# Patient Record
Sex: Female | Born: 1990 | Race: Black or African American | Hispanic: No | Marital: Single | Smoking: Never smoker
Health system: Southern US, Community
[De-identification: ages and names within clinical notes are randomized; demographics above are authoritative.]

## PROBLEM LIST (undated history)

## (undated) ENCOUNTER — Inpatient Hospital Stay (HOSPITAL_COMMUNITY): Payer: Self-pay

## (undated) ENCOUNTER — Inpatient Hospital Stay (HOSPITAL_COMMUNITY): Payer: Medicaid Other

## (undated) DIAGNOSIS — Z9889 Other specified postprocedural states: Secondary | ICD-10-CM

## (undated) DIAGNOSIS — E119 Type 2 diabetes mellitus without complications: Secondary | ICD-10-CM

## (undated) DIAGNOSIS — Z202 Contact with and (suspected) exposure to infections with a predominantly sexual mode of transmission: Secondary | ICD-10-CM

## (undated) DIAGNOSIS — O24419 Gestational diabetes mellitus in pregnancy, unspecified control: Secondary | ICD-10-CM

## (undated) DIAGNOSIS — R519 Headache, unspecified: Secondary | ICD-10-CM

## (undated) DIAGNOSIS — A749 Chlamydial infection, unspecified: Secondary | ICD-10-CM

## (undated) DIAGNOSIS — R51 Headache: Secondary | ICD-10-CM

## (undated) DIAGNOSIS — K5909 Other constipation: Secondary | ICD-10-CM

## (undated) DIAGNOSIS — D649 Anemia, unspecified: Secondary | ICD-10-CM

## (undated) DIAGNOSIS — F458 Other somatoform disorders: Secondary | ICD-10-CM

## (undated) DIAGNOSIS — F32A Depression, unspecified: Secondary | ICD-10-CM

## (undated) DIAGNOSIS — O139 Gestational [pregnancy-induced] hypertension without significant proteinuria, unspecified trimester: Secondary | ICD-10-CM

## (undated) DIAGNOSIS — I10 Essential (primary) hypertension: Secondary | ICD-10-CM

## (undated) DIAGNOSIS — K219 Gastro-esophageal reflux disease without esophagitis: Secondary | ICD-10-CM

## (undated) DIAGNOSIS — R569 Unspecified convulsions: Secondary | ICD-10-CM

## (undated) DIAGNOSIS — F419 Anxiety disorder, unspecified: Secondary | ICD-10-CM

## (undated) DIAGNOSIS — I499 Cardiac arrhythmia, unspecified: Secondary | ICD-10-CM

## (undated) DIAGNOSIS — F329 Major depressive disorder, single episode, unspecified: Secondary | ICD-10-CM

## (undated) DIAGNOSIS — R112 Nausea with vomiting, unspecified: Secondary | ICD-10-CM

## (undated) DIAGNOSIS — T8859XA Other complications of anesthesia, initial encounter: Secondary | ICD-10-CM

## (undated) DIAGNOSIS — F319 Bipolar disorder, unspecified: Secondary | ICD-10-CM

## (undated) DIAGNOSIS — D573 Sickle-cell trait: Secondary | ICD-10-CM

## (undated) DIAGNOSIS — B999 Unspecified infectious disease: Secondary | ICD-10-CM

## (undated) DIAGNOSIS — D219 Benign neoplasm of connective and other soft tissue, unspecified: Secondary | ICD-10-CM

## (undated) DIAGNOSIS — F909 Attention-deficit hyperactivity disorder, unspecified type: Secondary | ICD-10-CM

## (undated) HISTORY — DX: Type 2 diabetes mellitus without complications: E11.9

## (undated) HISTORY — PX: NASAL SEPTUM SURGERY: SHX37

## (undated) HISTORY — DX: Other constipation: K59.09

## (undated) HISTORY — PX: DILATION AND CURETTAGE OF UTERUS: SHX78

## (undated) HISTORY — PX: WISDOM TOOTH EXTRACTION: SHX21

---
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
## 2012-02-01 ENCOUNTER — Encounter (HOSPITAL_COMMUNITY): Payer: Self-pay | Admitting: *Deleted

## 2012-02-01 ENCOUNTER — Inpatient Hospital Stay (HOSPITAL_COMMUNITY)
Admission: AD | Admit: 2012-02-01 | Discharge: 2012-02-01 | Disposition: A | Discharge status: Hospice | Payer: Medicaid Other | Source: Ambulatory Visit | Attending: Obstetrics & Gynecology | Admitting: Obstetrics & Gynecology

## 2012-02-01 DIAGNOSIS — N949 Unspecified condition associated with female genital organs and menstrual cycle: Secondary | ICD-10-CM | POA: Insufficient documentation

## 2012-02-01 DIAGNOSIS — A5400 Gonococcal infection of lower genitourinary tract, unspecified: Secondary | ICD-10-CM

## 2012-02-01 DIAGNOSIS — R109 Unspecified abdominal pain: Secondary | ICD-10-CM | POA: Insufficient documentation

## 2012-02-01 DIAGNOSIS — A749 Chlamydial infection, unspecified: Secondary | ICD-10-CM

## 2012-02-01 DIAGNOSIS — N938 Other specified abnormal uterine and vaginal bleeding: Secondary | ICD-10-CM | POA: Insufficient documentation

## 2012-02-01 DIAGNOSIS — Z202 Contact with and (suspected) exposure to infections with a predominantly sexual mode of transmission: Secondary | ICD-10-CM | POA: Insufficient documentation

## 2012-02-01 HISTORY — DX: Contact with and (suspected) exposure to infections with a predominantly sexual mode of transmission: Z20.2

## 2012-02-01 LAB — POCT PREGNANCY, URINE: Preg Test, Ur: NEGATIVE

## 2012-02-01 LAB — CBC
HCT: 31.1 % — ABNORMAL LOW (ref 36.0–46.0)
Hemoglobin: 10.1 g/dL — ABNORMAL LOW (ref 12.0–15.0)
RBC: 3.91 MIL/uL (ref 3.87–5.11)
WBC: 7 10*3/uL (ref 4.0–10.5)

## 2012-02-01 LAB — URINALYSIS, ROUTINE W REFLEX MICROSCOPIC
Glucose, UA: 250 mg/dL — AB
Ketones, ur: 40 mg/dL — AB
Nitrite: POSITIVE — AB
Specific Gravity, Urine: 1.02 (ref 1.005–1.030)
pH: 5 (ref 5.0–8.0)

## 2012-02-01 LAB — URINE MICROSCOPIC-ADD ON

## 2012-02-01 MED ORDER — AZITHROMYCIN 250 MG PO TABS
1000.0000 mg | ORAL_TABLET | Freq: Once | ORAL | Status: AC
  Administered 2012-02-01: 1000 mg via ORAL
  Filled 2012-02-01: qty 4

## 2012-02-01 MED ORDER — CEFTRIAXONE SODIUM 250 MG IJ SOLR
250.0000 mg | Freq: Once | INTRAMUSCULAR | Status: AC
  Administered 2012-02-01: 250 mg via INTRAMUSCULAR
  Filled 2012-02-01: qty 250

## 2012-02-01 NOTE — MAU Provider Note (Signed)
History     CSN: [id number]  Arrival date and time: 02/01/12 1200   First Provider Initiated Contact with Patient 02/01/12 1320      Chief Complaint  Patient presents with  . Vaginal Bleeding  . Abdominal Pain   Vaginal Bleeding Associated symptoms include abdominal pain, nausea (decreased appetite) and vomiting (>1x daily, difficulty keeping food down). Pertinent negatives include no chills, constipation, diarrhea, dysuria, fever, frequency or headaches.  Abdominal Pain Associated symptoms include nausea (decreased appetite) and vomiting (>1x daily, difficulty keeping food down). Pertinent negatives include no constipation, diarrhea, dysuria, fever, frequency or headaches. Weight loss: +weight gain.    Stephanie Mack 21 yo F G1P0010 w/ significant psychiatric hx (followed by [facility]), currently sexually active and not on contraception, who presents with vaginal bleeding and passing clots.  1. Vaginal Bleeding - LMP about 1 mo ago. Started bleeding on 01/27/12 similar to regular period for about 3 days, followed passing heavy bleeding (soaked 5 pads per day) with large dark clots about the size of her fist. Bleeding is fairly constant, worsened by coughing, standing-up, and after sex. Menstrual history of periods w/ regular bleeding w/o clots x7 days.  2. Hx recent GC/Chlamydia - [facility]. Pt reports having sex since being treated.  3. OB Hx - Currently no contraception. Was on Depo > 20yr ago. 1 pregnancy resulted in miscarriage at 6 weeks w/ empty gestational sac, passed clots/products of conception.  ROS: Admits to +weakness, +dizziness, +lightheadedness, +nausea/vomiting (>1x daily) and unable to keep food down, +decreased appetite, +lower abd pain.  Denies any fever, chills, CP, SoB, dysuria, urinary frequency, constipation/diarrhea.    Past Medical History  Diagnosis Date  . Depression     depression  .  Bipolar disorder   . ADHD (attention deficit hyperactivity disorder)   . Anxiety   . Anemia   . ADHD (attention deficit hyperactivity disorder)   . Bipolar 1 disorder   . Chlamydia 05-31-10  . Pseudocyesis 2013    Seen in MAU for percieved FM, abd distension. Normal exam.   . Gonorrhea contact, treated     Past Surgical History  Procedure Date  . No past surgeries   . Nasal septum surgery     Family History  Problem Relation Age of Onset  . Kidney disease Mother   . Hypertension Father   . Cancer Sister   . Asthma Brother     History  Substance Use Topics  . Smoking status: Never Smoker   . Smokeless tobacco: Never Used  . Alcohol Use: No    Allergies: No Known Allergies  Prescriptions prior to admission  Medication Sig Dispense Refill  . acetaminophen (TYLENOL) 325 MG tablet Take 325 mg by mouth every 6 (six) hours as needed. For pain      . ARIPiprazole (ABILIFY) 2 MG tablet Take 2 mg by mouth 2 (two) times daily.       . mirtazapine (REMERON) 30 MG tablet Take 60 mg by mouth at bedtime.         Review of Systems  Constitutional: Positive for malaise/fatigue. Negative for fever and chills. Weight loss: +weight gain.  Eyes: Negative for blurred vision and double vision.  Respiratory: Negative for cough and shortness of breath.   Cardiovascular: Negative for chest pain.  Gastrointestinal: Positive for nausea (decreased appetite), vomiting (>1x daily, difficulty keeping food down) and abdominal pain. Negative for diarrhea and constipation.  Genitourinary: Positive for  vaginal bleeding. Negative for dysuria and frequency.  Neurological: Positive for dizziness (+lightheadedness) and weakness. Negative for headaches.   Physical Exam   Blood pressure 121/84, pulse 119, temperature 98.1 F (36.7 C), temperature source Oral, resp. rate 16, height 5\' 1"  (1.549 m), weight 47.809 kg (105 lb 6.4 oz), last menstrual period 01/02/2012, SpO2 99.00%.  Physical Exam  General  appearance: alert, cooperative and no distress Lungs: clear to auscultation bilaterally Heart: regular rate and rhythm, S1, S2 normal, no murmur, click, rub or gallop Abdomen: firm, mildly distended, tender to deep palpation RLQ, no rebound, Rovsing negative, Psoas negative Pelvic: cervix normal in appearance, external genitalia normal, no adnexal masses or tenderness, no cervical motion tenderness, rectovaginal septum normal, uterus normal size, shape, and consistency and vagina normal without discharge. No active bleeding seen. Small amount of blood pooled in vaginal vault, but no signs of bleeding from cervical os. No signs of vaginal trauma or lacerations.  Extremities: extremities normal, atraumatic, no cyanosis or edema Pulses: 2+ and symmetric Skin: Skin color, texture, turgor normal. No rashes or lesions  MAU Course  Procedures  MDM 21 yo F G1P0010, sexually active w/o contraception. Reports of bleeding/large clots. Urine pregnancy test negative. Pelvic exam showed no active bleeding. LMP about 1 month ago. Bleeding seems consistent with heavy menstrual period, does not appear to be excessive or abnormal.  Results for orders placed during the hospital encounter of 02/01/12 (from the past 24 hour(s))  URINALYSIS, ROUTINE W REFLEX MICROSCOPIC     Status: Abnormal   Collection Time   02/01/12 12:30 PM      Component Value Range   Color, Urine RED (*) YELLOW   APPearance CLOUDY (*) CLEAR   Specific Gravity, Urine 1.020  1.005 - 1.030   pH 5.0  5.0 - 8.0   Glucose, UA 250 (*) NEGATIVE mg/dL   Hgb urine dipstick LARGE (*) NEGATIVE   Bilirubin Urine NEGATIVE  NEGATIVE   Ketones, ur 40 (*) NEGATIVE mg/dL   Protein, ur >960 (*) NEGATIVE mg/dL   Urobilinogen, UA 4.0 (*) 0.0 - 1.0 mg/dL   Nitrite POSITIVE (*) NEGATIVE   Leukocytes, UA MODERATE (*) NEGATIVE  URINE MICROSCOPIC-ADD ON     Status: Normal   Collection Time   02/01/12 12:30 PM      Component Value Range   WBC, UA 0-2  <3  WBC/hpf   RBC / HPF TOO NUMEROUS TO COUNT  <3 RBC/hpf   Urine-Other MICROSCOPIC EXAM PERFORMED ON UNCONCENTRATED URINE    CBC     Status: Abnormal   Collection Time   02/01/12  2:05 PM      Component Value Range   WBC 7.0  4.0 - 10.5 K/uL   RBC 3.91  3.87 - 5.11 MIL/uL   Hemoglobin 10.1 (*) 12.0 - 15.0 g/dL   HCT 45.4 (*) 09.8 - 11.9 %   MCV 79.5  78.0 - 100.0 fL   MCH 25.8 (*) 26.0 - 34.0 pg   MCHC 32.5  30.0 - 36.0 g/dL   RDW 14.7  82.9 - 56.2 %   Platelets 286  150 - 400 K/uL  POCT PREGNANCY, URINE     Status: Normal   Collection Time   02/01/12  2:27 PM      Component Value Range   Preg Test, Ur NEGATIVE  NEGATIVE    Assessment and Plan   Stephanie Mack 62 yo F G1P0010 currently sexually active w/o contraception, who presents with vaginal bleeding and passing  clots.  1. Vaginal Bleeding - diff dx: likely regular menstrual cycle vs. Possible miscarriage vs. Possible re-infection GC/Chlamydia - Urine pregnancy negative. LMP about 1 month ago. - pelvic exam: showed no active bleeding. Some pooled blood, but no significant bleeding. Consistent with regular period  2. Abdominal Pain - diff dx: likely menstrual cramping vs. Ruled out ectopic pregnancy - urine pregnancy negative. See above - ibuprofen for pain  3. Hx GC/Chlamydia - treated w/ IM and pill for +Gonorrhea and +Chlyamdia 10/24 at [facility] - pt admits to sexual intercourse yesterday (6 days after completing treatment), and states that partner was not treated. Discussed importance of waiting at least 10 days after both her and partner have been treated - Rocephin IM - Azithromycin PO  4. Contraception: - currently no contraception. Hx of Depo IM >56yr ago - recommended f/u apt with [facility] to discuss birth control options  5.  Urine culture pending.  No complaint of dysuria.  Will treat if culture indicates.  Evaluation and management procedures were performed by the under my supervision  and collaboration. I have seen the client, reviewed the note and chart, and I agree with the management and plan.  Nolene Bernheim, RN, FNP   Harriet Masson 02/01/2012, 3:01 PM

## 2012-02-01 NOTE — MAU Provider Note (Signed)
Attestation of Attending Supervision of Advanced Practitioner (CNM/NP): Evaluation and management procedures were performed by the Advanced Practitioner under my supervision and collaboration.  I have reviewed the Advanced Practitioner's note and chart, and I agree with the management and plan.  Minervia Osso, MD, FACOG Attending Obstetrician & Gynecologist Faculty Practice, [facility]  

## 2012-02-01 NOTE — MAU Note (Signed)
Says she stopped depo one year ago and has not taken any birth control since then. Says she started bleeding with clots two days ago and last had intercourse yesterday.  She says she was in pain & bleeding during sex, and asked the boy to stop and he refused.

## 2012-02-01 NOTE — MAU Note (Signed)
Patient states she has not taken Depo for about one year. States she has only had one period since then. States she started bleeding on 10-28 and is bleeding heavy and passing large clots. Having lower abdominal pain. States she was treated for GC/CT on 10-24 at [facility].

## 2012-02-01 NOTE — MAU Note (Signed)
Pt attempted to obtain a urine sample, but said she was unable to get it into the cup

## 2012-04-23 ENCOUNTER — Inpatient Hospital Stay (HOSPITAL_COMMUNITY)
Admission: AD | Admit: 2012-04-23 | Discharge: 2012-04-23 | Disposition: A | Discharge status: Hospice | Payer: Medicaid Other | Source: Ambulatory Visit | Attending: Obstetrics & Gynecology | Admitting: Obstetrics & Gynecology

## 2012-04-23 ENCOUNTER — Encounter (HOSPITAL_COMMUNITY): Payer: Self-pay | Admitting: *Deleted

## 2012-04-23 DIAGNOSIS — R059 Cough, unspecified: Secondary | ICD-10-CM | POA: Insufficient documentation

## 2012-04-23 DIAGNOSIS — O99891 Other specified diseases and conditions complicating pregnancy: Secondary | ICD-10-CM | POA: Insufficient documentation

## 2012-04-23 DIAGNOSIS — J069 Acute upper respiratory infection, unspecified: Secondary | ICD-10-CM | POA: Insufficient documentation

## 2012-04-23 DIAGNOSIS — R05 Cough: Secondary | ICD-10-CM

## 2012-04-23 LAB — URINALYSIS, ROUTINE W REFLEX MICROSCOPIC
Glucose, UA: NEGATIVE mg/dL
Ketones, ur: 15 mg/dL — AB
Leukocytes, UA: NEGATIVE
Nitrite: NEGATIVE
Protein, ur: NEGATIVE mg/dL
Urobilinogen, UA: 0.2 mg/dL (ref 0.0–1.0)

## 2012-04-23 MED ORDER — SALINE NASAL SPRAY 0.65 % NA SOLN: 1.0000 | NASAL | Status: DC | PRN

## 2012-04-23 MED ORDER — GUAIFENESIN-CODEINE 100-10 MG/5ML PO SYRP: 5.0000 mL | ORAL_SOLUTION | Freq: Every evening | ORAL | Status: DC | PRN

## 2012-04-23 NOTE — MAU Note (Signed)
Chest hurts when she coughs, past couple. Threw up one time this monring.  Cough is non- productive.  Denies fever or sore throat.

## 2012-04-23 NOTE — MAU Note (Signed)
Mask on at front desk when registered.  Remains on.

## 2012-04-23 NOTE — MAU Note (Signed)
Pt started with a cough 4 days ago, and vomiting started this morning.  Says she is able to keep food down.  Denies any runny nose, sore throat, fever or diarrhea.  Says her chest hurts sometimes when she coughs.

## 2012-04-23 NOTE — MAU Provider Note (Signed)
Chief Complaint: No chief complaint on file.   First Provider Initiated Contact with Patient 04/23/12 1620     SUBJECTIVE HPI: Stephanie Mack is a 22 y.o. G1P0010 at [redacted]w[redacted]d who presents to maternity admissions reporting cough x2 days keeping her up at night.  She is unsure what medications are safe to take.  Others in her household have had cough and cold symptoms recently.  She has had one prenatal visit with Dr Clearance Coots and her due date is based on LMP.  She has a repeat U/S scheduled to verify her dating for the pregnancy.  She denies sore throat, fever/chills, runny/stuffy nose, h/a, urinary symptoms, vaginal bleeding or abdominal pain.      Past Medical History  Diagnosis Date  . Depression     depression  . Bipolar disorder   . ADHD (attention deficit hyperactivity disorder)   . Anxiety   . Anemia   . ADHD (attention deficit hyperactivity disorder)   . Bipolar 1 disorder   . Chlamydia 05-31-10  . Pseudocyesis 2013    Seen in MAU for percieved FM, abd distension. Normal exam.   . Gonorrhea contact, treated    Past Surgical History  Procedure Date  . No past surgeries   . Nasal septum surgery    History   Social History  . Marital Status: Single    Spouse Name: N/A    Number of Children: N/A  . Years of Education: N/A   Occupational History  . Not on file.   Social History Main Topics  . Smoking status: Never Smoker   . Smokeless tobacco: Never Used  . Alcohol Use: No  . Drug Use: No  . Sexually Active: Not Currently    Birth Control/ Protection: None     Comment: 1 partner   Other Topics Concern  . Not on file   Social History Narrative  . No narrative on file   No current facility-administered medications on file prior to encounter.   Current Outpatient Prescriptions on File Prior to Encounter  Medication Sig Dispense Refill  . acetaminophen (TYLENOL) 325 MG tablet Take 325 mg by mouth every 6 (six) hours as needed. For pain      . mirtazapine (REMERON)  30 MG tablet Take 60 mg by mouth at bedtime.       . ARIPiprazole (ABILIFY) 2 MG tablet Take 2 mg by mouth 2 (two) times daily.        No Known Allergies  ROS: Pertinent items in HPI  OBJECTIVE Blood pressure 116/10, pulse 101, temperature 98.2 F (36.8 C), resp. rate 18, height 5' 1.25" (1.556 m), weight 45.813 kg (101 lb), last menstrual period 02/22/2012, SpO2 100.00%. GENERAL: Well-developed, well-nourished female in no acute distress.  HEENT: Normocephalic HEART: normal rate/rhythm RESP: normal effort, lung sounds clear bilaterally in all lobes, no cough while in MAU ABDOMEN: Soft, non-tender EXTREMITIES: Nontender, no edema NEURO: Alert and oriented SPECULUM EXAM: deferred ASSESSMENT 1. URI (upper respiratory infection)     PLAN Discharge home Robitussin AC QHS PRN Saline Nasal Spray PRN Pt given list of safe medications to take in pregnancy Keep scheduled appointments with Dr Clearance Coots Return to MAU as needed    Medication List     As of 04/23/2012  4:27 PM    ASK your doctor about these medications         acetaminophen 325 MG tablet   Commonly known as: TYLENOL   Take 325 mg by mouth every 6 (six) hours as  needed. For pain      ARIPiprazole 2 MG tablet   Commonly known as: ABILIFY   Take 2 mg by mouth 2 (two) times daily.      mirtazapine 30 MG tablet   Commonly known as: REMERON   Take 60 mg by mouth at bedtime.      prenatal multivitamin Tabs   Take 1 tablet by mouth daily.         Sharen Counter Certified Nurse-Midwife 04/23/2012  4:27 PM

## 2012-05-24 ENCOUNTER — Other Ambulatory Visit (HOSPITAL_COMMUNITY): Payer: Self-pay | Admitting: Obstetrics

## 2012-05-24 DIAGNOSIS — R9389 Abnormal findings on diagnostic imaging of other specified body structures: Secondary | ICD-10-CM

## 2012-05-24 DIAGNOSIS — IMO0002 Reserved for concepts with insufficient information to code with codable children: Secondary | ICD-10-CM

## 2012-05-24 DIAGNOSIS — Z3682 Encounter for antenatal screening for nuchal translucency: Secondary | ICD-10-CM

## 2012-05-25 ENCOUNTER — Encounter: Payer: Self-pay | Admitting: *Deleted

## 2012-05-28 ENCOUNTER — Encounter (HOSPITAL_COMMUNITY): Payer: Self-pay | Admitting: Obstetrics

## 2012-06-05 ENCOUNTER — Ambulatory Visit (HOSPITAL_COMMUNITY)
Admission: RE | Admit: 2012-06-05 | Discharge: 2012-06-05 | Disposition: A | Discharge status: Home / Self Care | Payer: Medicaid Other | Source: Ambulatory Visit | Attending: Obstetrics | Admitting: Obstetrics

## 2012-06-05 ENCOUNTER — Other Ambulatory Visit: Payer: Self-pay

## 2012-06-05 DIAGNOSIS — IMO0002 Reserved for concepts with insufficient information to code with codable children: Secondary | ICD-10-CM

## 2012-06-05 DIAGNOSIS — O3510X0 Maternal care for (suspected) chromosomal abnormality in fetus, unspecified, not applicable or unspecified: Secondary | ICD-10-CM | POA: Insufficient documentation

## 2012-06-05 DIAGNOSIS — Z3682 Encounter for antenatal screening for nuchal translucency: Secondary | ICD-10-CM

## 2012-06-05 DIAGNOSIS — O351XX0 Maternal care for (suspected) chromosomal abnormality in fetus, not applicable or unspecified: Secondary | ICD-10-CM | POA: Insufficient documentation

## 2012-06-05 DIAGNOSIS — R9389 Abnormal findings on diagnostic imaging of other specified body structures: Secondary | ICD-10-CM

## 2012-06-05 DIAGNOSIS — Z3689 Encounter for other specified antenatal screening: Secondary | ICD-10-CM | POA: Insufficient documentation

## 2012-07-02 ENCOUNTER — Inpatient Hospital Stay (HOSPITAL_COMMUNITY)
Admission: AD | Admit: 2012-07-02 | Discharge: 2012-07-02 | Disposition: A | Discharge status: Home / Self Care | Payer: Medicaid Other | Source: Ambulatory Visit | Attending: Obstetrics & Gynecology | Admitting: Obstetrics & Gynecology

## 2012-07-02 DIAGNOSIS — O99891 Other specified diseases and conditions complicating pregnancy: Secondary | ICD-10-CM | POA: Insufficient documentation

## 2012-07-02 DIAGNOSIS — R109 Unspecified abdominal pain: Secondary | ICD-10-CM | POA: Insufficient documentation

## 2012-07-02 NOTE — MAU Note (Signed)
Pt left, informed admissions clerk, did not tell nursing staff.

## 2012-07-02 NOTE — MAU Note (Signed)
Pain on sides bilaterally & upper abdomen only when baby moves.  Denies bleeding or LOF.

## 2012-07-03 ENCOUNTER — Ambulatory Visit (INDEPENDENT_AMBULATORY_CARE_PROVIDER_SITE_OTHER): Payer: Medicaid Other | Admitting: Obstetrics

## 2012-07-03 VITALS — BP 116/77 | Temp 98.9°F | Wt 103.0 lb

## 2012-07-03 DIAGNOSIS — Z3400 Encounter for supervision of normal first pregnancy, unspecified trimester: Secondary | ICD-10-CM

## 2012-07-03 LAB — POCT URINALYSIS DIPSTICK
Glucose, UA: NEGATIVE
Ketones, UA: NEGATIVE
Spec Grav, UA: 1.01
Urobilinogen, UA: NEGATIVE

## 2012-07-03 NOTE — Progress Notes (Signed)
Doing well 

## 2012-07-03 NOTE — Progress Notes (Signed)
Pulse-78 Pt c/o constipation.

## 2012-07-04 LAB — AFP, QUAD SCREEN
Curr Gest Age: 16.2 wks.days
INH: 302.5 pg/mL
MoM for AFP: 0.89
Trisomy 18 (Edward) Syndrome Interp.: 1:2390 {titer}
uE3 Value: 0.5 ng/mL

## 2012-07-09 ENCOUNTER — Inpatient Hospital Stay (HOSPITAL_COMMUNITY)
Admission: AD | Admit: 2012-07-09 | Discharge: 2012-07-09 | Disposition: A | Discharge status: Home / Self Care | Payer: Medicaid Other | Source: Ambulatory Visit | Attending: Obstetrics & Gynecology | Admitting: Obstetrics & Gynecology

## 2012-07-09 ENCOUNTER — Encounter (HOSPITAL_COMMUNITY): Payer: Self-pay

## 2012-07-09 DIAGNOSIS — O99891 Other specified diseases and conditions complicating pregnancy: Secondary | ICD-10-CM | POA: Insufficient documentation

## 2012-07-09 DIAGNOSIS — O9989 Other specified diseases and conditions complicating pregnancy, childbirth and the puerperium: Secondary | ICD-10-CM

## 2012-07-09 DIAGNOSIS — R109 Unspecified abdominal pain: Secondary | ICD-10-CM | POA: Insufficient documentation

## 2012-07-09 DIAGNOSIS — N949 Unspecified condition associated with female genital organs and menstrual cycle: Secondary | ICD-10-CM

## 2012-07-09 DIAGNOSIS — O26899 Other specified pregnancy related conditions, unspecified trimester: Secondary | ICD-10-CM

## 2012-07-09 LAB — URINALYSIS, ROUTINE W REFLEX MICROSCOPIC
Glucose, UA: NEGATIVE mg/dL
Leukocytes, UA: NEGATIVE
Nitrite: NEGATIVE
Protein, ur: NEGATIVE mg/dL

## 2012-07-09 NOTE — MAU Note (Signed)
Patient arrived by EMS. States she had a sudden abdominal pain but has not had any pain since that time. Denies bleeding, vaginal discharge, no leaking. No nausea or vomiting.

## 2012-07-09 NOTE — MAU Provider Note (Signed)
History     CSN: [id number]  Arrival date and time: 07/09/12 1624   First Provider Initiated Contact with Patient 07/09/12 1801      Chief Complaint  Patient presents with  . Abdominal Pain   HPI This is a 22 y.o. female at [redacted]w[redacted]d who presents via EMS with c/o a one-time sharp pain in lower abdomen this morning after having sex. There were no other pains after that. No bleeding. Talked to her mother who was at work who told her to take an ambulance here. Has no complaints now.   Care is done at [facility].  Was last there April 1st.   RN Note: Patient arrived by EMS. States she had a sudden abdominal pain but has not had any pain since that time. Denies bleeding, vaginal discharge, no leaking. No nausea or vomiting.       OB History   Grav Para Term Preterm Abortions TAB SAB Ect Mult Living   2 0 0 0 1 0 1 0 1 0       Past Medical History  Diagnosis Date  . Depression     depression  . Bipolar disorder   . ADHD (attention deficit hyperactivity disorder)   . Anxiety   . Anemia   . ADHD (attention deficit hyperactivity disorder)   . Bipolar 1 disorder   . Chlamydia 05-31-10  . Pseudocyesis 2013    Seen in MAU for percieved FM, abd distension. Normal exam.   . Gonorrhea contact, treated     Past Surgical History  Procedure Laterality Date  . Nasal septum surgery      Family History  Problem Relation Age of Onset  . Kidney disease Mother   . Hypertension Father   . Cancer Sister   . Asthma Brother     History  Substance Use Topics  . Smoking status: Never Smoker   . Smokeless tobacco: Never Used  . Alcohol Use: No    Allergies: No Known Allergies  Prescriptions prior to admission  Medication Sig Dispense Refill  . Prenatal Vit-Fe Fumarate-FA (PRENATAL MULTIVITAMIN) TABS Take 1 tablet by mouth daily.      . sodium chloride (OCEAN NASAL SPRAY) 0.65 % nasal spray Place 1 spray into the nose as needed for congestion.  45 mL  3    Review of Systems   Constitutional: Negative for fever, chills and malaise/fatigue.  Gastrointestinal: Negative for nausea, vomiting, abdominal pain, diarrhea and constipation.  Genitourinary: Negative for dysuria.  Musculoskeletal: Negative for myalgias and back pain.  Neurological: Negative for dizziness, weakness and headaches.   Physical Exam   Blood pressure 125/72, pulse 100, temperature 98.1 F (36.7 C), temperature source Oral, resp. rate 16, height 5\' 1"  (1.549 m), weight 104 lb 3.2 oz (47.265 kg), last menstrual period 03/07/2012, SpO2 100.00%.  Physical Exam  Constitutional: She is oriented to person, place, and time. She appears well-developed and well-nourished. No distress.  HENT:  Head: Normocephalic.  Cardiovascular: Normal rate.   Respiratory: Effort normal.  GI: Soft. She exhibits no distension and no mass. There is no tenderness. There is no rebound and no guarding.  Genitourinary: Vagina normal and uterus normal. No vaginal discharge found.  Cervix long and closed Uterus nontender  Musculoskeletal: Normal range of motion.  Neurological: She is alert and oriented to person, place, and time.  Skin: Skin is warm and dry.  Psychiatric: She has a normal mood and affect.   + Fetal heart tones in triage  MAU Course  Procedures  Assessment and Plan  A:  SIUP at [redacted]w[redacted]d       Round ligament pain      Reassuring fetal status and closed cervix  P:  Discharge        Discussed RLP and expectations of second trimester       PTL precautions reviewed       Followup with DR Tamela Oddi  [facility] 07/09/2012, 6:05 PM

## 2012-07-11 ENCOUNTER — Encounter: Payer: Medicaid Other | Admitting: Obstetrics

## 2012-07-17 ENCOUNTER — Other Ambulatory Visit: Payer: Self-pay | Admitting: Obstetrics

## 2012-07-17 ENCOUNTER — Other Ambulatory Visit: Payer: Self-pay | Admitting: Radiology

## 2012-07-17 DIAGNOSIS — Z3400 Encounter for supervision of normal first pregnancy, unspecified trimester: Secondary | ICD-10-CM

## 2012-07-17 DIAGNOSIS — Z363 Encounter for antenatal screening for malformations: Secondary | ICD-10-CM

## 2012-07-27 ENCOUNTER — Encounter: Payer: Self-pay | Admitting: Obstetrics

## 2012-07-31 ENCOUNTER — Ambulatory Visit (INDEPENDENT_AMBULATORY_CARE_PROVIDER_SITE_OTHER): Payer: Medicaid Other | Admitting: Obstetrics

## 2012-07-31 ENCOUNTER — Encounter: Payer: Self-pay | Admitting: Obstetrics

## 2012-07-31 ENCOUNTER — Encounter: Payer: Medicaid Other | Admitting: Obstetrics

## 2012-07-31 ENCOUNTER — Ambulatory Visit (INDEPENDENT_AMBULATORY_CARE_PROVIDER_SITE_OTHER): Payer: Medicaid Other

## 2012-07-31 VITALS — BP 136/88 | Temp 96.9°F | Wt 106.0 lb

## 2012-07-31 DIAGNOSIS — Z363 Encounter for antenatal screening for malformations: Secondary | ICD-10-CM

## 2012-07-31 DIAGNOSIS — Z3402 Encounter for supervision of normal first pregnancy, second trimester: Secondary | ICD-10-CM

## 2012-07-31 DIAGNOSIS — Z1389 Encounter for screening for other disorder: Secondary | ICD-10-CM

## 2012-07-31 DIAGNOSIS — Z3400 Encounter for supervision of normal first pregnancy, unspecified trimester: Secondary | ICD-10-CM

## 2012-07-31 LAB — POCT URINALYSIS DIPSTICK
Bilirubin, UA: NEGATIVE
Nitrite, UA: NEGATIVE
Urobilinogen, UA: NEGATIVE
pH, UA: 5

## 2012-07-31 LAB — US OB DETAIL + 14 WK

## 2012-07-31 NOTE — Progress Notes (Signed)
Pulse-80 No complaints.  

## 2012-08-01 ENCOUNTER — Encounter: Payer: Self-pay | Admitting: Obstetrics

## 2012-08-08 ENCOUNTER — Inpatient Hospital Stay (HOSPITAL_COMMUNITY)
Admission: AD | Admit: 2012-08-08 | Discharge: 2012-08-08 | Disposition: A | Discharge status: Home / Self Care | Payer: Medicaid Other | Source: Ambulatory Visit | Attending: Obstetrics | Admitting: Obstetrics

## 2012-08-08 ENCOUNTER — Encounter (HOSPITAL_COMMUNITY): Payer: Self-pay

## 2012-08-08 DIAGNOSIS — O26899 Other specified pregnancy related conditions, unspecified trimester: Secondary | ICD-10-CM

## 2012-08-08 DIAGNOSIS — M545 Low back pain, unspecified: Secondary | ICD-10-CM | POA: Insufficient documentation

## 2012-08-08 DIAGNOSIS — B9689 Other specified bacterial agents as the cause of diseases classified elsewhere: Secondary | ICD-10-CM | POA: Insufficient documentation

## 2012-08-08 DIAGNOSIS — A499 Bacterial infection, unspecified: Secondary | ICD-10-CM | POA: Insufficient documentation

## 2012-08-08 DIAGNOSIS — O2390 Unspecified genitourinary tract infection in pregnancy, unspecified trimester: Principal | ICD-10-CM | POA: Insufficient documentation

## 2012-08-08 DIAGNOSIS — R109 Unspecified abdominal pain: Secondary | ICD-10-CM

## 2012-08-08 DIAGNOSIS — N760 Acute vaginitis: Secondary | ICD-10-CM | POA: Insufficient documentation

## 2012-08-08 LAB — URINALYSIS, ROUTINE W REFLEX MICROSCOPIC
Glucose, UA: NEGATIVE mg/dL
Nitrite: NEGATIVE
Specific Gravity, Urine: 1.025 (ref 1.005–1.030)
pH: 6 (ref 5.0–8.0)

## 2012-08-08 LAB — URINE MICROSCOPIC-ADD ON

## 2012-08-08 LAB — WET PREP, GENITAL
Trich, Wet Prep: NONE SEEN
Yeast Wet Prep HPF POC: NONE SEEN

## 2012-08-08 MED ORDER — METRONIDAZOLE 500 MG PO TABS: 500.0000 mg | ORAL_TABLET | Freq: Two times a day (BID) | ORAL | Status: DC

## 2012-08-08 MED ORDER — ACETAMINOPHEN 325 MG PO TABS
650.0000 mg | ORAL_TABLET | Freq: Once | ORAL | Status: AC
  Administered 2012-08-08: 650 mg via ORAL
  Filled 2012-08-08: qty 2

## 2012-08-08 NOTE — MAU Note (Signed)
Patient is brought in by ems with c/o lower back and abdominal pain that started today. She denies vaginal bleeding, lof or dysuria. She reports good fetal movement.

## 2012-08-08 NOTE — MAU Provider Note (Signed)
History     CSN: [id number]  Arrival date and time: 08/08/12 1256   First Provider Initiated Contact with Patient 08/08/12 1319      Chief Complaint  Patient presents with  . Back Pain  . Abdominal Pain   HPI  Stephanie Mack is 22 y.o. G2P0010 [redacted]w[redacted]d weeks presenting with lower abdominal and lower back pain that began at 8am.  Sharp pain that is intermittent. Worst pain 9/10, 8/10.  She tried to lay on her side, put leg up without relief.  Did not try medication.   Patient of Dr. Verdell Carmine last seen in the office 4/29.  Called them today and instructed to come here. Denies nausea, vomiting, chills or fever,abnormal discharge or bleeding.  Last bowel movement yesterday normal for her.  Has felt the baby move today.   She appears comfortable at this time.    Past Medical History  Diagnosis Date  . Depression     depression  . Bipolar disorder   . ADHD (attention deficit hyperactivity disorder)   . Anxiety   . Anemia   . ADHD (attention deficit hyperactivity disorder)   . Bipolar 1 disorder   . Chlamydia 05-31-10  . Pseudocyesis 2013    Seen in MAU for percieved FM, abd distension. Normal exam.   . Gonorrhea contact, treated     Past Surgical History  Procedure Laterality Date  . Nasal septum surgery      Family History  Problem Relation Age of Onset  . Kidney disease Mother   . Hypertension Father   . Cancer Sister   . Asthma Brother     History  Substance Use Topics  . Smoking status: Never Smoker   . Smokeless tobacco: Never Used  . Alcohol Use: No    Allergies: No Known Allergies  Prescriptions prior to admission  Medication Sig Dispense Refill  . Prenatal Vit-Fe Fumarate-FA (PRENATAL MULTIVITAMIN) TABS Take 1 tablet by mouth daily.        Review of Systems  Constitutional: Negative for fever and chills.  Gastrointestinal: Positive for abdominal pain (lower abdominal pain). Negative for nausea and vomiting.  Genitourinary:       Neg for vaginal bleeding  or discharge.  Musculoskeletal: Positive for back pain (lower back pain).  Neurological: Negative for headaches.   Physical Exam   Blood pressure 140/94, pulse 72, temperature 97.5 F (36.4 C), temperature source Oral, resp. rate 16, height 5' (1.524 m), last menstrual period 03/07/2012, SpO2 100.00%.  Physical Exam  Constitutional: She is oriented to person, place, and time. She appears well-developed and well-nourished. No distress.  HENT:  Head: Normocephalic.  Neck: Normal range of motion.  Cardiovascular: Normal rate.   Respiratory: Effort normal.  GI: Soft. She exhibits no distension and no mass. There is tenderness (mild tenderness over the lower abdomen). There is no rebound and no guarding.  Genitourinary: There is no rash, tenderness or lesion on the right labia. There is no rash, tenderness or lesion on the left labia. Uterus is enlarged (1 cm below umbilicus). Uterus is not tender. Cervix exhibits no motion tenderness, no discharge and no friability. No erythema, tenderness or bleeding around the vagina. Vaginal discharge (large amount of frothy, maldodorous discharge) found.  Neurological: She is alert and oriented to person, place, and time.  Skin: Skin is warm and dry.  Psychiatric: She has a normal mood and affect. Her behavior is normal.   FHR 165  Results for orders placed during the hospital encounter of  08/08/12 (from the past 24 hour(s))  URINALYSIS, ROUTINE W REFLEX MICROSCOPIC     Status: Abnormal   Collection Time    08/08/12  1:00 PM      Result Value Range   Color, Urine YELLOW  YELLOW   APPearance CLOUDY (*) CLEAR   Specific Gravity, Urine 1.025  1.005 - 1.030   pH 6.0  5.0 - 8.0   Glucose, UA NEGATIVE  NEGATIVE mg/dL   Hgb urine dipstick TRACE (*) NEGATIVE   Bilirubin Urine NEGATIVE  NEGATIVE   Ketones, ur NEGATIVE  NEGATIVE mg/dL   Protein, ur NEGATIVE  NEGATIVE mg/dL   Urobilinogen, UA 0.2  0.0 - 1.0 mg/dL   Nitrite NEGATIVE  NEGATIVE    Leukocytes, UA SMALL (*) NEGATIVE  URINE MICROSCOPIC-ADD ON     Status: Abnormal   Collection Time    08/08/12  1:00 PM      Result Value Range   Squamous Epithelial / LPF MANY (*) RARE   WBC, UA 3-6  <3 WBC/hpf   Bacteria, UA MANY (*) RARE  WET PREP, GENITAL     Status: Abnormal   Collection Time    08/08/12  1:50 PM      Result Value Range   Yeast Wet Prep HPF POC NONE SEEN  NONE SEEN   Trich, Wet Prep NONE SEEN  NONE SEEN   Clue Cells Wet Prep HPF POC FEW (*) NONE SEEN   WBC, Wet Prep HPF POC FEW (*) NONE SEEN    MAU Course  Procedures  GC./CHL culture in the office was negative per patient report                        Urine culture pending  MDM Tylenol 650mg  po given. Patient is comfortable at the time of discharge, sitting on the bed talking with a friend.   Assessment and Plan  A:  Abdominal pain in second trimester      Round ligament pain     Bacterial vaginosis  P:  Rx for Flagyl 500mg  po bid X 1 day      Keep scheduled appointment with Dr. Clearance Coots     Call his office in 2 days to check on urine culture  Bricen Victory,EVE M 08/08/2012, 2:35 PM

## 2012-08-09 LAB — URINE CULTURE

## 2012-08-30 ENCOUNTER — Ambulatory Visit (INDEPENDENT_AMBULATORY_CARE_PROVIDER_SITE_OTHER): Payer: Medicaid Other | Admitting: Obstetrics

## 2012-08-30 ENCOUNTER — Other Ambulatory Visit: Payer: Self-pay | Admitting: Obstetrics

## 2012-08-30 ENCOUNTER — Inpatient Hospital Stay (HOSPITAL_COMMUNITY)
Admission: AD | Admit: 2012-08-30 | Discharge: 2012-09-05 | DRG: 765 | Disposition: A | Discharge status: Home / Self Care | Payer: Medicaid Other | Source: Ambulatory Visit | Attending: Obstetrics | Admitting: Obstetrics

## 2012-08-30 ENCOUNTER — Encounter (HOSPITAL_COMMUNITY): Payer: Self-pay

## 2012-08-30 ENCOUNTER — Encounter (HOSPITAL_COMMUNITY)
Admit: 2012-08-30 | Discharge: 2012-08-30 | Disposition: A | Discharge status: Home / Self Care | Payer: Medicaid Other | Attending: Obstetrics | Admitting: Obstetrics

## 2012-08-30 ENCOUNTER — Other Ambulatory Visit: Payer: Medicaid Other | Admitting: *Deleted

## 2012-08-30 VITALS — BP 176/111 | Temp 97.7°F | Wt 112.0 lb

## 2012-08-30 DIAGNOSIS — Z98891 History of uterine scar from previous surgery: Secondary | ICD-10-CM

## 2012-08-30 DIAGNOSIS — O1410 Severe pre-eclampsia, unspecified trimester: Secondary | ICD-10-CM | POA: Diagnosis present

## 2012-08-30 DIAGNOSIS — O1412 Severe pre-eclampsia, second trimester: Secondary | ICD-10-CM

## 2012-08-30 DIAGNOSIS — O321XX0 Maternal care for breech presentation, not applicable or unspecified: Secondary | ICD-10-CM | POA: Diagnosis present

## 2012-08-30 DIAGNOSIS — Z3402 Encounter for supervision of normal first pregnancy, second trimester: Secondary | ICD-10-CM

## 2012-08-30 DIAGNOSIS — Z3689 Encounter for other specified antenatal screening: Secondary | ICD-10-CM

## 2012-08-30 DIAGNOSIS — O365990 Maternal care for other known or suspected poor fetal growth, unspecified trimester, not applicable or unspecified: Secondary | ICD-10-CM | POA: Diagnosis present

## 2012-08-30 DIAGNOSIS — O1414 Severe pre-eclampsia complicating childbirth: Principal | ICD-10-CM | POA: Diagnosis present

## 2012-08-30 DIAGNOSIS — Z3480 Encounter for supervision of other normal pregnancy, unspecified trimester: Secondary | ICD-10-CM

## 2012-08-30 DIAGNOSIS — Z3400 Encounter for supervision of normal first pregnancy, unspecified trimester: Secondary | ICD-10-CM

## 2012-08-30 HISTORY — DX: Gestational (pregnancy-induced) hypertension without significant proteinuria, unspecified trimester: O13.9

## 2012-08-30 LAB — CBC
HCT: 34.8 % — ABNORMAL LOW (ref 36.0–46.0)
Hemoglobin: 11.8 g/dL — ABNORMAL LOW (ref 12.0–15.0)
MCH: 26.7 pg (ref 26.0–34.0)
MCHC: 33.9 g/dL (ref 30.0–36.0)
MCV: 80.8 fL (ref 78.0–100.0)
Platelets: 149 10*3/uL — ABNORMAL LOW (ref 150–400)
RDW: 14 % (ref 11.5–15.5)
RDW: 15.3 % (ref 11.5–15.5)
WBC: 6.5 10*3/uL (ref 4.0–10.5)

## 2012-08-30 LAB — URINALYSIS, ROUTINE W REFLEX MICROSCOPIC
Bilirubin Urine: NEGATIVE
Nitrite: NEGATIVE
Specific Gravity, Urine: >1.03 — ABNORMAL HIGH (ref 1.005–1.030)
pH: 6 (ref 5.0–8.0)

## 2012-08-30 LAB — PROTEIN / CREATININE RATIO, URINE
Protein Creatinine Ratio: 9.35 — ABNORMAL HIGH (ref 0.00–0.15)
Total Protein, Urine: 1405.8 mg/dL

## 2012-08-30 LAB — COMPREHENSIVE METABOLIC PANEL
ALT: 29 U/L (ref 0–35)
AST: 27 U/L (ref 0–37)
Albumin: 2.7 g/dL — ABNORMAL LOW (ref 3.5–5.2)
Chloride: 105 mEq/L (ref 96–112)
Creatinine, Ser: 0.58 mg/dL (ref 0.50–1.10)
Sodium: 138 mEq/L (ref 135–145)
Total Bilirubin: 0.2 mg/dL — ABNORMAL LOW (ref 0.3–1.2)

## 2012-08-30 LAB — HIV ANTIBODY (ROUTINE TESTING W REFLEX): HIV: NONREACTIVE

## 2012-08-30 LAB — POCT URINALYSIS DIPSTICK
Ketones, UA: NEGATIVE
Urobilinogen, UA: NEGATIVE
pH, UA: 5

## 2012-08-30 LAB — URIC ACID: Uric Acid, Serum: 4.6 mg/dL (ref 2.4–7.0)

## 2012-08-30 MED ORDER — LABETALOL HCL 5 MG/ML IV SOLN
40.0000 mg | Freq: Once | INTRAVENOUS | Status: AC
  Administered 2012-08-30: 40 mg via INTRAVENOUS
  Filled 2012-08-30: qty 8

## 2012-08-30 MED ORDER — LABETALOL HCL 300 MG PO TABS
300.0000 mg | ORAL_TABLET | Freq: Three times a day (TID) | ORAL | Status: DC
  Administered 2012-08-30 – 2012-09-03 (×12): 300 mg via ORAL
  Filled 2012-08-30 (×16): qty 1

## 2012-08-30 MED ORDER — LABETALOL HCL 5 MG/ML IV SOLN: 20.0000 mg | Freq: Once | INTRAVENOUS | Status: AC

## 2012-08-30 MED ORDER — CALCIUM CARBONATE ANTACID 500 MG PO CHEW: 2.0000 | CHEWABLE_TABLET | ORAL | Status: DC | PRN

## 2012-08-30 MED ORDER — ZOLPIDEM TARTRATE 5 MG PO TABS: 5.0000 mg | ORAL_TABLET | Freq: Every evening | ORAL | Status: DC | PRN

## 2012-08-30 MED ORDER — MAGNESIUM SULFATE 40 MG/ML IJ SOLN
4.0000 g | INTRAMUSCULAR | Status: DC
  Administered 2012-08-30: 4 g via INTRAVENOUS
  Filled 2012-08-30: qty 100

## 2012-08-30 MED ORDER — MAGNESIUM SULFATE 40 G IN LACTATED RINGERS - SIMPLE
2.0000 g/h | INTRAVENOUS | Status: DC
  Administered 2012-08-30 – 2012-09-02 (×4): 2 g/h via INTRAVENOUS
  Filled 2012-08-30 (×5): qty 500

## 2012-08-30 MED ORDER — PRENATAL MULTIVITAMIN CH
1.0000 | ORAL_TABLET | Freq: Every day | ORAL | Status: DC
  Administered 2012-08-30 – 2012-08-31 (×2): 1 via ORAL
  Filled 2012-08-30 (×2): qty 1

## 2012-08-30 MED ORDER — LABETALOL HCL 5 MG/ML IV SOLN
20.0000 mg | Freq: Once | INTRAVENOUS | Status: AC
  Administered 2012-08-30: 20 mg via INTRAVENOUS
  Filled 2012-08-30: qty 4

## 2012-08-30 MED ORDER — LACTATED RINGERS IV SOLN
INTRAVENOUS | Status: DC
  Administered 2012-08-30 (×2): via INTRAVENOUS
  Administered 2012-08-31: 100 mL/h via INTRAVENOUS
  Administered 2012-08-31: 17:00:00 via INTRAVENOUS

## 2012-08-30 MED ORDER — DOCUSATE SODIUM 100 MG PO CAPS
100.0000 mg | ORAL_CAPSULE | Freq: Every day | ORAL | Status: DC
  Administered 2012-08-31: 100 mg via ORAL
  Filled 2012-08-30 (×3): qty 1

## 2012-08-30 MED ORDER — ACETAMINOPHEN 325 MG PO TABS: 650.0000 mg | ORAL_TABLET | ORAL | Status: DC | PRN

## 2012-08-30 MED ORDER — LABETALOL HCL 100 MG PO TABS
100.0000 mg | ORAL_TABLET | Freq: Three times a day (TID) | ORAL | Status: DC
  Administered 2012-08-30: 100 mg via ORAL
  Filled 2012-08-30 (×4): qty 1

## 2012-08-30 MED ORDER — MAGNESIUM SULFATE BOLUS VIA INFUSION
4.0000 g | Freq: Once | INTRAVENOUS | Status: AC
Start: 2012-08-30 — End: 2012-08-30
  Filled 2012-08-30: qty 500

## 2012-08-30 MED ORDER — LABETALOL HCL 5 MG/ML IV SOLN
INTRAVENOUS | Status: AC
  Administered 2012-08-30: 20 mg via INTRAVENOUS
  Filled 2012-08-30: qty 4

## 2012-08-30 MED ORDER — BETAMETHASONE SOD PHOS & ACET 6 (3-3) MG/ML IJ SUSP
12.0000 mg | INTRAMUSCULAR | Status: AC
  Administered 2012-08-30 – 2012-08-31 (×2): 12 mg via INTRAMUSCULAR
  Filled 2012-08-30 (×2): qty 2

## 2012-08-30 NOTE — MAU Note (Signed)
Late Entry: Dr. Clearance Coots notified of pt's bp's, lab results, orders to admit to ante, mag 4gm bolus then 2gm per hour. No u/c's noted, efm monitor-variables noted. Okay to take monitors off and monitor efm q shift.

## 2012-08-30 NOTE — MAU Note (Signed)
Patient sent from the office for evaluation of elevated blood pressure, headache and proteinuria.

## 2012-08-30 NOTE — MAU Note (Signed)
Dr. Clearance Coots notified LS were clear at start of bolus of Magnesium, however slight wheeze noted bilaterally with 9ml Magnesium left of . BP's reviewed. Order to restart bolus and continue with prior orders. Call MD if LS worsen.

## 2012-08-30 NOTE — MAU Provider Note (Signed)
  History     CSN: [id number]  Arrival date and time: 08/30/12 1032   First Provider Initiated Contact with Patient 08/30/12 1145      Chief Complaint  Patient presents with  . Hypertension   HPI  Pt is [redacted]w[redacted]d pregnant and was sent from Dr. Verdell Carmine office this morning with Bp170/120.  Pt sent here for PIH eval.  Pt has a headache and nauseated but not vomiting.  Pt last ate last night.   Past Medical History  Diagnosis Date  . Depression     depression  . Bipolar disorder   . ADHD (attention deficit hyperactivity disorder)   . Anxiety   . Anemia   . ADHD (attention deficit hyperactivity disorder)   . Bipolar 1 disorder   . Chlamydia 05-31-10  . Pseudocyesis 2013    Seen in MAU for percieved FM, abd distension. Normal exam.   . Gonorrhea contact, treated   . Pregnancy induced hypertension     Past Surgical History  Procedure Laterality Date  . Nasal septum surgery      Family History  Problem Relation Age of Onset  . Kidney disease Mother   . Hypertension Father   . Cancer Sister   . Asthma Brother     History  Substance Use Topics  . Smoking status: Never Smoker   . Smokeless tobacco: Never Used  . Alcohol Use: No    Allergies: No Known Allergies  Prescriptions prior to admission  Medication Sig Dispense Refill  . metroNIDAZOLE (FLAGYL) 500 MG tablet Take 500 mg by mouth 2 (two) times daily. 7 day supply, Not yet complete      . Prenatal Vit-Fe Fumarate-FA (PRENATAL MULTIVITAMIN) TABS Take 1 tablet by mouth daily.      . [DISCONTINUED] metroNIDAZOLE (FLAGYL) 500 MG tablet Take 1 tablet (500 mg total) by mouth 2 (two) times daily.  14 tablet  0    Review of Systems  Constitutional: Negative for fever and chills.  Eyes: Negative for double vision.  Gastrointestinal: Positive for abdominal pain. Negative for diarrhea and constipation.  Neurological: Positive for headaches.   Physical Exam   Last menstrual period 03/07/2012.  Physical Exam  Vitals  reviewed. Constitutional: She is oriented to person, place, and time. She appears well-developed and well-nourished.  tearful  HENT:  Head: Normocephalic.  Eyes: Pupils are equal, round, and reactive to light.  Neck: Normal range of motion. Neck supple.  Cardiovascular: Normal rate.   Severe hypertension  Musculoskeletal: Normal range of motion.  Neurological: She is alert and oriented to person, place, and time.  Skin: Skin is warm and dry.  Psychiatric:  tearful    MAU Course  Procedures Dr. Clearance Coots caring for pt and admitted    Assessment and Plan    Stephanie Mack 08/30/2012, 11:46 AM

## 2012-08-30 NOTE — MAU Note (Signed)
Pt sent from MD office for [facility] eval. Has headache that she rates 9/10. Denies bleeding or lof.

## 2012-08-30 NOTE — Consult Note (Signed)
Maternal Fetal Medicine Consultation  Requesting Provider(s): Coral Ceo, MD  Reason for consultation: suspected preeclampsia  HPI: Stephanie Mack is a 22 yo G3P0110 currently at 25 1/7 weeks admitted due to suspected preeclampsia.  Stephanie Mack was seen earlier today for a routine OB visit and was noted to have elevated blood pressures (diastolic blood pressures > 100) and 3+ protein on urine dip.  She was evaluated in MAU - noted to have a protein/creatinine ratio of 9.36 and severe range blood pressures.  She currently complains of a frontal headache (9/10) but denies visual changes or RUQ pain.  The fetus is active.  She is currently on Magnesium sulfate for seizure prophylaxis.  The patient's prenatal course has otherwise been unremarkable.  Her intake blood pressure was 124/81 and she denies a history of chronic hypertension.  OB History: OB History   Grav Para Term Preterm Abortions TAB SAB Ect Mult Living   3 1 0 1 1 0 1 0 1 0     reports early SAB ? Lose of twins at "5 months" - delivered spontaneously  PMH:  Past Medical History  Diagnosis Date  . Depression     depression  . Bipolar disorder   . ADHD (attention deficit hyperactivity disorder)   . Anxiety   . Anemia   . ADHD (attention deficit hyperactivity disorder)   . Bipolar 1 disorder   . Chlamydia 05-31-10  . Pseudocyesis 2013    Seen in MAU for percieved FM, abd distension. Normal exam.   . Gonorrhea contact, treated   . Pregnancy induced hypertension     PSH:  Past Surgical History  Procedure Laterality Date  . Nasal septum surgery     Meds: PNV.  Reports that she is not taking any medications for her Bipolar disease  Allergies: NKDA  FH: Reports a strong family history of hypertension (father's side)  Soc: denies ETOH, tobacco or illicit drug use  Review of Systems: no vaginal bleeding or cramping/contractions, no LOF, no nausea/vomiting. All other systems reviewed and are negative.  PE:  162/113, 175/117, 173/108, 164/118, 134/74, 149/100  GEN: well-appearing female ABD: gravid, NT Ext: neg edema.  1+ DTRs, no clonus  Labs: CBC    Component Value Date/Time   WBC 6.5 08/30/2012 1121   RBC 4.49 08/30/2012 1121   HGB 11.8* 08/30/2012 1121   HCT 36.3 08/30/2012 1121   PLT 149* 08/30/2012 1121   MCV 80.8 08/30/2012 1121   MCH 26.3 08/30/2012 1121   MCHC 32.5 08/30/2012 1121   RDW 14.0 08/30/2012 1121   LYMPHSABS 4.8 05/31/2010 2302   MONOABS 0.4 05/31/2010 2302   EOSABS 0.1 05/31/2010 2302   BASOSABS 0.0 05/31/2010 2302   CMP     Component Value Date/Time   NA 138 08/30/2012 1121   K 3.3* 08/30/2012 1121   CL 105 08/30/2012 1121   CO2 25 08/30/2012 1121   GLUCOSE 92 08/30/2012 1121   BUN 4* 08/30/2012 1121   CREATININE 0.58 08/30/2012 1121   CALCIUM 8.7 08/30/2012 1121   PROT 6.0 08/30/2012 1121   ALBUMIN 2.7* 08/30/2012 1121   AST 27 08/30/2012 1121   ALT 29 08/30/2012 1121   ALKPHOS 147* 08/30/2012 1121   BILITOT 0.2* 08/30/2012 1121   GFRNONAA >90 08/30/2012 1121   GFRAA >90 08/30/2012 1121      A/P: 1) Single IUP at 25 1/7 weeks         2) Severe preeclampsia- based on severe range blood pressures and 3+  protein on urine dip (Protein/Creat ratio > 9).  We briefly discussed preeclampsia and indications for delivery.  Recommend: 1) Begin Labetalol 300 mg q 8 hours.  She may require IV Labetalol or IV Hydralazine acutely for severe range blood pressures.  May titrate upward to maximum dose of 800 mg q 8 hours - would try to keep blood pressures in the 140/90 range if possible.  I would be hesitant to add a second agent - if she continues to have severe range blood pressures despite max dose Labetalol, would use this as an indication for delivery. 2) Betamethasone series 3) Ultrasound for growth and UA Dopplers (please schedule with MFM tomorrow) 4) NICU consult 5) Concur with Magnesium sulfate prophylaxis while clinical assessment is being made.  If a decision is made not to  move toward delivery tomorrow, would discontinue unless delivery is felt to be indicated or imminent. 6) Would administer pain meds for headaches.  If severe and persistent despite treatment, this may be an indication to move toward delivery. 7) Recommend pneumatic compression hose while on bedrest 8) Daily preeclampsia labs (at least initially); if stable, may decrease frequency to 2-3x/ week  Would move toward delivery for: 1) Severe range blood pressures (SBB>160/DBP > 105) despite max dose oral Labetolol 2) Thrombocytopenia < 100K 3) LFTs > 2x normal 4) Oliguria 5) Pulmonary edema 6) Non-reassuring fetal testing 7) Severe headaches that to not respond to narcotics     Thank you for the opportunity to be a part of the care of [employer]. Please contact our office if we can be of further assistance.   I spent approximately 30 minutes with this patient with over 50% of time spent in face-to-face counseling.  Alpha Gula, MD Maternal Fetal Medicine

## 2012-08-30 NOTE — Progress Notes (Signed)
Pulse: 82

## 2012-08-30 NOTE — H&P (Signed)
Stephanie Mack is a 22 y.o. female presenting for elevated BP. Maternal Medical History:  Reason for admission: 32 y o G3 P0110.  EDC 12-12-12.  Presented to office for routine prenatal visit.  C/O headache and some lightheadedness over past week.Had severely elevated BP's and 3+ proteinuria in office.  Patient sent to [facility] for further evaluation.  Fetal activity: Perceived fetal activity is normal.   Last perceived fetal movement was within the past hour.    Prenatal complications: no prenatal complications Prenatal Complications - Diabetes: none.    OB History   Grav Para Term Preterm Abortions TAB SAB Ect Mult Living   3 1 0 1 1 0 1 0 1 0      Past Medical History  Diagnosis Date  . Depression     depression  . Bipolar disorder   . ADHD (attention deficit hyperactivity disorder)   . Anxiety   . Anemia   . ADHD (attention deficit hyperactivity disorder)   . Bipolar 1 disorder   . Chlamydia 05-31-10  . Pseudocyesis 2013    Seen in MAU for percieved FM, abd distension. Normal exam.   . Gonorrhea contact, treated   . Pregnancy induced hypertension    Past Surgical History  Procedure Laterality Date  . Nasal septum surgery     Family History: family history includes Asthma in her brother; Cancer in her sister; Hypertension in her father; and Kidney disease in her mother. Social History:  reports that she has never smoked. She has never used smokeless tobacco. She reports that she does not drink alcohol or use illicit drugs.   Prenatal Transfer Tool  Maternal Diabetes: No Genetic Screening: Normal Maternal Ultrasounds/Referrals: Normal Fetal Ultrasounds or other Referrals:  None Maternal Substance Abuse:  No Significant Maternal Medications:  None Significant Maternal Lab Results:  None Other Comments:  None  Review of Systems  Neurological: Positive for dizziness and headaches.  All other systems reviewed and are negative.      Blood pressure 148/105, pulse 78,  temperature 98.1 F (36.7 C), temperature source Oral, resp. rate 18, height 5' (1.524 m), weight 112 lb (50.803 kg), last menstrual period 03/07/2012, SpO2 100.00%. Maternal Exam:  Abdomen: Patient reports no abdominal tenderness.   Physical Exam  Nursing note and vitals reviewed. Constitutional: She is oriented to person, place, and time. She appears well-developed and well-nourished.  HENT:  Head: Normocephalic and atraumatic.  Eyes: Conjunctivae are normal. Pupils are equal, round, and reactive to light.  Neck: Normal range of motion. Neck supple.  Cardiovascular: Normal rate and regular rhythm.   Respiratory: Effort normal and breath sounds normal.  GI: Soft.  Musculoskeletal: Normal range of motion.  Neurological: She is alert and oriented to person, place, and time.  Skin: Skin is warm and dry.  Psychiatric: She has a normal mood and affect. Her behavior is normal. Judgment and thought content normal.    Prenatal labs: ABO, Rh:   Antibody:   Rubella:   RPR:    HBsAg:    HIV:    GBS:     Assessment/Plan: 25 weeks.  PIH.  Admit.  Start Magnesium Sulfate seizure prophylaxis.  Betamethasone.  Bedrest.  24 hour urine.  MFM Consult.   HARPER,CHARLES A 08/30/2012, 4:03 PM

## 2012-08-31 ENCOUNTER — Encounter (HOSPITAL_COMMUNITY): Payer: Self-pay | Admitting: Obstetrics

## 2012-08-31 ENCOUNTER — Inpatient Hospital Stay (HOSPITAL_COMMUNITY): Payer: Medicaid Other

## 2012-08-31 LAB — CBC
HCT: 32.7 % — ABNORMAL LOW (ref 36.0–46.0)
Hemoglobin: 11 g/dL — ABNORMAL LOW (ref 12.0–15.0)
MCV: 79.8 fL (ref 78.0–100.0)
WBC: 16.4 10*3/uL — ABNORMAL HIGH (ref 4.0–10.5)

## 2012-08-31 LAB — COMPREHENSIVE METABOLIC PANEL
Alkaline Phosphatase: 135 U/L — ABNORMAL HIGH (ref 39–117)
BUN: 6 mg/dL (ref 6–23)
Chloride: 103 mEq/L (ref 96–112)
GFR calc Af Amer: >90 mL/min (ref >90)
GFR calc non Af Amer: >90 mL/min (ref >90)
Glucose, Bld: 97 mg/dL (ref 70–99)
Potassium: 3.5 mEq/L (ref 3.5–5.1)
Total Bilirubin: 0.2 mg/dL — ABNORMAL LOW (ref 0.3–1.2)

## 2012-08-31 LAB — CREATININE CLEARANCE, URINE, 24 HOUR: Urine Total Volume-CRCL: 1300 mL

## 2012-08-31 LAB — PROTEIN, URINE, 24 HOUR
Collection Interval-UPROT: 24 hours
Protein, 24H Urine: 4433 mg/d — ABNORMAL HIGH (ref 50–100)
Protein, Urine: 341 mg/dL

## 2012-08-31 LAB — URINE CULTURE: Colony Count: 45000

## 2012-08-31 LAB — LACTATE DEHYDROGENASE: LDH: 266 U/L — ABNORMAL HIGH (ref 94–250)

## 2012-08-31 LAB — GLUCOSE TOLERANCE, 2 HOURS W/ 1HR: Glucose, 2 hour: 92 mg/dL (ref 70–139)

## 2012-08-31 MED ORDER — POTASSIUM CHLORIDE CRYS ER 20 MEQ PO TBCR
40.0000 meq | EXTENDED_RELEASE_TABLET | Freq: Two times a day (BID) | ORAL | Status: AC
  Administered 2012-08-31: 40 meq via ORAL
  Filled 2012-08-31 (×2): qty 2

## 2012-08-31 NOTE — Progress Notes (Signed)
[place] is staying hopeful that all will go well.  We talked about her fears and her feeling overwhelmed about delivering tomorrow.  While we were there her cousin came to visit with her newborn baby.  She was reserved during the visit.   She will have good family support over night and tomorrow and she also spoke with her pastor on the phone.   She has requested that a chaplain be called tomorrow when she delivers.  I will notify the chaplain to give him a heads up, but please page as patient requests.  Pager, [phone]  Agnes Lawrence Libero Puthoff 4:12 PM   08/31/12 1600  Clinical Encounter Type  Visited With Patient  Visit Type Initial;Spiritual support  Referral From Nurse  Spiritual Encounters  Spiritual Needs Emotional

## 2012-08-31 NOTE — Progress Notes (Signed)
Dr Gaynell Face called to discuss POC. Pt to have scheduled C-Section tomorrow afternoon at approx. 5 pm for severe pre-eclampsia and IUGR. Marland Kitchen Patient and family made aware.Consents signed and patient to be NPO after 8 am.

## 2012-08-31 NOTE — Progress Notes (Signed)
UR completed 

## 2012-08-31 NOTE — Consult Note (Signed)
Neonatology Consult Note:  At the request of the patients obstetrician Dr. Clearance Coots I met with Ms. Burston  who is a 22 yo G3P0110 currently at 25 2/7 weeks.  She is being treated for severe preeclampsia with IV labetalol and Magnesium sulfate for seizure prophylaxis. The patient's prenatal course has otherwise been unremarkable.  She is due for a second dose of betamethasone today at 5 pm. Preliminary ultrasound results today showed lagging growth at 600g, oligohydramnios and both absent and reversal of end diastolic flow.  Little variability seen on monitoring. We discussed morbidity/mortality at this gestional age with overall baseline national survival of 72% however this infant appears to be at far greater risk of morbidity / mortality due to aforementioned findings.  We discussed delivery room resuscitation, including intubation and surfactant in DR.  Discussed likelihood of low heart rate in the delivery room and challenges related to preterm lungs.  Discussed mechanical ventilation and risk for chronic lung disease, risk for IVH with potential for motor / cognitive deficits, ROP, NEC, sepsis, as well as temperature instability and feeding immaturity.  Discussed NG / OG feeds, benefits of MBM in reducing incidence of NEC.   Discussed likely length of stay.  Thank you for allowing [place] to participate in her care.  Please call with questions.  John Giovanni, DO   Neonatologist The total length of face-to-face or floor / unit time for this encounter was 40 minutes.  Counseling and / or coordination of care was greater than fifty percent of the time and consisted of 25 minutes.

## 2012-09-01 ENCOUNTER — Encounter (HOSPITAL_COMMUNITY): Payer: Self-pay | Admitting: Anesthesiology

## 2012-09-01 ENCOUNTER — Inpatient Hospital Stay (HOSPITAL_COMMUNITY): Payer: Medicaid Other | Admitting: Anesthesiology

## 2012-09-01 ENCOUNTER — Encounter (HOSPITAL_COMMUNITY)
Admission: AD | Disposition: A | Discharge status: Home / Self Care | Payer: Self-pay | Source: Ambulatory Visit | Attending: Obstetrics

## 2012-09-01 ENCOUNTER — Encounter (HOSPITAL_COMMUNITY): Payer: Self-pay | Admitting: *Deleted

## 2012-09-01 DIAGNOSIS — O1414 Severe pre-eclampsia complicating childbirth: Secondary | ICD-10-CM

## 2012-09-01 LAB — CBC
HCT: 32.4 % — ABNORMAL LOW (ref 36.0–46.0)
MCH: 26.6 pg (ref 26.0–34.0)
MCHC: 32.9 g/dL (ref 30.0–36.0)
Platelets: 175 10*3/uL (ref 150–400)
Platelets: 170 10*3/uL (ref 150–400)
RBC: 4.02 MIL/uL (ref 3.87–5.11)
RDW: 14.5 % (ref 11.5–15.5)
RDW: 14.6 % (ref 11.5–15.5)
WBC: 18.1 10*3/uL — ABNORMAL HIGH (ref 4.0–10.5)

## 2012-09-01 LAB — COMPREHENSIVE METABOLIC PANEL
ALT: 27 U/L (ref 0–35)
AST: 45 U/L — ABNORMAL HIGH (ref 0–37)
Albumin: 2.1 g/dL — ABNORMAL LOW (ref 3.5–5.2)
Alkaline Phosphatase: 113 U/L (ref 39–117)
BUN: 7 mg/dL (ref 6–23)
CO2: 22 mEq/L (ref 19–32)
Calcium: 6.5 mg/dL — ABNORMAL LOW (ref 8.4–10.5)
Chloride: 104 mEq/L (ref 96–112)
GFR calc Af Amer: >90 mL/min (ref >90)
Glucose, Bld: 127 mg/dL — ABNORMAL HIGH (ref 70–99)
Potassium: 3.8 mEq/L (ref 3.5–5.1)
Sodium: 136 mEq/L (ref 135–145)
Total Bilirubin: 0.1 mg/dL — ABNORMAL LOW (ref 0.3–1.2)
Total Protein: 5.4 g/dL — ABNORMAL LOW (ref 6.0–8.3)

## 2012-09-01 SURGERY — Surgical Case
Anesthesia: Spinal | Site: Abdomen | Wound class: Clean Contaminated

## 2012-09-01 MED ORDER — SIMETHICONE 80 MG PO CHEW: 80.0000 mg | CHEWABLE_TABLET | ORAL | Status: DC | PRN

## 2012-09-01 MED ORDER — EPHEDRINE 5 MG/ML INJ
INTRAVENOUS | Status: AC
  Filled 2012-09-01: qty 10

## 2012-09-01 MED ORDER — MIDAZOLAM HCL 2 MG/2ML IJ SOLN: 0.5000 mg | Freq: Once | INTRAMUSCULAR | Status: DC | PRN

## 2012-09-01 MED ORDER — MENTHOL 3 MG MT LOZG: 1.0000 | LOZENGE | OROMUCOSAL | Status: DC | PRN

## 2012-09-01 MED ORDER — ACETAMINOPHEN 10 MG/ML IV SOLN
1000.0000 mg | Freq: Four times a day (QID) | INTRAVENOUS | Status: AC | PRN
  Filled 2012-09-01: qty 100

## 2012-09-01 MED ORDER — SIMETHICONE 80 MG PO CHEW
80.0000 mg | CHEWABLE_TABLET | Freq: Three times a day (TID) | ORAL | Status: DC
  Administered 2012-09-02 – 2012-09-05 (×12): 80 mg via ORAL

## 2012-09-01 MED ORDER — BUPIVACAINE IN DEXTROSE 0.75-8.25 % IT SOLN
INTRATHECAL | Status: DC | PRN
  Administered 2012-09-01: 1.4 mL via INTRATHECAL

## 2012-09-01 MED ORDER — PHENYLEPHRINE HCL 10 MG/ML IJ SOLN
INTRAMUSCULAR | Status: DC | PRN
  Administered 2012-09-01 (×2): 40 ug via INTRAVENOUS

## 2012-09-01 MED ORDER — DIPHENHYDRAMINE HCL 50 MG/ML IJ SOLN: 12.5000 mg | INTRAMUSCULAR | Status: DC | PRN

## 2012-09-01 MED ORDER — SODIUM CHLORIDE 0.9 % IJ SOLN: 3.0000 mL | INTRAMUSCULAR | Status: DC | PRN

## 2012-09-01 MED ORDER — PRENATAL MULTIVITAMIN CH
1.0000 | ORAL_TABLET | Freq: Every day | ORAL | Status: DC
  Administered 2012-09-02 – 2012-09-04 (×3): 1 via ORAL
  Filled 2012-09-01 (×3): qty 1

## 2012-09-01 MED ORDER — OXYTOCIN 10 UNIT/ML IJ SOLN
40.0000 [IU] | INTRAMUSCULAR | Status: DC | PRN
  Administered 2012-09-01: 40 [IU] via INTRAVENOUS

## 2012-09-01 MED ORDER — ZOLPIDEM TARTRATE 5 MG PO TABS: 5.0000 mg | ORAL_TABLET | Freq: Every evening | ORAL | Status: DC | PRN

## 2012-09-01 MED ORDER — ONDANSETRON HCL 4 MG/2ML IJ SOLN: 4.0000 mg | Freq: Three times a day (TID) | INTRAMUSCULAR | Status: DC | PRN

## 2012-09-01 MED ORDER — NALBUPHINE HCL 10 MG/ML IJ SOLN
5.0000 mg | INTRAMUSCULAR | Status: DC | PRN
  Filled 2012-09-01: qty 1

## 2012-09-01 MED ORDER — MORPHINE SULFATE (PF) 0.5 MG/ML IJ SOLN
INTRAMUSCULAR | Status: DC | PRN
  Administered 2012-09-01: .15 mg via INTRATHECAL

## 2012-09-01 MED ORDER — MORPHINE SULFATE 0.5 MG/ML IJ SOLN
INTRAMUSCULAR | Status: AC
  Filled 2012-09-01: qty 10

## 2012-09-01 MED ORDER — SCOPOLAMINE 1 MG/3DAYS TD PT72
MEDICATED_PATCH | TRANSDERMAL | Status: AC
  Filled 2012-09-01: qty 1

## 2012-09-01 MED ORDER — CEFAZOLIN SODIUM-DEXTROSE 2-3 GM-% IV SOLR
2.0000 g | Freq: Once | INTRAVENOUS | Status: AC
  Administered 2012-09-01: 2 g via INTRAVENOUS
  Filled 2012-09-01: qty 50

## 2012-09-01 MED ORDER — SENNOSIDES-DOCUSATE SODIUM 8.6-50 MG PO TABS
2.0000 | ORAL_TABLET | Freq: Every day | ORAL | Status: DC
  Administered 2012-09-01 – 2012-09-04 (×4): 2 via ORAL

## 2012-09-01 MED ORDER — EPHEDRINE SULFATE 50 MG/ML IJ SOLN
INTRAMUSCULAR | Status: DC | PRN
  Administered 2012-09-01 (×2): 5 mg via INTRAVENOUS
  Administered 2012-09-01 (×3): 10 mg via INTRAVENOUS
  Administered 2012-09-01: 5 mg via INTRAVENOUS
  Administered 2012-09-01: 20 mg via INTRAVENOUS
  Administered 2012-09-01 (×2): 10 mg via INTRAVENOUS
  Administered 2012-09-01: 5 mg via INTRAVENOUS
  Administered 2012-09-01: 10 mg via INTRAVENOUS
  Administered 2012-09-01: 20 mg via INTRAVENOUS

## 2012-09-01 MED ORDER — NALOXONE HCL 1 MG/ML IJ SOLN: 1.0000 ug/kg/h | INTRAVENOUS | Status: DC | PRN

## 2012-09-01 MED ORDER — SODIUM CHLORIDE 0.9 % IR SOLN
Status: DC | PRN
  Administered 2012-09-01: 1000 mL

## 2012-09-01 MED ORDER — FENTANYL CITRATE 0.05 MG/ML IJ SOLN
INTRAMUSCULAR | Status: AC
  Filled 2012-09-01: qty 2

## 2012-09-01 MED ORDER — IBUPROFEN 600 MG PO TABS
600.0000 mg | ORAL_TABLET | Freq: Four times a day (QID) | ORAL | Status: DC
  Administered 2012-09-02 – 2012-09-05 (×13): 600 mg via ORAL
  Filled 2012-09-01 (×13): qty 1

## 2012-09-01 MED ORDER — FENTANYL CITRATE 0.05 MG/ML IJ SOLN: 25.0000 ug | INTRAMUSCULAR | Status: DC | PRN

## 2012-09-01 MED ORDER — LANOLIN HYDROUS EX OINT: 1.0000 "application " | TOPICAL_OINTMENT | CUTANEOUS | Status: DC | PRN

## 2012-09-01 MED ORDER — ONDANSETRON HCL 4 MG/2ML IJ SOLN: 4.0000 mg | INTRAMUSCULAR | Status: DC | PRN

## 2012-09-01 MED ORDER — OXYTOCIN 40 UNITS IN LACTATED RINGERS INFUSION - SIMPLE MED: 62.5000 mL/h | INTRAVENOUS | Status: AC

## 2012-09-01 MED ORDER — LACTATED RINGERS IV SOLN
INTRAVENOUS | Status: DC | PRN
  Administered 2012-09-01 (×3): via INTRAVENOUS

## 2012-09-01 MED ORDER — SCOPOLAMINE 1 MG/3DAYS TD PT72
1.0000 | MEDICATED_PATCH | Freq: Once | TRANSDERMAL | Status: DC
  Administered 2012-09-01: 1.5 mg via TRANSDERMAL

## 2012-09-01 MED ORDER — WITCH HAZEL-GLYCERIN EX PADS: 1.0000 "application " | MEDICATED_PAD | CUTANEOUS | Status: DC | PRN

## 2012-09-01 MED ORDER — DIPHENHYDRAMINE HCL 25 MG PO CAPS
25.0000 mg | ORAL_CAPSULE | Freq: Four times a day (QID) | ORAL | Status: DC | PRN
  Administered 2012-09-02: 25 mg via ORAL

## 2012-09-01 MED ORDER — FENTANYL CITRATE 0.05 MG/ML IJ SOLN
INTRAMUSCULAR | Status: DC | PRN
  Administered 2012-09-01: 25 ug via INTRATHECAL

## 2012-09-01 MED ORDER — TETANUS-DIPHTH-ACELL PERTUSSIS 5-2.5-18.5 LF-MCG/0.5 IM SUSP
0.5000 mL | Freq: Once | INTRAMUSCULAR | Status: DC
  Filled 2012-09-01: qty 0.5

## 2012-09-01 MED ORDER — CITRIC ACID-SODIUM CITRATE 334-500 MG/5ML PO SOLN
ORAL | Status: AC
  Filled 2012-09-01: qty 15

## 2012-09-01 MED ORDER — ONDANSETRON HCL 4 MG PO TABS: 4.0000 mg | ORAL_TABLET | ORAL | Status: DC | PRN

## 2012-09-01 MED ORDER — LACTATED RINGERS IV SOLN
INTRAVENOUS | Status: DC
  Administered 2012-09-02: 07:00:00 via INTRAVENOUS

## 2012-09-01 MED ORDER — ONDANSETRON HCL 4 MG/2ML IJ SOLN
INTRAMUSCULAR | Status: DC | PRN
  Administered 2012-09-01: 4 mg via INTRAVENOUS

## 2012-09-01 MED ORDER — DIPHENHYDRAMINE HCL 50 MG/ML IJ SOLN: 25.0000 mg | INTRAMUSCULAR | Status: DC | PRN

## 2012-09-01 MED ORDER — NALOXONE HCL 0.4 MG/ML IJ SOLN: 0.4000 mg | INTRAMUSCULAR | Status: DC | PRN

## 2012-09-01 MED ORDER — DIBUCAINE 1 % RE OINT: 1.0000 "application " | TOPICAL_OINTMENT | RECTAL | Status: DC | PRN

## 2012-09-01 MED ORDER — NALBUPHINE HCL 10 MG/ML IJ SOLN
5.0000 mg | INTRAMUSCULAR | Status: DC | PRN
Start: 2012-09-01 — End: 2012-09-03
  Filled 2012-09-01: qty 1

## 2012-09-01 MED ORDER — KETOROLAC TROMETHAMINE 30 MG/ML IJ SOLN
INTRAMUSCULAR | Status: AC
  Filled 2012-09-01: qty 1

## 2012-09-01 MED ORDER — KETOROLAC TROMETHAMINE 30 MG/ML IJ SOLN
30.0000 mg | Freq: Four times a day (QID) | INTRAMUSCULAR | Status: AC | PRN
  Administered 2012-09-01: 30 mg via INTRAVENOUS

## 2012-09-01 MED ORDER — DIPHENHYDRAMINE HCL 25 MG PO CAPS
25.0000 mg | ORAL_CAPSULE | ORAL | Status: DC | PRN
  Filled 2012-09-01: qty 1

## 2012-09-01 MED ORDER — OXYTOCIN 10 UNIT/ML IJ SOLN
INTRAMUSCULAR | Status: AC
  Filled 2012-09-01: qty 4

## 2012-09-01 MED ORDER — PHENYLEPHRINE 40 MCG/ML (10ML) SYRINGE FOR IV PUSH (FOR BLOOD PRESSURE SUPPORT)
PREFILLED_SYRINGE | INTRAVENOUS | Status: AC
  Filled 2012-09-01: qty 5

## 2012-09-01 MED ORDER — MEPERIDINE HCL 25 MG/ML IJ SOLN: 6.2500 mg | INTRAMUSCULAR | Status: DC | PRN

## 2012-09-01 MED ORDER — ONDANSETRON HCL 4 MG/2ML IJ SOLN
INTRAMUSCULAR | Status: AC
  Filled 2012-09-01: qty 2

## 2012-09-01 MED ORDER — METOCLOPRAMIDE HCL 5 MG/ML IJ SOLN: 10.0000 mg | Freq: Three times a day (TID) | INTRAMUSCULAR | Status: DC | PRN

## 2012-09-01 MED ORDER — PROMETHAZINE HCL 25 MG/ML IJ SOLN: 6.2500 mg | INTRAMUSCULAR | Status: DC | PRN

## 2012-09-01 MED ORDER — OXYCODONE-ACETAMINOPHEN 5-325 MG PO TABS: 1.0000 | ORAL_TABLET | ORAL | Status: DC | PRN

## 2012-09-01 MED ORDER — KETOROLAC TROMETHAMINE 30 MG/ML IJ SOLN: 30.0000 mg | Freq: Four times a day (QID) | INTRAMUSCULAR | Status: AC | PRN

## 2012-09-01 SURGICAL SUPPLY — 35 items
ADH SKN CLS APL DERMABOND .7 (GAUZE/BANDAGES/DRESSINGS) ×1
ADH SKN CLS LQ APL DERMABOND (GAUZE/BANDAGES/DRESSINGS) ×1
CLAMP CORD UMBIL (MISCELLANEOUS) ×2 IMPLANT
CLOTH BEACON ORANGE TIMEOUT ST (SAFETY) ×2 IMPLANT
DERMABOND ADHESIVE PROPEN (GAUZE/BANDAGES/DRESSINGS) ×1
DERMABOND ADVANCED (GAUZE/BANDAGES/DRESSINGS) ×1
DERMABOND ADVANCED .7 DNX12 (GAUZE/BANDAGES/DRESSINGS) ×1 IMPLANT
DERMABOND ADVANCED .7 DNX6 (GAUZE/BANDAGES/DRESSINGS) ×1 IMPLANT
DRAPE LG THREE QUARTER DISP (DRAPES) ×2 IMPLANT
DRSG OPSITE POSTOP 4X10 (GAUZE/BANDAGES/DRESSINGS) ×2 IMPLANT
DURAPREP 26ML APPLICATOR (WOUND CARE) ×2 IMPLANT
ELECT REM PT RETURN 9FT ADLT (ELECTROSURGICAL) ×2
ELECTRODE REM PT RTRN 9FT ADLT (ELECTROSURGICAL) ×1 IMPLANT
EXTRACTOR VACUUM M CUP 4 TUBE (SUCTIONS) IMPLANT
GLOVE BIO SURGEON STRL SZ8.5 (GLOVE) ×2 IMPLANT
GOWN PREVENTION PLUS XXLARGE (GOWN DISPOSABLE) ×2 IMPLANT
GOWN STRL REIN XL XLG (GOWN DISPOSABLE) ×4 IMPLANT
KIT ABG SYR 3ML LUER SLIP (SYRINGE) ×2 IMPLANT
NDL HYPO 25X5/8 SAFETYGLIDE (NEEDLE) ×1 IMPLANT
NEEDLE HYPO 25X5/8 SAFETYGLIDE (NEEDLE) ×2 IMPLANT
NS IRRIG 1000ML POUR BTL (IV SOLUTION) ×2 IMPLANT
PACK C SECTION WH (CUSTOM PROCEDURE TRAY) ×2 IMPLANT
PAD OB MATERNITY 4.3X12.25 (PERSONAL CARE ITEMS) ×2 IMPLANT
SUT CHROMIC 0 CT 802H (SUTURE) ×2 IMPLANT
SUT CHROMIC 1 CTX 36 (SUTURE) ×4 IMPLANT
SUT CHROMIC 2 0 SH (SUTURE) ×2 IMPLANT
SUT GUT PLAIN 0 CT-3 TAN 27 (SUTURE) IMPLANT
SUT MON AB 4-0 PS1 27 (SUTURE) ×2 IMPLANT
SUT VIC AB 0 CT1 18XCR BRD8 (SUTURE) IMPLANT
SUT VIC AB 0 CT1 8-18 (SUTURE)
SUT VIC AB 0 CTX 36 (SUTURE) ×4
SUT VIC AB 0 CTX36XBRD ANBCTRL (SUTURE) ×2 IMPLANT
TOWEL OR 17X24 6PK STRL BLUE (TOWEL DISPOSABLE) ×6 IMPLANT
TRAY FOLEY CATH 14FR (SET/KITS/TRAYS/PACK) ×2 IMPLANT
WATER STERILE IRR 1000ML POUR (IV SOLUTION) ×2 IMPLANT

## 2012-09-01 NOTE — OR Nursing (Addendum)
Uterus massaged by S. Karleen Seebeck Charity fundraiser. Two tubes of cord blood sent to lab.  0cc of blood evacuated from uterus during uterine massage.

## 2012-09-01 NOTE — Transfer of Care (Signed)
Immediate Anesthesia Transfer of Care Note  Patient: Stephanie Mack  Procedure(s) Performed: Procedure(s):  Primary cesarean section with delivery of baby girl at 57.  Apgars 1/1.   (N/A)  Patient Location: PACU  Anesthesia Type:Spinal  Level of Consciousness: awake, alert  and oriented  Airway & Oxygen Therapy: Patient Spontanous Breathing  Post-op Assessment: Report given to PACU RN and Post -op Vital signs reviewed and stable  Post vital signs: Reviewed and stable  Complications: No apparent anesthesia complications

## 2012-09-01 NOTE — Progress Notes (Signed)
Patient ID: Stephanie Mack, female   DOB: 1990-08-02, 22 y.o.   MRN: [id number] Blood pressure normal patient on magnesium sulfate and labetalol 300 mg every 8 hours Ultrasound done by Dr.  Sherrie George yesterday shows severe IUGR estimated fetal weight 362 g no amniotic fluid so her suggestion was and brachial 5 PM today after 48 hours all betamethasone and delivered a patient by C-section prognosis for survival of the baby is extremely bad patient and her grandmother were informed and understand

## 2012-09-01 NOTE — Anesthesia Procedure Notes (Signed)
Spinal  Patient location during procedure: OR Start time: 09/01/2012 5:13 PM Staffing Anesthesiologist: Angus Seller., Harrell Gave. Performed by: anesthesiologist  Preanesthetic Checklist Completed: patient identified, site marked, surgical consent, pre-op evaluation, timeout performed, IV checked, risks and benefits discussed and monitors and equipment checked Spinal Block Patient position: sitting Prep: DuraPrep Patient monitoring: heart rate, cardiac monitor, continuous pulse ox and blood pressure Approach: midline Location: L3-4 Injection technique: single-shot Needle Needle type: Sprotte  Needle gauge: 24 G Needle length: 9 cm Assessment Sensory level: T4 Additional Notes Patient identified.  Risk benefits discussed including failed block, incomplete pain control, headache, nerve damage, paralysis, blood pressure changes, nausea, vomiting, reactions to medication both toxic or allergic, and postpartum back pain.  Patient expressed understanding and wished to proceed.  All questions were answered.  Sterile technique used throughout procedure.  CSF was clear.  No parasthesia or other complications.  Please see nursing notes for vital signs.

## 2012-09-01 NOTE — Consult Note (Signed)
In light of recent info EFW of 380 gm, severe oligo, I spoke to Ms Waldren and discussed associated extremely high mortality rate with severe SGA baby at 25 weeks. I also discussed that limitation of resuscitation will be if the smallest ETT can pass through airway but reassured her that we will be prepared to provide everything at delivery for the baby. Ms Skarzynski's ggm asked our experience with these extremely small babies. On review of our data, we have had 4 <400 gm in the past years but have had no survivors. I expressed to them that we will still give this baby all the chance and provide full support.  Discussed with Dr Gaynell Face.  Discussed using LMA with Anesthesia as an alternative for mgt at delivery. On their review, the smallest LMA airway is for 1500 gm infant, therefore not usable for this baby.  Nandita Mathenia Q

## 2012-09-01 NOTE — Op Note (Signed)
preop diagnosis severe preeclampsia severe IUGR at 25 weeks Postop diagnosis the same Anesthesia spinal Surgeon Dr. Francoise Ceo Patient placed on the operating table in the supine position after the spinal administered abdomen prepped and draped bladder emptied with a Foley catheter a transverse suprapubic incision made carried down to the rectus fascia the fascia cleaned and incised the length of the incision recti muscles retracted laterally and the peritoneum incised longitudinally the uterus was small therefore a classical incision was done on the uterus no fluid was noted it was a breech presentation and was  a very small a female fetus the team in attendance and Apgars were 1 and 1 and the placenta removed manually and sent to pathology uterine cavity clean with dry laps and the uterus closed  In two layers with continous nomber one chromic l   tubes and ovaries normal uterus well contracted abdomen closed in layers peritoneum continuous with 2-0 chromic fascia continuous with of 0 Dexon and the skin closed with subcuticular stitch of 4-0 Monocryl blood loss was 500 cc patient tolerated the procedure well

## 2012-09-01 NOTE — Anesthesia Postprocedure Evaluation (Signed)
  Anesthesia Post Note  Patient: Stephanie Mack  Procedure(s) Performed: Procedure(s) (LRB):  Primary cesarean section with delivery of baby girl at 42.  Apgars 1/1.   (N/A)  Anesthesia type: Spinal  Patient location: PACU  Post pain: Pain level controlled  Post assessment: Post-op Vital signs reviewed  Last Vitals:  Filed Vitals:   09/01/12 1900  BP: 124/93  Pulse: 77  Temp:   Resp: 17    Post vital signs: Reviewed  Level of consciousness: awake  Complications: No apparent anesthesia complications

## 2012-09-01 NOTE — Anesthesia Preprocedure Evaluation (Signed)
Anesthesia Evaluation  Patient identified by MRN, date of birth, ID band Patient awake    Reviewed: Allergy & Precautions, H&P , NPO status , Patient's Chart, lab work & pertinent test results  Airway Mallampati: II      Dental no notable dental hx.    Pulmonary neg pulmonary ROS,  breath sounds clear to auscultation  Pulmonary exam normal       Cardiovascular Exercise Tolerance: Good hypertension, negative cardio ROS  Rhythm:regular Rate:Normal     Neuro/Psych PSYCHIATRIC DISORDERS Anxiety Depression negative neurological ROS  negative psych ROS   GI/Hepatic negative GI ROS, Neg liver ROS,   Endo/Other  negative endocrine ROS  Renal/GU negative Renal ROS  negative genitourinary   Musculoskeletal   Abdominal Normal abdominal exam  (+)   Peds  Hematology negative hematology ROS (+) anemia ,   Anesthesia Other Findings Depression   depression Bipolar disorder        ADHD (attention deficit hyperactivity disorder)     Anxiety        Anemia     ADHD (attention deficit hyperactivity disorder)        Bipolar 1 disorder     Chlamydia 05-31-10      Pseudocyesis 2013 Seen in MAU for percieved FM, abd distension. Normal exam.  Gonorrhea contact, treated        Pregnancy induced hypertension      Reproductive/Obstetrics (+) Pregnancy                           Anesthesia Physical Anesthesia Plan  ASA: III and emergent  Anesthesia Plan: Spinal   Post-op Pain Management:    Induction:   Airway Management Planned:   Additional Equipment:   Intra-op Plan:   Post-operative Plan:   Informed Consent: I have reviewed the patients History and Physical, chart, labs and discussed the procedure including the risks, benefits and alternatives for the proposed anesthesia with the patient or authorized representative who has indicated his/her understanding and acceptance.     Plan Discussed with:  Anesthesiologist, CRNA and Surgeon  Anesthesia Plan Comments:         Anesthesia Quick Evaluation

## 2012-09-01 NOTE — H&P (Signed)
Since patient's admission severe preeclampsia her ultrasound at MFM yesterday given estimated fetal weight of 362 g no amniotic fluid noted severe IUGR and MFM suggested that the patient delivered by C-section today 4 8 hours after her betamethasone so that will performed at 5 PM

## 2012-09-02 LAB — CBC
HCT: 28.4 % — ABNORMAL LOW (ref 36.0–46.0)
MCH: 26.6 pg (ref 26.0–34.0)
MCHC: 32.7 g/dL (ref 30.0–36.0)
MCV: 81.1 fL (ref 78.0–100.0)
Platelets: 152 10*3/uL (ref 150–400)
RDW: 14.5 % (ref 11.5–15.5)
WBC: 16.9 10*3/uL — ABNORMAL HIGH (ref 4.0–10.5)

## 2012-09-02 MED ORDER — SODIUM CHLORIDE 0.9 % IJ SOLN
3.0000 mL | INTRAMUSCULAR | Status: DC | PRN
  Administered 2012-09-03: 3 mL via INTRAVENOUS

## 2012-09-02 MED ORDER — SODIUM CHLORIDE 0.9 % IJ SOLN
3.0000 mL | Freq: Two times a day (BID) | INTRAMUSCULAR | Status: DC
  Administered 2012-09-02 – 2012-09-03 (×2): 3 mL via INTRAVENOUS

## 2012-09-02 NOTE — Progress Notes (Signed)
Patient ID: Stephanie Mack, female   DOB: 1990/10/01, 22 y.o.   MRN: [id number] Postop day 1 Blood pressure is normal Baby weight 12 ounces at birth Fundus firm Legs negative Continue magnesium sulfate

## 2012-09-02 NOTE — Anesthesia Postprocedure Evaluation (Signed)
  Anesthesia Post-op Note  Patient: Stephanie Mack  Procedure(s) Performed: Procedure(s):  Primary cesarean section with delivery of baby girl at 63.  Apgars 1/1.   (N/A)  Patient Location: ICU  Anesthesia Type:Spinal  Level of Consciousness: awake, alert  and oriented  Airway and Oxygen Therapy: Patient Spontanous Breathing  Post-op Pain: mild  Post-op Assessment: Patient's Cardiovascular Status Stable, Respiratory Function Stable, Patent Airway, No signs of Nausea or vomiting and Pain level controlled  Post-op Vital Signs: stable  Complications: No apparent anesthesia complications

## 2012-09-03 ENCOUNTER — Encounter (HOSPITAL_COMMUNITY): Payer: Self-pay | Admitting: Obstetrics

## 2012-09-03 DIAGNOSIS — O1410 Severe pre-eclampsia, unspecified trimester: Secondary | ICD-10-CM | POA: Diagnosis present

## 2012-09-03 MED ORDER — LABETALOL HCL 5 MG/ML IV SOLN: 20.0000 mg | Freq: Once | INTRAVENOUS | Status: AC

## 2012-09-03 MED ORDER — LABETALOL HCL 200 MG PO TABS
400.0000 mg | ORAL_TABLET | Freq: Three times a day (TID) | ORAL | Status: DC
  Administered 2012-09-03 – 2012-09-05 (×5): 400 mg via ORAL
  Filled 2012-09-03 (×6): qty 2

## 2012-09-03 MED ORDER — RHO D IMMUNE GLOBULIN 1500 UNIT/2ML IJ SOLN
300.0000 ug | Freq: Once | INTRAMUSCULAR | Status: AC
  Administered 2012-09-03: 300 ug via INTRAMUSCULAR
  Filled 2012-09-03: qty 2

## 2012-09-03 MED ORDER — BISACODYL 10 MG RE SUPP: 10.0000 mg | Freq: Every day | RECTAL | Status: DC | PRN

## 2012-09-03 MED ORDER — FUROSEMIDE 20 MG PO TABS
10.0000 mg | ORAL_TABLET | Freq: Two times a day (BID) | ORAL | Status: DC
  Administered 2012-09-03 – 2012-09-05 (×4): 10 mg via ORAL
  Filled 2012-09-03 (×4): qty 0.5

## 2012-09-03 MED ORDER — LABETALOL HCL 5 MG/ML IV SOLN
INTRAVENOUS | Status: AC
  Administered 2012-09-03: 20 mg via INTRAVENOUS
  Filled 2012-09-03: qty 4

## 2012-09-03 NOTE — Progress Notes (Signed)
Patient ID: Stephanie Mack, female   DOB: 20-May-1990, 20 y.o.   MRN: [id number] Subjective: POD# 1 s/p Cesarean Delivery.  Indications: severe preeclampsia  RH status/Rubella reviewed. Patient reports tolerating PO.  Denies HA/SOB/C/P/N/V/dizziness.  Reports flatus. Breast symptoms: no.  She reports vaginal bleeding as normal, without clots.  She is ambulating, urinating without difficulty.     Objective: Vital signs in last 24 hours: BP 136/71  Pulse 91  Temp(Src) 98.5 F (36.9 C) (Oral)  Resp 18  Ht 5' (1.524 m)  Wt 128 lb 11.2 oz (58.378 kg)  BMI 25.14 kg/m2  SpO2 96%  LMP 03/07/2012       Physical Exam:  General: alert CV: Regular rate and rhythm Resp: clear Abdomen: softly distended Lochia: minimal Uterine Fundus: firm, below umbilicus, nontender Incision: dressing C/D Ext: extremities normal, atraumatic, no cyanosis or edema    Recent Labs  09/01/12 1524 09/02/12 0520  HGB 10.7* 9.3*  HCT 32.4* 28.4*      Assessment/Plan: 22 y.o.  status post Cesarean section. POD# 2.   Doing well, stable.               OK to transfer-->floor HLIV  Ambulate IS Dulcolax supp Routine post-op care  JACKSON-MOORE,Tammee Thielke A 09/03/2012, 8:10 AM

## 2012-09-03 NOTE — Progress Notes (Signed)
Stephanie Mack is quietly taking in all that has happened over the past few days.  When I saw her last Friday on antenatal, she was still very hopeful and it seems that she is still in shock by the reality now.  She has family present with her and she is aware of community resources for support.  I offered grief support and grief education.  [address] [place] Pager, [phone] 2:37 PM   09/03/12 1400  Clinical Encounter Type  Visited With Patient and family together  Visit Type Spiritual support;Follow-up

## 2012-09-04 NOTE — Progress Notes (Signed)
UR completed 

## 2012-09-04 NOTE — Progress Notes (Signed)
Subjective: Postpartum Day 2: Cesarean Delivery Patient reports tolerating PO and no problems voiding.    Objective: Vital signs in last 24 hours: Temp:  [98 F (36.7 C)-98.8 F (37.1 C)] 98 F (36.7 C) (06/03 0829) Pulse Rate:  [71-88] 85 (06/03 0829) Resp:  [16-18] 18 (06/03 0829) BP: (154-181)/(85-108) 154/92 mmHg (06/03 0829) SpO2:  [94 %-99 %] 99 % (06/03 0829) Weight:  [115 lb 1 oz (52.192 kg)] 115 lb 1 oz (52.192 kg) (06/03 0630)  Physical Exam:  General: alert and no distress Lochia: appropriate Uterine Fundus: firm Incision: healing well DVT Evaluation: No evidence of DVT seen on physical exam.   Recent Labs  09/01/12 1524 09/02/12 0520  HGB 10.7* 9.3*  HCT 32.4* 28.4*    Assessment/Plan: Status post Cesarean section. Doing well postoperatively.  Continue current care.  HARPER,CHARLES A 09/04/2012, 8:46 AM

## 2012-09-05 LAB — RH IG WORKUP (INCLUDES ABO/RH)
Fetal Screen: NEGATIVE
Unit division: 0

## 2012-09-05 MED ORDER — OXYCODONE-ACETAMINOPHEN 5-325 MG PO TABS: 1.0000 | ORAL_TABLET | ORAL | Status: DC | PRN

## 2012-09-05 MED ORDER — MEDROXYPROGESTERONE ACETATE 150 MG/ML IM SUSP: 150.0000 mg | INTRAMUSCULAR | Status: DC

## 2012-09-05 MED ORDER — IBUPROFEN 600 MG PO TABS: 600.0000 mg | ORAL_TABLET | Freq: Four times a day (QID) | ORAL | Status: DC | PRN

## 2012-09-05 MED ORDER — FUSION PLUS PO CAPS: 1.0000 | ORAL_CAPSULE | Freq: Every day | ORAL | Status: DC

## 2012-09-05 MED ORDER — LABETALOL HCL 200 MG PO TABS: 400.0000 mg | ORAL_TABLET | Freq: Three times a day (TID) | ORAL | Status: DC

## 2012-09-05 NOTE — Progress Notes (Signed)
Discharge instructions reviewed with patient.  Patient states understanding of home care, medications, activity, signs/symptoms to refer to MD and return MD office visit.  No home equipment needed.  Patient ambulated for discharge in stable condition with staff without incident.

## 2012-09-05 NOTE — Progress Notes (Signed)
Subjective: Postpartum Day 3: Cesarean Delivery Patient reports tolerating PO.    Objective: Vital signs in last 24 hours: Temp:  [98 F (36.7 C)-98.3 F (36.8 C)] 98 F (36.7 C) (06/04 0537) Pulse Rate:  [78-97] 97 (06/04 0537) Resp:  [16-20] 16 (06/04 0537) BP: (138-187)/(82-102) 187/102 mmHg (06/04 0537) SpO2:  [97 %-100 %] 100 % (06/04 0537) Weight:  [112 lb 8 oz (51.03 kg)] 112 lb 8 oz (51.03 kg) (06/04 0537)  Physical Exam:  General: alert and no distress Lochia: appropriate Uterine Fundus: firm Incision: healing well DVT Evaluation: No evidence of DVT seen on physical exam.  No results found for this basename: HGB, HCT,  in the last 72 hours  Assessment/Plan: Status post Cesarean section. Doing well postoperatively.  Discharge home with standard precautions and return to clinic in 2 weeks.  HARPER,CHARLES A 09/05/2012, 8:50 AM

## 2012-09-05 NOTE — Discharge Summary (Signed)
Obstetric Discharge Summary Reason for Admission: Severe Preeclampsia Prenatal Procedures: ultrasound and PIH labs Intrapartum Procedures: cesarean: classical Postpartum Procedures: none Complications-Operative and Postpartum: none Hemoglobin  Date Value Range Status  09/02/2012 9.3* 12.0 - 15.0 g/dL Final     HCT  Date Value Range Status  09/02/2012 28.4* 36.0 - 46.0 % Final    Physical Exam:  General: alert and no distress Lochia: appropriate Uterine Fundus: firm Incision: healing well DVT Evaluation: No evidence of DVT seen on physical exam.  Discharge Diagnoses: Preelampsia-severe.  Delivered by Classical C/S.  Discharge Information: Date: 09/05/2012 Activity: pelvic rest Diet: routine Medications: PNV, Ibuprofen, Colace, Iron and Percocet Condition: stable Instructions: refer to practice specific booklet Discharge to: home Follow-up Information   Follow up with HARPER,CHARLES A, MD. Schedule an appointment as soon as possible for a visit in 2 weeks.   Contact information:   [address] [place] [address] [phone]       Newborn Data: Live born female  Birth Weight: 12.8 oz (362 g) APGAR: 1, 1  Neonatal Demise  HARPER,CHARLES A 09/05/2012, 9:01 AM

## 2012-09-06 ENCOUNTER — Encounter: Payer: Medicaid Other | Admitting: Obstetrics

## 2012-09-07 ENCOUNTER — Encounter: Payer: Self-pay | Admitting: Obstetrics

## 2012-09-18 ENCOUNTER — Ambulatory Visit (INDEPENDENT_AMBULATORY_CARE_PROVIDER_SITE_OTHER): Payer: Medicaid Other | Admitting: Obstetrics

## 2012-09-18 ENCOUNTER — Encounter: Payer: Self-pay | Admitting: Obstetrics

## 2012-09-18 DIAGNOSIS — Z3009 Encounter for other general counseling and advice on contraception: Secondary | ICD-10-CM

## 2012-09-18 MED ORDER — MEDROXYPROGESTERONE ACETATE 150 MG/ML IM SUSP
150.0000 mg | INTRAMUSCULAR | Status: DC
  Administered 2012-09-18: 150 mg via INTRAMUSCULAR

## 2012-09-18 NOTE — Progress Notes (Signed)
Subjective:     Stephanie Mack is a 22 y.o. female who presents for a postpartum visit. She is 2 weeks postpartum following a low cervical transverse Cesarean section. I have fully reviewed the prenatal and intrapartum course. The delivery was at 25.3 gestational weeks. Outcome: primary cesarean section, low transverse incision with neonatal demise. Anesthesia: epidural. Postpartum course has been WNL. Baby's course has been deceased. Baby is feeding by n/a. Bleeding staining only. Bowel function is normal. Bladder function is normal. Patient is not sexually active. Contraception method is planning Depo-Provera injections. Postpartum depression screening: positive.  The following portions of the patient's history were reviewed and updated as appropriate: allergies, current medications, past family history, past medical history, past social history, past surgical history and problem list.  Review of Systems Pertinent items are noted in HPI.   Objective:    BP 103/65  Pulse 88  Temp(Src) 98 F (36.7 C)  Wt 98 lb (44.453 kg)  BMI 19.14 kg/m2  Breastfeeding? No  General:  alert and no distress   Breasts:  inspection negative, no nipple discharge or bleeding, no masses or nodularity palpable  Lungs: not done  Heart:  not done  Abdomen: normal findings: soft, non-tender   Vulva:  normal  Vagina: normal vagina  Cervix:  no lesions  Corpus: normal size, contour, position, consistency, mobility, non-tender  Adnexa:  no mass, fullness, tenderness  Rectal Exam: Not performed.        Assessment:     Normal postpartum exam. Pap smear not done at today's visit.     Counseling for contraceptive method.  Plan:    1. Contraception:  Wants Depo-Provera injections 2. Depo Provera Rx 3. Follow up in: several months or as needed.

## 2012-10-16 ENCOUNTER — Encounter (HOSPITAL_COMMUNITY): Payer: Self-pay | Admitting: Obstetrics and Gynecology

## 2012-10-16 ENCOUNTER — Inpatient Hospital Stay (HOSPITAL_COMMUNITY)
Admission: AD | Admit: 2012-10-16 | Discharge: 2012-10-16 | Disposition: A | Discharge status: Home / Self Care | Payer: Medicaid Other | Source: Ambulatory Visit | Attending: Obstetrics | Admitting: Obstetrics

## 2012-10-16 DIAGNOSIS — K219 Gastro-esophageal reflux disease without esophagitis: Secondary | ICD-10-CM | POA: Insufficient documentation

## 2012-10-16 DIAGNOSIS — O99345 Other mental disorders complicating the puerperium: Secondary | ICD-10-CM | POA: Insufficient documentation

## 2012-10-16 DIAGNOSIS — R1010 Upper abdominal pain, unspecified: Secondary | ICD-10-CM

## 2012-10-16 DIAGNOSIS — IMO0001 Reserved for inherently not codable concepts without codable children: Secondary | ICD-10-CM | POA: Insufficient documentation

## 2012-10-16 DIAGNOSIS — I10 Essential (primary) hypertension: Secondary | ICD-10-CM

## 2012-10-16 DIAGNOSIS — R109 Unspecified abdominal pain: Secondary | ICD-10-CM | POA: Insufficient documentation

## 2012-10-16 DIAGNOSIS — O99893 Other specified diseases and conditions complicating puerperium: Secondary | ICD-10-CM | POA: Insufficient documentation

## 2012-10-16 DIAGNOSIS — F3289 Other specified depressive episodes: Secondary | ICD-10-CM | POA: Insufficient documentation

## 2012-10-16 DIAGNOSIS — F329 Major depressive disorder, single episode, unspecified: Secondary | ICD-10-CM | POA: Insufficient documentation

## 2012-10-16 LAB — CBC
HCT: 40.2 % (ref 36.0–46.0)
MCHC: 31.6 g/dL (ref 30.0–36.0)
RDW: 13.7 % (ref 11.5–15.5)

## 2012-10-16 LAB — URINALYSIS, ROUTINE W REFLEX MICROSCOPIC
Bilirubin Urine: NEGATIVE
Nitrite: NEGATIVE
Specific Gravity, Urine: >1.03 — ABNORMAL HIGH (ref 1.005–1.030)
pH: 6.5 (ref 5.0–8.0)

## 2012-10-16 LAB — URINE MICROSCOPIC-ADD ON

## 2012-10-16 MED ORDER — FAMOTIDINE 20 MG PO TABS: 20.0000 mg | ORAL_TABLET | Freq: Two times a day (BID) | ORAL | Status: DC

## 2012-10-16 MED ORDER — LABETALOL HCL 100 MG PO TABS
400.0000 mg | ORAL_TABLET | Freq: Once | ORAL | Status: AC
  Administered 2012-10-16: 400 mg via ORAL
  Filled 2012-10-16: qty 4

## 2012-10-16 MED ORDER — GI COCKTAIL ~~LOC~~
30.0000 mL | Freq: Once | ORAL | Status: AC
  Administered 2012-10-16: 30 mL via ORAL
  Filled 2012-10-16: qty 30

## 2012-10-16 MED ORDER — LABETALOL HCL 100 MG PO TABS: 400.0000 mg | ORAL_TABLET | Freq: Two times a day (BID) | ORAL | Status: DC

## 2012-10-16 NOTE — MAU Note (Signed)
Patient states she has had vomiting and abdominal pain since 7-13. Denies bleeding or discharge.

## 2012-10-16 NOTE — MAU Provider Note (Signed)
History     CSN: [id number]  Arrival date and time: 10/16/12 1613   First Provider Initiated Contact with Patient 10/16/12 1955      Chief Complaint  Patient presents with  . Emesis  . Abdominal Pain   HPI  Pt is 2 months post partum after C/S for pre-eclampsia at 25 weeks with apgars of 1/1 and death of baby.  Pt presents with upper abdominal pain for 2 days.  Pt vomited 2 days ago.  Grandmother is with pt today and says pt does not eat well- eats fast food- pizza and hot pockets.  Pt is on Depo Provera and denies bleeding, vaginal discharge, UTI symptoms, constipation or diarrhea.  Pain is worse after she eats.  Pt is depressed and did not keep her previous appt with Dr. Clearance Coots and ran out of her labetalol -taking last does yesterday morning.  Pt is also bipolar and has previously seen mental health and grandmother says that she will go back.  Grandmother also asked if pt could restart her psych meds. Pt denies headache or change in vision  Past Medical History  Diagnosis Date  . Depression     depression  . Bipolar disorder   . ADHD (attention deficit hyperactivity disorder)   . Anxiety   . Anemia   . ADHD (attention deficit hyperactivity disorder)   . Bipolar 1 disorder   . Chlamydia 05-31-10  . Pseudocyesis 2013    Seen in MAU for percieved FM, abd distension. Normal exam.   . Gonorrhea contact, treated   . Pregnancy induced hypertension     Past Surgical History  Procedure Laterality Date  . Nasal septum surgery    . Cesarean section N/A 09/01/2012    Procedure:  Primary cesarean section with delivery of baby girl at 1741.  Apgars 1/1.  ;  Surgeon: Kathreen Cosier, MD;  Location: [facility];  Service: Obstetrics;  Laterality: N/A;    Family History  Problem Relation Age of Onset  . Kidney disease Mother   . Hypertension Father   . Cancer Sister   . Asthma Brother     History  Substance Use Topics  . Smoking status: Never Smoker   . Smokeless tobacco: Never  Used  . Alcohol Use: No    Allergies: No Known Allergies  Facility-administered medications prior to admission  Medication Dose Route Frequency Provider Last Rate Last Dose  . medroxyPROGESTERone (DEPO-PROVERA) injection 150 mg  150 mg Intramuscular Q90 days Brock Bad, MD   150 mg at 09/18/12 1352   Prescriptions prior to admission  Medication Sig Dispense Refill  . ibuprofen (ADVIL,MOTRIN) 600 MG tablet Take 1 tablet (600 mg total) by mouth every 6 (six) hours as needed for pain.  30 tablet  5  . Iron-FA-B Cmp-C-Biot-Probiotic (FUSION PLUS) CAPS Take 1 capsule by mouth daily before breakfast.  30 capsule  5  . labetalol (NORMODYNE) 200 MG tablet Take 2 tablets (400 mg total) by mouth every 8 (eight) hours.  60 tablet  0  . medroxyPROGESTERone (DEPO-PROVERA) 150 MG/ML injection Inject 1 mL (150 mg total) into the muscle every 3 (three) months.  1 mL  3  . oxyCODONE-acetaminophen (PERCOCET/ROXICET) 5-325 MG per tablet Take 1-2 tablets by mouth every 4 (four) hours as needed for pain.  40 tablet  0  . Prenatal Vit-Fe Fumarate-FA (PRENATAL MULTIVITAMIN) TABS Take 1 tablet by mouth daily.        Review of Systems  Constitutional: Negative for fever  and chills.  Eyes: Negative for blurred vision and double vision.  Gastrointestinal: Positive for heartburn, nausea and vomiting. Negative for diarrhea and constipation.  Genitourinary: Negative for dysuria and urgency.   Physical Exam   Blood pressure 145/102, pulse 76, temperature 98 F (36.7 C), temperature source Oral, resp. rate 18, height 5' 1.5" (1.562 m), weight 45.904 kg (101 lb 3.2 oz), SpO2 100.00%.  Physical Exam  Vitals reviewed. Constitutional: She appears well-developed and well-nourished.  tearful  HENT:  Head: Normocephalic.  Eyes: Pupils are equal, round, and reactive to light.    MAU Course  Procedures Discussed with Dr. Clearance Coots Pt's upper abdominal pain resolved with GI cocktail Will prescribe 1 month of  Labetalol and pt to f/u with Dr. Clearance Coots Discussed with pt and grandmother Assessment and Plan  GERD - pepcid 20mg   Hypertension- Labetalol 400mg  BID Depression/bipolar- return to psych and counseling Grief/grieving  Werner Labella 10/16/2012, 7:56 PM

## 2012-10-18 ENCOUNTER — Ambulatory Visit: Payer: Medicaid Other | Admitting: Obstetrics

## 2012-10-23 ENCOUNTER — Ambulatory Visit (INDEPENDENT_AMBULATORY_CARE_PROVIDER_SITE_OTHER): Payer: Medicaid Other | Admitting: Obstetrics

## 2012-10-23 ENCOUNTER — Other Ambulatory Visit: Payer: Self-pay | Admitting: Obstetrics

## 2012-10-23 ENCOUNTER — Encounter: Payer: Self-pay | Admitting: Obstetrics

## 2012-10-23 VITALS — BP 149/97 | HR 99 | Temp 98.1°F | Ht 63.0 in | Wt 98.5 lb

## 2012-10-23 DIAGNOSIS — N760 Acute vaginitis: Secondary | ICD-10-CM

## 2012-10-23 DIAGNOSIS — R198 Other specified symptoms and signs involving the digestive system and abdomen: Secondary | ICD-10-CM | POA: Insufficient documentation

## 2012-10-23 DIAGNOSIS — Z113 Encounter for screening for infections with a predominantly sexual mode of transmission: Secondary | ICD-10-CM

## 2012-10-23 DIAGNOSIS — R8290 Unspecified abnormal findings in urine: Secondary | ICD-10-CM

## 2012-10-23 DIAGNOSIS — N730 Acute parametritis and pelvic cellulitis: Secondary | ICD-10-CM

## 2012-10-23 DIAGNOSIS — Z3202 Encounter for pregnancy test, result negative: Secondary | ICD-10-CM

## 2012-10-23 DIAGNOSIS — R82998 Other abnormal findings in urine: Secondary | ICD-10-CM

## 2012-10-23 DIAGNOSIS — R52 Pain, unspecified: Secondary | ICD-10-CM

## 2012-10-23 DIAGNOSIS — A499 Bacterial infection, unspecified: Secondary | ICD-10-CM

## 2012-10-23 LAB — POCT URINALYSIS DIPSTICK
Spec Grav, UA: 1.02
Urobilinogen, UA: NEGATIVE

## 2012-10-23 LAB — POCT URINE PREGNANCY: Preg Test, Ur: NEGATIVE

## 2012-10-23 MED ORDER — METRONIDAZOLE 500 MG PO TABS: 500.0000 mg | ORAL_TABLET | Freq: Two times a day (BID) | ORAL | Status: DC

## 2012-10-23 MED ORDER — OXYCODONE-ACETAMINOPHEN 5-325 MG PO TABS: 1.0000 | ORAL_TABLET | ORAL | Status: DC | PRN

## 2012-10-23 MED ORDER — DOXYCYCLINE HYCLATE 50 MG PO CAPS: 100.0000 mg | ORAL_CAPSULE | Freq: Two times a day (BID) | ORAL | Status: DC

## 2012-10-23 NOTE — Progress Notes (Signed)
  Subjective:     Stephanie Mack is a 22 y.o. female who presents for evaluation of abdominal pain. The pain is described as aching and dull, and is 8/10 in intensity. Pain is located in the deep pelvis area without radiation. Onset was intermittent occurring several month ago. Symptoms have been gradually worsening since. Aggravating factors: eating. Alleviating factors: none. Associated symptoms: none. The patient denies belching, diarrhea, dysuria, fever, frequency and headache. Risk factors for pelvic/abdominal pain include none.  Menstrual History: OB History   Grav Para Term Preterm Abortions TAB SAB Ect Mult Living   2 1 0 1 1 0 1 0 1 1       No LMP recorded. Patient has had an injection.    The following portions of the patient's history were reviewed and updated as appropriate: allergies, current medications, past family history, past medical history, past social history, past surgical history and problem list.   Review of Systems Pertinent items are noted in HPI.    Objective:    BP 149/97  Pulse 99  Temp(Src) 98.1 F (36.7 C) (Oral)  Ht 5\' 3"  (1.6 m)  Wt 98 lb 8 oz (44.679 kg)  BMI 17.45 kg/m2  Breastfeeding? No General:   alert and no distress  Lungs:   clear to auscultation bilaterally  Heart:   regular rate and rhythm, S1, S2 normal, no murmur, click, rub or gallop  Abdomen:  normal findings: soft, non-tender  CVA:   absent  Pelvis:  Vaginal: discharge, grey and malodorous Cervix: normal appearance Adnexa: normal bimanual exam Uterus: mid-position and tender Clinical staff offered to be present for exam: yes  Initials: DK  Extremities:   extremities normal, atraumatic, no cyanosis or edema  Neurologic:   negative  Psychiatric:   normal mood, behavior, speech, dress, and thought processes   Lab Review Labs: Urinalysis - with micro, Urine culture, colony count, sensitivity, GC/Chlamydia DNA probe of cervico/vaginal secretions and Wet mount of vaginal secretions    Imaging Ultrasound - Pelvic Vaginal    Assessment:    Probable acute PID.    Plan:    The diagnosis was discussed with the patient and evaluation and treatment plans outlined. See orders for lab and imaging studies. Reassured patient that symptoms are almost certainly benign and self-resolving. Follow up in 2 weeks or as needed. Doxycycline / Flagyl Rx.  Ultrasound ordered.

## 2012-10-24 ENCOUNTER — Encounter: Payer: Self-pay | Admitting: Obstetrics

## 2012-10-24 LAB — WET PREP BY MOLECULAR PROBE
Candida species: NEGATIVE
Gardnerella vaginalis: POSITIVE — AB
Trichomonas vaginosis: NEGATIVE

## 2012-10-24 LAB — GC/CHLAMYDIA PROBE AMP: GC Probe RNA: NEGATIVE

## 2012-10-25 ENCOUNTER — Encounter: Payer: Self-pay | Admitting: Obstetrics

## 2012-10-30 ENCOUNTER — Other Ambulatory Visit: Payer: Medicaid Other

## 2012-11-07 ENCOUNTER — Ambulatory Visit: Payer: Medicaid Other | Admitting: Obstetrics

## 2012-11-08 ENCOUNTER — Ambulatory Visit: Payer: Medicaid Other | Admitting: Obstetrics

## 2012-11-13 ENCOUNTER — Other Ambulatory Visit: Payer: Medicaid Other

## 2012-11-13 ENCOUNTER — Ambulatory Visit: Payer: Medicaid Other | Admitting: Obstetrics

## 2012-11-19 ENCOUNTER — Ambulatory Visit (HOSPITAL_COMMUNITY): Payer: Medicaid Other

## 2012-11-19 ENCOUNTER — Ambulatory Visit (HOSPITAL_COMMUNITY)
Admission: RE | Admit: 2012-11-19 | Discharge: 2012-11-19 | Disposition: A | Discharge status: Home / Self Care | Payer: Medicaid Other | Source: Ambulatory Visit | Attending: Obstetrics | Admitting: Obstetrics

## 2012-11-19 DIAGNOSIS — D259 Leiomyoma of uterus, unspecified: Secondary | ICD-10-CM | POA: Insufficient documentation

## 2012-11-19 DIAGNOSIS — N949 Unspecified condition associated with female genital organs and menstrual cycle: Secondary | ICD-10-CM | POA: Insufficient documentation

## 2012-11-19 DIAGNOSIS — N730 Acute parametritis and pelvic cellulitis: Secondary | ICD-10-CM | POA: Insufficient documentation

## 2012-11-21 ENCOUNTER — Ambulatory Visit: Payer: Medicaid Other | Admitting: Obstetrics

## 2012-12-04 ENCOUNTER — Encounter: Payer: Self-pay | Admitting: Obstetrics

## 2012-12-04 ENCOUNTER — Ambulatory Visit (INDEPENDENT_AMBULATORY_CARE_PROVIDER_SITE_OTHER): Payer: Medicaid Other | Admitting: Obstetrics

## 2012-12-04 VITALS — BP 116/73 | HR 103 | Temp 98.2°F

## 2012-12-04 DIAGNOSIS — R109 Unspecified abdominal pain: Secondary | ICD-10-CM | POA: Insufficient documentation

## 2012-12-04 DIAGNOSIS — J069 Acute upper respiratory infection, unspecified: Secondary | ICD-10-CM

## 2012-12-04 DIAGNOSIS — N912 Amenorrhea, unspecified: Secondary | ICD-10-CM

## 2012-12-04 DIAGNOSIS — R112 Nausea with vomiting, unspecified: Secondary | ICD-10-CM

## 2012-12-04 DIAGNOSIS — K219 Gastro-esophageal reflux disease without esophagitis: Secondary | ICD-10-CM

## 2012-12-04 MED ORDER — AZITHROMYCIN 250 MG PO TABS: ORAL_TABLET | ORAL | Status: DC

## 2012-12-04 MED ORDER — OMEPRAZOLE 20 MG PO CPDR: 20.0000 mg | DELAYED_RELEASE_CAPSULE | Freq: Every day | ORAL | Status: DC

## 2012-12-04 MED ORDER — ONDANSETRON 8 MG PO TBDP: 8.0000 mg | ORAL_TABLET | Freq: Three times a day (TID) | ORAL | Status: DC | PRN

## 2012-12-04 NOTE — Progress Notes (Signed)
Subjective:     Stephanie Mack is a 22 y.o. female here for a routine exam.  Current complaints: Patient reports she has been sick not feeling well- n/v.  Personal health questionnaire reviewed: no.   Gynecologic History No LMP recorded. Patient has had an injection. Contraception: Depo-Provera injections Next injection is due 12/10/2012   Obstetric History OB History  Gravida Para Term Preterm AB SAB TAB Ectopic Multiple Living  2 1 0 1 1 1 0 0 1 1     # Outcome Date GA Lbr Len/2nd Weight Sex Delivery Anes PTL Lv  2 PRE 09/01/12 [redacted]w[redacted]d   F LTCS Spinal  Y  1 SAB 09/16/09 [redacted]w[redacted]d            Comments: Twin gestational sacs.        The following portions of the patient's history were reviewed and updated as appropriate: allergies, current medications, past family history, past medical history, past social history, past surgical history and problem list.  Review of Systems Pertinent items are noted in HPI.    Objective:    No exam performed today, Consult only..     Assessment:    URI.  Nausea   Plan:    Z-Pak Rx.    Zofran Rx.  F/U prn

## 2012-12-06 ENCOUNTER — Encounter: Payer: Self-pay | Admitting: Obstetrics

## 2012-12-11 ENCOUNTER — Ambulatory Visit: Payer: Medicaid Other

## 2012-12-27 ENCOUNTER — Inpatient Hospital Stay (HOSPITAL_COMMUNITY): Payer: Medicaid Other

## 2012-12-27 ENCOUNTER — Encounter (HOSPITAL_COMMUNITY): Payer: Self-pay | Admitting: *Deleted

## 2012-12-27 ENCOUNTER — Inpatient Hospital Stay (HOSPITAL_COMMUNITY)
Admission: AD | Admit: 2012-12-27 | Discharge: 2012-12-27 | Disposition: A | Discharge status: Home / Self Care | Payer: Medicaid Other | Source: Ambulatory Visit | Attending: Obstetrics | Admitting: Obstetrics

## 2012-12-27 DIAGNOSIS — R140 Abdominal distension (gaseous): Secondary | ICD-10-CM

## 2012-12-27 DIAGNOSIS — R142 Eructation: Secondary | ICD-10-CM | POA: Insufficient documentation

## 2012-12-27 DIAGNOSIS — R109 Unspecified abdominal pain: Secondary | ICD-10-CM | POA: Insufficient documentation

## 2012-12-27 DIAGNOSIS — R112 Nausea with vomiting, unspecified: Secondary | ICD-10-CM | POA: Insufficient documentation

## 2012-12-27 DIAGNOSIS — G8929 Other chronic pain: Secondary | ICD-10-CM | POA: Insufficient documentation

## 2012-12-27 DIAGNOSIS — R141 Gas pain: Secondary | ICD-10-CM

## 2012-12-27 LAB — URINALYSIS, ROUTINE W REFLEX MICROSCOPIC
Leukocytes, UA: NEGATIVE
Nitrite: NEGATIVE
Protein, ur: 100 mg/dL — AB
Urobilinogen, UA: 0.2 mg/dL (ref 0.0–1.0)

## 2012-12-27 LAB — COMPREHENSIVE METABOLIC PANEL
ALT: 8 U/L (ref 0–35)
Alkaline Phosphatase: 53 U/L (ref 39–117)
GFR calc Af Amer: >90 mL/min (ref >90)
Glucose, Bld: 86 mg/dL (ref 70–99)
Potassium: 3.6 mEq/L (ref 3.5–5.1)
Sodium: 139 mEq/L (ref 135–145)
Total Protein: 7.4 g/dL (ref 6.0–8.3)

## 2012-12-27 LAB — POCT PREGNANCY, URINE: Preg Test, Ur: NEGATIVE

## 2012-12-27 LAB — CBC WITH DIFFERENTIAL/PLATELET
Eosinophils Absolute: 0 10*3/uL (ref 0.0–0.7)
Lymphocytes Relative: 34 % (ref 12–46)
Lymphs Abs: 2 10*3/uL (ref 0.7–4.0)
MCH: 24.7 pg — ABNORMAL LOW (ref 26.0–34.0)
Neutro Abs: 3.4 10*3/uL (ref 1.7–7.7)
Neutrophils Relative %: 58 % (ref 43–77)
Platelets: 240 10*3/uL (ref 150–400)
RBC: 5.1 MIL/uL (ref 3.87–5.11)
WBC: 5.7 10*3/uL (ref 4.0–10.5)

## 2012-12-27 NOTE — MAU Provider Note (Signed)
History     CSN: [id number]  Arrival date and time: 12/27/12 1158   First Provider Initiated Contact with Patient 12/27/12 1335      Chief Complaint  Patient presents with  . Abdominal Pain  . Emesis  . Possible Pregnancy   HPI This is a 22 y.o. female who presents with abdominal pain and vomiting for 3 weeks. Her story changes a bit from moment to moment, poor historian.  First stated she has no vomiting then says she does. States has kept no food down in 3 weeks. Felt fine before that, though there are ED visits in July and August. Denies need for STD testing, states is not having sex. Missed Depo appt because she does not want it, states it makes her feel weak and she is not having sex.   Denies fever or bowel problems.  RN Note: Patient states she has been having lower abdominal pain and vomiting since 9-8. Was scheduled for her last Depo on 9-8 but missed it. Denies bleeding or discharge.       OB History   Grav Para Term Preterm Abortions TAB SAB Ect Mult Living   2 1 0 1 1 0 1 0 1 1       Past Medical History  Diagnosis Date  . Depression     depression  . Bipolar disorder   . ADHD (attention deficit hyperactivity disorder)   . Anxiety   . Anemia   . ADHD (attention deficit hyperactivity disorder)   . Bipolar 1 disorder   . Chlamydia 05-31-10  . Pseudocyesis 2013    Seen in MAU for percieved FM, abd distension. Normal exam.   . Gonorrhea contact, treated   . Pregnancy induced hypertension     Past Surgical History  Procedure Laterality Date  . Nasal septum surgery    . Cesarean section N/A 09/01/2012    Procedure:  Primary cesarean section with delivery of baby girl at 1741.  Apgars 1/1.  ;  Surgeon: Kathreen Cosier, MD;  Location: [facility];  Service: Obstetrics;  Laterality: N/A;    Family History  Problem Relation Age of Onset  . Kidney disease Mother   . Hypertension Father   . Cancer Sister   . Asthma Brother   . Heart disease Neg Hx   .  Diabetes Maternal Grandfather     History  Substance Use Topics  . Smoking status: Never Smoker   . Smokeless tobacco: Never Used  . Alcohol Use: No    Allergies: No Known Allergies  Facility-administered medications prior to admission  Medication Dose Route Frequency Provider Last Rate Last Dose  . medroxyPROGESTERone (DEPO-PROVERA) injection 150 mg  150 mg Intramuscular Q90 days Brock Bad, MD   150 mg at 09/18/12 1352   Prescriptions prior to admission  Medication Sig Dispense Refill  . ondansetron (ZOFRAN ODT) 8 MG disintegrating tablet Take 1 tablet (8 mg total) by mouth every 8 (eight) hours as needed for nausea.  30 tablet  2    Review of Systems  Constitutional: Positive for malaise/fatigue. Negative for fever and chills.  Gastrointestinal: Positive for nausea, vomiting and abdominal pain. Negative for diarrhea and constipation.  Genitourinary: Negative for dysuria.  Musculoskeletal: Negative for myalgias.  Neurological: Positive for weakness. Negative for dizziness and headaches.  Psychiatric/Behavioral: Positive for depression.   Physical Exam   Blood pressure 134/82, pulse 89, temperature 98.5 F (36.9 C), temperature source Oral, resp. rate 16, height 5\' 2"  (1.575 m), weight  45.269 kg (99 lb 12.8 oz), SpO2 100.00%.  Physical Exam  Constitutional: She is oriented to person, place, and time. She appears well-developed and well-nourished. No distress.  HENT:  Head: Normocephalic.  Cardiovascular: Normal rate.   Respiratory: Effort normal.  GI: Soft. She exhibits distension. She exhibits no mass. There is tenderness (mild lower abdominal ). There is no rebound and no guarding.  Genitourinary: Vagina normal and uterus normal. No vaginal discharge found.  Slight CMT, no adnexal tenderness  Musculoskeletal: Normal range of motion.  Neurological: She is alert and oriented to person, place, and time.  Skin: Skin is warm and dry.  Appears somewhat sad  MAU  Course  Procedures  MDM Will check labs and [place]. Recent Results (from the past 2160 hour(s))  URINALYSIS, ROUTINE W REFLEX MICROSCOPIC     Status: Abnormal   Collection Time    10/16/12  5:10 PM      Result Value Range   Color, Urine YELLOW  YELLOW   APPearance CLEAR  CLEAR   Specific Gravity, Urine >1.030 (*) 1.005 - 1.030   pH 6.5  5.0 - 8.0   Glucose, UA NEGATIVE  NEGATIVE mg/dL   Hgb urine dipstick TRACE (*) NEGATIVE   Bilirubin Urine NEGATIVE  NEGATIVE   Ketones, ur NEGATIVE  NEGATIVE mg/dL   Protein, ur 244 (*) NEGATIVE mg/dL   Urobilinogen, UA 0.2  0.0 - 1.0 mg/dL   Nitrite NEGATIVE  NEGATIVE   Leukocytes, UA NEGATIVE  NEGATIVE  URINE MICROSCOPIC-ADD ON     Status: Abnormal   Collection Time    10/16/12  5:10 PM      Result Value Range   Squamous Epithelial / LPF FEW (*) RARE   WBC, UA 0-2  <3 WBC/hpf   RBC / HPF 0-2  <3 RBC/hpf   Bacteria, UA FEW (*) RARE   Urine-Other MUCOUS PRESENT    POCT PREGNANCY, URINE     Status: None   Collection Time    10/16/12  5:15 PM      Result Value Range   Preg Test, Ur NEGATIVE  NEGATIVE   Comment:            THE SENSITIVITY OF THIS     METHODOLOGY IS >24 mIU/mL  CBC     Status: Abnormal   Collection Time    10/16/12  7:00 PM      Result Value Range   WBC 5.1  4.0 - 10.5 K/uL   RBC 4.91  3.87 - 5.11 MIL/uL   Hemoglobin 12.7  12.0 - 15.0 g/dL   HCT 01.0  27.2 - 53.6 %   MCV 81.9  78.0 - 100.0 fL   MCH 25.9 (*) 26.0 - 34.0 pg   MCHC 31.6  30.0 - 36.0 g/dL   RDW 64.4  03.4 - 74.2 %   Platelets 247  150 - 400 K/uL  POCT URINALYSIS DIPSTICK     Status: None   Collection Time    10/23/12  3:04 PM      Result Value Range   Color, UA YELLOW     Clarity, UA CLEAR     Glucose, UA NEGATIVE     Bilirubin, UA NEGATIVE     Ketones, UA NEGATIVE     Spec Grav, UA 1.020     Blood, UA TRACE     pH, UA 6.0     Protein, UA 3+     Urobilinogen, UA negative     Nitrite, UA  NEGATIVE     Leukocytes, UA small (1+)    POCT URINE  PREGNANCY     Status: None   Collection Time    10/23/12  3:10 PM      Result Value Range   Preg Test, Ur Negative    URINE CULTURE     Status: None   Collection Time    10/23/12  3:15 PM      Result Value Range   Colony Count NO GROWTH     Organism ID, Bacteria NO GROWTH    GC/CHLAMYDIA PROBE AMP     Status: None   Collection Time    10/23/12  3:39 PM      Result Value Range   CT Probe RNA NEGATIVE     GC Probe RNA NEGATIVE     Comment:                                                                                            Normal Reference Range: Negative                 Assay performed using the Gen-Probe APTIMA COMBO2 (R) Assay.           Acceptable specimen types for this assay include APTIMA Swabs (Unisex,     endocervical, urethral, or vaginal), first void urine, and ThinPrep     liquid based cytology samples.  WET PREP BY MOLECULAR PROBE     Status: Abnormal   Collection Time    10/23/12  3:39 PM      Result Value Range   Candida species NEG  Negative   Trichomonas vaginosis NEG  Negative   Gardnerella vaginalis POS (*) Negative  URINALYSIS, ROUTINE W REFLEX MICROSCOPIC     Status: Abnormal   Collection Time    12/27/12 12:34 PM      Result Value Range   Color, Urine YELLOW  YELLOW   APPearance CLEAR  CLEAR   Specific Gravity, Urine >1.030 (*) 1.005 - 1.030   pH 6.0  5.0 - 8.0   Glucose, UA NEGATIVE  NEGATIVE mg/dL   Hgb urine dipstick TRACE (*) NEGATIVE   Bilirubin Urine NEGATIVE  NEGATIVE   Ketones, ur NEGATIVE  NEGATIVE mg/dL   Protein, ur 161 (*) NEGATIVE mg/dL   Urobilinogen, UA 0.2  0.0 - 1.0 mg/dL   Nitrite NEGATIVE  NEGATIVE   Leukocytes, UA NEGATIVE  NEGATIVE  URINE MICROSCOPIC-ADD ON     Status: Abnormal   Collection Time    12/27/12 12:34 PM      Result Value Range   Squamous Epithelial / LPF RARE  RARE   WBC, UA 3-6  <3 WBC/hpf   Bacteria, UA FEW (*) RARE  POCT PREGNANCY, URINE     Status: None   Collection Time    12/27/12 12:35 PM       Result Value Range   Preg Test, Ur NEGATIVE  NEGATIVE   Comment:            THE SENSITIVITY OF THIS     METHODOLOGY IS >24 mIU/mL  CBC WITH DIFFERENTIAL  Status: Abnormal   Collection Time    12/27/12  2:20 PM      Result Value Range   WBC 5.7  4.0 - 10.5 K/uL   RBC 5.10  3.87 - 5.11 MIL/uL   Hemoglobin 12.6  12.0 - 15.0 g/dL   HCT 16.1  09.6 - 04.5 %   MCV 76.7 (*) 78.0 - 100.0 fL   MCH 24.7 (*) 26.0 - 34.0 pg   MCHC 32.2  30.0 - 36.0 g/dL   RDW 40.9  81.1 - 91.4 %   Platelets 240  150 - 400 K/uL   Neutrophils Relative % 58  43 - 77 %   Neutro Abs 3.4  1.7 - 7.7 K/uL   Lymphocytes Relative 34  12 - 46 %   Lymphs Abs 2.0  0.7 - 4.0 K/uL   Monocytes Relative 7  3 - 12 %   Monocytes Absolute 0.4  0.1 - 1.0 K/uL   Eosinophils Relative 1  0 - 5 %   Eosinophils Absolute 0.0  0.0 - 0.7 K/uL   Basophils Relative 0  0 - 1 %   Basophils Absolute 0.0  0.0 - 0.1 K/uL  COMPREHENSIVE METABOLIC PANEL     Status: None   Collection Time    12/27/12  2:20 PM      Result Value Range   Sodium 139  135 - 145 mEq/L   Potassium 3.6  3.5 - 5.1 mEq/L   Chloride 104  96 - 112 mEq/L   CO2 24  19 - 32 mEq/L   Glucose, Bld 86  70 - 99 mg/dL   BUN 6  6 - 23 mg/dL   Creatinine, Ser 7.82  0.50 - 1.10 mg/dL   Calcium 9.7  8.4 - 95.6 mg/dL   Total Protein 7.4  6.0 - 8.3 g/dL   Albumin 4.1  3.5 - 5.2 g/dL   AST 13  0 - 37 U/L   ALT 8  0 - 35 U/L   Alkaline Phosphatase 53  39 - 117 U/L   Total Bilirubin 0.6  0.3 - 1.2 mg/dL   GFR calc non Af Amer >90  >90 mL/min   GFR calc Af Amer >90  >90 mL/min   Comment: (NOTE)     The eGFR has been calculated using the CKD EPI equation.     This calculation has not been validated in all clinical situations.     eGFR's persistently <90 mL/min signify possible Chronic Kidney     Disease.   Verbal report from sonographer that Abd [place] is normal except for gas distention. Told Sonographer she had a full meal last night. (had told me she has had nothing to eat in  3 weeks)  Assessment and Plan  A:  Abdominal pain, probably due to gas distention      No evidence of acute abdominal process  P:  Discharge home after discussion with Dr Clearance Coots       Needs to establish care with a primary doctor.    Wynelle Bourgeois 12/27/2012, 2:09 PM

## 2012-12-27 NOTE — MAU Note (Signed)
Patient states she has been having lower abdominal pain and vomiting since 9-8. Was scheduled for her last Depo on 9-8 but missed it. Denies bleeding or discharge.

## 2012-12-28 LAB — URINE CULTURE

## 2013-01-01 ENCOUNTER — Other Ambulatory Visit (INDEPENDENT_AMBULATORY_CARE_PROVIDER_SITE_OTHER): Payer: Medicaid Other

## 2013-01-01 VITALS — BP 136/89 | HR 116 | Temp 97.9°F | Ht 63.0 in | Wt 99.0 lb

## 2013-01-01 DIAGNOSIS — Z3202 Encounter for pregnancy test, result negative: Secondary | ICD-10-CM

## 2013-01-01 NOTE — Progress Notes (Unsigned)
Patient is here today for a pregnancy confirmation.

## 2013-01-15 ENCOUNTER — Other Ambulatory Visit: Payer: Medicaid Other

## 2013-01-16 ENCOUNTER — Other Ambulatory Visit (INDEPENDENT_AMBULATORY_CARE_PROVIDER_SITE_OTHER): Payer: Medicaid Other

## 2013-01-16 VITALS — BP 125/89 | HR 99 | Ht 63.0 in | Wt 98.0 lb

## 2013-01-16 DIAGNOSIS — Z3202 Encounter for pregnancy test, result negative: Secondary | ICD-10-CM

## 2013-01-16 NOTE — Progress Notes (Unsigned)
Pt in office today for a pregnancy test. Pt states she is having an achy cramping feeling in her abdomen. Pt states she is also having vomiting and fatigue. Pt states she is having moodiness. Pregnancy test in office today is negative.

## 2013-02-12 ENCOUNTER — Ambulatory Visit (INDEPENDENT_AMBULATORY_CARE_PROVIDER_SITE_OTHER): Payer: Medicaid Other | Admitting: Obstetrics

## 2013-02-12 VITALS — BP 126/86 | HR 70 | Temp 97.6°F | Ht 60.0 in | Wt 101.0 lb

## 2013-02-12 DIAGNOSIS — N760 Acute vaginitis: Secondary | ICD-10-CM

## 2013-02-12 DIAGNOSIS — N739 Female pelvic inflammatory disease, unspecified: Secondary | ICD-10-CM

## 2013-02-12 DIAGNOSIS — N921 Excessive and frequent menstruation with irregular cycle: Secondary | ICD-10-CM

## 2013-02-12 DIAGNOSIS — R109 Unspecified abdominal pain: Secondary | ICD-10-CM

## 2013-02-12 LAB — POCT URINALYSIS DIPSTICK
Bilirubin, UA: NEGATIVE
Blood, UA: NEGATIVE
Glucose, UA: NEGATIVE
Ketones, UA: NEGATIVE
Leukocytes, UA: NEGATIVE
Nitrite, UA: NEGATIVE
Spec Grav, UA: 1.02
Urobilinogen, UA: NEGATIVE
pH, UA: 5

## 2013-02-12 MED ORDER — METRONIDAZOLE 500 MG PO TABS: 500.0000 mg | ORAL_TABLET | Freq: Two times a day (BID) | ORAL | Status: DC

## 2013-02-12 MED ORDER — DOXYCYCLINE HYCLATE 100 MG PO CAPS: 100.0000 mg | ORAL_CAPSULE | Freq: Two times a day (BID) | ORAL | Status: DC

## 2013-02-12 NOTE — Progress Notes (Signed)
Subjective:     Stephanie Mack is a 22 y.o. female here for a routine exam.  Current complaints: problem visit. Pt states she is having pain in her abdomen. Pt states she is also fatigued.  Personal health questionnaire reviewed: yes.   Gynecologic History Patient's last menstrual period was 02/01/2013. Contraception: none Last Pap: n/a. Results were: n/a Last mammogram: n/a. Results were: n/a  Obstetric History OB History  Gravida Para Term Preterm AB SAB TAB Ectopic Multiple Living  2 1 0 1 1 1 0 0 1 1     # Outcome Date GA Lbr Len/2nd Weight Sex Delivery Anes PTL Lv  2 PRE 09/01/12 [redacted]w[redacted]d   F LTCS Spinal  Y  1 SAB 09/16/09 [redacted]w[redacted]d            Comments: Twin gestational sacs.        The following portions of the patient's history were reviewed and updated as appropriate: allergies, current medications, past family history, past medical history, past social history, past surgical history and problem list.  Review of Systems Pertinent items are noted in HPI.    Objective:    General appearance: alert and no distress Abdomen: normal findings: soft, non-tender Pelvic: cervix normal in appearance, external genitalia normal, no adnexal masses or tenderness, no cervical motion tenderness, rectovaginal septum normal, uterus normal size, shape, and consistency and vagina normal without discharge    Assessment:    Healthy female exam.    Plan:    Education reviewed: safe sex/STD prevention. Contraception: none. Follow up in: several months.

## 2013-02-13 ENCOUNTER — Encounter: Payer: Self-pay | Admitting: Obstetrics

## 2013-02-13 LAB — WET PREP BY MOLECULAR PROBE
Candida species: NEGATIVE
Trichomonas vaginosis: NEGATIVE

## 2013-02-13 LAB — GC/CHLAMYDIA PROBE AMP
CT Probe RNA: NEGATIVE
GC Probe RNA: NEGATIVE

## 2013-02-21 ENCOUNTER — Ambulatory Visit: Payer: Medicaid Other | Admitting: Obstetrics

## 2013-03-01 ENCOUNTER — Encounter (HOSPITAL_COMMUNITY): Payer: Self-pay | Admitting: Family

## 2013-03-01 ENCOUNTER — Inpatient Hospital Stay (HOSPITAL_COMMUNITY)
Admission: AD | Admit: 2013-03-01 | Discharge: 2013-03-01 | Disposition: A | Discharge status: Home / Self Care | Payer: Medicaid Other | Source: Ambulatory Visit | Attending: Obstetrics | Admitting: Obstetrics

## 2013-03-01 DIAGNOSIS — Z3202 Encounter for pregnancy test, result negative: Secondary | ICD-10-CM | POA: Insufficient documentation

## 2013-03-01 DIAGNOSIS — R55 Syncope and collapse: Secondary | ICD-10-CM | POA: Insufficient documentation

## 2013-03-01 LAB — URINALYSIS, ROUTINE W REFLEX MICROSCOPIC
Bilirubin Urine: NEGATIVE
Hgb urine dipstick: NEGATIVE
Ketones, ur: NEGATIVE mg/dL
Protein, ur: NEGATIVE mg/dL
Urobilinogen, UA: 0.2 mg/dL (ref 0.0–1.0)

## 2013-03-01 NOTE — MAU Provider Note (Signed)
History     CSN: [id number]  Arrival date and time: 03/01/13 1210   First Provider Initiated Contact with Patient 03/01/13 1417      Chief Complaint  Patient presents with  . Possible Pregnancy   HPI Comments: Stephanie Mack 22 y.o. [id number] presents to MAU for syncopal episode. She delivered last summer, was placed on labetalol 400 mg BID for preeclampsia and never taken off. She is also concerned she may be pregnant. She will be seeing Dr Clearance Coots on 03/05/13      Possible Pregnancy      Past Medical History  Diagnosis Date  . Depression     depression  . Bipolar disorder   . ADHD (attention deficit hyperactivity disorder)   . Anxiety   . Anemia   . ADHD (attention deficit hyperactivity disorder)   . Bipolar 1 disorder   . Chlamydia 05-31-10  . Pseudocyesis 2013    Seen in MAU for percieved FM, abd distension. Normal exam.   . Gonorrhea contact, treated   . Pregnancy induced hypertension     Past Surgical History  Procedure Laterality Date  . Nasal septum surgery    . Cesarean section N/A 09/01/2012    Procedure:  Primary cesarean section with delivery of baby girl at 1741.  Apgars 1/1.  ;  Surgeon: Kathreen Cosier, MD;  Location: [facility];  Service: Obstetrics;  Laterality: N/A;    Family History  Problem Relation Age of Onset  . Kidney disease Mother   . Hypertension Father   . Cancer Sister   . Asthma Brother   . Heart disease Neg Hx   . Diabetes Maternal Grandfather     History  Substance Use Topics  . Smoking status: Never Smoker   . Smokeless tobacco: Never Used  . Alcohol Use: No    Allergies: No Known Allergies  Facility-administered medications prior to admission  Medication Dose Route Frequency Provider Last Rate Last Dose  . medroxyPROGESTERone (DEPO-PROVERA) injection 150 mg  150 mg Intramuscular Q90 days Brock Bad, MD   150 mg at 09/18/12 1352   Prescriptions prior to admission  Medication Sig Dispense Refill  . doxycycline  (VIBRAMYCIN) 100 MG capsule Take 1 capsule (100 mg total) by mouth 2 (two) times daily.  14 capsule  0  . labetalol (NORMODYNE) 100 MG tablet Take 400 mg by mouth 2 (two) times daily. Called pharmacy to get strength      . metroNIDAZOLE (FLAGYL) 500 MG tablet Take 1 tablet (500 mg total) by mouth 2 (two) times daily.  14 tablet  0  . ondansetron (ZOFRAN ODT) 8 MG disintegrating tablet Take 1 tablet (8 mg total) by mouth every 8 (eight) hours as needed for nausea.  30 tablet  2    Review of Systems  Constitutional: Negative.   HENT: Negative.   Eyes: Negative.   Respiratory: Negative.   Cardiovascular: Negative.   Gastrointestinal: Negative.   Genitourinary: Negative.   Musculoskeletal: Negative.   Skin: Negative.   Neurological: Positive for sensory change.       Syncopal episode  Psychiatric/Behavioral: Negative.    Physical Exam   Blood pressure 120/84, pulse 88, temperature 97.2 F (36.2 C), temperature source Oral, resp. rate 16, last menstrual period 02/01/2013.  Physical Exam  Constitutional: She is oriented to person, place, and time. She appears well-developed and well-nourished.  HENT:  Head: Normocephalic and atraumatic.  Eyes: Pupils are equal, round, and reactive to light.  Cardiovascular: Normal rate, regular  rhythm and normal heart sounds.   Respiratory: Effort normal and breath sounds normal.  GI: Soft. Bowel sounds are normal. She exhibits no distension and no mass. There is no tenderness. There is no rebound and no guarding.  Neurological: She is alert and oriented to person, place, and time.  Skin: Skin is warm and dry.  Psychiatric: She is slowed.    MAU Course  Procedures  MDM   Assessment and Plan   A: Syncopal Episode  P: Discontinue labetalol / go to one tablet per day for 5 days then off Follow up with Dr Clent Ridges, Rubbie Battiest 03/01/2013, 2:33 PM

## 2013-03-01 NOTE — MAU Note (Addendum)
22 yo, G2P1, presents to MAU with c/o N/V (two emesis occurences in last 24 hrs). Reports she blacked out after getting sick yesterday. Denies injuries. Reports she is on BP medication since this summer when she had eclampsia with 6 month IUFD. Patient is concerned she is possibly pregnant because she had similar episode with last pregnancy. Patient is also unsure if her BP medication is okay. Patient has an appointment scheduled for Dr. Clearance Coots on 12/2.

## 2013-03-05 ENCOUNTER — Encounter: Payer: Self-pay | Admitting: Obstetrics

## 2013-03-05 ENCOUNTER — Ambulatory Visit (INDEPENDENT_AMBULATORY_CARE_PROVIDER_SITE_OTHER): Payer: Medicaid Other | Admitting: Obstetrics

## 2013-03-05 VITALS — BP 133/83 | HR 105 | Temp 97.8°F | Ht 60.0 in | Wt 100.0 lb

## 2013-03-05 DIAGNOSIS — R109 Unspecified abdominal pain: Secondary | ICD-10-CM

## 2013-03-05 DIAGNOSIS — Z3202 Encounter for pregnancy test, result negative: Secondary | ICD-10-CM

## 2013-03-05 DIAGNOSIS — N760 Acute vaginitis: Secondary | ICD-10-CM

## 2013-03-05 DIAGNOSIS — Z113 Encounter for screening for infections with a predominantly sexual mode of transmission: Secondary | ICD-10-CM

## 2013-03-05 LAB — POCT URINALYSIS DIPSTICK
Blood, UA: NEGATIVE
Glucose, UA: NEGATIVE
Nitrite, UA: NEGATIVE

## 2013-03-05 LAB — POCT URINE PREGNANCY: Preg Test, Ur: NEGATIVE

## 2013-03-05 NOTE — Progress Notes (Signed)
Subjective:     Stephanie Mack is a 22 y.o. female here for a problem visit.  Current complaints: Pt reports stomach pain. Pt denies nausea and vomiting. Pt reports dizziness at times as well as breast tenderness. Pt states pain is mostly in her lower abdomen.  Personal health questionnaire reviewed: yes.   Gynecologic History Patient's last menstrual period was 02/01/2013. Contraception: none Last Pap: n/a.  Last mammogram: n/a   Obstetric History OB History  Gravida Para Term Preterm AB SAB TAB Ectopic Multiple Living  2 1 0 1 1 1 0 0 1 1     # Outcome Date GA Lbr Len/2nd Weight Sex Delivery Anes PTL Lv  2 PRE 09/01/12 [redacted]w[redacted]d   F LTCS Spinal  N  1 SAB 09/16/09 [redacted]w[redacted]d            Comments: Twin gestational sacs.        The following portions of the patient's history were reviewed and updated as appropriate: allergies, current medications, past family history, past medical history, past social history, past surgical history and problem list.  Review of Systems Pertinent items are noted in HPI.    Objective:    General appearance: alert and no distress Abdomen: normal findings: soft, non-tender Pelvic: cervix normal in appearance, external genitalia normal, no adnexal masses or tenderness, no cervical motion tenderness, rectovaginal septum normal, uterus normal size, shape, and consistency and vagina normal without discharge    Assessment:    Healthy female exam.   Probable Viral Syndrome.   Plan:    Education reviewed: safe sex/STD prevention and contraceptive options.. Contraception: none. Follow up in: several months.

## 2013-03-06 LAB — GC/CHLAMYDIA PROBE AMP
CT Probe RNA: NEGATIVE
GC Probe RNA: NEGATIVE

## 2013-03-06 LAB — URINE CULTURE: Colony Count: >=100000

## 2013-03-14 ENCOUNTER — Ambulatory Visit (INDEPENDENT_AMBULATORY_CARE_PROVIDER_SITE_OTHER): Payer: Medicaid Other | Admitting: *Deleted

## 2013-03-14 VITALS — BP 124/87 | HR 82 | Temp 97.0°F | Wt 97.0 lb

## 2013-03-14 DIAGNOSIS — Z3049 Encounter for surveillance of other contraceptives: Secondary | ICD-10-CM

## 2013-03-14 MED ORDER — MEDROXYPROGESTERONE ACETATE 150 MG/ML IM SUSP
150.0000 mg | INTRAMUSCULAR | Status: AC
  Administered 2013-03-14: 150 mg via INTRAMUSCULAR

## 2013-03-14 NOTE — Progress Notes (Signed)
Pt in the office today for a pregnancy test and Depo. Pt states she last had intercourse on 03-12-13. Pt states she started her cycle on 03-11-13. Per Dr. Clearance Coots ok to give Depo injection.  Pt tolerated injection well. Pt to return to office on 06-05-13 for next injection.

## 2013-03-19 ENCOUNTER — Other Ambulatory Visit: Payer: Self-pay | Admitting: *Deleted

## 2013-03-19 DIAGNOSIS — B9689 Other specified bacterial agents as the cause of diseases classified elsewhere: Secondary | ICD-10-CM

## 2013-03-19 MED ORDER — METRONIDAZOLE 500 MG PO TABS: 500.0000 mg | ORAL_TABLET | Freq: Two times a day (BID) | ORAL | Status: DC

## 2013-03-20 ENCOUNTER — Emergency Department (HOSPITAL_COMMUNITY)
Admission: EM | Admit: 2013-03-20 | Discharge: 2013-03-20 | Disposition: A | Discharge status: Home / Self Care | Payer: Medicaid Other | Attending: Emergency Medicine | Admitting: Emergency Medicine

## 2013-03-20 ENCOUNTER — Encounter (HOSPITAL_COMMUNITY): Payer: Self-pay | Admitting: Emergency Medicine

## 2013-03-20 DIAGNOSIS — Z8659 Personal history of other mental and behavioral disorders: Secondary | ICD-10-CM | POA: Insufficient documentation

## 2013-03-20 DIAGNOSIS — S335XXA Sprain of ligaments of lumbar spine, initial encounter: Secondary | ICD-10-CM | POA: Insufficient documentation

## 2013-03-20 DIAGNOSIS — Y92410 Unspecified street and highway as the place of occurrence of the external cause: Secondary | ICD-10-CM | POA: Insufficient documentation

## 2013-03-20 DIAGNOSIS — S39012A Strain of muscle, fascia and tendon of lower back, initial encounter: Secondary | ICD-10-CM

## 2013-03-20 DIAGNOSIS — Z8619 Personal history of other infectious and parasitic diseases: Secondary | ICD-10-CM | POA: Insufficient documentation

## 2013-03-20 DIAGNOSIS — Z8679 Personal history of other diseases of the circulatory system: Secondary | ICD-10-CM | POA: Insufficient documentation

## 2013-03-20 DIAGNOSIS — Y9389 Activity, other specified: Secondary | ICD-10-CM | POA: Insufficient documentation

## 2013-03-20 DIAGNOSIS — Z792 Long term (current) use of antibiotics: Secondary | ICD-10-CM | POA: Insufficient documentation

## 2013-03-20 DIAGNOSIS — Z862 Personal history of diseases of the blood and blood-forming organs and certain disorders involving the immune mechanism: Secondary | ICD-10-CM | POA: Insufficient documentation

## 2013-03-20 DIAGNOSIS — Z79899 Other long term (current) drug therapy: Secondary | ICD-10-CM | POA: Insufficient documentation
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
## 2013-03-23 NOTE — ED Provider Notes (Signed)
Medical screening examination/treatment/procedure(s) were performed by non-physician practitioner and as supervising physician I was immediately available for consultation/collaboration.  Estephanie Hubbs T Dilan Fullenwider, MD 03/23/13 1627 

## 2013-04-09 ENCOUNTER — Encounter (HOSPITAL_COMMUNITY): Payer: Self-pay | Admitting: Emergency Medicine

## 2013-04-09 ENCOUNTER — Emergency Department (INDEPENDENT_AMBULATORY_CARE_PROVIDER_SITE_OTHER)
Admission: EM | Admit: 2013-04-09 | Discharge: 2013-04-09 | Disposition: A | Discharge status: Home / Self Care | Payer: Medicaid Other | Source: Home / Self Care

## 2013-04-09 DIAGNOSIS — N912 Amenorrhea, unspecified: Secondary | ICD-10-CM

## 2013-04-09 DIAGNOSIS — R109 Unspecified abdominal pain: Secondary | ICD-10-CM

## 2013-04-09 DIAGNOSIS — R1110 Vomiting, unspecified: Secondary | ICD-10-CM

## 2013-04-09 DIAGNOSIS — J069 Acute upper respiratory infection, unspecified: Secondary | ICD-10-CM

## 2013-04-09 LAB — POCT URINALYSIS DIP (DEVICE)
Bilirubin Urine: NEGATIVE
GLUCOSE, UA: NEGATIVE mg/dL
Hgb urine dipstick: NEGATIVE
Ketones, ur: NEGATIVE mg/dL
Nitrite: NEGATIVE
PH: 6 (ref 5.0–8.0)
Protein, ur: 100 mg/dL — AB
Urobilinogen, UA: 0.2 mg/dL (ref 0.0–1.0)

## 2013-04-09 LAB — POCT RAPID STREP A: Streptococcus, Group A Screen (Direct): NEGATIVE

## 2013-04-09 LAB — HCG, SERUM, QUALITATIVE: PREG SERUM: NEGATIVE

## 2013-04-09 LAB — POCT PREGNANCY, URINE: Preg Test, Ur: NEGATIVE

## 2013-04-09 MED ORDER — ONDANSETRON HCL 4 MG PO TABS: 4.0000 mg | ORAL_TABLET | Freq: Four times a day (QID) | ORAL | Status: DC

## 2013-04-09 NOTE — ED Notes (Signed)
Pt c/o vomiting, HA, diarrhea, runny nose, cough, congestion, abd pain Denies: fevers... LMP = 02/06/13 She is alert w/no signs of acute distress.

## 2013-04-09 NOTE — ED Provider Notes (Signed)
CSN: [id number]     Arrival date & time 04/09/13  1435 History   First MD Initiated Contact with Patient 04/09/13 1627     Chief Complaint  Patient presents with  . URI   (Consider location/radiation/quality/duration/timing/severity/associated sxs/prior Treatment) HPI Comments: 23 year old female is been complaining of not feeling well for one week. She is having off and on headaches, cough, nasal congestion and runny nose, cough or known abdominal pain and vomiting or daily basis for the past 12 days. She denies fever.   Past Medical History  Diagnosis Date  . Depression     depression  . Bipolar disorder   . ADHD (attention deficit hyperactivity disorder)   . Anxiety   . Anemia   . ADHD (attention deficit hyperactivity disorder)   . Bipolar 1 disorder   . Chlamydia 05-31-10  . Pseudocyesis 2013    Seen in MAU for percieved FM, abd distension. Normal exam.   . Gonorrhea contact, treated   . Pregnancy induced hypertension    Past Surgical History  Procedure Laterality Date  . Nasal septum surgery    . Cesarean section N/A 09/01/2012    Procedure:  Primary cesarean section with delivery of baby girl at 50.  Apgars 1/1.  ;  Surgeon: Frederico Hamman, MD;  Location: [facility];  Service: Obstetrics;  Laterality: N/A;   Family History  Problem Relation Age of Onset  . Kidney disease Mother   . Hypertension Father   . Cancer Sister   . Asthma Brother   . Heart disease Neg Hx   . Diabetes Maternal Grandfather    History  Substance Use Topics  . Smoking status: Never Smoker   . Smokeless tobacco: Never Used  . Alcohol Use: No   OB History   Grav Para Term Preterm Abortions TAB SAB Ect Mult Living   2 1 0 1 1 0 1 0 0 0      Review of Systems  Constitutional: Positive for activity change and appetite change. Negative for fever, chills and fatigue.  HENT: Positive for congestion, postnasal drip and rhinorrhea. Negative for facial swelling.   Eyes: Negative.   Respiratory:  Positive for cough. Negative for shortness of breath, wheezing and stridor.   Cardiovascular: Negative.   Gastrointestinal: Positive for nausea and vomiting. Negative for diarrhea, constipation and blood in stool.  Genitourinary: Negative for dysuria, flank pain, vaginal bleeding, vaginal discharge, difficulty urinating and pelvic pain.  Musculoskeletal: Negative for neck pain and neck stiffness.  Skin: Negative for pallor and rash.  Neurological: Negative.     Allergies  Review of patient's allergies indicates no known allergies.  Home Medications   Current Outpatient Rx  Name  Route  Sig  Dispense  Refill  . medroxyPROGESTERone (DEPO-PROVERA) 150 MG/ML injection   Intramuscular   Inject 150 mg into the muscle every 3 (three) months.         . metroNIDAZOLE (FLAGYL) 500 MG tablet   Oral   Take 1 tablet (500 mg total) by mouth 2 (two) times daily.   14 tablet   0   . ondansetron (ZOFRAN) 4 MG tablet   Oral   Take 1 tablet (4 mg total) by mouth every 6 (six) hours.   12 tablet   0    BP 125/84  Pulse 96  Temp(Src) 98.4 F (36.9 C) (Oral)  Resp 18  SpO2 100%  LMP 02/06/2013 Physical Exam  Nursing note and vitals reviewed. Constitutional: She is oriented to person, place,  and time. She appears well-developed and well-nourished. No distress.  HENT:  Bilateral TMs are normal Oropharynx with enlarged erythematous bilateral tonsils.  Eyes: Conjunctivae and EOM are normal.  Neck: Normal range of motion. Neck supple.  Cardiovascular: Normal rate, regular rhythm and normal heart sounds.   Pulmonary/Chest: Effort normal and breath sounds normal. No respiratory distress. She has no wheezes. She has no rales.  Abdominal: Soft. Bowel sounds are normal.  No distention however there is a protuberance from the suprapubic is to the umbilicus. Mild tenderness below the umbilicus. She had a cesarean section in May and 2014. She states that she believes that this protuberance as a  result of that surgery.   Musculoskeletal: Normal range of motion. She exhibits no edema.  Lymphadenopathy:    She has no cervical adenopathy.  Neurological: She is alert and oriented to person, place, and time.  Skin: Skin is warm and dry. No rash noted.  Psychiatric: She has a normal mood and affect.    ED Course  Procedures (including critical care time) Labs Review Labs Reviewed  POCT URINALYSIS DIP (DEVICE) - Abnormal; Notable for the following:    Protein, ur 100 (*)    Leukocytes, UA SMALL (*)    All other components within normal limits  HCG, SERUM, QUALITATIVE  POCT PREGNANCY, URINE  POCT RAPID STREP A ([facility] ONLY)   Imaging Review No results found.  Results for orders placed during the hospital encounter of 04/09/13  POCT URINALYSIS DIP (DEVICE)      Result Value Range   Glucose, UA NEGATIVE  NEGATIVE mg/dL   Bilirubin Urine NEGATIVE  NEGATIVE   Ketones, ur NEGATIVE  NEGATIVE mg/dL   Specific Gravity, Urine >=1.030  1.005 - 1.030   Hgb urine dipstick NEGATIVE  NEGATIVE   pH 6.0  5.0 - 8.0   Protein, ur 100 (*) NEGATIVE mg/dL   Urobilinogen, UA 0.2  0.0 - 1.0 mg/dL   Nitrite NEGATIVE  NEGATIVE   Leukocytes, UA SMALL (*) NEGATIVE  POCT PREGNANCY, URINE      Result Value Range   Preg Test, Ur NEGATIVE  NEGATIVE  POCT RAPID STREP A ([facility] ONLY)      Result Value Range   Streptococcus, Group A Screen (Direct) NEGATIVE  NEGATIVE     MDM   1. URI (upper respiratory infection)   2. Vomiting   3. Amenorrhea   4. Abdominal pain in female patient       Patient has typical URI symptoms however, she is day she has vomited on a daily basis for the past 12 days. With her last menstrual period being November 5 I suspect she may be pregnant. Her urine pregnancy test is negative, we will draw a serum qualitative hCG. Zofran as directed for nausea and vomiting Claritin for drainage and Sudafed PE 10 mg for congestion and saline nasal spray  frequently.    Janne Napoleon, NP 04/09/13 1726

## 2013-04-09 NOTE — Discharge Instructions (Signed)
Abdominal Pain, Women °Abdominal (stomach, pelvic, or belly) pain can be caused by many things. It is important to tell your doctor: °· The location of the pain. °· Does it come and go or is it present all the time? °· Are there things that start the pain (eating certain foods, exercise)? °· Are there other symptoms associated with the pain (fever, nausea, vomiting, diarrhea)? °All of this is helpful to know when trying to find the cause of the pain. °CAUSES  °· Stomach: virus or bacteria infection, or ulcer. °· Intestine: appendicitis (inflamed appendix), regional ileitis (Crohn's disease), ulcerative colitis (inflamed colon), irritable bowel syndrome, diverticulitis (inflamed diverticulum of the colon), or cancer of the stomach or intestine. °· Gallbladder disease or stones in the gallbladder. °· Kidney disease, kidney stones, or infection. °· Pancreas infection or cancer. °· Fibromyalgia (pain disorder). °· Diseases of the female organs: °· Uterus: fibroid (non-cancerous) tumors or infection. °· Fallopian tubes: infection or tubal pregnancy. °· Ovary: cysts or tumors. °· Pelvic adhesions (scar tissue). °· Endometriosis (uterus lining tissue growing in the pelvis and on the pelvic organs). °· Pelvic congestion syndrome (female organs filling up with blood just before the menstrual period). °· Pain with the menstrual period. °· Pain with ovulation (producing an egg). °· Pain with an IUD (intrauterine device, birth control) in the uterus. °· Cancer of the female organs. °· Functional pain (pain not caused by a disease, may improve without treatment). °· Psychological pain. °· Depression. °DIAGNOSIS  °Your doctor will decide the seriousness of your pain by doing an examination. °· Blood tests. °· X-rays. °· Ultrasound. °· CT scan (computed tomography, special type of X-ray). °· MRI (magnetic resonance imaging). °· Cultures, for infection. °· Barium enema (dye inserted in the large intestine, to better view it with  X-rays). °· Colonoscopy (looking in intestine with a lighted tube). °· Laparoscopy (minor surgery, looking in abdomen with a lighted tube). °· Major abdominal exploratory surgery (looking in abdomen with a large incision). °TREATMENT  °The treatment will depend on the cause of the pain.  °· Many cases can be observed and treated at home. °· Over-the-counter medicines recommended by your caregiver. °· Prescription medicine. °· Antibiotics, for infection. °· Birth control pills, for painful periods or for ovulation pain. °· Hormone treatment, for endometriosis. °· Nerve blocking injections. °· Physical therapy. °· Antidepressants. °· Counseling with a psychologist or psychiatrist. °· Minor or major surgery. °HOME CARE INSTRUCTIONS  °· Do not take laxatives, unless directed by your caregiver. °· Take over-the-counter pain medicine only if ordered by your caregiver. Do not take aspirin because it can cause an upset stomach or bleeding. °· Try a clear liquid diet (broth or water) as ordered by your caregiver. Slowly move to a bland diet, as tolerated, if the pain is related to the stomach or intestine. °· Have a thermometer and take your temperature several times a day, and record it. °· Bed rest and sleep, if it helps the pain. °· Avoid sexual intercourse, if it causes pain. °· Avoid stressful situations. °· Keep your follow-up appointments and tests, as your caregiver orders. °· If the pain does not go away with medicine or surgery, you may try: °· Acupuncture. °· Relaxation exercises (yoga, meditation). °· Group therapy. °· Counseling. °SEEK MEDICAL CARE IF:  °· You notice certain foods cause stomach pain. °· Your home care treatment is not helping your pain. °· You need stronger pain medicine. °· You want your IUD removed. °· You feel faint or   lightheaded. °· You develop nausea and vomiting. °· You develop a rash. °· You are having side effects or an allergy to your medicine. °SEEK IMMEDIATE MEDICAL CARE IF:  °· Your  pain does not go away or gets worse. °· You have a fever. °· Your pain is felt only in portions of the abdomen. The right side could possibly be appendicitis. The left lower portion of the abdomen could be colitis or diverticulitis. °· You are passing blood in your stools (bright red or black tarry stools, with or without vomiting). °· You have blood in your urine. °· You develop chills, with or without a fever. °· You pass out. °MAKE SURE YOU:  °· Understand these instructions. °· Will watch your condition. °· Will get help right away if you are not doing well or get worse. °Document Released: 01/16/2007 Document Revised: 06/13/2011 Document Reviewed: 02/05/2009 °[employer]® Patient Information ©2014 [employer]. °Nausea and Vomiting °Nausea is a sick feeling that often comes before throwing up (vomiting). Vomiting is a reflex where stomach contents come out of your mouth. Vomiting can cause severe loss of body fluids (dehydration). Children and elderly adults can become dehydrated quickly, especially if they also have diarrhea. Nausea and vomiting are symptoms of a condition or disease. It is important to find the cause of your symptoms. °CAUSES  °· Direct irritation of the stomach lining. This irritation can result from increased acid production (gastroesophageal reflux disease), infection, food poisoning, taking certain medicines (such as nonsteroidal anti-inflammatory drugs), alcohol use, or tobacco use. °· Signals from the brain. These signals could be caused by a headache, heat exposure, an inner ear disturbance, increased pressure in the brain from injury, infection, a tumor, or a concussion, pain, emotional stimulus, or metabolic problems. °· An obstruction in the gastrointestinal tract (bowel obstruction). °· Illnesses such as diabetes, hepatitis, gallbladder problems, appendicitis, kidney problems, cancer, sepsis, atypical symptoms of a heart attack, or eating disorders. °· Medical treatments such as  chemotherapy and radiation. °· Receiving medicine that makes you sleep (general anesthetic) during surgery. °DIAGNOSIS °Your caregiver may ask for tests to be done if the problems do not improve after a few days. Tests may also be done if symptoms are severe or if the reason for the nausea and vomiting is not clear. Tests may include: °· Urine tests. °· Blood tests. °· Stool tests. °· Cultures (to look for evidence of infection). °· X-rays or other imaging studies. °Test results can help your caregiver make decisions about treatment or the need for additional tests. °TREATMENT °You need to stay well hydrated. Drink frequently but in small amounts. You may wish to drink water, sports drinks, clear broth, or eat frozen ice pops or gelatin dessert to help stay hydrated. When you eat, eating slowly may help prevent nausea. There are also some antinausea medicines that may help prevent nausea. °HOME CARE INSTRUCTIONS  °· Take all medicine as directed by your caregiver. °· If you do not have an appetite, do not force yourself to eat. However, you must continue to drink fluids. °· If you have an appetite, eat a normal diet unless your caregiver tells you differently. °· Eat a variety of complex carbohydrates (rice, wheat, potatoes, bread), lean meats, yogurt, fruits, and vegetables. °· Avoid high-fat foods because they are more difficult to digest. °· Drink enough water and fluids to keep your urine clear or pale yellow. °· If you are dehydrated, ask your caregiver for specific rehydration instructions. Signs of dehydration may include: °· Severe thirst. °· Dry   Dry lips and mouth.  Dizziness.  Dark urine.  Decreasing urine frequency and amount.  Confusion.  Rapid breathing or pulse. SEEK IMMEDIATE MEDICAL CARE IF:   You have blood or brown flecks (like coffee grounds) in your vomit.  You have black or bloody stools.  You have a severe headache or stiff neck.  You are confused.  You have severe abdominal  pain.  You have chest pain or trouble breathing.  You do not urinate at least once every 8 hours.  You develop cold or clammy skin.  You continue to vomit for longer than 24 to 48 hours.  You have a fever. MAKE SURE YOU:   Understand these instructions.  Will watch your condition.  Will get help right away if you are not doing well or get worse. Document Released: 03/21/2005 Document Revised: 06/13/2011 Document Reviewed: 08/18/2010 [facility] Patient Information 2014 [place], [place].  Secondary Amenorrhea  Secondary amenorrhea is the stopping of menstrual flow for 3 6 months in a female who has previously had periods. There are many possible causes. Most of these causes are not serious. Usually, treating the underlying problem causing the loss of menses will return your periods to normal. CAUSES  Some common and uncommon causes of not menstruating include:  Malnutrition.  Low blood sugar (hypoglycemia).  Polycystic ovary disease.  Stress or fear.  Breastfeeding.  Hormone imbalance.  Ovarian failure.  Medicines.  Extreme obesity.  Cystic fibrosis.  Low body weight or drastic weight reduction from any cause.  Early menopause.  Removal of ovaries or uterus.  Contraceptives.  Illness.  Long-term (chronic) illnesses.  Cushing syndrome.  Thyroid problems.  Birth control pills, patches, or vaginal rings for birth control. RISK FACTORS You may be at greater risk of secondary amenorrhea if:  You have a family history of this condition.  You have an eating disorder.  You do athletic training. DIAGNOSIS  A diagnosis is made by your health care provider taking a medical history and doing a physical exam. This will include a pelvic exam to check for problems with your reproductive organs. Pregnancy must be ruled out. Often, numerous blood tests are done to measure different hormones in the body. Urine testing may be done. Specialized exams (ultrasound, CT  scan, MRI, or hysteroscopy) may have to be done as well as measuring the body mass index (BMI). TREATMENT  Treatment depends on the cause of the amenorrhea. If an eating disorder is present, this can be treated with an adequate diet and therapy. Chronic illnesses may improve with treatment of the illness. Amenorrhea may be corrected with medicines, lifestyle changes, or surgery. If the amenorrhea cannot be corrected, it is sometimes possible to create a false menstruation with medicines. HOME CARE INSTRUCTIONS  Maintain a healthy diet.  Manage weight problems.  Exercise regularly but not excessively.  Get adequate sleep.  Manage stress.  Be aware of changes in your menstrual cycle. Keep a record of when your periods occur. Note the date your period starts, how long it lasts, and any problems. SEEK MEDICAL CARE IF: Your symptoms do not get better with treatment. Document Released: 05/02/2006 Document Revised: 11/21/2012 Document Reviewed: 09/06/2012 [facility] Patient Information 2014 [place], [place].  Upper Respiratory Infection, Adult Saline nasal spray claritin for drainage Sudafed PE 10 mg evevry 6 hours as needed for congestion An upper respiratory infection (URI) is also known as the common cold. It is often caused by a type of germ (virus). Colds are easily spread (contagious). You can  pass it to others by kissing, coughing, sneezing, or drinking out of the same glass. Usually, you get better in 1 or 2 weeks.  HOME CARE   Only take medicine as told by your doctor.  Use a warm mist humidifier or breathe in steam from a hot shower.  Drink enough water and fluids to keep your pee (urine) clear or pale yellow.  Get plenty of rest.  Return to work when your temperature is back to normal or as told by your doctor. You may use a face mask and wash your hands to stop your cold from spreading. GET HELP RIGHT AWAY IF:   After the first few days, you feel you are getting worse.  You  have questions about your medicine.  You have chills, shortness of breath, or brown or red spit (mucus).  You have yellow or brown snot (nasal discharge) or pain in the face, especially when you bend forward.  You have a fever, puffy (swollen) neck, pain when you swallow, or white spots in the back of your throat.  You have a bad headache, ear pain, sinus pain, or chest pain.  You have a high-pitched whistling sound when you breathe in and out (wheezing).  You have a lasting cough or cough up blood.  You have sore muscles or a stiff neck. MAKE SURE YOU:   Understand these instructions.  Will watch your condition.  Will get help right away if you are not doing well or get worse. Document Released: 09/07/2007 Document Revised: 06/13/2011 Document Reviewed: 07/26/2010 [facility] Patient Information 2014 [place], [place].

## 2013-04-09 NOTE — ED Notes (Signed)
Call back number verified.

## 2013-04-10 NOTE — ED Provider Notes (Signed)
Medical screening examination/treatment/procedure(s) were performed by a resident physician or non-physician practitioner and as the supervising physician I was immediately available for consultation/collaboration.  Khalen Styer, MD    Sharren Schnurr S Genetta Fiero, MD 04/10/13 0803 

## 2013-04-11 LAB — CULTURE, GROUP A STREP

## 2013-04-16 ENCOUNTER — Ambulatory Visit (INDEPENDENT_AMBULATORY_CARE_PROVIDER_SITE_OTHER): Payer: Medicaid Other | Admitting: Obstetrics

## 2013-04-16 ENCOUNTER — Encounter: Payer: Self-pay | Admitting: Obstetrics

## 2013-04-16 VITALS — BP 140/89 | HR 128 | Temp 98.3°F | Ht 60.0 in | Wt 96.0 lb

## 2013-04-16 DIAGNOSIS — Z309 Encounter for contraceptive management, unspecified: Secondary | ICD-10-CM

## 2013-04-16 DIAGNOSIS — IMO0001 Reserved for inherently not codable concepts without codable children: Secondary | ICD-10-CM

## 2013-04-16 DIAGNOSIS — Z3202 Encounter for pregnancy test, result negative: Secondary | ICD-10-CM

## 2013-04-16 LAB — POCT URINE PREGNANCY: Preg Test, Ur: NEGATIVE

## 2013-04-16 NOTE — Progress Notes (Signed)
Subjective:     Stephanie Mack is a 23 y.o. female here for a routine exam.  Current complaints: Patient states she is not feeling well denies discharge, itching or odor.  Personal health questionnaire reviewed: yes.   Gynecologic History Patient's last menstrual period was 02/06/2013. Contraception: none Last Pap: n/a. Results were: n/a Last mammogram: n/a. Results were: n/a  Obstetric History OB History  Gravida Para Term Preterm AB SAB TAB Ectopic Multiple Living  2 1 0 1 1 1 0 0 0 0     # Outcome Date GA Lbr Len/2nd Weight Sex Delivery Anes PTL Lv  2 PRE 09/01/12 [redacted]w[redacted]d   F LTCS Spinal  N  1 SAB 09/16/09 [redacted]w[redacted]d            Comments: Twin gestational sacs.        The following portions of the patient's history were reviewed and updated as appropriate: allergies, current medications, past family history, past medical history, past social history, past surgical history and problem list.  Review of Systems Pertinent items are noted in HPI.    Objective:    No exam performed today, Patient here for lab only..    Assessment:    R/O pregnancy.  UCG negative.   Plan:    F/U prn.

## 2013-04-17 ENCOUNTER — Encounter: Payer: Self-pay | Admitting: Obstetrics

## 2013-04-24 ENCOUNTER — Encounter: Payer: Self-pay | Admitting: Obstetrics

## 2013-05-02 ENCOUNTER — Ambulatory Visit: Payer: Medicaid Other | Admitting: Obstetrics

## 2013-05-03 ENCOUNTER — Encounter: Payer: Self-pay | Admitting: Obstetrics

## 2013-05-03 ENCOUNTER — Ambulatory Visit (INDEPENDENT_AMBULATORY_CARE_PROVIDER_SITE_OTHER): Payer: Medicaid Other | Admitting: Obstetrics

## 2013-05-03 VITALS — BP 129/85 | HR 91 | Temp 97.8°F | Ht 60.0 in | Wt 100.0 lb

## 2013-05-03 DIAGNOSIS — N949 Unspecified condition associated with female genital organs and menstrual cycle: Secondary | ICD-10-CM | POA: Insufficient documentation

## 2013-05-03 LAB — POCT URINALYSIS DIPSTICK
Bilirubin, UA: NEGATIVE
Glucose, UA: NEGATIVE
Ketones, UA: NEGATIVE
Nitrite, UA: NEGATIVE
PH UA: 5
RBC UA: NEGATIVE
SPEC GRAV UA: 1.025
UROBILINOGEN UA: NEGATIVE

## 2013-05-03 NOTE — Progress Notes (Signed)
Subjective:     Stephanie Mack is a 23 y.o. female here for a routine exam.  Current complaints: constant abdominal pain. Patient states it is a sharp shooting pain in her lower abdomen. Patient states she has been having the pain since November 2014. Patient states ibuprofen and heating pad does not help with the pain.   Personal health questionnaire reviewed: yes.   Gynecologic History Patient's last menstrual period was 02/06/2013. Contraception: Depo-Provera injections   Obstetric History OB History  Gravida Para Term Preterm AB SAB TAB Ectopic Multiple Living  2 1 0 1 1 1 0 0 0 0     # Outcome Date GA Lbr Len/2nd Weight Sex Delivery Anes PTL Lv  2 PRE 09/01/12 [redacted]w[redacted]d   F LTCS Spinal  N  1 SAB 09/16/09 [redacted]w[redacted]d            Comments: Twin gestational sacs.        The following portions of the patient's history were reviewed and updated as appropriate: allergies, current medications, past family history, past medical history, past social history, past surgical history and problem list.  Review of Systems Pertinent items are noted in HPI.    Objective:    General appearance: alert Abdomen: normal findings: soft, non-tender Pelvic: cervix normal in appearance, external genitalia normal, no adnexal masses or tenderness, no cervical motion tenderness, positive findings: suprapubic tenderness, mild, rectovaginal septum normal, uterus normal size, shape, and consistency and vagina normal without discharge    Assessment:    Suprapubic pain.  Urine dip negative.     Plan:    Education reviewed: safe sex/STD prevention. Contraception: Depo-Provera injections.   Ultrasound ordered. Continue Ibuprofen prn. Will send urine culture.

## 2013-05-04 LAB — WET PREP BY MOLECULAR PROBE
CANDIDA SPECIES: NEGATIVE
GARDNERELLA VAGINALIS: POSITIVE — AB
Trichomonas vaginosis: NEGATIVE

## 2013-05-04 LAB — GC/CHLAMYDIA PROBE AMP
CT Probe RNA: NEGATIVE
GC PROBE AMP APTIMA: NEGATIVE

## 2013-05-04 LAB — URINE CULTURE
COLONY COUNT: NO GROWTH
Organism ID, Bacteria: NO GROWTH

## 2013-05-09 ENCOUNTER — Ambulatory Visit (HOSPITAL_COMMUNITY): Admission: RE | Admit: 2013-05-09 | Payer: Medicaid Other | Source: Ambulatory Visit

## 2013-05-09 ENCOUNTER — Other Ambulatory Visit: Payer: Self-pay | Admitting: *Deleted

## 2013-05-09 DIAGNOSIS — B9689 Other specified bacterial agents as the cause of diseases classified elsewhere: Secondary | ICD-10-CM

## 2013-05-09 DIAGNOSIS — N760 Acute vaginitis: Principal | ICD-10-CM

## 2013-05-09 MED ORDER — METRONIDAZOLE 500 MG PO TABS: 500.0000 mg | ORAL_TABLET | Freq: Two times a day (BID) | ORAL | Status: DC

## 2013-05-16 ENCOUNTER — Ambulatory Visit: Payer: Medicaid Other | Admitting: Obstetrics

## 2013-05-19 ENCOUNTER — Emergency Department (HOSPITAL_COMMUNITY)
Admission: EM | Admit: 2013-05-19 | Discharge: 2013-05-19 | Disposition: A | Discharge status: Home / Self Care | Payer: Medicaid Other | Attending: Emergency Medicine | Admitting: Emergency Medicine

## 2013-05-19 ENCOUNTER — Encounter (HOSPITAL_COMMUNITY): Payer: Self-pay | Admitting: Emergency Medicine

## 2013-05-19 DIAGNOSIS — R519 Headache, unspecified: Secondary | ICD-10-CM

## 2013-05-19 DIAGNOSIS — Z3202 Encounter for pregnancy test, result negative: Secondary | ICD-10-CM | POA: Insufficient documentation

## 2013-05-19 DIAGNOSIS — Z8619 Personal history of other infectious and parasitic diseases: Secondary | ICD-10-CM | POA: Insufficient documentation

## 2013-05-19 DIAGNOSIS — Z792 Long term (current) use of antibiotics: Secondary | ICD-10-CM | POA: Insufficient documentation

## 2013-05-19 DIAGNOSIS — R51 Headache: Secondary | ICD-10-CM | POA: Insufficient documentation

## 2013-05-19 DIAGNOSIS — F449 Dissociative and conversion disorder, unspecified: Secondary | ICD-10-CM | POA: Insufficient documentation

## 2013-05-19 DIAGNOSIS — D649 Anemia, unspecified: Secondary | ICD-10-CM | POA: Insufficient documentation

## 2013-05-19 DIAGNOSIS — F458 Other somatoform disorders: Secondary | ICD-10-CM

## 2013-05-19 DIAGNOSIS — Z79899 Other long term (current) drug therapy: Secondary | ICD-10-CM | POA: Insufficient documentation

## 2013-05-19 LAB — POCT PREGNANCY, URINE: Preg Test, Ur: NEGATIVE

## 2013-05-19 MED ORDER — ACETAMINOPHEN 325 MG PO TABS
650.0000 mg | ORAL_TABLET | Freq: Once | ORAL | Status: AC
  Administered 2013-05-19: 650 mg via ORAL
  Filled 2013-05-19: qty 2

## 2013-05-19 NOTE — Discharge Instructions (Signed)
You were seen today for multiple complaints including headache, high blood pressure, and pregnancy. Your pregnancy test in the ER is negative. Your blood pressure is within normal limits. Your physical exam is reassuring.  You should followup with her primary physician.  If you have any new or worsening symptoms she should return for further evaluation.

## 2013-05-19 NOTE — ED Provider Notes (Signed)
CSN: [id number]     Arrival date & time 05/19/13  1326 History   First MD Initiated Contact with Patient 05/19/13 1333     Chief Complaint  Patient presents with  . Headache     (Consider location/radiation/quality/duration/timing/severity/associated sxs/prior Treatment) HPI  This is a 23 year old female who presents with headache. She reports that she is 3 months pregnant with her last menstrual period 02/06/2013.   Patient states that she's had 3 days of headache. It is bitemporal . She denies any and states that she's not had a history of migraines. She's not taken any medications for fear that they would interact with pregnancy. She denies any weakness, numbness, or tingling. She denies any vision changes. Patient is being followed by her OB and reports that he has referred her here for blood pressure medicine as well. Blood pressures noted to be 128/82 in triage. She has a history of pregnancy-induced hypertension and premature delivery with fetal death.  Of note, have reviewed the patient's chart. Patient has a documented history of pseudocyesis and had a pregnancy test January 2015 was negative   Past Medical History  Diagnosis Date  . Depression     depression  . Bipolar disorder   . ADHD (attention deficit hyperactivity disorder)   . Anxiety   . Anemia   . ADHD (attention deficit hyperactivity disorder)   . Bipolar 1 disorder   . Chlamydia 05-31-10  . Pseudocyesis 2013    Seen in [facility] for percieved FM, abd distension. Normal exam.   . Gonorrhea contact, treated   . Pregnancy induced hypertension    Past Surgical History  Procedure Laterality Date  . Nasal septum surgery    . Cesarean section N/A 09/01/2012    Procedure:  Primary cesarean section with delivery of baby girl at 41.  Apgars 1/1.  ;  Surgeon: Frederico Hamman, MD;  Location: [facility];  Service: Obstetrics;  Laterality: N/A;   Family History  Problem Relation Age of Onset  . Kidney disease Mother   .  Hypertension Father   . Cancer Sister   . Asthma Brother   . Heart disease Neg Hx   . Diabetes Maternal Grandfather    History  Substance Use Topics  . Smoking status: Never Smoker   . Smokeless tobacco: Never Used  . Alcohol Use: No   OB History   Grav Para Term Preterm Abortions TAB SAB Ect Mult Living   2 1 0 1 1 0 1 0 0 0      Review of Systems  Constitutional: Negative for fever.  Eyes: Negative for photophobia.  Respiratory: Negative for cough, chest tightness and shortness of breath.   Cardiovascular: Negative for chest pain.  Gastrointestinal: Negative for nausea, vomiting and abdominal pain.  Genitourinary: Negative for dysuria, vaginal bleeding, vaginal discharge and pelvic pain.  Musculoskeletal: Negative for back pain.  Neurological: Positive for headaches. Negative for dizziness, syncope, weakness and numbness.  All other systems reviewed and are negative.      Allergies  Review of patient's allergies indicates no known allergies.  Home Medications   Current Outpatient Rx  Name  Route  Sig  Dispense  Refill  . labetalol (NORMODYNE) 100 MG tablet   Oral   Take 100 mg by mouth 2 (two) times daily.         . Prenatal Multivit-Min-Fe-FA (PRE-NATAL FORMULA PO)   Oral   Take 1 tablet by mouth daily.         Marland Kitchen  metroNIDAZOLE (FLAGYL) 500 MG tablet   Oral   Take 1 tablet (500 mg total) by mouth 2 (two) times daily.   14 tablet   0    BP 128/82  Pulse 111  Temp(Src) 97.6 F (36.4 C) (Oral)  Resp 18  Ht 5\' 4"  (1.626 m)  Wt 96 lb 3.2 oz (43.636 kg)  BMI 16.50 kg/m2  SpO2 100% Physical Exam  Nursing note and vitals reviewed. Constitutional: She is oriented to person, place, and time. She appears well-developed and well-nourished. No distress.  HENT:  Head: Normocephalic and atraumatic.  Mouth/Throat: Oropharynx is clear and moist.  Eyes: Pupils are equal, round, and reactive to light.  Neck: Neck supple.  Cardiovascular: Normal rate, regular  rhythm and normal heart sounds.   No murmur heard. Pulmonary/Chest: Effort normal and breath sounds normal. No respiratory distress. She has no wheezes.  Abdominal: Soft. Bowel sounds are normal. She exhibits no distension. There is no tenderness. There is no rebound.  Musculoskeletal: She exhibits no edema.  Neurological: She is alert and oriented to person, place, and time.  Skin: Skin is warm and dry.  Psychiatric: She has a normal mood and affect.    ED Course  Procedures (including critical care time) Labs Review Labs Reviewed  POCT PREGNANCY, URINE   Imaging Review No results found.  EKG Interpretation   None       MDM   Final diagnoses:  Headache  Pseudocyesis    Patient presents with headache. She reports that she is 3 months pregnant. Exam is benign. She is nonfocal. Patient was given Tylenol for headache. Your pregnancy test was performed to evaluate or pregnancy. Urine pregnancy is negative. Patient is insistent that she's had a positive pregnancy test at home and at urgent care or through "my blood." I have encouraged the patient to followup with Dr. Jodi Mourning who she has an appointment with on February 19. Patient stated understanding.  After history, exam, and medical workup I feel the patient has been appropriately medically screened and is safe for discharge home. Pertinent diagnoses were discussed with the patient. Patient was given return precautions.     Merryl Hacker, MD 05/19/13 [phone]

## 2013-05-19 NOTE — ED Notes (Signed)
Patient to  ED with C/O a headache.  States that she has had it for 3 days.  Patient states that she is pregnant.  Headache is all over her head. Denies nausea, vomiting.

## 2013-05-19 NOTE — ED Notes (Signed)
Pt reports shes had headaches for past 3 days, shes 3 months pregnant and she states her bp has been running high and her doctor told her to come here for bp management

## 2013-05-23 ENCOUNTER — Ambulatory Visit (HOSPITAL_COMMUNITY): Payer: Medicaid Other

## 2013-06-03 ENCOUNTER — Ambulatory Visit (INDEPENDENT_AMBULATORY_CARE_PROVIDER_SITE_OTHER): Payer: Medicaid Other | Admitting: Obstetrics

## 2013-06-03 ENCOUNTER — Encounter: Payer: Self-pay | Admitting: Obstetrics

## 2013-06-03 DIAGNOSIS — N949 Unspecified condition associated with female genital organs and menstrual cycle: Secondary | ICD-10-CM

## 2013-06-03 DIAGNOSIS — O139 Gestational [pregnancy-induced] hypertension without significant proteinuria, unspecified trimester: Secondary | ICD-10-CM | POA: Insufficient documentation

## 2013-06-03 NOTE — Progress Notes (Signed)
Pelvic pain after C/S on 05-04-13.  A/P:  Pelvic pain.  Ultrasound ordered.

## 2013-06-05 ENCOUNTER — Ambulatory Visit: Payer: Medicaid Other

## 2013-06-12 ENCOUNTER — Ambulatory Visit (INDEPENDENT_AMBULATORY_CARE_PROVIDER_SITE_OTHER): Payer: Medicaid Other

## 2013-06-12 DIAGNOSIS — N949 Unspecified condition associated with female genital organs and menstrual cycle: Secondary | ICD-10-CM

## 2013-06-13 ENCOUNTER — Encounter: Payer: Self-pay | Admitting: Obstetrics

## 2013-06-17 ENCOUNTER — Ambulatory Visit (INDEPENDENT_AMBULATORY_CARE_PROVIDER_SITE_OTHER): Payer: Medicaid Other | Admitting: Obstetrics

## 2013-06-17 ENCOUNTER — Encounter: Payer: Self-pay | Admitting: Obstetrics

## 2013-06-17 VITALS — BP 127/84 | HR 116 | Temp 98.5°F | Ht 62.0 in | Wt 99.0 lb

## 2013-06-17 DIAGNOSIS — N946 Dysmenorrhea, unspecified: Secondary | ICD-10-CM | POA: Insufficient documentation

## 2013-06-17 DIAGNOSIS — Z3202 Encounter for pregnancy test, result negative: Secondary | ICD-10-CM

## 2013-06-17 DIAGNOSIS — IMO0002 Reserved for concepts with insufficient information to code with codable children: Secondary | ICD-10-CM

## 2013-06-17 LAB — POCT URINE PREGNANCY: PREG TEST UR: NEGATIVE

## 2013-06-17 MED ORDER — OXYCODONE HCL 10 MG PO TABS: 10.0000 mg | ORAL_TABLET | Freq: Four times a day (QID) | ORAL | Status: DC | PRN

## 2013-06-17 NOTE — Progress Notes (Signed)
Subjective:     Stephanie Mack is a 23 y.o. female here for a routine exam.  Current complaints: Patient is in the office today for a follow up visit for pelvic pain. Patient states she is still having some pain but not like it was. Patient states she has been having really bad headaches. Patient states she would like a UPT preformed today. UPT preformed, results were negative. Personal health questionnaire reviewed: yes.   Gynecologic History No LMP recorded. Patient has had an injection. Contraception: none DEPO was due 06/05/2013 and patient missed her Injection. Patient states she does not want to restart depo and doesn't not want to discuss other options.  Last Pap: N/A. Results were: normal  Obstetric History OB History  Gravida Para Term Preterm AB SAB TAB Ectopic Multiple Living  2 1 0 1 1 1 0 0 0 0     # Outcome Date GA Lbr Len/2nd Weight Sex Delivery Anes PTL Lv  2 PRE 09/01/12 [redacted]w[redacted]d   F LTCS Spinal  N  1 SAB 09/16/09 [redacted]w[redacted]d            Comments: Twin gestational sacs.        The following portions of the patient's history were reviewed and updated as appropriate: allergies, current medications, past family history, past medical history, past social history, past surgical history and problem list.  Review of Systems Pertinent items are noted in HPI.    Objective:    No exam performed today, Consult only.    Assessment:    Dysmenorrhea.   Plan:    Education reviewed: safe sex/STD prevention and management of dysmenorrhea. Contraception: none. Follow up in: 3 months. Ibuprofen/Oxycodone Rx.

## 2013-06-18 ENCOUNTER — Encounter: Payer: Self-pay | Admitting: Obstetrics

## 2013-06-25 ENCOUNTER — Other Ambulatory Visit: Payer: Self-pay | Admitting: *Deleted

## 2013-06-25 DIAGNOSIS — N946 Dysmenorrhea, unspecified: Secondary | ICD-10-CM

## 2013-06-25 MED ORDER — IBUPROFEN 800 MG PO TABS: 800.0000 mg | ORAL_TABLET | Freq: Three times a day (TID) | ORAL | Status: DC | PRN

## 2013-06-26 ENCOUNTER — Encounter: Payer: Self-pay | Admitting: Obstetrics & Gynecology

## 2013-06-27 ENCOUNTER — Ambulatory Visit: Payer: Medicaid Other | Admitting: Obstetrics

## 2013-07-01 ENCOUNTER — Encounter: Payer: Self-pay | Admitting: Obstetrics

## 2013-07-01 ENCOUNTER — Ambulatory Visit (INDEPENDENT_AMBULATORY_CARE_PROVIDER_SITE_OTHER): Payer: Medicaid Other | Admitting: Obstetrics

## 2013-07-01 VITALS — BP 123/83 | HR 112 | Temp 97.7°F | Ht 60.0 in | Wt 97.0 lb

## 2013-07-01 DIAGNOSIS — N946 Dysmenorrhea, unspecified: Secondary | ICD-10-CM

## 2013-07-01 DIAGNOSIS — Z7251 High risk heterosexual behavior: Secondary | ICD-10-CM

## 2013-07-01 DIAGNOSIS — N949 Unspecified condition associated with female genital organs and menstrual cycle: Secondary | ICD-10-CM

## 2013-07-01 LAB — POCT URINE PREGNANCY: Preg Test, Ur: NEGATIVE

## 2013-07-01 MED ORDER — NORETHINDRONE ACETATE 5 MG PO TABS: 10.0000 mg | ORAL_TABLET | Freq: Every day | ORAL | Status: DC

## 2013-07-01 NOTE — Progress Notes (Signed)
Subjective:     Stephanie Mack is a 23 y.o. female here for a problem visit.  Current complaints: Patient states she is having lower abdomen cramping. Patient states it is a constant sharp pain. Patient states this has been going on for about 1 week.   Personal health questionnaire reviewed: yes.   Gynecologic History No LMP recorded. Contraception: none  Obstetric History OB History  Gravida Para Term Preterm AB SAB TAB Ectopic Multiple Living  2 1 0 1 1 1 0 0 0 0     # Outcome Date GA Lbr Len/2nd Weight Sex Delivery Anes PTL Lv  2 PRE 09/01/12 [redacted]w[redacted]d   F LTCS Spinal  N  1 SAB 09/16/09 [redacted]w[redacted]d            Comments: Twin gestational sacs.        The following portions of the patient's history were reviewed and updated as appropriate: allergies, current medications, past family history, past medical history, past social history, past surgical history and problem list.  Review of Systems Pertinent items are noted in HPI.    Objective:    General appearance: alert and no distress Abdomen: normal findings: soft, non-tender Pelvic: cervix normal in appearance, external genitalia normal, no adnexal masses or tenderness, no cervical motion tenderness, rectovaginal septum normal, uterus normal size, shape, and consistency and vagina normal without discharge    50% of 20 min visit spent on counseling and coordination of care.   Assessment:    Healthy female exam.   AUB   Dysmenorrhea   Plan:    Education reviewed: safe sex/STD prevention and management of dysmenorrhea and AUB. Contraception: none. Follow up in: 3 months. Aygestin Rx for cycle regulation.  Patient does not desire contraception.

## 2013-07-02 LAB — GC/CHLAMYDIA PROBE AMP
CT PROBE, AMP APTIMA: NEGATIVE
GC PROBE AMP APTIMA: NEGATIVE

## 2013-07-02 LAB — WET PREP BY MOLECULAR PROBE
Candida species: NEGATIVE
GARDNERELLA VAGINALIS: POSITIVE — AB
TRICHOMONAS VAG: NEGATIVE

## 2013-07-06 ENCOUNTER — Encounter: Payer: Self-pay | Admitting: Obstetrics

## 2013-07-09 ENCOUNTER — Ambulatory Visit: Payer: Medicaid Other

## 2013-07-09 DIAGNOSIS — Z309 Encounter for contraceptive management, unspecified: Secondary | ICD-10-CM

## 2013-07-11 ENCOUNTER — Other Ambulatory Visit: Payer: Self-pay | Admitting: *Deleted

## 2013-07-11 DIAGNOSIS — N760 Acute vaginitis: Principal | ICD-10-CM

## 2013-07-11 DIAGNOSIS — B9689 Other specified bacterial agents as the cause of diseases classified elsewhere: Secondary | ICD-10-CM

## 2013-07-11 MED ORDER — METRONIDAZOLE 500 MG PO TABS: 500.0000 mg | ORAL_TABLET | Freq: Two times a day (BID) | ORAL | Status: DC

## 2013-07-23 ENCOUNTER — Other Ambulatory Visit (INDEPENDENT_AMBULATORY_CARE_PROVIDER_SITE_OTHER): Payer: Medicaid Other | Admitting: *Deleted

## 2013-07-23 VITALS — BP 131/83 | HR 109 | Temp 97.9°F | Ht 60.0 in

## 2013-07-23 DIAGNOSIS — Z3200 Encounter for pregnancy test, result unknown: Secondary | ICD-10-CM

## 2013-07-23 LAB — POCT URINE PREGNANCY: PREG TEST UR: NEGATIVE

## 2013-07-23 NOTE — Progress Notes (Signed)
Patient is in the office today for a UPT. Patient took a UPT at home yesterday and it came out positive. Patient has not had a cycle since she stopped getting the DEPO Injections in March. UPT preformed, results were negative. Patient was shown the UPT.  Patient advised the only ways to prevent pregnancy is to abstain from sex or to go on birth control. Patient was given the option to speak with Dr. Jodi Mourning today about birth control options. Patient stated she did not want to be on birth control. Patient advised again that those were her only options to prevent pregnancy. Patient advised that there was the nexplanon that goes in her arm and is good for 3 years or that there are IUDs that go in the Uterus that are good for 5 or 10 years or there are birth control pills. Patient advised to think about her options and if she changed her mind to call and make a consult appointment with Dr. Jodi Mourning.

## 2013-07-23 NOTE — Progress Notes (Signed)
BP 131/83  Pulse 109  Temp(Src) 97.9 F (36.6 C)  Ht 5' (1.524 m)

## 2013-07-29 DIAGNOSIS — R000 Tachycardia, unspecified: Secondary | ICD-10-CM | POA: Insufficient documentation

## 2013-07-29 DIAGNOSIS — Z202 Contact with and (suspected) exposure to infections with a predominantly sexual mode of transmission: Secondary | ICD-10-CM | POA: Insufficient documentation

## 2013-07-29 DIAGNOSIS — R1110 Vomiting, unspecified: Secondary | ICD-10-CM | POA: Insufficient documentation

## 2013-07-29 DIAGNOSIS — N72 Inflammatory disease of cervix uteri: Secondary | ICD-10-CM | POA: Insufficient documentation

## 2013-07-29 DIAGNOSIS — F329 Major depressive disorder, single episode, unspecified: Secondary | ICD-10-CM | POA: Insufficient documentation

## 2013-07-29 DIAGNOSIS — F909 Attention-deficit hyperactivity disorder, unspecified type: Secondary | ICD-10-CM | POA: Insufficient documentation

## 2013-07-29 DIAGNOSIS — F449 Dissociative and conversion disorder, unspecified: Secondary | ICD-10-CM | POA: Insufficient documentation

## 2013-07-29 DIAGNOSIS — F319 Bipolar disorder, unspecified: Secondary | ICD-10-CM | POA: Insufficient documentation

## 2013-07-29 DIAGNOSIS — F411 Generalized anxiety disorder: Secondary | ICD-10-CM | POA: Insufficient documentation

## 2013-07-29 DIAGNOSIS — D649 Anemia, unspecified: Secondary | ICD-10-CM | POA: Insufficient documentation

## 2013-07-29 DIAGNOSIS — F3289 Other specified depressive episodes: Secondary | ICD-10-CM | POA: Insufficient documentation

## 2013-07-29 LAB — COMPREHENSIVE METABOLIC PANEL
ALBUMIN: 4.2 g/dL (ref 3.5–5.2)
ALK PHOS: 63 U/L (ref 39–117)
ALT: 11 U/L (ref 0–35)
AST: 17 U/L (ref 0–37)
BUN: 10 mg/dL (ref 6–23)
CHLORIDE: 103 meq/L (ref 96–112)
CO2: 22 mEq/L (ref 19–32)
Calcium: 9.9 mg/dL (ref 8.4–10.5)
Creatinine, Ser: 0.6 mg/dL (ref 0.50–1.10)
GFR calc Af Amer: >90 mL/min (ref >90)
GFR calc non Af Amer: >90 mL/min (ref >90)
Glucose, Bld: 91 mg/dL (ref 70–99)
POTASSIUM: 3.8 meq/L (ref 3.7–5.3)
Sodium: 140 mEq/L (ref 137–147)
TOTAL PROTEIN: 8.3 g/dL (ref 6.0–8.3)
Total Bilirubin: 0.4 mg/dL (ref 0.3–1.2)

## 2013-07-29 LAB — URINALYSIS, ROUTINE W REFLEX MICROSCOPIC
Bilirubin Urine: NEGATIVE
GLUCOSE, UA: NEGATIVE mg/dL
HGB URINE DIPSTICK: NEGATIVE
KETONES UR: NEGATIVE mg/dL
LEUKOCYTES UA: NEGATIVE
Nitrite: NEGATIVE
Protein, ur: 30 mg/dL — AB
Specific Gravity, Urine: 1.023 (ref 1.005–1.030)
Urobilinogen, UA: 0.2 mg/dL (ref 0.0–1.0)
pH: 6 (ref 5.0–8.0)

## 2013-07-29 LAB — CBC WITH DIFFERENTIAL/PLATELET
BASOS ABS: 0 10*3/uL (ref 0.0–0.1)
BASOS PCT: 0 % (ref 0–1)
EOS ABS: 0 10*3/uL (ref 0.0–0.7)
Eosinophils Relative: 1 % (ref 0–5)
HCT: 42 % (ref 36.0–46.0)
HEMOGLOBIN: 14 g/dL (ref 12.0–15.0)
Lymphocytes Relative: 54 % — ABNORMAL HIGH (ref 12–46)
Lymphs Abs: 3.4 10*3/uL (ref 0.7–4.0)
MCH: 26.3 pg (ref 26.0–34.0)
MCHC: 33.3 g/dL (ref 30.0–36.0)
MCV: 78.9 fL (ref 78.0–100.0)
MONOS PCT: 7 % (ref 3–12)
Monocytes Absolute: 0.4 10*3/uL (ref 0.1–1.0)
NEUTROS ABS: 2.4 10*3/uL (ref 1.7–7.7)
NEUTROS PCT: 38 % — AB (ref 43–77)
PLATELETS: 263 10*3/uL (ref 150–400)
RBC: 5.32 MIL/uL — ABNORMAL HIGH (ref 3.87–5.11)
RDW: 13.5 % (ref 11.5–15.5)
WBC: 6.3 10*3/uL (ref 4.0–10.5)

## 2013-07-29 LAB — URINE MICROSCOPIC-ADD ON

## 2013-07-29 LAB — POC URINE PREG, ED: PREG TEST UR: NEGATIVE

## 2013-07-29 NOTE — ED Notes (Signed)
Pt c/o lower abdominal pain since this morning. Pt reports nausea and vomiting x3. Pt denies dysuria, hematuria. LMP three months ago. Pt uses Depo shot for birth control. Pt states she missed second shot March 4th.

## 2013-07-30 ENCOUNTER — Emergency Department (HOSPITAL_COMMUNITY)
Admission: EM | Admit: 2013-07-30 | Discharge: 2013-07-30 | Disposition: A | Discharge status: Home / Self Care | Payer: Medicaid Other | Attending: Emergency Medicine | Admitting: Emergency Medicine

## 2013-07-30 DIAGNOSIS — R102 Pelvic and perineal pain: Secondary | ICD-10-CM

## 2013-07-30 DIAGNOSIS — N72 Inflammatory disease of cervix uteri: Secondary | ICD-10-CM

## 2013-07-30 LAB — WET PREP, GENITAL
Trich, Wet Prep: NONE SEEN
WBC WET PREP: NONE SEEN
Yeast Wet Prep HPF POC: NONE SEEN

## 2013-07-30 LAB — GC/CHLAMYDIA PROBE AMP
CT Probe RNA: NEGATIVE
GC Probe RNA: NEGATIVE

## 2013-07-30 LAB — RAPID HIV SCREEN (WH-MAU): Rapid HIV Screen: NONREACTIVE

## 2013-07-30 LAB — HIV ANTIBODY (ROUTINE TESTING W REFLEX): HIV: NONREACTIVE

## 2013-07-30 LAB — RPR

## 2013-07-30 MED ORDER — STERILE WATER FOR INJECTION IJ SOLN
INTRAMUSCULAR | Status: AC
  Administered 2013-07-30: 2.1 mL
  Filled 2013-07-30: qty 10

## 2013-07-30 MED ORDER — AZITHROMYCIN 250 MG PO TABS
1000.0000 mg | ORAL_TABLET | Freq: Once | ORAL | Status: AC
  Administered 2013-07-30: 1000 mg via ORAL
  Filled 2013-07-30: qty 4

## 2013-07-30 MED ORDER — IBUPROFEN 800 MG PO TABS
800.0000 mg | ORAL_TABLET | ORAL | Status: AC
  Administered 2013-07-30: 800 mg via ORAL
  Filled 2013-07-30: qty 1

## 2013-07-30 MED ORDER — CEFTRIAXONE SODIUM 250 MG IJ SOLR
250.0000 mg | Freq: Once | INTRAMUSCULAR | Status: AC
  Administered 2013-07-30: 250 mg via INTRAMUSCULAR
  Filled 2013-07-30: qty 250

## 2013-07-30 NOTE — Discharge Instructions (Signed)
Followup with women's clinic or an OB/GYN doctor. Return for fevers, uncontrolled pain or worsening symptoms. Take ibuprofen and Tylenol for pain. Safe sex practices.  Results and differential diagnosis were discussed with the patient. Close follow up outpatient was discussed, patient comfortable with the plan.   Filed Vitals:   07/30/13 0054 07/30/13 0130 07/30/13 0145 07/30/13 0200  BP: 141/94 134/90 131/83 159/90  Pulse: 101 83 94 107  Temp: 97.8 F (36.6 C)     TempSrc: Oral     Resp: 16     Height:      Weight:      SpO2: 100% 100% 100% 100%

## 2013-07-30 NOTE — ED Notes (Signed)
Pt reports unprotected sex. Reports missing her depo provera shot in March because she didn't have a ride". Denies dysuria, vaginal odor, or discharge. C/o lower abdominal pain that started this am.

## 2013-08-01 NOTE — ED Provider Notes (Signed)
CSN: [id number]     Arrival date & time 07/29/13  2242 History   First MD Initiated Contact with Patient 07/30/13 0131     Chief Complaint  Patient presents with  . Abdominal Pain     (Consider location/radiation/quality/duration/timing/severity/associated sxs/prior Treatment) HPI Comments: 23 year old female with history of preeclampsia, PID, BV presents with suprapubic pain with mild nausea and vomiting. No sick contacts. This is not currently pregnant. Patient uses the DepoCyt for birth control however she missed one of her shots. Patient is very mild discharge similar previous and no bleeding. Nonradiating suprapubic ache.  Patient is a 23 y.o. female presenting with abdominal pain. The history is provided by the patient.  Abdominal Pain Associated symptoms: nausea and vomiting   Associated symptoms: no chills, no dysuria and no fever     Past Medical History  Diagnosis Date  . Depression     depression  . Bipolar disorder   . ADHD (attention deficit hyperactivity disorder)   . Anxiety   . Anemia   . ADHD (attention deficit hyperactivity disorder)   . Bipolar 1 disorder   . Chlamydia 05-31-10  . Pseudocyesis 2013    Seen in [facility]U for percieved FM, abd distension. Normal exam.   . Gonorrhea contact, treated   . Pregnancy induced hypertension    Past Surgical History  Procedure Laterality Date  . Nasal septum surgery    . Cesarean section N/A 09/01/2012    Procedure:  Primary cesarean section with delivery of baby girl at 34.  Apgars 1/1.  ;  Surgeon: Frederico Hamman, MD;  Location: [facility];  Service: Obstetrics;  Laterality: N/A;   Family History  Problem Relation Age of Onset  . Kidney disease Mother   . Hypertension Father   . Cancer Sister   . Asthma Brother   . Heart disease Neg Hx   . Diabetes Maternal Grandfather    History  Substance Use Topics  . Smoking status: Never Smoker   . Smokeless tobacco: Never Used  . Alcohol Use: No   OB History   Grav  Para Term Preterm Abortions TAB SAB Ect Mult Living   2 1 0 1 1 0 1 0 0 0      Review of Systems  Constitutional: Negative for fever and chills.  Gastrointestinal: Positive for nausea, vomiting and abdominal pain.  Genitourinary: Negative for dysuria.  Neurological: Negative for headaches.      Allergies  Review of patient's allergies indicates no known allergies.  Home Medications   Prior to Admission medications   Medication Sig Start Date End Date Taking? Authorizing Provider  acetaminophen (TYLENOL) 325 MG tablet Take 650 mg by mouth every 6 (six) hours as needed for mild pain.   Yes Historical Provider, MD   BP 119/82  Pulse 93  Temp(Src) 98 F (36.7 C) (Oral)  Resp 20  Ht 5' (1.524 m)  Wt 98 lb (44.453 kg)  BMI 19.14 kg/m2  SpO2 100%  LMP 04/30/2013 Physical Exam  Nursing note and vitals reviewed. Constitutional: She is oriented to person, place, and time. She appears well-developed and well-nourished.  HENT:  Head: Normocephalic and atraumatic.  Mild dry mucous membranes.  Eyes: Conjunctivae are normal. Right eye exhibits no discharge. Left eye exhibits no discharge.  Neck: Normal range of motion. Neck supple. No tracheal deviation present.  Cardiovascular: Regular rhythm.  Tachycardia present.   Pulmonary/Chest: Effort normal.  Abdominal: Soft. She exhibits no distension. There is tenderness. There is no guarding.  Genitourinary:  No adnexal tenderness mild cervical motion tenderness with mild discharge no bleeding.  Musculoskeletal: She exhibits no edema.  Neurological: She is alert and oriented to person, place, and time.  Skin: Skin is warm. No rash noted.  Psychiatric: She has a normal mood and affect.    ED Course  Procedures (including critical care time) Labs Review Labs Reviewed  WET PREP, GENITAL - Abnormal; Notable for the following:    Clue Cells Wet Prep HPF POC FEW (*)    All other components within normal limits  CBC WITH DIFFERENTIAL -  Abnormal; Notable for the following:    RBC 5.32 (*)    Neutrophils Relative % 38 (*)    Lymphocytes Relative 54 (*)    All other components within normal limits  URINALYSIS, ROUTINE W REFLEX MICROSCOPIC - Abnormal; Notable for the following:    APPearance CLOUDY (*)    Protein, ur 30 (*)    All other components within normal limits  URINE MICROSCOPIC-ADD ON - Abnormal; Notable for the following:    Squamous Epithelial / LPF FEW (*)    Bacteria, UA FEW (*)    All other components within normal limits  GC/CHLAMYDIA PROBE AMP  COMPREHENSIVE METABOLIC PANEL  RPR  HIV ANTIBODY (ROUTINE TESTING)  RAPID HIV SCREEN ([facility])  POC URINE PREG, ED    Imaging Review No results found.   EKG Interpretation None      MDM   Final diagnoses:  Cervicitis  Suprapubic abdominal pain   Well-appearing patient with high risk sexual history. Clinically cervicitis. Patient well-appearing with minimal tenderness on exam. Prophylactic STD antibiotics in the ED with HIV screen ordered. Followup closely with women's clinic discussed. Safe sex practices discussed. Results and differential diagnosis were discussed with the patient. Close follow up outpatient was discussed, patient comfortable with the plan.   Filed Vitals:   07/30/13 0130 07/30/13 0145 07/30/13 0200 07/30/13 0313  BP: 134/90 131/83 159/90 119/82  Pulse: 83 94 107 93  Temp:    98 F (36.7 C)  TempSrc:    Oral  Resp:    20  Height:      Weight:      SpO2: 100% 100% 100% 100%       Mariea Clonts, MD 08/01/13 1117

## 2013-08-12 ENCOUNTER — Ambulatory Visit: Payer: Medicaid Other | Admitting: Obstetrics

## 2013-08-13 ENCOUNTER — Emergency Department (HOSPITAL_COMMUNITY)
Admission: EM | Admit: 2013-08-13 | Discharge: 2013-08-13 | Disposition: A | Discharge status: Home / Self Care | Payer: Medicaid Other | Attending: Emergency Medicine | Admitting: Emergency Medicine

## 2013-08-13 ENCOUNTER — Encounter (HOSPITAL_COMMUNITY): Payer: Self-pay | Admitting: Emergency Medicine

## 2013-08-13 DIAGNOSIS — R630 Anorexia: Secondary | ICD-10-CM | POA: Insufficient documentation

## 2013-08-13 DIAGNOSIS — Z862 Personal history of diseases of the blood and blood-forming organs and certain disorders involving the immune mechanism: Secondary | ICD-10-CM | POA: Insufficient documentation

## 2013-08-13 DIAGNOSIS — Z8619 Personal history of other infectious and parasitic diseases: Secondary | ICD-10-CM | POA: Insufficient documentation

## 2013-08-13 DIAGNOSIS — Z3202 Encounter for pregnancy test, result negative: Secondary | ICD-10-CM | POA: Insufficient documentation

## 2013-08-13 DIAGNOSIS — F329 Major depressive disorder, single episode, unspecified: Secondary | ICD-10-CM

## 2013-08-13 DIAGNOSIS — F32A Depression, unspecified: Secondary | ICD-10-CM

## 2013-08-13 DIAGNOSIS — F4321 Adjustment disorder with depressed mood: Secondary | ICD-10-CM | POA: Diagnosis present

## 2013-08-13 DIAGNOSIS — R6889 Other general symptoms and signs: Secondary | ICD-10-CM | POA: Insufficient documentation

## 2013-08-13 DIAGNOSIS — G4700 Insomnia, unspecified: Secondary | ICD-10-CM | POA: Insufficient documentation

## 2013-08-13 DIAGNOSIS — F319 Bipolar disorder, unspecified: Secondary | ICD-10-CM | POA: Insufficient documentation

## 2013-08-13 LAB — COMPREHENSIVE METABOLIC PANEL
ALK PHOS: 67 U/L (ref 39–117)
ALT: 10 U/L (ref 0–35)
AST: 15 U/L (ref 0–37)
Albumin: 4 g/dL (ref 3.5–5.2)
BUN: 13 mg/dL (ref 6–23)
CALCIUM: 9.6 mg/dL (ref 8.4–10.5)
CO2: 23 meq/L (ref 19–32)
Chloride: 103 mEq/L (ref 96–112)
Creatinine, Ser: 0.6 mg/dL (ref 0.50–1.10)
Glucose, Bld: 106 mg/dL — ABNORMAL HIGH (ref 70–99)
POTASSIUM: 4 meq/L (ref 3.7–5.3)
SODIUM: 139 meq/L (ref 137–147)
Total Bilirubin: 0.3 mg/dL (ref 0.3–1.2)
Total Protein: 7.9 g/dL (ref 6.0–8.3)

## 2013-08-13 LAB — CBC
HCT: 40.1 % (ref 36.0–46.0)
Hemoglobin: 12.9 g/dL (ref 12.0–15.0)
MCH: 25.6 pg — ABNORMAL LOW (ref 26.0–34.0)
MCHC: 32.2 g/dL (ref 30.0–36.0)
MCV: 79.7 fL (ref 78.0–100.0)
Platelets: 227 10*3/uL (ref 150–400)
RBC: 5.03 MIL/uL (ref 3.87–5.11)
RDW: 13.3 % (ref 11.5–15.5)
WBC: 5.3 10*3/uL (ref 4.0–10.5)

## 2013-08-13 LAB — POC URINE PREG, ED: PREG TEST UR: NEGATIVE

## 2013-08-13 LAB — RAPID URINE DRUG SCREEN, HOSP PERFORMED
Amphetamines: NOT DETECTED
BARBITURATES: NOT DETECTED
Benzodiazepines: NOT DETECTED
COCAINE: NOT DETECTED
Opiates: NOT DETECTED
TETRAHYDROCANNABINOL: NOT DETECTED

## 2013-08-13 LAB — ETHANOL: Alcohol, Ethyl (B): <11 mg/dL (ref 0–11)

## 2013-08-13 MED ORDER — FLUOXETINE HCL 20 MG PO CAPS: 20.0000 mg | ORAL_CAPSULE | Freq: Every day | ORAL | Status: DC

## 2013-08-13 MED ORDER — ACETAMINOPHEN 325 MG PO TABS: 650.0000 mg | ORAL_TABLET | ORAL | Status: DC | PRN

## 2013-08-13 MED ORDER — FLUOXETINE HCL 20 MG PO CAPS
20.0000 mg | ORAL_CAPSULE | Freq: Every day | ORAL | Status: DC
  Filled 2013-08-13: qty 1

## 2013-08-13 NOTE — Consult Note (Signed)
[facility] Face-to-Face Psychiatry Consult   Reason for Consult:  Depression Referring Physician:  EDP  Stephanie Mack is an 23 y.o. female. Total Time spent with patient: 45 minutes  Assessment: AXIS I:  Adjustment Disorder with Depressed Mood AXIS II:  Deferred AXIS III:   Past Medical History  Diagnosis Date  . Depression     depression  . Bipolar disorder   . ADHD (attention deficit hyperactivity disorder)   . Anxiety   . Anemia   . ADHD (attention deficit hyperactivity disorder)   . Bipolar 1 disorder   . Chlamydia 05-31-10  . Pseudocyesis 2013    Seen in [facility]U for percieved FM, abd distension. Normal exam.   . Gonorrhea contact, treated   . Pregnancy induced hypertension    AXIS IV:  other psychosocial or environmental problems AXIS V:  61-70 mild symptoms  Plan:  No evidence of imminent risk to self or others at present.   Patient does not meet criteria for psychiatric inpatient admission. Supportive therapy provided about ongoing stressors. Discussed crisis plan, support from social network, calling 911, coming to the Emergency Department, and calling Suicide Hotline.  Subjective:   Stephanie Mack is a 23 y.o. female patient.  HPI:  Patient states "I was just down in my feelings a little bit; I lost my daughter and the birthday would be this month.  I needed someone to talk to.  I was just crying and feeling sad that's all.  My grand mother told me I needed get some help; so I came here to see if I could get some one I could go see.  No I don't want to kill myself; I just need someone to talk to.   Patient denies suicidal/homicidal ideation, psychosis, and paranoia.   Patient states that she has had outpatient services before and was taking Prozac which helped.  Patient states that she lives with her grandmother, mother and siblings whom are all very supportive.    HPI Elements:   Location:  Depressed mood. Quality:  crying. Severity:  loss of child related to pre  eclampsia; depressed mood during what would have been the birth month. Timing:  several days. Review of Systems  Constitutional: Negative for diaphoresis.  Gastrointestinal: Negative for nausea, vomiting, abdominal pain, diarrhea and constipation.  Musculoskeletal: Negative.   Neurological: Negative for dizziness, tremors, seizures, loss of consciousness and headaches.  Psychiatric/Behavioral: Positive for depression. Negative for suicidal ideas, hallucinations, memory loss and substance abuse. The patient is not nervous/anxious and does not have insomnia.     Denies family history of mental illness Past Psychiatric History: Past Medical History  Diagnosis Date  . Depression     depression  . Bipolar disorder   . ADHD (attention deficit hyperactivity disorder)   . Anxiety   . Anemia   . ADHD (attention deficit hyperactivity disorder)   . Bipolar 1 disorder   . Chlamydia 05-31-10  . Pseudocyesis 2013    Seen in [facility]U for percieved FM, abd distension. Normal exam.   . Gonorrhea contact, treated   . Pregnancy induced hypertension     reports that she has never smoked. She has never used smokeless tobacco. She reports that she does not drink alcohol or use illicit drugs. Family History  Problem Relation Age of Onset  . Kidney disease Mother   . Hypertension Father   . Cancer Sister   . Asthma Brother   . Heart disease Neg Hx   . Diabetes Maternal Grandfather  Allergies:  No Known Allergies  ACT Assessment Complete:  Yes:    Educational Status    Risk to Self: Risk to self Is patient at risk for suicide?: No Substance abuse history and/or treatment for substance abuse?: No  Risk to Others:    Abuse:    Prior Inpatient Therapy:    Prior Outpatient Therapy:    Additional Information:     Objective: Blood pressure 126/94, pulse 88, temperature 98.5 F (36.9 C), temperature source Oral, resp. rate 18, last menstrual period 04/30/2013, SpO2 99.00%.There is no  weight on file to calculate BMI. Results for orders placed during the hospital encounter of 08/13/13 (from the past 72 hour(s))  CBC     Status: Abnormal   Collection Time    08/13/13  4:52 AM      Result Value Ref Range   WBC 5.3  4.0 - 10.5 K/uL   RBC 5.03  3.87 - 5.11 MIL/uL   Hemoglobin 12.9  12.0 - 15.0 g/dL   HCT 40.1  36.0 - 46.0 %   MCV 79.7  78.0 - 100.0 fL   MCH 25.6 (*) 26.0 - 34.0 pg   MCHC 32.2  30.0 - 36.0 g/dL   RDW 13.3  11.5 - 15.5 %   Platelets 227  150 - 400 K/uL  COMPREHENSIVE METABOLIC PANEL     Status: Abnormal   Collection Time    08/13/13  4:52 AM      Result Value Ref Range   Sodium 139  137 - 147 mEq/L   Potassium 4.0  3.7 - 5.3 mEq/L   Chloride 103  96 - 112 mEq/L   CO2 23  19 - 32 mEq/L   Glucose, Bld 106 (*) 70 - 99 mg/dL   BUN 13  6 - 23 mg/dL   Creatinine, Ser 0.60  0.50 - 1.10 mg/dL   Calcium 9.6  8.4 - 10.5 mg/dL   Total Protein 7.9  6.0 - 8.3 g/dL   Albumin 4.0  3.5 - 5.2 g/dL   AST 15  0 - 37 U/L   ALT 10  0 - 35 U/L   Alkaline Phosphatase 67  39 - 117 U/L   Total Bilirubin 0.3  0.3 - 1.2 mg/dL   GFR calc non Af Amer >90  >90 mL/min   GFR calc Af Amer >90  >90 mL/min   Comment: (NOTE)     The eGFR has been calculated using the CKD EPI equation.     This calculation has not been validated in all clinical situations.     eGFR's persistently <90 mL/min signify possible Chronic Kidney     Disease.  ETHANOL     Status: None   Collection Time    08/13/13  4:52 AM      Result Value Ref Range   Alcohol, Ethyl (B) <11  0 - 11 mg/dL   Comment:            LOWEST DETECTABLE LIMIT FOR     SERUM ALCOHOL IS 11 mg/dL     FOR MEDICAL PURPOSES ONLY  URINE RAPID DRUG SCREEN (HOSP PERFORMED)     Status: None   Collection Time    08/13/13  4:55 AM      Result Value Ref Range   Opiates NONE DETECTED  NONE DETECTED   Cocaine NONE DETECTED  NONE DETECTED   Benzodiazepines NONE DETECTED  NONE DETECTED   Amphetamines NONE DETECTED  NONE DETECTED    Tetrahydrocannabinol  NONE DETECTED  NONE DETECTED   Barbiturates NONE DETECTED  NONE DETECTED   Comment:            DRUG SCREEN FOR MEDICAL PURPOSES     ONLY.  IF CONFIRMATION IS NEEDED     FOR ANY PURPOSE, NOTIFY LAB     WITHIN 5 DAYS.                LOWEST DETECTABLE LIMITS     FOR URINE DRUG SCREEN     Drug Class       Cutoff (ng/mL)     Amphetamine      1000     Barbiturate      200     Benzodiazepine   478     Tricyclics       295     Opiates          300     Cocaine          300     THC              50  POC URINE PREG, ED     Status: None   Collection Time    08/13/13  5:02 AM      Result Value Ref Range   Preg Test, Ur NEGATIVE  NEGATIVE   Comment:            THE SENSITIVITY OF THIS     METHODOLOGY IS >24 mIU/mL   Labs are reviewed and no critical values noted.  Medications reviewed.  Will start patient on Prozac 20 mg daily.  Current Facility-Administered Medications  Medication Dose Route Frequency Provider Last Rate Last Dose  . acetaminophen (TYLENOL) tablet 650 mg  650 mg Oral Q4H PRN Blanchie Dessert, MD      . medroxyPROGESTERone (DEPO-PROVERA) injection 150 mg  150 mg Intramuscular Q90 days Shelly Bombard, MD   150 mg at 03/14/13 1033   Current Outpatient Prescriptions  Medication Sig Dispense Refill  . acetaminophen (TYLENOL) 325 MG tablet Take 325 mg by mouth every 6 (six) hours as needed for mild pain.       . Prenatal Vit-Fe Fumarate-FA (PRENATAL MULTIVITAMIN) TABS tablet Take 1 tablet by mouth daily at 12 noon.        Psychiatric Specialty Exam:     Blood pressure 126/94, pulse 88, temperature 98.5 F (36.9 C), temperature source Oral, resp. rate 18, last menstrual period 04/30/2013, SpO2 99.00%.There is no weight on file to calculate BMI.  General Appearance: Casual  Eye Contact::  Good  Speech:  Clear and Coherent and Normal Rate  Volume:  Normal  Mood:  Depressed  Affect:  Congruent and Depressed  Thought Process:  Circumstantial, Coherent,  Goal Directed and Logical  Orientation:  Full (Time, Place, and Person)  Thought Content:  WDL  Suicidal Thoughts:  No  Homicidal Thoughts:  No  Memory:  Immediate;   Good Recent;   Good Remote;   Good  Judgement:  Intact  Insight:  Present  Psychomotor Activity:  Normal  Concentration:  Good  Recall:  Good  Fund of Knowledge:Good  Language: Good  Akathisia:  No  Handed:  Right  AIMS (if indicated):     Assets:  Communication Skills Desire for Improvement Housing Social Support  Sleep:      Musculoskeletal: Strength & Muscle Tone: within normal limits Gait & Station: normal Patient leans: N/A  Treatment Plan Summary: Set up with outpatient services  Disposition:  Discharge home.  SW to set up appointment for outpatient services medication management and therapy.   Discharge home with Prozac 20 mg daily 1 refill.   Discharge Assessment     Demographic Factors:  Black female  Total Time spent with patient: 15 minutes  Psychiatric Specialty Exam:   Same as above  Musculoskeletal: Same as above  Mental Status Per Nursing Assessment::   On Admission:     Current Mental Status by Physician: Patient denies suicidal/homicidal ideation, psychosis, and paranoia  Loss Factors: Loss of new born; 1 year ago  Historical Factors: NA  Risk Reduction Factors:   Sense of responsibility to family, Living with another person, especially a relative and Positive social support  Continued Clinical Symptoms:  Depressed mood  Cognitive Features That Contribute To Risk:  None noted    Suicide Risk:  Minimal: No identifiable suicidal ideation.  Patients presenting with no risk factors but with morbid ruminations; may be classified as minimal risk based on the severity of the depressive symptoms  Discharge Diagnoses:  Same as above  Plan Of Care/Follow-up recommendations:  Activity:  Resume usual activity Diet:  Resume usual diet  Is patient on multiple  antipsychotic therapies at discharge:  No   Has Patient had three or more failed trials of antipsychotic monotherapy by history:  No  Recommended Plan for Multiple Antipsychotic Therapies: NA Nikita Humble, FNP-BC 08/13/2013 11:16 AM

## 2013-08-13 NOTE — ED Notes (Signed)
Pt waiting on evaluation from MD, in no acute distress

## 2013-08-13 NOTE — ED Provider Notes (Addendum)
CSN: [id number]     Arrival date & time 08/13/13  0436 History   First MD Initiated Contact with Patient 08/13/13 [phone]     Chief Complaint  Patient presents with  . Medical Clearance     (Consider location/radiation/quality/duration/timing/severity/associated sxs/prior Treatment) Patient is a 23 y.o. female presenting with mental health disorder. The history is provided by the patient.  Mental Health Problem Presenting symptoms: depression   Presenting symptoms: no suicidal thoughts, no suicidal threats and no suicide attempt   Degree of incapacity (severity):  Moderate Onset quality:  Gradual Timing:  Constant Progression:  Worsening Chronicity:  Recurrent Context: stressful life event   Context comment:  Coming up on the 1 year anniversary of her daughters death Treatment compliance:  All of the time Time since last psychoactive medication taken:  1 day Relieved by:  Antidepressants Worsened by:  Nothing tried Ineffective treatments:  Antidepressants Associated symptoms: anhedonia, appetite change, feelings of worthlessness and insomnia   Risk factors: hx of mental illness   Risk factors: no hx of suicide attempts     Past Medical History  Diagnosis Date  . Depression     depression  . Bipolar disorder   . ADHD (attention deficit hyperactivity disorder)   . Anxiety   . Anemia   . ADHD (attention deficit hyperactivity disorder)   . Bipolar 1 disorder   . Chlamydia 05-31-10  . Pseudocyesis 2013    Seen in MAU for percieved FM, abd distension. Normal exam.   . Gonorrhea contact, treated   . Pregnancy induced hypertension    Past Surgical History  Procedure Laterality Date  . Nasal septum surgery    . Cesarean section N/A 09/01/2012    Procedure:  Primary cesarean section with delivery of baby girl at 10.  Apgars 1/1.  ;  Surgeon: Frederico Hamman, MD;  Location: [facility];  Service: Obstetrics;  Laterality: N/A;   Family History  Problem Relation Age of Onset  .  Kidney disease Mother   . Hypertension Father   . Cancer Sister   . Asthma Brother   . Heart disease Neg Hx   . Diabetes Maternal Grandfather    History  Substance Use Topics  . Smoking status: Never Smoker   . Smokeless tobacco: Never Used  . Alcohol Use: No   OB History   Grav Para Term Preterm Abortions TAB SAB Ect Mult Living   2 1 0 1 1 0 1 0 0 0      Review of Systems  Constitutional: Positive for appetite change.  Psychiatric/Behavioral: Negative for suicidal ideas. The patient has insomnia.   All other systems reviewed and are negative.     Allergies  Review of patient's allergies indicates no known allergies.  Home Medications   Prior to Admission medications   Medication Sig Start Date End Date Taking? Authorizing Provider  acetaminophen (TYLENOL) 325 MG tablet Take 650 mg by mouth every 6 (six) hours as needed for mild pain.    Historical Provider, MD   BP 126/94  Pulse 88  Temp(Src) 98.5 F (36.9 C) (Oral)  Resp 18  SpO2 99%  LMP 04/30/2013 Physical Exam  Nursing note and vitals reviewed. Constitutional: She is oriented to person, place, and time. She appears well-developed and well-nourished. No distress.  HENT:  Head: Normocephalic and atraumatic.  Mouth/Throat: Oropharynx is clear and moist.  Eyes: Conjunctivae and EOM are normal. Pupils are equal, round, and reactive to light.  Neck: Normal range of motion. Neck  supple.  Cardiovascular: Normal rate, regular rhythm and intact distal pulses.   No murmur heard. Pulmonary/Chest: Effort normal and breath sounds normal. No respiratory distress. She has no wheezes. She has no rales.  Abdominal: Soft. She exhibits no distension. There is no tenderness. There is no rebound and no guarding.  Musculoskeletal: Normal range of motion. She exhibits no edema and no tenderness.  Neurological: She is alert and oriented to person, place, and time.  Skin: Skin is warm and dry. No rash noted. No erythema.   Psychiatric: Her behavior is normal. Her affect is blunt. She is not actively hallucinating. She exhibits a depressed mood. She expresses no homicidal and no suicidal ideation.    ED Course  Procedures (including critical care time) Labs Review Labs Reviewed  CBC - Abnormal; Notable for the following:    MCH 25.6 (*)    All other components within normal limits  COMPREHENSIVE METABOLIC PANEL - Abnormal; Notable for the following:    Glucose, Bld 106 (*)    All other components within normal limits  ETHANOL  URINE RAPID DRUG SCREEN (HOSP PERFORMED)  POC URINE PREG, ED    Imaging Review No results found.   EKG Interpretation None      MDM   Final diagnoses:  Depressed   Patient here complaining of worsening depression and 7 next week marks for one year in reverse reviewed the death of her daughter. She is still taking his antidepressant medication but states that she has been more sad, sleeping last, eating less and feels that she is getting worse. She has not followed up with outpatient psychiatry. She denies any suicidal or homicidal ideation and is just asking to talk with someone. Alcohol use. Labs are within normal limits. Vital signs are normal. Patient moved to the back for psychiatric evaluation. She is medically clear    Blanchie Dessert, MD 08/13/13 [place], MD 08/13/13 [phone]

## 2013-08-13 NOTE — ED Notes (Signed)
Pt has in belonging bag:  White cell phone, multi colored key holder w/one key, gold plated necklace, silver plated ring w/ a green stone, a silver plated school ring, green sandals, pink underwear, black shorts, sponge bob night shirt, light blue hoodie, blue bra,

## 2013-08-13 NOTE — Discharge Instructions (Signed)
Adjustment Disorder Most changes in life can cause stress. Getting used to changes may take a few months or longer. If feelings of stress, hopelessness, or worry continue, you may have an adjustment disorder. This stress-related mental health problem may affect your feelings, thinking and how you act. It occurs in both sexes and happens at any age. SYMPTOMS  Some of the following problems may be seen and vary from person to person:  Sadness or depression.  Loss of enjoyment.  Thoughts of suicide.  Fighting.  Avoiding family and friends.  Poor school performance.  Hopelessness, sense of loss.  Trouble sleeping.  Vandalism.  Worry, weight loss or gain.  Crying spells.  Anxiety  Reckless driving.  Skipping school.  Poor work Systems analyst.  Nervousness.  Ignoring bills.  Poor attitude. DIAGNOSIS  Your caregiver will ask what has happened in your life and do a physical exam. They will make a diagnosis of an adjustment disorder when they are sure another problem or medical illness causing your feelings does not exist. TREATMENT  When problems caused by stress interfere with you daily life or last longer than a few months, you may need counseling for an adjustment disorder. Early treatment may diminish problems and help you to better cope with the stressful events in your life. Sometimes medication is necessary. Individual counseling and or support groups can be very helpful. PROGNOSIS  Adjustment disorders usually last less than 3 to 6 months. The condition may persist if there is long lasting stress. This could include health problems, relationship problems, or job difficulties where you can not easily escape from what is causing the problem. PREVENTION  Even the most mentally healthy, highly functioning people can suffer from an adjustment disorder given a significant blow from a life-changing event. There is no way to prevent pain and loss. Most people need help from time  to time. You are not alone. SEEK MEDICAL CARE IF:  Your feelings or symptoms listed above do not improve or worsen. Document Released: 11/23/2005 Document Revised: 06/13/2011 Document Reviewed: 02/14/2007 [facility] Patient Information 2014 [place], [place].  Depression, Adult Depression is feeling sad, low, down in the dumps, blue, gloomy, or empty. In general, there are two kinds of depression:  Normal sadness or grief. This can happen after something upsetting. It often goes away on its own within 2 weeks. After losing a loved one (bereavement), normal sadness and grief may last longer than two weeks. It usually gets better with time.  Clinical depression. This kind lasts longer than normal sadness or grief. It keeps you from doing the things you normally do in life. It is often hard to function at home, work, or at school. It may affect your relationships with others. Treatment is often needed. GET HELP RIGHT AWAY IF:  You have thoughts about hurting yourself or others.  You lose touch with reality (psychotic symptoms). You may:  See or hear things that are not real.  Have untrue beliefs about your life or people around you.  Your medicine is giving you problems. MAKE SURE YOU:  Understand these instructions.  Will watch your condition.  Will get help right away if you are not doing well or get worse. Document Released: 04/23/2010 Document Revised: 12/14/2011 Document Reviewed: 07/21/2011 [facility] Patient Information 2014 [place], [place].

## 2013-08-13 NOTE — BH Assessment (Signed)
Discharge per Dr. Lovena Le with a follow up outpatient appointment.   [facility] (Medication management and therapy) [address], [place] [address] [phone]  Your Appointment is scheduled for Wednesday Aug 13, 2013@ 10am (Intake Only)

## 2013-08-13 NOTE — ED Notes (Signed)
Pt here for medical clearance, she states that she's very depressed, last year she had a daughter that died within the first few hours of life, since then she has been unable to cope. Pt denies suicidal thoughts at this time

## 2013-08-13 NOTE — ED Notes (Signed)
The [employer] report says that she was going to kill herself, has a hx of attempted hanging

## 2013-08-14 NOTE — Consult Note (Signed)
Face to face evaluation and I agree with this note 

## 2013-08-28 ENCOUNTER — Ambulatory Visit: Payer: Medicaid Other | Admitting: Obstetrics

## 2013-08-30 ENCOUNTER — Encounter (HOSPITAL_COMMUNITY): Payer: Self-pay | Admitting: Emergency Medicine

## 2013-08-30 ENCOUNTER — Emergency Department (HOSPITAL_COMMUNITY)
Admission: EM | Admit: 2013-08-30 | Discharge: 2013-08-30 | Discharge status: Against Medical Advice | Payer: Medicaid Other | Attending: Emergency Medicine | Admitting: Emergency Medicine

## 2013-08-30 DIAGNOSIS — N898 Other specified noninflammatory disorders of vagina: Secondary | ICD-10-CM | POA: Insufficient documentation

## 2013-08-30 LAB — URINALYSIS, ROUTINE W REFLEX MICROSCOPIC
Bilirubin Urine: NEGATIVE
Glucose, UA: NEGATIVE mg/dL
KETONES UR: NEGATIVE mg/dL
Leukocytes, UA: NEGATIVE
Nitrite: NEGATIVE
PROTEIN: 100 mg/dL — AB
Specific Gravity, Urine: 1.034 — ABNORMAL HIGH (ref 1.005–1.030)
Urobilinogen, UA: 0.2 mg/dL (ref 0.0–1.0)
pH: 5.5 (ref 5.0–8.0)

## 2013-08-30 LAB — POC URINE PREG, ED: Preg Test, Ur: NEGATIVE

## 2013-08-30 LAB — URINE MICROSCOPIC-ADD ON

## 2013-08-30 NOTE — ED Notes (Signed)
Patient called x3, no response, appears to have lwbs after triage.

## 2013-08-30 NOTE — ED Notes (Signed)
Presents with vaginal bleeding that began today, bleeding is light- pt has not had a period, she takes depo but has not had her period since she had depo shot. She is wearing a pantyliner and that is containing the vaginal spotting. Reports seeing a clot in toilet.

## 2013-08-30 NOTE — ED Notes (Signed)
Pt called x1 for treatment room, no response, will call again in 5 minutes.

## 2013-08-30 NOTE — ED Notes (Signed)
Patient called x2, no response, will call again in 5 minutes.

## 2013-09-02 ENCOUNTER — Other Ambulatory Visit: Payer: Medicaid Other

## 2013-09-05 ENCOUNTER — Inpatient Hospital Stay (HOSPITAL_COMMUNITY)
Admission: AD | Admit: 2013-09-05 | Discharge: 2013-09-05 | Disposition: A | Discharge status: Home / Self Care | Payer: Medicaid Other | Source: Ambulatory Visit | Attending: Obstetrics & Gynecology | Admitting: Obstetrics & Gynecology

## 2013-09-05 ENCOUNTER — Inpatient Hospital Stay (HOSPITAL_COMMUNITY): Payer: Medicaid Other

## 2013-09-05 ENCOUNTER — Encounter (HOSPITAL_COMMUNITY): Payer: Self-pay | Admitting: *Deleted

## 2013-09-05 DIAGNOSIS — R109 Unspecified abdominal pain: Secondary | ICD-10-CM

## 2013-09-05 DIAGNOSIS — N939 Abnormal uterine and vaginal bleeding, unspecified: Secondary | ICD-10-CM

## 2013-09-05 DIAGNOSIS — Z3042 Encounter for surveillance of injectable contraceptive: Secondary | ICD-10-CM

## 2013-09-05 DIAGNOSIS — N898 Other specified noninflammatory disorders of vagina: Secondary | ICD-10-CM

## 2013-09-05 DIAGNOSIS — F329 Major depressive disorder, single episode, unspecified: Secondary | ICD-10-CM | POA: Insufficient documentation

## 2013-09-05 DIAGNOSIS — N949 Unspecified condition associated with female genital organs and menstrual cycle: Secondary | ICD-10-CM | POA: Insufficient documentation

## 2013-09-05 DIAGNOSIS — F3289 Other specified depressive episodes: Secondary | ICD-10-CM | POA: Insufficient documentation

## 2013-09-05 DIAGNOSIS — F909 Attention-deficit hyperactivity disorder, unspecified type: Secondary | ICD-10-CM | POA: Insufficient documentation

## 2013-09-05 LAB — URINALYSIS, ROUTINE W REFLEX MICROSCOPIC
Bilirubin Urine: NEGATIVE
Glucose, UA: NEGATIVE mg/dL
HGB URINE DIPSTICK: NEGATIVE
Ketones, ur: NEGATIVE mg/dL
Leukocytes, UA: NEGATIVE
NITRITE: NEGATIVE
Protein, ur: NEGATIVE mg/dL
Specific Gravity, Urine: >1.03 — ABNORMAL HIGH (ref 1.005–1.030)
UROBILINOGEN UA: 0.2 mg/dL (ref 0.0–1.0)
pH: 6 (ref 5.0–8.0)

## 2013-09-05 LAB — CBC
HCT: 39.4 % (ref 36.0–46.0)
Hemoglobin: 12.8 g/dL (ref 12.0–15.0)
MCH: 26.2 pg (ref 26.0–34.0)
MCHC: 32.5 g/dL (ref 30.0–36.0)
MCV: 80.7 fL (ref 78.0–100.0)
PLATELETS: 193 10*3/uL (ref 150–400)
RBC: 4.88 MIL/uL (ref 3.87–5.11)
RDW: 13.2 % (ref 11.5–15.5)
WBC: 5.7 10*3/uL (ref 4.0–10.5)

## 2013-09-05 LAB — COMPREHENSIVE METABOLIC PANEL
ALBUMIN: 3.8 g/dL (ref 3.5–5.2)
ALT: 9 U/L (ref 0–35)
AST: 14 U/L (ref 0–37)
Alkaline Phosphatase: 59 U/L (ref 39–117)
BILIRUBIN TOTAL: 0.4 mg/dL (ref 0.3–1.2)
BUN: 11 mg/dL (ref 6–23)
CALCIUM: 9.6 mg/dL (ref 8.4–10.5)
CHLORIDE: 105 meq/L (ref 96–112)
CO2: 25 mEq/L (ref 19–32)
Creatinine, Ser: 0.62 mg/dL (ref 0.50–1.10)
GFR calc Af Amer: >90 mL/min (ref >90)
GFR calc non Af Amer: >90 mL/min (ref >90)
Glucose, Bld: 92 mg/dL (ref 70–99)
Potassium: 4 mEq/L (ref 3.7–5.3)
SODIUM: 141 meq/L (ref 137–147)
Total Protein: 7 g/dL (ref 6.0–8.3)

## 2013-09-05 LAB — WET PREP, GENITAL
Trich, Wet Prep: NONE SEEN
YEAST WET PREP: NONE SEEN

## 2013-09-05 LAB — POCT PREGNANCY, URINE: Preg Test, Ur: NEGATIVE

## 2013-09-05 LAB — HCG, SERUM, QUALITATIVE: Preg, Serum: NEGATIVE

## 2013-09-05 MED ORDER — FLUCONAZOLE 150 MG PO TABS
150.0000 mg | ORAL_TABLET | Freq: Once | ORAL | Status: AC
  Administered 2013-09-05: 150 mg via ORAL
  Filled 2013-09-05: qty 1

## 2013-09-05 MED ORDER — IBUPROFEN 600 MG PO TABS: 600.0000 mg | ORAL_TABLET | Freq: Four times a day (QID) | ORAL | Status: DC | PRN

## 2013-09-05 MED ORDER — MEDROXYPROGESTERONE ACETATE 150 MG/ML IM SUSP
150.0000 mg | Freq: Once | INTRAMUSCULAR | Status: AC
  Administered 2013-09-05: 150 mg via INTRAMUSCULAR
  Filled 2013-09-05: qty 1

## 2013-09-05 MED ORDER — KETOROLAC TROMETHAMINE 10 MG PO TABS
10.0000 mg | ORAL_TABLET | Freq: Once | ORAL | Status: AC
  Administered 2013-09-05: 10 mg via ORAL
  Filled 2013-09-05: qty 1

## 2013-09-05 NOTE — MAU Provider Note (Signed)
History     CSN: [id number]  Arrival date and time: 09/05/13 1301   First Provider Initiated Contact with Patient 09/05/13 1359      Chief Complaint  Patient presents with  . Vaginal Bleeding  . Abdominal Pain   HPI  Pt is not pregnant and presents with abd pain and bleeding since mid May- pt had Depo with missed dose March 4 and has been bleeding since. Pt has hx of dysmenorrhea.  Pt has pain all day long since May.  Pt has taken Tylenol without relief.  Pt also took Ibuprofen without relief. Pt denies abnormal vaginal discharge, pain With urination, constipation or diarrhea.  Pt threw up one time today.  Pt had pizza last night and threw that up.  Pt has been nauseated and vomiting with breast soreness since She got off Depo in March.  Pt has not been in counseling or medication in several years, but has previously been diagnosed with bipolar and depression  And ADHD.  Pt denies being depressed at this time.  Pt is sexually active with one partner and has pain with IC since she started bleeding.  Pt has light bleeding until she stands up then Has gushes of blood.  Pt has not been able to get a urine specimen- pt has a cut on her vagina and it hurts when she pees. Pt has an appointment to see Dr. Jodi Mourning on June 11- pt did not call office Pt wants to restart depo  Past Medical History  Diagnosis Date  . Depression     depression  . Bipolar disorder   . ADHD (attention deficit hyperactivity disorder)   . Anxiety   . Anemia   . ADHD (attention deficit hyperactivity disorder)   . Bipolar 1 disorder   . Chlamydia 05-31-10  . Pseudocyesis 2013    Seen in MAU for percieved FM, abd distension. Normal exam.   . Gonorrhea contact, treated   . Pregnancy induced hypertension     Past Surgical History  Procedure Laterality Date  . Nasal septum surgery    . Cesarean section N/A 09/01/2012    Procedure:  Primary cesarean section with delivery of baby girl at 61.  Apgars 1/1.  ;   Surgeon: Frederico Hamman, MD;  Location: [facility];  Service: Obstetrics;  Laterality: N/A;    Family History  Problem Relation Age of Onset  . Kidney disease Mother   . Hypertension Father   . Cancer Sister   . Asthma Brother   . Heart disease Neg Hx   . Diabetes Maternal Grandfather     History  Substance Use Topics  . Smoking status: Never Smoker   . Smokeless tobacco: Never Used  . Alcohol Use: No    Allergies: No Known Allergies  Facility-administered medications prior to admission  Medication Dose Route Frequency Provider Last Rate Last Dose  . medroxyPROGESTERone (DEPO-PROVERA) injection 150 mg  150 mg Intramuscular Q90 days Shelly Bombard, MD   150 mg at 03/14/13 1033   Prescriptions prior to admission  Medication Sig Dispense Refill  . Prenatal Vit-Fe Fumarate-FA (PRENATAL MULTIVITAMIN) TABS tablet Take 1 tablet by mouth daily at 12 noon.        Review of Systems  Constitutional: Positive for weight loss and malaise/fatigue. Negative for fever and chills.  HENT:       Pt has headaches every day.  Eyes: Negative for blurred vision.  Respiratory: Positive for cough. Negative for sputum production.   Gastrointestinal:  Positive for heartburn, nausea, vomiting and abdominal pain. Negative for diarrhea and constipation.  Genitourinary: Negative for dysuria.  Musculoskeletal: Positive for back pain.  Neurological: Positive for dizziness, weakness and headaches.  Psychiatric/Behavioral: Negative for suicidal ideas and substance abuse. The patient is nervous/anxious and has insomnia.    Physical Exam   Blood pressure 125/76, pulse 78, temperature 98.8 F (37.1 C), temperature source Oral, resp. rate 16, height 5\' 2"  (1.575 m), weight 95 lb (43.092 kg), SpO2 99.00%.  Physical Exam  Nursing note and vitals reviewed. Constitutional: She appears well-developed and well-nourished. No distress.  HENT:  Head: Normocephalic.  Eyes: Pupils are equal, round, and reactive  to light.  Neck: Normal range of motion. Neck supple.  Respiratory: Effort normal.  GI: Soft.  Genitourinary:  Left labial minora raised and reddened irritated area at 1 oclock- tender to touch; vagina- small amount of dark red blood in vault; cervix clean, NT- uterus left sided with ?fullness right side; mildly tender- no rebound  Musculoskeletal: Normal range of motion.  Neurological: She is alert.  Skin: Skin is warm.  Psychiatric: She has a normal mood and affect.    MAU Course  Procedures Results for orders placed during the hospital encounter of 09/05/13 (from the past 24 hour(s))  CBC     Status: None   Collection Time    09/05/13  2:23 PM      Result Value Ref Range   WBC 5.7  4.0 - 10.5 K/uL   RBC 4.88  3.87 - 5.11 MIL/uL   Hemoglobin 12.8  12.0 - 15.0 g/dL   HCT 39.4  36.0 - 46.0 %   MCV 80.7  78.0 - 100.0 fL   MCH 26.2  26.0 - 34.0 pg   MCHC 32.5  30.0 - 36.0 g/dL   RDW 13.2  11.5 - 15.5 %   Platelets 193  150 - 400 K/uL  COMPREHENSIVE METABOLIC PANEL     Status: None   Collection Time    09/05/13  2:23 PM      Result Value Ref Range   Sodium 141  137 - 147 mEq/L   Potassium 4.0  3.7 - 5.3 mEq/L   Chloride 105  96 - 112 mEq/L   CO2 25  19 - 32 mEq/L   Glucose, Bld 92  70 - 99 mg/dL   BUN 11  6 - 23 mg/dL   Creatinine, Ser 0.62  0.50 - 1.10 mg/dL   Calcium 9.6  8.4 - 10.5 mg/dL   Total Protein 7.0  6.0 - 8.3 g/dL   Albumin 3.8  3.5 - 5.2 g/dL   AST 14  0 - 37 U/L   ALT 9  0 - 35 U/L   Alkaline Phosphatase 59  39 - 117 U/L   Total Bilirubin 0.4  0.3 - 1.2 mg/dL   GFR calc non Af Amer >90  >90 mL/min   GFR calc Af Amer >90  >90 mL/min  HCG, SERUM, QUALITATIVE     Status: None   Collection Time    09/05/13  2:23 PM      Result Value Ref Range   Preg, Serum NEGATIVE  NEGATIVE  URINALYSIS, ROUTINE W REFLEX MICROSCOPIC     Status: Abnormal   Collection Time    09/05/13  2:35 PM      Result Value Ref Range   Color, Urine YELLOW  YELLOW   APPearance CLEAR   CLEAR   Specific Gravity, Urine >1.030 (*) 1.005 -  1.030   pH 6.0  5.0 - 8.0   Glucose, UA NEGATIVE  NEGATIVE mg/dL   Hgb urine dipstick NEGATIVE  NEGATIVE   Bilirubin Urine NEGATIVE  NEGATIVE   Ketones, ur NEGATIVE  NEGATIVE mg/dL   Protein, ur NEGATIVE  NEGATIVE mg/dL   Urobilinogen, UA 0.2  0.0 - 1.0 mg/dL   Nitrite NEGATIVE  NEGATIVE   Leukocytes, UA NEGATIVE  NEGATIVE  POCT PREGNANCY, URINE     Status: None   Collection Time    09/05/13  2:41 PM      Result Value Ref Range   Preg Test, Ur NEGATIVE  NEGATIVE  US Transvaginal Non-ob  09/05/2013   CLINICAL DATA:  Pelvic pain, vaginal bleeding.  EXAM: TRANSABDOMINAL AND TRANSVAGINAL ULTRASOUND OF PELVIS  TECHNIQUE: Both transabdominal and transvaginal ultrasound examinations of the pelvis were performed. Transabdominal technique was performed for global imaging of the pelvis including uterus, ovaries, adnexal regions, and pelvic cul-de-sac. It was necessary to proceed with endovaginal exam following the transabdominal exam to visualize the endometrium and ovaries.  COMPARISON:  None  FINDINGS: Uterus  Measurements: 8.6 x 5.2 x 3.6 cm. No fibroids or other mass visualized.  Endometrium  Thickness: 1 mm.  No focal abnormality visualized.  Right ovary  Measurements: 2.9 x 2.8 x 2.0 cm. Normal appearance/no adnexal mass.  Left ovary  Measurements: 3.8 x 2.5 x 2.3 cm. Normal appearance/no adnexal mass.  Other findings  Physiologic free fluid is noted.  IMPRESSION: Normal pelvic ultrasound.   Electronically Signed   By: Sabino Dick M.D.   On: 09/05/2013 16:21   US Pelvis Complete  09/05/2013   CLINICAL DATA:  Pelvic pain, vaginal bleeding.  EXAM: TRANSABDOMINAL AND TRANSVAGINAL ULTRASOUND OF PELVIS  TECHNIQUE: Both transabdominal and transvaginal ultrasound examinations of the pelvis were performed. Transabdominal technique was performed for global imaging of the pelvis including uterus, ovaries, adnexal regions, and pelvic cul-de-sac. It was necessary  to proceed with endovaginal exam following the transabdominal exam to visualize the endometrium and ovaries.  COMPARISON:  None  FINDINGS: Uterus  Measurements: 8.6 x 5.2 x 3.6 cm. No fibroids or other mass visualized.  Endometrium  Thickness: 1 mm.  No focal abnormality visualized.  Right ovary  Measurements: 2.9 x 2.8 x 2.0 cm. Normal appearance/no adnexal mass.  Left ovary  Measurements: 3.8 x 2.5 x 2.3 cm. Normal appearance/no adnexal mass.  Other findings  Physiologic free fluid is noted.  IMPRESSION: Normal pelvic ultrasound.   Electronically Signed   By: Sabino Dick M.D.   On: 09/05/2013 16:21   Results for orders placed during the hospital encounter of 09/05/13 (from the past 24 hour(s))  CBC     Status: None   Collection Time    09/05/13  2:23 PM      Result Value Ref Range   WBC 5.7  4.0 - 10.5 K/uL   RBC 4.88  3.87 - 5.11 MIL/uL   Hemoglobin 12.8  12.0 - 15.0 g/dL   HCT 39.4  36.0 - 46.0 %   MCV 80.7  78.0 - 100.0 fL   MCH 26.2  26.0 - 34.0 pg   MCHC 32.5  30.0 - 36.0 g/dL   RDW 13.2  11.5 - 15.5 %   Platelets 193  150 - 400 K/uL  COMPREHENSIVE METABOLIC PANEL     Status: None   Collection Time    09/05/13  2:23 PM      Result Value Ref Range   Sodium 141  137 - 147 mEq/L   Potassium 4.0  3.7 - 5.3 mEq/L   Chloride 105  96 - 112 mEq/L   CO2 25  19 - 32 mEq/L   Glucose, Bld 92  70 - 99 mg/dL   BUN 11  6 - 23 mg/dL   Creatinine, Ser 0.62  0.50 - 1.10 mg/dL   Calcium 9.6  8.4 - 10.5 mg/dL   Total Protein 7.0  6.0 - 8.3 g/dL   Albumin 3.8  3.5 - 5.2 g/dL   AST 14  0 - 37 U/L   ALT 9  0 - 35 U/L   Alkaline Phosphatase 59  39 - 117 U/L   Total Bilirubin 0.4  0.3 - 1.2 mg/dL   GFR calc non Af Amer >90  >90 mL/min   GFR calc Af Amer >90  >90 mL/min  HCG, SERUM, QUALITATIVE     Status: None   Collection Time    09/05/13  2:23 PM      Result Value Ref Range   Preg, Serum NEGATIVE  NEGATIVE  URINALYSIS, ROUTINE W REFLEX MICROSCOPIC     Status: Abnormal   Collection Time     09/05/13  2:35 PM      Result Value Ref Range   Color, Urine YELLOW  YELLOW   APPearance CLEAR  CLEAR   Specific Gravity, Urine >1.030 (*) 1.005 - 1.030   pH 6.0  5.0 - 8.0   Glucose, UA NEGATIVE  NEGATIVE mg/dL   Hgb urine dipstick NEGATIVE  NEGATIVE   Bilirubin Urine NEGATIVE  NEGATIVE   Ketones, ur NEGATIVE  NEGATIVE mg/dL   Protein, ur NEGATIVE  NEGATIVE mg/dL   Urobilinogen, UA 0.2  0.0 - 1.0 mg/dL   Nitrite NEGATIVE  NEGATIVE   Leukocytes, UA NEGATIVE  NEGATIVE  POCT PREGNANCY, URINE     Status: None   Collection Time    09/05/13  2:41 PM      Result Value Ref Range   Preg Test, Ur NEGATIVE  NEGATIVE  WET PREP, GENITAL     Status: Abnormal   Collection Time    09/05/13  3:20 PM      Result Value Ref Range   Yeast Wet Prep HPF POC NONE SEEN  NONE SEEN   Trich, Wet Prep NONE SEEN  NONE SEEN   Clue Cells Wet Prep HPF POC FEW (*) NONE SEEN   WBC, Wet Prep HPF POC FEW (*) NONE SEEN  GC/chlamdyia pending Toradol 60mg  IM given with improvement in pain Depo Provera 150mg  IM given Pt to follow up with Dr. Jodi Mourning Pt advised to return to [facility] and counseling- pt has Medicaid and recommended [place] or [facility] Diflcuan 150mg  given in MAU for vaginal irritation Assessment and Plan  Pelvic pain- Ibuprofen 600mg  Rx Depression- recommend pt to see Behavioral Health and couseling F/u with Dr. [place] Pink Donalee Citrin 09/05/2013, 1:59 PM

## 2013-09-05 NOTE — MAU Note (Signed)
Patient states she started bleeding the middle of May and has bleeding every day, some heavy. Has lower abdominal pain off and on. Was seen at [facility] ED on 5-29 for bleeding.

## 2013-09-07 LAB — GC/CHLAMYDIA PROBE AMP
CT Probe RNA: NEGATIVE
GC Probe RNA: NEGATIVE

## 2013-09-12 ENCOUNTER — Ambulatory Visit: Payer: Medicaid Other | Admitting: Obstetrics

## 2013-09-23 ENCOUNTER — Ambulatory Visit (INDEPENDENT_AMBULATORY_CARE_PROVIDER_SITE_OTHER): Payer: Medicaid Other | Admitting: Obstetrics

## 2013-09-23 ENCOUNTER — Encounter: Payer: Self-pay | Admitting: Obstetrics

## 2013-09-23 VITALS — BP 122/81 | HR 84 | Ht 60.0 in | Wt 98.0 lb

## 2013-09-23 DIAGNOSIS — Z3009 Encounter for other general counseling and advice on contraception: Secondary | ICD-10-CM

## 2013-09-23 DIAGNOSIS — Z3202 Encounter for pregnancy test, result negative: Secondary | ICD-10-CM

## 2013-09-23 LAB — POCT URINE PREGNANCY: Preg Test, Ur: NEGATIVE

## 2013-09-23 NOTE — Progress Notes (Signed)
Subjective:    Stephanie Mack is a 23 y.o. female who presents for contraception counseling. The patient has no complaints today. The patient is sexually active. Pertinent past medical history: none.  The information documented in the HPI was reviewed and verified.  Menstrual History: OB History   Grav Para Term Preterm Abortions TAB SAB Ect Mult Living   2 1 0 1 1 0 1 0 0 0        No LMP recorded. Patient is not currently having periods (Reason: Needs Pregnancy Test).   Patient Active Problem List   Diagnosis Date Noted  . Adjustment disorder with depressed mood 08/13/2013  . Dysmenorrhea 06/17/2013  . PIH (pregnancy induced hypertension) 06/03/2013  . Unspecified symptom associated with female genital organs 05/03/2013  . Abdominal pain in female patient 12/04/2012  . Nausea and vomiting in adult patient 12/04/2012  . Acute URI 12/04/2012  . Pain 10/23/2012  . Acute PID (pelvic inflammatory disease) 10/23/2012  . Screening examination for venereal disease 10/23/2012  . Abnormal urine finding 10/23/2012  . Other symptoms involving abdomen and pelvis(789.9) 10/23/2012  . BV (bacterial vaginosis) 10/23/2012  . Severe preeclampsia 09/03/2012  . Cesarean delivery delivered 09/03/2012  . Pseudocyesis    Past Medical History  Diagnosis Date  . Depression     depression  . Bipolar disorder   . ADHD (attention deficit hyperactivity disorder)   . Anxiety   . Anemia   . ADHD (attention deficit hyperactivity disorder)   . Bipolar 1 disorder   . Chlamydia 05-31-10  . Pseudocyesis 2013    Seen in [facility] for percieved FM, abd distension. Normal exam.   . Gonorrhea contact, treated   . Pregnancy induced hypertension     Past Surgical History  Procedure Laterality Date  . Nasal septum surgery    . Cesarean section N/A 09/01/2012    Procedure:  Primary cesarean section with delivery of baby girl at 77.  Apgars 1/1.  ;  Surgeon: Frederico Hamman, MD;  Location: [facility];  Service:  Obstetrics;  Laterality: N/A;    Current outpatient prescriptions:ibuprofen (ADVIL,MOTRIN) 600 MG tablet, Take 1 tablet (600 mg total) by mouth every 6 (six) hours as needed., Disp: 30 tablet, Rfl: 0;  Prenatal Vit-Fe Fumarate-FA (PRENATAL MULTIVITAMIN) TABS tablet, Take 1 tablet by mouth daily at 12 noon., Disp: , Rfl:  Current facility-administered medications:medroxyPROGESTERone (DEPO-PROVERA) injection 150 mg, 150 mg, Intramuscular, Q90 days, Shelly Bombard, MD, 150 mg at 03/14/13 1033 No Known Allergies  History  Substance Use Topics  . Smoking status: Never Smoker   . Smokeless tobacco: Never Used  . Alcohol Use: No    Family History  Problem Relation Age of Onset  . Kidney disease Mother   . Hypertension Father   . Cancer Sister   . Asthma Brother   . Heart disease Neg Hx   . Diabetes Maternal Grandfather        Review of Systems Constitutional: negative for weight loss Genitourinary:negative for abnormal menstrual periods and vaginal discharge   Objective:   BP 122/81  Pulse 84  Ht 5' (1.524 m)  Wt 98 lb (44.453 kg)  BMI 19.14 kg/m2   PE:  Deferred       Lab Review Urine pregnancy test Labs reviewed yes Radiologic studies reviewed no  100% of 15 min visit spent on counseling and coordination of care.   Assessment:    23 y.o., starting OCP (estrogen/progesterone), no contraindications.   Plan:    All  questions answered. Contraception: OCP (estrogen/progesterone). Follow up in 2 weeks. OCP's start in 2 weeks with abstinence  No orders of the defined types were placed in this encounter.   Orders Placed This Encounter  Procedures  . Pregnancy, urine   Need to obtain previous records Follow up as needed.

## 2013-09-25 LAB — POCT URINE PREGNANCY: Preg Test, Ur: NEGATIVE

## 2013-09-25 NOTE — Addendum Note (Signed)
Addended by: Ladona Ridgel on: 09/25/2013 12:38 PM   Modules accepted: Orders

## 2013-10-07 ENCOUNTER — Ambulatory Visit: Payer: Medicaid Other | Admitting: Obstetrics

## 2013-10-16 ENCOUNTER — Ambulatory Visit (INDEPENDENT_AMBULATORY_CARE_PROVIDER_SITE_OTHER): Payer: Medicaid Other | Admitting: Obstetrics

## 2013-10-16 ENCOUNTER — Encounter: Payer: Self-pay | Admitting: Obstetrics

## 2013-10-16 VITALS — BP 126/79 | HR 102 | Temp 97.9°F | Ht 60.0 in | Wt 99.0 lb

## 2013-10-16 DIAGNOSIS — Z3202 Encounter for pregnancy test, result negative: Secondary | ICD-10-CM

## 2013-10-16 DIAGNOSIS — G44029 Chronic cluster headache, not intractable: Secondary | ICD-10-CM

## 2013-10-16 MED ORDER — BUTALBITAL-APAP-CAFFEINE 50-325-40 MG PO TABS: 1.0000 | ORAL_TABLET | Freq: Four times a day (QID) | ORAL | Status: DC | PRN

## 2013-10-16 NOTE — Progress Notes (Signed)
Patient ID: Stephanie Mack, female   DOB: [birth year], 23 y.o.   MRN: [id number]  Chief Complaint  Patient presents with  . Follow-up    Birth Control     HPI Stephanie Mack is a 23 y.o. female.  Headache.  HPI  Past Medical History  Diagnosis Date  . Depression     depression  . Bipolar disorder   . ADHD (attention deficit hyperactivity disorder)   . Anxiety   . Anemia   . ADHD (attention deficit hyperactivity disorder)   . Bipolar 1 disorder   . Chlamydia 05-31-10  . Pseudocyesis 2013    Seen in [facility] for percieved FM, abd distension. Normal exam.   . Gonorrhea contact, treated   . Pregnancy induced hypertension     Past Surgical History  Procedure Laterality Date  . Nasal septum surgery    . Cesarean section N/A 09/01/2012    Procedure:  Primary cesarean section with delivery of baby girl at 70.  Apgars 1/1.  ;  Surgeon: Frederico Hamman, MD;  Location: [facility];  Service: Obstetrics;  Laterality: N/A;    Family History  Problem Relation Age of Onset  . Kidney disease Mother   . Hypertension Father   . Cancer Sister   . Asthma Brother   . Heart disease Neg Hx   . Diabetes Maternal Grandfather     Social History History  Substance Use Topics  . Smoking status: Never Smoker   . Smokeless tobacco: Never Used  . Alcohol Use: No    No Known Allergies  Current Outpatient Prescriptions  Medication Sig Dispense Refill  . Prenatal Vit-Fe Fumarate-FA (PRENATAL MULTIVITAMIN) TABS tablet Take 1 tablet by mouth daily at 12 noon.      . butalbital-acetaminophen-caffeine (FIORICET) 50-325-40 MG per tablet Take 1-2 tablets by mouth every 6 (six) hours as needed for headache.  40 tablet  4  . ibuprofen (ADVIL,MOTRIN) 600 MG tablet Take 1 tablet (600 mg total) by mouth every 6 (six) hours as needed.  30 tablet  0   Current Facility-Administered Medications  Medication Dose Route Frequency Provider Last Rate Last Dose  . medroxyPROGESTERone (DEPO-PROVERA) injection 150 mg   150 mg Intramuscular Q90 days Shelly Bombard, MD   150 mg at 03/14/13 1033    Review of Systems Review of Systems Constitutional: negative for fatigue and weight loss Respiratory: negative for cough and wheezing Cardiovascular: negative for chest pain, fatigue and palpitations Gastrointestinal: negative for abdominal pain and change in bowel habits Genitourinary:negative Integument/breast: negative for nipple discharge Musculoskeletal:negative for myalgias Neurological: negative for gait problems and tremors Behavioral/Psych: negative for abusive relationship, depression Endocrine: negative for temperature intolerance     Blood pressure 126/79, pulse 102, temperature 97.9 F (36.6 C), height 5' (1.524 m), weight 99 lb (44.906 kg).  Physical Exam Physical Exam:  Deferred  100% of 10 min visit spent on counseling and coordination of care.   Data Reviewed Labs  Assessment    Headaches.  Chronic.     Plan    Fioricet Rx. Referred to Neurology  Orders Placed This Encounter  Procedures  . Ambulatory referral to Neurology    Referral Priority:  Routine    Referral Type:  Consultation    Referral Reason:  Specialty Services Required    Requested Specialty:  Neurology    Number of Visits Requested:  1   Meds ordered this encounter  Medications  . butalbital-acetaminophen-caffeine (FIORICET) 50-325-40 MG per tablet    Sig: Take  1-2 tablets by mouth every 6 (six) hours as needed for headache.    Dispense:  40 tablet    Refill:  4        Briyonna Omara A 10/16/2013, 3:18 PM

## 2013-10-22 ENCOUNTER — Ambulatory Visit: Payer: Medicaid Other | Admitting: Neurology

## 2013-10-25 ENCOUNTER — Telehealth: Payer: Self-pay | Admitting: Neurology

## 2013-10-25 ENCOUNTER — Ambulatory Visit: Payer: Self-pay | Admitting: Neurology

## 2013-10-25 NOTE — Telephone Encounter (Signed)
Patient no showed for an appointment today, 10/25/2013, at 0930.

## 2013-10-28 ENCOUNTER — Ambulatory Visit: Payer: Medicaid Other | Admitting: Obstetrics

## 2013-10-29 ENCOUNTER — Ambulatory Visit: Payer: Medicaid Other | Admitting: Neurology

## 2013-11-07 ENCOUNTER — Ambulatory Visit: Payer: Medicaid Other | Admitting: Neurology

## 2013-11-12 ENCOUNTER — Telehealth: Payer: Self-pay | Admitting: *Deleted

## 2013-11-12 NOTE — Telephone Encounter (Signed)
Patient called stating she needs a refill on her BP medication. That she does not want to have seizures.   Spoke with patient, patient states it has been a while since she has been on the medication. Patient states she does not have a PCP. Patient states she does not remember the name of the medication but thinks that it starts with an M. Looked through patients chart, patient was prescribed labetalol and was last refill in February 2015. Patient advised it has been a while since we have given her the medication and that if she is worried about having seizures she should go to urgent care and be seen that they would be able to prescribe her the medication there and that if she went somewhere lime [facility] that they would be able to establish care with her and treat her for her BP as her PCP. Patient voiced understanding.

## 2013-11-14 ENCOUNTER — Ambulatory Visit: Payer: Medicaid Other | Admitting: Obstetrics

## 2013-11-21 ENCOUNTER — Encounter: Payer: Self-pay | Admitting: Neurology

## 2013-11-27 ENCOUNTER — Ambulatory Visit (INDEPENDENT_AMBULATORY_CARE_PROVIDER_SITE_OTHER): Payer: Medicaid Other | Admitting: Obstetrics

## 2013-11-27 ENCOUNTER — Encounter: Payer: Self-pay | Admitting: Obstetrics

## 2013-11-27 VITALS — BP 138/110 | Temp 98.5°F | Ht 60.0 in | Wt 109.0 lb

## 2013-11-27 DIAGNOSIS — I10 Essential (primary) hypertension: Secondary | ICD-10-CM | POA: Insufficient documentation

## 2013-11-27 DIAGNOSIS — G44029 Chronic cluster headache, not intractable: Secondary | ICD-10-CM | POA: Insufficient documentation

## 2013-11-27 DIAGNOSIS — N912 Amenorrhea, unspecified: Secondary | ICD-10-CM

## 2013-11-27 MED ORDER — AMLODIPINE BESYLATE 5 MG PO TABS: 5.0000 mg | ORAL_TABLET | Freq: Every day | ORAL | Status: DC

## 2013-11-27 MED ORDER — TRIAMTERENE-HCTZ 37.5-25 MG PO CAPS: 1.0000 | ORAL_CAPSULE | Freq: Every day | ORAL | Status: DC

## 2013-11-27 MED ORDER — CARVEDILOL 6.25 MG PO TABS: 6.2500 mg | ORAL_TABLET | Freq: Two times a day (BID) | ORAL | Status: DC

## 2013-11-27 NOTE — Progress Notes (Signed)
Patient ID: Stephanie Mack, female   DOB: 05-Aug-1990, 23 y.o.   MRN: [id number]  Chief Complaint  Patient presents with  . Advice Only    HPI Stephanie Mack is a 23 y.o. female.  Presents for F/U postpartum of BP, headaches and no periods.  HPI  Past Medical History  Diagnosis Date  . Depression     depression  . Bipolar disorder   . ADHD (attention deficit hyperactivity disorder)   . Anxiety   . Anemia   . ADHD (attention deficit hyperactivity disorder)   . Bipolar 1 disorder   . Chlamydia 05-31-10  . Pseudocyesis 2013    Seen in [facility]U for percieved FM, abd distension. Normal exam.   . Gonorrhea contact, treated   . Pregnancy induced hypertension     Past Surgical History  Procedure Laterality Date  . Nasal septum surgery    . Cesarean section N/A 09/01/2012    Procedure:  Primary cesarean section with delivery of baby girl at 52.  Apgars 1/1.  ;  Surgeon: Frederico Hamman, MD;  Location: [facility];  Service: Obstetrics;  Laterality: N/A;    Family History  Problem Relation Age of Onset  . Kidney disease Mother   . Hypertension Father   . Cancer Sister   . Asthma Brother   . Heart disease Neg Hx   . Diabetes Maternal Grandfather     Social History History  Substance Use Topics  . Smoking status: Never Smoker   . Smokeless tobacco: Never Used  . Alcohol Use: No    No Known Allergies  Current Outpatient Prescriptions  Medication Sig Dispense Refill  . butalbital-acetaminophen-caffeine (FIORICET) 50-325-40 MG per tablet Take 1-2 tablets by mouth every 6 (six) hours as needed for headache.  40 tablet  4  . ibuprofen (ADVIL,MOTRIN) 600 MG tablet Take 1 tablet (600 mg total) by mouth every 6 (six) hours as needed.  30 tablet  0  . Prenatal Vit-Fe Fumarate-FA (PRENATAL MULTIVITAMIN) TABS tablet Take 1 tablet by mouth daily at 12 noon.      Marland Kitchen amLODipine (NORVASC) 5 MG tablet Take 1 tablet (5 mg total) by mouth daily.  30 tablet  11  . carvedilol (COREG) 6.25 MG  tablet Take 1 tablet (6.25 mg total) by mouth 2 (two) times daily with a meal.  60 tablet  11  . triamterene-hydrochlorothiazide (DYAZIDE) 37.5-25 MG per capsule Take 1 each (1 capsule total) by mouth daily.  30 capsule  11   Current Facility-Administered Medications  Medication Dose Route Frequency Provider Last Rate Last Dose  . medroxyPROGESTERone (DEPO-PROVERA) injection 150 mg  150 mg Intramuscular Q90 days Shelly Bombard, MD   150 mg at 03/14/13 1033    Review of Systems Review of Systems Constitutional: negative for fatigue and weight loss Respiratory: negative for cough and wheezing Cardiovascular: negative for chest pain, fatigue and palpitations Gastrointestinal: negative for abdominal pain and change in bowel habits Genitourinary:negative Integument/breast: negative for nipple discharge Musculoskeletal:negative for myalgias Neurological: negative for gait problems and tremors Behavioral/Psych: negative for abusive relationship, depression Endocrine: negative for temperature intolerance     Blood pressure 138/110, temperature 98.5 F (36.9 C), height 5' (1.524 m), weight 109 lb (49.442 kg).  Physical Exam Physical Exam General:   alert  Skin:   no rash or abnormalities  Lungs:   clear to auscultation bilaterally  Heart:   regular rate and rhythm, S1, S2 normal, no murmur, click, rub or gallop  Breasts:   normal  without suspicious masses, skin or nipple changes or axillary nodes  Abdomen:  normal findings: no organomegaly, soft, non-tender and no hernia  Pelvis:  External genitalia: normal general appearance Urinary system: urethral meatus normal and bladder without fullness, nontender Vaginal: normal without tenderness, induration or masses Cervix: normal appearance Adnexa: normal bimanual exam Uterus: anteverted and non-tender, normal size    100% of 10 min visit spent on counseling and coordination of care.   Data Reviewed Labs  Assessment     Hypertension Headaches Amenorrhea     Plan    Norvasc, Coreg and  Dyazide Rx. Referred to IM for BP follow up and health maintenance. Will need OCP's for cycle regulation.  Must get BP controlled first. F/U in 3 months.  Orders Placed This Encounter  Procedures  . Ambulatory referral to Internal Medicine    Referral Priority:  Routine    Referral Type:  Consultation    Referral Reason:  Specialty Services Required    Requested Specialty:  Internal Medicine    Number of Visits Requested:  1   Meds ordered this encounter  Medications  . amLODipine (NORVASC) 5 MG tablet    Sig: Take 1 tablet (5 mg total) by mouth daily.    Dispense:  30 tablet    Refill:  11  . triamterene-hydrochlorothiazide (DYAZIDE) 37.5-25 MG per capsule    Sig: Take 1 each (1 capsule total) by mouth daily.    Dispense:  30 capsule    Refill:  11  . carvedilol (COREG) 6.25 MG tablet    Sig: Take 1 tablet (6.25 mg total) by mouth 2 (two) times daily with a meal.    Dispense:  60 tablet    Refill:  11        Betzalel Umbarger A 11/27/2013, 3:28 PM

## 2013-11-28 ENCOUNTER — Telehealth: Payer: Self-pay

## 2013-11-28 NOTE — Telephone Encounter (Signed)
TOLD PATIENT OF REFERRAL TO [facility] FOR PRIMARY CARE - THEY WILL BE CONTACTING HER FOR APPT.

## 2013-11-29 ENCOUNTER — Emergency Department (HOSPITAL_COMMUNITY)
Admission: EM | Admit: 2013-11-29 | Discharge: 2013-11-29 | Disposition: A | Discharge status: Home / Self Care | Payer: Medicaid Other | Attending: Emergency Medicine | Admitting: Emergency Medicine

## 2013-11-29 ENCOUNTER — Encounter (HOSPITAL_COMMUNITY): Payer: Self-pay | Admitting: Emergency Medicine

## 2013-11-29 DIAGNOSIS — B9689 Other specified bacterial agents as the cause of diseases classified elsewhere: Secondary | ICD-10-CM | POA: Diagnosis not present

## 2013-11-29 DIAGNOSIS — R059 Cough, unspecified: Secondary | ICD-10-CM

## 2013-11-29 DIAGNOSIS — Z8619 Personal history of other infectious and parasitic diseases: Secondary | ICD-10-CM | POA: Insufficient documentation

## 2013-11-29 DIAGNOSIS — J3489 Other specified disorders of nose and nasal sinuses: Secondary | ICD-10-CM | POA: Diagnosis present

## 2013-11-29 DIAGNOSIS — N760 Acute vaginitis: Secondary | ICD-10-CM | POA: Insufficient documentation

## 2013-11-29 DIAGNOSIS — D649 Anemia, unspecified: Secondary | ICD-10-CM | POA: Insufficient documentation

## 2013-11-29 DIAGNOSIS — Z8659 Personal history of other mental and behavioral disorders: Secondary | ICD-10-CM | POA: Diagnosis not present

## 2013-11-29 DIAGNOSIS — A499 Bacterial infection, unspecified: Secondary | ICD-10-CM | POA: Diagnosis not present

## 2013-11-29 DIAGNOSIS — Z3202 Encounter for pregnancy test, result negative: Secondary | ICD-10-CM | POA: Diagnosis not present

## 2013-11-29 DIAGNOSIS — Z79899 Other long term (current) drug therapy: Secondary | ICD-10-CM | POA: Diagnosis not present

## 2013-11-29 DIAGNOSIS — R05 Cough: Secondary | ICD-10-CM | POA: Insufficient documentation

## 2013-11-29 DIAGNOSIS — R109 Unspecified abdominal pain: Secondary | ICD-10-CM

## 2013-11-29 DIAGNOSIS — R51 Headache: Secondary | ICD-10-CM | POA: Insufficient documentation

## 2013-11-29 LAB — POC URINE PREG, ED: Preg Test, Ur: NEGATIVE

## 2013-11-29 LAB — URINALYSIS, ROUTINE W REFLEX MICROSCOPIC
BILIRUBIN URINE: NEGATIVE
Glucose, UA: NEGATIVE mg/dL
HGB URINE DIPSTICK: NEGATIVE
Ketones, ur: NEGATIVE mg/dL
Leukocytes, UA: NEGATIVE
Nitrite: NEGATIVE
PH: 6.5 (ref 5.0–8.0)
Protein, ur: NEGATIVE mg/dL
SPECIFIC GRAVITY, URINE: 1.013 (ref 1.005–1.030)
Urobilinogen, UA: 0.2 mg/dL (ref 0.0–1.0)

## 2013-11-29 LAB — WET PREP, GENITAL
Trich, Wet Prep: NONE SEEN
YEAST WET PREP: NONE SEEN

## 2013-11-29 MED ORDER — METRONIDAZOLE 500 MG PO TABS: 500.0000 mg | ORAL_TABLET | Freq: Two times a day (BID) | ORAL | Status: DC

## 2013-11-29 NOTE — ED Notes (Signed)
Pt c/o nasal congestion and URI sx with body aches

## 2013-11-29 NOTE — ED Notes (Signed)
Charge nurse called to request acute bed.

## 2013-11-29 NOTE — ED Notes (Signed)
After reviewing records, it appears pt was in Dr. Jacelyn Grip office for F/U of HTN and headaches.

## 2013-11-29 NOTE — ED Provider Notes (Signed)
23 year old female presents for evaluation of cough, headache, nasal congestion, chest pain with coughing, abdominal pain, urinary frequency, dysuria, nausea, shortness of breath. This has been going on for 3 days. No vomiting, and diarrhea.  Her physical exam reveals a heart rate of 126 beats per minute and regular. She has a mildly distended and tender abdomen as well as frontal sinus pressure  She needs further evaluation given the tachycardia, review of the chart shows the this is very abnormal for her. Also she needs a evaluation for the tender and distended abdomen. Her acuity is being increased and she is being moved over to the main side.   Liam Graham, PA-C 11/29/13 [phone]

## 2013-11-29 NOTE — ED Provider Notes (Signed)
CSN: [id number]     Arrival date & time 11/29/13  1344 History   First MD Initiated Contact with Patient 11/29/13 1449     Chief Complaint  Patient presents with  . URI  . Nasal Congestion   Pt seen with medical student, I performed history/physical/documentation     Patient is a 23 y.o. female presenting with cough. The history is provided by the patient.  Cough Cough characteristics:  Non-productive Severity:  Moderate Onset quality:  Gradual Duration:  3 days Timing:  Intermittent Progression:  Worsening Chronicity:  New Smoker: no   Worsened by:  Nothing tried Ineffective treatments:  None tried Associated symptoms: headaches   pt reports cough, nasal congestion and mild HA for past 3 days She reports cough has triggered CP   She also reports abd pain with vaginal discharge for past several days.  She reports h/o gonorrhea  She denies h/o PE/DVT  Past Medical History  Diagnosis Date  . Depression     depression  . Bipolar disorder   . ADHD (attention deficit hyperactivity disorder)   . Anxiety   . Anemia   . ADHD (attention deficit hyperactivity disorder)   . Bipolar 1 disorder   . Chlamydia 05-31-10  . Pseudocyesis 2013    Seen in MAU for percieved FM, abd distension. Normal exam.   . Gonorrhea contact, treated   . Pregnancy induced hypertension    Past Surgical History  Procedure Laterality Date  . Nasal septum surgery    . Cesarean section N/A 09/01/2012    Procedure:  Primary cesarean section with delivery of baby girl at 22.  Apgars 1/1.  ;  Surgeon: Frederico Hamman, MD;  Location: [facility];  Service: Obstetrics;  Laterality: N/A;   Family History  Problem Relation Age of Onset  . Kidney disease Mother   . Hypertension Father   . Cancer Sister   . Asthma Brother   . Heart disease Neg Hx   . Diabetes Maternal Grandfather    History  Substance Use Topics  . Smoking status: Never Smoker   . Smokeless tobacco: Never Used  . Alcohol Use: No    OB History   Grav Para Term Preterm Abortions TAB SAB Ect Mult Living   2 1 0 1 1 0 1 0 0 0      Review of Systems  Respiratory: Positive for cough.   Cardiovascular:       CP with cough   Gastrointestinal: Negative for vomiting.  Genitourinary: Positive for dysuria and vaginal discharge.  Neurological: Positive for headaches.  All other systems reviewed and are negative.     Allergies  Review of patient's allergies indicates no known allergies.  Home Medications   Prior to Admission medications   Medication Sig Start Date End Date Taking? Authorizing Provider  butalbital-acetaminophen-caffeine (FIORICET) 50-325-40 MG per tablet Take 1-2 tablets by mouth every 6 (six) hours as needed for headache. 10/16/13 10/16/14 Yes Shelly Bombard, MD  carvedilol (COREG) 6.25 MG tablet Take 1 tablet (6.25 mg total) by mouth 2 (two) times daily with a meal. 11/27/13  Yes Shelly Bombard, MD  ibuprofen (ADVIL,MOTRIN) 600 MG tablet Take 1 tablet (600 mg total) by mouth every 6 (six) hours as needed. 09/05/13  Yes West Pugh, NP  Prenatal Vit-Fe Fumarate-FA (PRENATAL MULTIVITAMIN) TABS tablet Take 1 tablet by mouth daily at 12 noon.   Yes Historical Provider, MD  triamterene-hydrochlorothiazide (DYAZIDE) 37.5-25 MG per capsule Take 1 each (1 capsule  total) by mouth daily. 11/27/13  Yes Shelly Bombard, MD  amLODipine (NORVASC) 5 MG tablet Take 1 tablet (5 mg total) by mouth daily. 11/27/13   Shelly Bombard, MD   BP 113/75  Pulse 93  Temp(Src) 98.5 F (36.9 C) (Oral)  Resp 22  Ht 5' (1.524 m)  Wt 105 lb 9.6 oz (47.9 kg)  BMI 20.62 kg/m2  SpO2 100% Physical Exam CONSTITUTIONAL: Well developed/well nourished HEAD: Normocephalic/atraumatic EYES: EOMI/PERRL ENMT: Mucous membranes moist NECK: supple no meningeal signs SPINE:entire spine nontender CV: S1/S2 noted, no murmurs/rubs/gallops noted LUNGS: Lungs are clear to auscultation bilaterally, no apparent distress ABDOMEN: soft,  nontender, no rebound or guarding She is protuberant in lower abdomen but nontender GU:no cva tenderness No cmt.  No vaginal discharge.  No vaginal bleeding.  Chaperone present NEURO: Pt is awake/alert, moves all extremitiesx4 EXTREMITIES: pulses normal, full ROM SKIN: warm, color normal PSYCH: no abnormalities of mood noted  ED Course  Procedures  Pt well appearing no distress She reports cough/congestion/HA.  Lung sounds clear.  I doubt Pneumonia/chf/PE at this time.    For abd pain, her exam is unremarkable and she is not pregnant BV noted will treat Pt stable at this time Labs Review Labs Reviewed  WET PREP, GENITAL - Abnormal; Notable for the following:    Clue Cells Wet Prep HPF POC MANY (*)    WBC, Wet Prep HPF POC FEW (*)    All other components within normal limits  URINALYSIS, ROUTINE W REFLEX MICROSCOPIC - Abnormal; Notable for the following:    APPearance HAZY (*)    All other components within normal limits  GC/CHLAMYDIA PROBE AMP  HIV ANTIBODY (ROUTINE TESTING)  POC URINE PREG, ED     MDM   Final diagnoses:  Cough  Abdominal pain, unspecified abdominal location  Bacterial vaginosis    Nursing notes including past medical history and social history reviewed and considered in documentation Labs/vital reviewed and considered     Sharyon Cable, MD 11/29/13 2020

## 2013-11-29 NOTE — ED Notes (Signed)
"  I started feeling bad last night...headache and I can't breath through my nose".

## 2013-11-30 LAB — GC/CHLAMYDIA PROBE AMP
CT Probe RNA: POSITIVE — AB
GC PROBE AMP APTIMA: NEGATIVE

## 2013-11-30 LAB — HIV ANTIBODY (ROUTINE TESTING W REFLEX): HIV 1&2 Ab, 4th Generation: NONREACTIVE

## 2013-12-01 ENCOUNTER — Telehealth (HOSPITAL_BASED_OUTPATIENT_CLINIC_OR_DEPARTMENT_OTHER): Payer: Self-pay | Admitting: Emergency Medicine

## 2013-12-01 NOTE — Telephone Encounter (Signed)
Positive Chlamydia culture Chart sent to EDP for review 

## 2013-12-03 ENCOUNTER — Telehealth (HOSPITAL_COMMUNITY): Payer: Self-pay

## 2013-12-03 NOTE — ED Provider Notes (Signed)
Medical screening examination/treatment/procedure(s) were performed by resident physician or non-physician practitioner and as supervising physician I was immediately available for consultation/collaboration.   Pauline Good MD.   Billy Fischer, MD 12/03/13 1230

## 2013-12-03 NOTE — ED Notes (Signed)
Post ED Visit - Positive Culture Follow-up: Successful Patient Follow-Up  Culture assessed and recommendations reviewed by: []  [place], Pharm.D., BCPS []  Heide Guile, Pharm.D., BCPS []  Alycia Rossetti, Pharm.D., BCPS []  [place], Pharm.D., BCPS, AAHIVP []  Legrand Como, Pharm.D., BCPS, AAHIVP []  Hassie Bruce, Pharm.D. []  Milus Glazier, Pharm.D.  Positive Chlamydia culture  [x]  Patient discharged without antimicrobial prescription and treatment is now indicated []  Organism is resistant to prescribed ED discharge antimicrobial []  Patient with positive blood cultures  Changes discussed with ED provider: Harvie Heck New antibiotic prescription Azithromax 1 gram po x 1 Called to [facility] on [address] [phone]  Contacted patient, date 12/03/2013, time 10:34   Ileene Musa 12/03/2013, 10:33 AM

## 2013-12-31 ENCOUNTER — Other Ambulatory Visit: Payer: Self-pay

## 2014-01-06 ENCOUNTER — Encounter (HOSPITAL_COMMUNITY): Payer: Self-pay | Admitting: Emergency Medicine

## 2014-01-06 ENCOUNTER — Emergency Department (HOSPITAL_COMMUNITY)
Admission: EM | Admit: 2014-01-06 | Discharge: 2014-01-06 | Disposition: A | Discharge status: Home / Self Care | Payer: Medicaid Other | Attending: Emergency Medicine | Admitting: Emergency Medicine

## 2014-01-06 DIAGNOSIS — N939 Abnormal uterine and vaginal bleeding, unspecified: Secondary | ICD-10-CM | POA: Diagnosis present

## 2014-01-06 DIAGNOSIS — Z8619 Personal history of other infectious and parasitic diseases: Secondary | ICD-10-CM | POA: Insufficient documentation

## 2014-01-06 DIAGNOSIS — Z8659 Personal history of other mental and behavioral disorders: Secondary | ICD-10-CM | POA: Diagnosis not present

## 2014-01-06 DIAGNOSIS — Z3202 Encounter for pregnancy test, result negative: Secondary | ICD-10-CM | POA: Insufficient documentation

## 2014-01-06 DIAGNOSIS — N946 Dysmenorrhea, unspecified: Secondary | ICD-10-CM | POA: Diagnosis not present

## 2014-01-06 DIAGNOSIS — D649 Anemia, unspecified: Secondary | ICD-10-CM | POA: Insufficient documentation

## 2014-01-06 DIAGNOSIS — Z79899 Other long term (current) drug therapy: Secondary | ICD-10-CM | POA: Diagnosis not present

## 2014-01-06 LAB — COMPREHENSIVE METABOLIC PANEL
ALT: 10 U/L (ref 0–35)
AST: 14 U/L (ref 0–37)
Albumin: 3.9 g/dL (ref 3.5–5.2)
Alkaline Phosphatase: 63 U/L (ref 39–117)
Anion gap: 13 (ref 5–15)
BILIRUBIN TOTAL: 0.4 mg/dL (ref 0.3–1.2)
BUN: 9 mg/dL (ref 6–23)
CHLORIDE: 101 meq/L (ref 96–112)
CO2: 22 meq/L (ref 19–32)
CREATININE: 0.52 mg/dL (ref 0.50–1.10)
Calcium: 9.3 mg/dL (ref 8.4–10.5)
GLUCOSE: 117 mg/dL — AB (ref 70–99)
Potassium: 3.3 mEq/L — ABNORMAL LOW (ref 3.7–5.3)
Sodium: 136 mEq/L — ABNORMAL LOW (ref 137–147)
Total Protein: 7.6 g/dL (ref 6.0–8.3)

## 2014-01-06 LAB — URINE MICROSCOPIC-ADD ON

## 2014-01-06 LAB — URINALYSIS, ROUTINE W REFLEX MICROSCOPIC
Bilirubin Urine: NEGATIVE
Glucose, UA: NEGATIVE mg/dL
Ketones, ur: NEGATIVE mg/dL
LEUKOCYTES UA: NEGATIVE
Nitrite: NEGATIVE
PH: 6.5 (ref 5.0–8.0)
PROTEIN: 30 mg/dL — AB
Specific Gravity, Urine: 1.025 (ref 1.005–1.030)
UROBILINOGEN UA: 1 mg/dL (ref 0.0–1.0)

## 2014-01-06 LAB — CBC WITH DIFFERENTIAL/PLATELET
Basophils Absolute: 0 10*3/uL (ref 0.0–0.1)
Basophils Relative: 0 % (ref 0–1)
Eosinophils Absolute: 0.3 10*3/uL (ref 0.0–0.7)
Eosinophils Relative: 5 % (ref 0–5)
HEMATOCRIT: 38.7 % (ref 36.0–46.0)
HEMOGLOBIN: 12.8 g/dL (ref 12.0–15.0)
LYMPHS ABS: 2.3 10*3/uL (ref 0.7–4.0)
LYMPHS PCT: 44 % (ref 12–46)
MCH: 26 pg (ref 26.0–34.0)
MCHC: 33.1 g/dL (ref 30.0–36.0)
MCV: 78.5 fL (ref 78.0–100.0)
MONO ABS: 0.5 10*3/uL (ref 0.1–1.0)
Monocytes Relative: 9 % (ref 3–12)
Neutro Abs: 2.3 10*3/uL (ref 1.7–7.7)
Neutrophils Relative %: 42 % — ABNORMAL LOW (ref 43–77)
Platelets: 229 10*3/uL (ref 150–400)
RBC: 4.93 MIL/uL (ref 3.87–5.11)
RDW: 13.1 % (ref 11.5–15.5)
WBC: 5.4 10*3/uL (ref 4.0–10.5)

## 2014-01-06 LAB — PREGNANCY, URINE: Preg Test, Ur: NEGATIVE

## 2014-01-06 LAB — LIPASE, BLOOD: Lipase: 56 U/L (ref 11–59)

## 2014-01-06 MED ORDER — IBUPROFEN 400 MG PO TABS
400.0000 mg | ORAL_TABLET | Freq: Once | ORAL | Status: AC
  Administered 2014-01-06: 400 mg via ORAL
  Filled 2014-01-06: qty 1

## 2014-01-06 NOTE — ED Notes (Signed)
Pt. Stated, I started bleeding down there yesterday , I guess my vagina. Also some abdominal pain

## 2014-01-06 NOTE — ED Provider Notes (Signed)
CSN: [id number]     Arrival date & time 01/06/14  1620 History   First MD Initiated Contact with Patient 01/06/14 1754     Chief Complaint  Patient presents with  . Vaginal Bleeding  . Abdominal Pain     (Consider location/radiation/quality/duration/timing/severity/associated sxs/prior Treatment) HPI  Ytzel Gubler is a 23 y.o. female complaining of vaginal bleeding onset yesterday. Patient cannot remember when her last menstrual period was she also reports a suprapubic abdominal pain which she rates as 9/10, no exacerbating or alleviating factors identified. No pain medication taken prior to arrival. She denies dysuria, urinary frequency, nausea, vomiting, change in bowel habits, chest pain, shortness of breath, palpitations.    Past Medical History  Diagnosis Date  . Depression     depression  . Bipolar disorder   . ADHD (attention deficit hyperactivity disorder)   . Anxiety   . Anemia   . ADHD (attention deficit hyperactivity disorder)   . Bipolar 1 disorder   . Chlamydia 05-31-10  . Pseudocyesis 2013    Seen in MAU for percieved FM, abd distension. Normal exam.   . Gonorrhea contact, treated   . Pregnancy induced hypertension    Past Surgical History  Procedure Laterality Date  . Nasal septum surgery    . Cesarean section N/A 09/01/2012    Procedure:  Primary cesarean section with delivery of baby girl at 36.  Apgars 1/1.  ;  Surgeon: Frederico Hamman, MD;  Location: [facility];  Service: Obstetrics;  Laterality: N/A;   Family History  Problem Relation Age of Onset  . Kidney disease Mother   . Hypertension Father   . Cancer Sister   . Asthma Brother   . Heart disease Neg Hx   . Diabetes Maternal Grandfather    History  Substance Use Topics  . Smoking status: Never Smoker   . Smokeless tobacco: Never Used  . Alcohol Use: No   OB History   Grav Para Term Preterm Abortions TAB SAB Ect Mult Living   2 1 0 1 1 0 1 0 0 0      Review of Systems  10 systems  reviewed and found to be negative, except as noted in the HPI.   Allergies  Review of patient's allergies indicates no known allergies.  Home Medications   Prior to Admission medications   Medication Sig Start Date End Date Taking? Authorizing Provider  carvedilol (COREG) 6.25 MG tablet Take 1 tablet (6.25 mg total) by mouth 2 (two) times daily with a meal. 11/27/13  Yes Shelly Bombard, MD  Prenatal Vit-Fe Fumarate-FA (PRENATAL MULTIVITAMIN) TABS tablet Take 1 tablet by mouth daily at 12 noon.   Yes Historical Provider, MD  triamterene-hydrochlorothiazide (DYAZIDE) 37.5-25 MG per capsule Take 1 each (1 capsule total) by mouth daily. 11/27/13  Yes Shelly Bombard, MD   BP 129/90  Pulse 97  Temp(Src) 98.2 F (36.8 C) (Oral)  Resp 16  Ht 5' (1.524 m)  Wt 108 lb 5 oz (49.13 kg)  BMI 21.15 kg/m2  SpO2 100%  LMP 08/07/2013 Physical Exam  Nursing note and vitals reviewed. Constitutional: She is oriented to person, place, and time. She appears well-developed and well-nourished. No distress.  HENT:  Head: Normocephalic and atraumatic.  Mouth/Throat: Oropharynx is clear and moist.  Eyes: Conjunctivae and EOM are normal. Pupils are equal, round, and reactive to light.  Neck: Normal range of motion. Neck supple. No thyromegaly present.  Cardiovascular: Normal rate, regular rhythm and intact distal pulses.  Pulmonary/Chest: Effort normal and breath sounds normal. No stridor. No respiratory distress. She has no wheezes. She has no rales. She exhibits no tenderness.  Abdominal: Soft. Bowel sounds are normal. She exhibits no distension and no mass. There is tenderness. There is no rebound and no guarding.  Mild suprapubic tenderness palpation with no guarding or rebound.   No tenderness to palpation over McBurney's point, Rovsing, obturator and psoas are negative. Murphy sign negative.  Musculoskeletal: Normal range of motion.  Neurological: She is alert and oriented to person, place, and  time.  Psychiatric: She has a normal mood and affect.    ED Course  Procedures (including critical care time) Labs Review Labs Reviewed  URINALYSIS, ROUTINE W REFLEX MICROSCOPIC - Abnormal; Notable for the following:    APPearance CLOUDY (*)    Hgb urine dipstick LARGE (*)    Protein, ur 30 (*)    All other components within normal limits  URINE MICROSCOPIC-ADD ON - Abnormal; Notable for the following:    Squamous Epithelial / LPF MANY (*)    Bacteria, UA FEW (*)    All other components within normal limits  CBC WITH DIFFERENTIAL - Abnormal; Notable for the following:    Neutrophils Relative % 42 (*)    All other components within normal limits  COMPREHENSIVE METABOLIC PANEL - Abnormal; Notable for the following:    Sodium 136 (*)    Potassium 3.3 (*)    Glucose, Bld 117 (*)    All other components within normal limits  PREGNANCY, URINE  LIPASE, BLOOD    Imaging Review No results found.   EKG Interpretation None      MDM   Final diagnoses:  Dysmenorrhea    Filed Vitals:   01/06/14 1649  BP: 129/90  Pulse: 97  Temp: 98.2 F (36.8 C)  TempSrc: Oral  Resp: 16  Height: 5' (1.524 m)  Weight: 108 lb 5 oz (49.13 kg)  SpO2: 100%    Medications  ibuprofen (ADVIL,MOTRIN) tablet 400 mg (not administered)    Seville Brick is a 23 y.o. female presenting with vaginal bleeding and suprapubic pain. No pain medication taken prior to arrival. Patient is unclear wearing her last menstrual period was. Vital signs with no abnormalities. Serial exams are benign. No significant abnormalities on blood work. Urinalysis is not consistent with infection. Patient is not pregnant. Advised Motrin and followup with OB/GYN  Evaluation does not show pathology that would require ongoing emergent intervention or inpatient treatment. Pt is hemodynamically stable and mentating appropriately. Discussed findings and plan with patient/guardian, who agrees with care plan. All questions  answered. Return precautions discussed and outpatient follow up given.    Monico Blitz, PA-C 01/06/14 1922

## 2014-01-06 NOTE — Discharge Instructions (Signed)
For pain control please take ibuprofen (also known as Motrin or Advil) 800mg  (this is normally 4 over the counter pills) up to 3 times a day  . Take with food to minimize stomach irritation.  Please follow with your primary care doctor in the next 2 days for a check-up. They must obtain records for further management.   Do not hesitate to return to the Emergency Department for any new, worsening or concerning symptoms.    Dysmenorrhea Dysmenorrhea is pain during a menstrual period. You will have pain in the lower belly (abdomen). The pain is caused by the tightening (contracting) of the muscles of the uterus. The pain can be minor or severe. Headache, feeling sick to your stomach (nausea), throwing up (vomiting), or low back pain may occur with this condition. HOME CARE  Only take medicine as told by your doctor.  Place a heating pad or hot water bottle on your lower back or belly. Do not sleep with a heating pad.  Exercise may help lessen the pain.  Massage the lower back or belly.  Stop smoking.  Avoid alcohol and caffeine. GET HELP IF:   Your pain does not get better with medicine.  You have pain during sex.  Your pain gets worse while taking pain medicine.  Your period bleeding is heavier than normal.  You keep feeling sick to your stomach or keep throwing up. GET HELP RIGHT AWAY IF: You pass out (faint). Document Released: 06/17/2008 Document Revised: 03/26/2013 Document Reviewed: 09/06/2012 [facility] Patient Information 2015 [place], [place]. This information is not intended to replace advice given to you by your health care provider. Make sure you discuss any questions you have with your health care provider.

## 2014-01-07 NOTE — ED Provider Notes (Signed)
Medical screening examination/treatment/procedure(s) were performed by non-physician practitioner and as supervising physician I was immediately available for consultation/collaboration.   EKG Interpretation None       Leota Jacobsen, MD 01/07/14 1213

## 2014-01-21 ENCOUNTER — Encounter (HOSPITAL_COMMUNITY): Payer: Self-pay | Admitting: *Deleted

## 2014-01-21 ENCOUNTER — Other Ambulatory Visit: Payer: Self-pay | Admitting: Obstetrics

## 2014-01-21 ENCOUNTER — Inpatient Hospital Stay (HOSPITAL_COMMUNITY)
Admission: AD | Admit: 2014-01-21 | Discharge: 2014-01-21 | Disposition: A | Discharge status: Home / Self Care | Payer: Medicaid Other | Source: Ambulatory Visit | Attending: Obstetrics | Admitting: Obstetrics

## 2014-01-21 DIAGNOSIS — B349 Viral infection, unspecified: Secondary | ICD-10-CM

## 2014-01-21 DIAGNOSIS — R112 Nausea with vomiting, unspecified: Secondary | ICD-10-CM | POA: Diagnosis present

## 2014-01-21 DIAGNOSIS — A5609 Other chlamydial infection of lower genitourinary tract: Secondary | ICD-10-CM

## 2014-01-21 LAB — URINE MICROSCOPIC-ADD ON

## 2014-01-21 LAB — URINALYSIS, ROUTINE W REFLEX MICROSCOPIC
Bilirubin Urine: NEGATIVE
GLUCOSE, UA: NEGATIVE mg/dL
KETONES UR: NEGATIVE mg/dL
LEUKOCYTES UA: NEGATIVE
Nitrite: NEGATIVE
PROTEIN: 30 mg/dL — AB
Specific Gravity, Urine: 1.025 (ref 1.005–1.030)
Urobilinogen, UA: 0.2 mg/dL (ref 0.0–1.0)
pH: 6 (ref 5.0–8.0)

## 2014-01-21 LAB — POCT PREGNANCY, URINE: Preg Test, Ur: NEGATIVE

## 2014-01-21 MED ORDER — AZITHROMYCIN 250 MG PO TABS: 1000.0000 mg | ORAL_TABLET | Freq: Once | ORAL | Status: DC

## 2014-01-21 MED ORDER — CEFIXIME 400 MG PO TABS: 400.0000 mg | ORAL_TABLET | Freq: Every day | ORAL | Status: DC

## 2014-01-21 NOTE — MAU Note (Signed)
Patient states she has been having lower abdominal pain for about 2 weeks. Vomited x 1 yesterday. Brown discharge, no bleeding.

## 2014-01-21 NOTE — MAU Provider Note (Signed)
History     CSN: [id number]  Arrival date and time: 01/21/14 1344   First Provider Initiated Contact with Patient 01/21/14 1438      Chief Complaint  Patient presents with  . Possible Pregnancy  . Abdominal Pain   Possible Pregnancy Associated symptoms include coughing, nausea and vomiting. Pertinent negatives include no abdominal pain, chills, congestion, diaphoresis, fever or sore throat.  Abdominal Pain Associated symptoms include nausea and vomiting. Pertinent negatives include no constipation, diarrhea, dysuria, fever or frequency.    Pt is a 23 yo G2P0110 here with report of breast tenderness that started two weeks ago.  Also reports nausea and vomiting x 2 in the past 24 hours.  Denies fever, body aches, or chills.  Patient's last menstrual period was 11/05/2013.  No current form of birth control method.     Past Medical History  Diagnosis Date  . Depression     depression  . Bipolar disorder   . ADHD (attention deficit hyperactivity disorder)   . Anxiety   . Anemia   . ADHD (attention deficit hyperactivity disorder)   . Bipolar 1 disorder   . Chlamydia 05-31-10  . Pseudocyesis 2013    Seen in MAU for percieved FM, abd distension. Normal exam.   . Gonorrhea contact, treated   . Pregnancy induced hypertension     Past Surgical History  Procedure Laterality Date  . Nasal septum surgery    . Cesarean section N/A 09/01/2012    Procedure:  Primary cesarean section with delivery of baby girl at 56.  Apgars 1/1.  ;  Surgeon: Frederico Hamman, MD;  Location: [facility];  Service: Obstetrics;  Laterality: N/A;    Family History  Problem Relation Age of Onset  . Kidney disease Mother   . Hypertension Father   . Cancer Sister   . Asthma Brother   . Heart disease Neg Hx   . Diabetes Maternal Grandfather     History  Substance Use Topics  . Smoking status: Never Smoker   . Smokeless tobacco: Never Used  . Alcohol Use: No    Allergies: No Known  Allergies  Facility-administered medications prior to admission  Medication Dose Route Frequency Provider Last Rate Last Dose  . medroxyPROGESTERone (DEPO-PROVERA) injection 150 mg  150 mg Intramuscular Q90 days Shelly Bombard, MD   150 mg at 03/14/13 1033   Prescriptions prior to admission  Medication Sig Dispense Refill  . carvedilol (COREG) 6.25 MG tablet Take 1 tablet (6.25 mg total) by mouth 2 (two) times daily with a meal.  60 tablet  11  . triamterene-hydrochlorothiazide (DYAZIDE) 37.5-25 MG per capsule Take 1 each (1 capsule total) by mouth daily.  30 capsule  11    Review of Systems  Constitutional: Negative for fever, chills and diaphoresis.  HENT: Negative for congestion and sore throat.   Respiratory: Positive for cough.   Gastrointestinal: Positive for nausea and vomiting. Negative for abdominal pain, diarrhea and constipation.  Genitourinary: Negative for dysuria, urgency and frequency.  All other systems reviewed and are negative.  Physical Exam   Blood pressure 122/87, pulse 105, temperature 98.7 F (37.1 C), temperature source Oral, resp. rate 16, height 5\' 2"  (1.575 m), weight 49.714 kg (109 lb 9.6 oz), last menstrual period 08/07/2013, SpO2 100.00%.  Physical Exam  Constitutional: She is oriented to person, place, and time. She appears well-developed and well-nourished. No distress.  HENT:  Head: Normocephalic.  Neck: Normal range of motion. Neck supple.  Cardiovascular: Normal  rate, regular rhythm and normal heart sounds.   Respiratory: Effort normal and breath sounds normal. No respiratory distress.  Musculoskeletal: Normal range of motion. She exhibits no edema.  Neurological: She is alert and oriented to person, place, and time. She has normal reflexes.  Skin: Skin is warm and dry.    MAU Course  Procedures  Results for orders placed during the hospital encounter of 01/21/14 (from the past 24 hour(s))  URINALYSIS, ROUTINE W REFLEX MICROSCOPIC      Status: Abnormal   Collection Time    01/21/14  2:15 PM      Result Value Ref Range   Color, Urine YELLOW  YELLOW   APPearance HAZY (*) CLEAR   Specific Gravity, Urine 1.025  1.005 - 1.030   pH 6.0  5.0 - 8.0   Glucose, UA NEGATIVE  NEGATIVE mg/dL   Hgb urine dipstick MODERATE (*) NEGATIVE   Bilirubin Urine NEGATIVE  NEGATIVE   Ketones, ur NEGATIVE  NEGATIVE mg/dL   Protein, ur 30 (*) NEGATIVE mg/dL   Urobilinogen, UA 0.2  0.0 - 1.0 mg/dL   Nitrite NEGATIVE  NEGATIVE   Leukocytes, UA NEGATIVE  NEGATIVE  URINE MICROSCOPIC-ADD ON     Status: Abnormal   Collection Time    01/21/14  2:15 PM      Result Value Ref Range   Squamous Epithelial / LPF MANY (*) RARE   WBC, UA 0-2  <3 WBC/hpf   RBC / HPF 0-2  <3 RBC/hpf   Bacteria, UA FEW (*) RARE  POCT PREGNANCY, URINE     Status: None   Collection Time    01/21/14  2:18 PM      Result Value Ref Range   Preg Test, Ur NEGATIVE  NEGATIVE    Assessment and Plan  Viral Illness  Plan: Increase fluids Tylenol Cold and Flu prn Follow-up if worsening of symptoms  KARIM, Laya Letendre N 01/21/2014, 2:40 PM

## 2014-01-21 NOTE — MAU Note (Signed)
Abdomen is enlarged. Unable to identify a continual fundal height, no fetal heart tones heard.

## 2014-02-03 ENCOUNTER — Encounter (HOSPITAL_COMMUNITY): Payer: Self-pay | Admitting: *Deleted

## 2014-02-07 ENCOUNTER — Emergency Department (HOSPITAL_COMMUNITY)
Admission: EM | Admit: 2014-02-07 | Discharge: 2014-02-07 | Disposition: A | Discharge status: Home / Self Care | Payer: Medicaid Other | Attending: Emergency Medicine | Admitting: Emergency Medicine

## 2014-02-07 ENCOUNTER — Encounter (HOSPITAL_COMMUNITY): Payer: Self-pay | Admitting: *Deleted

## 2014-02-07 DIAGNOSIS — Y9389 Activity, other specified: Secondary | ICD-10-CM | POA: Insufficient documentation

## 2014-02-07 DIAGNOSIS — W458XXA Other foreign body or object entering through skin, initial encounter: Secondary | ICD-10-CM | POA: Insufficient documentation

## 2014-02-07 DIAGNOSIS — Z8619 Personal history of other infectious and parasitic diseases: Secondary | ICD-10-CM | POA: Insufficient documentation

## 2014-02-07 DIAGNOSIS — Z8659 Personal history of other mental and behavioral disorders: Secondary | ICD-10-CM | POA: Insufficient documentation

## 2014-02-07 DIAGNOSIS — Z789 Other specified health status: Secondary | ICD-10-CM

## 2014-02-07 DIAGNOSIS — S00551A Superficial foreign body of lip, initial encounter: Secondary | ICD-10-CM | POA: Insufficient documentation

## 2014-02-07 DIAGNOSIS — Z79899 Other long term (current) drug therapy: Secondary | ICD-10-CM | POA: Diagnosis not present

## 2014-02-07 DIAGNOSIS — Y9289 Other specified places as the place of occurrence of the external cause: Secondary | ICD-10-CM | POA: Diagnosis not present

## 2014-02-07 DIAGNOSIS — Z862 Personal history of diseases of the blood and blood-forming organs and certain disorders involving the immune mechanism: Secondary | ICD-10-CM | POA: Diagnosis not present

## 2014-02-07 DIAGNOSIS — Z792 Long term (current) use of antibiotics: Secondary | ICD-10-CM | POA: Diagnosis not present

## 2014-02-07 NOTE — ED Provider Notes (Signed)
CSN: [id number]     Arrival date & time 02/07/14  1339 History  This chart was scribed for non-physician practitioner, Quincy Carnes, PA-C,working with Artis Delay, MD, by Marlowe Kays, ED Scribe. This patient was seen in room TR09C/TR09C and the patient's care was started at 2:26 PM.  Chief Complaint  Patient presents with  . Oral Swelling   The history is provided by the patient. No language interpreter was used.    HPI Comments:  Stephanie Mack is a 23 y.o. female with PMH of bipolar disorder, depression and anxiety, ADHD and anemia who presents to the Emergency Department complaining of a lip piercing that is swelling and causing moderate pain that started several days ago. She reports a "bump" in the area. Pt reports letting her cousin place the piercing over a year ago. She reports touching the area makes the pain worse. She denies fever, chills, nausea, vomiting, warmth or redness of the area.  Patient states "i just want it removed".  Past Medical History  Diagnosis Date  . Depression     depression  . Bipolar disorder   . ADHD (attention deficit hyperactivity disorder)   . Anxiety   . Anemia   . ADHD (attention deficit hyperactivity disorder)   . Bipolar 1 disorder   . Chlamydia 05-31-10  . Pseudocyesis 2013    Seen in MAU for percieved FM, abd distension. Normal exam.   . Gonorrhea contact, treated   . Pregnancy induced hypertension    Past Surgical History  Procedure Laterality Date  . Nasal septum surgery    . Cesarean section N/A 09/01/2012    Procedure:  Primary cesarean section with delivery of baby girl at 93.  Apgars 1/1.  ;  Surgeon: Frederico Hamman, MD;  Location: [facility];  Service: Obstetrics;  Laterality: N/A;   Family History  Problem Relation Age of Onset  . Kidney disease Mother   . Hypertension Father   . Cancer Sister   . Asthma Brother   . Heart disease Neg Hx   . Diabetes Maternal Grandfather    History  Substance Use Topics  . Smoking  status: Never Smoker   . Smokeless tobacco: Never Used  . Alcohol Use: No   OB History    Gravida Para Term Preterm AB TAB SAB Ectopic Multiple Living   2 1 0 1 1 0 1 0 0 0      Review of Systems  Constitutional: Negative for fever and chills.  HENT: Positive for facial swelling.   Gastrointestinal: Negative for nausea and vomiting.  Skin: Negative for color change.  All other systems reviewed and are negative.   Allergies  Review of patient's allergies indicates no known allergies.  Home Medications   Prior to Admission medications   Medication Sig Start Date End Date Taking? Authorizing Provider  azithromycin (ZITHROMAX) 250 MG tablet Take 4 tablets (1,000 mg total) by mouth once. 01/21/14   Shelly Bombard, MD  carvedilol (COREG) 6.25 MG tablet Take 1 tablet (6.25 mg total) by mouth 2 (two) times daily with a meal. 11/27/13   Shelly Bombard, MD  cefixime (SUPRAX) 400 MG tablet Take 1 tablet (400 mg total) by mouth daily. 01/21/14   Shelly Bombard, MD  triamterene-hydrochlorothiazide (DYAZIDE) 37.5-25 MG per capsule Take 1 each (1 capsule total) by mouth daily. 11/27/13   Shelly Bombard, MD   Triage Vitals: BP 123/65 mmHg  Pulse 88  Temp(Src) 98.2 F (36.8 C) (Oral)  Resp 18  SpO2 100%  LMP 12/18/2013 Physical Exam  Constitutional: She is oriented to person, place, and time. She appears well-developed and well-nourished.  HENT:  Head: Normocephalic and atraumatic.  Mouth/Throat: Oropharynx is clear and moist.  Piercing in right upper lip with no surrounding swelling, erythema, drainage, bleeding or signs of infection. Piercing embedded in lip and cannot easily be removed from either end.  Eyes: Conjunctivae and EOM are normal. Pupils are equal, round, and reactive to light.  Neck: Normal range of motion.  Cardiovascular: Normal rate, regular rhythm and normal heart sounds.   Pulmonary/Chest: Effort normal and breath sounds normal.  Abdominal: Soft. Bowel sounds are  normal.  Musculoskeletal: Normal range of motion.  Neurological: She is alert and oriented to person, place, and time.  Skin: Skin is warm and dry.  Psychiatric: She has a normal mood and affect.  Nursing note and vitals reviewed.   ED Course  Procedures (including critical care time) DIAGNOSTIC STUDIES: Oxygen Saturation is 100% on RA, normal by my interpretation.   COORDINATION OF CARE: 2:30 PM- Will refer to oral surgeon and maxillofacial. Pt verbalizes understanding and agrees to plan.  Medications - No data to display  Labs Review Labs Reviewed - No data to display  Imaging Review No results found.   EKG Interpretation None      MDM   Final diagnoses:  Body piercing   Upper lip piercing without signs of infection.  Piercing was placed over 1 year ago and is deeply embedded into lip, cannot be removed without incision. Given the location of piercing, i am hesitant to remove it due to risk of facial scarring and/or lip deformity.  Patient referred to oral/maxillofacial surgeon and plastics for proper removal.  Discussed plan with patient, he/she acknowledged understanding and agreed with plan of care.  Return precautions given for new or worsening symptoms.  I personally performed the services described in this documentation, which was scribed in my presence. The recorded information has been reviewed and is accurate.  Larene Pickett, PA-C 02/07/14 1449  Artis Delay, MD 02/07/14 [phone]

## 2014-02-07 NOTE — Discharge Instructions (Signed)
Follow-up with one of the oral or maxillofacial surgeons to have piercing removed. Return to the ED for new or worsening symptoms.

## 2014-02-07 NOTE — ED Notes (Signed)
Pt reports pain to her piercing right upper lip and now has bump there, pt was piercing removed. No distress noted.

## 2014-02-18 ENCOUNTER — Emergency Department (HOSPITAL_COMMUNITY): Payer: Medicaid Other

## 2014-02-18 ENCOUNTER — Encounter (HOSPITAL_COMMUNITY): Payer: Self-pay | Admitting: Adult Health

## 2014-02-18 ENCOUNTER — Emergency Department (HOSPITAL_COMMUNITY)
Admission: EM | Admit: 2014-02-18 | Discharge: 2014-02-18 | Disposition: A | Discharge status: Home / Self Care | Payer: Medicaid Other | Attending: Emergency Medicine | Admitting: Emergency Medicine

## 2014-02-18 DIAGNOSIS — Z862 Personal history of diseases of the blood and blood-forming organs and certain disorders involving the immune mechanism: Secondary | ICD-10-CM | POA: Diagnosis not present

## 2014-02-18 DIAGNOSIS — R109 Unspecified abdominal pain: Secondary | ICD-10-CM | POA: Diagnosis present

## 2014-02-18 DIAGNOSIS — N8320 Unspecified ovarian cysts: Secondary | ICD-10-CM | POA: Diagnosis not present

## 2014-02-18 DIAGNOSIS — Z3202 Encounter for pregnancy test, result negative: Secondary | ICD-10-CM | POA: Insufficient documentation

## 2014-02-18 DIAGNOSIS — N83201 Unspecified ovarian cyst, right side: Secondary | ICD-10-CM

## 2014-02-18 DIAGNOSIS — Z8659 Personal history of other mental and behavioral disorders: Secondary | ICD-10-CM | POA: Diagnosis not present

## 2014-02-18 DIAGNOSIS — Z79899 Other long term (current) drug therapy: Secondary | ICD-10-CM | POA: Diagnosis not present

## 2014-02-18 DIAGNOSIS — N946 Dysmenorrhea, unspecified: Secondary | ICD-10-CM

## 2014-02-18 DIAGNOSIS — Z8619 Personal history of other infectious and parasitic diseases: Secondary | ICD-10-CM | POA: Insufficient documentation

## 2014-02-18 DIAGNOSIS — I10 Essential (primary) hypertension: Secondary | ICD-10-CM | POA: Insufficient documentation

## 2014-02-18 DIAGNOSIS — K5900 Constipation, unspecified: Secondary | ICD-10-CM | POA: Diagnosis not present

## 2014-02-18 DIAGNOSIS — N939 Abnormal uterine and vaginal bleeding, unspecified: Secondary | ICD-10-CM | POA: Diagnosis not present

## 2014-02-18 DIAGNOSIS — N390 Urinary tract infection, site not specified: Secondary | ICD-10-CM | POA: Diagnosis not present

## 2014-02-18 DIAGNOSIS — R110 Nausea: Secondary | ICD-10-CM

## 2014-02-18 DIAGNOSIS — R1030 Lower abdominal pain, unspecified: Secondary | ICD-10-CM

## 2014-02-18 LAB — URINE MICROSCOPIC-ADD ON

## 2014-02-18 LAB — COMPREHENSIVE METABOLIC PANEL
ALK PHOS: 59 U/L (ref 39–117)
ALT: 11 U/L (ref 0–35)
AST: 14 U/L (ref 0–37)
Albumin: 3.8 g/dL (ref 3.5–5.2)
Anion gap: 10 (ref 5–15)
BILIRUBIN TOTAL: 0.4 mg/dL (ref 0.3–1.2)
BUN: 5 mg/dL — AB (ref 6–23)
CHLORIDE: 107 meq/L (ref 96–112)
CO2: 24 meq/L (ref 19–32)
Calcium: 9 mg/dL (ref 8.4–10.5)
Creatinine, Ser: 0.52 mg/dL (ref 0.50–1.10)
GFR calc non Af Amer: >90 mL/min (ref >90)
GLUCOSE: 78 mg/dL (ref 70–99)
POTASSIUM: 3.5 meq/L — AB (ref 3.7–5.3)
Sodium: 141 mEq/L (ref 137–147)
Total Protein: 7.1 g/dL (ref 6.0–8.3)

## 2014-02-18 LAB — URINALYSIS, ROUTINE W REFLEX MICROSCOPIC
Bilirubin Urine: NEGATIVE
GLUCOSE, UA: NEGATIVE mg/dL
Ketones, ur: NEGATIVE mg/dL
Leukocytes, UA: NEGATIVE
Nitrite: POSITIVE — AB
PROTEIN: 100 mg/dL — AB
Specific Gravity, Urine: 1.025 (ref 1.005–1.030)
Urobilinogen, UA: 0.2 mg/dL (ref 0.0–1.0)
pH: 5 (ref 5.0–8.0)

## 2014-02-18 LAB — WET PREP, GENITAL
CLUE CELLS WET PREP: NONE SEEN
Trich, Wet Prep: NONE SEEN
Yeast Wet Prep HPF POC: NONE SEEN

## 2014-02-18 LAB — CBC
HCT: 38.1 % (ref 36.0–46.0)
Hemoglobin: 12.2 g/dL (ref 12.0–15.0)
MCH: 25.5 pg — ABNORMAL LOW (ref 26.0–34.0)
MCHC: 32 g/dL (ref 30.0–36.0)
MCV: 79.7 fL (ref 78.0–100.0)
Platelets: 253 10*3/uL (ref 150–400)
RBC: 4.78 MIL/uL (ref 3.87–5.11)
RDW: 12.9 % (ref 11.5–15.5)
WBC: 5.9 10*3/uL (ref 4.0–10.5)

## 2014-02-18 LAB — HCG, QUANTITATIVE, PREGNANCY

## 2014-02-18 LAB — LIPASE, BLOOD: LIPASE: 43 U/L (ref 11–59)

## 2014-02-18 LAB — POC URINE PREG, ED: Preg Test, Ur: NEGATIVE

## 2014-02-18 MED ORDER — NAPROXEN 500 MG PO TABS: 500.0000 mg | ORAL_TABLET | Freq: Two times a day (BID) | ORAL | Status: DC | PRN

## 2014-02-18 MED ORDER — CEFTRIAXONE SODIUM 250 MG IJ SOLR
250.0000 mg | Freq: Once | INTRAMUSCULAR | Status: AC
  Administered 2014-02-18: 250 mg via INTRAMUSCULAR
  Filled 2014-02-18: qty 250

## 2014-02-18 MED ORDER — LIDOCAINE HCL (PF) 1 % IJ SOLN
INTRAMUSCULAR | Status: AC
  Administered 2014-02-18: 5 mL
  Filled 2014-02-18: qty 5

## 2014-02-18 MED ORDER — ACETAMINOPHEN 325 MG PO TABS
650.0000 mg | ORAL_TABLET | Freq: Once | ORAL | Status: AC
  Administered 2014-02-18: 650 mg via ORAL
  Filled 2014-02-18: qty 2

## 2014-02-18 MED ORDER — AZITHROMYCIN 250 MG PO TABS
1000.0000 mg | ORAL_TABLET | Freq: Once | ORAL | Status: AC
  Administered 2014-02-18: 1000 mg via ORAL
  Filled 2014-02-18: qty 4

## 2014-02-18 MED ORDER — CEPHALEXIN 500 MG PO CAPS: ORAL_CAPSULE | ORAL | Status: DC

## 2014-02-18 MED ORDER — ONDANSETRON 4 MG PO TBDP: 4.0000 mg | ORAL_TABLET | Freq: Three times a day (TID) | ORAL | Status: DC | PRN

## 2014-02-18 MED ORDER — POLYETHYLENE GLYCOL 3350 17 G PO PACK
17.0000 g | PACK | Freq: Every day | ORAL | Status: DC
Start: 2014-02-18 — End: 2014-04-16

## 2014-02-18 MED ORDER — ONDANSETRON 4 MG PO TBDP
8.0000 mg | ORAL_TABLET | Freq: Once | ORAL | Status: AC
  Administered 2014-02-18: 8 mg via ORAL
  Filled 2014-02-18: qty 2

## 2014-02-18 NOTE — ED Provider Notes (Signed)
CSN: [id number]     Arrival date & time 02/18/14  1539 History   First MD Initiated Contact with Patient 02/18/14 1615     Chief Complaint  Patient presents with  . Abdominal Pain     (Consider location/radiation/quality/duration/timing/severity/associated sxs/prior Treatment) HPI Comments: Stephanie Mack is a 23 y.o. female with a PMHx of depression, bipolar, ADHD, anxiety, anemia, chlamydia, miscarriage at 6.26months, pseudocyesis, and pregnancy induced HTN, who presents to the ED with complaints of vaginal bleeding that started 3 hours ago. She noticed that when she wiped, there was bright red blood on toilet paper, and reports that it was scant. She states that she had none on the pinnae A that she was using. Denies any passage of clots or tissue. Associated symptoms include 9/10 pressure-like suprapubic pain which is constant and radiates into the right lower back, worse with sitting and bowel or bladder function, and without any known alleviating factors given that she has not tried any medications prior to arrival. She also endorses feeling distended/bloated, hematuria, and constipation for 4 days, as well as nausea. She denies any fevers, chills, chest pain, shortness of breath, vomiting, obstipation, diarrhea, hematochezia, melena, vaginal discharge, dysuria, malodorous urine, lightheadedness, dizziness, or fatigue. She denies any cauda equina symptoms. Denies any recent travel or suspicious food intake, denies any sick contacts. Last menstrual period was in September 2015, and she is unsure why she missed a period in October. She states that she has not taken any home pregnancy tests. She reports that her menses is typically regular. She is sexually active with one partner, unprotected.  Patient is a 23 y.o. female presenting with vaginal bleeding. The history is provided by the patient. No language interpreter was used.  Vaginal Bleeding Quality:  Bright red Severity:  Mild Onset quality:   Sudden Duration:  3 hours Timing:  Sporadic Progression:  Unchanged Menstrual history:  Regular Number of pads used:  0 Number of tampons used:  0 Possible pregnancy: unsure.   Context: after urination   Relieved by:  None tried Worsened by:  Nothing tried Ineffective treatments:  None tried Associated symptoms: abdominal pain (suprapubic), back pain (R lower back) and nausea   Associated symptoms: no dizziness, no dyspareunia, no dysuria, no fatigue, no fever and no vaginal discharge   Abdominal pain:    Location:  Suprapubic   Quality:  Pressure   Severity:  Moderate (9/10)   Onset quality:  Gradual   Duration:  3 hours   Timing:  Constant   Progression:  Unchanged   Chronicity:  New Risk factors: prior miscarriage and unprotected sex     Past Medical History  Diagnosis Date  . Depression     depression  . Bipolar disorder   . ADHD (attention deficit hyperactivity disorder)   . Anxiety   . Anemia   . ADHD (attention deficit hyperactivity disorder)   . Bipolar 1 disorder   . Chlamydia 05-31-10  . Pseudocyesis 2013    Seen in [facility] for percieved FM, abd distension. Normal exam.   . Gonorrhea contact, treated   . Pregnancy induced hypertension    Past Surgical History  Procedure Laterality Date  . Nasal septum surgery    . Cesarean section N/A 09/01/2012    Procedure:  Primary cesarean section with delivery of baby girl at 65.  Apgars 1/1.  ;  Surgeon: Frederico Hamman, MD;  Location: [facility];  Service: Obstetrics;  Laterality: N/A;   Family History  Problem Relation Age  of Onset  . Kidney disease Mother   . Hypertension Father   . Cancer Sister   . Asthma Brother   . Heart disease Neg Hx   . Diabetes Maternal Grandfather    History  Substance Use Topics  . Smoking status: Never Smoker   . Smokeless tobacco: Never Used  . Alcohol Use: No   OB History    Gravida Para Term Preterm AB TAB SAB Ectopic Multiple Living   2 1 0 1 1 0 1 0 0 0      Review of  Systems  Constitutional: Negative for fever, chills and fatigue.  Eyes: Negative for visual disturbance.  Respiratory: Negative for shortness of breath.   Cardiovascular: Negative for chest pain.  Gastrointestinal: Positive for nausea, abdominal pain (suprapubic), constipation and abdominal distention. Negative for vomiting, diarrhea, blood in stool, anal bleeding and rectal pain.  Genitourinary: Positive for hematuria, vaginal bleeding and menstrual problem. Negative for dysuria, urgency, frequency, flank pain, decreased urine volume, vaginal discharge, difficulty urinating, vaginal pain and dyspareunia.  Musculoskeletal: Positive for back pain (R lower back). Negative for myalgias and arthralgias.  Skin: Negative for color change.  Neurological: Negative for dizziness, syncope, weakness, light-headedness, numbness and headaches.  Psychiatric/Behavioral: Negative for confusion.   10 Systems reviewed and are negative for acute change except as noted in the HPI.    Allergies  Review of patient's allergies indicates no known allergies.  Home Medications   Prior to Admission medications   Medication Sig Start Date End Date Taking? Authorizing Provider  carvedilol (COREG) 6.25 MG tablet Take 1 tablet (6.25 mg total) by mouth 2 (two) times daily with a meal. 11/27/13  Yes Shelly Bombard, MD  triamterene-hydrochlorothiazide (DYAZIDE) 37.5-25 MG per capsule Take 1 each (1 capsule total) by mouth daily. 11/27/13  Yes Shelly Bombard, MD  azithromycin (ZITHROMAX) 250 MG tablet Take 4 tablets (1,000 mg total) by mouth once. 01/21/14   Shelly Bombard, MD  cefixime (SUPRAX) 400 MG tablet Take 1 tablet (400 mg total) by mouth daily. Patient not taking: Reported on 02/18/2014 01/21/14   Shelly Bombard, MD   BP 138/91 mmHg  Temp(Src) 97.8 F (36.6 C)  Resp 16  SpO2 100%  LMP 12/18/2013 Physical Exam  Constitutional: She is oriented to person, place, and time. Vital signs are normal. She  appears well-developed and well-nourished.  Non-toxic appearance. No distress.  Afebrile, nontoxic, NAD  HENT:  Head: Normocephalic and atraumatic.  Mouth/Throat: Oropharynx is clear and moist and mucous membranes are normal.  Eyes: Conjunctivae and EOM are normal. Right eye exhibits no discharge. Left eye exhibits no discharge.  Neck: Normal range of motion. Neck supple.  Cardiovascular: Normal rate, regular rhythm, normal heart sounds and intact distal pulses.  Exam reveals no gallop and no friction rub.   No murmur heard. Pulmonary/Chest: Effort normal and breath sounds normal. No respiratory distress. She has no decreased breath sounds. She has no wheezes. She has no rhonchi. She has no rales.  Abdominal: Soft. Normal appearance and bowel sounds are normal. She exhibits distension. There is tenderness in the suprapubic area. There is no rigidity, no rebound, no guarding, no CVA tenderness, no tenderness at McBurney's point and negative Murphy's sign.    Soft, mildly distended, +BS throughout, mildly TTP in suprapubic region and extending into RLQ near pelvic brim, no r/g/r, neg murphy's, neg mcburney's, no CVA TTP  Genitourinary: Pelvic exam was performed with patient supine. There is no rash, tenderness or lesion  on the right labia. There is no rash, tenderness or lesion on the left labia. Uterus is tender. Uterus is not deviated, not enlarged and not fixed. Cervix exhibits no motion tenderness, no discharge and no friability. Right adnexum displays no mass, no tenderness and no fullness. Left adnexum displays no mass, no tenderness and no fullness. There is bleeding in the vagina. No erythema or tenderness in the vagina. No foreign body around the vagina. No signs of injury around the vagina. No vaginal discharge found.  No rashes, lesions, or tenderness to external genitalia. No erythema, injury, or tenderness to vaginal mucosa. No vaginal discharge in vaginal vault, scant amount of darker  blood within vaginal vault, no clots or tissue. No adnexal masses, tenderness, or fullness. No CMT, cervical friability, or discharge from cervical os. Uterus non-deviated, mobile, without enlargement, and mildly TTP diffusely. Not palpable above pelvic brim, does not appear to be gravid  Musculoskeletal: Normal range of motion.  MAE x4, ambulatory without assistance, strength and sensation grossly intact. All spinal levels without TTP, with FROM intact.  Neurological: She is alert and oriented to person, place, and time. She has normal strength. No sensory deficit.  Skin: Skin is warm, dry and intact. No rash noted.  Psychiatric: She has a normal mood and affect.  Child-like affect  Nursing note and vitals reviewed.   ED Course  Procedures (including critical care time) Labs Review Labs Reviewed  WET PREP, GENITAL - Abnormal; Notable for the following:    WBC, Wet Prep HPF POC FEW (*)    All other components within normal limits  URINALYSIS, ROUTINE W REFLEX MICROSCOPIC - Abnormal; Notable for the following:    Color, Urine RED (*)    APPearance TURBID (*)    Hgb urine dipstick LARGE (*)    Protein, ur 100 (*)    Nitrite POSITIVE (*)    All other components within normal limits  CBC - Abnormal; Notable for the following:    MCH 25.5 (*)    All other components within normal limits  URINE MICROSCOPIC-ADD ON - Abnormal; Notable for the following:    Squamous Epithelial / LPF FEW (*)    Bacteria, UA MANY (*)    All other components within normal limits  COMPREHENSIVE METABOLIC PANEL - Abnormal; Notable for the following:    Potassium 3.5 (*)    BUN 5 (*)    All other components within normal limits  GC/CHLAMYDIA PROBE AMP  HCG, QUANTITATIVE, PREGNANCY  LIPASE, BLOOD  RPR  HIV ANTIBODY (ROUTINE TESTING)  POC URINE PREG, ED    Imaging Review US Transvaginal Non-ob  02/18/2014   CLINICAL DATA:  Vaginal bleeding and pelvic pain  EXAM: TRANSABDOMINAL AND TRANSVAGINAL ULTRASOUND  OF PELVIS  DOPPLER ULTRASOUND OF OVARIES  TECHNIQUE: Both transabdominal and transvaginal ultrasound examinations of the pelvis were performed. Transabdominal technique was performed for global imaging of the pelvis including uterus, ovaries, adnexal regions, and pelvic cul-de-sac.  It was necessary to proceed with endovaginal exam following the transabdominal exam to visualize the ovaries. Color and duplex Doppler ultrasound was utilized to evaluate blood flow to the ovaries.  COMPARISON:  None.  FINDINGS: Uterus  Measurements: 8.6 x 4.3 x 5.1 cm. Mild heterogeneity is noted in the region of the fundus which is indistinct and may represent some early myomatous change.  Endometrium  Thickness: 4.9 mm.  No focal abnormality visualized.  Right ovary  Measurements: 4.3 x 2.4 x 2.6 cm. A 2.2 cm mildly complex cystic  lesion is noted in the right ovary.  Left ovary  Measurements: 3.1 x 1.8 x 3.3 cm. Normal appearance/no adnexal mass.  Pulsed Doppler evaluation of both ovaries demonstrates normal low-resistance arterial and venous waveforms.  Other findings  Mild amount free pelvic fluid is noted.  IMPRESSION: Complex cystic lesion arising in the right ovary as described above. This may represent sequela from a ruptured cyst given the free fluid.  No evidence of ovarian torsion is noted.  Changes suggestive of early fibroid disease.   Electronically Signed   By: Inez Catalina M.D.   On: 02/18/2014 18:47   US Pelvis Complete  02/18/2014   CLINICAL DATA:  Vaginal bleeding and pelvic pain  EXAM: TRANSABDOMINAL AND TRANSVAGINAL ULTRASOUND OF PELVIS  DOPPLER ULTRASOUND OF OVARIES  TECHNIQUE: Both transabdominal and transvaginal ultrasound examinations of the pelvis were performed. Transabdominal technique was performed for global imaging of the pelvis including uterus, ovaries, adnexal regions, and pelvic cul-de-sac.  It was necessary to proceed with endovaginal exam following the transabdominal exam to visualize the  ovaries. Color and duplex Doppler ultrasound was utilized to evaluate blood flow to the ovaries.  COMPARISON:  None.  FINDINGS: Uterus  Measurements: 8.6 x 4.3 x 5.1 cm. Mild heterogeneity is noted in the region of the fundus which is indistinct and may represent some early myomatous change.  Endometrium  Thickness: 4.9 mm.  No focal abnormality visualized.  Right ovary  Measurements: 4.3 x 2.4 x 2.6 cm. A 2.2 cm mildly complex cystic lesion is noted in the right ovary.  Left ovary  Measurements: 3.1 x 1.8 x 3.3 cm. Normal appearance/no adnexal mass.  Pulsed Doppler evaluation of both ovaries demonstrates normal low-resistance arterial and venous waveforms.  Other findings  Mild amount free pelvic fluid is noted.  IMPRESSION: Complex cystic lesion arising in the right ovary as described above. This may represent sequela from a ruptured cyst given the free fluid.  No evidence of ovarian torsion is noted.  Changes suggestive of early fibroid disease.   Electronically Signed   By: Inez Catalina M.D.   On: 02/18/2014 18:47   [place] Art/ven Flow Abd Pelv Doppler  02/18/2014   CLINICAL DATA:  Vaginal bleeding and pelvic pain  EXAM: TRANSABDOMINAL AND TRANSVAGINAL ULTRASOUND OF PELVIS  DOPPLER ULTRASOUND OF OVARIES  TECHNIQUE: Both transabdominal and transvaginal ultrasound examinations of the pelvis were performed. Transabdominal technique was performed for global imaging of the pelvis including uterus, ovaries, adnexal regions, and pelvic cul-de-sac.  It was necessary to proceed with endovaginal exam following the transabdominal exam to visualize the ovaries. Color and duplex Doppler ultrasound was utilized to evaluate blood flow to the ovaries.  COMPARISON:  None.  FINDINGS: Uterus  Measurements: 8.6 x 4.3 x 5.1 cm. Mild heterogeneity is noted in the region of the fundus which is indistinct and may represent some early myomatous change.  Endometrium  Thickness: 4.9 mm.  No focal abnormality visualized.  Right ovary   Measurements: 4.3 x 2.4 x 2.6 cm. A 2.2 cm mildly complex cystic lesion is noted in the right ovary.  Left ovary  Measurements: 3.1 x 1.8 x 3.3 cm. Normal appearance/no adnexal mass.  Pulsed Doppler evaluation of both ovaries demonstrates normal low-resistance arterial and venous waveforms.  Other findings  Mild amount free pelvic fluid is noted.  IMPRESSION: Complex cystic lesion arising in the right ovary as described above. This may represent sequela from a ruptured cyst given the free fluid.  No evidence of ovarian torsion is  noted.  Changes suggestive of early fibroid disease.   Electronically Signed   By: Inez Catalina M.D.   On: 02/18/2014 18:47   Dg Abd Acute W/chest  02/18/2014   CLINICAL DATA:  Constipation for 2 weeks, possible perforation or obstruction  EXAM: ACUTE ABDOMEN SERIES (ABDOMEN 2 VIEW & CHEST 1 VIEW)  COMPARISON:  None.  FINDINGS: Heart size and mediastinal contours are within normal limits.  Lungs are clear. No effusion.  No free air. Small bowel decompressed. Moderate colonic and rectal fecal material in the RLQ and pelvis without dilatation.  There are no abnormal calcifications.  Regional bones unremarkable.  IMPRESSION: No acute cardiopulmonary disease.  Nonobstructive bowel gas pattern with moderate distal colonic and rectal fecal material.   Electronically Signed   By: Arne Cleveland M.D.   On: 02/18/2014 18:24     EKG Interpretation None      MDM   Final diagnoses:  Right flank pain  Vaginal bleeding  Lower abdominal pain  Constipation  Cyst of right ovary  Dysmenorrhea  UTI (lower urinary tract infection)  Nausea    23y/o female with vaginal bleeding and lower abd pain starting 3hrs ago, as well as constipation. Menses last month did not come, and she's usually regular. Vaginal exam reveals dark blood in vault, no clots or tissue, but tenderness diffusely throughout. Abdomen distended but soft and nonperitoneal. Upreg neg. Will proceed with obtaining labs,  ultrasound of pelvis, and AAS to eval for obstruction/perf/stool burden. Pt requesting tylenol for pain. Will give zofran for nausea. Will reassess shortly.  8:18 PM Pain slightly improved, nausea relieved. Tolerating PO with ease. Quant HCG <1. CBC WNL, CMP WNL aside from K 3.5, doubt need for repletion. Lipase WNL, U/A showing contamination but +nitrite and many bacteria, will tx for UTI. Wet prep with few WBC. Will empirically tx for GC/CT given tenderness and WBC. U/S showing ruptured R cyst which could be c/w recent ovulation, and would account for her lower abd pain. Also shows early fibroid disease. AAS showing moderate stool burden. Advised f/up with OBGYN for her ovarian cysts and menses concerns, and discussed use of miralax and water/fiber in diet to help with constipation. Rx for keflex given for UTI. Doubt nephrolithiasis or pyelo at this time. Will give rx for zofran. Discussed seeing her PCP in 1wk for re-check of ongoing symptoms. I explained the diagnosis and have given explicit precautions to return to the ER including for any other new or worsening symptoms. The patient understands and accepts the medical plan as it's been dictated and I have answered their questions. Discharge instructions concerning home care and prescriptions have been given. The patient is STABLE and is discharged to home in good condition.  BP 124/80 mmHg  Pulse 76  Temp(Src) 97.6 F (36.4 C) (Oral)  Resp 14  SpO2 100%  LMP 12/18/2013  Meds ordered this encounter  Medications  . ondansetron (ZOFRAN-ODT) disintegrating tablet 8 mg    Sig:   . acetaminophen (TYLENOL) tablet 650 mg    Sig:   . azithromycin (ZITHROMAX) tablet 1,000 mg    Sig:    And  . cefTRIAXone (ROCEPHIN) injection 250 mg    Sig:     Order Specific Question:  Antibiotic Indication:    Answer:  STD  . lidocaine (PF) (XYLOCAINE) 1 % injection    Sig:     Blain Pais   : cabinet override  . cephALEXin (KEFLEX) 500 MG capsule     Sig:  2 caps po bid x 7 days    Dispense:  28 capsule    Refill:  0    Order Specific Question:  Supervising Provider    Answer:  Noemi Chapel D [[id number]]  . naproxen (NAPROSYN) 500 MG tablet    Sig: Take 1 tablet (500 mg total) by mouth 2 (two) times daily as needed for mild pain, moderate pain or headache (TAKE WITH MEALS.).    Dispense:  20 tablet    Refill:  0    Order Specific Question:  Supervising Provider    Answer:  Noemi Chapel D [[id number]]  . ondansetron (ZOFRAN ODT) 4 MG disintegrating tablet    Sig: Take 1 tablet (4 mg total) by mouth every 8 (eight) hours as needed for nausea or vomiting.    Dispense:  15 tablet    Refill:  0    Order Specific Question:  Supervising Provider    Answer:  Noemi Chapel D [[id number]]  . polyethylene glycol (MIRALAX / GLYCOLAX) packet    Sig: Take 17 g by mouth daily. Use until you've had a soft bowel movement.    Dispense:  14 each    Refill:  0    Order Specific Question:  Supervising Provider    Answer:  Johnna Acosta [address] [place], PA-C 02/18/14 2028  Charlesetta Shanks, MD 02/19/14 [phone]

## 2014-02-18 NOTE — ED Notes (Addendum)
LAst menstrual period September, pt is unsure if she is pregnant, began having vaginal bleeding, vaginal pressure and lower abdominal pain. Abdomen is gravid,  Hx of miscarriage at 6.5 months with last pregnancy.  Pt has had 2 negative pregnancy tests here in 2 months. Unable to palpate fundus

## 2014-02-18 NOTE — ED Notes (Signed)
Pt in US

## 2014-02-18 NOTE — Discharge Instructions (Signed)
Stay very well hydrated with plenty of water throughout the day. Take antibiotic until completed. Use zofran as needed for nausea. Use naprosyn for pain related to your menstrual cycle and ovarian cyst. Use miralax as directed to help with your constipation, and increase the fiber and water in your diet to help with this as well. You were treated for gonorrhea and chlamydia, the lab will call you if those were positive. They will also call you if your syphilis or HIV testing is positive. Follow up with primary care physician in 1 week and with your OBGYN in 3-5 days for recheck of ongoing symptoms but return to ER for emergent changing or worsening of symptoms. Please seek immediate care if you develop the following: You develop back pain.  Your symptoms are no better, or worse in 3 days. There is severe back pain or lower abdominal pain.  You develop chills.  You have a fever.  There is nausea or vomiting.  There is continued burning or discomfort with urination.    Abdominal Pain Many things can cause belly (abdominal) pain. Most times, the belly pain is not dangerous. Many cases of belly pain can be watched and treated at home. HOME CARE   Do not take medicines that help you go poop (laxatives) unless told to by your doctor.  Only take medicine as told by your doctor.  Eat or drink as told by your doctor. Your doctor will tell you if you should be on a special diet. GET HELP IF:  You do not know what is causing your belly pain.  You have belly pain while you are sick to your stomach (nauseous) or have runny poop (diarrhea).  You have pain while you pee or poop.  Your belly pain wakes you up at night.  You have belly pain that gets worse or better when you eat.  You have belly pain that gets worse when you eat fatty foods.  You have a fever. GET HELP RIGHT AWAY IF:   The pain does not go away within 2 hours.  You keep throwing up (vomiting).  The pain changes and is only in  the right or left part of the belly.  You have bloody or tarry looking poop. MAKE SURE YOU:   Understand these instructions.  Will watch your condition.  Will get help right away if you are not doing well or get worse. Document Released: 09/07/2007 Document Revised: 03/26/2013 Document Reviewed: 11/28/2012 [facility] Patient Information 2015 [place], [place]. This information is not intended to replace advice given to you by your health care provider. Make sure you discuss any questions you have with your health care provider.  Constipation Constipation is when a person:  Poops (has a bowel movement) less than 3 times a week.  Has a hard time pooping.  Has poop that is dry, hard, or bigger than normal. HOME CARE   Eat foods with a lot of fiber in them. This includes fruits, vegetables, beans, and whole grains such as brown rice.  Avoid fatty foods and foods with a lot of sugar. This includes french fries, hamburgers, cookies, candy, and soda.  If you are not getting enough fiber from food, take products with added fiber in them (supplements).  Drink enough fluid to keep your pee (urine) clear or pale yellow.  Exercise on a regular basis, or as told by your doctor.  Go to the restroom when you feel like you need to poop. Do not hold it.  Only take medicine as told by your doctor. Do not take medicines that help you poop (laxatives) without talking to your doctor first. GET HELP RIGHT AWAY IF:   You have bright red blood in your poop (stool).  Your constipation lasts more than 4 days or gets worse.  You have belly (abdominal) or butt (rectal) pain.  You have thin poop (as thin as a pencil).  You lose weight, and it cannot be explained. MAKE SURE YOU:   Understand these instructions.  Will watch your condition.  Will get help right away if you are not doing well or get worse. Document Released: 09/07/2007 Document Revised: 03/26/2013 Document Reviewed:  12/31/2012 [facility] Patient Information 2015 [place], [place]. This information is not intended to replace advice given to you by your health care provider. Make sure you discuss any questions you have with your health care provider.  Dysmenorrhea Dysmenorrhea is pain during a menstrual period. You will have pain in the lower belly (abdomen). The pain is caused by the tightening (contracting) of the muscles of the uterus. The pain can be minor or severe. Headache, feeling sick to your stomach (nausea), throwing up (vomiting), or low back pain may occur with this condition. HOME CARE  Only take medicine as told by your doctor.  Place a heating pad or hot water bottle on your lower back or belly. Do not sleep with a heating pad.  Exercise may help lessen the pain.  Massage the lower back or belly.  Stop smoking.  Avoid alcohol and caffeine. GET HELP IF:   Your pain does not get better with medicine.  You have pain during sex.  Your pain gets worse while taking pain medicine.  Your period bleeding is heavier than normal.  You keep feeling sick to your stomach or keep throwing up. GET HELP RIGHT AWAY IF: You pass out (faint). Document Released: 06/17/2008 Document Revised: 03/26/2013 Document Reviewed: 09/06/2012 [facility] Patient Information 2015 [place], [place]. This information is not intended to replace advice given to you by your health care provider. Make sure you discuss any questions you have with your health care provider.  Nausea, Adult Nausea is the feeling that you have an upset stomach or have to vomit. Nausea by itself is not likely a serious concern, but it may be an early sign of more serious medical problems. As nausea gets worse, it can lead to vomiting. If vomiting develops, there is the risk of dehydration.  CAUSES   Viral infections.  Food poisoning.  Medicines.  Pregnancy.  Motion sickness.  Migraine headaches.  Emotional distress.  Severe pain from  any source.  Alcohol intoxication. HOME CARE INSTRUCTIONS  Get plenty of rest.  Ask your caregiver about specific rehydration instructions.  Eat small amounts of food and sip liquids more often.  Take all medicines as told by your caregiver. SEEK MEDICAL CARE IF:  You have not improved after 2 days, or you get worse.  You have a headache. SEEK IMMEDIATE MEDICAL CARE IF:   You have a fever.  You faint.  You keep vomiting or have blood in your vomit.  You are extremely weak or dehydrated.  You have dark or bloody stools.  You have severe chest or abdominal pain. MAKE SURE YOU:  Understand these instructions.  Will watch your condition.  Will get help right away if you are not doing well or get worse. Document Released: 04/28/2004 Document Revised: 12/14/2011 Document Reviewed: 12/01/2010 [facility] Patient Information 2015 [place], [place]. This information  is not intended to replace advice given to you by your health care provider. Make sure you discuss any questions you have with your health care provider.  Ovarian Cyst An ovarian cyst is a fluid-filled sac that forms on an ovary. The ovaries are small organs that produce eggs in women. Various types of cysts can form on the ovaries. Most are not cancerous. Many do not cause problems, and they often go away on their own. Some may cause symptoms and require treatment. Common types of ovarian cysts include:  Functional cysts--These cysts may occur every month during the menstrual cycle. This is normal. The cysts usually go away with the next menstrual cycle if the woman does not get pregnant. Usually, there are no symptoms with a functional cyst.  Endometrioma cysts--These cysts form from the tissue that lines the uterus. They are also called "chocolate cysts" because they become filled with blood that turns brown. This type of cyst can cause pain in the lower abdomen during intercourse and with your menstrual  period.  Cystadenoma cysts--This type develops from the cells on the outside of the ovary. These cysts can get very big and cause lower abdomen pain and pain with intercourse. This type of cyst can twist on itself, cut off its blood supply, and cause severe pain. It can also easily rupture and cause a lot of pain.  Dermoid cysts--This type of cyst is sometimes found in both ovaries. These cysts may contain different kinds of body tissue, such as skin, teeth, hair, or cartilage. They usually do not cause symptoms unless they get very big.  Theca lutein cysts--These cysts occur when too much of a certain hormone (human chorionic gonadotropin) is produced and overstimulates the ovaries to produce an egg. This is most common after procedures used to assist with the conception of a baby (in vitro fertilization). CAUSES   Fertility drugs can cause a condition in which multiple large cysts are formed on the ovaries. This is called ovarian hyperstimulation syndrome.  A condition called polycystic ovary syndrome can cause hormonal imbalances that can lead to nonfunctional ovarian cysts. SIGNS AND SYMPTOMS  Many ovarian cysts do not cause symptoms. If symptoms are present, they may include:  Pelvic pain or pressure.  Pain in the lower abdomen.  Pain during sexual intercourse.  Increasing girth (swelling) of the abdomen.  Abnormal menstrual periods.  Increasing pain with menstrual periods.  Stopping having menstrual periods without being pregnant. DIAGNOSIS  These cysts are commonly found during a routine or annual pelvic exam. Tests may be ordered to find out more about the cyst. These tests may include:  Ultrasound.  X-ray of the pelvis.  CT scan.  MRI.  Blood tests. TREATMENT  Many ovarian cysts go away on their own without treatment. Your health care provider may want to check your cyst regularly for 2-3 months to see if it changes. For women in menopause, it is particularly  important to monitor a cyst closely because of the higher rate of ovarian cancer in menopausal women. When treatment is needed, it may include any of the following:  A procedure to drain the cyst (aspiration). This may be done using a long needle and ultrasound. It can also be done through a laparoscopic procedure. This involves using a thin, lighted tube with a tiny camera on the end (laparoscope) inserted through a small incision.  Surgery to remove the whole cyst. This may be done using laparoscopic surgery or an open surgery involving a larger incision  in the lower abdomen.  Hormone treatment or birth control pills. These methods are sometimes used to help dissolve a cyst. HOME CARE INSTRUCTIONS   Only take over-the-counter or prescription medicines as directed by your health care provider.  Follow up with your health care provider as directed.  Get regular pelvic exams and Pap tests. SEEK MEDICAL CARE IF:   Your periods are late, irregular, or painful, or they stop.  Your pelvic pain or abdominal pain does not go away.  Your abdomen becomes larger or swollen.  You have pressure on your bladder or trouble emptying your bladder completely.  You have pain during sexual intercourse.  You have feelings of fullness, pressure, or discomfort in your stomach.  You lose weight for no apparent reason.  You feel generally ill.  You become constipated.  You lose your appetite.  You develop acne.  You have an increase in body and facial hair.  You are gaining weight, without changing your exercise and eating habits.  You think you are pregnant. SEEK IMMEDIATE MEDICAL CARE IF:   You have increasing abdominal pain.  You feel sick to your stomach (nauseous), and you throw up (vomit).  You develop a fever that comes on suddenly.  You have abdominal pain during a bowel movement.  Your menstrual periods become heavier than usual. MAKE SURE YOU:  Understand these  instructions.  Will watch your condition.  Will get help right away if you are not doing well or get worse. Document Released: 03/21/2005 Document Revised: 03/26/2013 Document Reviewed: 11/26/2012 [facility] Patient Information 2015 [place], [place]. This information is not intended to replace advice given to you by your health care provider. Make sure you discuss any questions you have with your health care provider.  Urinary Tract Infection A urinary tract infection (UTI) can occur any place along the urinary tract. The tract includes the kidneys, ureters, bladder, and urethra. A type of germ called bacteria often causes a UTI. UTIs are often helped with antibiotic medicine.  HOME CARE   If given, take antibiotics as told by your doctor. Finish them even if you start to feel better.  Drink enough fluids to keep your pee (urine) clear or pale yellow.  Avoid tea, drinks with caffeine, and bubbly (carbonated) drinks.  Pee often. Avoid holding your pee in for a long time.  Pee before and after having sex (intercourse).  Wipe from front to back after you poop (bowel movement) if you are a woman. Use each tissue only once. GET HELP RIGHT AWAY IF:   You have back pain.  You have lower belly (abdominal) pain.  You have chills.  You feel sick to your stomach (nauseous).  You throw up (vomit).  Your burning or discomfort with peeing does not go away.  You have a fever.  Your symptoms are not better in 3 days. MAKE SURE YOU:   Understand these instructions.  Will watch your condition.  Will get help right away if you are not doing well or get worse. Document Released: 09/07/2007 Document Revised: 12/14/2011 Document Reviewed: 10/20/2011 [facility] Patient Information 2015 [place], [place]. This information is not intended to replace advice given to you by your health care provider. Make sure you discuss any questions you have with your health care provider.

## 2014-02-19 LAB — RPR

## 2014-02-19 LAB — HIV ANTIBODY (ROUTINE TESTING W REFLEX): HIV 1&2 Ab, 4th Generation: NONREACTIVE

## 2014-02-19 LAB — GC/CHLAMYDIA PROBE AMP
CT PROBE, AMP APTIMA: NEGATIVE
GC PROBE AMP APTIMA: NEGATIVE

## 2014-02-21 ENCOUNTER — Ambulatory Visit (INDEPENDENT_AMBULATORY_CARE_PROVIDER_SITE_OTHER): Payer: Medicaid Other | Admitting: Obstetrics

## 2014-02-21 VITALS — BP 132/87 | HR 89 | Temp 98.3°F | Wt 110.0 lb

## 2014-02-21 DIAGNOSIS — Z3202 Encounter for pregnancy test, result negative: Secondary | ICD-10-CM

## 2014-02-21 DIAGNOSIS — Z30011 Encounter for initial prescription of contraceptive pills: Secondary | ICD-10-CM

## 2014-02-21 DIAGNOSIS — N939 Abnormal uterine and vaginal bleeding, unspecified: Secondary | ICD-10-CM

## 2014-02-21 LAB — POCT URINE PREGNANCY: Preg Test, Ur: NEGATIVE

## 2014-02-21 MED ORDER — LEVONORGESTREL-ETHINYL ESTRAD 0.15-30 MG-MCG PO TABS: 1.0000 | ORAL_TABLET | Freq: Every day | ORAL | Status: DC

## 2014-02-21 NOTE — Addendum Note (Signed)
Addended by: Ladona Ridgel on: 02/21/2014 01:48 PM   Modules accepted: Orders

## 2014-02-21 NOTE — Progress Notes (Signed)
Patient ID: Stephanie Mack, female   DOB: [birth year], 23 y.o.   MRN: [id number]  Chief Complaint  Patient presents with  . Follow-up    HPI Stephanie Mack is a 23 y.o. female.  Irregular vaginal bleeding.  HPI  Past Medical History  Diagnosis Date  . Depression     depression  . Bipolar disorder   . ADHD (attention deficit hyperactivity disorder)   . Anxiety   . Anemia   . ADHD (attention deficit hyperactivity disorder)   . Bipolar 1 disorder   . Chlamydia 05-31-10  . Pseudocyesis 2013    Seen in [facility] for percieved FM, abd distension. Normal exam.   . Gonorrhea contact, treated   . Pregnancy induced hypertension     Past Surgical History  Procedure Laterality Date  . Nasal septum surgery    . Cesarean section N/A 09/01/2012    Procedure:  Primary cesarean section with delivery of baby girl at 80.  Apgars 1/1.  ;  Surgeon: Frederico Hamman, MD;  Location: [facility];  Service: Obstetrics;  Laterality: N/A;    Family History  Problem Relation Age of Onset  . Kidney disease Mother   . Hypertension Father   . Cancer Sister   . Asthma Brother   . Heart disease Neg Hx   . Diabetes Maternal Grandfather     Social History History  Substance Use Topics  . Smoking status: Never Smoker   . Smokeless tobacco: Never Used  . Alcohol Use: No    No Known Allergies  Current Outpatient Prescriptions  Medication Sig Dispense Refill  . carvedilol (COREG) 6.25 MG tablet Take 1 tablet (6.25 mg total) by mouth 2 (two) times daily with a meal. 60 tablet 11  . triamterene-hydrochlorothiazide (DYAZIDE) 37.5-25 MG per capsule Take 1 each (1 capsule total) by mouth daily. 30 capsule 11  . azithromycin (ZITHROMAX) 250 MG tablet Take 4 tablets (1,000 mg total) by mouth once. 4 tablet 0  . cefixime (SUPRAX) 400 MG tablet Take 1 tablet (400 mg total) by mouth daily. (Patient not taking: Reported on 02/18/2014) 1 tablet 0  . cephALEXin (KEFLEX) 500 MG capsule 2 caps po bid x 7 days 28 capsule  0  . levonorgestrel-ethinyl estradiol (NORDETTE) 0.15-30 MG-MCG tablet Take 1 tablet by mouth daily. 1 Package 11  . naproxen (NAPROSYN) 500 MG tablet Take 1 tablet (500 mg total) by mouth 2 (two) times daily as needed for mild pain, moderate pain or headache (TAKE WITH MEALS.). 20 tablet 0  . ondansetron (ZOFRAN ODT) 4 MG disintegrating tablet Take 1 tablet (4 mg total) by mouth every 8 (eight) hours as needed for nausea or vomiting. 15 tablet 0  . polyethylene glycol (MIRALAX / GLYCOLAX) packet Take 17 g by mouth daily. Use until you've had a soft bowel movement. 14 each 0   Current Facility-Administered Medications  Medication Dose Route Frequency Provider Last Rate Last Dose  . medroxyPROGESTERone (DEPO-PROVERA) injection 150 mg  150 mg Intramuscular Q90 days Shelly Bombard, MD   150 mg at 03/14/13 1033    Review of Systems Review of Systems Constitutional: negative for fatigue and weight loss Respiratory: negative for cough and wheezing Cardiovascular: negative for chest pain, fatigue and palpitations Gastrointestinal: negative for abdominal pain and change in bowel habits Genitourinary:negative Integument/breast: negative for nipple discharge Musculoskeletal:negative for myalgias Neurological: negative for gait problems and tremors Behavioral/Psych: negative for abusive relationship, depression Endocrine: negative for temperature intolerance     Blood pressure 132/87, pulse  89, temperature 98.3 F (36.8 C), weight 110 lb (49.896 kg), last menstrual period 12/18/2013.  Physical Exam Physical Exam General:   alert  Skin:   no rash or abnormalities  Lungs:   clear to auscultation bilaterally  Heart:   regular rate and rhythm, S1, S2 normal, no murmur, click, rub or gallop  Breasts:   normal without suspicious masses, skin or nipple changes or axillary nodes  Abdomen:  normal findings: no organomegaly, soft, non-tender and no hernia  Pelvis:  External genitalia: normal general  appearance Urinary system: urethral meatus normal and bladder without fullness, nontender Vaginal: normal without tenderness, induration or masses Cervix: normal appearance Adnexa: normal bimanual exam Uterus: anteverted and non-tender, normal size      Data Reviewed labs  Assessment    AUB Contraceptive counseling     Plan    Nordette 28 Rx F/U 4 months.  No orders of the defined types were placed in this encounter.   Meds ordered this encounter  Medications  . levonorgestrel-ethinyl estradiol (NORDETTE) 0.15-30 MG-MCG tablet    Sig: Take 1 tablet by mouth daily.    Dispense:  1 Package    Refill:  11       HARPER,CHARLES A 02/21/2014, 1:46 PM

## 2014-02-25 ENCOUNTER — Ambulatory Visit: Payer: Medicaid Other | Admitting: Obstetrics

## 2014-03-12 ENCOUNTER — Telehealth: Payer: Self-pay | Admitting: *Deleted

## 2014-03-12 NOTE — Telephone Encounter (Signed)
Patient called with complaints of not feeling well. Patient states for the last week she has been having some discomfort in her abdomen and headaches. Patient states she started the birth control prescribed for her but stopped taking it "because it was making her sick." Patient encourage to be seen at urgent care.

## 2014-03-19 ENCOUNTER — Encounter (HOSPITAL_COMMUNITY): Payer: Self-pay | Admitting: *Deleted

## 2014-03-19 ENCOUNTER — Inpatient Hospital Stay (HOSPITAL_COMMUNITY)
Admission: AD | Admit: 2014-03-19 | Discharge: 2014-03-19 | Disposition: A | Discharge status: Home / Self Care | Payer: Medicaid Other | Source: Ambulatory Visit | Attending: Obstetrics & Gynecology | Admitting: Obstetrics & Gynecology

## 2014-03-19 DIAGNOSIS — N390 Urinary tract infection, site not specified: Secondary | ICD-10-CM | POA: Insufficient documentation

## 2014-03-19 DIAGNOSIS — R1030 Lower abdominal pain, unspecified: Secondary | ICD-10-CM | POA: Diagnosis present

## 2014-03-19 DIAGNOSIS — N939 Abnormal uterine and vaginal bleeding, unspecified: Secondary | ICD-10-CM | POA: Diagnosis not present

## 2014-03-19 LAB — WET PREP, GENITAL
CLUE CELLS WET PREP: NONE SEEN
Trich, Wet Prep: NONE SEEN
YEAST WET PREP: NONE SEEN

## 2014-03-19 LAB — URINALYSIS, ROUTINE W REFLEX MICROSCOPIC
Bilirubin Urine: NEGATIVE
GLUCOSE, UA: 100 mg/dL — AB
KETONES UR: 15 mg/dL — AB
Nitrite: POSITIVE — AB
Protein, ur: 100 mg/dL — AB
Specific Gravity, Urine: 1.025 (ref 1.005–1.030)
UROBILINOGEN UA: 1 mg/dL (ref 0.0–1.0)
pH: 5 (ref 5.0–8.0)

## 2014-03-19 LAB — POCT PREGNANCY, URINE: PREG TEST UR: NEGATIVE

## 2014-03-19 LAB — URINE MICROSCOPIC-ADD ON

## 2014-03-19 MED ORDER — CIPROFLOXACIN HCL 500 MG PO TABS: 500.0000 mg | ORAL_TABLET | Freq: Two times a day (BID) | ORAL | Status: DC

## 2014-03-19 MED ORDER — KETOROLAC TROMETHAMINE 60 MG/2ML IM SOLN
60.0000 mg | Freq: Once | INTRAMUSCULAR | Status: AC
  Administered 2014-03-19: 60 mg via INTRAMUSCULAR
  Filled 2014-03-19: qty 2

## 2014-03-19 NOTE — MAU Provider Note (Signed)
History     CSN: [id number]  Arrival date and time: 03/19/14 1448   First Provider Initiated Contact with Patient 03/19/14 1616      Chief Complaint  Patient presents with  . Abdominal Pain  . Vaginal Bleeding   HPI Comments: Stephanie Mack 23 y.o. [id number] presents to MAU with lower abdominal pain that started today at 1pm. She took no medications. She has vaginal bleeding but does not think its her menstrual cycle. She was given BCP but stopped taking them after one dose because they made her sick. By ultrasound she has history of complex right ovarian cyst in November. She has an appointment with Dr Jodi Mourning tomorrow.  She is a poor historian.   Abdominal Pain  Vaginal Bleeding Associated symptoms include abdominal pain.      Past Medical History  Diagnosis Date  . Depression     depression  . Bipolar disorder   . ADHD (attention deficit hyperactivity disorder)   . Anxiety   . Anemia   . ADHD (attention deficit hyperactivity disorder)   . Bipolar 1 disorder   . Chlamydia 05-31-10  . Pseudocyesis 2013    Seen in MAU for percieved FM, abd distension. Normal exam.   . Gonorrhea contact, treated   . Pregnancy induced hypertension     Past Surgical History  Procedure Laterality Date  . Nasal septum surgery    . Cesarean section N/A 09/01/2012    Procedure:  Primary cesarean section with delivery of baby girl at 78.  Apgars 1/1.  ;  Surgeon: Frederico Hamman, MD;  Location: [facility];  Service: Obstetrics;  Laterality: N/A;    Family History  Problem Relation Age of Onset  . Kidney disease Mother   . Hypertension Father   . Cancer Sister   . Asthma Brother   . Heart disease Neg Hx   . Diabetes Maternal Grandfather     History  Substance Use Topics  . Smoking status: Never Smoker   . Smokeless tobacco: Never Used  . Alcohol Use: No    Allergies: No Known Allergies  Prescriptions prior to admission  Medication Sig Dispense Refill Last Dose  .  azithromycin (ZITHROMAX) 250 MG tablet Take 4 tablets (1,000 mg total) by mouth once. 4 tablet 0 Completed Course at Unknown time  . carvedilol (COREG) 6.25 MG tablet Take 1 tablet (6.25 mg total) by mouth 2 (two) times daily with a meal. 60 tablet 11 Taking  . cefixime (SUPRAX) 400 MG tablet Take 1 tablet (400 mg total) by mouth daily. (Patient not taking: Reported on 02/18/2014) 1 tablet 0   . cephALEXin (KEFLEX) 500 MG capsule 2 caps po bid x 7 days 28 capsule 0   . levonorgestrel-ethinyl estradiol (NORDETTE) 0.15-30 MG-MCG tablet Take 1 tablet by mouth daily. 1 Package 11   . naproxen (NAPROSYN) 500 MG tablet Take 1 tablet (500 mg total) by mouth 2 (two) times daily as needed for mild pain, moderate pain or headache (TAKE WITH MEALS.). 20 tablet 0   . ondansetron (ZOFRAN ODT) 4 MG disintegrating tablet Take 1 tablet (4 mg total) by mouth every 8 (eight) hours as needed for nausea or vomiting. 15 tablet 0   . polyethylene glycol (MIRALAX / GLYCOLAX) packet Take 17 g by mouth daily. Use until you've had a soft bowel movement. 14 each 0   . triamterene-hydrochlorothiazide (DYAZIDE) 37.5-25 MG per capsule Take 1 each (1 capsule total) by mouth daily. 30 capsule 11 Taking  Review of Systems  Constitutional: Negative.   HENT: Negative.   Respiratory: Negative.   Cardiovascular: Negative.   Gastrointestinal: Positive for abdominal pain.  Genitourinary: Negative.   Musculoskeletal: Negative.   Skin: Negative.   Neurological: Negative.   Psychiatric/Behavioral: The patient is nervous/anxious.    Physical Exam   Blood pressure 140/86, pulse 103, temperature 98 F (36.7 C), temperature source Oral, resp. rate 18, height 5\' 1"  (1.549 m), weight 52.889 kg (116 lb 9.6 oz), last menstrual period 02/03/2014.  Physical Exam  Constitutional: She is oriented to person, place, and time. She appears well-developed and well-nourished. No distress.  HENT:  Head: Normocephalic and atraumatic.  GI:  Soft. There is tenderness.  Genitourinary:  Genital:external negative Vaginal: moderate amount blood Cervix: closed/ thick Bimanual: tender left lower quad   Musculoskeletal: Normal range of motion.  Neurological: She is alert and oriented to person, place, and time.  Skin: Skin is warm.  Psychiatric: Her speech is tangential. She is slowed. She exhibits abnormal recent memory and abnormal remote memory.   Results for orders placed or performed during the hospital encounter of 03/19/14 (from the past 24 hour(s))  Urinalysis, Routine w reflex microscopic     Status: Abnormal   Collection Time: 03/19/14  2:55 PM  Result Value Ref Range   Color, Urine RED (A) YELLOW   APPearance CLOUDY (A) CLEAR   Specific Gravity, Urine 1.025 1.005 - 1.030   pH 5.0 5.0 - 8.0   Glucose, UA 100 (A) NEGATIVE mg/dL   Hgb urine dipstick LARGE (A) NEGATIVE   Bilirubin Urine NEGATIVE NEGATIVE   Ketones, ur 15 (A) NEGATIVE mg/dL   Protein, ur 100 (A) NEGATIVE mg/dL   Urobilinogen, UA 1.0 0.0 - 1.0 mg/dL   Nitrite POSITIVE (A) NEGATIVE   Leukocytes, UA TRACE (A) NEGATIVE  Urine microscopic-add on     Status: Abnormal   Collection Time: 03/19/14  2:55 PM  Result Value Ref Range   Squamous Epithelial / LPF FEW (A) RARE   WBC, UA 0-2 <3 WBC/hpf   RBC / HPF TOO NUMEROUS TO COUNT <3 RBC/hpf  Pregnancy, urine POC     Status: None   Collection Time: 03/19/14  3:06 PM  Result Value Ref Range   Preg Test, Ur NEGATIVE NEGATIVE  Wet prep, genital     Status: Abnormal   Collection Time: 03/19/14  5:00 PM  Result Value Ref Range   Yeast Wet Prep HPF POC NONE SEEN NONE SEEN   Trich, Wet Prep NONE SEEN NONE SEEN   Clue Cells Wet Prep HPF POC NONE SEEN NONE SEEN   WBC, Wet Prep HPF POC FEW (A) NONE SEEN    MAU Course  Procedures  MDM  Toradol 60 mg IM Wet prep/ gc/chlamydia  Assessment and Plan   A: UTI  P: Cipro 500 mg PO BID x 3 days Increase fluids/ rest Follow up with Dr Jodi Mourning Return to MAU as  needed  Georgia Duff 03/19/2014, 4:35 PM

## 2014-03-19 NOTE — Discharge Instructions (Signed)

## 2014-03-19 NOTE — MAU Note (Signed)
Pt presents complaining of pain in her lower abdomen. States it started at 1300 and has not taken any pain medication. Pt states she is also having vaginal bleeding that she does not believe is her period. LMP sometime in November

## 2014-03-20 ENCOUNTER — Encounter: Payer: Self-pay | Admitting: Obstetrics

## 2014-03-20 ENCOUNTER — Ambulatory Visit (INDEPENDENT_AMBULATORY_CARE_PROVIDER_SITE_OTHER): Payer: Medicaid Other | Admitting: Obstetrics

## 2014-03-20 VITALS — BP 121/79 | HR 114 | Temp 98.1°F | Ht 61.0 in | Wt 116.0 lb

## 2014-03-20 DIAGNOSIS — N939 Abnormal uterine and vaginal bleeding, unspecified: Secondary | ICD-10-CM

## 2014-03-20 DIAGNOSIS — N390 Urinary tract infection, site not specified: Secondary | ICD-10-CM

## 2014-03-20 DIAGNOSIS — N644 Mastodynia: Secondary | ICD-10-CM

## 2014-03-20 LAB — GC/CHLAMYDIA PROBE AMP
CT PROBE, AMP APTIMA: NEGATIVE
GC Probe RNA: NEGATIVE

## 2014-03-20 MED ORDER — CEFIXIME 400 MG PO TABS: 400.0000 mg | ORAL_TABLET | Freq: Every day | ORAL | Status: DC

## 2014-03-20 NOTE — Progress Notes (Signed)
Patient ID: Stephanie Mack, female   DOB: March 24, 1991, 23 y.o.   MRN: [id number]  Chief Complaint  Patient presents with  . Problem    stomach and breast pain     HPI Sumedha Munnerlyn is a 23 y.o. female.  Urinary frequency and stabbing lower abdominal pain.  Breast pain.  HPI  Past Medical History  Diagnosis Date  . Depression     depression  . Bipolar disorder   . ADHD (attention deficit hyperactivity disorder)   . Anxiety   . Anemia   . ADHD (attention deficit hyperactivity disorder)   . Bipolar 1 disorder   . Chlamydia 05-31-10  . Pseudocyesis 2013    Seen in MAU for percieved FM, abd distension. Normal exam.   . Gonorrhea contact, treated   . Pregnancy induced hypertension     Past Surgical History  Procedure Laterality Date  . Nasal septum surgery    . Cesarean section N/A 09/01/2012    Procedure:  Primary cesarean section with delivery of baby girl at 82.  Apgars 1/1.  ;  Surgeon: Frederico Hamman, MD;  Location: [facility];  Service: Obstetrics;  Laterality: N/A;    Family History  Problem Relation Age of Onset  . Kidney disease Mother   . Hypertension Father   . Cancer Sister   . Asthma Brother   . Heart disease Neg Hx   . Diabetes Maternal Grandfather     Social History History  Substance Use Topics  . Smoking status: Never Smoker   . Smokeless tobacco: Never Used  . Alcohol Use: No    No Known Allergies  Current Outpatient Prescriptions  Medication Sig Dispense Refill  . amLODipine (NORVASC) 5 MG tablet Take 5 mg by mouth daily. Patient takes Amlodipine 5 mg tablet at bedtime.    . ciprofloxacin (CIPRO) 500 MG tablet Take 1 tablet (500 mg total) by mouth 2 (two) times daily. 6 tablet 0  . naproxen (NAPROSYN) 500 MG tablet Take 1 tablet (500 mg total) by mouth 2 (two) times daily as needed for mild pain, moderate pain or headache (TAKE WITH MEALS.). 20 tablet 0  . ondansetron (ZOFRAN ODT) 4 MG disintegrating tablet Take 1 tablet (4 mg total) by mouth  every 8 (eight) hours as needed for nausea or vomiting. 15 tablet 0  . triamterene-hydrochlorothiazide (DYAZIDE) 37.5-25 MG per capsule Take 1 each (1 capsule total) by mouth daily. 30 capsule 11  . cefixime (SUPRAX) 400 MG tablet Take 1 tablet (400 mg total) by mouth daily. 7 tablet 0  . polyethylene glycol (MIRALAX / GLYCOLAX) packet Take 17 g by mouth daily. Use until you've had a soft bowel movement. (Patient not taking: Reported on 03/19/2014) 14 each 0   No current facility-administered medications for this visit.    Review of Systems Review of Systems Constitutional: negative for fatigue and weight loss Respiratory: negative for cough and wheezing Cardiovascular: negative for chest pain, fatigue and palpitations Gastrointestinal: negative for abdominal pain and change in bowel habits Genitourinary: urinary frequency and dysuria Integument/breast: negative for nipple discharge.  Positive for breast tenderness. Musculoskeletal:negative for myalgias Neurological: negative for gait problems and tremors Behavioral/Psych: negative for abusive relationship, depression Endocrine: negative for temperature intolerance     Blood pressure 121/79, pulse 114, temperature 98.1 F (36.7 C), height 5\' 1"  (1.549 m), weight 116 lb (52.617 kg), last menstrual period 02/03/2014.  Physical Exam Physical Exam General:   alert  Skin:   no rash or abnormalities  Lungs:  clear to auscultation bilaterally  Heart:   regular rate and rhythm, S1, S2 normal, no murmur, click, rub or gallop  Breasts:   normal without suspicious masses, skin or nipple changes or axillary nodes  Abdomen:  positive suprapubic tenderness         Data Reviewed Labs  Assessment    UTI  Mastodynia AUB    Plan    Suprax Rx Will follow breast pain.  Orders Placed This Encounter  Procedures  . Urine Culture  . GC/Chlamydia Probe Amp   Meds ordered this encounter  Medications  . cefixime (SUPRAX) 400 MG tablet     Sig: Take 1 tablet (400 mg total) by mouth daily.    Dispense:  7 tablet    Refill:  0      HARPER,CHARLES A 03/20/2014, 8:26 PM

## 2014-03-21 LAB — URINE CULTURE: Special Requests: NORMAL

## 2014-03-21 LAB — GC/CHLAMYDIA PROBE AMP
CT Probe RNA: NEGATIVE
GC PROBE AMP APTIMA: NEGATIVE

## 2014-03-22 LAB — URINE CULTURE: Colony Count: 25000

## 2014-03-31 ENCOUNTER — Encounter: Payer: Self-pay | Admitting: *Deleted

## 2014-04-01 ENCOUNTER — Encounter: Payer: Self-pay | Admitting: Obstetrics & Gynecology

## 2014-04-10 ENCOUNTER — Telehealth: Payer: Self-pay | Admitting: Obstetrics

## 2014-04-11 ENCOUNTER — Other Ambulatory Visit: Payer: Medicaid Other

## 2014-04-11 NOTE — Telephone Encounter (Signed)
[id number] - encounter closed

## 2014-04-16 ENCOUNTER — Inpatient Hospital Stay (HOSPITAL_COMMUNITY)
Admission: AD | Admit: 2014-04-16 | Discharge: 2014-04-16 | Disposition: A | Discharge status: Home / Self Care | Payer: Medicaid Other | Source: Ambulatory Visit | Attending: Obstetrics | Admitting: Obstetrics

## 2014-04-16 ENCOUNTER — Encounter (HOSPITAL_COMMUNITY): Payer: Self-pay | Admitting: *Deleted

## 2014-04-16 ENCOUNTER — Other Ambulatory Visit: Payer: Medicaid Other

## 2014-04-16 DIAGNOSIS — R110 Nausea: Secondary | ICD-10-CM

## 2014-04-16 DIAGNOSIS — Z3202 Encounter for pregnancy test, result negative: Secondary | ICD-10-CM | POA: Diagnosis not present

## 2014-04-16 DIAGNOSIS — R109 Unspecified abdominal pain: Secondary | ICD-10-CM | POA: Insufficient documentation

## 2014-04-16 LAB — URINALYSIS, ROUTINE W REFLEX MICROSCOPIC
Bilirubin Urine: NEGATIVE
GLUCOSE, UA: NEGATIVE mg/dL
HGB URINE DIPSTICK: NEGATIVE
KETONES UR: NEGATIVE mg/dL
LEUKOCYTES UA: NEGATIVE
Nitrite: NEGATIVE
PH: 6 (ref 5.0–8.0)
Protein, ur: NEGATIVE mg/dL
Specific Gravity, Urine: >1.03 — ABNORMAL HIGH (ref 1.005–1.030)
Urobilinogen, UA: 0.2 mg/dL (ref 0.0–1.0)

## 2014-04-16 LAB — WET PREP, GENITAL
Trich, Wet Prep: NONE SEEN
YEAST WET PREP: NONE SEEN

## 2014-04-16 LAB — POCT PREGNANCY, URINE: Preg Test, Ur: NEGATIVE

## 2014-04-16 MED ORDER — PROMETHAZINE HCL 25 MG PO TABS: 25.0000 mg | ORAL_TABLET | Freq: Four times a day (QID) | ORAL | Status: DC | PRN

## 2014-04-16 MED ORDER — PROMETHAZINE HCL 25 MG PO TABS
25.0000 mg | ORAL_TABLET | Freq: Once | ORAL | Status: AC
  Administered 2014-04-16: 25 mg via ORAL
  Filled 2014-04-16: qty 1

## 2014-04-16 NOTE — MAU Provider Note (Signed)
History     CSN: [id number]  Arrival date and time: 04/16/14 1457   First Provider Initiated Contact with Patient 04/16/14 1612      Chief Complaint  Patient presents with  . Abdominal Cramping   HPI Comments: Stephanie Mack 24 y.o. [id number] presents to the MAU stating that she had abdominal cramping starting at midnight last night. She has been recently diagnosed with a UTI and has finished antibiotics a few days ago.     Abdominal Cramping Associated symptoms include nausea.      Past Medical History  Diagnosis Date  . Depression     depression  . Bipolar disorder   . ADHD (attention deficit hyperactivity disorder)   . Anxiety   . Anemia   . ADHD (attention deficit hyperactivity disorder)   . Bipolar 1 disorder   . Chlamydia 05-31-10  . Pseudocyesis 2013    Seen in MAU for percieved FM, abd distension. Normal exam.   . Gonorrhea contact, treated   . Pregnancy induced hypertension     Past Surgical History  Procedure Laterality Date  . Nasal septum surgery    . Cesarean section N/A 09/01/2012    Procedure:  Primary cesarean section with delivery of baby girl at 38.  Apgars 1/1.  ;  Surgeon: Frederico Hamman, MD;  Location: [facility];  Service: Obstetrics;  Laterality: N/A;    Family History  Problem Relation Age of Onset  . Kidney disease Mother   . Hypertension Father   . Cancer Sister   . Asthma Brother   . Heart disease Neg Hx   . Diabetes Maternal Grandfather     History  Substance Use Topics  . Smoking status: Never Smoker   . Smokeless tobacco: Never Used  . Alcohol Use: No    Allergies: No Known Allergies  Prescriptions prior to admission  Medication Sig Dispense Refill Last Dose  . amLODipine (NORVASC) 5 MG tablet Take 5 mg by mouth daily. Patient takes Amlodipine 5 mg tablet at bedtime.   Taking  . cefixime (SUPRAX) 400 MG tablet Take 1 tablet (400 mg total) by mouth daily. 7 tablet 0   . ciprofloxacin (CIPRO) 500 MG tablet Take 1  tablet (500 mg total) by mouth 2 (two) times daily. 6 tablet 0 Taking  . naproxen (NAPROSYN) 500 MG tablet Take 1 tablet (500 mg total) by mouth 2 (two) times daily as needed for mild pain, moderate pain or headache (TAKE WITH MEALS.). 20 tablet 0 Taking  . ondansetron (ZOFRAN ODT) 4 MG disintegrating tablet Take 1 tablet (4 mg total) by mouth every 8 (eight) hours as needed for nausea or vomiting. 15 tablet 0 Taking  . polyethylene glycol (MIRALAX / GLYCOLAX) packet Take 17 g by mouth daily. Use until you've had a soft bowel movement. (Patient not taking: Reported on 03/19/2014) 14 each 0 Not Taking  . triamterene-hydrochlorothiazide (DYAZIDE) 37.5-25 MG per capsule Take 1 each (1 capsule total) by mouth daily. 30 capsule 11 Taking    Review of Systems  Constitutional: Negative.   HENT: Negative.   Eyes: Negative.   Respiratory: Negative.   Cardiovascular: Negative.   Gastrointestinal: Positive for nausea and abdominal pain.       Abdominal cramping with diarrhea  Genitourinary: Negative.   Musculoskeletal: Negative.   Skin: Negative.   Neurological: Negative.   Endo/Heme/Allergies: Negative.   Psychiatric/Behavioral: Negative.    Physical Exam   Blood pressure 129/87, pulse 104, temperature 97.7 F (36.5 C), temperature source  Oral, resp. rate 18, height 5' (1.524 m), weight 54.885 kg (121 lb), last menstrual period 03/18/2014.  Physical Exam  Constitutional: She is oriented to person, place, and time. She appears well-developed and well-nourished.  HENT:  Head: Normocephalic.  Cardiovascular: Normal rate, regular rhythm and normal heart sounds.   Respiratory: Effort normal. No respiratory distress. She has no wheezes. She has no rales. She exhibits no tenderness.  GI: Soft. Bowel sounds are normal. She exhibits no distension and no mass. There is no tenderness. There is no rebound and no guarding.  Genitourinary: Vagina normal and uterus normal.  Musculoskeletal: Normal range of  motion.  Neurological: She is alert and oriented to person, place, and time.  Skin: Skin is warm and dry.  Psychiatric: She has a normal mood and affect.   Results for orders placed or performed during the hospital encounter of 04/16/14 (from the past 24 hour(s))  Urinalysis, Routine w reflex microscopic     Status: Abnormal   Collection Time: 04/16/14  3:12 PM  Result Value Ref Range   Color, Urine YELLOW YELLOW   APPearance CLEAR CLEAR   Specific Gravity, Urine >1.030 (H) 1.005 - 1.030   pH 6.0 5.0 - 8.0   Glucose, UA NEGATIVE NEGATIVE mg/dL   Hgb urine dipstick NEGATIVE NEGATIVE   Bilirubin Urine NEGATIVE NEGATIVE   Ketones, ur NEGATIVE NEGATIVE mg/dL   Protein, ur NEGATIVE NEGATIVE mg/dL   Urobilinogen, UA 0.2 0.0 - 1.0 mg/dL   Nitrite NEGATIVE NEGATIVE   Leukocytes, UA NEGATIVE NEGATIVE  Pregnancy, urine POC     Status: None   Collection Time: 04/16/14  3:17 PM  Result Value Ref Range   Preg Test, Ur NEGATIVE NEGATIVE  Wet prep, genital     Status: Abnormal   Collection Time: 04/16/14  4:27 PM  Result Value Ref Range   Yeast Wet Prep HPF POC NONE SEEN NONE SEEN   Trich, Wet Prep NONE SEEN NONE SEEN   Clue Cells Wet Prep HPF POC FEW (A) NONE SEEN   WBC, Wet Prep HPF POC FEW (A) NONE SEEN    MAU Course  Procedures  MDM Phenergan for nausea/ feels much better Wet Prep Gc/ chlamydia  Assessment and Plan   A: Nausea   P: Phenergan  25 mg for home Bland diet/ Brat diet Follow up prn   Georgia Duff 04/16/2014, 4:31 PM

## 2014-04-16 NOTE — MAU Note (Signed)
Started cramping around 11 last night.  Cramping is gone, now just having a little pain with urination.

## 2014-04-16 NOTE — Discharge Instructions (Signed)
Nausea, Adult Nausea is the feeling that you have an upset stomach or have to vomit. Nausea by itself is not likely a serious concern, but it may be an early sign of more serious medical problems. As nausea gets worse, it can lead to vomiting. If vomiting develops, there is the risk of dehydration.  CAUSES   Viral infections.  Food poisoning.  Medicines.  Pregnancy.  Motion sickness.  Migraine headaches.  Emotional distress.  Severe pain from any source.  Alcohol intoxication. HOME CARE INSTRUCTIONS  Get plenty of rest.  Ask your caregiver about specific rehydration instructions.  Eat small amounts of food and sip liquids more often.  Take all medicines as told by your caregiver. SEEK MEDICAL CARE IF:  You have not improved after 2 days, or you get worse.  You have a headache. SEEK IMMEDIATE MEDICAL CARE IF:   You have a fever.  You faint.  You keep vomiting or have blood in your vomit.  You are extremely weak or dehydrated.  You have dark or bloody stools.  You have severe chest or abdominal pain. MAKE SURE YOU:  Understand these instructions.  Will watch your condition.  Will get help right away if you are not doing well or get worse. Document Released: 04/28/2004 Document Revised: 12/14/2011 Document Reviewed: 12/01/2010 [facility] Patient Information 2015 [place], [place]. This information is not intended to replace advice given to you by your health care provider. Make sure you discuss any questions you have with your health care provider. Food Choices to Help Relieve Diarrhea When you have diarrhea, the foods you eat and your eating habits are very important. Choosing the right foods and drinks can help relieve diarrhea. Also, because diarrhea can last up to 7 days, you need to replace lost fluids and electrolytes (such as sodium, potassium, and chloride) in order to help prevent dehydration.  WHAT GENERAL GUIDELINES DO I NEED TO FOLLOW?  Slowly drink 1  cup (8 oz) of fluid for each episode of diarrhea. If you are getting enough fluid, your urine will be clear or pale yellow.  Eat starchy foods. Some good choices include white rice, white toast, pasta, low-fiber cereal, baked potatoes (without the skin), saltine crackers, and bagels.  Avoid large servings of any cooked vegetables.  Limit fruit to two servings per day. A serving is  cup or 1 small piece.  Choose foods with less than 2 g of fiber per serving.  Limit fats to less than 8 tsp (38 g) per day.  Avoid fried foods.  Eat foods that have probiotics in them. Probiotics can be found in certain dairy products.  Avoid foods and beverages that may increase the speed at which food moves through the stomach and intestines (gastrointestinal tract). Things to avoid include:  High-fiber foods, such as dried fruit, raw fruits and vegetables, nuts, seeds, and whole grain foods.  Spicy foods and high-fat foods.  Foods and beverages sweetened with high-fructose corn syrup, honey, or sugar alcohols such as xylitol, sorbitol, and mannitol. WHAT FOODS ARE RECOMMENDED? Grains White rice. White, [place], or pita breads (fresh or toasted), including plain rolls, buns, or bagels. White pasta. Saltine, soda, or graham crackers. Pretzels. Low-fiber cereal. Cooked cereals made with water (such as cornmeal, farina, or cream cereals). Plain muffins. Matzo. Melba toast. Zwieback.  Vegetables Potatoes (without the skin). Strained tomato and vegetable juices. Most well-cooked and canned vegetables without seeds. Tender lettuce. Fruits Cooked or canned applesauce, apricots, cherries, fruit cocktail, grapefruit, peaches, pears, or  plums. Fresh bananas, apples without skin, cherries, grapes, cantaloupe, grapefruit, peaches, oranges, or plums.  Meat and Other Protein Products Baked or boiled chicken. Eggs. Tofu. Fish. Seafood. Smooth peanut butter. Ground or well-cooked tender beef, ham, veal, lamb, pork, or  poultry.  Dairy Plain yogurt, kefir, and unsweetened liquid yogurt. Lactose-free milk, buttermilk, or soy milk. Plain hard cheese. Beverages Sport drinks. Clear broths. Diluted fruit juices (except prune). Regular, caffeine-free sodas such as ginger ale. Water. Decaffeinated teas. Oral rehydration solutions. Sugar-free beverages not sweetened with sugar alcohols. Other Bouillon, broth, or soups made from recommended foods.  The items listed above may not be a complete list of recommended foods or beverages. Contact your dietitian for more options. WHAT FOODS ARE NOT RECOMMENDED? Grains Whole grain, whole wheat, bran, or rye breads, rolls, pastas, crackers, and cereals. Wild or brown rice. Cereals that contain more than 2 g of fiber per serving. Corn tortillas or taco shells. Cooked or dry oatmeal. Granola. Popcorn. Vegetables Raw vegetables. Cabbage, broccoli, Brussels sprouts, artichokes, baked beans, beet greens, corn, kale, legumes, peas, sweet potatoes, and yams. Potato skins. Cooked spinach and cabbage. Fruits Dried fruit, including raisins and dates. Raw fruits. Stewed or dried prunes. Fresh apples with skin, apricots, mangoes, pears, raspberries, and strawberries.  Meat and Other Protein Products Chunky peanut butter. Nuts and seeds. Beans and lentils. Berniece Salines.  Dairy High-fat cheeses. Milk, chocolate milk, and beverages made with milk, such as milk shakes. Cream. Ice cream. Sweets and Desserts Sweet rolls, doughnuts, and sweet breads. Pancakes and waffles. Fats and Oils Butter. Cream sauces. Margarine. Salad oils. Plain salad dressings. Olives. Avocados.  Beverages Caffeinated beverages (such as coffee, tea, soda, or energy drinks). Alcoholic beverages. Fruit juices with pulp. Prune juice. Soft drinks sweetened with high-fructose corn syrup or sugar alcohols. Other Coconut. Hot sauce. Chili powder. Mayonnaise. Gravy. Cream-based or milk-based soups.  The items listed above may not be  a complete list of foods and beverages to avoid. Contact your dietitian for more information. WHAT SHOULD I DO IF I BECOME DEHYDRATED? Diarrhea can sometimes lead to dehydration. Signs of dehydration include dark urine and dry mouth and skin. If you think you are dehydrated, you should rehydrate with an oral rehydration solution. These solutions can be purchased at pharmacies, retail stores, or online.  Drink -1 cup (120-240 mL) of oral rehydration solution each time you have an episode of diarrhea. If drinking this amount makes your diarrhea worse, try drinking smaller amounts more often. For example, drink 1-3 tsp (5-15 mL) every 5-10 minutes.  A general rule for staying hydrated is to drink 1-2 L of fluid per day. Talk to your health care provider about the specific amount you should be drinking each day. Drink enough fluids to keep your urine clear or pale yellow. Document Released: 06/11/2003 Document Revised: 03/26/2013 Document Reviewed: 02/11/2013 [facility] Patient Information 2015 [place], [place]. This information is not intended to replace advice given to you by your health care provider. Make sure you discuss any questions you have with your health care provider.

## 2014-04-16 NOTE — MAU Note (Signed)
Has had diarrhea the last couple days. 3 episodes watery diarrhea in the last 24 hours.  No one else at home is sick.

## 2014-04-17 LAB — GC/CHLAMYDIA PROBE AMP (~~LOC~~) NOT AT ARMC
CHLAMYDIA, DNA PROBE: POSITIVE — AB
NEISSERIA GONORRHEA: NEGATIVE

## 2014-04-23 ENCOUNTER — Inpatient Hospital Stay (HOSPITAL_COMMUNITY)
Admission: AD | Admit: 2014-04-23 | Discharge: 2014-04-23 | Disposition: A | Discharge status: Home / Self Care | Payer: Medicaid Other | Source: Ambulatory Visit | Attending: Obstetrics | Admitting: Obstetrics

## 2014-04-23 ENCOUNTER — Encounter (HOSPITAL_COMMUNITY): Payer: Self-pay | Admitting: General Practice

## 2014-04-23 DIAGNOSIS — A5602 Chlamydial vulvovaginitis: Secondary | ICD-10-CM | POA: Diagnosis not present

## 2014-04-23 DIAGNOSIS — A749 Chlamydial infection, unspecified: Secondary | ICD-10-CM

## 2014-04-23 DIAGNOSIS — N939 Abnormal uterine and vaginal bleeding, unspecified: Secondary | ICD-10-CM

## 2014-04-23 LAB — URINALYSIS, ROUTINE W REFLEX MICROSCOPIC
Bilirubin Urine: NEGATIVE
GLUCOSE, UA: NEGATIVE mg/dL
Ketones, ur: NEGATIVE mg/dL
Leukocytes, UA: NEGATIVE
NITRITE: NEGATIVE
PH: 5.5 (ref 5.0–8.0)
Protein, ur: 100 mg/dL — AB
Specific Gravity, Urine: >1.03 — ABNORMAL HIGH (ref 1.005–1.030)
Urobilinogen, UA: 0.2 mg/dL (ref 0.0–1.0)

## 2014-04-23 LAB — CBC
HCT: 37.4 % (ref 36.0–46.0)
Hemoglobin: 12.1 g/dL (ref 12.0–15.0)
MCH: 25.9 pg — AB (ref 26.0–34.0)
MCHC: 32.4 g/dL (ref 30.0–36.0)
MCV: 79.9 fL (ref 78.0–100.0)
Platelets: 236 10*3/uL (ref 150–400)
RBC: 4.68 MIL/uL (ref 3.87–5.11)
RDW: 13.4 % (ref 11.5–15.5)
WBC: 7.6 10*3/uL (ref 4.0–10.5)

## 2014-04-23 LAB — URINE MICROSCOPIC-ADD ON

## 2014-04-23 LAB — POCT PREGNANCY, URINE: PREG TEST UR: NEGATIVE

## 2014-04-23 MED ORDER — MEDROXYPROGESTERONE ACETATE 150 MG/ML IM SUSP
150.0000 mg | Freq: Once | INTRAMUSCULAR | Status: DC
  Filled 2014-04-23: qty 1

## 2014-04-23 MED ORDER — AZITHROMYCIN 250 MG PO TABS
1000.0000 mg | ORAL_TABLET | Freq: Once | ORAL | Status: AC
  Administered 2014-04-23: 1000 mg via ORAL
  Filled 2014-04-23: qty 4

## 2014-04-23 MED ORDER — IBUPROFEN 600 MG PO TABS
600.0000 mg | ORAL_TABLET | Freq: Once | ORAL | Status: AC
  Administered 2014-04-23: 600 mg via ORAL
  Filled 2014-04-23: qty 1

## 2014-04-23 NOTE — MAU Provider Note (Signed)
History     CSN: [id number]  Arrival date and time: 04/23/14 1208   First Provider Initiated Contact with Patient 04/23/14 1541      Chief Complaint  Patient presents with  . Vaginal Bleeding  . Emesis   HPI   [name] is a 24 y.o. female G2P0110 who presents for vaginal bleeding. She called Dr. Jacelyn Grip office to discuss her vaginal bleeding that started today. The patient does not feel it is her menstrual cycle because her bleeding started 5 days later than her regular menstrual cycle.    OB History    Gravida Para Term Preterm AB TAB SAB Ectopic Multiple Living   2 1 0 1 1 0 1 0 0 0       Past Medical History  Diagnosis Date  . Depression     depression  . Bipolar disorder   . ADHD (attention deficit hyperactivity disorder)   . Anxiety   . Anemia   . ADHD (attention deficit hyperactivity disorder)   . Bipolar 1 disorder   . Chlamydia 05-31-10  . Pseudocyesis 2013    Seen in MAU for percieved FM, abd distension. Normal exam.   . Gonorrhea contact, treated   . Pregnancy induced hypertension     Past Surgical History  Procedure Laterality Date  . Nasal septum surgery    . Cesarean section N/A 09/01/2012    Procedure:  Primary cesarean section with delivery of baby girl at 63.  Apgars 1/1.  ;  Surgeon: Frederico Hamman, MD;  Location: [facility];  Service: Obstetrics;  Laterality: N/A;    Family History  Problem Relation Age of Onset  . Kidney disease Mother   . Hypertension Father   . Cancer Sister   . Asthma Brother   . Heart disease Neg Hx   . Diabetes Maternal Grandfather     History  Substance Use Topics  . Smoking status: Never Smoker   . Smokeless tobacco: Never Used  . Alcohol Use: No    Allergies: No Known Allergies  Prescriptions prior to admission  Medication Sig Dispense Refill Last Dose  . amLODipine (NORVASC) 5 MG tablet Take 5 mg by mouth daily. Patient takes Amlodipine 5 mg tablet at bedtime.   04/22/2014 at Unknown time  .  naproxen (NAPROSYN) 500 MG tablet Take 1 tablet (500 mg total) by mouth 2 (two) times daily as needed for mild pain, moderate pain or headache (TAKE WITH MEALS.). 20 tablet 0 Past Week at Unknown time  . triamterene-hydrochlorothiazide (DYAZIDE) 37.5-25 MG per capsule Take 1 each (1 capsule total) by mouth daily. 30 capsule 11 04/22/2014 at Unknown time  . ondansetron (ZOFRAN ODT) 4 MG disintegrating tablet Take 1 tablet (4 mg total) by mouth every 8 (eight) hours as needed for nausea or vomiting. 15 tablet 0 more than one month  . promethazine (PHENERGAN) 25 MG tablet Take 1 tablet (25 mg total) by mouth every 6 (six) hours as needed for nausea or vomiting. 30 tablet 0 more than one month   Results for orders placed or performed during the hospital encounter of 04/23/14 (from the past 48 hour(s))  Urinalysis, Routine w reflex microscopic     Status: Abnormal   Collection Time: 04/23/14 12:28 PM  Result Value Ref Range   Color, Urine RED (A) YELLOW    Comment: BIOCHEMICALS MAY BE AFFECTED BY COLOR   APPearance CLOUDY (A) CLEAR   Specific Gravity, Urine >1.030 (H) 1.005 - 1.030   pH 5.5  5.0 - 8.0   Glucose, UA NEGATIVE NEGATIVE mg/dL   Hgb urine dipstick LARGE (A) NEGATIVE   Bilirubin Urine NEGATIVE NEGATIVE   Ketones, ur NEGATIVE NEGATIVE mg/dL   Protein, ur 100 (A) NEGATIVE mg/dL   Urobilinogen, UA 0.2 0.0 - 1.0 mg/dL   Nitrite NEGATIVE NEGATIVE   Leukocytes, UA NEGATIVE NEGATIVE  Urine microscopic-add on     Status: None   Collection Time: 04/23/14 12:28 PM  Result Value Ref Range   Squamous Epithelial / LPF RARE RARE   RBC / HPF TOO NUMEROUS TO COUNT <3 RBC/hpf  Pregnancy, urine POC     Status: None   Collection Time: 04/23/14 12:39 PM  Result Value Ref Range   Preg Test, Ur NEGATIVE NEGATIVE    Comment:        THE SENSITIVITY OF THIS METHODOLOGY IS >24 mIU/mL   CBC     Status: Abnormal   Collection Time: 04/23/14  4:18 PM  Result Value Ref Range   WBC 7.6 4.0 - 10.5 K/uL    RBC 4.68 3.87 - 5.11 MIL/uL   Hemoglobin 12.1 12.0 - 15.0 g/dL   HCT 37.4 36.0 - 46.0 %   MCV 79.9 78.0 - 100.0 fL   MCH 25.9 (L) 26.0 - 34.0 pg   MCHC 32.4 30.0 - 36.0 g/dL   RDW 13.4 11.5 - 15.5 %   Platelets 236 150 - 400 K/uL   Review of Systems  Constitutional: Negative for fever and chills.  Gastrointestinal: Positive for abdominal pain (Bilateral lower abdominal cramping ). Negative for nausea and vomiting.  Neurological: Positive for headaches.   Physical Exam   Blood pressure 127/80, pulse 84, temperature 97.6 F (36.4 C), temperature source Oral, resp. rate 18, height 5' (1.524 m), weight 55.883 kg (123 lb 3.2 oz), last menstrual period 03/18/2014.  Physical Exam  Constitutional: She is oriented to person, place, and time. She appears well-developed and well-nourished. No distress.  HENT:  Head: Normocephalic.  Eyes: Pupils are equal, round, and reactive to light.  Neck: Neck supple.  Respiratory: Effort normal.  GI: Soft. She exhibits no distension. There is no tenderness. There is no rebound and no guarding.  Genitourinary:  Speculum exam: Vagina - moderate amount of dark red blood in her vaginal canal.  Cervix - + active bleeding  Bimanual exam: Cervix closed Uterus non tender, slightly enlarged  Adnexa non tender, no masses bilaterally  Chaperone present for exam.   Musculoskeletal: Normal range of motion.  Neurological: She is alert and oriented to person, place, and time.  Skin: Skin is warm. She is not diaphoretic.  Psychiatric: Her behavior is normal. She expresses no suicidal ideation.    MAU Course  Procedures  None  MDM Patient requested depo injection> patient has a history of depression due to losing a child> I discussed how depo is not a good option for her.  Patient declined BC pills.   Assessment and Plan   A:  1. Abnormal vaginal bleeding   2. Chlamydia infection    P:  Discharge home in stable condition Bleeding precautions Ok  to take ibuprofen as needed, as directed on the bottle Follow up with Dr. Jodi Mourning Partner needs treatment, no intercourse for 7 days.     Darrelyn Hillock Tariyah Pendry, NP 04/23/2014 4:38 PM

## 2014-04-23 NOTE — MAU Note (Signed)
Started bleeding this a.m., bright red, noted with wiping.  Vomiting also started this morning, HA.  Was seen in MAU last week for diarrhea.  Called MD office & was advised to come to MAU.

## 2014-04-23 NOTE — Discharge Instructions (Signed)
Abnormal Uterine Bleeding Abnormal uterine bleeding can affect women at various stages in life, including teenagers, women in their reproductive years, pregnant women, and women who have reached menopause. Several kinds of uterine bleeding are considered abnormal, including:  Bleeding or spotting between periods.   Bleeding after sexual intercourse.   Bleeding that is heavier or more than normal.   Periods that last longer than usual.  Bleeding after menopause.  Many cases of abnormal uterine bleeding are minor and simple to treat, while others are more serious. Any type of abnormal bleeding should be evaluated by your health care provider. Treatment will depend on the cause of the bleeding. HOME CARE INSTRUCTIONS Monitor your condition for any changes. The following actions may help to alleviate any discomfort you are experiencing:  Avoid the use of tampons and douches as directed by your health care provider.  Change your pads frequently. You should get regular pelvic exams and Pap tests. Keep all follow-up appointments for diagnostic tests as directed by your health care provider.  SEEK MEDICAL CARE IF:   Your bleeding lasts more than 1 week.   You feel dizzy at times.  SEEK IMMEDIATE MEDICAL CARE IF:   You pass out.   You are changing pads every 15 to 30 minutes.   You have abdominal pain.  You have a fever.   You become sweaty or weak.   You are passing large blood clots from the vagina.   You start to feel nauseous and vomit. MAKE SURE YOU:   Understand these instructions.  Will watch your condition.  Will get help right away if you are not doing well or get worse. Document Released: 03/21/2005 Document Revised: 03/26/2013 Document Reviewed: 10/18/2012 ExitCare Patient Information 2015 [employer]. This information is not intended to replace advice given to you by your health care provider. Make sure you discuss any questions you have with your  health care provider.  

## 2014-04-30 ENCOUNTER — Ambulatory Visit (INDEPENDENT_AMBULATORY_CARE_PROVIDER_SITE_OTHER): Payer: Medicaid Other | Admitting: Obstetrics

## 2014-04-30 ENCOUNTER — Encounter: Payer: Self-pay | Admitting: Obstetrics

## 2014-04-30 VITALS — BP 126/88 | HR 106 | Temp 98.0°F | Ht 60.0 in | Wt 124.0 lb

## 2014-04-30 DIAGNOSIS — R52 Pain, unspecified: Secondary | ICD-10-CM

## 2014-04-30 DIAGNOSIS — K219 Gastro-esophageal reflux disease without esophagitis: Secondary | ICD-10-CM

## 2014-04-30 MED ORDER — DICYCLOMINE HCL 10 MG PO CAPS: 10.0000 mg | ORAL_CAPSULE | Freq: Three times a day (TID) | ORAL | Status: DC

## 2014-04-30 MED ORDER — OMEPRAZOLE 20 MG PO CPDR: 20.0000 mg | DELAYED_RELEASE_CAPSULE | Freq: Two times a day (BID) | ORAL | Status: DC

## 2014-04-30 MED ORDER — TRAMADOL HCL 50 MG PO TABS: 100.0000 mg | ORAL_TABLET | Freq: Four times a day (QID) | ORAL | Status: DC | PRN

## 2014-05-02 ENCOUNTER — Encounter: Payer: Self-pay | Admitting: Obstetrics

## 2014-05-02 NOTE — Progress Notes (Signed)
Patient ID: Stephanie Mack, female   DOB: 1990/11/05, 24 y.o.   MRN: [id number]  Chief Complaint  Patient presents with  . Abdominal Pain    HPI Stephanie Mack is a 24 y.o. female.  Lower and upper abdominal pain.   Burning upper abdominal pain after eating. HPI  Past Medical History  Diagnosis Date  . Depression     depression  . Bipolar disorder   . ADHD (attention deficit hyperactivity disorder)   . Anxiety   . Anemia   . ADHD (attention deficit hyperactivity disorder)   . Bipolar 1 disorder   . Chlamydia 05-31-10  . Pseudocyesis 2013    Seen in MAU for percieved FM, abd distension. Normal exam.   . Gonorrhea contact, treated   . Pregnancy induced hypertension     Past Surgical History  Procedure Laterality Date  . Nasal septum surgery    . Cesarean section N/A 09/01/2012    Procedure:  Primary cesarean section with delivery of baby girl at 47.  Apgars 1/1.  ;  Surgeon: Frederico Hamman, MD;  Location: [facility];  Service: Obstetrics;  Laterality: N/A;    Family History  Problem Relation Age of Onset  . Kidney disease Mother   . Hypertension Father   . Cancer Sister   . Asthma Brother   . Heart disease Neg Hx   . Diabetes Maternal Grandfather     Social History History  Substance Use Topics  . Smoking status: Never Smoker   . Smokeless tobacco: Never Used  . Alcohol Use: No    No Known Allergies  Current Outpatient Prescriptions  Medication Sig Dispense Refill  . amLODipine (NORVASC) 5 MG tablet Take 5 mg by mouth daily. Patient takes Amlodipine 5 mg tablet at bedtime.    . naproxen (NAPROSYN) 500 MG tablet Take 1 tablet (500 mg total) by mouth 2 (two) times daily as needed for mild pain, moderate pain or headache (TAKE WITH MEALS.). 20 tablet 0  . ondansetron (ZOFRAN ODT) 4 MG disintegrating tablet Take 1 tablet (4 mg total) by mouth every 8 (eight) hours as needed for nausea or vomiting. 15 tablet 0  . triamterene-hydrochlorothiazide (DYAZIDE) 37.5-25  MG per capsule Take 1 each (1 capsule total) by mouth daily. 30 capsule 11  . dicyclomine (BENTYL) 10 MG capsule Take 1 capsule (10 mg total) by mouth 4 (four) times daily -  before meals and at bedtime. 40 capsule 1  . omeprazole (PRILOSEC) 20 MG capsule Take 1 capsule (20 mg total) by mouth 2 (two) times daily before a meal. 60 capsule 5  . promethazine (PHENERGAN) 25 MG tablet Take 1 tablet (25 mg total) by mouth every 6 (six) hours as needed for nausea or vomiting. (Patient not taking: Reported on 04/30/2014) 30 tablet 0  . traMADol (ULTRAM) 50 MG tablet Take 2 tablets (100 mg total) by mouth every 6 (six) hours as needed for moderate pain or severe pain. 40 tablet 2   No current facility-administered medications for this visit.    Review of Systems Review of Systems Constitutional: negative for fatigue and weight loss Respiratory: negative for cough and wheezing Cardiovascular: negative for chest pain, fatigue and palpitations Gastrointestinal: negative for abdominal pain and change in bowel habits Genitourinary: abdominal pain Integument/breast: negative for nipple discharge Musculoskeletal:negative for myalgias Neurological: negative for gait problems and tremors Behavioral/Psych: negative for abusive relationship, depression Endocrine: negative for temperature intolerance     Blood pressure 126/88, pulse 106, temperature 98 F (36.7  C), height 5' (1.524 m), weight 124 lb (56.246 kg), last menstrual period 03/18/2014.  Physical Exam Physical Exam General:   alert  Skin:   no rash or abnormalities  Lungs:   clear to auscultation bilaterally  Heart:   regular rate and rhythm, S1, S2 normal, no murmur, click, rub or gallop  Breasts:   normal without suspicious masses, skin or nipple changes or axillary nodes  Abdomen:  normal findings: no organomegaly, soft, non-tender and no hernia  Pelvis:  External genitalia: normal general appearance Urinary system: urethral meatus normal and  bladder without fullness, nontender Vaginal: normal without tenderness, induration or masses Cervix: normal appearance Adnexa: normal exam Uterus: anteverted and non-tender, normal size    100% of 10 min visit spent on counseling and coordination of care.   Data Reviewed Labs     Assessment    Pain disorder.  Possible GERD or IBS.    Plan  Omeprazole Rx. Ultram Rx Ultrasound ordered. F/U in 2 weeks.    Orders Placed This Encounter  Procedures  . US Transvaginal Non-OB    Standing Status: Future     Number of Occurrences:      Standing Expiration Date: 06/29/2015    Order Specific Question:  Reason for Exam (SYMPTOM  OR DIAGNOSIS REQUIRED)    Answer:  pelvic pain    Order Specific Question:  Preferred imaging location?    Answer:  Internal  . US Pelvis Complete    Standing Status: Future     Number of Occurrences:      Standing Expiration Date: 06/29/2015    Order Specific Question:  Reason for Exam (SYMPTOM  OR DIAGNOSIS REQUIRED)    Answer:  pelvic pain    Order Specific Question:  Preferred imaging location?    Answer:  Internal   Meds ordered this encounter  Medications  . omeprazole (PRILOSEC) 20 MG capsule    Sig: Take 1 capsule (20 mg total) by mouth 2 (two) times daily before a meal.    Dispense:  60 capsule    Refill:  5  . dicyclomine (BENTYL) 10 MG capsule    Sig: Take 1 capsule (10 mg total) by mouth 4 (four) times daily -  before meals and at bedtime.    Dispense:  40 capsule    Refill:  1  . traMADol (ULTRAM) 50 MG tablet    Sig: Take 2 tablets (100 mg total) by mouth every 6 (six) hours as needed for moderate pain or severe pain.    Dispense:  40 tablet    Refill:  2

## 2014-05-07 ENCOUNTER — Ambulatory Visit (INDEPENDENT_AMBULATORY_CARE_PROVIDER_SITE_OTHER): Payer: Medicaid Other

## 2014-05-07 ENCOUNTER — Other Ambulatory Visit: Payer: Medicaid Other

## 2014-05-07 DIAGNOSIS — R52 Pain, unspecified: Secondary | ICD-10-CM

## 2014-05-14 ENCOUNTER — Ambulatory Visit: Payer: Medicaid Other | Admitting: Obstetrics

## 2014-05-20 ENCOUNTER — Ambulatory Visit: Payer: Medicaid Other | Admitting: Obstetrics

## 2014-06-03 ENCOUNTER — Encounter: Payer: Self-pay | Admitting: Obstetrics

## 2014-06-03 ENCOUNTER — Ambulatory Visit (INDEPENDENT_AMBULATORY_CARE_PROVIDER_SITE_OTHER): Payer: Medicaid Other | Admitting: Obstetrics

## 2014-06-03 VITALS — BP 126/79 | HR 111 | Temp 97.9°F | Ht 60.0 in | Wt 124.0 lb

## 2014-06-03 DIAGNOSIS — Z3201 Encounter for pregnancy test, result positive: Secondary | ICD-10-CM | POA: Diagnosis not present

## 2014-06-03 DIAGNOSIS — R102 Pelvic and perineal pain: Secondary | ICD-10-CM | POA: Diagnosis not present

## 2014-06-03 LAB — POCT URINE PREGNANCY: Preg Test, Ur: POSITIVE

## 2014-06-03 NOTE — Progress Notes (Signed)
Patient ID: Stephanie Mack, female   DOB: [birth year], 24 y.o.   MRN: [id number]  Chief Complaint  Patient presents with  . Follow-up    U/S    HPI Stephanie Mack is a 24 y.o. female.  H/O pelvic pain.  Ultrasound done and is WNL's.  H/O + CT in 8 / 2015 and 1 / 2015, treated. HPI  Past Medical History  Diagnosis Date  . Depression     depression  . Bipolar disorder   . ADHD (attention deficit hyperactivity disorder)   . Anxiety   . Anemia   . ADHD (attention deficit hyperactivity disorder)   . Bipolar 1 disorder   . Chlamydia 05-31-10  . Pseudocyesis 2013    Seen in MAU for percieved FM, abd distension. Normal exam.   . Gonorrhea contact, treated   . Pregnancy induced hypertension     Past Surgical History  Procedure Laterality Date  . Nasal septum surgery    . Cesarean section N/A 09/01/2012    Procedure:  Primary cesarean section with delivery of baby girl at 4.  Apgars 1/1.  ;  Surgeon: Frederico Hamman, MD;  Location: [facility];  Service: Obstetrics;  Laterality: N/A;    Family History  Problem Relation Age of Onset  . Kidney disease Mother   . Hypertension Father   . Cancer Sister   . Asthma Brother   . Heart disease Neg Hx   . Diabetes Maternal Grandfather     Social History History  Substance Use Topics  . Smoking status: Never Smoker   . Smokeless tobacco: Never Used  . Alcohol Use: No    No Known Allergies  Current Outpatient Prescriptions  Medication Sig Dispense Refill  . amLODipine (NORVASC) 5 MG tablet Take 5 mg by mouth daily. Patient takes Amlodipine 5 mg tablet at bedtime.    . dicyclomine (BENTYL) 10 MG capsule Take 1 capsule (10 mg total) by mouth 4 (four) times daily -  before meals and at bedtime. 40 capsule 1  . naproxen (NAPROSYN) 500 MG tablet Take 1 tablet (500 mg total) by mouth 2 (two) times daily as needed for mild pain, moderate pain or headache (TAKE WITH MEALS.). 20 tablet 0  . omeprazole (PRILOSEC) 20 MG capsule Take 1 capsule  (20 mg total) by mouth 2 (two) times daily before a meal. 60 capsule 5  . promethazine (PHENERGAN) 25 MG tablet Take 1 tablet (25 mg total) by mouth every 6 (six) hours as needed for nausea or vomiting. 30 tablet 0  . traMADol (ULTRAM) 50 MG tablet Take 2 tablets (100 mg total) by mouth every 6 (six) hours as needed for moderate pain or severe pain. 40 tablet 2  . triamterene-hydrochlorothiazide (DYAZIDE) 37.5-25 MG per capsule Take 1 each (1 capsule total) by mouth daily. 30 capsule 11  . ondansetron (ZOFRAN ODT) 4 MG disintegrating tablet Take 1 tablet (4 mg total) by mouth every 8 (eight) hours as needed for nausea or vomiting. (Patient not taking: Reported on 06/03/2014) 15 tablet 0   No current facility-administered medications for this visit.    Review of Systems Review of Systems Constitutional: negative for fatigue and weight loss Respiratory: negative for cough and wheezing Cardiovascular: negative for chest pain, fatigue and palpitations Gastrointestinal: negative for abdominal pain and change in bowel habits Genitourinary: pelvic pain Integument/breast: negative for nipple discharge Musculoskeletal:negative for myalgias Neurological: negative for gait problems and tremors Behavioral/Psych: negative for abusive relationship, depression Endocrine: negative for temperature intolerance  Blood pressure 126/79, pulse 111, temperature 97.9 F (36.6 C), height 5' (1.524 m), weight 124 lb (56.246 kg), last menstrual period 04/30/2014.  Physical Exam Physical Exam:  Deferred  100% of 10 min visit spent on counseling and coordination of care.   Data Reviewed Labs: UPT positive  Assessment     Early pregnancy.     Plan    F/U for NOB visit.  No orders of the defined types were placed in this encounter.   No orders of the defined types were placed in this encounter.

## 2014-06-04 ENCOUNTER — Inpatient Hospital Stay (HOSPITAL_COMMUNITY)
Admission: AD | Admit: 2014-06-04 | Discharge: 2014-06-04 | Disposition: A | Discharge status: Home / Self Care | Payer: Medicaid Other | Source: Ambulatory Visit | Attending: Obstetrics | Admitting: Obstetrics

## 2014-06-04 ENCOUNTER — Encounter (HOSPITAL_COMMUNITY): Payer: Self-pay | Admitting: *Deleted

## 2014-06-04 ENCOUNTER — Encounter: Payer: Self-pay | Admitting: Obstetrics

## 2014-06-04 DIAGNOSIS — R51 Headache: Secondary | ICD-10-CM | POA: Insufficient documentation

## 2014-06-04 DIAGNOSIS — Z3A00 Weeks of gestation of pregnancy not specified: Secondary | ICD-10-CM | POA: Diagnosis not present

## 2014-06-04 DIAGNOSIS — O26891 Other specified pregnancy related conditions, first trimester: Secondary | ICD-10-CM | POA: Insufficient documentation

## 2014-06-04 DIAGNOSIS — Z3A01 Less than 8 weeks gestation of pregnancy: Secondary | ICD-10-CM

## 2014-06-04 LAB — URINALYSIS, ROUTINE W REFLEX MICROSCOPIC
Bilirubin Urine: NEGATIVE
Glucose, UA: NEGATIVE mg/dL
Hgb urine dipstick: NEGATIVE
Ketones, ur: NEGATIVE mg/dL
Leukocytes, UA: NEGATIVE
NITRITE: NEGATIVE
Protein, ur: NEGATIVE mg/dL
SPECIFIC GRAVITY, URINE: 1.025 (ref 1.005–1.030)
UROBILINOGEN UA: 0.2 mg/dL (ref 0.0–1.0)
pH: 6 (ref 5.0–8.0)

## 2014-06-04 MED ORDER — BUTALBITAL-APAP-CAFFEINE 50-325-40 MG PO TABS
2.0000 | ORAL_TABLET | Freq: Once | ORAL | Status: AC
  Administered 2014-06-04: 2 via ORAL
  Filled 2014-06-04: qty 2

## 2014-06-04 NOTE — Discharge Instructions (Signed)

## 2014-06-04 NOTE — MAU Provider Note (Signed)
History     CSN: [id number]  Arrival date and time: 06/04/14 2045   First Provider Initiated Contact with Patient 06/04/14 2216      Chief Complaint  Patient presents with  . Headache   HPI  Stephanie Mack is a 24 y.o. G3P0110 at Unknown who presents today with a headache. She states that she gets headaches often and usually takes Tylenol. She could not find any Tylenol at her house today. She denies any abdominal pain or vaginal bleeding. She had a +UPT at [facility] yesterday. She has a NOB appointment in April.  Past Medical History  Diagnosis Date  . Depression     depression  . Bipolar disorder   . ADHD (attention deficit hyperactivity disorder)   . Anxiety   . Anemia   . ADHD (attention deficit hyperactivity disorder)   . Bipolar 1 disorder   . Chlamydia 05-31-10  . Pseudocyesis 2013    Seen in MAU for percieved FM, abd distension. Normal exam.   . Gonorrhea contact, treated   . Pregnancy induced hypertension     Past Surgical History  Procedure Laterality Date  . Nasal septum surgery    . Cesarean section N/A 09/01/2012    Procedure:  Primary cesarean section with delivery of baby girl at 17.  Apgars 1/1.  ;  Surgeon: Frederico Hamman, MD;  Location: [facility];  Service: Obstetrics;  Laterality: N/A;    Family History  Problem Relation Age of Onset  . Kidney disease Mother   . Hypertension Father   . Cancer Sister   . Asthma Brother   . Heart disease Neg Hx   . Diabetes Maternal Grandfather     History  Substance Use Topics  . Smoking status: Never Smoker   . Smokeless tobacco: Never Used  . Alcohol Use: No    Allergies: No Known Allergies  Prescriptions prior to admission  Medication Sig Dispense Refill Last Dose  . amLODipine (NORVASC) 5 MG tablet Take 5 mg by mouth daily. Patient takes Amlodipine 5 mg tablet at bedtime.   Past Week at Unknown time  . Prenatal Vit-Fe Fumarate-FA (PRENATAL MULTIVITAMIN) TABS tablet Take 1 tablet by mouth daily at 12  noon.   06/04/2014 at Unknown time  . triamterene-hydrochlorothiazide (DYAZIDE) 37.5-25 MG per capsule Take 1 each (1 capsule total) by mouth daily. 30 capsule 11 Past Week at Unknown time  . dicyclomine (BENTYL) 10 MG capsule Take 1 capsule (10 mg total) by mouth 4 (four) times daily -  before meals and at bedtime. (Patient not taking: Reported on 06/04/2014) 40 capsule 1 Not Taking at Unknown time  . omeprazole (PRILOSEC) 20 MG capsule Take 1 capsule (20 mg total) by mouth 2 (two) times daily before a meal. (Patient not taking: Reported on 06/04/2014) 60 capsule 5 Not Taking at Unknown time  . ondansetron (ZOFRAN ODT) 4 MG disintegrating tablet Take 1 tablet (4 mg total) by mouth every 8 (eight) hours as needed for nausea or vomiting. (Patient not taking: Reported on 06/03/2014) 15 tablet 0 Not Taking at Unknown time  . promethazine (PHENERGAN) 25 MG tablet Take 1 tablet (25 mg total) by mouth every 6 (six) hours as needed for nausea or vomiting. (Patient not taking: Reported on 06/04/2014) 30 tablet 0 Not Taking at Unknown time  . traMADol (ULTRAM) 50 MG tablet Take 2 tablets (100 mg total) by mouth every 6 (six) hours as needed for moderate pain or severe pain. (Patient not taking: Reported on 06/04/2014)  40 tablet 2 Not Taking at Unknown time    ROS Physical Exam   Blood pressure 147/88, pulse 85, temperature 97.7 F (36.5 C), temperature source Oral, resp. rate 18, height 5\' 2"  (1.575 m), weight 57.607 kg (127 lb), last menstrual period 04/30/2014, SpO2 100 %.  Physical Exam  Nursing note and vitals reviewed. Constitutional: She is oriented to person, place, and time. She appears well-developed and well-nourished. No distress.  Cardiovascular: Normal rate.   Respiratory: Effort normal.  GI: Soft. There is no tenderness.  Neurological: She is alert and oriented to person, place, and time.  Skin: Skin is warm and dry.  Psychiatric: She has a normal mood and affect.    MAU Course   Procedures  Results for orders placed or performed during the hospital encounter of 06/04/14 (from the past 24 hour(s))  Urinalysis, Routine w reflex microscopic     Status: None   Collection Time: 06/04/14  9:00 PM  Result Value Ref Range   Color, Urine YELLOW YELLOW   APPearance CLEAR CLEAR   Specific Gravity, Urine 1.025 1.005 - 1.030   pH 6.0 5.0 - 8.0   Glucose, UA NEGATIVE NEGATIVE mg/dL   Hgb urine dipstick NEGATIVE NEGATIVE   Bilirubin Urine NEGATIVE NEGATIVE   Ketones, ur NEGATIVE NEGATIVE mg/dL   Protein, ur NEGATIVE NEGATIVE mg/dL   Urobilinogen, UA 0.2 0.0 - 1.0 mg/dL   Nitrite NEGATIVE NEGATIVE   Leukocytes, UA NEGATIVE NEGATIVE   2223: Pain is now 0/10 after fioricet   Assessment and Plan   1. Headache in pregnancy, antepartum, first trimester    DC home  Safe medication list given Tylenol PRN for headaches  Return to MAU as needed  Follow-up Information    Follow up with Shelly Bombard, MD.   Specialty:  Obstetrics and Gynecology   Why:  As scheduled   Contact information:   [place] [address] [phone]        Mathis Bud 06/04/2014, 10:17 PM

## 2014-06-04 NOTE — MAU Note (Signed)
Pt reports her head has been hurting really bad since 10 am. Denies nausea or vomiting. States she just wants to check and make sure the baby is okay.

## 2014-06-04 NOTE — MAU Note (Signed)
Pt states she found out she was pregnant yesterday and has had a headache since 10 this morning and rates her pain around a 6.  Pt states she has not taken anything for pain.

## 2014-06-09 ENCOUNTER — Inpatient Hospital Stay (HOSPITAL_COMMUNITY): Payer: Medicaid Other

## 2014-06-09 ENCOUNTER — Inpatient Hospital Stay (HOSPITAL_COMMUNITY)
Admission: AD | Admit: 2014-06-09 | Discharge: 2014-06-09 | Disposition: A | Discharge status: Home / Self Care | Payer: Medicaid Other | Source: Ambulatory Visit | Attending: Obstetrics | Admitting: Obstetrics

## 2014-06-09 ENCOUNTER — Encounter (HOSPITAL_COMMUNITY): Payer: Self-pay | Admitting: *Deleted

## 2014-06-09 ENCOUNTER — Telehealth: Payer: Self-pay | Admitting: *Deleted

## 2014-06-09 DIAGNOSIS — R12 Heartburn: Secondary | ICD-10-CM | POA: Insufficient documentation

## 2014-06-09 DIAGNOSIS — Z3A01 Less than 8 weeks gestation of pregnancy: Secondary | ICD-10-CM | POA: Insufficient documentation

## 2014-06-09 DIAGNOSIS — R1030 Lower abdominal pain, unspecified: Secondary | ICD-10-CM | POA: Insufficient documentation

## 2014-06-09 DIAGNOSIS — R109 Unspecified abdominal pain: Secondary | ICD-10-CM

## 2014-06-09 DIAGNOSIS — N760 Acute vaginitis: Secondary | ICD-10-CM | POA: Diagnosis not present

## 2014-06-09 DIAGNOSIS — O23591 Infection of other part of genital tract in pregnancy, first trimester: Secondary | ICD-10-CM | POA: Diagnosis not present

## 2014-06-09 DIAGNOSIS — B9689 Other specified bacterial agents as the cause of diseases classified elsewhere: Secondary | ICD-10-CM

## 2014-06-09 DIAGNOSIS — A499 Bacterial infection, unspecified: Secondary | ICD-10-CM

## 2014-06-09 DIAGNOSIS — O26899 Other specified pregnancy related conditions, unspecified trimester: Secondary | ICD-10-CM

## 2014-06-09 DIAGNOSIS — O26891 Other specified pregnancy related conditions, first trimester: Secondary | ICD-10-CM

## 2014-06-09 DIAGNOSIS — O208 Other hemorrhage in early pregnancy: Secondary | ICD-10-CM | POA: Insufficient documentation

## 2014-06-09 DIAGNOSIS — Z3481 Encounter for supervision of other normal pregnancy, first trimester: Secondary | ICD-10-CM

## 2014-06-09 LAB — CBC WITH DIFFERENTIAL/PLATELET
BASOS PCT: 0 % (ref 0–1)
Basophils Absolute: 0 10*3/uL (ref 0.0–0.1)
Eosinophils Absolute: 0.2 10*3/uL (ref 0.0–0.7)
Eosinophils Relative: 3 % (ref 0–5)
HEMATOCRIT: 37.2 % (ref 36.0–46.0)
HEMOGLOBIN: 12.3 g/dL (ref 12.0–15.0)
LYMPHS ABS: 2.6 10*3/uL (ref 0.7–4.0)
Lymphocytes Relative: 35 % (ref 12–46)
MCH: 26.2 pg (ref 26.0–34.0)
MCHC: 33.1 g/dL (ref 30.0–36.0)
MCV: 79.3 fL (ref 78.0–100.0)
MONO ABS: 0.6 10*3/uL (ref 0.1–1.0)
Monocytes Relative: 8 % (ref 3–12)
NEUTROS ABS: 3.9 10*3/uL (ref 1.7–7.7)
Neutrophils Relative %: 54 % (ref 43–77)
Platelets: 221 10*3/uL (ref 150–400)
RBC: 4.69 MIL/uL (ref 3.87–5.11)
RDW: 14.7 % (ref 11.5–15.5)
WBC: 7.4 10*3/uL (ref 4.0–10.5)

## 2014-06-09 LAB — URINALYSIS, ROUTINE W REFLEX MICROSCOPIC
BILIRUBIN URINE: NEGATIVE
GLUCOSE, UA: NEGATIVE mg/dL
Hgb urine dipstick: NEGATIVE
Ketones, ur: NEGATIVE mg/dL
Leukocytes, UA: NEGATIVE
Nitrite: NEGATIVE
PH: 6 (ref 5.0–8.0)
Protein, ur: NEGATIVE mg/dL
Specific Gravity, Urine: >1.03 — ABNORMAL HIGH (ref 1.005–1.030)
Urobilinogen, UA: 0.2 mg/dL (ref 0.0–1.0)

## 2014-06-09 LAB — WET PREP, GENITAL
Trich, Wet Prep: NONE SEEN
Yeast Wet Prep HPF POC: NONE SEEN

## 2014-06-09 LAB — HCG, QUANTITATIVE, PREGNANCY: hCG, Beta Chain, Quant, S: 69707 m[IU]/mL — ABNORMAL HIGH (ref <5)

## 2014-06-09 MED ORDER — VITAFOL ULTRA 29-0.6-0.4-200 MG PO CAPS: 1.0000 | ORAL_CAPSULE | Freq: Every day | ORAL | Status: DC

## 2014-06-09 MED ORDER — FAMOTIDINE 20 MG PO TABS: 20.0000 mg | ORAL_TABLET | Freq: Two times a day (BID) | ORAL | Status: DC

## 2014-06-09 MED ORDER — METRONIDAZOLE 500 MG PO TABS: 500.0000 mg | ORAL_TABLET | Freq: Two times a day (BID) | ORAL | Status: DC

## 2014-06-09 NOTE — Discharge Instructions (Signed)
First Trimester of Pregnancy The first trimester of pregnancy is from week 1 until the end of week 12 (months 1 through 3). During this time, your baby will begin to develop inside you. At 6-8 weeks, the eyes and face are formed, and the heartbeat can be seen on ultrasound. At the end of 12 weeks, all the baby's organs are formed. Prenatal care is all the medical care you receive before the birth of your baby. Make sure you get good prenatal care and follow all of your doctor's instructions. HOME CARE  Medicines  Take medicine only as told by your doctor. Some medicines are safe and some are not during pregnancy.  Take your prenatal vitamins as told by your doctor.  Take medicine that helps you poop (stool softener) as needed if your doctor says it is okay. Diet  Eat regular, healthy meals.  Your doctor will tell you the amount of weight gain that is right for you.  Avoid raw meat and uncooked cheese.  If you feel sick to your stomach (nauseous) or throw up (vomit):  Eat 4 or 5 small meals a day instead of 3 large meals.  Try eating a few soda crackers.  Drink liquids between meals instead of during meals.  If you have a hard time pooping (constipation):  Eat high-fiber foods like fresh vegetables, fruit, and whole grains.  Drink enough fluids to keep your pee (urine) clear or pale yellow. Activity and Exercise  Exercise only as told by your doctor. Stop exercising if you have cramps or pain in your lower belly (abdomen) or low back.  Try to avoid standing for long periods of time. Move your legs often if you must stand in one place for a long time.  Avoid heavy lifting.  Wear low-heeled shoes. Sit and stand up straight.  You can have sex unless your doctor tells you not to. Relief of Pain or Discomfort  Wear a good support bra if your breasts are sore.  Take warm water baths (sitz baths) to soothe pain or discomfort caused by hemorrhoids. Use hemorrhoid cream if your  doctor says it is okay.  Rest with your legs raised if you have leg cramps or low back pain.  Wear support hose if you have puffy, bulging veins (varicose veins) in your legs. Raise (elevate) your feet for 15 minutes, 3-4 times a day. Limit salt in your diet. Prenatal Care  Schedule your prenatal visits by the twelfth week of pregnancy.  Write down your questions. Take them to your prenatal visits.  Keep all your prenatal visits as told by your doctor. Safety  Wear your seat belt at all times when driving.  Make a list of emergency phone numbers. The list should include numbers for family, friends, the hospital, and police and fire departments. General Tips  Ask your doctor for a referral to a local prenatal class. Begin classes no later than at the start of month 6 of your pregnancy.  Ask for help if you need counseling or help with nutrition. Your doctor can give you advice or tell you where to go for help.  Do not use hot tubs, steam rooms, or saunas.  Do not douche or use tampons or scented sanitary pads.  Do not cross your legs for long periods of time.  Avoid litter boxes and soil used by cats.  Avoid all smoking, herbs, and alcohol. Avoid drugs not approved by your doctor.  Visit your dentist. At home, brush your teeth   with a soft toothbrush. Be gentle when you floss. GET HELP IF:  You are dizzy.  You have mild cramps or pressure in your lower belly.  You have a nagging pain in your belly area.  You continue to feel sick to your stomach, throw up, or have watery poop (diarrhea).  You have a bad smelling fluid coming from your vagina.  You have pain with peeing (urination).  You have increased puffiness (swelling) in your face, hands, legs, or ankles. GET HELP RIGHT AWAY IF:   You have a fever.  You are leaking fluid from your vagina.  You have spotting or bleeding from your vagina.  You have very bad belly cramping or pain.  You gain or lose weight  rapidly.  You throw up blood. It may look like coffee grounds.  You are around people who have German measles, fifth disease, or chickenpox.  You have a very bad headache.  You have shortness of breath.  You have any kind of trauma, such as from a fall or a car accident. Document Released: 09/07/2007 Document Revised: 08/05/2013 Document Reviewed: 01/29/2013 ExitCare Patient Information 2015 [employer]. This information is not intended to replace advice given to you by your health care provider. Make sure you discuss any questions you have with your health care provider.  

## 2014-06-09 NOTE — MAU Note (Signed)
Pt reports she has been having chest pains today, indicated mid/center chest and also reports lower abd pain. Denies dysuria, nausea, vomiting, or fever. States she has had a cough and cold for the last few days.

## 2014-06-09 NOTE — Telephone Encounter (Signed)
Patient given samples of prenatal vitamins at her last appointment. Patient states she prefers the Vitafol Ultra Prenatal Vitamins. Patient advised we would send a prescription to the pharmacy.

## 2014-06-09 NOTE — MAU Provider Note (Signed)
History     CSN: [id number]  Arrival date and time: 06/09/14 2059   First Provider Initiated Contact with Patient 06/09/14 2126      No chief complaint on file.  HPI [name] is a 24 y.o. G3P0110 at [redacted]w[redacted]d by LMP who presents to MAU today with complaint of lower abdominal cramping and burning in her throat. The patient states that the burning started this afternoon while laying down after she ate a hot pocket and [employer]. She denies SOB, N/V or palpitations. She denies chest pain. She states cough is worse after eating. She also complains of 5/10 intermitted sharp lower abdominal pain bilaterally. She denies vaginal bleeding, discharge or UTI symptoms.   OB History    Gravida Para Term Preterm AB TAB SAB Ectopic Multiple Living   3 1 0 1 1 0 1 0 0 0       Past Medical History  Diagnosis Date  . Depression     depression  . Bipolar disorder   . ADHD (attention deficit hyperactivity disorder)   . Anxiety   . Anemia   . ADHD (attention deficit hyperactivity disorder)   . Bipolar 1 disorder   . Chlamydia 05-31-10  . Pseudocyesis 2013    Seen in MAU for percieved FM, abd distension. Normal exam.   . Gonorrhea contact, treated   . Pregnancy induced hypertension     Past Surgical History  Procedure Laterality Date  . Nasal septum surgery    . Cesarean section N/A 09/01/2012    Procedure:  Primary cesarean section with delivery of baby girl at 5.  Apgars 1/1.  ;  Surgeon: Frederico Hamman, MD;  Location: [facility];  Service: Obstetrics;  Laterality: N/A;    Family History  Problem Relation Age of Onset  . Kidney disease Mother   . Hypertension Father   . Cancer Sister   . Asthma Brother   . Heart disease Neg Hx   . Diabetes Maternal Grandfather     History  Substance Use Topics  . Smoking status: Never Smoker   . Smokeless tobacco: Never Used  . Alcohol Use: No    Allergies: No Known Allergies  No prescriptions prior to admission    Review of  Systems  Constitutional: Negative for fever and malaise/fatigue.  Gastrointestinal: Positive for abdominal pain. Negative for nausea, vomiting, diarrhea and constipation.  Genitourinary: Negative for dysuria, urgency and frequency.       Neg - vaginal bleeding, discharge   Physical Exam   Blood pressure 122/65, pulse 84, temperature 98.8 F (37.1 C), temperature source Oral, resp. rate 18, height 5' (1.524 m), weight 124 lb (56.246 kg), last menstrual period 04/30/2014, SpO2 100 %.  Physical Exam  Constitutional: She is oriented to person, place, and time. She appears well-developed and well-nourished. No distress.  HENT:  Head: Normocephalic.  Cardiovascular: Normal rate.   Respiratory: Effort normal.  GI: Soft. She exhibits no distension and no mass. There is no tenderness. There is no rebound and no guarding.  Genitourinary: Uterus is enlarged (slightly). Uterus is not tender. Cervix exhibits no motion tenderness, no discharge and no friability. Right adnexum displays no mass and no tenderness. Left adnexum displays no mass and no tenderness. No bleeding in the vagina. Vaginal discharge (small amount of clear mucus discharge noted) found.  Neurological: She is alert and oriented to person, place, and time.  Skin: Skin is warm and dry.  Psychiatric: She has a normal mood and affect.  Results for orders placed or performed during the hospital encounter of 06/09/14 (from the past 24 hour(s))  Urinalysis, Routine w reflex microscopic     Status: Abnormal   Collection Time: 06/09/14  9:14 PM  Result Value Ref Range   Color, Urine YELLOW YELLOW   APPearance CLEAR CLEAR   Specific Gravity, Urine >1.030 (H) 1.005 - 1.030   pH 6.0 5.0 - 8.0   Glucose, UA NEGATIVE NEGATIVE mg/dL   Hgb urine dipstick NEGATIVE NEGATIVE   Bilirubin Urine NEGATIVE NEGATIVE   Ketones, ur NEGATIVE NEGATIVE mg/dL   Protein, ur NEGATIVE NEGATIVE mg/dL   Urobilinogen, UA 0.2 0.0 - 1.0 mg/dL   Nitrite NEGATIVE  NEGATIVE   Leukocytes, UA NEGATIVE NEGATIVE  Wet prep, genital     Status: Abnormal   Collection Time: 06/09/14 10:03 PM  Result Value Ref Range   Yeast Wet Prep HPF POC NONE SEEN NONE SEEN   Trich, Wet Prep NONE SEEN NONE SEEN   Clue Cells Wet Prep HPF POC FEW (A) NONE SEEN   WBC, Wet Prep HPF POC FEW (A) NONE SEEN  CBC with Differential/Platelet     Status: None   Collection Time: 06/09/14 10:15 PM  Result Value Ref Range   WBC 7.4 4.0 - 10.5 K/uL   RBC 4.69 3.87 - 5.11 MIL/uL   Hemoglobin 12.3 12.0 - 15.0 g/dL   HCT 37.2 36.0 - 46.0 %   MCV 79.3 78.0 - 100.0 fL   MCH 26.2 26.0 - 34.0 pg   MCHC 33.1 30.0 - 36.0 g/dL   RDW 14.7 11.5 - 15.5 %   Platelets 221 150 - 400 K/uL   Neutrophils Relative % 54 43 - 77 %   Neutro Abs 3.9 1.7 - 7.7 K/uL   Lymphocytes Relative 35 12 - 46 %   Lymphs Abs 2.6 0.7 - 4.0 K/uL   Monocytes Relative 8 3 - 12 %   Monocytes Absolute 0.6 0.1 - 1.0 K/uL   Eosinophils Relative 3 0 - 5 %   Eosinophils Absolute 0.2 0.0 - 0.7 K/uL   Basophils Relative 0 0 - 1 %   Basophils Absolute 0.0 0.0 - 0.1 K/uL  hCG, quantitative, pregnancy     Status: Abnormal   Collection Time: 06/09/14 10:15 PM  Result Value Ref Range   hCG, Beta Chain, Quant, S 69707 (H) <5 mIU/mL   US Ob Comp Less 14 Wks  06/09/2014   CLINICAL DATA:  Abdominal pain for 1 day. Estimated gestational age by LMP is 5 weeks 5 days. Quantitative beta HCG is pending.  EXAM: OBSTETRIC <14 WK [place] AND TRANSVAGINAL OB US  TECHNIQUE: Both transabdominal and transvaginal ultrasound examinations were performed for complete evaluation of the gestation as well as the maternal uterus, adnexal regions, and pelvic cul-de-sac. Transvaginal technique was performed to assess early pregnancy.  COMPARISON:  None.  FINDINGS: Intrauterine gestational sac: A single intrauterine gestational sac is identified.  Yolk sac:  Present.  Embryo:  Present.  Cardiac Activity: Observed appear  Heart Rate: 121  bpm  CRL:  5  mm   6 w   2  d                  [place] EDC: 01/31/2015  Maternal uterus/adnexae: Uterus is anteverted. Heterogeneous nodular echotexture to the myometrium suggesting an anterior fibroid. Small subchorionic hemorrhage is demonstrated. Both ovaries are visualized and appear normal. Corpus luteum cyst on the right ovary. Small amount of free fluid in  the pelvis.  IMPRESSION: Single intrauterine pregnancy. Estimated gestational age by crown-rump length is 6 weeks 2 days. Small subchorionic hemorrhage noted. Probable fibroid uterus.   Electronically Signed   By: Lucienne Capers M.D.   On: 06/09/2014 23:05   US Ob Transvaginal  06/09/2014   CLINICAL DATA:  Abdominal pain for 1 day. Estimated gestational age by LMP is 5 weeks 5 days. Quantitative beta HCG is pending.  EXAM: OBSTETRIC <14 WK [place] AND TRANSVAGINAL OB US  TECHNIQUE: Both transabdominal and transvaginal ultrasound examinations were performed for complete evaluation of the gestation as well as the maternal uterus, adnexal regions, and pelvic cul-de-sac. Transvaginal technique was performed to assess early pregnancy.  COMPARISON:  None.  FINDINGS: Intrauterine gestational sac: A single intrauterine gestational sac is identified.  Yolk sac:  Present.  Embryo:  Present.  Cardiac Activity: Observed appear  Heart Rate: 121  bpm  CRL:  5  mm   6 w   2 d                  [place] EDC: 01/31/2015  Maternal uterus/adnexae: Uterus is anteverted. Heterogeneous nodular echotexture to the myometrium suggesting an anterior fibroid. Small subchorionic hemorrhage is demonstrated. Both ovaries are visualized and appear normal. Corpus luteum cyst on the right ovary. Small amount of free fluid in the pelvis.  IMPRESSION: Single intrauterine pregnancy. Estimated gestational age by crown-rump length is 6 weeks 2 days. Small subchorionic hemorrhage noted. Probable fibroid uterus.   Electronically Signed   By: Lucienne Capers M.D.   On: 06/09/2014 23:05     MAU Course  Procedures None  MDM +UPT  in the office UA, wet prep, GC/Chlamydia, CBC, Quant hCG, HIV, RPR and [place] today  Assessment and Plan  A: SIUP at [redacted]w[redacted]d with normal cardiac activity Abdominal pain in pregnancy Small subchorionic hemorrhage Bacterial vaginosis Heartburn in pregnancy, first trimester  P: Discharge home Rx for Pepcid and Flagyl given to patient First trimester warning signs dsicussed Patient advised to follow-up with Dr. Jodi Mourning as scheduled to start prenatal care Patient may return to MAU as needed or if her condition were to change or worsen   Luvenia Redden, PA-C  06/09/2014, 11:39 PM

## 2014-06-10 LAB — GC/CHLAMYDIA PROBE AMP (~~LOC~~) NOT AT ARMC
CHLAMYDIA, DNA PROBE: POSITIVE — AB
Neisseria Gonorrhea: NEGATIVE

## 2014-06-11 LAB — RPR: RPR: NONREACTIVE

## 2014-06-11 LAB — HIV ANTIBODY (ROUTINE TESTING W REFLEX): HIV SCREEN 4TH GENERATION: NONREACTIVE

## 2014-06-12 ENCOUNTER — Other Ambulatory Visit: Payer: Self-pay | Admitting: *Deleted

## 2014-06-12 DIAGNOSIS — A749 Chlamydial infection, unspecified: Secondary | ICD-10-CM

## 2014-06-12 MED ORDER — CEFIXIME 400 MG PO TABS: 400.0000 mg | ORAL_TABLET | Freq: Every day | ORAL | Status: DC

## 2014-06-12 MED ORDER — AZITHROMYCIN 500 MG PO TABS: 1000.0000 mg | ORAL_TABLET | Freq: Once | ORAL | Status: DC

## 2014-06-12 NOTE — Progress Notes (Signed)
Patient was recently seen at the hospital. Patient tested positive for Chlamydia. Patient notified of test results and prescriptions sent to the pharmacy. Patient also advised her partner needs to be treated. Patient advised to avoid intercourse for two weeks after both her and her partner are treated. Patient advised health department will be notified.

## 2014-06-16 ENCOUNTER — Encounter (HOSPITAL_COMMUNITY): Payer: Self-pay | Admitting: *Deleted

## 2014-06-16 ENCOUNTER — Inpatient Hospital Stay (HOSPITAL_COMMUNITY)
Admission: AD | Admit: 2014-06-16 | Discharge: 2014-06-16 | Disposition: A | Discharge status: Home / Self Care | Payer: Medicaid Other | Source: Ambulatory Visit | Attending: Obstetrics | Admitting: Obstetrics

## 2014-06-16 DIAGNOSIS — O210 Mild hyperemesis gravidarum: Secondary | ICD-10-CM | POA: Diagnosis not present

## 2014-06-16 DIAGNOSIS — O219 Vomiting of pregnancy, unspecified: Secondary | ICD-10-CM | POA: Diagnosis not present

## 2014-06-16 DIAGNOSIS — Z3A01 Less than 8 weeks gestation of pregnancy: Secondary | ICD-10-CM | POA: Diagnosis not present

## 2014-06-16 DIAGNOSIS — O3421 Maternal care for scar from previous cesarean delivery: Secondary | ICD-10-CM | POA: Insufficient documentation

## 2014-06-16 LAB — URINALYSIS, ROUTINE W REFLEX MICROSCOPIC
Bilirubin Urine: NEGATIVE
GLUCOSE, UA: NEGATIVE mg/dL
HGB URINE DIPSTICK: NEGATIVE
Ketones, ur: NEGATIVE mg/dL
Leukocytes, UA: NEGATIVE
Nitrite: NEGATIVE
Protein, ur: NEGATIVE mg/dL
SPECIFIC GRAVITY, URINE: 1.025 (ref 1.005–1.030)
Urobilinogen, UA: 0.2 mg/dL (ref 0.0–1.0)
pH: 6 (ref 5.0–8.0)

## 2014-06-16 MED ORDER — PROMETHAZINE HCL 12.5 MG PO TABS: 12.5000 mg | ORAL_TABLET | Freq: Four times a day (QID) | ORAL | Status: DC | PRN

## 2014-06-16 NOTE — MAU Provider Note (Signed)
History     CSN: [id number]  Arrival date and time: 06/16/14 1256   First Provider Initiated Contact with Patient 06/16/14 1420      Chief Complaint  Patient presents with  . Emesis During Pregnancy   HPI     Ms.Stephanie Mack is a 24 y.o. female G3P0110 at [redacted]w[redacted]d who presents with Nausea and vomiting. She has been here to MAU and has a confirmed IUP.  She is is scheduled to see Dr. Jodi Mourning April 19th for her first prenatal appointment.  With her last pregnancy she had preeclampsia and had an emergency C-section; she delivered a 25 week fetus that survived three hours.   She was given phenergan however lost the prescription and is not taking it.   OB History    Gravida Para Term Preterm AB TAB SAB Ectopic Multiple Living   3 1 0 1 1 0 1 0 0 0       Past Medical History  Diagnosis Date  . Depression     depression  . Bipolar disorder   . ADHD (attention deficit hyperactivity disorder)   . Anxiety   . Anemia   . ADHD (attention deficit hyperactivity disorder)   . Bipolar 1 disorder   . Chlamydia 05-31-10  . Pseudocyesis 2013    Seen in MAU for percieved FM, abd distension. Normal exam.   . Gonorrhea contact, treated   . Pregnancy induced hypertension     Past Surgical History  Procedure Laterality Date  . Nasal septum surgery    . Cesarean section N/A 09/01/2012    Procedure:  Primary cesarean section with delivery of baby girl at 71.  Apgars 1/1.  ;  Surgeon: Frederico Hamman, MD;  Location: [facility];  Service: Obstetrics;  Laterality: N/A;    Family History  Problem Relation Age of Onset  . Kidney disease Mother   . Hypertension Father   . Cancer Sister   . Asthma Brother   . Heart disease Neg Hx   . Diabetes Maternal Grandfather     History  Substance Use Topics  . Smoking status: Never Smoker   . Smokeless tobacco: Never Used  . Alcohol Use: No    Allergies: No Known Allergies  Prescriptions prior to admission  Medication Sig Dispense Refill  Last Dose  . metroNIDAZOLE (FLAGYL) 500 MG tablet Take 1 tablet (500 mg total) by mouth 2 (two) times daily. 14 tablet 0 06/16/2014 at 1200  . Prenat-Fe Poly-Methfol-FA-DHA (VITAFOL ULTRA) 29-0.6-0.4-200 MG CAPS Take 1 capsule by mouth daily. 30 capsule 11 06/16/2014 at 1200  . azithromycin (ZITHROMAX) 500 MG tablet Take 2 tablets (1,000 mg total) by mouth once. (Patient not taking: Reported on 06/16/2014) 2 tablet 0 Not Taking at Unknown time  . cefixime (SUPRAX) 400 MG tablet Take 1 tablet (400 mg total) by mouth daily. (Patient not taking: Reported on 06/16/2014) 1 tablet 0 Not Taking at Unknown time  . famotidine (PEPCID) 20 MG tablet Take 1 tablet (20 mg total) by mouth 2 (two) times daily. (Patient not taking: Reported on 06/16/2014) 30 tablet 0 Not Taking at Unknown time  . promethazine (PHENERGAN) 25 MG tablet Take 1 tablet (25 mg total) by mouth every 6 (six) hours as needed for nausea or vomiting. (Patient not taking: Reported on 06/04/2014) 30 tablet 0 more than one month  . traMADol (ULTRAM) 50 MG tablet Take 2 tablets (100 mg total) by mouth every 6 (six) hours as needed for moderate pain or severe pain. (Patient  not taking: Reported on 06/04/2014) 40 tablet 2 more than one month    Results for orders placed or performed during the hospital encounter of 06/16/14 (from the past 48 hour(s))  Urinalysis, Routine w reflex microscopic     Status: None   Collection Time: 06/16/14  1:22 PM  Result Value Ref Range   Color, Urine YELLOW YELLOW   APPearance CLEAR CLEAR   Specific Gravity, Urine 1.025 1.005 - 1.030   pH 6.0 5.0 - 8.0   Glucose, UA NEGATIVE NEGATIVE mg/dL   Hgb urine dipstick NEGATIVE NEGATIVE   Bilirubin Urine NEGATIVE NEGATIVE   Ketones, ur NEGATIVE NEGATIVE mg/dL   Protein, ur NEGATIVE NEGATIVE mg/dL   Urobilinogen, UA 0.2 0.0 - 1.0 mg/dL   Nitrite NEGATIVE NEGATIVE   Leukocytes, UA NEGATIVE NEGATIVE    Comment: MICROSCOPIC NOT DONE ON URINES WITH NEGATIVE PROTEIN, BLOOD,  LEUKOCYTES, NITRITE, OR GLUCOSE <1000 mg/dL.    Review of Systems  Constitutional: Negative for fever and chills.  Gastrointestinal: Positive for nausea and vomiting. Negative for abdominal pain.   Physical Exam   Blood pressure 119/75, pulse 86, temperature 98.1 F (36.7 C), temperature source Oral, resp. rate 18, height 5' (1.524 m), weight 56.427 kg (124 lb 6.4 oz), last menstrual period 04/30/2014.  Physical Exam  Constitutional: She is oriented to person, place, and time. She appears well-developed and well-nourished.  Non-toxic appearance. She does not have a sickly appearance. She does not appear ill. No distress.  HENT:  Head: Normocephalic.  Eyes: Pupils are equal, round, and reactive to light.  Neck: Neck supple.  Respiratory: Effort normal.  Musculoskeletal: Normal range of motion.  Neurological: She is alert and oriented to person, place, and time.  Skin: Skin is warm. She is not diaphoretic.  Psychiatric: Her behavior is normal.    MAU Course  Procedures  None  MDM UA shows Mild sign of dehydration.   Assessment and Plan   A:  Nausea and vomiting in early pregnancy   P:  Discharge home in stable condition RX: phenergan Keep your appointment with Dr. Jodi Mourning Return to MAU if symptoms worsen Small, frequent meals.    Lezlie Lye, NP 06/16/2014 2:53 PM

## 2014-06-16 NOTE — MAU Note (Signed)
Pt denies pain or bleeding, states she is taking her pepcid as prescribed.

## 2014-06-16 NOTE — MAU Note (Signed)
Pt states she hasn't felt good since yesterday.  Vomited once today - first time during this pregnancy.  Denies pain.

## 2014-06-16 NOTE — Discharge Instructions (Signed)
Morning Sickness °Morning sickness is when you feel sick to your stomach (nauseous) during pregnancy. You may feel sick to your stomach and throw up (vomit). You may feel sick in the morning, but you can feel this way any time of day. Some women feel very sick to their stomach and cannot stop throwing up (hyperemesis gravidarum). °HOME CARE °· Only take medicines as told by your doctor. °· Take multivitamins as told by your doctor. Taking multivitamins before getting pregnant can stop or lessen the harshness of morning sickness. °· Eat dry toast or unsalted crackers before getting out of bed. °· Eat 5 to 6 small meals a day. °· Eat dry and bland foods like rice and baked potatoes. °· Do not drink liquids with meals. Drink between meals. °· Do not eat greasy, fatty, or spicy foods. °· Have someone cook for you if the smell of food causes you to feel sick or throw up. °· If you feel sick to your stomach after taking prenatal vitamins, take them at night or with a snack. °· Eat protein when you need a snack (nuts, yogurt, cheese). °· Eat unsweetened gelatins for dessert. °· Wear a bracelet used for sea sickness (acupressure wristband). °· Go to a doctor that puts thin needles into certain body points (acupuncture) to improve how you feel. °· Do not smoke. °· Use a humidifier to keep the air in your house free of odors. °· Get lots of fresh air. °GET HELP IF: °· You need medicine to feel better. °· You feel dizzy or lightheaded. °· You are losing weight. °GET HELP RIGHT AWAY IF:  °· You feel very sick to your stomach and cannot stop throwing up. °· You pass out (faint). °MAKE SURE YOU: °· Understand these instructions. °· Will watch your condition. °· Will get help right away if you are not doing well or get worse. °Document Released: 04/28/2004 Document Revised: 03/26/2013 Document Reviewed: 09/05/2012 °Exit[employer]® Patient Information ©2015 [employer]. This information is not intended to replace advice given to you by  your health care provider. Make sure you discuss any questions you have with your health care provider. ° °

## 2014-06-20 ENCOUNTER — Ambulatory Visit: Payer: Medicaid Other | Admitting: Obstetrics

## 2014-06-23 ENCOUNTER — Ambulatory Visit: Payer: Medicaid Other | Admitting: Obstetrics

## 2014-06-24 ENCOUNTER — Ambulatory Visit: Payer: Medicaid Other | Admitting: Obstetrics

## 2014-06-26 ENCOUNTER — Inpatient Hospital Stay (HOSPITAL_COMMUNITY)
Admission: AD | Admit: 2014-06-26 | Discharge: 2014-06-26 | Disposition: A | Discharge status: Home / Self Care | Payer: Medicaid Other | Source: Ambulatory Visit | Attending: Obstetrics | Admitting: Obstetrics

## 2014-06-26 ENCOUNTER — Encounter (HOSPITAL_COMMUNITY): Payer: Self-pay | Admitting: *Deleted

## 2014-06-26 DIAGNOSIS — J069 Acute upper respiratory infection, unspecified: Secondary | ICD-10-CM

## 2014-06-26 DIAGNOSIS — R1031 Right lower quadrant pain: Secondary | ICD-10-CM | POA: Diagnosis present

## 2014-06-26 DIAGNOSIS — O99511 Diseases of the respiratory system complicating pregnancy, first trimester: Secondary | ICD-10-CM | POA: Diagnosis not present

## 2014-06-26 DIAGNOSIS — Z3A08 8 weeks gestation of pregnancy: Secondary | ICD-10-CM | POA: Insufficient documentation

## 2014-06-26 DIAGNOSIS — K5900 Constipation, unspecified: Secondary | ICD-10-CM | POA: Diagnosis not present

## 2014-06-26 DIAGNOSIS — O9989 Other specified diseases and conditions complicating pregnancy, childbirth and the puerperium: Secondary | ICD-10-CM | POA: Insufficient documentation

## 2014-06-26 LAB — URINE MICROSCOPIC-ADD ON

## 2014-06-26 LAB — URINALYSIS, ROUTINE W REFLEX MICROSCOPIC
Bilirubin Urine: NEGATIVE
Glucose, UA: NEGATIVE mg/dL
Hgb urine dipstick: NEGATIVE
Ketones, ur: NEGATIVE mg/dL
Leukocytes, UA: NEGATIVE
Nitrite: NEGATIVE
Protein, ur: 30 mg/dL — AB
SPECIFIC GRAVITY, URINE: 1.02 (ref 1.005–1.030)
UROBILINOGEN UA: 0.2 mg/dL (ref 0.0–1.0)
pH: 7 (ref 5.0–8.0)

## 2014-06-26 MED ORDER — GUAIFENESIN ER 600 MG PO TB12: 600.0000 mg | ORAL_TABLET | Freq: Two times a day (BID) | ORAL | Status: DC

## 2014-06-26 MED ORDER — DOCUSATE SODIUM 100 MG PO CAPS: 100.0000 mg | ORAL_CAPSULE | Freq: Two times a day (BID) | ORAL | Status: DC

## 2014-06-26 MED ORDER — DM-GUAIFENESIN ER 30-600 MG PO TB12
1.0000 | ORAL_TABLET | Freq: Once | ORAL | Status: AC
  Administered 2014-06-26: 1 via ORAL
  Filled 2014-06-26: qty 1

## 2014-06-26 MED ORDER — ACETAMINOPHEN 325 MG PO TABS
650.0000 mg | ORAL_TABLET | Freq: Once | ORAL | Status: AC
  Administered 2014-06-26: 650 mg via ORAL
  Filled 2014-06-26: qty 2

## 2014-06-26 NOTE — MAU Note (Signed)
Pt states here for lower abdominal pain that began today. Denies bleeding or vag d/c changes. Pain is intermittent, feels like cramping.

## 2014-06-26 NOTE — MAU Note (Signed)
Urine in lab 

## 2014-06-26 NOTE — MAU Provider Note (Signed)
History     CSN: [id number]  Arrival date and time: 06/26/14 1315   First Provider Initiated Contact with Patient 06/26/14 1559      Chief Complaint  Patient presents with  . Abdominal Pain   HPIpt is [redacted]w[redacted]d G3P0 and c/o of Right lower quadrant pain.  Pt has also also been constipation- had pain with straining for bowel movement. Pt has not taken anything for the pain. Pt also has had congestion.  With green snotty mucous.   Pt sees Dr. Jodi Mourning for [facility] care. Pt has confirmed viable IUP with small CL on right ovary and probable ant fibroid Pt states she has been constipated- had a hard bowel movement    Pt states here for lower abdominal pain that began today. Denies bleeding or vag d/c changes. Pain is intermittent, feels like cramping.   Past Medical History  Diagnosis Date  . Depression     depression  . Bipolar disorder   . ADHD (attention deficit hyperactivity disorder)   . Anxiety   . Anemia   . ADHD (attention deficit hyperactivity disorder)   . Bipolar 1 disorder   . Chlamydia 05-31-10  . Pseudocyesis 2013    Seen in MAU for percieved FM, abd distension. Normal exam.   . Gonorrhea contact, treated   . Pregnancy induced hypertension     Past Surgical History  Procedure Laterality Date  . Nasal septum surgery    . Cesarean section N/A 09/01/2012    Procedure:  Primary cesarean section with delivery of baby girl at 73.  Apgars 1/1.  ;  Surgeon: Frederico Hamman, MD;  Location: [place] [facility];  Service: Obstetrics;  Laterality: N/A;    Family History  Problem Relation Age of Onset  . Kidney disease Mother   . Hypertension Father   . Cancer Sister   . Asthma Brother   . Heart disease Neg Hx   . Diabetes Maternal Grandfather     History  Substance Use Topics  . Smoking status: Never Smoker   . Smokeless tobacco: Never Used  . Alcohol Use: No    Allergies: No Known Allergies  Prescriptions prior to admission  Medication Sig Dispense Refill Last Dose  .  Prenat-Fe Poly-Methfol-FA-DHA (VITAFOL ULTRA) 29-0.6-0.4-200 MG CAPS Take 1 capsule by mouth daily. 30 capsule 11 06/26/2014 at Unknown time  . promethazine (PHENERGAN) 12.5 MG tablet Take 1 tablet (12.5 mg total) by mouth every 6 (six) hours as needed for nausea or vomiting. 30 tablet 0 06/26/2014 at Unknown time  . azithromycin (ZITHROMAX) 500 MG tablet Take 2 tablets (1,000 mg total) by mouth once. (Patient not taking: Reported on 06/16/2014) 2 tablet 0 Completed Course at Unknown time  . metroNIDAZOLE (FLAGYL) 500 MG tablet Take 1 tablet (500 mg total) by mouth 2 (two) times daily. (Patient not taking: Reported on 06/26/2014) 14 tablet 0 Not Taking at Unknown time    Review of Systems  Constitutional: Negative for fever and chills.  Gastrointestinal: Positive for nausea, abdominal pain and constipation.  Genitourinary: Negative for dysuria and urgency.   Physical Exam   Blood pressure 131/72, pulse 77, temperature 98 F (36.7 C), temperature source Oral, resp. rate 18, height 5' (1.524 m), weight 124 lb 6 oz (56.416 kg), last menstrual period 04/30/2014.  Physical Exam  Vitals reviewed. Constitutional: She is oriented to person, place, and time. She appears well-developed and well-nourished.  HENT:  Head: Normocephalic.  Mouth/Throat: Oropharynx is clear and moist.  Eyes: Pupils are equal, round,  and reactive to light.  Neck: Normal range of motion. Neck supple.  Cardiovascular: Normal rate.   Respiratory: Breath sounds normal. She is in respiratory distress. She has no wheezes.  GI: Soft. She exhibits no distension. There is tenderness. There is no rebound and no guarding.  mildy tender with palpation RLQ  Musculoskeletal: Normal range of motion.  Neurological: She is alert and oriented to person, place, and time.  Skin: Skin is warm and dry.  Psychiatric: She has a normal mood and affect.    MAU Course  Procedures  Results for orders placed or performed during the hospital  encounter of 06/26/14 (from the past 24 hour(s))  Urinalysis, Routine w reflex microscopic     Status: Abnormal   Collection Time: 06/26/14  3:10 PM  Result Value Ref Range   Color, Urine YELLOW YELLOW   APPearance CLEAR CLEAR   Specific Gravity, Urine 1.020 1.005 - 1.030   pH 7.0 5.0 - 8.0   Glucose, UA NEGATIVE NEGATIVE mg/dL   Hgb urine dipstick NEGATIVE NEGATIVE   Bilirubin Urine NEGATIVE NEGATIVE   Ketones, ur NEGATIVE NEGATIVE mg/dL   Protein, ur 30 (A) NEGATIVE mg/dL   Urobilinogen, UA 0.2 0.0 - 1.0 mg/dL   Nitrite NEGATIVE NEGATIVE   Leukocytes, UA NEGATIVE NEGATIVE  Urine microscopic-add on     Status: Abnormal   Collection Time: 06/26/14  3:10 PM  Result Value Ref Range   Squamous Epithelial / LPF FEW (A) RARE   WBC, UA 0-2 <3 WBC/hpf   RBC / HPF 0-2 <3 RBC/hpf   Bacteria, UA FEW (A) RARE   Urine-Other MUCOUS PRESENT      Assessment and Plan  URI in pregnancy- Mucinex; list of meds and URI comfort measures given to pt Constipation- colace F/u with Dr. Jodi Mourning for OB  Stephanie Mack 06/26/2014, 4:00 PM

## 2014-07-02 ENCOUNTER — Telehealth: Payer: Self-pay | Admitting: *Deleted

## 2014-07-02 ENCOUNTER — Inpatient Hospital Stay (HOSPITAL_COMMUNITY)
Admission: AD | Admit: 2014-07-02 | Discharge: 2014-07-02 | Disposition: A | Discharge status: Home / Self Care | Payer: Medicaid Other | Source: Ambulatory Visit | Attending: Obstetrics | Admitting: Obstetrics

## 2014-07-02 ENCOUNTER — Encounter (HOSPITAL_COMMUNITY): Payer: Self-pay | Admitting: *Deleted

## 2014-07-02 DIAGNOSIS — Z3A09 9 weeks gestation of pregnancy: Secondary | ICD-10-CM | POA: Diagnosis not present

## 2014-07-02 DIAGNOSIS — O26891 Other specified pregnancy related conditions, first trimester: Secondary | ICD-10-CM

## 2014-07-02 DIAGNOSIS — R51 Headache: Secondary | ICD-10-CM | POA: Diagnosis present

## 2014-07-02 DIAGNOSIS — G43909 Migraine, unspecified, not intractable, without status migrainosus: Secondary | ICD-10-CM | POA: Diagnosis not present

## 2014-07-02 DIAGNOSIS — O9989 Other specified diseases and conditions complicating pregnancy, childbirth and the puerperium: Secondary | ICD-10-CM | POA: Diagnosis not present

## 2014-07-02 DIAGNOSIS — G43009 Migraine without aura, not intractable, without status migrainosus: Secondary | ICD-10-CM | POA: Diagnosis not present

## 2014-07-02 LAB — URINALYSIS, ROUTINE W REFLEX MICROSCOPIC
Bilirubin Urine: NEGATIVE
Glucose, UA: NEGATIVE mg/dL
Hgb urine dipstick: NEGATIVE
KETONES UR: NEGATIVE mg/dL
Leukocytes, UA: NEGATIVE
NITRITE: NEGATIVE
PROTEIN: 30 mg/dL — AB
SPECIFIC GRAVITY, URINE: 1.025 (ref 1.005–1.030)
UROBILINOGEN UA: 0.2 mg/dL (ref 0.0–1.0)
pH: 6.5 (ref 5.0–8.0)

## 2014-07-02 LAB — URINE MICROSCOPIC-ADD ON

## 2014-07-02 MED ORDER — CYCLOBENZAPRINE HCL 10 MG PO TABS: 10.0000 mg | ORAL_TABLET | Freq: Three times a day (TID) | ORAL | Status: DC | PRN

## 2014-07-02 NOTE — Discharge Instructions (Signed)
Use Flexeril - 1/2 to a whole tablet as needed for headache up to 3/day You may also use over the counter Tylenol up to 1000mg  at a time, no more than 3000mg  per day   Migraine Headache A migraine headache is an intense, throbbing pain on one or both sides of your head. A migraine can last for 30 minutes to several hours. CAUSES  The exact cause of a migraine headache is not always known. However, a migraine may be caused when nerves in the brain become irritated and release chemicals that cause inflammation. This causes pain. Certain things may also trigger migraines, such as:  Alcohol.  Smoking.  Stress.  Menstruation.  Aged cheeses.  Foods or drinks that contain nitrates, glutamate, aspartame, or tyramine.  Lack of sleep.  Chocolate.  Caffeine.  Hunger.  Physical exertion.  Fatigue.  Medicines used to treat chest pain (nitroglycerine), birth control pills, estrogen, and some blood pressure medicines. SIGNS AND SYMPTOMS  Pain on one or both sides of your head.  Pulsating or throbbing pain.  Severe pain that prevents daily activities.  Pain that is aggravated by any physical activity.  Nausea, vomiting, or both.  Dizziness.  Pain with exposure to bright lights, loud noises, or activity.  General sensitivity to bright lights, loud noises, or smells. Before you get a migraine, you may get warning signs that a migraine is coming (aura). An aura may include:  Seeing flashing lights.  Seeing bright spots, halos, or zigzag lines.  Having tunnel vision or blurred vision.  Having feelings of numbness or tingling.  Having trouble talking.  Having muscle weakness. DIAGNOSIS  A migraine headache is often diagnosed based on:  Symptoms.  Physical exam.  A CT scan or MRI of your head. These imaging tests cannot diagnose migraines, but they can help rule out other causes of headaches. TREATMENT Medicines may be given for pain and nausea. Medicines can also  be given to help prevent recurrent migraines.  HOME CARE INSTRUCTIONS  Only take over-the-counter or prescription medicines for pain or discomfort as directed by your health care provider. The use of long-term narcotics is not recommended.  Lie down in a dark, quiet room when you have a migraine.  Keep a journal to find out what may trigger your migraine headaches. For example, write down:  What you eat and drink.  How much sleep you get.  Any change to your diet or medicines.  Limit alcohol consumption.  Quit smoking if you smoke.  Get 7-9 hours of sleep, or as recommended by your health care provider.  Limit stress.  Keep lights dim if bright lights bother you and make your migraines worse. SEEK IMMEDIATE MEDICAL CARE IF:   Your migraine becomes severe.  You have a fever.  You have a stiff neck.  You have vision loss.  You have muscular weakness or loss of muscle control.  You start losing your balance or have trouble walking.  You feel faint or pass out.  You have severe symptoms that are different from your first symptoms. MAKE SURE YOU:   Understand these instructions.  Will watch your condition.  Will get help right away if you are not doing well or get worse. Document Released: 03/21/2005 Document Revised: 08/05/2013 Document Reviewed: 11/26/2012 [facility] Patient Information 2015 [place], [place]. This information is not intended to replace advice given to you by your health care provider. Make sure you discuss any questions you have with your health care provider.

## 2014-07-02 NOTE — Telephone Encounter (Signed)
Patient calling - urgent Patient states she has had a headache for 3 days. She is pregnant with nausea. Patient advised to go to MAU so she can get IV fluids and something to help her headache.

## 2014-07-02 NOTE — MAU Provider Note (Signed)
History     CSN: [id number]  Arrival date and time: 07/02/14 1238   First Provider Initiated Contact with Patient 07/02/14 1517      Chief Complaint  Patient presents with  . Headache   HPI Stephanie Mack 24 y.o. [id number] @[redacted]w[redacted]d  presents to MAU with HA x 3 days.  She has used one Tylenol for treatment of HA and this has not been helpful.   The pain is 7/10, bilat frontal and over nasal bone, sharp, stabbing.  It is increased with movement, associated with photophobia and phonophobia as well as nausea.  She has not had vomiting, vaginal bleeding, discharge, dysuria, fever, abdominal pain or back pain.   OB History    Gravida Para Term Preterm AB TAB SAB Ectopic Multiple Living   3 1 0 1 1 0 1 0 0 0       Past Medical History  Diagnosis Date  . Depression     depression  . Bipolar disorder   . ADHD (attention deficit hyperactivity disorder)   . Anxiety   . Anemia   . ADHD (attention deficit hyperactivity disorder)   . Bipolar 1 disorder   . Chlamydia 05-31-10  . Pseudocyesis 2013    Seen in MAU for percieved FM, abd distension. Normal exam.   . Gonorrhea contact, treated   . Pregnancy induced hypertension     Past Surgical History  Procedure Laterality Date  . Nasal septum surgery    . Cesarean section N/A 09/01/2012    Procedure:  Primary cesarean section with delivery of baby girl at 58.  Apgars 1/1.  ;  Surgeon: Frederico Hamman, MD;  Location: [facility];  Service: Obstetrics;  Laterality: N/A;    Family History  Problem Relation Age of Onset  . Kidney disease Mother   . Hypertension Father   . Cancer Sister   . Asthma Brother   . Heart disease Neg Hx   . Diabetes Maternal Grandfather     History  Substance Use Topics  . Smoking status: Never Smoker   . Smokeless tobacco: Never Used  . Alcohol Use: No    Allergies: No Known Allergies  Prescriptions prior to admission  Medication Sig Dispense Refill Last Dose  . Prenatal Vit-Fe Fumarate-FA (PRENATAL  MULTIVITAMIN) TABS tablet Take 1 tablet by mouth daily at 12 noon.   07/02/2014 at Unknown time  . promethazine (PHENERGAN) 12.5 MG tablet Take 1 tablet (12.5 mg total) by mouth every 6 (six) hours as needed for nausea or vomiting. 30 tablet 0 Past Week at Unknown time  . azithromycin (ZITHROMAX) 500 MG tablet Take 2 tablets (1,000 mg total) by mouth once. (Patient not taking: Reported on 07/02/2014) 2 tablet 0 Completed Course at Unknown time  . docusate sodium (COLACE) 100 MG capsule Take 1 capsule (100 mg total) by mouth every 12 (twelve) hours. (Patient not taking: Reported on 07/02/2014) 60 capsule 0   . guaiFENesin (MUCINEX) 600 MG 12 hr tablet Take 1 tablet (600 mg total) by mouth 2 (two) times daily. (Patient not taking: Reported on 07/02/2014) 40 tablet 0   . metroNIDAZOLE (FLAGYL) 500 MG tablet Take 1 tablet (500 mg total) by mouth 2 (two) times daily. (Patient not taking: Reported on 06/26/2014) 14 tablet 0 Not Taking at Unknown time  . Prenat-Fe Poly-Methfol-FA-DHA (VITAFOL ULTRA) 29-0.6-0.4-200 MG CAPS Take 1 capsule by mouth daily. (Patient not taking: Reported on 07/02/2014) 30 capsule 11 06/26/2014 at Unknown time    ROS Pertinent ROS in HPI  Physical Exam   Blood pressure 129/80, pulse 80, temperature 98 F (36.7 C), temperature source Oral, resp. rate 18, height 5' 1.5" (1.562 m), weight 127 lb (57.607 kg), last menstrual period 04/30/2014.  Physical Exam  Constitutional: She is oriented to person, place, and time. She appears well-developed and well-nourished. No distress.  HENT:  Head: Normocephalic and atraumatic.  Eyes: EOM are normal.  Neck: Normal range of motion.  Cardiovascular: Normal rate, regular rhythm and normal heart sounds.   Respiratory: Effort normal and breath sounds normal. No respiratory distress.  GI: Soft. Bowel sounds are normal. She exhibits no distension and no mass. There is no tenderness. There is no rebound and no guarding.  Musculoskeletal: Normal  range of motion.  Neurological: She is alert and oriented to person, place, and time. No cranial nerve deficit. She exhibits normal muscle tone. Coordination normal.  Skin: Skin is warm and dry.  Psychiatric: She has a normal mood and affect.   Results for orders placed or performed during the hospital encounter of 07/02/14 (from the past 24 hour(s))  Urinalysis, Routine w reflex microscopic     Status: Abnormal   Collection Time: 07/02/14  1:00 PM  Result Value Ref Range   Color, Urine YELLOW YELLOW   APPearance CLEAR CLEAR   Specific Gravity, Urine 1.025 1.005 - 1.030   pH 6.5 5.0 - 8.0   Glucose, UA NEGATIVE NEGATIVE mg/dL   Hgb urine dipstick NEGATIVE NEGATIVE   Bilirubin Urine NEGATIVE NEGATIVE   Ketones, ur NEGATIVE NEGATIVE mg/dL   Protein, ur 30 (A) NEGATIVE mg/dL   Urobilinogen, UA 0.2 0.0 - 1.0 mg/dL   Nitrite NEGATIVE NEGATIVE   Leukocytes, UA NEGATIVE NEGATIVE  Urine microscopic-add on     Status: Abnormal   Collection Time: 07/02/14  1:00 PM  Result Value Ref Range   Squamous Epithelial / LPF FEW (A) RARE   WBC, UA 0-2 <3 WBC/hpf    MAU Course  Procedures  MDM Migraine HA without aura.  Pt has only been using 325mg  of tylenol for HA.  This is insufficient treatment.  She is offered injectable medication in MAU or rx for oral medication to use at home.  She and her grandmother agree for oral rx.  Assessment and Plan  A: Migraine HA in pregnancy  P: Discharge to home Flexeril rx Okay to use OTC Tylenol up to 1000mg  at once.  No more than 3000mg /day F/u with [facility] asap Continue PNV Encourage good PO hydration Patient may return to MAU as needed or if her condition were to change or worsen   Stephanie Mack 07/02/2014, 3:19 PM

## 2014-07-02 NOTE — MAU Note (Signed)
Has been having headaches for 3 days, took some tylenol and it is not helping, called MD and was told to come here.

## 2014-07-10 ENCOUNTER — Telehealth: Payer: Self-pay | Admitting: *Deleted

## 2014-07-10 NOTE — Telephone Encounter (Signed)
Patient c/o sinus congestion and wants to know what to take. 3:26 Patient advised she may use OTC sinus medicationto treat her allergy- Allegra is OK.

## 2014-07-22 ENCOUNTER — Encounter: Payer: Self-pay | Admitting: Obstetrics

## 2014-07-22 ENCOUNTER — Ambulatory Visit (INDEPENDENT_AMBULATORY_CARE_PROVIDER_SITE_OTHER): Payer: Medicaid Other | Admitting: Obstetrics

## 2014-07-22 VITALS — BP 135/80 | HR 112 | Temp 98.4°F | Wt 126.0 lb

## 2014-07-22 DIAGNOSIS — O2690 Pregnancy related conditions, unspecified, unspecified trimester: Secondary | ICD-10-CM | POA: Diagnosis not present

## 2014-07-22 DIAGNOSIS — O09292 Supervision of pregnancy with other poor reproductive or obstetric history, second trimester: Secondary | ICD-10-CM

## 2014-07-22 DIAGNOSIS — K5900 Constipation, unspecified: Secondary | ICD-10-CM

## 2014-07-22 DIAGNOSIS — Z3481 Encounter for supervision of other normal pregnancy, first trimester: Secondary | ICD-10-CM | POA: Diagnosis not present

## 2014-07-22 LAB — POCT URINALYSIS DIPSTICK
BILIRUBIN UA: NEGATIVE
Glucose, UA: NEGATIVE
KETONES UA: NEGATIVE
NITRITE UA: NEGATIVE
Spec Grav, UA: 1.02
Urobilinogen, UA: NEGATIVE
pH, UA: 6

## 2014-07-22 MED ORDER — DOCUSATE SODIUM 100 MG PO CAPS: 100.0000 mg | ORAL_CAPSULE | Freq: Two times a day (BID) | ORAL | Status: DC

## 2014-07-22 MED ORDER — ASPIRIN EC 81 MG PO TBEC: 81.0000 mg | DELAYED_RELEASE_TABLET | Freq: Every day | ORAL | Status: DC

## 2014-07-22 NOTE — Progress Notes (Signed)
Subjective:    Stephanie Mack is being seen today for her first obstetrical visit.  This is not a planned pregnancy. She is at [redacted]w[redacted]d gestation. Her obstetrical history is significant for pre-eclampsia. Relationship with FOB: significant other, not living together. Patient does intend to breast feed. Pregnancy history fully reviewed.  The information documented in the HPI was reviewed and verified.  Menstrual History: OB History    Gravida Para Term Preterm AB TAB SAB Ectopic Multiple Living   3 1 0 1 1 0 1 0 0 0      Patient's last menstrual period was 04/30/2014.    Past Medical History  Diagnosis Date  . Depression     depression  . Bipolar disorder   . ADHD (attention deficit hyperactivity disorder)   . Anxiety   . Anemia   . ADHD (attention deficit hyperactivity disorder)   . Bipolar 1 disorder   . Chlamydia 05-31-10  . Pseudocyesis 2013    Seen in MAU for percieved FM, abd distension. Normal exam.   . Gonorrhea contact, treated   . Pregnancy induced hypertension     Past Surgical History  Procedure Laterality Date  . Nasal septum surgery    . Cesarean section N/A 09/01/2012    Procedure:  Primary cesarean section with delivery of baby girl at 94.  Apgars 1/1.  ;  Surgeon: Frederico Hamman, MD;  Location: [facility] [facility];  Service: Obstetrics;  Laterality: N/A;     (Not in a hospital admission) No Known Allergies  History  Substance Use Topics  . Smoking status: Never Smoker   . Smokeless tobacco: Never Used  . Alcohol Use: No    Family History  Problem Relation Age of Onset  . Kidney disease Mother   . Hypertension Father   . Cancer Sister   . Asthma Brother   . Heart disease Neg Hx   . Diabetes Maternal Grandfather      Review of Systems Constitutional: negative for weight loss Gastrointestinal: negative for vomiting Genitourinary:negative for genital lesions and vaginal discharge and dysuria Musculoskeletal:negative for back pain Behavioral/Psych: negative  for abusive relationship, depression, illegal drug usage and tobacco use    Objective:    BP 135/80 mmHg  Pulse 112  Temp(Src) 98.4 F (36.9 C)  Wt 126 lb (57.153 kg)  LMP 04/30/2014 General Appearance:    Alert, cooperative, no distress, appears stated age  Head:    Normocephalic, without obvious abnormality, atraumatic  Eyes:    PERRL, conjunctiva/corneas clear, EOM's intact, fundi    benign, both eyes  Ears:    Normal TM's and external ear canals, both ears  Nose:   Nares normal, septum midline, mucosa normal, no drainage    or sinus tenderness  Throat:   Lips, mucosa, and tongue normal; teeth and gums normal  Neck:   Supple, symmetrical, trachea midline, no adenopathy;    thyroid:  no enlargement/tenderness/nodules; no carotid   bruit or JVD  Back:     Symmetric, no curvature, ROM normal, no CVA tenderness  Lungs:     Clear to auscultation bilaterally, respirations unlabored  Chest Wall:    No tenderness or deformity   Heart:    Regular rate and rhythm, S1 and S2 normal, no murmur, rub   or gallop  Breast Exam:    No tenderness, masses, or nipple abnormality  Abdomen:     Soft, non-tender, bowel sounds active all four quadrants,    no masses, no organomegaly  Genitalia:  Normal female without lesion, discharge or tenderness  Extremities:   Extremities normal, atraumatic, no cyanosis or edema  Pulses:   2+ and symmetric all extremities  Skin:   Skin color, texture, turgor normal, no rashes or lesions  Lymph nodes:   Cervical, supraclavicular, and axillary nodes normal  Neurologic:   CNII-XII intact, normal strength, sensation and reflexes    throughout      Lab Review Urine pregnancy test Labs reviewed yes Radiologic studies reviewed yes Assessment:    Pregnancy at [redacted]w[redacted]d weeks    Poor OB History with H/O Severe Preeclampsia with IUGR, with C/S at 25 weeks and neonatal demise   Plan:    Ultrasound ordered for nuchal translucency Referred to MFM Baby ASA  started  Prenatal vitamins.  Counseling provided regarding continued use of seat belts, cessation of alcohol consumption, smoking or use of illicit drugs; infection precautions i.e., influenza/TDAP immunizations, toxoplasmosis,CMV, parvovirus, listeria and varicella; workplace safety, exercise during pregnancy; routine dental care, safe medications, sexual activity, hot tubs, saunas, pools, travel, caffeine use, fish and methlymercury, potential toxins, hair treatments, varicose veins Weight gain recommendations per IOM guidelines reviewed: underweight/BMI< 18.5--> gain 28 - 40 lbs; normal weight/BMI 18.5 - 24.9--> gain 25 - 35 lbs; overweight/BMI 25 - 29.9--> gain 15 - 25 lbs; obese/BMI >30->gain  11 - 20 lbs Problem list reviewed and updated. FIRST/CF mutation testing/NIPT/QUAD SCREEN/fragile X/[name] population testing/Spinal muscular atrophy discussed: requested. Role of ultrasound in pregnancy discussed; fetal survey: requested. Amniocentesis discussed: not indicated. VBAC calculator score: VBAC consent form provided No orders of the defined types were placed in this encounter.   Orders Placed This Encounter  Procedures  . Culture, OB Urine  . Obstetric panel  . HIV antibody  . Hemoglobinopathy evaluation  . Varicella zoster antibody, IgG  . Vit D  25 hydroxy (rtn osteoporosis monitoring)  . TSH  . POCT urinalysis dipstick    Follow up in 4 weeks.

## 2014-07-22 NOTE — Patient Instructions (Signed)

## 2014-07-23 ENCOUNTER — Other Ambulatory Visit: Payer: Self-pay | Admitting: *Deleted

## 2014-07-23 ENCOUNTER — Other Ambulatory Visit: Payer: Medicaid Other

## 2014-07-23 DIAGNOSIS — Z3401 Encounter for supervision of normal first pregnancy, first trimester: Secondary | ICD-10-CM

## 2014-07-23 DIAGNOSIS — O09292 Supervision of pregnancy with other poor reproductive or obstetric history, second trimester: Secondary | ICD-10-CM

## 2014-07-23 LAB — VARICELLA ZOSTER ANTIBODY, IGG: Varicella IgG: 1400 Index — ABNORMAL HIGH (ref <135.00)

## 2014-07-23 LAB — OBSTETRIC PANEL
ANTIBODY SCREEN: NEGATIVE
Basophils Absolute: 0 10*3/uL (ref 0.0–0.1)
Basophils Relative: 0 % (ref 0–1)
Eosinophils Absolute: 0.1 10*3/uL (ref 0.0–0.7)
Eosinophils Relative: 2 % (ref 0–5)
HCT: 34.8 % — ABNORMAL LOW (ref 36.0–46.0)
Hemoglobin: 11.7 g/dL — ABNORMAL LOW (ref 12.0–15.0)
Hepatitis B Surface Ag: NEGATIVE
Lymphocytes Relative: 30 % (ref 12–46)
Lymphs Abs: 2.2 10*3/uL (ref 0.7–4.0)
MCH: 25.7 pg — ABNORMAL LOW (ref 26.0–34.0)
MCHC: 33.6 g/dL (ref 30.0–36.0)
MCV: 76.5 fL — AB (ref 78.0–100.0)
MONO ABS: 0.5 10*3/uL (ref 0.1–1.0)
MONOS PCT: 7 % (ref 3–12)
MPV: 10.7 fL (ref 8.6–12.4)
Neutro Abs: 4.5 10*3/uL (ref 1.7–7.7)
Neutrophils Relative %: 61 % (ref 43–77)
Platelets: 247 10*3/uL (ref 150–400)
RBC: 4.55 MIL/uL (ref 3.87–5.11)
RDW: 15.5 % (ref 11.5–15.5)
RH TYPE: NEGATIVE
Rubella: 0.56 Index (ref <0.90)
WBC: 7.3 10*3/uL (ref 4.0–10.5)

## 2014-07-23 LAB — HIV ANTIBODY (ROUTINE TESTING W REFLEX): HIV 1&2 Ab, 4th Generation: NONREACTIVE

## 2014-07-23 LAB — VITAMIN D 25 HYDROXY (VIT D DEFICIENCY, FRACTURES): Vit D, 25-Hydroxy: 25 ng/mL — ABNORMAL LOW (ref 30–100)

## 2014-07-23 LAB — TSH: TSH: 1.067 u[IU]/mL (ref 0.350–4.500)

## 2014-07-24 LAB — CREATININE CLEARANCE, URINE, 24 HOUR
CREATININE: 0.51 mg/dL (ref 0.50–1.10)
Creatinine Clearance: 162 mL/min — ABNORMAL HIGH (ref 75–115)
Creatinine, 24H Ur: 1190 mg/d (ref 700–1800)
Creatinine, Urine: 79.3 mg/dL

## 2014-07-24 LAB — PAP IG W/ RFLX HPV ASCU

## 2014-07-24 LAB — HEMOGLOBINOPATHY EVALUATION
HGB A2 QUANT: 2.6 % (ref 2.2–3.2)
HGB A: 97 % (ref 96.8–97.8)
Hemoglobin Other: 0 %
Hgb F Quant: 0.4 % (ref 0.0–2.0)
Hgb S Quant: 0 %

## 2014-07-24 LAB — CULTURE, OB URINE: Colony Count: 70000

## 2014-07-24 LAB — PROTEIN, URINE, 24 HOUR
Protein, 24H Urine: 270 mg/d — ABNORMAL HIGH (ref <150)
Protein, Urine: 18 mg/dL (ref 5–24)

## 2014-07-25 ENCOUNTER — Other Ambulatory Visit: Payer: Self-pay | Admitting: Obstetrics

## 2014-07-25 DIAGNOSIS — B373 Candidiasis of vulva and vagina: Secondary | ICD-10-CM

## 2014-07-25 DIAGNOSIS — B3731 Acute candidiasis of vulva and vagina: Secondary | ICD-10-CM

## 2014-07-25 LAB — SURESWAB, VAGINOSIS/VAGINITIS PLUS
ATOPOBIUM VAGINAE: NOT DETECTED Log (cells/mL)
C. ALBICANS, DNA: DETECTED — AB
C. PARAPSILOSIS, DNA: NOT DETECTED
C. TRACHOMATIS RNA, TMA: NOT DETECTED
C. glabrata, DNA: NOT DETECTED
C. tropicalis, DNA: NOT DETECTED
GARDNERELLA VAGINALIS: 5.6 Log (cells/mL)
LACTOBACILLUS SPECIES: 7.4 Log (cells/mL)
MEGASPHAERA SPECIES: NOT DETECTED Log (cells/mL)
N. gonorrhoeae RNA, TMA: NOT DETECTED
T. VAGINALIS RNA, QL TMA: NOT DETECTED

## 2014-07-25 MED ORDER — FLUCONAZOLE 150 MG PO TABS: 150.0000 mg | ORAL_TABLET | Freq: Once | ORAL | Status: DC

## 2014-07-29 ENCOUNTER — Ambulatory Visit (HOSPITAL_COMMUNITY)
Admission: RE | Admit: 2014-07-29 | Discharge: 2014-07-29 | Disposition: A | Discharge status: Home / Self Care | Payer: Medicaid Other | Source: Ambulatory Visit | Attending: Obstetrics | Admitting: Obstetrics

## 2014-07-29 ENCOUNTER — Encounter (HOSPITAL_COMMUNITY): Payer: Self-pay

## 2014-07-29 ENCOUNTER — Other Ambulatory Visit (HOSPITAL_COMMUNITY): Payer: Self-pay

## 2014-07-29 DIAGNOSIS — O09292 Supervision of pregnancy with other poor reproductive or obstetric history, second trimester: Secondary | ICD-10-CM

## 2014-07-29 DIAGNOSIS — O09291 Supervision of pregnancy with other poor reproductive or obstetric history, first trimester: Secondary | ICD-10-CM | POA: Insufficient documentation

## 2014-07-29 DIAGNOSIS — Z3A12 12 weeks gestation of pregnancy: Secondary | ICD-10-CM | POA: Diagnosis not present

## 2014-07-29 DIAGNOSIS — Z36 Encounter for antenatal screening of mother: Secondary | ICD-10-CM | POA: Insufficient documentation

## 2014-07-29 DIAGNOSIS — O09299 Supervision of pregnancy with other poor reproductive or obstetric history, unspecified trimester: Secondary | ICD-10-CM | POA: Insufficient documentation

## 2014-07-29 DIAGNOSIS — O09211 Supervision of pregnancy with history of pre-term labor, first trimester: Secondary | ICD-10-CM | POA: Diagnosis not present

## 2014-07-29 DIAGNOSIS — Z3682 Encounter for antenatal screening for nuchal translucency: Secondary | ICD-10-CM | POA: Insufficient documentation

## 2014-07-29 LAB — COMPREHENSIVE METABOLIC PANEL
ALT: 31 U/L (ref 0–35)
AST: 25 U/L (ref 0–37)
Albumin: 3.5 g/dL (ref 3.5–5.2)
Alkaline Phosphatase: 42 U/L (ref 39–117)
Anion gap: 7 (ref 5–15)
BUN: 7 mg/dL (ref 6–23)
CO2: 24 mmol/L (ref 19–32)
CREATININE: 0.45 mg/dL — AB (ref 0.50–1.10)
Calcium: 9.1 mg/dL (ref 8.4–10.5)
Chloride: 103 mmol/L (ref 96–112)
GFR calc non Af Amer: >90 mL/min (ref >90)
GLUCOSE: 98 mg/dL (ref 70–99)
Potassium: 3.6 mmol/L (ref 3.5–5.1)
SODIUM: 134 mmol/L — AB (ref 135–145)
Total Bilirubin: 0.4 mg/dL (ref 0.3–1.2)
Total Protein: 7 g/dL (ref 6.0–8.3)

## 2014-07-29 LAB — CBC
HCT: 34.4 % — ABNORMAL LOW (ref 36.0–46.0)
Hemoglobin: 11.4 g/dL — ABNORMAL LOW (ref 12.0–15.0)
MCH: 25.7 pg — AB (ref 26.0–34.0)
MCHC: 33.1 g/dL (ref 30.0–36.0)
MCV: 77.7 fL — AB (ref 78.0–100.0)
Platelets: 185 10*3/uL (ref 150–400)
RBC: 4.43 MIL/uL (ref 3.87–5.11)
RDW: 14.5 % (ref 11.5–15.5)
WBC: 6.3 10*3/uL (ref 4.0–10.5)

## 2014-07-29 LAB — URIC ACID: Uric Acid, Serum: 3.1 mg/dL (ref 2.4–7.0)

## 2014-07-29 NOTE — Consult Note (Signed)
Maternal Fetal Medicine Consultation  Requesting Provider(s): Baltazar Najjar, MD  Reason for consultation: Hx of severe IUGR, preeclampsia with severe features s/p delivery at 25 weeks  HPI: Stephanie Mack  Is a 24 yo G3P0110, EDD 02/04/2015 who is currently at 12w 6d seen for consultation due to a history of severe IUGR and preeclampsia with severe features requiring delivery at [redacted] weeks gestation.  The infant's birthweight was < 400 g and expired shortly after birth. The patient underwent a classical uterine incision for delivery.  Stephanie Mack denies any history of chronic hypertension or other medical illnesses. She is without complaints today.  OB History: OB History    Gravida Para Term Preterm AB TAB SAB Ectopic Multiple Living   3 1 0 1 1 0 1 0 0 0       PMH:  Past Medical History  Diagnosis Date  . Depression     depression  . Bipolar disorder   . ADHD (attention deficit hyperactivity disorder)   . Anxiety   . Anemia   . ADHD (attention deficit hyperactivity disorder)   . Bipolar 1 disorder   . Chlamydia 05-31-10  . Pseudocyesis 2013    Seen in [facility]U for percieved FM, abd distension. Normal exam.   . Gonorrhea contact, treated   . Pregnancy induced hypertension     PSH:  Past Surgical History  Procedure Laterality Date  . Nasal septum surgery    . Cesarean section N/A 09/01/2012    Procedure:  Primary cesarean section with delivery of baby girl at 66.  Apgars 1/1.  ;  Surgeon: Frederico Hamman, MD;  Location: [facility];  Service: Obstetrics;  Laterality: N/A;   Meds:  Current Outpatient Prescriptions on File Prior to Encounter  Medication Sig Dispense Refill  . aspirin EC 81 MG tablet Take 1 tablet (81 mg total) by mouth daily. 90 tablet 5  . cyclobenzaprine (FLEXERIL) 10 MG tablet Take 1 tablet (10 mg total) by mouth 3 (three) times daily as needed for muscle spasms. (Patient not taking: Reported on 07/22/2014) 30 tablet 0  . docusate sodium (COLACE) 100 MG capsule  Take 1 capsule (100 mg total) by mouth 2 (two) times daily. 60 capsule 11  . fluconazole (DIFLUCAN) 150 MG tablet Take 1 tablet (150 mg total) by mouth once. 1 tablet 2  . Prenatal Vit-Fe Fumarate-FA (PRENATAL MULTIVITAMIN) TABS tablet Take 1 tablet by mouth daily at 12 noon.    . promethazine (PHENERGAN) 12.5 MG tablet Take 1 tablet (12.5 mg total) by mouth every 6 (six) hours as needed for nausea or vomiting. (Patient not taking: Reported on 07/22/2014) 30 tablet 0   No current facility-administered medications on file prior to encounter.   Allergies: No Known Allergies   FH:  Family History  Problem Relation Age of Onset  . Kidney disease Mother   . Hypertension Father   . Cancer Sister   . Asthma Brother   . Heart disease Neg Hx   . Diabetes Maternal Grandfather   Denies any family history of birth defects or hereditary disorders  Soc:  History   Social History  . Marital Status: Single    Spouse Name: N/A  . Number of Children: N/A  . Years of Education: N/A   Occupational History  . Not on file.   Social History Main Topics  . Smoking status: Never Smoker   . Smokeless tobacco: Never Used  . Alcohol Use: No  . Drug Use: No  . Sexual Activity:  Partners: Male    Museum/gallery curator: None     Comment: 1 partner   Other Topics Concern  . Not on file   Social History Narrative    Review of Systems: no vaginal bleeding or cramping/contractions, no LOF, no nausea/vomiting. All other systems reviewed and are negative.   PE:  124/73, 126#, 91  GEN: well-appearing female ABD: gravid, NT  Ultrasound: Single IUP at 12w 6d Normal NT (1.9 mm).  Nasal bone visualized. First trimester aneuploidy screen performed as noted above.  Please do not draw triple/quad screen, though patient should be offered MSAFP for neural tube defect screening.   Labs: CBC    Component Value Date/Time   WBC 6.3 07/29/2014 0915   RBC 4.43 07/29/2014 0915   HGB 11.4*  07/29/2014 0915   HCT 34.4* 07/29/2014 0915   PLT 185 07/29/2014 0915   MCV 77.7* 07/29/2014 0915   MCH 25.7* 07/29/2014 0915   MCHC 33.1 07/29/2014 0915   RDW 14.5 07/29/2014 0915   LYMPHSABS 2.2 07/22/2014 1329   MONOABS 0.5 07/22/2014 1329   EOSABS 0.1 07/22/2014 1329   BASOSABS 0.0 07/22/2014 1329   CMP     Component Value Date/Time   NA 134* 07/29/2014 0915   K 3.6 07/29/2014 0915   CL 103 07/29/2014 0915   CO2 24 07/29/2014 0915   GLUCOSE 98 07/29/2014 0915   BUN 7 07/29/2014 0915   CREATININE 0.45* 07/29/2014 0915   CREATININE 0.51 07/23/2014 1651   CALCIUM 9.1 07/29/2014 0915   PROT 7.0 07/29/2014 0915   ALBUMIN 3.5 07/29/2014 0915   AST 25 07/29/2014 0915   ALT 31 07/29/2014 0915   ALKPHOS 42 07/29/2014 0915   BILITOT 0.4 07/29/2014 0915   GFRNONAA >90 07/29/2014 0915   GFRAA >90 07/29/2014 0915    A/P:  1) Single IUP at 12w 6d  2) Hx of severe IUGR / preeclampsia with severe features required delivery at 25 weeks - given early onset of preeclampsia, the patient is at significant risk for recurrence (~20-30% range).  Concur with addition of baby ASA.  She will require close follow up - serial growth scans and screening for blood pressures and symptoms.  3) Prior classical Cesarean section   Recommendations: 1) Baseline preeclampsia labs (done) - 24 hr urine protein is currently pending 2) Will screen for antiphospholipid antibodies due to early onset preeclampsia - lupus anticoagulant, anticardiolipin antibodies and beta2 glycoprotein antibodies 3) Recommend follow up ultrasound with MFM in 6 weeks for anatomy.  She will need serial growth studies every 4 weeks thereafter. 4) Antenatal testing beginning no later than [redacted] weeks gestation.  May start earlier is the clinical scenario dictates (i.e. If fetal growth restriction or lagging growth is identified earlier) 5) If the patient is otherwise stable, would recommend delivery at [redacted] weeks gestation via C-section due  to prior classical uterine incision.   Thank you for the opportunity to be a part of the care of Stephanie Mack. Please contact our office if we can be of further assistance.   I spent approximately 30 minutes with this patient with over 50% of time spent in face-to-face counseling.  Benjaman Lobe, MD Maternal Fetal Medicine

## 2014-07-30 LAB — LUPUS ANTICOAGULANT PANEL
DRVVT: 38.9 s (ref 0.0–55.1)
PTT LA: 32.5 s (ref 0.0–50.0)

## 2014-07-31 LAB — CARDIOLIPIN ANTIBODIES, IGG, IGM, IGA: ANTICARDIOLIPIN IGM: 10 [MPL'U]/mL (ref 0–12)

## 2014-07-31 LAB — BETA-2-GLYCOPROTEIN I ABS, IGG/M/A
Beta-2 Glyco I IgG: <9 GPI IgG units (ref 0–20)
Beta-2-Glycoprotein I IgA: <9 GPI IgA units (ref 0–25)
Beta-2-Glycoprotein I IgM: <9 GPI IgM units (ref 0–32)

## 2014-08-06 ENCOUNTER — Telehealth (HOSPITAL_COMMUNITY): Payer: Self-pay | Admitting: *Deleted

## 2014-08-06 NOTE — Telephone Encounter (Signed)
[place] with her lab results from last week. Reviewed them with Dr. Burnett Harry.  Lupus, Beta-2, cardiolipin antibodies and baseline pre-eclampsia labs were all WNL.  Pt had no further questions.

## 2014-08-12 ENCOUNTER — Encounter (HOSPITAL_COMMUNITY): Payer: Self-pay | Admitting: *Deleted

## 2014-08-12 ENCOUNTER — Telehealth: Payer: Self-pay | Admitting: *Deleted

## 2014-08-12 ENCOUNTER — Inpatient Hospital Stay (HOSPITAL_COMMUNITY)
Admission: AD | Admit: 2014-08-12 | Discharge: 2014-08-12 | Disposition: A | Discharge status: Home / Self Care | Payer: Medicaid Other | Source: Ambulatory Visit | Attending: Obstetrics | Admitting: Obstetrics

## 2014-08-12 DIAGNOSIS — O99612 Diseases of the digestive system complicating pregnancy, second trimester: Secondary | ICD-10-CM | POA: Diagnosis not present

## 2014-08-12 DIAGNOSIS — K5900 Constipation, unspecified: Secondary | ICD-10-CM

## 2014-08-12 DIAGNOSIS — Z3A16 16 weeks gestation of pregnancy: Secondary | ICD-10-CM | POA: Diagnosis not present

## 2014-08-12 DIAGNOSIS — Z3A14 14 weeks gestation of pregnancy: Secondary | ICD-10-CM

## 2014-08-12 DIAGNOSIS — M545 Low back pain: Secondary | ICD-10-CM | POA: Diagnosis present

## 2014-08-12 DIAGNOSIS — O26899 Other specified pregnancy related conditions, unspecified trimester: Secondary | ICD-10-CM

## 2014-08-12 DIAGNOSIS — R109 Unspecified abdominal pain: Secondary | ICD-10-CM | POA: Diagnosis not present

## 2014-08-12 DIAGNOSIS — O9989 Other specified diseases and conditions complicating pregnancy, childbirth and the puerperium: Secondary | ICD-10-CM | POA: Diagnosis not present

## 2014-08-12 DIAGNOSIS — N831 Corpus luteum cyst of ovary, unspecified side: Secondary | ICD-10-CM

## 2014-08-12 LAB — URINALYSIS, ROUTINE W REFLEX MICROSCOPIC
Bilirubin Urine: NEGATIVE
Glucose, UA: NEGATIVE mg/dL
Hgb urine dipstick: NEGATIVE
Ketones, ur: NEGATIVE mg/dL
Leukocytes, UA: NEGATIVE
Nitrite: NEGATIVE
Protein, ur: NEGATIVE mg/dL
SPECIFIC GRAVITY, URINE: 1.025 (ref 1.005–1.030)
Urobilinogen, UA: 0.2 mg/dL (ref 0.0–1.0)
pH: 6.5 (ref 5.0–8.0)

## 2014-08-12 LAB — CBC
HCT: 33.1 % — ABNORMAL LOW (ref 36.0–46.0)
Hemoglobin: 11 g/dL — ABNORMAL LOW (ref 12.0–15.0)
MCH: 26 pg (ref 26.0–34.0)
MCHC: 33.2 g/dL (ref 30.0–36.0)
MCV: 78.3 fL (ref 78.0–100.0)
Platelets: 193 10*3/uL (ref 150–400)
RBC: 4.23 MIL/uL (ref 3.87–5.11)
RDW: 14.4 % (ref 11.5–15.5)
WBC: 7.7 10*3/uL (ref 4.0–10.5)

## 2014-08-12 LAB — WET PREP, GENITAL
Clue Cells Wet Prep HPF POC: NONE SEEN
TRICH WET PREP: NONE SEEN
Yeast Wet Prep HPF POC: NONE SEEN

## 2014-08-12 MED ORDER — POLYETHYLENE GLYCOL 3350 17 G PO PACK: 17.0000 g | PACK | Freq: Every day | ORAL | Status: DC | PRN

## 2014-08-12 NOTE — Discharge Instructions (Signed)
Constipation °Constipation is when a person has fewer than three bowel movements a week, has difficulty having a bowel movement, or has stools that are dry, hard, or larger than normal. As people grow older, constipation is more common. If you try to fix constipation with medicines that make you have a bowel movement (laxatives), the problem may get worse. Long-term laxative use may cause the muscles of the colon to become weak. A low-fiber diet, not taking in enough fluids, and taking certain medicines may make constipation worse.  °CAUSES  °· Certain medicines, such as antidepressants, pain medicine, iron supplements, antacids, and water pills.   °· Certain diseases, such as diabetes, irritable bowel syndrome (IBS), thyroid disease, or depression.   °· Not drinking enough water.   °· Not eating enough fiber-rich foods.   °· Stress or travel.   °· Lack of physical activity or exercise.   °· Ignoring the urge to have a bowel movement.   °· Using laxatives too much.   °SIGNS AND SYMPTOMS  °· Having fewer than three bowel movements a week.   °· Straining to have a bowel movement.   °· Having stools that are hard, dry, or larger than normal.   °· Feeling full or bloated.   °· Pain in the lower abdomen.   °· Not feeling relief after having a bowel movement.   °DIAGNOSIS  °Your health care provider will take a medical history and perform a physical exam. Further testing may be done for severe constipation. Some tests may include: °· A barium enema X-ray to examine your rectum, colon, and, sometimes, your small intestine.   °· A sigmoidoscopy to examine your lower colon.   °· A colonoscopy to examine your entire colon. °TREATMENT  °Treatment will depend on the severity of your constipation and what is causing it. Some dietary treatments include drinking more fluids and eating more fiber-rich foods. Lifestyle treatments may include regular exercise. If these diet and lifestyle recommendations do not help, your health care  provider may recommend taking over-the-counter laxative medicines to help you have bowel movements. Prescription medicines may be prescribed if over-the-counter medicines do not work.  °HOME CARE INSTRUCTIONS  °· Eat foods that have a lot of fiber, such as fruits, vegetables, whole grains, and beans. °· Limit foods high in fat and processed sugars, such as french fries, hamburgers, cookies, candies, and soda.   °· A fiber supplement may be added to your diet if you cannot get enough fiber from foods.   °· Drink enough fluids to keep your urine clear or pale yellow.   °· Exercise regularly or as directed by your health care provider.   °· Go to the restroom when you have the urge to go. Do not hold it.   °· Only take over-the-counter or prescription medicines as directed by your health care provider. Do not take other medicines for constipation without talking to your health care provider first.   °SEEK IMMEDIATE MEDICAL CARE IF:  °· You have bright red blood in your stool.   °· Your constipation lasts for more than 4 days or gets worse.   °· You have abdominal or rectal pain.   °· You have thin, pencil-like stools.   °· You have unexplained weight loss. °MAKE SURE YOU:  °· Understand these instructions. °· Will watch your condition. °· Will get help right away if you are not doing well or get worse. °Document Released: 12/18/2003 Document Revised: 03/26/2013 Document Reviewed: 12/31/2012 °[employer]® Patient Information ©2015 [employer]. This information is not intended to replace advice given to you by your health care provider. Make sure you discuss any questions   you have with your health care provider.   Abdominal Pain During Pregnancy Abdominal pain is common in pregnancy. Most of the time, it does not cause harm. There are many causes of abdominal pain. Some causes are more serious than others. Some of the causes of abdominal pain in pregnancy are easily diagnosed. Occasionally, the diagnosis takes time to  understand. Other times, the cause is not determined. Abdominal pain can be a sign that something is very wrong with the pregnancy, or the pain may have nothing to do with the pregnancy at all. For this reason, always tell your health care provider if you have any abdominal discomfort. HOME CARE INSTRUCTIONS  Monitor your abdominal pain for any changes. The following actions may help to alleviate any discomfort you are experiencing:  Do not have sexual intercourse or put anything in your vagina until your symptoms go away completely.  Get plenty of rest until your pain improves.  Drink clear fluids if you feel nauseous. Avoid solid food as long as you are uncomfortable or nauseous.  Only take over-the-counter or prescription medicine as directed by your health care provider.  Keep all follow-up appointments with your health care provider. SEEK IMMEDIATE MEDICAL CARE IF:  You are bleeding, leaking fluid, or passing tissue from the vagina.  You have increasing pain or cramping.  You have persistent vomiting.  You have painful or bloody urination.  You have a fever.  You notice a decrease in your baby's movements.  You have extreme weakness or feel faint.  You have shortness of breath, with or without abdominal pain.  You develop a severe headache with abdominal pain.  You have abnormal vaginal discharge with abdominal pain.  You have persistent diarrhea.  You have abdominal pain that continues even after rest, or gets worse. MAKE SURE YOU:   Understand these instructions.  Will watch your condition.  Will get help right away if you are not doing well or get worse. Document Released: 03/21/2005 Document Revised: 01/09/2013 Document Reviewed: 10/18/2012 [facility] Patient Information 2015 [place], [place]. This information is not intended to replace advice given to you by your health care provider. Make sure you discuss any questions you have with your health care  provider.   Round Ligament Pain During Pregnancy   Round ligament pain is a sharp pain or jabbing feeling often felt in the lower belly or groin area on one or both sides. It is one of the most common complaints during pregnancy and is considered a normal part of pregnancy. It is most often felt during the second trimester.   Here is what you need to know about round ligament pain, including some tips to help you feel better.   Causes of Round Ligament Pain   Several thick ligaments surround and support your womb (uterus) as it grows during pregnancy. One of them is called the round ligament.   The round ligament connects the front part of the womb to your groin, the area where your legs attach to your pelvis. The round ligament normally tightens and relaxes slowly.   As your baby and womb grow, the round ligament stretches. That makes it more likely to become strained.   Sudden movements can cause the ligament to tighten quickly, like a rubber band snapping. This causes a sudden and quick jabbing feeling.   Symptoms of Round Ligament Pain   Round ligament pain can be concerning and uncomfortable. But it is considered normal as your body changes during pregnancy.  The symptoms of round ligament pain include a sharp, sudden spasm in the belly. It usually affects the right side, but it may happen on both sides. The pain only lasts a few seconds.   Exercise may cause the pain, as will rapid movements such as:  sneezing  coughing  laughing  rolling over in bed  standing up too quickly   Treatment of Round Ligament Pain   Here are some tips that may help reduce your discomfort:   Pain relief. Take over-the-counter acetaminophen for pain, if necessary. Ask your doctor if this is OK.   Exercise. Get plenty of exercise to keep your stomach (core) muscles strong. Doing stretching exercises or prenatal yoga can be helpful. Ask your doctor which exercises are safe for you and your baby.    A helpful exercise involves putting your hands and knees on the floor, lowering your head, and pushing your backside into the air.   Avoid sudden movements. Change positions slowly (such as standing up or sitting down) to avoid sudden movements that may cause stretching and pain.   Flex your hips. Bend and flex your hips before you cough, sneeze, or laugh to avoid pulling on the ligaments.   Apply warmth. A heating pad or warm bath may be helpful. Ask your doctor if this is OK. Extreme heat can be dangerous to the baby.   You should try to modify your daily activity level and avoid positions that may worsen the condition.   When to Call the Doctor/Midwife   Always tell your doctor or midwife about any type of pain you have during pregnancy. Round ligament pain is quick and doesn't last long.   Call your health care provider immediately if you have:  severe pain  fever  chills  pain on urination  difficulty walking   Belly pain during pregnancy can be due to many different causes. It is important for your doctor to rule out more serious conditions, including pregnancy complications such as placenta abruption or non-pregnancy illnesses such as:  inguinal hernia  appendicitis  stomach, liver, and kidney problems  Preterm labor pains may sometimes be mistaken for round ligament pain.

## 2014-08-12 NOTE — Telephone Encounter (Signed)
Patient is calling with side and back pain. 2:45 Patient states she has R side pain and bilateral lower back pain. Patient states she has been having this pain since yesterday evening. Discussed reasons for pain during pregnancy- no urinary symptoms present per patient. Advised use comfort measures- warm bath,shower, heating pad, tylenol, rest... Patient to call in am if her symptoms are no better for appointment.

## 2014-08-12 NOTE — MAU Provider Note (Signed)
Chief Complaint: Back Pain and Abdominal Pain   First Provider Initiated Contact with Patient 08/12/14 1641     SUBJECTIVE HPI: Stephanie Mack is a 24 y.o. G3P0110 at [redacted]w[redacted]d by LMP who presents to [facility] Admissions reporting moderate low back pain radiating down to bilat groin and possible constipation since yesterday.  Feels as if she needs to have a BM, but is worried about straining. Describes pain as constant, throbbing. There are no aggravating or alleviating factors. She hasn't tried anything for the pain. Denies fever, chills, vaginal bleeding or vaginal discharge. Getting prenatal care at [facility]. Normal first trimester screen ultrasound 07/29/2014 showing single, live intrauterine pregnancy. Right corpus luteum cyst.  Past Medical History  Diagnosis Date  . Depression     depression  . Bipolar disorder   . ADHD (attention deficit hyperactivity disorder)   . Anxiety   . Anemia   . ADHD (attention deficit hyperactivity disorder)   . Bipolar 1 disorder   . Chlamydia 05-31-10  . Pseudocyesis 2013    Seen in MAU for percieved FM, abd distension. Normal exam.   . Gonorrhea contact, treated   . Pregnancy induced hypertension    OB History  Gravida Para Term Preterm AB SAB TAB Ectopic Multiple Living  3 1 0 1 1 1 0 0 0 0     # Outcome Date GA Lbr Len/2nd Weight Sex Delivery Anes PTL Lv  3 Current           2 Preterm 09/01/12 [redacted]w[redacted]d   F CS-LTranv Spinal  N  1 SAB 09/16/09 [redacted]w[redacted]d            Comments: Twin gestational sacs.      Past Surgical History  Procedure Laterality Date  . Nasal septum surgery    . Cesarean section N/A 09/01/2012    Procedure:  Primary cesarean section with delivery of baby girl at 4.  Apgars 1/1.  ;  Surgeon: Frederico Hamman, MD;  Location: [facility];  Service: Obstetrics;  Laterality: N/A;   History   Social History  . Marital Status: Single    Spouse Name: N/A  . Number of Children: N/A  . Years of Education: N/A   Occupational History  . Not  on file.   Social History Main Topics  . Smoking status: Never Smoker   . Smokeless tobacco: Never Used  . Alcohol Use: No  . Drug Use: No  . Sexual Activity:    Partners: Male    Birth Control/ Protection: None     Comment: 1 partner   Other Topics Concern  . Not on file   Social History Narrative   No current facility-administered medications on file prior to encounter.   Current Outpatient Prescriptions on File Prior to Encounter  Medication Sig Dispense Refill  . aspirin EC 81 MG tablet Take 1 tablet (81 mg total) by mouth daily. 90 tablet 5  . docusate sodium (COLACE) 100 MG capsule Take 1 capsule (100 mg total) by mouth 2 (two) times daily. 60 capsule 11  . Prenatal Vit-Fe Fumarate-FA (PRENATAL MULTIVITAMIN) TABS tablet Take 1 tablet by mouth daily at 12 noon.    . cyclobenzaprine (FLEXERIL) 10 MG tablet Take 1 tablet (10 mg total) by mouth 3 (three) times daily as needed for muscle spasms. (Patient not taking: Reported on 07/22/2014) 30 tablet 0  . fluconazole (DIFLUCAN) 150 MG tablet Take 1 tablet (150 mg total) by mouth once. (Patient not taking: Reported on 08/12/2014) 1 tablet 2  .  promethazine (PHENERGAN) 12.5 MG tablet Take 1 tablet (12.5 mg total) by mouth every 6 (six) hours as needed for nausea or vomiting. (Patient not taking: Reported on 07/22/2014) 30 tablet 0   No Known Allergies  Review of Systems  Constitutional: Negative for fever and chills.  Gastrointestinal: Positive for abdominal pain and constipation. Negative for nausea, vomiting, diarrhea and blood in stool.  Genitourinary: Negative for dysuria, urgency, frequency, hematuria and flank pain.  Musculoskeletal: Positive for back pain. Negative for myalgias.    OBJECTIVE Blood pressure 125/72, pulse 112, temperature 98.1 F (36.7 C), temperature source Oral, resp. rate 18, height 5' 0.75" (1.543 m), weight 128 lb (58.06 kg), last menstrual period 04/30/2014. GENERAL: Well-developed, well-nourished female  in no acute distress.  HEART: normal rate RESP: normal effort GI: Abdomen soft, non-tender. Positive bowel sounds 4. Gravid, size equals dates.  MS: Mild tenderness of paraspinal muscles in low back. Normal range of motion. No CVA tenderness.  NEURO: Alert and oriented SPECULUM EXAM: NEFG, physiologic discharge, no blood noted, cervix clean BIMANUAL: cervix long and closed; uterus 14-week  size, no adnexal tenderness or masses  LAB RESULTS Results for orders placed or performed during the hospital encounter of 08/12/14 (from the past 24 hour(s))  Urinalysis, Routine w reflex microscopic     Status: Abnormal   Collection Time: 08/12/14  4:09 PM  Result Value Ref Range   Color, Urine YELLOW YELLOW   APPearance CLOUDY (A) CLEAR   Specific Gravity, Urine 1.025 1.005 - 1.030   pH 6.5 5.0 - 8.0   Glucose, UA NEGATIVE NEGATIVE mg/dL   Hgb urine dipstick NEGATIVE NEGATIVE   Bilirubin Urine NEGATIVE NEGATIVE   Ketones, ur NEGATIVE NEGATIVE mg/dL   Protein, ur NEGATIVE NEGATIVE mg/dL   Urobilinogen, UA 0.2 0.0 - 1.0 mg/dL   Nitrite NEGATIVE NEGATIVE   Leukocytes, UA NEGATIVE NEGATIVE    IMAGING N/A  MAU COURSE CBC, UA, wet prep, GC/chlamydia cultures. Previous ultrasounds reviewed. Repeat ultrasound not indicated by today's exam.  ASSESSMENT 1. Constipation during pregnancy in second trimester   2. Corpus luteum cyst   3. Abdominal pain affecting pregnancy, antepartum    PLAN Discharge home in stable condition. Comfort measures. Increase fluids and fiber. Follow-up Information    Follow up with [facility] On 08/19/2014.   Specialty:  Obstetrics and Gynecology   Why:  Start prenatal care   Contact information:   [address] [place] [phone]      Follow up with [facility].   Why:  As needed in emergencies   Contact information:   [address] [id number] [place] [phone]        Medication List    ASK your doctor about these medications        aspirin EC 81 MG tablet  Take 1 tablet (81 mg total) by mouth daily.     cyclobenzaprine 10 MG tablet  Commonly known as:  FLEXERIL  Take 1 tablet (10 mg total) by mouth 3 (three) times daily as needed for muscle spasms.     docusate sodium 100 MG capsule  Commonly known as:  COLACE  Take 1 capsule (100 mg total) by mouth 2 (two) times daily.     fluconazole 150 MG tablet  Commonly known as:  DIFLUCAN  Take 1 tablet (150 mg total) by mouth once.     prenatal multivitamin Tabs tablet  Take  1 tablet by mouth daily at 12 noon.     promethazine 12.5 MG tablet  Commonly known as:  PHENERGAN  Take 1 tablet (12.5 mg total) by mouth every 6 (six) hours as needed for nausea or vomiting.         [place], [place] 08/12/2014  4:42 PM

## 2014-08-12 NOTE — MAU Note (Signed)
Low back pain started yesterday, has been hurting on her sides.   Has been trying not to strain, ? Constipation.

## 2014-08-13 ENCOUNTER — Telehealth (HOSPITAL_COMMUNITY): Payer: Self-pay | Admitting: *Deleted

## 2014-08-13 DIAGNOSIS — O98811 Other maternal infectious and parasitic diseases complicating pregnancy, first trimester: Principal | ICD-10-CM

## 2014-08-13 DIAGNOSIS — A749 Chlamydial infection, unspecified: Secondary | ICD-10-CM

## 2014-08-13 LAB — GC/CHLAMYDIA PROBE AMP (~~LOC~~) NOT AT ARMC
CHLAMYDIA, DNA PROBE: POSITIVE — AB
Neisseria Gonorrhea: NEGATIVE

## 2014-08-13 MED ORDER — AZITHROMYCIN 500 MG PO TABS: ORAL_TABLET | ORAL | Status: DC

## 2014-08-13 NOTE — Telephone Encounter (Signed)
Telephone call to patient regarding positive chlamydia culture, patient notified.  Rx called in per protocol to patient's pharmacy.  Instructed patient to notify her partner for treatment and to abstain from sex for 7 days after both have been treated.  Report of treatment faxed to health department.

## 2014-08-15 ENCOUNTER — Other Ambulatory Visit: Payer: Self-pay | Admitting: Obstetrics

## 2014-08-15 DIAGNOSIS — O98811 Other maternal infectious and parasitic diseases complicating pregnancy, first trimester: Principal | ICD-10-CM

## 2014-08-15 DIAGNOSIS — A749 Chlamydial infection, unspecified: Secondary | ICD-10-CM

## 2014-08-15 MED ORDER — AZITHROMYCIN 500 MG PO TABS: ORAL_TABLET | ORAL | Status: DC

## 2014-08-19 ENCOUNTER — Encounter: Payer: Medicaid Other | Admitting: Certified Nurse Midwife

## 2014-08-19 ENCOUNTER — Encounter: Payer: Medicaid Other | Admitting: Obstetrics

## 2014-08-19 ENCOUNTER — Ambulatory Visit (INDEPENDENT_AMBULATORY_CARE_PROVIDER_SITE_OTHER): Payer: Medicaid Other | Admitting: Obstetrics

## 2014-08-19 VITALS — BP 120/75 | HR 96 | Wt 128.2 lb

## 2014-08-19 DIAGNOSIS — Z3402 Encounter for supervision of normal first pregnancy, second trimester: Secondary | ICD-10-CM | POA: Diagnosis not present

## 2014-08-19 DIAGNOSIS — K219 Gastro-esophageal reflux disease without esophagitis: Secondary | ICD-10-CM

## 2014-08-19 DIAGNOSIS — O09213 Supervision of pregnancy with history of pre-term labor, third trimester: Secondary | ICD-10-CM

## 2014-08-19 DIAGNOSIS — O09293 Supervision of pregnancy with other poor reproductive or obstetric history, third trimester: Secondary | ICD-10-CM

## 2014-08-19 LAB — POCT URINALYSIS DIPSTICK
Bilirubin, UA: NEGATIVE
Blood, UA: NEGATIVE
Glucose, UA: NEGATIVE
Ketones, UA: NEGATIVE
LEUKOCYTES UA: NEGATIVE
NITRITE UA: NEGATIVE
PH UA: 6
Spec Grav, UA: 1.02
Urobilinogen, UA: NEGATIVE

## 2014-08-19 MED ORDER — RANITIDINE HCL 150 MG PO TABS: 150.0000 mg | ORAL_TABLET | Freq: Two times a day (BID) | ORAL | Status: DC

## 2014-08-19 MED ORDER — VITAFOL ULTRA 29-0.6-0.4-200 MG PO CAPS: 1.0000 | ORAL_CAPSULE | Freq: Every day | ORAL | Status: DC

## 2014-08-20 ENCOUNTER — Encounter: Payer: Self-pay | Admitting: Obstetrics

## 2014-08-20 NOTE — Progress Notes (Signed)
  Subjective:    Stephanie Mack is a 24 y.o. female being seen today for her obstetrical visit. She is at [redacted]w[redacted]d gestation. Patient reports: no complaints.  Problem List Items Addressed This Visit    None    Visit Diagnoses    High risk pregnancy due to history of previous obstetrical problem, third trimester    -  Primary    Relevant Medications    Prenat-Fe Poly-Methfol-FA-DHA (VITAFOL ULTRA) 29-0.6-0.4-200 MG CAPS    Other Relevant Orders    US OB Comp + 14 Wk    AMB Referral to Maternal Fetal Medicine (MFM)    Encounter for supervision of normal first pregnancy in second trimester        Relevant Orders    POCT urinalysis dipstick (Completed)    AFP, Quad Screen    Previous preterm delivery in third trimester, antepartum        Relevant Orders    US OB Comp + 14 Wk    AMB Referral to Maternal Fetal Medicine (MFM)    GERD without esophagitis        Relevant Medications    ranitidine (ZANTAC) 150 MG tablet      Patient Active Problem List   Diagnosis Date Noted  . Encounter for (NT) nuchal translucency scan   . History of IUGR (intrauterine growth retardation) and stillbirth, currently pregnant   . Hx of preeclampsia, prior pregnancy, currently pregnant   . [redacted] weeks gestation of pregnancy   . Essential hypertension, benign 11/27/2013  . Chronic cluster headache, not intractable 11/27/2013  . Absence of menstruation 11/27/2013  . Adjustment disorder with depressed mood 08/13/2013  . Dysmenorrhea 06/17/2013  . PIH (pregnancy induced hypertension) 06/03/2013  . Unspecified symptom associated with female genital organs 05/03/2013  . Abdominal pain in female patient 12/04/2012  . Nausea and vomiting in adult patient 12/04/2012  . Acute URI 12/04/2012  . Pain 10/23/2012  . Acute PID (pelvic inflammatory disease) 10/23/2012  . Screening examination for venereal disease 10/23/2012  . Abnormal urine finding 10/23/2012  . Other symptoms involving abdomen and pelvis(789.9)  10/23/2012  . BV (bacterial vaginosis) 10/23/2012  . Severe preeclampsia 09/03/2012  . Cesarean delivery delivered 09/03/2012  . Pseudocyesis     Objective:     BP 120/75 mmHg  Pulse 96  Wt 128 lb 3.2 oz (58.151 kg)  LMP 04/30/2014 Uterine Size: Below umbilicus     Assessment:    Pregnancy @ [redacted]w[redacted]d  weeks Doing well    Plan:    Problem list reviewed and updated. Labs reviewed.  Follow up in 4 weeks. FIRST/CF mutation testing/NIPT/QUAD SCREEN/fragile X/[name] population testing/Spinal muscular atrophy discussed: requested. Role of ultrasound in pregnancy discussed; fetal survey: requested. Amniocentesis discussed: not indicated.

## 2014-08-21 LAB — AFP, QUAD SCREEN
AFP: 28.1 ng/mL
Curr Gest Age: 16.3 wks.days
Down Syndrome Scr Risk Est: 1:29700 {titer}
HCG, Total: 7.75 IU/mL
INH: 73.3 pg/mL
Interpretation-AFP: NEGATIVE
MOM FOR HCG: 0.17
MoM for AFP: 0.64
MoM for INH: 0.4
Open Spina bifida: NEGATIVE
TRI 18 SCR RISK EST: NEGATIVE
uE3 Mom: 1.09
uE3 Value: 1.03 ng/mL

## 2014-08-22 ENCOUNTER — Ambulatory Visit (HOSPITAL_COMMUNITY): Admission: RE | Admit: 2014-08-22 | Payer: Medicaid Other | Source: Ambulatory Visit

## 2014-08-23 ENCOUNTER — Inpatient Hospital Stay (HOSPITAL_COMMUNITY)
Admission: AD | Admit: 2014-08-23 | Discharge: 2014-08-23 | Disposition: A | Discharge status: Home / Self Care | Payer: Medicaid Other | Source: Ambulatory Visit | Attending: Obstetrics | Admitting: Obstetrics

## 2014-08-23 ENCOUNTER — Encounter (HOSPITAL_COMMUNITY): Payer: Self-pay | Admitting: *Deleted

## 2014-08-23 DIAGNOSIS — O26899 Other specified pregnancy related conditions, unspecified trimester: Secondary | ICD-10-CM

## 2014-08-23 DIAGNOSIS — O9989 Other specified diseases and conditions complicating pregnancy, childbirth and the puerperium: Secondary | ICD-10-CM

## 2014-08-23 DIAGNOSIS — Z7982 Long term (current) use of aspirin: Secondary | ICD-10-CM | POA: Insufficient documentation

## 2014-08-23 DIAGNOSIS — R110 Nausea: Secondary | ICD-10-CM | POA: Diagnosis not present

## 2014-08-23 DIAGNOSIS — R109 Unspecified abdominal pain: Secondary | ICD-10-CM | POA: Insufficient documentation

## 2014-08-23 DIAGNOSIS — R102 Pelvic and perineal pain: Secondary | ICD-10-CM

## 2014-08-23 DIAGNOSIS — R141 Gas pain: Secondary | ICD-10-CM

## 2014-08-23 DIAGNOSIS — Z3A16 16 weeks gestation of pregnancy: Secondary | ICD-10-CM | POA: Diagnosis not present

## 2014-08-23 LAB — URINALYSIS, ROUTINE W REFLEX MICROSCOPIC
BILIRUBIN URINE: NEGATIVE
Glucose, UA: NEGATIVE mg/dL
HGB URINE DIPSTICK: NEGATIVE
Ketones, ur: NEGATIVE mg/dL
Leukocytes, UA: NEGATIVE
Nitrite: NEGATIVE
PROTEIN: NEGATIVE mg/dL
Specific Gravity, Urine: 1.025 (ref 1.005–1.030)
Urobilinogen, UA: 0.2 mg/dL (ref 0.0–1.0)
pH: 6 (ref 5.0–8.0)

## 2014-08-23 NOTE — Discharge Instructions (Signed)

## 2014-08-23 NOTE — MAU Note (Signed)
Pt stated when she woke up this morning she was feeling nauseated and started having sharp shooting pain in pelvic area. Denies  Vag bleeding or discharge.

## 2014-08-23 NOTE — MAU Provider Note (Signed)
History     CSN: [id number]  Arrival date and time: 08/23/14 1457   First Provider Initiated Contact with Patient 08/23/14 1538      Chief Complaint  Patient presents with  . Abdominal Pain  . Nausea   HPI  Stephanie Mack is a 24 y.o. G3P0110 at [redacted]w[redacted]d. She presents with c/o low pelvic pain around 1400- she was lying on the couch. She also had nausea, no vomiting, fever or chills. No bleeding or leaking, no change in discharge, odor or itching. No UTI S&S. She has been constipated, had BM yesterday, last IC 2 wks ago- when dx with CT and treated. Last [facility] yesterday, nl visit.   OB History    Gravida Para Term Preterm AB TAB SAB Ectopic Multiple Living   3 1 0 1 1 0 1 0 0 0       Past Medical History  Diagnosis Date  . Depression     depression  . Bipolar disorder   . ADHD (attention deficit hyperactivity disorder)   . Anxiety   . Anemia   . ADHD (attention deficit hyperactivity disorder)   . Bipolar 1 disorder   . Chlamydia 05-31-10  . Pseudocyesis 2013    Seen in MAU for percieved FM, abd distension. Normal exam.   . Gonorrhea contact, treated   . Pregnancy induced hypertension     Past Surgical History  Procedure Laterality Date  . Nasal septum surgery    . Cesarean section N/A 09/01/2012    Procedure:  Primary cesarean section with delivery of baby girl at 37.  Apgars 1/1.  ;  Surgeon: Frederico Hamman, MD;  Location: [facility];  Service: Obstetrics;  Laterality: N/A;    Family History  Problem Relation Age of Onset  . Kidney disease Mother   . Hypertension Father   . Cancer Sister   . Asthma Brother   . Heart disease Neg Hx   . Diabetes Maternal Grandfather     History  Substance Use Topics  . Smoking status: Never Smoker   . Smokeless tobacco: Never Used  . Alcohol Use: No    Allergies: No Known Allergies  Prescriptions prior to admission  Medication Sig Dispense Refill Last Dose  . aspirin EC 81 MG tablet Take 1 tablet (81 mg total) by mouth  daily. 90 tablet 5 Taking  . azithromycin (ZITHROMAX) 500 MG tablet Take two tablets once. (Patient not taking: Reported on 08/19/2014) 2 tablet 0 Not Taking  . docusate sodium (COLACE) 100 MG capsule Take 1 capsule (100 mg total) by mouth 2 (two) times daily. (Patient not taking: Reported on 08/19/2014) 60 capsule 11 Not Taking  . polyethylene glycol (MIRALAX / GLYCOLAX) packet Take 17 g by mouth daily as needed. (Patient not taking: Reported on 08/19/2014) 14 each 2 Not Taking  . Prenat-Fe Poly-Methfol-FA-DHA (VITAFOL ULTRA) 29-0.6-0.4-200 MG CAPS Take 1 capsule by mouth daily before breakfast. 90 capsule 3   . Prenatal Vit-Fe Fumarate-FA (PRENATAL MULTIVITAMIN) TABS tablet Take 1 tablet by mouth daily at 12 noon.   Taking  . ranitidine (ZANTAC) 150 MG tablet Take 1 tablet (150 mg total) by mouth 2 (two) times daily. 60 tablet 11     Review of Systems  Constitutional: Negative for fever and chills.  Gastrointestinal: Positive for nausea, abdominal pain and constipation. Negative for heartburn and vomiting.  Genitourinary: Negative for dysuria, urgency and frequency.       Denies discharge, odor and itching Denies bleeding or leaking  Physical Exam   Blood pressure 128/76, pulse 106, temperature 98.5 F (36.9 C), temperature source Oral, resp. rate 18, last menstrual period 04/30/2014.  Physical Exam  Nursing note and vitals reviewed. Constitutional: She is oriented to person, place, and time. She appears well-developed and well-nourished.  GI: Soft. Bowel sounds are normal. She exhibits distension. There is tenderness (sl tenderness bil low abd).  Tympanic to palpation + FHT's  Genitourinary:  Pelvic exam- Ext gen- nl anatomy, skin intact Vagina- small amt white creamy discharge Cx- long, closed, firm Uterus- gravid, 16-18 wks size Adn-non tender  Musculoskeletal: Normal range of motion.  Neurological: She is alert and oriented to person, place, and time.  Skin: Skin is warm and  dry.  Psychiatric: She has a normal mood and affect. Her behavior is normal.    MAU Course  Procedures Results for orders placed or performed during the hospital encounter of 08/23/14 (from the past 24 hour(s))  Urinalysis, Routine w reflex microscopic     Status: None   Collection Time: 08/23/14  3:07 PM  Result Value Ref Range   Color, Urine YELLOW YELLOW   APPearance CLEAR CLEAR   Specific Gravity, Urine 1.025 1.005 - 1.030   pH 6.0 5.0 - 8.0   Glucose, UA NEGATIVE NEGATIVE mg/dL   Hgb urine dipstick NEGATIVE NEGATIVE   Bilirubin Urine NEGATIVE NEGATIVE   Ketones, ur NEGATIVE NEGATIVE mg/dL   Protein, ur NEGATIVE NEGATIVE mg/dL   Urobilinogen, UA 0.2 0.0 - 1.0 mg/dL   Nitrite NEGATIVE NEGATIVE   Leukocytes, UA NEGATIVE NEGATIVE    MDM 16+ wks EGA with low abd pain that is getting better. Abd soft, distended, tympanic- hx constipation.  Assessment and Plan   16 wks abd pain- constipation, gas, or round ligament pain OTC  tx reviewed Next prenatal appt in ~ 4 wks Call [facility] for other changes or concerns      Dondi Aime, Juliann Pulse M. 08/23/2014, 3:44 PM

## 2014-08-25 ENCOUNTER — Telehealth: Payer: Self-pay | Admitting: *Deleted

## 2014-08-25 ENCOUNTER — Encounter (HOSPITAL_COMMUNITY): Payer: Self-pay | Admitting: *Deleted

## 2014-08-25 ENCOUNTER — Inpatient Hospital Stay (HOSPITAL_COMMUNITY)
Admission: AD | Admit: 2014-08-25 | Discharge: 2014-08-25 | Disposition: A | Discharge status: Home / Self Care | Payer: Medicaid Other | Source: Ambulatory Visit | Attending: Obstetrics | Admitting: Obstetrics

## 2014-08-25 DIAGNOSIS — K219 Gastro-esophageal reflux disease without esophagitis: Secondary | ICD-10-CM | POA: Insufficient documentation

## 2014-08-25 DIAGNOSIS — O9989 Other specified diseases and conditions complicating pregnancy, childbirth and the puerperium: Secondary | ICD-10-CM

## 2014-08-25 DIAGNOSIS — Z3A16 16 weeks gestation of pregnancy: Secondary | ICD-10-CM | POA: Insufficient documentation

## 2014-08-25 DIAGNOSIS — Z3A17 17 weeks gestation of pregnancy: Secondary | ICD-10-CM

## 2014-08-25 DIAGNOSIS — R079 Chest pain, unspecified: Secondary | ICD-10-CM | POA: Diagnosis present

## 2014-08-25 DIAGNOSIS — O26892 Other specified pregnancy related conditions, second trimester: Secondary | ICD-10-CM | POA: Diagnosis not present

## 2014-08-25 DIAGNOSIS — O99891 Other specified diseases and conditions complicating pregnancy: Secondary | ICD-10-CM

## 2014-08-25 DIAGNOSIS — O99612 Diseases of the digestive system complicating pregnancy, second trimester: Secondary | ICD-10-CM | POA: Insufficient documentation

## 2014-08-25 DIAGNOSIS — M549 Dorsalgia, unspecified: Secondary | ICD-10-CM | POA: Diagnosis not present

## 2014-08-25 LAB — URINALYSIS, ROUTINE W REFLEX MICROSCOPIC
Bilirubin Urine: NEGATIVE
Glucose, UA: NEGATIVE mg/dL
Hgb urine dipstick: NEGATIVE
Ketones, ur: NEGATIVE mg/dL
Leukocytes, UA: NEGATIVE
NITRITE: NEGATIVE
PH: 6.5 (ref 5.0–8.0)
Protein, ur: NEGATIVE mg/dL
Specific Gravity, Urine: 1.025 (ref 1.005–1.030)
Urobilinogen, UA: 0.2 mg/dL (ref 0.0–1.0)

## 2014-08-25 MED ORDER — RANITIDINE HCL 150 MG PO TABS: 150.0000 mg | ORAL_TABLET | Freq: Two times a day (BID) | ORAL | Status: DC

## 2014-08-25 MED ORDER — GI COCKTAIL ~~LOC~~
30.0000 mL | Freq: Once | ORAL | Status: AC
  Administered 2014-08-25: 30 mL via ORAL
  Filled 2014-08-25: qty 30

## 2014-08-25 NOTE — Telephone Encounter (Signed)
Patient contacted the office stating she doesn't feel to well and wanted to know what she should do. Patient states she is having some pain in her chest and shortness of breath. NO history of asthma but dies have seasonal allergies that she is not currently taking any medication for. Patient states she is also having sharp pain in her sides. Patient states she is resting. Patient states she has not tried any tylenol. When speaking with the patient she states she is at [facility] right now. Patient advised to stay and be evaluated. Patient verbalized understanding.

## 2014-08-25 NOTE — MAU Note (Signed)
Pt. Urine in lab 

## 2014-08-25 NOTE — MAU Provider Note (Signed)
History     CSN: [id number]  Arrival date and time: 08/25/14 1439   First Provider Initiated Contact with Patient 08/25/14 1646      Chief Complaint  Patient presents with  . Chest Pain  . Flank Pain   HPI   [name] is a 24 y.o. female who presents with bilateral flank pain and chest pain.  The chest pain started 3 hours ago; the pain is located in the center of her breast and when she pushes on it the pain is worsened. She has never had this pain before. Lying flat makes it worse. The pain is constant   For breakfast she had waffles, eggs, and bacon. She also had some orange juice.   The flank pain started the same time as her chest started hurting . The pain is located on both sides and comes and goes. When it comes the pain is sharp. She has not tried anything over the counter. She currently rates her flank pain 7/10.   Patient says she is very nervous about this pregnancy due to her history of losses.   OB History    Gravida Para Term Preterm AB TAB SAB Ectopic Multiple Living   3 1 0 1 1 0 1 0 0 0       Past Medical History  Diagnosis Date  . Depression     depression  . Bipolar disorder   . ADHD (attention deficit hyperactivity disorder)   . Anxiety   . Anemia   . ADHD (attention deficit hyperactivity disorder)   . Bipolar 1 disorder   . Chlamydia 05-31-10  . Pseudocyesis 2013    Seen in MAU for percieved FM, abd distension. Normal exam.   . Gonorrhea contact, treated   . Pregnancy induced hypertension     Past Surgical History  Procedure Laterality Date  . Nasal septum surgery    . Cesarean section N/A 09/01/2012    Procedure:  Primary cesarean section with delivery of baby girl at 93.  Apgars 1/1.  ;  Surgeon: Frederico Hamman, MD;  Location: [facility];  Service: Obstetrics;  Laterality: N/A;    Family History  Problem Relation Age of Onset  . Kidney disease Mother   . Hypertension Father   . Cancer Sister   . Asthma Brother   . Heart  disease Neg Hx   . Diabetes Maternal Grandfather     History  Substance Use Topics  . Smoking status: Never Smoker   . Smokeless tobacco: Never Used  . Alcohol Use: No    Allergies: No Known Allergies  Prescriptions prior to admission  Medication Sig Dispense Refill Last Dose  . Prenat-Fe Poly-Methfol-FA-DHA (VITAFOL ULTRA) 29-0.6-0.4-200 MG CAPS Take 1 capsule by mouth daily before breakfast. 90 capsule 3 08/25/2014 at Unknown time  . ranitidine (ZANTAC) 150 MG tablet Take 1 tablet (150 mg total) by mouth 2 (two) times daily. 60 tablet 11 08/25/2014 at Unknown time  . docusate sodium (COLACE) 100 MG capsule Take 1 capsule (100 mg total) by mouth 2 (two) times daily. (Patient not taking: Reported on 08/19/2014) 60 capsule 11 Not Taking  . polyethylene glycol (MIRALAX / GLYCOLAX) packet Take 17 g by mouth daily as needed. (Patient not taking: Reported on 08/19/2014) 14 each 2 Not Taking at Unknown time   Results for orders placed or performed during the hospital encounter of 08/25/14 (from the past 48 hour(s))  Urinalysis, Routine w reflex microscopic     Status: None  Collection Time: 08/25/14  3:40 PM  Result Value Ref Range   Color, Urine YELLOW YELLOW   APPearance CLEAR CLEAR   Specific Gravity, Urine 1.025 1.005 - 1.030   pH 6.5 5.0 - 8.0   Glucose, UA NEGATIVE NEGATIVE mg/dL   Hgb urine dipstick NEGATIVE NEGATIVE   Bilirubin Urine NEGATIVE NEGATIVE   Ketones, ur NEGATIVE NEGATIVE mg/dL   Protein, ur NEGATIVE NEGATIVE mg/dL   Urobilinogen, UA 0.2 0.0 - 1.0 mg/dL   Nitrite NEGATIVE NEGATIVE   Leukocytes, UA NEGATIVE NEGATIVE    Comment: MICROSCOPIC NOT DONE ON URINES WITH NEGATIVE PROTEIN, BLOOD, LEUKOCYTES, NITRITE, OR GLUCOSE <1000 mg/dL.    Review of Systems  Constitutional: Positive for chills. Negative for fever.  Eyes: Negative for blurred vision.  Respiratory: Negative for shortness of breath.   Cardiovascular: Positive for chest pain.  Gastrointestinal: Positive  for heartburn. Negative for diarrhea and constipation.  Genitourinary: Positive for flank pain. Negative for dysuria and urgency.  Musculoskeletal: Positive for back pain.   Physical Exam   Blood pressure 126/72, pulse 104, temperature 98.2 F (36.8 C), temperature source Oral, resp. rate 18, last menstrual period 04/30/2014, SpO2 100 %.  Physical Exam  Constitutional: She is oriented to person, place, and time. She appears well-developed and well-nourished. No distress.  HENT:  Head: Normocephalic.  Eyes: Pupils are equal, round, and reactive to light.  Neck: Neck supple.  Cardiovascular: Normal rate and normal heart sounds.   Respiratory: Effort normal and breath sounds normal. No respiratory distress.  GI: There is no CVA tenderness.  Musculoskeletal: Normal range of motion.  Neurological: She is alert and oriented to person, place, and time.  Skin: Skin is warm. She is not diaphoretic.  Psychiatric: Her behavior is normal.    MAU Course  Procedures  None  MDM Gi cocktail  + fetal heart tones  Patient states she had significant relief from GI cocktail   Assessment and Plan   A:  1. Back pain affecting pregnancy in second trimester   2. Gastroesophageal reflux disease, esophagitis presence not specified   3. GERD without esophagitis     P:  Discharge home in stable condition RX: Zantac  Back exercises encouraged Increase water intake Follow up with Dr. Jodi Mourning as scheduled Return to MAU if symptoms worsen   Lezlie Lye, NP 08/25/2014 6:29 PM

## 2014-08-25 NOTE — MAU Note (Signed)
An hour ago pt reports chest pain and it is constant and hurts when she applies pressure to it.  She also reports pain on both of her sides and flank pain.  Denies VB/LOF.

## 2014-08-26 ENCOUNTER — Telehealth: Payer: Self-pay | Admitting: *Deleted

## 2014-08-27 ENCOUNTER — Other Ambulatory Visit: Payer: Self-pay | Admitting: *Deleted

## 2014-08-27 DIAGNOSIS — Z3492 Encounter for supervision of normal pregnancy, unspecified, second trimester: Secondary | ICD-10-CM

## 2014-08-27 MED ORDER — CONCEPT DHA 53.5-38-1 MG PO CAPS: 1.0000 | ORAL_CAPSULE | Freq: Every day | ORAL | Status: DC

## 2014-08-27 NOTE — Progress Notes (Signed)
Patient was originally given prescription for Vitafol Ultra. Pharmacy states they are having trouble getting the prenatal vitamin in. Changes patient;s prescription to Concept DHA. Patient notified of prescription change.

## 2014-08-28 NOTE — Telephone Encounter (Signed)
error 

## 2014-09-02 ENCOUNTER — Inpatient Hospital Stay (HOSPITAL_COMMUNITY)
Admission: AD | Admit: 2014-09-02 | Discharge: 2014-09-03 | Disposition: A | Discharge status: Home / Self Care | Payer: Medicaid Other | Source: Ambulatory Visit | Attending: Obstetrics | Admitting: Obstetrics

## 2014-09-02 DIAGNOSIS — R12 Heartburn: Secondary | ICD-10-CM | POA: Insufficient documentation

## 2014-09-02 DIAGNOSIS — R1010 Upper abdominal pain, unspecified: Secondary | ICD-10-CM | POA: Insufficient documentation

## 2014-09-02 DIAGNOSIS — O9989 Other specified diseases and conditions complicating pregnancy, childbirth and the puerperium: Secondary | ICD-10-CM | POA: Insufficient documentation

## 2014-09-02 DIAGNOSIS — Z3A18 18 weeks gestation of pregnancy: Secondary | ICD-10-CM | POA: Insufficient documentation

## 2014-09-02 DIAGNOSIS — K219 Gastro-esophageal reflux disease without esophagitis: Secondary | ICD-10-CM

## 2014-09-03 ENCOUNTER — Encounter (HOSPITAL_COMMUNITY): Payer: Self-pay | Admitting: *Deleted

## 2014-09-03 DIAGNOSIS — O9989 Other specified diseases and conditions complicating pregnancy, childbirth and the puerperium: Secondary | ICD-10-CM | POA: Diagnosis not present

## 2014-09-03 DIAGNOSIS — Z3A19 19 weeks gestation of pregnancy: Secondary | ICD-10-CM | POA: Diagnosis not present

## 2014-09-03 DIAGNOSIS — K219 Gastro-esophageal reflux disease without esophagitis: Secondary | ICD-10-CM | POA: Diagnosis not present

## 2014-09-03 DIAGNOSIS — O99612 Diseases of the digestive system complicating pregnancy, second trimester: Secondary | ICD-10-CM

## 2014-09-03 DIAGNOSIS — Z3A18 18 weeks gestation of pregnancy: Secondary | ICD-10-CM | POA: Diagnosis not present

## 2014-09-03 DIAGNOSIS — R12 Heartburn: Secondary | ICD-10-CM | POA: Diagnosis not present

## 2014-09-03 DIAGNOSIS — R1010 Upper abdominal pain, unspecified: Secondary | ICD-10-CM | POA: Diagnosis present

## 2014-09-03 LAB — URINALYSIS, ROUTINE W REFLEX MICROSCOPIC
Bilirubin Urine: NEGATIVE
Glucose, UA: NEGATIVE mg/dL
HGB URINE DIPSTICK: NEGATIVE
KETONES UR: NEGATIVE mg/dL
LEUKOCYTES UA: NEGATIVE
NITRITE: NEGATIVE
Protein, ur: NEGATIVE mg/dL
SPECIFIC GRAVITY, URINE: 1.025 (ref 1.005–1.030)
UROBILINOGEN UA: 0.2 mg/dL (ref 0.0–1.0)
pH: 6 (ref 5.0–8.0)

## 2014-09-03 MED ORDER — OMEPRAZOLE 20 MG PO CPDR: 20.0000 mg | DELAYED_RELEASE_CAPSULE | Freq: Every day | ORAL | Status: DC

## 2014-09-03 MED ORDER — GI COCKTAIL ~~LOC~~
30.0000 mL | Freq: Once | ORAL | Status: AC
  Administered 2014-09-03: 30 mL via ORAL
  Filled 2014-09-03: qty 30

## 2014-09-03 NOTE — Discharge Instructions (Signed)
Heartburn During Pregnancy  Heartburn happens when stomach acid goes up into the esophagus. The esophagus is the tube between the mouth and the stomach. This acid causes a burning pain in the chest or throat. This happens more often in the later part of pregnancy because the womb (uterus) gets larger. It may also happen because of hormone changes. Heartburn problems often go away after giving birth. HOME CARE  Take all medicine as told by your doctor.  Raise the head of your bed with blocks only as told by your doctor.  Do not exercise right after eating.  Avoid eating 2-3 hours before bed. Do not lie down right after eating.  Eat small meals throughout the day instead of 3 large meals.  Avoid foods that give you heartburn. Foods you may want to avoid include:  Peppers.  Chocolate.  High-fat foods, including fried foods.  Spicy foods.  Garlic and onions.  Citrus fruits, including oranges, grapefruit, lemons, and limes.  Food containing tomatoes or tomato products.  Mint.  Bubbly (carbonated) drinks and drinks with caffeine.  Vinegar. GET HELP IF:  You have any belly (abdominal) pain.  You feel burning in your upper belly or chest, especially after eating or lying down.  You feel sick to your stomach (nauseous) and throw up (vomit).  Your stomach feels upset after you eat. GET HELP RIGHT AWAY IF:  You have bad chest pain that goes down your arm or into your jaw or neck.  You feel sweaty, dizzy, or light-headed.  You have trouble breathing.  You throw up blood.  You have trouble or pain when swallowing.  You have bloody or black poop (stool).  You have heartburn more than 3 times a week, for more than 2 weeks. MAKE SURE YOU:  Understand these instructions.  Will watch your condition.  Will get help right away if you are not doing well or get worse. Document Released: 04/23/2010 Document Revised: 03/26/2013 Document Reviewed: 11/07/2012 [facility]  Patient Information 2015 [place], [place]. This information is not intended to replace advice given to you by your health care provider. Make sure you discuss any questions you have with your health care provider. Food Choices for Gastroesophageal Reflux Disease When you have gastroesophageal reflux disease (GERD), the foods you eat and your eating habits are very important. Choosing the right foods can help ease the discomfort of GERD. WHAT GENERAL GUIDELINES DO I NEED TO FOLLOW?  Choose fruits, vegetables, whole grains, low-fat dairy products, and low-fat meat, fish, and poultry.  Limit fats such as oils, salad dressings, butter, nuts, and avocado.  Keep a food diary to identify foods that cause symptoms.  Avoid foods that cause reflux. These may be different for different people.  Eat frequent small meals instead of three large meals each day.  Eat your meals slowly, in a relaxed setting.  Limit fried foods.  Cook foods using methods other than frying.  Avoid drinking alcohol.  Avoid drinking large amounts of liquids with your meals.  Avoid bending over or lying down until 2-3 hours after eating. WHAT FOODS ARE NOT RECOMMENDED? The following are some foods and drinks that may worsen your symptoms: Vegetables Tomatoes. Tomato juice. Tomato and spaghetti sauce. Chili peppers. Onion and garlic. Horseradish. Fruits Oranges, grapefruit, and lemon (fruit and juice). Meats High-fat meats, fish, and poultry. This includes hot dogs, ribs, ham, sausage, salami, and bacon. Dairy Whole milk and chocolate milk. Sour cream. Cream. Butter. Ice cream. Cream cheese.  Beverages Coffee and  tea, with or without caffeine. Carbonated beverages or energy drinks. Condiments Hot sauce. Barbecue sauce.  Sweets/Desserts Chocolate and cocoa. Donuts. Peppermint and spearmint. Fats and Oils High-fat foods, including [place] fries and potato chips. Other Vinegar. Strong spices, such as black pepper,  white pepper, red pepper, cayenne, curry powder, cloves, ginger, and chili powder. The items listed above may not be a complete list of foods and beverages to avoid. Contact your dietitian for more information. Document Released: 03/21/2005 Document Revised: 03/26/2013 Document Reviewed: 01/23/2013 [facility] Patient Information 2015 [place], [place]. This information is not intended to replace advice given to you by your health care provider. Make sure you discuss any questions you have with your health care provider.
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
## 2014-09-04 ENCOUNTER — Ambulatory Visit (INDEPENDENT_AMBULATORY_CARE_PROVIDER_SITE_OTHER): Payer: Medicaid Other | Admitting: Certified Nurse Midwife

## 2014-09-04 VITALS — BP 134/77 | HR 109 | Temp 98.3°F | Wt 134.0 lb

## 2014-09-04 DIAGNOSIS — Z3402 Encounter for supervision of normal first pregnancy, second trimester: Secondary | ICD-10-CM

## 2014-09-04 DIAGNOSIS — IMO0002 Reserved for concepts with insufficient information to code with codable children: Secondary | ICD-10-CM

## 2014-09-04 LAB — POCT URINALYSIS DIPSTICK
BILIRUBIN UA: NEGATIVE
Blood, UA: NEGATIVE
GLUCOSE UA: 250
KETONES UA: NEGATIVE
Leukocytes, UA: NEGATIVE
Nitrite, UA: NEGATIVE
PH UA: 6
PROTEIN UA: NEGATIVE
SPEC GRAV UA: 1.015
UROBILINOGEN UA: NEGATIVE

## 2014-09-05 DIAGNOSIS — IMO0002 Reserved for concepts with insufficient information to code with codable children: Secondary | ICD-10-CM | POA: Insufficient documentation
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
## 2014-09-08 ENCOUNTER — Encounter (HOSPITAL_COMMUNITY): Payer: Self-pay | Admitting: *Deleted

## 2014-09-08 ENCOUNTER — Inpatient Hospital Stay (HOSPITAL_COMMUNITY)
Admission: AD | Admit: 2014-09-08 | Discharge: 2014-09-08 | Disposition: A | Discharge status: Home / Self Care | Payer: Medicaid Other | Source: Ambulatory Visit | Attending: Obstetrics | Admitting: Obstetrics

## 2014-09-08 DIAGNOSIS — Z3A18 18 weeks gestation of pregnancy: Secondary | ICD-10-CM | POA: Diagnosis not present

## 2014-09-08 DIAGNOSIS — O26899 Other specified pregnancy related conditions, unspecified trimester: Secondary | ICD-10-CM

## 2014-09-08 DIAGNOSIS — O9989 Other specified diseases and conditions complicating pregnancy, childbirth and the puerperium: Secondary | ICD-10-CM | POA: Diagnosis not present

## 2014-09-08 DIAGNOSIS — R102 Pelvic and perineal pain: Secondary | ICD-10-CM | POA: Insufficient documentation

## 2014-09-08 DIAGNOSIS — N949 Unspecified condition associated with female genital organs and menstrual cycle: Secondary | ICD-10-CM

## 2014-09-08 DIAGNOSIS — R109 Unspecified abdominal pain: Secondary | ICD-10-CM | POA: Diagnosis not present

## 2014-09-08 HISTORY — DX: Unspecified infectious disease: B99.9

## 2014-09-08 HISTORY — DX: Headache: R51

## 2014-09-08 HISTORY — DX: Headache, unspecified: R51.9

## 2014-09-08 LAB — URINALYSIS, ROUTINE W REFLEX MICROSCOPIC
Bilirubin Urine: NEGATIVE
GLUCOSE, UA: NEGATIVE mg/dL
Hgb urine dipstick: NEGATIVE
KETONES UR: NEGATIVE mg/dL
Leukocytes, UA: NEGATIVE
Nitrite: NEGATIVE
PH: 6.5 (ref 5.0–8.0)
Protein, ur: NEGATIVE mg/dL
Specific Gravity, Urine: 1.02 (ref 1.005–1.030)
UROBILINOGEN UA: 0.2 mg/dL (ref 0.0–1.0)

## 2014-09-08 MED ORDER — HYDROCODONE-ACETAMINOPHEN 5-325 MG PO TABS: 1.0000 | ORAL_TABLET | ORAL | Status: DC | PRN

## 2014-09-08 MED ORDER — HYDROCODONE-ACETAMINOPHEN 5-325 MG PO TABS
1.0000 | ORAL_TABLET | Freq: Once | ORAL | Status: AC
  Administered 2014-09-08: 1 via ORAL
  Filled 2014-09-08: qty 1

## 2014-09-08 NOTE — Discharge Instructions (Signed)

## 2014-09-08 NOTE — MAU Note (Signed)
Sudden onset of sharp pain in lower abd.  Denies bleeding or leaking. No GI or GU complaints

## 2014-09-08 NOTE — Progress Notes (Signed)
Manual exam only, cervix long and closed

## 2014-09-08 NOTE — MAU Note (Signed)
abd soft, tender to touch  In lower abd

## 2014-09-08 NOTE — MAU Provider Note (Signed)
History     CSN: [id number]  Arrival date and time: 09/08/14 1247   First Provider Initiated Contact with Patient 09/08/14 1329      Chief Complaint  Patient presents with  . Abdominal Pain   HPI   [name] is a 24 y.o. female G3P0110 at [redacted]w[redacted]d who presents with abdominal pain that started last night. The pain is located in the bottom of her stomach; both sides of her lower abdomen. She did not take anything for the pain; she did not call her Dr. She called 911 and [facility] The pain is sharp, and the pain is constant. She has not taken anything for the pain.   She has had multiple visits to the MAU with this pregnancy; she has a history of a second trimester IUFD.   She currently rates her pain 9/10 The pain does not improve when she is laying down, although does worsen with movement.    The patient is present with a family member who states that Dr. Jodi Mourning told them that she would be admitted to the hospital for this pregnancy due her being high risk.    OB History    Gravida Para Term Preterm AB TAB SAB Ectopic Multiple Living   3 1 0 1 1 0 1 0 0 0       Past Medical History  Diagnosis Date  . Depression     depression  . Bipolar disorder   . ADHD (attention deficit hyperactivity disorder)   . Anxiety   . Anemia   . ADHD (attention deficit hyperactivity disorder)   . Bipolar 1 disorder   . Chlamydia 05-31-10  . Pseudocyesis 2013    Seen in MAU for percieved FM, abd distension. Normal exam.   . Gonorrhea contact, treated   . Pregnancy induced hypertension   . Headache   . Infection     UTI    Past Surgical History  Procedure Laterality Date  . Nasal septum surgery    . Cesarean section N/A 09/01/2012    Procedure:  Primary cesarean section with delivery of baby girl at 54.  Apgars 1/1.  ;  Surgeon: Frederico Hamman, MD;  Location: [place]  [facility];  Service: Obstetrics;  Laterality: N/A;    Family History  Problem Relation Age of Onset   . Kidney disease Mother   . Hypertension Father   . Cancer Sister   . Asthma Brother   . Heart disease Neg Hx   . Diabetes Maternal Grandfather     History  Substance Use Topics  . Smoking status: Never Smoker   . Smokeless tobacco: Never Used  . Alcohol Use: No    Allergies: No Known Allergies  Prescriptions prior to admission  Medication Sig Dispense Refill Last Dose  . docusate sodium (COLACE) 100 MG capsule Take 1 capsule (100 mg total) by mouth 2 (two) times daily. 60 capsule 11 Past Week at Unknown time  . omeprazole (PRILOSEC) 20 MG capsule Take 1 capsule (20 mg total) by mouth daily. 30 capsule 1   . polyethylene glycol (MIRALAX / GLYCOLAX) packet Take 17 g by mouth daily as needed. 14 each 2 Past Week at Unknown time  . Prenat-Fe Poly-Methfol-FA-DHA (VITAFOL ULTRA) 29-0.6-0.4-200 MG CAPS Take 1 capsule by mouth daily before breakfast. 90 capsule 3 09/02/2014 at Unknown time  . Prenat-FeFum-FePo-FA-Omega 3 (CONCEPT DHA) 53.5-38-1 MG CAPS Take 1 capsule by mouth daily. 30 capsule 11 09/02/2014 at Unknown time   Results  for orders placed or performed during the hospital encounter of 09/08/14 (from the past 48 hour(s))  Urinalysis, Routine w reflex microscopic (not at [facility])     Status: None   Collection Time: 09/08/14  1:35 PM  Result Value Ref Range   Color, Urine YELLOW YELLOW   APPearance CLEAR CLEAR   Specific Gravity, Urine 1.020 1.005 - 1.030   pH 6.5 5.0 - 8.0   Glucose, UA NEGATIVE NEGATIVE mg/dL   Hgb urine dipstick NEGATIVE NEGATIVE   Bilirubin Urine NEGATIVE NEGATIVE   Ketones, ur NEGATIVE NEGATIVE mg/dL   Protein, ur NEGATIVE NEGATIVE mg/dL   Urobilinogen, UA 0.2 0.0 - 1.0 mg/dL   Nitrite NEGATIVE NEGATIVE   Leukocytes, UA NEGATIVE NEGATIVE    Comment: MICROSCOPIC NOT DONE ON URINES WITH NEGATIVE PROTEIN, BLOOD, LEUKOCYTES, NITRITE, OR GLUCOSE <1000 mg/dL.    Review of Systems  Gastrointestinal: Positive for abdominal pain. Negative for diarrhea and  constipation.  Genitourinary:       Denies vaginal bleeding or leaking of fluid.  Denies intercourse recently.    Physical Exam   Blood pressure 118/69, pulse 78, temperature 98 F (36.7 C), temperature source Oral, resp. rate 16, last menstrual period 04/30/2014, SpO2 100 %.  Physical Exam  Constitutional: She is oriented to person, place, and time. She appears well-developed and well-nourished. No distress.  HENT:  Head: Normocephalic.  Eyes: Pupils are equal, round, and reactive to light.  Respiratory: Effort normal.  GI: She exhibits no distension and no mass. There is no tenderness. There is no rebound and no guarding.  Genitourinary:   Cervix closed, anterior.   Musculoskeletal: Normal range of motion.  Neurological: She is alert and oriented to person, place, and time.  Skin: Skin is warm. She is not diaphoretic.  Psychiatric: Her behavior is normal.    MAU Course  Procedures  none  MDM + fetal heart tones.   1 vicodin given PO; pain down to a 4/10 from 9/10  Assessment and Plan   A:  1. Abdominal pain in pregnancy   2. Pain of round ligament     P:  Discharge home in stable condition Vicodin #6 no refill Return to MAU if symptoms worsen Follow up with Dr. Jodi Mourning as scheduled  Pregnancy support belt recommended   Lezlie Lye, NP 09/08/2014 1:31 PM

## 2014-09-09 ENCOUNTER — Ambulatory Visit (HOSPITAL_COMMUNITY)
Admission: RE | Admit: 2014-09-09 | Discharge: 2014-09-09 | Disposition: A | Discharge status: Home / Self Care | Payer: Medicaid Other | Source: Ambulatory Visit | Attending: Obstetrics | Admitting: Obstetrics

## 2014-09-09 DIAGNOSIS — Z36 Encounter for antenatal screening of mother: Secondary | ICD-10-CM | POA: Insufficient documentation

## 2014-09-09 DIAGNOSIS — O3421 Maternal care for scar from previous cesarean delivery: Secondary | ICD-10-CM | POA: Insufficient documentation

## 2014-09-09 DIAGNOSIS — O09212 Supervision of pregnancy with history of pre-term labor, second trimester: Secondary | ICD-10-CM | POA: Insufficient documentation

## 2014-09-09 DIAGNOSIS — O09292 Supervision of pregnancy with other poor reproductive or obstetric history, second trimester: Secondary | ICD-10-CM | POA: Diagnosis not present

## 2014-09-09 DIAGNOSIS — O09293 Supervision of pregnancy with other poor reproductive or obstetric history, third trimester: Secondary | ICD-10-CM

## 2014-09-09 DIAGNOSIS — Z3689 Encounter for other specified antenatal screening: Secondary | ICD-10-CM | POA: Insufficient documentation

## 2014-09-09 DIAGNOSIS — Z3A18 18 weeks gestation of pregnancy: Secondary | ICD-10-CM | POA: Diagnosis not present

## 2014-09-09 DIAGNOSIS — O09213 Supervision of pregnancy with history of pre-term labor, third trimester: Secondary | ICD-10-CM

## 2014-09-10 ENCOUNTER — Ambulatory Visit (INDEPENDENT_AMBULATORY_CARE_PROVIDER_SITE_OTHER): Payer: Medicaid Other | Admitting: Certified Nurse Midwife

## 2014-09-10 VITALS — BP 126/81 | HR 109 | Temp 98.3°F | Wt 132.0 lb

## 2014-09-10 DIAGNOSIS — Z3402 Encounter for supervision of normal first pregnancy, second trimester: Secondary | ICD-10-CM | POA: Diagnosis not present

## 2014-09-10 LAB — POCT URINALYSIS DIPSTICK
BILIRUBIN UA: NEGATIVE
Blood, UA: NEGATIVE
GLUCOSE UA: 50
KETONES UA: NEGATIVE
Leukocytes, UA: NEGATIVE
Nitrite, UA: NEGATIVE
Protein, UA: NEGATIVE
Spec Grav, UA: 1.015
UROBILINOGEN UA: NEGATIVE
pH, UA: 6
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
## 2014-09-14 ENCOUNTER — Emergency Department (HOSPITAL_COMMUNITY)
Admission: EM | Admit: 2014-09-14 | Discharge: 2014-09-14 | Disposition: A | Discharge status: Home / Self Care | Payer: Medicaid Other | Attending: Emergency Medicine | Admitting: Emergency Medicine

## 2014-09-14 ENCOUNTER — Encounter (HOSPITAL_COMMUNITY): Payer: Self-pay | Admitting: Physical Medicine and Rehabilitation

## 2014-09-14 DIAGNOSIS — O9A212 Injury, poisoning and certain other consequences of external causes complicating pregnancy, second trimester: Secondary | ICD-10-CM | POA: Diagnosis present

## 2014-09-14 DIAGNOSIS — D649 Anemia, unspecified: Secondary | ICD-10-CM | POA: Diagnosis not present

## 2014-09-14 DIAGNOSIS — W1839XA Other fall on same level, initial encounter: Secondary | ICD-10-CM | POA: Diagnosis not present

## 2014-09-14 DIAGNOSIS — Z3A20 20 weeks gestation of pregnancy: Secondary | ICD-10-CM | POA: Diagnosis not present

## 2014-09-14 DIAGNOSIS — Y998 Other external cause status: Secondary | ICD-10-CM | POA: Diagnosis not present

## 2014-09-14 DIAGNOSIS — Z8619 Personal history of other infectious and parasitic diseases: Secondary | ICD-10-CM | POA: Insufficient documentation

## 2014-09-14 DIAGNOSIS — O9989 Other specified diseases and conditions complicating pregnancy, childbirth and the puerperium: Secondary | ICD-10-CM | POA: Insufficient documentation

## 2014-09-14 DIAGNOSIS — Y9389 Activity, other specified: Secondary | ICD-10-CM | POA: Insufficient documentation

## 2014-09-14 DIAGNOSIS — Z8659 Personal history of other mental and behavioral disorders: Secondary | ICD-10-CM | POA: Insufficient documentation

## 2014-09-14 DIAGNOSIS — R55 Syncope and collapse: Secondary | ICD-10-CM | POA: Diagnosis not present

## 2014-09-14 DIAGNOSIS — W19XXXA Unspecified fall, initial encounter: Secondary | ICD-10-CM

## 2014-09-14 DIAGNOSIS — Z7982 Long term (current) use of aspirin: Secondary | ICD-10-CM | POA: Insufficient documentation

## 2014-09-14 DIAGNOSIS — Y92009 Unspecified place in unspecified non-institutional (private) residence as the place of occurrence of the external cause: Secondary | ICD-10-CM | POA: Insufficient documentation

## 2014-09-14 DIAGNOSIS — S3991XA Unspecified injury of abdomen, initial encounter: Secondary | ICD-10-CM | POA: Diagnosis not present

## 2014-09-14 DIAGNOSIS — R110 Nausea: Secondary | ICD-10-CM | POA: Insufficient documentation

## 2014-09-14 DIAGNOSIS — Z87440 Personal history of urinary (tract) infections: Secondary | ICD-10-CM | POA: Insufficient documentation

## 2014-09-14 DIAGNOSIS — Z79899 Other long term (current) drug therapy: Secondary | ICD-10-CM | POA: Diagnosis not present

## 2014-09-14 DIAGNOSIS — O99012 Anemia complicating pregnancy, second trimester: Secondary | ICD-10-CM | POA: Diagnosis not present

## 2014-09-14 DIAGNOSIS — Z3490 Encounter for supervision of normal pregnancy, unspecified, unspecified trimester: Secondary | ICD-10-CM

## 2014-09-14 LAB — COMPREHENSIVE METABOLIC PANEL
ALK PHOS: 51 U/L (ref 38–126)
ALT: 33 U/L (ref 14–54)
AST: 24 U/L (ref 15–41)
Albumin: 3 g/dL — ABNORMAL LOW (ref 3.5–5.0)
Anion gap: 7 (ref 5–15)
BUN: <5 mg/dL — ABNORMAL LOW (ref 6–20)
CALCIUM: 8.7 mg/dL — AB (ref 8.9–10.3)
CHLORIDE: 108 mmol/L (ref 101–111)
CO2: 20 mmol/L — ABNORMAL LOW (ref 22–32)
CREATININE: 0.48 mg/dL (ref 0.44–1.00)
GFR calc non Af Amer: >60 mL/min (ref >60)
Glucose, Bld: 96 mg/dL (ref 65–99)
Potassium: 3.8 mmol/L (ref 3.5–5.1)
Sodium: 135 mmol/L (ref 135–145)
TOTAL PROTEIN: 6.5 g/dL (ref 6.5–8.1)
Total Bilirubin: 0.5 mg/dL (ref 0.3–1.2)

## 2014-09-14 LAB — CBC
HEMATOCRIT: 34.5 % — AB (ref 36.0–46.0)
Hemoglobin: 11.3 g/dL — ABNORMAL LOW (ref 12.0–15.0)
MCH: 26.2 pg (ref 26.0–34.0)
MCHC: 32.8 g/dL (ref 30.0–36.0)
MCV: 79.9 fL (ref 78.0–100.0)
PLATELETS: 180 10*3/uL (ref 150–400)
RBC: 4.32 MIL/uL (ref 3.87–5.11)
RDW: 14.6 % (ref 11.5–15.5)
WBC: 7.1 10*3/uL (ref 4.0–10.5)

## 2014-09-14 NOTE — ED Notes (Addendum)
Pt presents to department from home via [facility] for evaluation of fall, syncope and lower abdominal pain. Pt reports lower abdominal pain x1 week, was seen at [facility] recently for same. Reports she was walking to bathroom today when she fell and passed out. CBG 94. 20g L hand. Pt is alert and oriented x4. Pt x6 months pregnant. Denies vaginal bleeding.

## 2014-09-14 NOTE — ED Notes (Signed)
Rapid OB RN at bedside.  

## 2014-09-14 NOTE — Progress Notes (Signed)
Spoke with Dr. Ruthann Cancer. Labs are normal. Pt is obstetrically cleared and can be dc'd home.

## 2014-09-14 NOTE — Discharge Instructions (Signed)
It was our pleasure to provide your ER care today - we hope that you feel better.  Rest. Drink plenty of fluids.  Eat balanced diet.  Follow up with your ob/gyn doctor in coming week.   Go directly to [facility] if any problems with pregnancy, vaginal bleeding/fluid, pelvic or lower abdominal pain, other concern.  Return to ER if new symptoms, fevers, persistent vomiting, medical emergency, other concern.

## 2014-09-14 NOTE — ED Provider Notes (Signed)
CSN: [id number]     Arrival date & time 09/14/14  1318 History   First MD Initiated Contact with Patient 09/14/14 1319     Chief Complaint  Patient presents with  . Abdominal Pain  . Fall  . Loss of Consciousness     (Consider location/radiation/quality/duration/timing/severity/associated sxs/prior Treatment) Patient is a 24 y.o. female presenting with abdominal pain, fall, and syncope. The history is provided by the patient.  Abdominal Pain Associated symptoms: no chest pain, no chills, no cough, no diarrhea, no dysuria, no fever, no shortness of breath, no sore throat, no vaginal bleeding, no vaginal discharge and no vomiting   Fall Associated symptoms include abdominal pain. Pertinent negatives include no chest pain, no headaches and no shortness of breath.  Loss of Consciousness Associated symptoms: no chest pain, no confusion, no fever, no headaches, no shortness of breath, no vomiting and no weakness   Patient g3p1, 6 months pregnant stating due date 11/2, presents s/p fall/syncopal event. w prior pregnancies, 1 prior miscarriage, and 1 delivery at 6 months due to preeclampsia. Patient states all day today has felt nauseated, and ate/drank very little.  Pt was on way to bathroom, stood, felt faint, states did not make it too bathroom, fainted. w fall, denies specific new pain or injury. No headache. No neck or back pain. Pt states had gone to [facility] earlier in week for evaluation abd pain and was told everything was ok.  No acute or abrupt change in abd pain. No vaginal fluid or bleeding.      Past Medical History  Diagnosis Date  . Depression     depression  . Bipolar disorder   . ADHD (attention deficit hyperactivity disorder)   . Anxiety   . Anemia   . ADHD (attention deficit hyperactivity disorder)   . Bipolar 1 disorder   . Chlamydia 05-31-10  . Pseudocyesis 2013    Seen in MAU for percieved FM, abd distension. Normal exam.   . Gonorrhea contact, treated   . Pregnancy  induced hypertension   . Headache   . Infection     UTI   Past Surgical History  Procedure Laterality Date  . Nasal septum surgery    . Cesarean section N/A 09/01/2012    Procedure:  Primary cesarean section with delivery of baby girl at 69.  Apgars 1/1.  ;  Surgeon: Frederico Hamman, MD;  Location: [place] [facility];  Service: Obstetrics;  Laterality: N/A;   Family History  Problem Relation Age of Onset  . Kidney disease Mother   . Hypertension Father   . Cancer Sister   . Asthma Brother   . Heart disease Neg Hx   . Diabetes Maternal Grandfather    History  Substance Use Topics  . Smoking status: Never Smoker   . Smokeless tobacco: Never Used  . Alcohol Use: No   OB History    Gravida Para Term Preterm AB TAB SAB Ectopic Multiple Living   3 1 0 1 1 0 1 0 0 0      Review of Systems  Constitutional: Negative for fever and chills.  HENT: Negative for sore throat.   Eyes: Negative for visual disturbance.  Respiratory: Negative for cough and shortness of breath.   Cardiovascular: Positive for syncope. Negative for chest pain and leg swelling.  Gastrointestinal: Positive for abdominal pain. Negative for vomiting and diarrhea.  Endocrine: Negative for polyuria.  Genitourinary: Negative for dysuria, flank pain, vaginal bleeding and vaginal discharge.  Musculoskeletal: Negative for back  pain and neck pain.  Skin: Negative for wound.  Neurological: Negative for weakness, numbness and headaches.  Hematological: Does not bruise/bleed easily.  Psychiatric/Behavioral: Negative for confusion.      Allergies  Review of patient's allergies indicates no known allergies.  Home Medications   Prior to Admission medications   Medication Sig Start Date End Date Taking? Authorizing Provider  aspirin 81 MG tablet Take 81 mg by mouth daily.    Historical Provider, MD  HYDROcodone-acetaminophen (NORCO/VICODIN) 5-325 MG per tablet Take 1 tablet by mouth every 4 (four) hours as needed. 09/08/14    Lezlie Lye, NP  Prenat-FeFum-FePo-FA-Omega 3 (CONCEPT DHA) 53.5-38-1 MG CAPS Take 1 capsule by mouth daily. 08/27/14   Shelly Bombard, MD   LMP 04/30/2014 Physical Exam  Constitutional: She is oriented to person, place, and time. She appears well-developed and well-nourished. No distress.  HENT:  Head: Atraumatic.  Eyes: Conjunctivae are normal. Pupils are equal, round, and reactive to light. No scleral icterus.  Neck: Normal range of motion. Neck supple. No tracheal deviation present.  Cardiovascular: Normal rate, regular rhythm, normal heart sounds and intact distal pulses.   Pulmonary/Chest: Effort normal and breath sounds normal. No respiratory distress.  Abdominal: Soft. Normal appearance and bowel sounds are normal. She exhibits no mass. There is no tenderness. There is no rebound and no guarding.  Fundal ht c/w stated gest age. abd soft nt.   Genitourinary:  No cva or flank tenderness.   Musculoskeletal: She exhibits no edema or tenderness.  CTLS spine, non tender, aligned, no step off. Good rom bil ext without pain or focal bony tenderness.   Neurological: She is alert and oriented to person, place, and time.  Motor intact bil, stre 5/5. sens grossly intact.   Skin: Skin is warm and dry. No rash noted. She is not diaphoretic.  Psychiatric: She has a normal mood and affect.  Nursing note and vitals reviewed.   ED Course  Procedures (including critical care time) Labs Review  Results for orders placed or performed during the hospital encounter of 09/14/14  CBC  Result Value Ref Range   WBC 7.1 4.0 - 10.5 K/uL   RBC 4.32 3.87 - 5.11 MIL/uL   Hemoglobin 11.3 (L) 12.0 - 15.0 g/dL   HCT 34.5 (L) 36.0 - 46.0 %   MCV 79.9 78.0 - 100.0 fL   MCH 26.2 26.0 - 34.0 pg   MCHC 32.8 30.0 - 36.0 g/dL   RDW 14.6 11.5 - 15.5 %   Platelets 180 150 - 400 K/uL  Comprehensive metabolic panel  Result Value Ref Range   Sodium 135 135 - 145 mmol/L   Potassium 3.8 3.5 - 5.1 mmol/L    Chloride 108 101 - 111 mmol/L   CO2 20 (L) 22 - 32 mmol/L   Glucose, Bld 96 65 - 99 mg/dL   BUN <5 (L) 6 - 20 mg/dL   Creatinine, Ser 0.48 0.44 - 1.00 mg/dL   Calcium 8.7 (L) 8.9 - 10.3 mg/dL   Total Protein 6.5 6.5 - 8.1 g/dL   Albumin 3.0 (L) 3.5 - 5.0 g/dL   AST 24 15 - 41 U/L   ALT 33 14 - 54 U/L   Alkaline Phosphatase 51 38 - 126 U/L   Total Bilirubin 0.5 0.3 - 1.2 mg/dL   GFR calc non Af Amer >60 >60 mL/min   GFR calc Af Amer >60 >60 mL/min   Anion gap 7 5 - 15   US Ob  Detail + 14 Wk  09/09/2014   OBSTETRICAL ULTRASOUND: This exam was performed within a [facility]. The OB US report was generated in the AS system, and faxed to the ordering physician.   This report is available in the [facility]. See the [facility] Obstetric US report via the Image Link.      EKG Interpretation   Date/Time:  Sunday September 14 2014 13:24:26 EDT Ventricular Rate:  107 PR Interval:  150 QRS Duration: 71 QT Interval:  336 QTC Calculation: 448 R Axis:   40 Text Interpretation:  Sinus tachycardia Nonspecific T wave abnormality No  previous tracing Confirmed by Ashok Cordia  MD, Lennette Bihari ([id number]) on 09/14/2014  1:38:33 PM      MDM   Iv ns. Monitor.  Labs.  Rapid response nurse from [facility] present, fetal monitoring.   Recheck pt, abd soft no tenderness. Afeb. Tolerating po fluids.  Ob/gyn team has assessed/monitored, and they indicate clear for d/c, and that f/u is already arranged.   Recheck, bp/vitals normal. No new c/o. No fevers. abd soft nt. No emesis.   Pt currently appears stable for d/c.  Return precautions provided.       Lajean Saver, MD 09/14/14 1452

## 2014-09-14 NOTE — ED Notes (Signed)
Pt given turkey sandwich and sprite.  

## 2014-09-14 NOTE — Progress Notes (Addendum)
Spoke with Dr. Ruthann Cancer. Pt is a G3 P0 with a hx of a miscarriage of twins the 1st pregnacy, the 2nd pregnancy, pt says she had a c/s for preeclampsia at 6 1/2 months. Says that baby lived for 3 days and then died. She says she had abd pain and then passed out. She did not fall on her abd. FHR is 166BPM. No uc's . Labs are being drawn, CBC, CMP. UA will be sent. Will monitor pt for uc's until lab results are back. Bloodpressures are 131/90 and 136/95. Denies seeing spots or blurred vision.

## 2014-09-14 NOTE — Progress Notes (Signed)
Pt is a G3 P0. Pt says she had a misscarrage with twins her 1st pregnancy, a C/S with her 2nd pregnancy at 6 1/2 months per pt for preeclampsia. She says she had abd pain and then passed out. She did not fall on her abd. Says she woke up and was lying on her back. No vaginal bleeding or leaking of fluid. Says she is still having abd pain. Will monitor for uc's.

## 2014-09-16 ENCOUNTER — Encounter: Payer: Medicaid Other | Admitting: Obstetrics

## 2014-09-22 ENCOUNTER — Encounter: Payer: Self-pay | Admitting: Obstetrics

## 2014-09-22 ENCOUNTER — Ambulatory Visit (INDEPENDENT_AMBULATORY_CARE_PROVIDER_SITE_OTHER): Payer: Medicaid Other | Admitting: Obstetrics

## 2014-09-22 VITALS — BP 132/75 | HR 90 | Temp 97.7°F | Wt 135.0 lb

## 2014-09-22 DIAGNOSIS — Z3482 Encounter for supervision of other normal pregnancy, second trimester: Secondary | ICD-10-CM | POA: Diagnosis not present

## 2014-09-22 LAB — POCT URINALYSIS DIPSTICK
BILIRUBIN UA: NEGATIVE
Glucose, UA: NEGATIVE
Ketones, UA: NEGATIVE
Leukocytes, UA: NEGATIVE
Nitrite, UA: NEGATIVE
PH UA: 6
Protein, UA: NEGATIVE
RBC UA: NEGATIVE
SPEC GRAV UA: 1.02
Urobilinogen, UA: NEGATIVE
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
## 2014-09-23 ENCOUNTER — Encounter: Payer: Medicaid Other | Admitting: Obstetrics

## 2014-09-27 ENCOUNTER — Inpatient Hospital Stay (HOSPITAL_COMMUNITY)
Admission: AD | Admit: 2014-09-27 | Discharge: 2014-09-27 | Disposition: A | Discharge status: Home / Self Care | Payer: Medicaid Other | Source: Ambulatory Visit | Attending: Obstetrics | Admitting: Obstetrics

## 2014-09-27 ENCOUNTER — Encounter (HOSPITAL_COMMUNITY): Payer: Self-pay | Admitting: *Deleted

## 2014-09-27 DIAGNOSIS — R109 Unspecified abdominal pain: Secondary | ICD-10-CM | POA: Diagnosis not present

## 2014-09-27 DIAGNOSIS — O9989 Other specified diseases and conditions complicating pregnancy, childbirth and the puerperium: Secondary | ICD-10-CM

## 2014-09-27 DIAGNOSIS — N898 Other specified noninflammatory disorders of vagina: Secondary | ICD-10-CM | POA: Diagnosis not present

## 2014-09-27 DIAGNOSIS — O26892 Other specified pregnancy related conditions, second trimester: Secondary | ICD-10-CM

## 2014-09-27 DIAGNOSIS — R599 Enlarged lymph nodes, unspecified: Secondary | ICD-10-CM | POA: Diagnosis not present

## 2014-09-27 DIAGNOSIS — Z3A21 21 weeks gestation of pregnancy: Secondary | ICD-10-CM | POA: Diagnosis not present

## 2014-09-27 DIAGNOSIS — O26899 Other specified pregnancy related conditions, unspecified trimester: Secondary | ICD-10-CM

## 2014-09-27 LAB — URINALYSIS, ROUTINE W REFLEX MICROSCOPIC
BILIRUBIN URINE: NEGATIVE
Glucose, UA: NEGATIVE mg/dL
Hgb urine dipstick: NEGATIVE
Ketones, ur: 15 mg/dL — AB
LEUKOCYTES UA: NEGATIVE
NITRITE: NEGATIVE
Protein, ur: NEGATIVE mg/dL
Specific Gravity, Urine: 1.025 (ref 1.005–1.030)
Urobilinogen, UA: 0.2 mg/dL (ref 0.0–1.0)
pH: 6.5 (ref 5.0–8.0)

## 2014-09-27 LAB — WET PREP, GENITAL
Clue Cells Wet Prep HPF POC: NONE SEEN
Trich, Wet Prep: NONE SEEN
Yeast Wet Prep HPF POC: NONE SEEN

## 2014-09-27 NOTE — Discharge Instructions (Signed)

## 2014-09-27 NOTE — MAU Provider Note (Signed)
History     CSN: [id number]  Arrival date and time: 09/27/14 1309   First Provider Initiated Contact with Patient 09/27/14 1345      Chief Complaint  Patient presents with  . Abdominal Pain  . Sore Throat   HPI  Stephanie Mack is a 24 y.o. G3P0110 at [redacted]w[redacted]d. She presents with multiple c/o. She woke up with a knot under her chin, it is tender, doesn't know what it is. She has white odorus discharge- is thin white, fishy odor, no itching. She continues to have low abd pressure off/on- had had since before her last PNV, was told everything is ok, she is not sure. Good FM, no leaking or bleeding. She had Chlamydia 08/21/14, her partner went to jail 5/30- he had other partners. Hx 26+ wk emergent C/S for pre eclamspia with fetal loss. She is starting to get more anxious about that.  OB History    Gravida Para Term Preterm AB TAB SAB Ectopic Multiple Living   3 1 0 1 1 0 1 0 0 0       Past Medical History  Diagnosis Date  . Depression     depression  . Bipolar disorder   . ADHD (attention deficit hyperactivity disorder)   . Anxiety   . Anemia   . ADHD (attention deficit hyperactivity disorder)   . Bipolar 1 disorder   . Chlamydia 05-31-10  . Pseudocyesis 2013    Seen in MAU for percieved FM, abd distension. Normal exam.   . Gonorrhea contact, treated   . Pregnancy induced hypertension   . Headache   . Infection     UTI    Past Surgical History  Procedure Laterality Date  . Nasal septum surgery    . Cesarean section N/A 09/01/2012    Procedure:  Primary cesarean section with delivery of baby girl at 2.  Apgars 1/1.  ;  Surgeon: Frederico Hamman, MD;  Location: [facility];  Service: Obstetrics;  Laterality: N/A;    Family History  Problem Relation Age of Onset  . Kidney disease Mother   . Hypertension Father   . Cancer Sister   . Asthma Brother   . Heart disease Neg Hx   . Diabetes Maternal Grandfather     History  Substance Use Topics  . Smoking status: Never  Smoker   . Smokeless tobacco: Never Used  . Alcohol Use: No    Allergies: No Known Allergies  Prescriptions prior to admission  Medication Sig Dispense Refill Last Dose  . aspirin 81 MG tablet Take 81 mg by mouth daily.   09/27/2014 at Unknown time  . Prenat-FeFum-FePo-FA-Omega 3 (CONCEPT DHA) 53.5-38-1 MG CAPS Take 1 capsule by mouth daily. 30 capsule 11 09/27/2014 at Unknown time  . HYDROcodone-acetaminophen (NORCO/VICODIN) 5-325 MG per tablet Take 1 tablet by mouth every 4 (four) hours as needed. (Patient not taking: Reported on 09/14/2014) 6 tablet 0 Completed Course at Unknown time    Review of Systems  Constitutional: Negative for fever and chills.  HENT: Negative for congestion and sore throat.   Gastrointestinal: Negative for nausea, vomiting, diarrhea and constipation.  Genitourinary: Negative for dysuria, urgency and frequency.       +discharge and odor - itching  Neurological: Negative for headaches.  Endo/Heme/Allergies:       Firm tender mass under chin   Physical Exam   Blood pressure 118/65, pulse 89, temperature 97.7 F (36.5 C), temperature source Oral, resp. rate 16, height 5' 1.5" (1.562 m),  weight 60.555 kg (133 lb 8 oz), last menstrual period 04/30/2014.  Physical Exam  Nursing note and vitals reviewed. Constitutional: She is oriented to person, place, and time. She appears well-developed and well-nourished.  HENT:  Head: Normocephalic and atraumatic.  Mouth/Throat: Oropharynx is clear and moist.  Neck: Normal range of motion. Neck supple. No tracheal deviation present. No thyromegaly present.  ~ 1 cm firm sl tender mass under chin  GI: Soft. She exhibits no distension. There is no tenderness.  1/U, FHR 166  Genitourinary:  Pelvic Exam: Ext gen- nl anatomy, skin intact Vagina- mod amt creamy white discharge Cx- closed, long, firm Uterus- enlarged ~ 22 wk size Adn- non tender  Musculoskeletal: Normal range of motion.  Neurological: She is alert and  oriented to person, place, and time.  Skin: Skin is warm and dry.  Psychiatric: She has a normal mood and affect. Her behavior is normal.    MAU Course  Procedures  MDM Results for orders placed or performed during the hospital encounter of 09/27/14 (from the past 24 hour(s))  Urinalysis, Routine w reflex microscopic (not at [facility])     Status: Abnormal   Collection Time: 09/27/14  1:24 PM  Result Value Ref Range   Color, Urine YELLOW YELLOW   APPearance CLEAR CLEAR   Specific Gravity, Urine 1.025 1.005 - 1.030   pH 6.5 5.0 - 8.0   Glucose, UA NEGATIVE NEGATIVE mg/dL   Hgb urine dipstick NEGATIVE NEGATIVE   Bilirubin Urine NEGATIVE NEGATIVE   Ketones, ur 15 (A) NEGATIVE mg/dL   Protein, ur NEGATIVE NEGATIVE mg/dL   Urobilinogen, UA 0.2 0.0 - 1.0 mg/dL   Nitrite NEGATIVE NEGATIVE   Leukocytes, UA NEGATIVE NEGATIVE  Wet prep, genital     Status: Abnormal   Collection Time: 09/27/14  2:00 PM  Result Value Ref Range   Yeast Wet Prep HPF POC NONE SEEN NONE SEEN   Trich, Wet Prep NONE SEEN NONE SEEN   Clue Cells Wet Prep HPF POC NONE SEEN NONE SEEN   WBC, Wet Prep HPF POC FEW (A) NONE SEEN     Assessment and Plan  21 3/7 wks with low abd pain, vaginal discharge, knot under her chin Urine and wet prep are negative GC/CT pending Slightly enlarged lymph node under her chin Keep next scheduled PNV   Stephanie Mack M. 09/27/2014, 1:51 PM

## 2014-09-27 NOTE — MAU Note (Signed)
Pt states since Monday, pt has had intermittent lower abd pain. Denies bleeding. White odorous discharge. Throat swollen since yesterday.

## 2014-09-28 LAB — HIV ANTIBODY (ROUTINE TESTING W REFLEX): HIV Screen 4th Generation wRfx: NONREACTIVE

## 2014-09-29 ENCOUNTER — Encounter (HOSPITAL_COMMUNITY): Payer: Self-pay | Admitting: *Deleted

## 2014-09-29 ENCOUNTER — Inpatient Hospital Stay (HOSPITAL_COMMUNITY)
Admission: AD | Admit: 2014-09-29 | Discharge: 2014-09-29 | Disposition: A | Discharge status: Home / Self Care | Payer: Medicaid Other | Source: Ambulatory Visit | Attending: Obstetrics | Admitting: Obstetrics

## 2014-09-29 DIAGNOSIS — M549 Dorsalgia, unspecified: Secondary | ICD-10-CM | POA: Diagnosis present

## 2014-09-29 DIAGNOSIS — Z3A22 22 weeks gestation of pregnancy: Secondary | ICD-10-CM | POA: Diagnosis not present

## 2014-09-29 DIAGNOSIS — O9989 Other specified diseases and conditions complicating pregnancy, childbirth and the puerperium: Secondary | ICD-10-CM | POA: Diagnosis not present

## 2014-09-29 DIAGNOSIS — Z3A00 Weeks of gestation of pregnancy not specified: Secondary | ICD-10-CM | POA: Diagnosis not present

## 2014-09-29 LAB — GC/CHLAMYDIA PROBE AMP (~~LOC~~) NOT AT ARMC
Chlamydia: NEGATIVE
Neisseria Gonorrhea: NEGATIVE

## 2014-09-29 LAB — URINALYSIS, ROUTINE W REFLEX MICROSCOPIC
Bilirubin Urine: NEGATIVE
Glucose, UA: NEGATIVE mg/dL
HGB URINE DIPSTICK: NEGATIVE
KETONES UR: NEGATIVE mg/dL
Leukocytes, UA: NEGATIVE
Nitrite: NEGATIVE
Protein, ur: NEGATIVE mg/dL
Specific Gravity, Urine: 1.015 (ref 1.005–1.030)
UROBILINOGEN UA: 0.2 mg/dL (ref 0.0–1.0)
pH: 7 (ref 5.0–8.0)

## 2014-09-29 MED ORDER — OXYCODONE-ACETAMINOPHEN 5-325 MG PO TABS
1.0000 | ORAL_TABLET | Freq: Once | ORAL | Status: AC
  Administered 2014-09-29: 1 via ORAL
  Filled 2014-09-29: qty 1

## 2014-09-29 NOTE — MAU Note (Signed)
Pt presents complaining of back pain and upper stomach pain that started around noon. Pt states she isn't feeling fetal movement. Denies vaginal bleeding or discharge.

## 2014-09-29 NOTE — MAU Provider Note (Signed)
History   24 yo G3P0110 prev c/s x 1 in with c/o back pain that started today.  CSN: [id number]  Arrival date and time: 09/29/14 1305   None     Chief Complaint  Patient presents with  . Abdominal Pain   HPI  OB History    Gravida Para Term Preterm AB TAB SAB Ectopic Multiple Living   3 1 0 1 1 0 1 0 0 0       Past Medical History  Diagnosis Date  . Depression     depression  . Bipolar disorder   . ADHD (attention deficit hyperactivity disorder)   . Anxiety   . Anemia   . ADHD (attention deficit hyperactivity disorder)   . Bipolar 1 disorder   . Chlamydia 05-31-10  . Pseudocyesis 2013    Seen in MAU for percieved FM, abd distension. Normal exam.   . Gonorrhea contact, treated   . Pregnancy induced hypertension   . Headache   . Infection     UTI    Past Surgical History  Procedure Laterality Date  . Nasal septum surgery    . Cesarean section N/A 09/01/2012    Procedure:  Primary cesarean section with delivery of baby girl at 90.  Apgars 1/1.  ;  Surgeon: Frederico Hamman, MD;  Location: [facility];  Service: Obstetrics;  Laterality: N/A;    Family History  Problem Relation Age of Onset  . Kidney disease Mother   . Hypertension Father   . Cancer Sister   . Asthma Brother   . Heart disease Neg Hx   . Diabetes Maternal Grandfather     History  Substance Use Topics  . Smoking status: Never Smoker   . Smokeless tobacco: Never Used  . Alcohol Use: No    Allergies: No Known Allergies  Prescriptions prior to admission  Medication Sig Dispense Refill Last Dose  . aspirin 81 MG tablet Take 81 mg by mouth daily.   09/27/2014 at Unknown time  . Prenat-FeFum-FePo-FA-Omega 3 (CONCEPT DHA) 53.5-38-1 MG CAPS Take 1 capsule by mouth daily. 30 capsule 11 09/27/2014 at Unknown time    Review of Systems  Constitutional: Negative.   HENT: Negative.   Eyes: Negative.   Respiratory: Negative.   Cardiovascular: Negative.   Gastrointestinal: Negative.    Genitourinary: Negative.   Musculoskeletal: Positive for back pain.  Skin: Negative.   Neurological: Negative.   Endo/Heme/Allergies: Negative.   Psychiatric/Behavioral: Negative.    Physical Exam   Blood pressure 109/67, pulse 107, temperature 98 F (36.7 C), temperature source Oral, resp. rate 18, height 5\' 1"  (1.549 m), weight 134 lb (60.782 kg), last menstrual period 04/30/2014.  Physical Exam  Constitutional: She is oriented to person, place, and time. She appears well-developed and well-nourished.  HENT:  Head: Normocephalic.  Eyes: Pupils are equal, round, and reactive to light.  Neck: Normal range of motion.  Cardiovascular: Normal rate, regular rhythm, normal heart sounds and intact distal pulses.   Respiratory: Effort normal and breath sounds normal.  GI: Soft. Bowel sounds are normal.  Genitourinary: Vagina normal.  Musculoskeletal: Normal range of motion.  Neurological: She is alert and oriented to person, place, and time. She has normal reflexes.  Skin: Skin is warm and dry.  Psychiatric: She has a normal mood and affect. Her behavior is normal. Judgment and thought content normal.    MAU Course  Procedures  MDM Back pain in pregnancy  Assessment and Plan  U/a to r/o uti, pain  management and encouraged pt to make appt with Dr. Jodi Mourning to discuss pain management  LAWSON, MARIE [name] 09/29/2014, 1:25 PM

## 2014-09-29 NOTE — Discharge Instructions (Signed)

## 2014-10-02 ENCOUNTER — Inpatient Hospital Stay (HOSPITAL_COMMUNITY)
Admission: AD | Admit: 2014-10-02 | Discharge: 2014-10-02 | Disposition: A | Discharge status: Home / Self Care | Payer: Medicaid Other | Source: Ambulatory Visit | Attending: Obstetrics | Admitting: Obstetrics

## 2014-10-02 ENCOUNTER — Encounter (HOSPITAL_COMMUNITY): Payer: Self-pay | Admitting: *Deleted

## 2014-10-02 DIAGNOSIS — N859 Noninflammatory disorder of uterus, unspecified: Secondary | ICD-10-CM

## 2014-10-02 DIAGNOSIS — N858 Other specified noninflammatory disorders of uterus: Secondary | ICD-10-CM

## 2014-10-02 DIAGNOSIS — Z3493 Encounter for supervision of normal pregnancy, unspecified, third trimester: Secondary | ICD-10-CM | POA: Diagnosis not present

## 2014-10-02 LAB — URINALYSIS, ROUTINE W REFLEX MICROSCOPIC
Bilirubin Urine: NEGATIVE
GLUCOSE, UA: NEGATIVE mg/dL
HGB URINE DIPSTICK: NEGATIVE
Ketones, ur: NEGATIVE mg/dL
Leukocytes, UA: NEGATIVE
Nitrite: NEGATIVE
PROTEIN: NEGATIVE mg/dL
SPECIFIC GRAVITY, URINE: 1.02 (ref 1.005–1.030)
Urobilinogen, UA: 0.2 mg/dL (ref 0.0–1.0)
pH: 6.5 (ref 5.0–8.0)

## 2014-10-02 MED ORDER — NIFEDIPINE 10 MG PO CAPS: 10.0000 mg | ORAL_CAPSULE | Freq: Once | ORAL | Status: DC

## 2014-10-02 MED ORDER — NIFEDIPINE 10 MG PO CAPS
10.0000 mg | ORAL_CAPSULE | Freq: Once | ORAL | Status: AC
  Administered 2014-10-02: 10 mg via ORAL
  Filled 2014-10-02: qty 1

## 2014-10-02 MED ORDER — NIFEDIPINE ER OSMOTIC RELEASE 30 MG PO TB24: 30.0000 mg | ORAL_TABLET | Freq: Every day | ORAL | Status: DC | PRN

## 2014-10-02 NOTE — MAU Provider Note (Signed)
History     CSN: [id number]  Arrival date and time: 10/02/14 1244   First Provider Initiated Contact with Patient 10/02/14 1345      No chief complaint on file.   This is a 24 y.o. female at [redacted]w[redacted]d who presents with c/o pain in her cervix since last night. States it is a dull pain and radiates to lower abdomen. Denies bleeding. Also has some low back pain.    Abdominal Pain This is a new problem. The current episode started yesterday. The onset quality is gradual. The problem occurs intermittently. The problem has been unchanged. The pain is located in the LLQ, RLQ and suprapubic region. The pain is at a severity of 8/10. The pain is moderate. The quality of the pain is cramping and aching. The abdominal pain radiates to the back. Pertinent negatives include no constipation, diarrhea, dysuria, fever, frequency, nausea or vomiting. Nothing aggravates the pain. The pain is relieved by nothing. She has tried nothing for the symptoms.   RN Note: Pt says her cervix started hurting last night. No bleeding. No abdominal pain. Back hurts. Rates cervical pain 8of10. Has not had sex since May.           OB History    Gravida Para Term Preterm AB TAB SAB Ectopic Multiple Living   3 1 0 1 1 0 1 0 0 0       Past Medical History  Diagnosis Date  . Depression     depression  . Bipolar disorder   . ADHD (attention deficit hyperactivity disorder)   . Anxiety   . Anemia   . ADHD (attention deficit hyperactivity disorder)   . Bipolar 1 disorder   . Chlamydia 05-31-10  . Pseudocyesis 2013    Seen in MAU for percieved FM, abd distension. Normal exam.   . Gonorrhea contact, treated   . Pregnancy induced hypertension   . Headache   . Infection     UTI    Past Surgical History  Procedure Laterality Date  . Nasal septum surgery    . Cesarean section N/A 09/01/2012    Procedure:  Primary cesarean section with delivery of baby girl at 12.  Apgars 1/1.  ;  Surgeon: Frederico Hamman,  MD;  Location: [facility];  Service: Obstetrics;  Laterality: N/A;    Family History  Problem Relation Age of Onset  . Kidney disease Mother   . Hypertension Father   . Cancer Sister   . Asthma Brother   . Heart disease Neg Hx   . Diabetes Maternal Grandfather     History  Substance Use Topics  . Smoking status: Never Smoker   . Smokeless tobacco: Never Used  . Alcohol Use: No    Allergies: No Known Allergies  Prescriptions prior to admission  Medication Sig Dispense Refill Last Dose  . aspirin 81 MG tablet Take 81 mg by mouth daily.   10/02/2014 at Unknown time  . Prenat-FeFum-FePo-FA-Omega 3 (CONCEPT DHA) 53.5-38-1 MG CAPS Take 1 capsule by mouth daily. 30 capsule 11 10/02/2014 at Unknown time    Review of Systems  Constitutional: Negative for fever, chills and malaise/fatigue.  Gastrointestinal: Positive for abdominal pain. Negative for nausea, vomiting, diarrhea and constipation.  Genitourinary: Negative for dysuria, urgency and frequency.  Musculoskeletal: Positive for back pain.  Neurological: Negative for weakness.   Reports no complaints in other systems  Physical Exam   Blood pressure 135/72, pulse 103, temperature 98.2 F (36.8 C), temperature source Oral, resp.  rate 20, height 5\' 3"  (1.6 m), weight 135 lb (61.236 kg), last menstrual period 04/30/2014.  Physical Exam  Constitutional: She is oriented to person, place, and time. She appears well-developed and well-nourished. No distress.  HENT:  Head: Normocephalic.  Cardiovascular: Normal rate and regular rhythm.   Respiratory: Effort normal. No respiratory distress.  GI: Soft. She exhibits no distension and no mass. There is no tenderness. There is no rebound and no guarding.  Genitourinary: Vagina normal. No vaginal discharge found.  Dilation: Closed Effacement (%): Thick Exam by:: Hansel Feinstein, CNM   FHR 150s Uterine irritability noted on TOco  Musculoskeletal: Normal range of motion.  Neurological:  She is alert and oriented to person, place, and time.  Skin: Skin is warm and dry.  Psychiatric: She has a normal mood and affect.   Results for orders placed or performed during the hospital encounter of 10/02/14 (from the past 72 hour(s))  Urinalysis, Routine w reflex microscopic (not at [facility])     Status: Abnormal   Collection Time: 10/02/14  1:00 PM  Result Value Ref Range   Color, Urine YELLOW YELLOW   APPearance CLOUDY (A) CLEAR   Specific Gravity, Urine 1.020 1.005 - 1.030   pH 6.5 5.0 - 8.0   Glucose, UA NEGATIVE NEGATIVE mg/dL   Hgb urine dipstick NEGATIVE NEGATIVE   Bilirubin Urine NEGATIVE NEGATIVE   Ketones, ur NEGATIVE NEGATIVE mg/dL   Protein, ur NEGATIVE NEGATIVE mg/dL   Urobilinogen, UA 0.2 0.0 - 1.0 mg/dL   Nitrite NEGATIVE NEGATIVE   Leukocytes, UA NEGATIVE NEGATIVE    Comment: MICROSCOPIC NOT DONE ON URINES WITH NEGATIVE PROTEIN, BLOOD, LEUKOCYTES, NITRITE, OR GLUCOSE <1000 mg/dL.    MAU Course  Procedures  MDM Consulted Dr Jodi Mourning re: uterine irritability and report of pain, as well as closed cervix PRocardia ordered for tocolysis Contractions stopped after first dose, so second dose not given Plan Rx for Procardia XL for home use PRN  Assessment and Plan  A:  SIUP at [redacted]w[redacted]d       Preterm contractions and uterine irritability      No preterm dilation or effacement of cervix  P:  Re=consulted Dr Jodi Mourning with results of medication       Discharge home       PTL precautions       Rx Procardia XL for home use       Follow up in office for recheck of cervix  [facility] 10/02/2014, 1:58 PM

## 2014-10-02 NOTE — MAU Note (Signed)
Pt says her cervix started hurting last night. No bleeding. No abdominal pain. Back hurts. Rates cervical pain 8of10. Has not had sex since May.

## 2014-10-02 NOTE — Discharge Instructions (Signed)

## 2014-10-05 ENCOUNTER — Inpatient Hospital Stay (HOSPITAL_COMMUNITY)
Admission: AD | Admit: 2014-10-05 | Discharge: 2014-10-05 | Disposition: A | Discharge status: Home / Self Care | Payer: Medicaid Other | Source: Ambulatory Visit | Attending: Obstetrics | Admitting: Obstetrics

## 2014-10-05 ENCOUNTER — Inpatient Hospital Stay (HOSPITAL_COMMUNITY): Payer: Self-pay | Admitting: *Deleted

## 2014-10-05 DIAGNOSIS — Z7982 Long term (current) use of aspirin: Secondary | ICD-10-CM | POA: Insufficient documentation

## 2014-10-05 DIAGNOSIS — O4702 False labor before 37 completed weeks of gestation, second trimester: Secondary | ICD-10-CM

## 2014-10-05 DIAGNOSIS — R109 Unspecified abdominal pain: Secondary | ICD-10-CM | POA: Diagnosis present

## 2014-10-05 DIAGNOSIS — Z3A22 22 weeks gestation of pregnancy: Secondary | ICD-10-CM | POA: Diagnosis not present

## 2014-10-05 LAB — URINALYSIS, ROUTINE W REFLEX MICROSCOPIC
Bilirubin Urine: NEGATIVE
Glucose, UA: NEGATIVE mg/dL
HGB URINE DIPSTICK: NEGATIVE
Ketones, ur: NEGATIVE mg/dL
Nitrite: NEGATIVE
Protein, ur: NEGATIVE mg/dL
SPECIFIC GRAVITY, URINE: 1.015 (ref 1.005–1.030)
UROBILINOGEN UA: 0.2 mg/dL (ref 0.0–1.0)
pH: 6.5 (ref 5.0–8.0)

## 2014-10-05 LAB — WET PREP, GENITAL
CLUE CELLS WET PREP: NONE SEEN
Trich, Wet Prep: NONE SEEN
Yeast Wet Prep HPF POC: NONE SEEN

## 2014-10-05 LAB — URINE MICROSCOPIC-ADD ON

## 2014-10-05 MED ORDER — IBUPROFEN 600 MG PO TABS
600.0000 mg | ORAL_TABLET | Freq: Once | ORAL | Status: AC
  Administered 2014-10-05: 600 mg via ORAL
  Filled 2014-10-05: qty 1

## 2014-10-05 MED ORDER — NIFEDIPINE 10 MG PO CAPS
10.0000 mg | ORAL_CAPSULE | ORAL | Status: DC | PRN
  Administered 2014-10-05: 10 mg via ORAL
  Filled 2014-10-05: qty 1

## 2014-10-05 NOTE — Discharge Instructions (Signed)
Abdominal Pain During Pregnancy Abdominal pain is common in pregnancy. Most of the time, it does not cause harm. There are many causes of abdominal pain. Some causes are more serious than others. Some of the causes of abdominal pain in pregnancy are easily diagnosed. Occasionally, the diagnosis takes time to understand. Other times, the cause is not determined. Abdominal pain can be a sign that something is very wrong with the pregnancy, or the pain may have nothing to do with the pregnancy at all. For this reason, always tell your health care provider if you have any abdominal discomfort. HOME CARE INSTRUCTIONS  Monitor your abdominal pain for any changes. The following actions may help to alleviate any discomfort you are experiencing:  Do not have sexual intercourse or put anything in your vagina until your symptoms go away completely.  Get plenty of rest until your pain improves.  Drink clear fluids if you feel nauseous. Avoid solid food as long as you are uncomfortable or nauseous.  Only take over-the-counter or prescription medicine as directed by your health care provider.  Keep all follow-up appointments with your health care provider. SEEK IMMEDIATE MEDICAL CARE IF:  You are bleeding, leaking fluid, or passing tissue from the vagina.  You have increasing pain or cramping.  You have persistent vomiting.  You have painful or bloody urination.  You have a fever.  You notice a decrease in your baby's movements.  You have extreme weakness or feel faint.  You have shortness of breath, with or without abdominal pain.  You develop a severe headache with abdominal pain.  You have abnormal vaginal discharge with abdominal pain.  You have persistent diarrhea.  You have abdominal pain that continues even after rest, or gets worse. MAKE SURE YOU:   Understand these instructions.  Will watch your condition.  Will get help right away if you are not doing well or get  worse. Document Released: 03/21/2005 Document Revised: 01/09/2013 Document Reviewed: 10/18/2012 [facility] Patient Information 2015 [place], [place]. This information is not intended to replace advice given to you by your health care provider. Make sure you discuss any questions you have with your health care provider.   Preterm Labor Information Preterm labor is when labor starts at less than 37 weeks of pregnancy. The normal length of a pregnancy is 39 to 41 weeks. CAUSES Often, there is no identifiable underlying cause as to why a woman goes into preterm labor. One of the most common known causes of preterm labor is infection. Infections of the uterus, cervix, vagina, amniotic sac, bladder, kidney, or even the lungs (pneumonia) can cause labor to start. Other suspected causes of preterm labor include:   Urogenital infections, such as yeast infections and bacterial vaginosis.   Uterine abnormalities (uterine shape, uterine septum, fibroids, or bleeding from the placenta).   A cervix that has been operated on (it may fail to stay closed).   Malformations in the fetus.   Multiple gestations (twins, triplets, and so on).   Breakage of the amniotic sac.  RISK FACTORS  Having a previous history of preterm labor.   Having premature rupture of membranes (PROM).   Having a placenta that covers the opening of the cervix (placenta previa).   Having a placenta that separates from the uterus (placental abruption).   Having a cervix that is too weak to hold the fetus in the uterus (incompetent cervix).   Having too much fluid in the amniotic sac (polyhydramnios).   Taking illegal drugs or smoking  while pregnant.   Not gaining enough weight while pregnant.   Being younger than 55 and older than 22 years old.   Having a low socioeconomic status.   Being African American. SYMPTOMS Signs and symptoms of preterm labor include:   Menstrual-like cramps, abdominal pain, or  back pain.  Uterine contractions that are regular, as frequent as six in an hour, regardless of their intensity (may be mild or painful).  Contractions that start on the top of the uterus and spread down to the lower abdomen and back.   A sense of increased pelvic pressure.   A watery or bloody mucus discharge that comes from the vagina.  TREATMENT Depending on the length of the pregnancy and other circumstances, your health care provider may suggest bed rest. If necessary, there are medicines that can be given to stop contractions and to mature the fetal lungs. If labor happens before 34 weeks of pregnancy, a prolonged hospital stay may be recommended. Treatment depends on the condition of both you and the fetus.  WHAT SHOULD YOU DO IF YOU THINK YOU ARE IN PRETERM LABOR? Call your health care provider right away. You will need to go to the hospital to get checked immediately. HOW CAN YOU PREVENT PRETERM LABOR IN FUTURE PREGNANCIES? You should:   Stop smoking if you smoke.  Maintain healthy weight gain and avoid chemicals and drugs that are not necessary.  Be watchful for any type of infection.  Inform your health care provider if you have a known history of preterm labor. Document Released: 06/11/2003 Document Revised: 11/21/2012 Document Reviewed: 04/23/2012 [facility] Patient Information 2015 [place], [place]. This information is not intended to replace advice given to you by your health care provider. Make sure you discuss any questions you have with your health care provider.

## 2014-10-05 NOTE — MAU Note (Signed)
Patient presents at [redacted] weeks gestation with c/o abdominal cramping since 0700 today. Fetus active. Denies bleeding or discharge.

## 2014-10-05 NOTE — MAU Provider Note (Signed)
Chief Complaint:  Abdominal Pain  First Provider Initiated Contact with Patient 10/05/14 1145      HPI: Stephanie Mack is a 24 y.o. G3P0110 at [redacted]w[redacted]d who presents to maternity admissions reporting low abd cramping since 0700. Was seen in MAU 10/02/14 for same. UI noted of toco. Cervix closed and long. Rx Procardia. Pt did NOT take procardia this morning after cramping started. Unsure if she is having contractions. Denies  leakage of fluid or vaginal bleeding. Good fetal movement.   History of classical cesarean at 25 weeks and 3 days gestation in 2014 for severe preeclampsia. Neonatal death. States she is anxious about approaching that gestational age. Chlamydia positive 08/12/2014. GC/Chlamydia, wet prep negative on 09/27/2014. Seen in MAU 6/15, 6/17, 6/30 for a variety of concerns. Has not called office.   Past Medical History: Past Medical History  Diagnosis Date  . Depression     depression  . Bipolar disorder   . ADHD (attention deficit hyperactivity disorder)   . Anxiety   . Anemia   . ADHD (attention deficit hyperactivity disorder)   . Bipolar 1 disorder   . Chlamydia 05-31-10  . Pseudocyesis 2013    Seen in MAU for percieved FM, abd distension. Normal exam.   . Gonorrhea contact, treated   . Pregnancy induced hypertension   . Headache   . Infection     UTI    Past obstetric history: OB History  Gravida Para Term Preterm AB SAB TAB Ectopic Multiple Living  3 1 0 1 1 1 0 0 0 0     # Outcome Date GA Lbr Len/2nd Weight Sex Delivery Anes PTL Lv  3 Current           2 Preterm 09/01/12 [redacted]w[redacted]d   F CS-LTranv Spinal  N  1 SAB 09/16/09 [redacted]w[redacted]d            Comments: Twin gestational sacs.       Past Surgical History: Past Surgical History  Procedure Laterality Date  . Nasal septum surgery    . Cesarean section N/A 09/01/2012    Procedure:  Primary cesarean section with delivery of baby girl at 22.  Apgars 1/1.  ;  Surgeon: Frederico Hamman, MD;  Location: [facility];  Service:  Obstetrics;  Laterality: N/A;     Family History: Family History  Problem Relation Age of Onset  . Kidney disease Mother   . Hypertension Father   . Cancer Sister   . Asthma Brother   . Heart disease Neg Hx   . Diabetes Maternal Grandfather     Social History: History  Substance Use Topics  . Smoking status: Never Smoker   . Smokeless tobacco: Never Used  . Alcohol Use: No    Allergies: No Known Allergies  Meds:  Prescriptions prior to admission  Medication Sig Dispense Refill Last Dose  . aspirin 81 MG tablet Take 81 mg by mouth daily.   10/04/2014 at Unknown time  . NIFEdipine (PROCARDIA-XL/ADALAT-CC/NIFEDICAL-XL) 30 MG 24 hr tablet Take 1 tablet (30 mg total) by mouth daily as needed. 30 tablet 0 10/04/2014 at Unknown time  . Prenat-FeFum-FePo-FA-Omega 3 (CONCEPT DHA) 53.5-38-1 MG CAPS Take 1 capsule by mouth daily. 30 capsule 11 10/04/2014 at Unknown time    ROS:  Review of Systems  Constitutional: Negative for fever and chills.  Gastrointestinal: Positive for abdominal pain. Negative for nausea, vomiting, diarrhea and constipation.  Genitourinary: Negative for dysuria, urgency, frequency, hematuria and flank pain.  Negative for vaginal bleeding, vaginal discharge or leaking of fluid.  Musculoskeletal: Negative for back pain.    Physical Exam  Blood pressure 120/68, pulse 84, temperature 98.9 F (37.2 C), temperature source Oral, resp. rate 16, height 5\' 3"  (1.6 m), weight 135 lb (61.236 kg), last menstrual period 04/30/2014. GENERAL: Well-developed, well-nourished female in no acute distress. Anxious. HEART: normal rate RESP: normal effort GI: Abd soft, non-tender, gravid appropriate for gestational age. Pos BS x 4 MS: Extremities nontender, no edema, normal ROM NEURO: Alert and oriented x 4.  GU: NEFG, physiologic discharge, no blood, cervix clean. No CVAT   Cervix closed and long, firm  FHT:  Baseline 155 , moderate variability, accelerations present, no  decelerations Contractions: 1 contraction   Labs: Results for orders placed or performed during the hospital encounter of 10/05/14 (from the past 24 hour(s))  Urinalysis, Routine w reflex microscopic (not at [facility])     Status: Abnormal   Collection Time: 10/05/14 10:45 AM  Result Value Ref Range   Color, Urine YELLOW YELLOW   APPearance CLEAR CLEAR   Specific Gravity, Urine 1.015 1.005 - 1.030   pH 6.5 5.0 - 8.0   Glucose, UA NEGATIVE NEGATIVE mg/dL   Hgb urine dipstick NEGATIVE NEGATIVE   Bilirubin Urine NEGATIVE NEGATIVE   Ketones, ur NEGATIVE NEGATIVE mg/dL   Protein, ur NEGATIVE NEGATIVE mg/dL   Urobilinogen, UA 0.2 0.0 - 1.0 mg/dL   Nitrite NEGATIVE NEGATIVE   Leukocytes, UA SMALL (A) NEGATIVE  Urine microscopic-add on     Status: Abnormal   Collection Time: 10/05/14 10:45 AM  Result Value Ref Range   Squamous Epithelial / LPF MANY (A) RARE   WBC, UA 3-6 <3 WBC/hpf   Bacteria, UA RARE RARE  Wet prep, genital     Status: Abnormal   Collection Time: 10/05/14 11:56 AM  Result Value Ref Range   Yeast Wet Prep HPF POC NONE SEEN NONE SEEN   Trich, Wet Prep NONE SEEN NONE SEEN   Clue Cells Wet Prep HPF POC NONE SEEN NONE SEEN   WBC, Wet Prep HPF POC FEW (A) NONE SEEN    Imaging:  NA  MAU Course: Pelvic exam UA, wet prep, GC/chlamydia cultures, Procardia, ibuprofen, warm compress.  Pain much better. Now 4/10 on pain scale. No sign of active preterm labor. Per patient and her grandmother patient is extremely anxious about history of fetal death and miscarriage. Discussed that there is no evidence of preeclampsia or preterm labor at this point and encouraged her to call the office will to discuss nonemergent concerns in the pregnancy. Encouraged her to discuss anxiety about her poor obstetric history when she does see her provider. May need counseling.   Assessment: 1. Preterm contractions, second trimester     Plan: Discharge home in stable condition.  Preterm precautions  and fetal kick counts     Follow-up Information    Follow up with [facility] On 10/07/2014.   Why:  As scheduled or sooner as needed if symptoms worsen   Contact information:   [address] [place] [address] [phone]      Follow up with [facility].   Why:  As needed in emergencies   Contact information:   [address] [id number] [place] [phone]         Medication List    TAKE these medications        aspirin  81 MG tablet  Take 81 mg by mouth daily.     CONCEPT DHA 53.5-38-1 MG Caps  Take 1 capsule by mouth daily.     NIFEdipine 30 MG 24 hr tablet  Commonly known as:  PROCARDIA-XL/ADALAT-CC/NIFEDICAL-XL  Take 1 tablet (30 mg total) by mouth daily as needed.       [address], CNM 10/05/2014 1:35 PM

## 2014-10-07 ENCOUNTER — Encounter: Payer: Self-pay | Admitting: Obstetrics

## 2014-10-07 ENCOUNTER — Ambulatory Visit (INDEPENDENT_AMBULATORY_CARE_PROVIDER_SITE_OTHER): Payer: Medicaid Other | Admitting: Obstetrics

## 2014-10-07 ENCOUNTER — Other Ambulatory Visit: Payer: Self-pay | Admitting: Certified Nurse Midwife

## 2014-10-07 VITALS — BP 125/79 | HR 98 | Temp 98.4°F | Wt 133.0 lb

## 2014-10-07 DIAGNOSIS — O0992 Supervision of high risk pregnancy, unspecified, second trimester: Secondary | ICD-10-CM

## 2014-10-07 LAB — POCT URINALYSIS DIPSTICK
BILIRUBIN UA: NEGATIVE
Glucose, UA: NEGATIVE
KETONES UA: NEGATIVE
Leukocytes, UA: NEGATIVE
NITRITE UA: NEGATIVE
Spec Grav, UA: 1.02
Urobilinogen, UA: NEGATIVE
pH, UA: 6
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
## 2014-10-09 ENCOUNTER — Ambulatory Visit (INDEPENDENT_AMBULATORY_CARE_PROVIDER_SITE_OTHER): Payer: Medicaid Other | Admitting: Certified Nurse Midwife

## 2014-10-09 VITALS — BP 111/71 | HR 94 | Temp 97.9°F | Wt 132.0 lb

## 2014-10-09 DIAGNOSIS — O0992 Supervision of high risk pregnancy, unspecified, second trimester: Secondary | ICD-10-CM

## 2014-10-09 LAB — POCT URINALYSIS DIPSTICK
Bilirubin, UA: NEGATIVE
Blood, UA: NEGATIVE
GLUCOSE UA: NEGATIVE
Leukocytes, UA: NEGATIVE
Nitrite, UA: NEGATIVE
SPEC GRAV UA: 1.015
Urobilinogen, UA: NEGATIVE
pH, UA: 6.5
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
## 2014-10-12 ENCOUNTER — Inpatient Hospital Stay (HOSPITAL_COMMUNITY)
Admission: AD | Admit: 2014-10-12 | Discharge: 2014-10-12 | Disposition: A | Discharge status: Home / Self Care | Payer: Medicaid Other | Source: Ambulatory Visit | Attending: Obstetrics | Admitting: Obstetrics

## 2014-10-12 ENCOUNTER — Encounter (HOSPITAL_COMMUNITY): Payer: Self-pay

## 2014-10-12 DIAGNOSIS — R102 Pelvic and perineal pain: Secondary | ICD-10-CM | POA: Diagnosis not present

## 2014-10-12 DIAGNOSIS — O9989 Other specified diseases and conditions complicating pregnancy, childbirth and the puerperium: Secondary | ICD-10-CM | POA: Diagnosis not present

## 2014-10-12 DIAGNOSIS — N949 Unspecified condition associated with female genital organs and menstrual cycle: Secondary | ICD-10-CM | POA: Diagnosis not present

## 2014-10-12 DIAGNOSIS — Z3A23 23 weeks gestation of pregnancy: Secondary | ICD-10-CM | POA: Insufficient documentation

## 2014-10-12 DIAGNOSIS — R109 Unspecified abdominal pain: Secondary | ICD-10-CM | POA: Diagnosis present

## 2014-10-12 LAB — URINALYSIS, ROUTINE W REFLEX MICROSCOPIC
BILIRUBIN URINE: NEGATIVE
Glucose, UA: NEGATIVE mg/dL
Hgb urine dipstick: NEGATIVE
KETONES UR: NEGATIVE mg/dL
LEUKOCYTES UA: NEGATIVE
NITRITE: NEGATIVE
Protein, ur: NEGATIVE mg/dL
SPECIFIC GRAVITY, URINE: 1.02 (ref 1.005–1.030)
UROBILINOGEN UA: 0.2 mg/dL (ref 0.0–1.0)
pH: 6 (ref 5.0–8.0)

## 2014-10-12 NOTE — MAU Note (Signed)
Pt presents complaining of sharp shooting lower abdominal pain that is worse with movement. Denies vaginal bleeding or discharge. Reports good fetal movement.

## 2014-10-12 NOTE — MAU Provider Note (Signed)
History     CSN: [id number]  Arrival date and time: 10/12/14 1432   First Provider Initiated Contact with Patient 10/12/14 1504      Chief Complaint  Patient presents with  . Abdominal Pain   HPI  Stephanie Mack 24 y.o. [id number] @[redacted]w[redacted]d  presents to the MAU stating that she had some pain on her left side radiating to her groin last night and it is worse when she walks. Denies vaginal bleeding, LOF. Reports positive fetal movement  Past Medical History  Diagnosis Date  . Depression     depression  . Bipolar disorder   . ADHD (attention deficit hyperactivity disorder)   . Anxiety   . Anemia   . ADHD (attention deficit hyperactivity disorder)   . Bipolar 1 disorder   . Chlamydia 05-31-10  . Pseudocyesis 2013    Seen in MAU for percieved FM, abd distension. Normal exam.   . Gonorrhea contact, treated   . Pregnancy induced hypertension   . Headache   . Infection     UTI    Past Surgical History  Procedure Laterality Date  . Nasal septum surgery    . Cesarean section N/A 09/01/2012    Procedure:  Primary cesarean section with delivery of baby girl at 4.  Apgars 1/1.  ;  Surgeon: Frederico Hamman, MD;  Location: [facility];  Service: Obstetrics;  Laterality: N/A;    Family History  Problem Relation Age of Onset  . Kidney disease Mother   . Hypertension Father   . Cancer Sister   . Asthma Brother   . Heart disease Neg Hx   . Diabetes Maternal Grandfather     History  Substance Use Topics  . Smoking status: Never Smoker   . Smokeless tobacco: Never Used  . Alcohol Use: No    Allergies: No Known Allergies  Prescriptions prior to admission  Medication Sig Dispense Refill Last Dose  . aspirin 81 MG tablet Take 81 mg by mouth daily.   10/12/2014 at Unknown time  . NIFEdipine (PROCARDIA-XL/ADALAT-CC/NIFEDICAL-XL) 30 MG 24 hr tablet Take 1 tablet (30 mg total) by mouth daily as needed. 30 tablet 0 Past Week at Unknown time  . Prenat-FeFum-FePo-FA-Omega 3 (CONCEPT DHA)  53.5-38-1 MG CAPS Take 1 capsule by mouth daily. 30 capsule 11 10/11/2014 at Unknown time    Review of Systems  Constitutional: Negative for fever.  Gastrointestinal: Positive for abdominal pain.  All other systems reviewed and are negative.  Physical Exam   Blood pressure 118/73, pulse 96, temperature 98 F (36.7 C), resp. rate 18, last menstrual period 04/30/2014.  Physical Exam  Nursing note and vitals reviewed. Constitutional: She is oriented to person, place, and time. She appears well-developed and well-nourished. No distress.  HENT:  Head: Normocephalic.  Neck: Normal range of motion.  Cardiovascular: Normal rate.   Respiratory: Effort normal. No respiratory distress.  GI: Soft. There is no tenderness.  Genitourinary: Vagina normal.  Musculoskeletal: Normal range of motion.  Neurological: She is alert and oriented to person, place, and time.  Skin: Skin is warm and dry.  Psychiatric: She has a normal mood and affect. Her behavior is normal. Judgment and thought content normal.    MAU Course  Procedures Results for orders placed or performed during the hospital encounter of 10/12/14 (from the past 24 hour(s))  Urinalysis, Routine w reflex microscopic (not at [facility])     Status: None   Collection Time: 10/12/14  2:44 PM  Result Value Ref Range  Color, Urine YELLOW YELLOW   APPearance CLEAR CLEAR   Specific Gravity, Urine 1.020 1.005 - 1.030   pH 6.0 5.0 - 8.0   Glucose, UA NEGATIVE NEGATIVE mg/dL   Hgb urine dipstick NEGATIVE NEGATIVE   Bilirubin Urine NEGATIVE NEGATIVE   Ketones, ur NEGATIVE NEGATIVE mg/dL   Protein, ur NEGATIVE NEGATIVE mg/dL   Urobilinogen, UA 0.2 0.0 - 1.0 mg/dL   Nitrite NEGATIVE NEGATIVE   Leukocytes, UA NEGATIVE NEGATIVE   MDM EFM FHT's Cat 1 no contractions  Assessment and Plan  Round ligament Pain   Discharge to home  [facility] 10/12/2014, 3:25 PM

## 2014-10-12 NOTE — Discharge Instructions (Signed)

## 2014-10-13 ENCOUNTER — Ambulatory Visit (HOSPITAL_COMMUNITY)
Admission: RE | Admit: 2014-10-13 | Discharge: 2014-10-13 | Disposition: A | Discharge status: Home / Self Care | Payer: Medicaid Other | Source: Ambulatory Visit | Attending: Certified Nurse Midwife | Admitting: Certified Nurse Midwife

## 2014-10-13 ENCOUNTER — Other Ambulatory Visit: Payer: Self-pay | Admitting: Certified Nurse Midwife

## 2014-10-13 ENCOUNTER — Encounter (HOSPITAL_COMMUNITY): Payer: Self-pay

## 2014-10-13 ENCOUNTER — Ambulatory Visit (HOSPITAL_COMMUNITY): Admission: RE | Admit: 2014-10-13 | Payer: Medicaid Other | Source: Ambulatory Visit

## 2014-10-13 ENCOUNTER — Inpatient Hospital Stay (HOSPITAL_COMMUNITY)
Admission: AD | Admit: 2014-10-13 | Discharge: 2014-10-13 | Disposition: A | Discharge status: Home / Self Care | Payer: Medicaid Other | Source: Ambulatory Visit | Attending: Obstetrics | Admitting: Obstetrics

## 2014-10-13 DIAGNOSIS — O9989 Other specified diseases and conditions complicating pregnancy, childbirth and the puerperium: Secondary | ICD-10-CM | POA: Diagnosis not present

## 2014-10-13 DIAGNOSIS — Z3A23 23 weeks gestation of pregnancy: Secondary | ICD-10-CM | POA: Diagnosis not present

## 2014-10-13 DIAGNOSIS — O99012 Anemia complicating pregnancy, second trimester: Secondary | ICD-10-CM | POA: Insufficient documentation

## 2014-10-13 DIAGNOSIS — O0992 Supervision of high risk pregnancy, unspecified, second trimester: Secondary | ICD-10-CM | POA: Insufficient documentation

## 2014-10-13 DIAGNOSIS — R42 Dizziness and giddiness: Secondary | ICD-10-CM | POA: Insufficient documentation

## 2014-10-13 DIAGNOSIS — D649 Anemia, unspecified: Secondary | ICD-10-CM | POA: Diagnosis not present

## 2014-10-13 DIAGNOSIS — N949 Unspecified condition associated with female genital organs and menstrual cycle: Secondary | ICD-10-CM | POA: Diagnosis not present

## 2014-10-13 LAB — CBC
HEMATOCRIT: 34.3 % — AB (ref 36.0–46.0)
Hemoglobin: 10.9 g/dL — ABNORMAL LOW (ref 12.0–15.0)
MCH: 25.5 pg — AB (ref 26.0–34.0)
MCHC: 31.8 g/dL (ref 30.0–36.0)
MCV: 80.3 fL (ref 78.0–100.0)
PLATELETS: 197 10*3/uL (ref 150–400)
RBC: 4.27 MIL/uL (ref 3.87–5.11)
RDW: 14.6 % (ref 11.5–15.5)
WBC: 8.4 10*3/uL (ref 4.0–10.5)

## 2014-10-13 LAB — URINALYSIS, ROUTINE W REFLEX MICROSCOPIC
BILIRUBIN URINE: NEGATIVE
GLUCOSE, UA: NEGATIVE mg/dL
HGB URINE DIPSTICK: NEGATIVE
Ketones, ur: NEGATIVE mg/dL
Leukocytes, UA: NEGATIVE
Nitrite: NEGATIVE
PH: 7 (ref 5.0–8.0)
Protein, ur: NEGATIVE mg/dL
Specific Gravity, Urine: 1.015 (ref 1.005–1.030)
UROBILINOGEN UA: 0.2 mg/dL (ref 0.0–1.0)

## 2014-10-13 LAB — GLUCOSE, CAPILLARY: Glucose-Capillary: 76 mg/dL (ref 65–99)

## 2014-10-13 NOTE — Discharge Instructions (Signed)

## 2014-10-13 NOTE — MAU Note (Signed)
Pt presents to MAU with complaints of feeling lightheaded and passing out earlier in the bathroom at home. Her grandmother states that she went to the bathroom and found her in the floor laying on her side. Pt denies any vaginal bleeding or LOF

## 2014-10-13 NOTE — MAU Provider Note (Signed)
History     CSN: [id number]  Arrival date and time: 10/13/14 1630   First Provider Initiated Contact with Patient 10/13/14 1720      No chief complaint on file.  HPI   [name] is a 24 y.o. female G3P0110 at [redacted]w[redacted]d presenting to MAU with dizziness. The patient has multiple MAU visits with this pregnancy. She has a poor obstetrical history including a 25 week fetal demise.  She brought here today by her grandmother who found her on the bathroom floor complaining that she was very hot. She was trying to use the bathroom and when she sat down on the toilet she started feeling dizzy. She then laid down on the floor, however did not feel better when she laid down.   The patient never lost consciousness. She never passed out. She felt weak and dizzy and laid down on the floor.   She skipped breakfast today and had macaroni, greens and chicken at 12 noon.   OB History    Gravida Para Term Preterm AB TAB SAB Ectopic Multiple Living   3 1 0 1 1 0 1 0 0 0       Past Medical History  Diagnosis Date  . Depression     depression  . Bipolar disorder   . ADHD (attention deficit hyperactivity disorder)   . Anxiety   . Anemia   . ADHD (attention deficit hyperactivity disorder)   . Bipolar 1 disorder   . Chlamydia 05-31-10  . Pseudocyesis 2013    Seen in MAU for percieved FM, abd distension. Normal exam.   . Gonorrhea contact, treated   . Pregnancy induced hypertension   . Headache   . Infection     UTI    Past Surgical History  Procedure Laterality Date  . Nasal septum surgery    . Cesarean section N/A 09/01/2012    Procedure:  Primary cesarean section with delivery of baby girl at 58.  Apgars 1/1.  ;  Surgeon: Frederico Hamman, MD;  Location: [facility];  Service: Obstetrics;  Laterality: N/A;    Family History  Problem Relation Age of Onset  . Kidney disease Mother   . Hypertension Father   . Cancer Sister   . Asthma Brother   . Heart disease Neg Hx   . Diabetes  Maternal Grandfather     History  Substance Use Topics  . Smoking status: Never Smoker   . Smokeless tobacco: Never Used  . Alcohol Use: No    Allergies: No Known Allergies  Prescriptions prior to admission  Medication Sig Dispense Refill Last Dose  . acetaminophen (TYLENOL) 325 MG tablet Take 325 mg by mouth every 6 (six) hours as needed for headache.   Past Month at Unknown time  . aspirin 81 MG tablet Take 81 mg by mouth daily.   10/13/2014 at 1200  . NIFEdipine (PROCARDIA-XL/ADALAT-CC/NIFEDICAL-XL) 30 MG 24 hr tablet Take 1 tablet (30 mg total) by mouth daily as needed. (Patient taking differently: Take 30 mg by mouth 2 (two) times daily. ) 30 tablet 0 10/12/2014 at Unknown time  . Prenat-FeFum-FePo-FA-Omega 3 (CONCEPT DHA) 53.5-38-1 MG CAPS Take 1 capsule by mouth daily. 30 capsule 11 10/13/2014 at 1200   Results for orders placed or performed during the hospital encounter of 10/13/14 (from the past 48 hour(s))  Urinalysis, Routine w reflex microscopic (not at [facility])     Status: Abnormal   Collection Time: 10/13/14  4:50 PM  Result Value Ref Range  Color, Urine YELLOW YELLOW   APPearance HAZY (A) CLEAR   Specific Gravity, Urine 1.015 1.005 - 1.030   pH 7.0 5.0 - 8.0   Glucose, UA NEGATIVE NEGATIVE mg/dL   Hgb urine dipstick NEGATIVE NEGATIVE   Bilirubin Urine NEGATIVE NEGATIVE   Ketones, ur NEGATIVE NEGATIVE mg/dL   Protein, ur NEGATIVE NEGATIVE mg/dL   Urobilinogen, UA 0.2 0.0 - 1.0 mg/dL   Nitrite NEGATIVE NEGATIVE   Leukocytes, UA NEGATIVE NEGATIVE    Comment: MICROSCOPIC NOT DONE ON URINES WITH NEGATIVE PROTEIN, BLOOD, LEUKOCYTES, NITRITE, OR GLUCOSE <1000 mg/dL.  Glucose, capillary     Status: None   Collection Time: 10/13/14  4:59 PM  Result Value Ref Range   Glucose-Capillary 76 65 - 99 mg/dL  CBC     Status: Abnormal   Collection Time: 10/13/14  5:35 PM  Result Value Ref Range   WBC 8.4 4.0 - 10.5 K/uL   RBC 4.27 3.87 - 5.11 MIL/uL   Hemoglobin 10.9 (L) 12.0 -  15.0 g/dL   HCT 34.3 (L) 36.0 - 46.0 %   MCV 80.3 78.0 - 100.0 fL   MCH 25.5 (L) 26.0 - 34.0 pg   MCHC 31.8 30.0 - 36.0 g/dL   RDW 14.6 11.5 - 15.5 %   Platelets 197 150 - 400 K/uL    Review of Systems  Constitutional: Positive for chills. Negative for fever.  Eyes: Negative for blurred vision.  Gastrointestinal: Negative for nausea, vomiting, diarrhea and constipation.  Neurological: Positive for dizziness (Mild) and headaches.   Physical Exam   Blood pressure 122/80, pulse 101, temperature 98.2 F (36.8 C), resp. rate 18, last menstrual period 04/30/2014.  Physical Exam  Constitutional: She is oriented to person, place, and time. She appears well-developed and well-nourished. No distress.  HENT:  Head: Normocephalic.  Eyes: Pupils are equal, round, and reactive to light.  Neck: Neck supple.  Cardiovascular: Normal rate and normal heart sounds.   Respiratory: Effort normal and breath sounds normal. No respiratory distress.  GI: Soft. She exhibits no distension. There is no tenderness.  Musculoskeletal: Normal range of motion.  Neurological: She is alert and oriented to person, place, and time.  Skin: Skin is warm. She is not diaphoretic.  Psychiatric: Her behavior is normal.    Fetal Tracing: Baseline: 155 bpm  Variability: moderate  Accelerations: 10x10 Decelerations: none Toco: quiet   MAU Course  Procedures  None  MDM Blood sugar WNL Ortho static vitals normal   Assessment and Plan   A:  1. Dizziness   2. Anemia affecting pregnancy in second trimester    P:  Discharge home in stable condition Iron pills over the counter as directed on the bottle Follow up with Dr. Jodi Mourning as directed  Return to MAU for emergencies. Discussed the use of MAU.  Small, frequent meals Increase PO fluid intake   Lezlie Lye, NP 10/13/2014 5:27 PM

## 2014-10-13 NOTE — Progress Notes (Signed)
Stephanie Mack  was seen today for an ultrasound appointment.  See full report in [facility].  Impression: Single IUP at 23w 5d Hx of severe preeclampsia/ IUGR s/p delivery at 25 weeks and subsequent neonatal demise Interval growth is appropriate (63rd %tile) Normal amniotic fluid volume  Recommandations: See my previous consult from 07/29/2014 Recommend continued serial growth scans every 4 weeks, continue baby ASA as written Frequent clinic visits and blood pressure monitoring given patient's history Antenatal testing beginning at 32 weeks (2x weekly NSTs with weekly AFIs) Recommend repeat C-section at 36 weeks due to history of previous classical C-section  Benjaman Lobe, MD

## 2014-10-14 ENCOUNTER — Telehealth: Payer: Self-pay | Admitting: *Deleted

## 2014-10-14 NOTE — Telephone Encounter (Signed)
Patient wants to know what is the best iron to take.  3:30 call to patient - we have a new prenatal with iron in it- we will give her samples and call it to her pharmacy.

## 2014-10-15 ENCOUNTER — Encounter: Payer: Self-pay | Admitting: Obstetrics

## 2014-10-15 ENCOUNTER — Telehealth: Payer: Self-pay | Admitting: *Deleted

## 2014-10-15 ENCOUNTER — Ambulatory Visit (INDEPENDENT_AMBULATORY_CARE_PROVIDER_SITE_OTHER): Payer: Medicaid Other | Admitting: Obstetrics

## 2014-10-15 VITALS — BP 126/74 | HR 89 | Temp 98.1°F | Wt 134.0 lb

## 2014-10-15 DIAGNOSIS — O09213 Supervision of pregnancy with history of pre-term labor, third trimester: Secondary | ICD-10-CM | POA: Diagnosis not present

## 2014-10-15 DIAGNOSIS — Z3402 Encounter for supervision of normal first pregnancy, second trimester: Secondary | ICD-10-CM

## 2014-10-15 LAB — POCT URINALYSIS DIPSTICK
Bilirubin, UA: NEGATIVE
Blood, UA: NEGATIVE
GLUCOSE UA: NEGATIVE
Ketones, UA: NEGATIVE
Leukocytes, UA: NEGATIVE
Nitrite, UA: NEGATIVE
Protein, UA: NEGATIVE
Spec Grav, UA: 1.015
Urobilinogen, UA: NEGATIVE
pH, UA: 7

## 2014-10-15 NOTE — Telephone Encounter (Signed)
Pain killing her- pain in the top of her uterus 11:20 Call to patient - discussed symptoms- talked about GERD and gas pains. Reviewed her ultrasound and the current recommendations. Patient is doing as instructed- ASA daily and resting. Patient still having small contractions even with the Procardia. Patient is reassured that her cervix is stable. Told patient she could come into the office anytime she wanted to be monitored- she just needs to call and ask. Patient is ok with that and will do that if needed. Reviewed symptoms of problem- strong painful contractions, change in  Discharge, pain in lower pelvic region.
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
## 2014-10-17 ENCOUNTER — Ambulatory Visit (INDEPENDENT_AMBULATORY_CARE_PROVIDER_SITE_OTHER): Payer: Medicaid Other | Admitting: Certified Nurse Midwife

## 2014-10-17 VITALS — BP 124/79 | HR 88 | Temp 98.9°F | Wt 133.0 lb

## 2014-10-17 DIAGNOSIS — O0992 Supervision of high risk pregnancy, unspecified, second trimester: Secondary | ICD-10-CM
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
## 2014-10-20 ENCOUNTER — Telehealth: Payer: Self-pay | Admitting: *Deleted

## 2014-10-20 LAB — POCT URINALYSIS DIPSTICK
Bilirubin, UA: NEGATIVE
GLUCOSE UA: NEGATIVE
Ketones, UA: NEGATIVE
Leukocytes, UA: NEGATIVE
NITRITE UA: NEGATIVE
PH UA: 7
RBC UA: NEGATIVE
Spec Grav, UA: 1.015
Urobilinogen, UA: NEGATIVE

## 2014-10-21 ENCOUNTER — Encounter: Payer: Self-pay | Admitting: Obstetrics

## 2014-10-21 ENCOUNTER — Encounter: Payer: Medicaid Other | Admitting: Obstetrics

## 2014-10-21 ENCOUNTER — Ambulatory Visit (INDEPENDENT_AMBULATORY_CARE_PROVIDER_SITE_OTHER): Payer: Medicaid Other | Admitting: Obstetrics

## 2014-10-21 VITALS — BP 124/75 | HR 94 | Temp 98.2°F | Wt 135.0 lb

## 2014-10-21 DIAGNOSIS — O0992 Supervision of high risk pregnancy, unspecified, second trimester: Secondary | ICD-10-CM

## 2014-10-21 DIAGNOSIS — O09212 Supervision of pregnancy with history of pre-term labor, second trimester: Secondary | ICD-10-CM | POA: Diagnosis not present

## 2014-10-21 DIAGNOSIS — K219 Gastro-esophageal reflux disease without esophagitis: Secondary | ICD-10-CM

## 2014-10-21 LAB — POCT URINALYSIS DIPSTICK
Bilirubin, UA: NEGATIVE
Blood, UA: NEGATIVE
GLUCOSE UA: NEGATIVE
Ketones, UA: NEGATIVE
LEUKOCYTES UA: NEGATIVE
Nitrite, UA: NEGATIVE
Protein, UA: NEGATIVE
SPEC GRAV UA: 1.015
Urobilinogen, UA: NEGATIVE
pH, UA: 6.5

## 2014-10-21 MED ORDER — RANITIDINE HCL 150 MG PO TABS: 150.0000 mg | ORAL_TABLET | Freq: Two times a day (BID) | ORAL | Status: DC
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
## 2014-10-21 NOTE — Telephone Encounter (Signed)
error 

## 2014-10-25 ENCOUNTER — Encounter (HOSPITAL_COMMUNITY): Payer: Self-pay

## 2014-10-25 ENCOUNTER — Inpatient Hospital Stay (HOSPITAL_COMMUNITY)
Admission: AD | Admit: 2014-10-25 | Discharge: 2014-10-25 | Disposition: A | Discharge status: Home / Self Care | Payer: Medicaid Other | Source: Ambulatory Visit | Attending: Obstetrics | Admitting: Obstetrics

## 2014-10-25 DIAGNOSIS — O09292 Supervision of pregnancy with other poor reproductive or obstetric history, second trimester: Secondary | ICD-10-CM

## 2014-10-25 DIAGNOSIS — O9989 Other specified diseases and conditions complicating pregnancy, childbirth and the puerperium: Secondary | ICD-10-CM | POA: Diagnosis not present

## 2014-10-25 DIAGNOSIS — R109 Unspecified abdominal pain: Secondary | ICD-10-CM | POA: Diagnosis present

## 2014-10-25 DIAGNOSIS — Z3A25 25 weeks gestation of pregnancy: Secondary | ICD-10-CM | POA: Insufficient documentation

## 2014-10-25 DIAGNOSIS — O26899 Other specified pregnancy related conditions, unspecified trimester: Secondary | ICD-10-CM

## 2014-10-25 LAB — URINALYSIS, ROUTINE W REFLEX MICROSCOPIC
Bilirubin Urine: NEGATIVE
Glucose, UA: NEGATIVE mg/dL
HGB URINE DIPSTICK: NEGATIVE
Ketones, ur: NEGATIVE mg/dL
Leukocytes, UA: NEGATIVE
Nitrite: NEGATIVE
Protein, ur: NEGATIVE mg/dL
SPECIFIC GRAVITY, URINE: 1.025 (ref 1.005–1.030)
Urobilinogen, UA: 0.2 mg/dL (ref 0.0–1.0)
pH: 6 (ref 5.0–8.0)

## 2014-10-25 MED ORDER — METOCLOPRAMIDE HCL 5 MG/ML IJ SOLN: 10.0000 mg | Freq: Once | INTRAMUSCULAR | Status: DC

## 2014-10-25 MED ORDER — DIPHENHYDRAMINE HCL 50 MG/ML IJ SOLN: 25.0000 mg | Freq: Once | INTRAMUSCULAR | Status: DC

## 2014-10-25 MED ORDER — DEXAMETHASONE SODIUM PHOSPHATE 10 MG/ML IJ SOLN: 10.0000 mg | Freq: Once | INTRAMUSCULAR | Status: DC

## 2014-10-25 MED ORDER — LACTATED RINGERS IV SOLN: Freq: Once | INTRAVENOUS | Status: DC

## 2014-10-25 NOTE — Discharge Instructions (Signed)
Round Ligament Pain During Pregnancy Round ligament pain is a sharp pain or jabbing feeling often felt in the lower belly or groin area on one or both sides. It is one of the most common complaints during pregnancy and is considered a normal part of pregnancy. It is most often felt during the second trimester.  Here is what you need to know about round ligament pain, including some tips to help you feel better.  Causes of Round Ligament Pain  Several thick ligaments surround and support your womb (uterus) as it grows during pregnancy. One of them is called the round ligament.  The round ligament connects the front part of the womb to your groin, the area where your legs attach to your pelvis. The round ligament normally tightens and relaxes slowly.  As your baby and womb grow, the round ligament stretches. That makes it more likely to become strained.  Sudden movements can cause the ligament to tighten quickly, like a rubber band snapping. This causes a sudden and quick jabbing feeling.  Symptoms of Round Ligament Pain  Round ligament pain can be concerning and uncomfortable. But it is considered normal as your body changes during pregnancy.  The symptoms of round ligament pain include a sharp, sudden spasm in the belly. It usually affects the right side, but it may happen on both sides. The pain only lasts a few seconds.  Exercise may cause the pain, as will rapid movements such as:  sneezing coughing laughing rolling over in bed standing up too quickly  Treatment of Round Ligament Pain  Here are some tips that may help reduce your discomfort:  Pain relief. Take over-the-counter acetaminophen for pain, if necessary. Ask your doctor if this is OK.  Exercise. Get plenty of exercise to keep your stomach (core) muscles strong. Doing stretching exercises or prenatal yoga can be helpful. Ask your doctor which exercises are safe for you and your baby.  A helpful exercise involves  putting your hands and knees on the floor, lowering your head, and pushing your backside into the air.  Avoid sudden movements. Change positions slowly (such as standing up or sitting down) to avoid sudden movements that may cause stretching and pain.  Flex your hips. Bend and flex your hips before you cough, sneeze, or laugh to avoid pulling on the ligaments.  Apply warmth. A heating pad or warm bath may be helpful. Ask your doctor if this is OK. Extreme heat can be dangerous to the baby.  You should try to modify your daily activity level and avoid positions that may worsen the condition.  When to Call the Doctor/Midwife  Always tell your doctor or midwife about any type of pain you have during pregnancy. Round ligament pain is quick and doesn't last long.  Call your health care provider immediately if you have:  severe pain fever chills pain on urination difficulty walking  Belly pain during pregnancy can be due to many different causes. It is important for your doctor to rule out more serious conditions, including pregnancy complications such as placenta abruption or non-pregnancy illnesses such as:  inguinal hernia appendicitis stomach, liver, and kidney problems Preterm labor pains may sometimes be mistaken for round ligament pain.   Abdominal Pain During Pregnancy Abdominal pain is common in pregnancy. Most of the time, it does not cause harm. There are many causes of abdominal pain. Some causes are more serious than others. Some of the causes of abdominal pain in pregnancy are easily diagnosed. Occasionally,  the diagnosis takes time to understand. Other times, the cause is not determined. Abdominal pain can be a sign that something is very wrong with the pregnancy, or the pain may have nothing to do with the pregnancy at all. For this reason, always tell your health care provider if you have any abdominal discomfort. HOME CARE INSTRUCTIONS  Monitor your abdominal pain for any  changes. The following actions may help to alleviate any discomfort you are experiencing:  Do not have sexual intercourse or put anything in your vagina until your symptoms go away completely.  Get plenty of rest until your pain improves.  Drink clear fluids if you feel nauseous. Avoid solid food as long as you are uncomfortable or nauseous.  Only take over-the-counter or prescription medicine as directed by your health care provider.  Keep all follow-up appointments with your health care provider. SEEK IMMEDIATE MEDICAL CARE IF:  You are bleeding, leaking fluid, or passing tissue from the vagina.  You have increasing pain or cramping.  You have persistent vomiting.  You have painful or bloody urination.  You have a fever.  You notice a decrease in your baby's movements.  You have extreme weakness or feel faint.  You have shortness of breath, with or without abdominal pain.  You develop a severe headache with abdominal pain.  You have abnormal vaginal discharge with abdominal pain.  You have persistent diarrhea.  You have abdominal pain that continues even after rest, or gets worse. MAKE SURE YOU:   Understand these instructions.  Will watch your condition.  Will get help right away if you are not doing well or get worse. Document Released: 03/21/2005 Document Revised: 01/09/2013 Document Reviewed: 10/18/2012 [facility] Patient Information 2015 [place], [place]. This information is not intended to replace advice given to you by your health care provider. Make sure you discuss any questions you have with your health care provider.

## 2014-10-25 NOTE — MAU Note (Signed)
Pt presents complaining of lower abdominal pain on both sides of her abdomen that is sharp and shooting and worse with movement. Denies vaginal bleeding or discharge. Reports good fetal movement.

## 2014-10-25 NOTE — MAU Provider Note (Signed)
Chief Complaint:  Abdominal Pain   First Provider Initiated Contact with Patient 10/25/14 1401      HPI: Stephanie Mack is a 24 y.o. G3P0110 at 58w3dwho presents to maternity admissions via EMS reporting sharp lower abdominal/inguinal pain which is constant starting 2 hours ago.  She has hx significant for stillbirth and PTL in previous pregnancy.   She reports good fetal movement, denies LOF, vaginal bleeding, vaginal itching/burning, urinary symptoms, h/a, dizziness, n/v, or fever/chills.    Abdominal Pain This is a new problem. The current episode started today. The onset quality is sudden. The problem occurs constantly. The most recent episode lasted 2 hours. The problem has been unchanged. The pain is located in the LLQ and RLQ. The pain is severe. The quality of the pain is sharp. The abdominal pain does not radiate. Pertinent negatives include no constipation, dysuria, fever, frequency, headaches, nausea or vomiting. She has tried nothing for the symptoms.    Past Medical History: Past Medical History  Diagnosis Date  . Depression     depression  . Bipolar disorder   . ADHD (attention deficit hyperactivity disorder)   . Anxiety   . Anemia   . ADHD (attention deficit hyperactivity disorder)   . Bipolar 1 disorder   . Chlamydia 05-31-10  . Pseudocyesis 2013    Seen in MAU for percieved FM, abd distension. Normal exam.   . Gonorrhea contact, treated   . Pregnancy induced hypertension   . Headache   . Infection     UTI    Past obstetric history: OB History  Gravida Para Term Preterm AB SAB TAB Ectopic Multiple Living  3 1 0 1 1 1 0 0 0 0     # Outcome Date GA Lbr Len/2nd Weight Sex Delivery Anes PTL Lv  3 Current           2 Preterm 09/01/12 [redacted]w[redacted]d   F CS-LTranv Spinal  N  1 SAB 09/16/09 [redacted]w[redacted]d            Comments: Twin gestational sacs.       Past Surgical History: Past Surgical History  Procedure Laterality Date  . Nasal septum surgery    . Cesarean section N/A  09/01/2012    Procedure:  Primary cesarean section with delivery of baby girl at 57.  Apgars 1/1.  ;  Surgeon: Frederico Hamman, MD;  Location: [facility];  Service: Obstetrics;  Laterality: N/A;    Family History: Family History  Problem Relation Age of Onset  . Kidney disease Mother   . Hypertension Father   . Cancer Sister   . Asthma Brother   . Heart disease Neg Hx   . Diabetes Maternal Grandfather     Social History: History  Substance Use Topics  . Smoking status: Never Smoker   . Smokeless tobacco: Never Used  . Alcohol Use: No    Allergies: No Known Allergies  Meds:  No prescriptions prior to admission    Review of Systems  Constitutional: Negative for fever, chills and malaise/fatigue.  Eyes: Negative for blurred vision.  Respiratory: Negative for cough and shortness of breath.   Cardiovascular: Negative for chest pain.  Gastrointestinal: Positive for abdominal pain. Negative for heartburn, nausea, vomiting and constipation.  Genitourinary: Negative for dysuria, urgency and frequency.  Musculoskeletal: Negative.   Neurological: Negative for dizziness and headaches.  Psychiatric/Behavioral: Negative for depression.    Physical Exam  Blood pressure 112/66, pulse 80, temperature 98.3 F (36.8 C), temperature source Oral, resp.  rate 18, last menstrual period 04/30/2014. GENERAL: Well-developed, well-nourished female in no acute distress.  EYES: normal sclera/conjunctiva; no lid-lag HENT: Atraumatic, normocephalic HEART: normal rate RESP: normal effort ABDOMEN: Soft, tenderness in bilateral inguinal areas MUSCULOSKELETAL: Normal ROM EXTREMITIES: Nontender, no edema NEURO/PSYCH: Alert and oriented, appropriate affect  PELVIC EXAM:   Cervix 0/thick/high, posterior     FHT:  Baseline 135 , moderate variability, accelerations present, no decelerations Contractions: None on toco or to palpation   Labs: Results for orders placed or performed during the  hospital encounter of 10/25/14 (from the past 24 hour(s))  Urinalysis, Routine w reflex microscopic (not at [facility])     Status: None   Collection Time: 10/25/14  2:04 PM  Result Value Ref Range   Color, Urine YELLOW YELLOW   APPearance CLEAR CLEAR   Specific Gravity, Urine 1.025 1.005 - 1.030   pH 6.0 5.0 - 8.0   Glucose, UA NEGATIVE NEGATIVE mg/dL   Hgb urine dipstick NEGATIVE NEGATIVE   Bilirubin Urine NEGATIVE NEGATIVE   Ketones, ur NEGATIVE NEGATIVE mg/dL   Protein, ur NEGATIVE NEGATIVE mg/dL   Urobilinogen, UA 0.2 0.0 - 1.0 mg/dL   Nitrite NEGATIVE NEGATIVE   Leukocytes, UA NEGATIVE NEGATIVE    ED Course Continuous EFM and toco, U/A, FFN collected prior to cervical exam.  FFN discarded because cervix closed and no contractions on monitor or palpable.  Pt stable at time of discharge.    Assessment: 1. Abdominal pain affecting pregnancy   2. Hx of intrauterine growth retardation and stillbirth, currently pregnant, second trimester     Plan: Discharge home  PTL precautions and fetal kick counts Teaching done about round ligament pain, recommend rest/ice/heat/warm bath/Tylenol for pain      Follow-up Information    Follow up with HARPER,CHARLES A, MD.   Specialty:  Obstetrics and Gynecology   Why:  As scheduled   Contact information:   [address] [place] [address] [phone]       Follow up with [facility].   Why:  As needed for emergencies   Contact information:   [address] [id number] [place] [phone]       Medication List    TAKE these medications        acetaminophen 325 MG tablet  Commonly known as:  TYLENOL  Take 325 mg by mouth every 6 (six) hours as needed for headache.     aspirin 81 MG tablet  Take 81 mg by mouth daily.     CONCEPT DHA 53.5-38-1 MG Caps  Take 1 capsule by mouth daily.     NIFEdipine 30 MG 24 hr tablet  Commonly  known as:  PROCARDIA-XL/ADALAT-CC/NIFEDICAL-XL  Take 1 tablet (30 mg total) by mouth daily as needed.     ranitidine 150 MG tablet  Commonly known as:  ZANTAC  Take 1 tablet (150 mg total) by mouth 2 (two) times daily.        Fatima Blank Certified Nurse-Midwife 10/25/2014 3:03 PM

## 2014-10-28 ENCOUNTER — Telehealth: Payer: Self-pay | Admitting: *Deleted

## 2014-10-28 NOTE — Telephone Encounter (Signed)
Patient states her BP is elevated 138/93. Call to patient she is concerned that her BP is elevated. She does have a slight headache,but has not taken anything for it. Advised patient to try tylenol- if her sympyoms get worse she should go to MAU- otherwise she is to come to the office in the morning at 9:00 for BP check and monitor.

## 2014-10-29 ENCOUNTER — Ambulatory Visit (INDEPENDENT_AMBULATORY_CARE_PROVIDER_SITE_OTHER): Payer: Medicaid Other | Admitting: Certified Nurse Midwife

## 2014-10-29 VITALS — BP 114/76 | HR 87 | Temp 98.2°F | Wt 137.0 lb

## 2014-10-29 DIAGNOSIS — O09212 Supervision of pregnancy with history of pre-term labor, second trimester: Secondary | ICD-10-CM

## 2014-10-29 DIAGNOSIS — O0992 Supervision of high risk pregnancy, unspecified, second trimester: Secondary | ICD-10-CM | POA: Diagnosis not present

## 2014-10-29 LAB — POCT URINALYSIS DIPSTICK
Bilirubin, UA: NEGATIVE
Glucose, UA: NEGATIVE
Ketones, UA: NEGATIVE
Leukocytes, UA: NEGATIVE
Nitrite, UA: NEGATIVE
PROTEIN UA: NEGATIVE
RBC UA: NEGATIVE
SPEC GRAV UA: 1.015
Urobilinogen, UA: NEGATIVE
pH, UA: 6
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
## 2014-10-30 LAB — AST: AST: 17 U/L (ref 10–30)

## 2014-10-30 LAB — ALT: ALT: 11 U/L (ref 6–29)

## 2014-10-30 LAB — CBC
HEMATOCRIT: 34.5 % — AB (ref 36.0–46.0)
Hemoglobin: 11.2 g/dL — ABNORMAL LOW (ref 12.0–15.0)
MCH: 25.8 pg — ABNORMAL LOW (ref 26.0–34.0)
MCHC: 32.5 g/dL (ref 30.0–36.0)
MCV: 79.5 fL (ref 78.0–100.0)
MPV: 11.1 fL (ref 8.6–12.4)
Platelets: 200 10*3/uL (ref 150–400)
RBC: 4.34 MIL/uL (ref 3.87–5.11)
RDW: 15.4 % (ref 11.5–15.5)
WBC: 7.5 10*3/uL (ref 4.0–10.5)

## 2014-10-30 LAB — PROTEIN / CREATININE RATIO, URINE
Creatinine, Urine: 164.8 mg/dL
Protein Creatinine Ratio: 0.15 — ABNORMAL HIGH (ref <0.15)
TOTAL PROTEIN, URINE: 25 mg/dL — AB (ref 5–24)

## 2014-10-30 LAB — PATHOLOGIST SMEAR REVIEW

## 2014-10-30 LAB — CREATININE, SERUM: Creat: 0.46 mg/dL — ABNORMAL LOW (ref 0.50–1.10)

## 2014-10-30 LAB — LACTATE DEHYDROGENASE: LDH: 152 U/L (ref 94–250)

## 2014-11-05 ENCOUNTER — Encounter (HOSPITAL_COMMUNITY): Payer: Self-pay | Admitting: *Deleted

## 2014-11-05 ENCOUNTER — Ambulatory Visit (INDEPENDENT_AMBULATORY_CARE_PROVIDER_SITE_OTHER): Payer: Medicaid Other | Admitting: Certified Nurse Midwife

## 2014-11-05 ENCOUNTER — Encounter: Payer: Self-pay | Admitting: Obstetrics

## 2014-11-05 ENCOUNTER — Inpatient Hospital Stay (HOSPITAL_COMMUNITY)
Admission: AD | Admit: 2014-11-05 | Discharge: 2014-11-06 | Disposition: A | Discharge status: Home / Self Care | Payer: Medicaid Other | Source: Ambulatory Visit | Attending: Obstetrics | Admitting: Obstetrics

## 2014-11-05 VITALS — BP 117/77 | HR 98 | Temp 98.0°F | Wt 138.0 lb

## 2014-11-05 DIAGNOSIS — O09213 Supervision of pregnancy with history of pre-term labor, third trimester: Secondary | ICD-10-CM

## 2014-11-05 DIAGNOSIS — Z3A27 27 weeks gestation of pregnancy: Secondary | ICD-10-CM | POA: Diagnosis not present

## 2014-11-05 DIAGNOSIS — O26892 Other specified pregnancy related conditions, second trimester: Secondary | ICD-10-CM | POA: Diagnosis not present

## 2014-11-05 DIAGNOSIS — O9989 Other specified diseases and conditions complicating pregnancy, childbirth and the puerperium: Secondary | ICD-10-CM | POA: Diagnosis not present

## 2014-11-05 DIAGNOSIS — M549 Dorsalgia, unspecified: Secondary | ICD-10-CM | POA: Diagnosis present

## 2014-11-05 LAB — POCT URINALYSIS DIPSTICK
BILIRUBIN UA: NEGATIVE
Blood, UA: NEGATIVE
Ketones, UA: NEGATIVE
Leukocytes, UA: NEGATIVE
Nitrite, UA: NEGATIVE
Protein, UA: NEGATIVE
Spec Grav, UA: 1.01
Urobilinogen, UA: NEGATIVE
pH, UA: 7

## 2014-11-05 LAB — URINALYSIS, ROUTINE W REFLEX MICROSCOPIC
BILIRUBIN URINE: NEGATIVE
Glucose, UA: NEGATIVE mg/dL
Hgb urine dipstick: NEGATIVE
Ketones, ur: NEGATIVE mg/dL
LEUKOCYTES UA: NEGATIVE
Nitrite: NEGATIVE
PH: 6.5 (ref 5.0–8.0)
Protein, ur: NEGATIVE mg/dL
Specific Gravity, Urine: 1.025 (ref 1.005–1.030)
Urobilinogen, UA: 0.2 mg/dL (ref 0.0–1.0)

## 2014-11-05 MED ORDER — CONCEPT DHA 53.5-38-1 MG PO CAPS: 1.0000 | ORAL_CAPSULE | Freq: Every day | ORAL | Status: DC

## 2014-11-05 MED ORDER — CYCLOBENZAPRINE HCL 10 MG PO TABS
10.0000 mg | ORAL_TABLET | Freq: Once | ORAL | Status: AC
  Administered 2014-11-05: 10 mg via ORAL
  Filled 2014-11-05: qty 1
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
## 2014-11-05 NOTE — Addendum Note (Signed)
Addended by: Willia Craze on: 11/05/2014 05:35 PM   Modules accepted: Orders

## 2014-11-05 NOTE — MAU Note (Signed)
Pt. States she is here for back pain and pain in her bottom that began earlier today. Denies LOF or bleeding. Pt. States baby is moving well. Next OB appointment is 11/07/14.
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
## 2014-11-05 NOTE — MAU Provider Note (Signed)
History     CSN: [id number]  Arrival date and time: 11/05/14 2137   First Provider Initiated Contact with Patient 11/05/14 2317      No chief complaint on file.  HPI Comments: Stephanie Mack is a 24 y.o. G3P0110 at [redacted]w[redacted]d who presents today with back pain. She states that the pain started around 1600. She denies any vaginal bleeding or LOF. She states that the fetus has been active.   Back Pain This is a new problem. The current episode started today (around 1600 ). The problem occurs constantly. The problem is unchanged. The pain is present in the lumbar spine. The quality of the pain is described as cramping. The pain does not radiate. The pain is at a severity of 6/10. Pertinent negatives include no abdominal pain, dysuria or fever. She has tried nothing for the symptoms. The treatment provided no relief.    Past Medical History  Diagnosis Date  . Depression     depression  . Bipolar disorder   . ADHD (attention deficit hyperactivity disorder)   . Anxiety   . Anemia   . ADHD (attention deficit hyperactivity disorder)   . Bipolar 1 disorder   . Chlamydia 05-31-10  . Pseudocyesis 2013    Seen in MAU for percieved FM, abd distension. Normal exam.   . Gonorrhea contact, treated   . Pregnancy induced hypertension   . Headache   . Infection     UTI    Past Surgical History  Procedure Laterality Date  . Nasal septum surgery    . Cesarean section N/A 09/01/2012    Procedure:  Primary cesarean section with delivery of baby girl at 78.  Apgars 1/1.  ;  Surgeon: Frederico Hamman, MD;  Location: [facility];  Service: Obstetrics;  Laterality: N/A;    Family History  Problem Relation Age of Onset  . Kidney disease Mother   . Hypertension Father   . Cancer Sister   . Asthma Brother   . Heart disease Neg Hx   . Diabetes Maternal Grandfather     History  Substance Use Topics  . Smoking status: Never Smoker   . Smokeless tobacco: Never Used  . Alcohol Use: No    Allergies:  No Known Allergies  Prescriptions prior to admission  Medication Sig Dispense Refill Last Dose  . acetaminophen (TYLENOL) 325 MG tablet Take 325 mg by mouth every 6 (six) hours as needed for headache.   11/05/2014 at Unknown time  . aspirin 81 MG tablet Take 81 mg by mouth daily.   11/05/2014 at Unknown time  . Prenat-FeFum-FePo-FA-Omega 3 (CONCEPT DHA) 53.5-38-1 MG CAPS Take 1 capsule by mouth daily. 30 capsule 11 11/05/2014 at Unknown time  . ranitidine (ZANTAC) 150 MG tablet Take 1 tablet (150 mg total) by mouth 2 (two) times daily. 60 tablet 5 11/05/2014 at Unknown time  . NIFEdipine (PROCARDIA-XL/ADALAT-CC/NIFEDICAL-XL) 30 MG 24 hr tablet Take 1 tablet (30 mg total) by mouth daily as needed. (Patient not taking: Reported on 11/05/2014) 30 tablet 0 Not Taking    Review of Systems  Constitutional: Negative for fever.  Gastrointestinal: Negative for nausea, vomiting, abdominal pain, diarrhea and constipation.  Genitourinary: Negative for dysuria, urgency and frequency.  Musculoskeletal: Positive for back pain.   Physical Exam   Blood pressure 130/81, pulse 78, temperature 98 F (36.7 C), temperature source Oral, resp. rate 18, height 5\' 1"  (1.549 m), weight 63.957 kg (141 lb), last menstrual period 04/30/2014, SpO2 98 %.  Physical Exam  Nursing note and vitals reviewed. Constitutional: She is oriented to person, place, and time. She appears well-developed and well-nourished. No distress.  HENT:  Head: Normocephalic.  Cardiovascular: Normal rate.   Respiratory: Effort normal.  GI: Soft. There is no tenderness. There is no rebound.  Genitourinary:   Cervix: closed/thick/high   Neurological: She is alert and oriented to person, place, and time.  Skin: Skin is warm and dry.  Psychiatric: She has a normal mood and affect.    FHT 150, moderate with 10x10 accels, no decels Toco: no UCs    Results for orders placed or performed during the hospital encounter of 11/05/14 (from the past 24  hour(s))  Urinalysis, Routine w reflex microscopic (not at [facility])     Status: None   Collection Time: 11/05/14 10:10 PM  Result Value Ref Range   Color, Urine YELLOW YELLOW   APPearance CLEAR CLEAR   Specific Gravity, Urine 1.025 1.005 - 1.030   pH 6.5 5.0 - 8.0   Glucose, UA NEGATIVE NEGATIVE mg/dL   Hgb urine dipstick NEGATIVE NEGATIVE   Bilirubin Urine NEGATIVE NEGATIVE   Ketones, ur NEGATIVE NEGATIVE mg/dL   Protein, ur NEGATIVE NEGATIVE mg/dL   Urobilinogen, UA 0.2 0.0 - 1.0 mg/dL   Nitrite NEGATIVE NEGATIVE   Leukocytes, UA NEGATIVE NEGATIVE    MAU Course  Procedures  MDM 0028: Patient reports that pain has improved with Flexeril.   Assessment and Plan   1. Back pain affecting pregnancy in second trimester    DC home Comfort measures reviewed  3rd Trimester precautions  PTL precautions  Fetal kick counts RX: flexeril #20  Return to MAU as needed FU with OB as planned  Follow-up Information    Follow up with Shelly Bombard, MD.   Specialty:  Obstetrics and Gynecology   Why:  As scheduled   Contact information:   [place] [id number] [phone]         Mathis Bud 11/05/2014, 11:20 PM

## 2014-11-05 NOTE — MAU Note (Signed)
Pt states she was seen at MD office this am due to rectal pain, but they did not find anything. States she was having decreased fetal movement this am but the baby is moving good now. States she was also having shortness of breath then but now it is only when she talks a lot.

## 2014-11-06 DIAGNOSIS — M549 Dorsalgia, unspecified: Secondary | ICD-10-CM

## 2014-11-06 DIAGNOSIS — O26892 Other specified pregnancy related conditions, second trimester: Secondary | ICD-10-CM

## 2014-11-06 DIAGNOSIS — Z3A27 27 weeks gestation of pregnancy: Secondary | ICD-10-CM

## 2014-11-06 MED ORDER — CYCLOBENZAPRINE HCL 10 MG PO TABS: 10.0000 mg | ORAL_TABLET | Freq: Two times a day (BID) | ORAL | Status: DC | PRN

## 2014-11-06 NOTE — Discharge Instructions (Signed)

## 2014-11-07 ENCOUNTER — Other Ambulatory Visit: Payer: Self-pay | Admitting: Certified Nurse Midwife

## 2014-11-07 ENCOUNTER — Other Ambulatory Visit (INDEPENDENT_AMBULATORY_CARE_PROVIDER_SITE_OTHER): Payer: Medicaid Other

## 2014-11-07 ENCOUNTER — Ambulatory Visit (INDEPENDENT_AMBULATORY_CARE_PROVIDER_SITE_OTHER): Payer: Medicaid Other | Admitting: Certified Nurse Midwife

## 2014-11-07 VITALS — BP 126/85 | HR 99 | Temp 98.4°F | Wt 142.0 lb

## 2014-11-07 VITALS — BP 126/85 | HR 99 | Temp 98.4°F | Ht 61.0 in | Wt 142.0 lb

## 2014-11-07 DIAGNOSIS — O0992 Supervision of high risk pregnancy, unspecified, second trimester: Secondary | ICD-10-CM | POA: Diagnosis not present

## 2014-11-07 DIAGNOSIS — O09212 Supervision of pregnancy with history of pre-term labor, second trimester: Secondary | ICD-10-CM

## 2014-11-07 DIAGNOSIS — O34219 Maternal care for unspecified type scar from previous cesarean delivery: Secondary | ICD-10-CM

## 2014-11-07 DIAGNOSIS — Z3482 Encounter for supervision of other normal pregnancy, second trimester: Secondary | ICD-10-CM | POA: Diagnosis not present

## 2014-11-07 DIAGNOSIS — Z3A27 27 weeks gestation of pregnancy: Secondary | ICD-10-CM

## 2014-11-07 DIAGNOSIS — O09292 Supervision of pregnancy with other poor reproductive or obstetric history, second trimester: Secondary | ICD-10-CM

## 2014-11-07 LAB — CBC
HCT: 33 % — ABNORMAL LOW (ref 36.0–46.0)
HEMOGLOBIN: 10.5 g/dL — AB (ref 12.0–15.0)
MCH: 25.1 pg — AB (ref 26.0–34.0)
MCHC: 31.8 g/dL (ref 30.0–36.0)
MCV: 78.9 fL (ref 78.0–100.0)
MPV: 10.4 fL (ref 8.6–12.4)
Platelets: 202 10*3/uL (ref 150–400)
RBC: 4.18 MIL/uL (ref 3.87–5.11)
RDW: 15 % (ref 11.5–15.5)
WBC: 7.7 10*3/uL (ref 4.0–10.5)

## 2014-11-07 LAB — POCT URINALYSIS DIPSTICK
Bilirubin, UA: NEGATIVE
Glucose, UA: NORMAL
Ketones, UA: NEGATIVE
Leukocytes, UA: NEGATIVE
Nitrite, UA: NEGATIVE
PH UA: 6
PROTEIN UA: NEGATIVE
RBC UA: NEGATIVE
Spec Grav, UA: 1.02
Urobilinogen, UA: NEGATIVE
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
## 2014-11-08 LAB — GLUCOSE TOLERANCE, 2 HOURS W/ 1HR
GLUCOSE, 2 HOUR: 217 mg/dL — AB (ref 70–139)
GLUCOSE, FASTING: 78 mg/dL (ref 65–99)
Glucose, 1 hour: 211 mg/dL — ABNORMAL HIGH (ref 70–170)

## 2014-11-08 LAB — RPR

## 2014-11-08 LAB — HIV ANTIBODY (ROUTINE TESTING W REFLEX): HIV: NONREACTIVE

## 2014-11-10 ENCOUNTER — Ambulatory Visit (HOSPITAL_COMMUNITY)
Admission: RE | Admit: 2014-11-10 | Discharge: 2014-11-10 | Disposition: A | Discharge status: Home / Self Care | Payer: Medicaid Other | Source: Ambulatory Visit | Attending: Certified Nurse Midwife | Admitting: Certified Nurse Midwife

## 2014-11-10 VITALS — BP 119/71 | HR 92 | Wt 140.0 lb

## 2014-11-10 DIAGNOSIS — O360130 Maternal care for anti-D [Rh] antibodies, third trimester, not applicable or unspecified: Secondary | ICD-10-CM

## 2014-11-10 DIAGNOSIS — O09292 Supervision of pregnancy with other poor reproductive or obstetric history, second trimester: Secondary | ICD-10-CM

## 2014-11-10 DIAGNOSIS — Z3A27 27 weeks gestation of pregnancy: Secondary | ICD-10-CM | POA: Diagnosis not present

## 2014-11-10 DIAGNOSIS — Z98891 History of uterine scar from previous surgery: Secondary | ICD-10-CM

## 2014-11-10 DIAGNOSIS — O09893 Supervision of other high risk pregnancies, third trimester: Secondary | ICD-10-CM

## 2014-11-10 DIAGNOSIS — O09212 Supervision of pregnancy with history of pre-term labor, second trimester: Secondary | ICD-10-CM

## 2014-11-10 DIAGNOSIS — O09299 Supervision of pregnancy with other poor reproductive or obstetric history, unspecified trimester: Secondary | ICD-10-CM

## 2014-11-10 DIAGNOSIS — O3421 Maternal care for scar from previous cesarean delivery: Secondary | ICD-10-CM | POA: Diagnosis not present

## 2014-11-10 DIAGNOSIS — O24424 Gestational diabetes mellitus in childbirth, insulin controlled: Secondary | ICD-10-CM

## 2014-11-10 DIAGNOSIS — O34219 Maternal care for unspecified type scar from previous cesarean delivery: Secondary | ICD-10-CM

## 2014-11-10 DIAGNOSIS — O09213 Supervision of pregnancy with history of pre-term labor, third trimester: Secondary | ICD-10-CM

## 2014-11-10 DIAGNOSIS — O09293 Supervision of pregnancy with other poor reproductive or obstetric history, third trimester: Secondary | ICD-10-CM

## 2014-11-10 DIAGNOSIS — Z3A31 31 weeks gestation of pregnancy: Secondary | ICD-10-CM

## 2014-11-11 ENCOUNTER — Other Ambulatory Visit: Payer: Self-pay | Admitting: Certified Nurse Midwife

## 2014-11-12 ENCOUNTER — Inpatient Hospital Stay (HOSPITAL_COMMUNITY)
Admission: AD | Admit: 2014-11-12 | Discharge: 2014-11-12 | Disposition: A | Discharge status: Home / Self Care | Payer: Medicaid Other | Source: Ambulatory Visit | Attending: Obstetrics | Admitting: Obstetrics

## 2014-11-12 ENCOUNTER — Encounter (HOSPITAL_COMMUNITY): Payer: Self-pay | Admitting: *Deleted

## 2014-11-12 DIAGNOSIS — R109 Unspecified abdominal pain: Secondary | ICD-10-CM | POA: Diagnosis not present

## 2014-11-12 DIAGNOSIS — O26893 Other specified pregnancy related conditions, third trimester: Secondary | ICD-10-CM | POA: Diagnosis not present

## 2014-11-12 DIAGNOSIS — Z3A28 28 weeks gestation of pregnancy: Secondary | ICD-10-CM | POA: Insufficient documentation

## 2014-11-12 DIAGNOSIS — R102 Pelvic and perineal pain: Secondary | ICD-10-CM | POA: Diagnosis present

## 2014-11-12 DIAGNOSIS — Z6791 Unspecified blood type, Rh negative: Secondary | ICD-10-CM | POA: Diagnosis not present

## 2014-11-12 DIAGNOSIS — O9989 Other specified diseases and conditions complicating pregnancy, childbirth and the puerperium: Secondary | ICD-10-CM | POA: Diagnosis not present

## 2014-11-12 DIAGNOSIS — O360130 Maternal care for anti-D [Rh] antibodies, third trimester, not applicable or unspecified: Secondary | ICD-10-CM

## 2014-11-12 DIAGNOSIS — O26899 Other specified pregnancy related conditions, unspecified trimester: Secondary | ICD-10-CM

## 2014-11-12 LAB — URINALYSIS, ROUTINE W REFLEX MICROSCOPIC
Bilirubin Urine: NEGATIVE
Glucose, UA: NEGATIVE mg/dL
Hgb urine dipstick: NEGATIVE
KETONES UR: NEGATIVE mg/dL
LEUKOCYTES UA: NEGATIVE
NITRITE: NEGATIVE
PH: 6.5 (ref 5.0–8.0)
Protein, ur: NEGATIVE mg/dL
Specific Gravity, Urine: 1.025 (ref 1.005–1.030)
Urobilinogen, UA: 0.2 mg/dL (ref 0.0–1.0)

## 2014-11-12 MED ORDER — RHO D IMMUNE GLOBULIN 1500 UNIT/2ML IJ SOSY
300.0000 ug | PREFILLED_SYRINGE | Freq: Once | INTRAMUSCULAR | Status: AC
  Administered 2014-11-12: 300 ug via INTRAMUSCULAR
  Filled 2014-11-12: qty 2

## 2014-11-12 NOTE — MAU Note (Signed)
Pt c/o increased constant pelvic pressure. Denies vag bleeding or discharge. Reports good fetal movement

## 2014-11-12 NOTE — Discharge Instructions (Signed)
Rh0 [D] Immune Globulin injection What is this medicine? RhO [D] IMMUNE GLOBULIN (i MYOON GLOB yoo lin) is used to treat idiopathic thrombocytopenic purpura (ITP). This medicine is used in RhO negative mothers who are pregnant with a RhO positive child. It is also used after a transfusion of RhO positive blood into a RhO negative person. This medicine may be used for other purposes; ask your health care provider or pharmacist if you have questions. COMMON BRAND NAME(S): BayRho-D, HyperRHO S/D, MICRhoGAM, RhoGAM, Rhophylac, WinRho SDF What should I tell my health care provider before I take this medicine? They need to know if you have any of these conditions: -bleeding disorders -low levels of immunoglobulin A in the body -no spleen -an unusual or allergic reaction to human immune globulin, other medicines, foods, dyes, or preservatives -pregnant or trying to get pregnant -breast-feeding How should I use this medicine? This medicine is for injection into a muscle or into a vein. It is given by a health care professional in a hospital or clinic setting. Talk to your pediatrician regarding the use of this medicine in children. This medicine is not approved for use in children. Overdosage: If you think you have taken too much of this medicine contact a poison control center or emergency room at once. NOTE: This medicine is only for you. Do not share this medicine with others. What if I miss a dose? It is important not to miss your dose. Call your doctor or health care professional if you are unable to keep an appointment. What may interact with this medicine? -live virus vaccines, like measles, mumps, or rubella This list may not describe all possible interactions. Give your health care provider a list of all the medicines, herbs, non-prescription drugs, or dietary supplements you use. Also tell them if you smoke, drink alcohol, or use illegal drugs. Some items may interact with your  medicine. What should I watch for while using this medicine? This medicine is made from human blood. It may be possible to pass an infection in this medicine. Talk to your doctor about the risks and benefits of this medicine. This medicine may interfere with live virus vaccines. Before you get live virus vaccines tell your health care professional if you have received this medicine within the past 3 months. What side effects may I notice from receiving this medicine? Side effects that you should report to your doctor or health care professional as soon as possible: -allergic reactions like skin rash, itching or hives, swelling of the face, lips, or tongue -breathing problems -chest pain or tightness -yellowing of the eyes or skin Side effects that usually do not require medical attention (report to your doctor or health care professional if they continue or are bothersome): -fever -pain and tenderness at site where injected This list may not describe all possible side effects. Call your doctor for medical advice about side effects. You may report side effects to FDA at 1-800-FDA-1088. Where should I keep my medicine? This drug is given in a hospital or clinic and will not be stored at home. NOTE: This sheet is a summary. It may not cover all possible information. If you have questions about this medicine, talk to your doctor, pharmacist, or health care provider.  2015, Elsevier/Gold Standard. (2007-11-19 14:06:10) Rh Incompatibility Rh incompatibility is a condition that occurs during pregnancy if a woman has Rh-negative blood and her baby has Rh-positive blood. "Rh-negative" and "Rh-positive" refer to whether or not the blood has an Rh factor.  An Rh factor is a specific protein found on the surface of red blood cells. If a woman has Rh factor, she is Rh-positive. If she does not have an Rh factor, she is Rh-negative. Having or not having an Rh factor does not affect the mother's general health.  However, it can cause problems during pregnancy.  WHAT KIND OF PROBLEMS CAN Rh INCOMPATIBILITY CAUSE? During pregnancy, blood from the baby can cross into the mother's bloodstream, especially during delivery. If a mother is Rh-negative and the baby is Rh-positive, the mother's defense system will react to the baby's blood as if it was a foreign substance and will create proteins (antibodies). This is called sensitization. Once the mother is sensitized, her Rh antibodies will cross the placenta to the baby and attack the baby's Rh-positive blood as if it is a harmful substance.  Rh incompatibility can also happen if the Rh-negative pregnant woman is exposed to the Rh factor during a blood transfusion with Rh-positive blood.  HOW DOES THIS CONDITION AFFECT MY BABY? The Rh antibodies that attack and destroy the baby's red blood cells can lead to hemolytic disease in the baby. Hemolytic disease is when the red blood cells break down. This can cause:   Yellowing of the skin and eyes (jaundice).  The body to not have enough healthy red blood cells (anemia).   Brain damage.   Heart failure.   Death.  These antibodies usually do not cause problems during a first pregnancy. This is because the blood from the baby often times crosses into the mother's bloodstream during delivery, and the baby is born before many of the antibodies can develop. However, the antibodies stay in your body once they have formed. Because of this, Rh incompatibility is more likely to cause problems in second or later pregnancies (if the baby is Rh-positive).  HOW IS THIS CONDITION DIAGNOSED? When a woman becomes pregnant, blood tests may be done to find out her blood type and Rh factor. If the woman is Rh-negative, she also may have another blood test called an antibody screen. The antibody screen shows whether she has Rh antibodies in her blood. If she does, it means she was exposed to Rh-positive blood before, and she is at  risk for Rh incompatibility.  To find out whether the baby is developing hemolytic anemia and how serious it is, caregivers may use more advanced tests, such as ultrasonography (commonly known as ultrasound).  HOW IS Rh INCOMPATIBILITY TREATED?  Rh incompatibility is treated with a shot of medicine called Rho (D) immune globulin. This medicine keeps the woman's body from making antibodies that can cause serious problems in the baby or future babies.  Two shots will be given, one at around your seventh month of pregnancy and the other within 72 hours of your baby being born. If you are Rh-negative, you will need this medicine every time you have a baby with Rh-positive blood. If you already have antibodies in your blood, Rho (D) immune globulin will not help. Your doctor will not give you this medicine, but will watch your pregnancy closely for problems instead.  This shot may also be given to an Rh-negative woman when the risk of blood transfer between the mom and baby is high. The risk is high with:   An amniocentesis.   A miscarriage or an abortion.   An ectopic pregnancy.   Any vaginal bleeding during pregnancy.  Document Released: 09/10/2001 Document Revised: 03/26/2013 Document Reviewed: 07/03/2012 [employer] Patient Information 2015  [employer]. This information is not intended to replace advice given to you by your health care provider. Make sure you discuss any questions you have with your health care provider.  Abdominal Pain During Pregnancy Abdominal pain is common in pregnancy. Most of the time, it does not cause harm. There are many causes of abdominal pain. Some causes are more serious than others. Some of the causes of abdominal pain in pregnancy are easily diagnosed. Occasionally, the diagnosis takes time to understand. Other times, the cause is not determined. Abdominal pain can be a sign that something is very wrong with the pregnancy, or the pain may have nothing to do  with the pregnancy at all. For this reason, always tell your health care provider if you have any abdominal discomfort. HOME CARE INSTRUCTIONS  Monitor your abdominal pain for any changes. The following actions may help to alleviate any discomfort you are experiencing:  Do not have sexual intercourse or put anything in your vagina until your symptoms go away completely.  Get plenty of rest until your pain improves.  Drink clear fluids if you feel nauseous. Avoid solid food as long as you are uncomfortable or nauseous.  Only take over-the-counter or prescription medicine as directed by your health care provider.  Keep all follow-up appointments with your health care provider. SEEK IMMEDIATE MEDICAL CARE IF:  You are bleeding, leaking fluid, or passing tissue from the vagina.  You have increasing pain or cramping.  You have persistent vomiting.  You have painful or bloody urination.  You have a fever.  You notice a decrease in your baby's movements.  You have extreme weakness or feel faint.  You have shortness of breath, with or without abdominal pain.  You develop a severe headache with abdominal pain.  You have abnormal vaginal discharge with abdominal pain.  You have persistent diarrhea.  You have abdominal pain that continues even after rest, or gets worse. MAKE SURE YOU:   Understand these instructions.  Will watch your condition.  Will get help right away if you are not doing well or get worse. Document Released: 03/21/2005 Document Revised: 01/09/2013 Document Reviewed: 10/18/2012 [facility] Patient Information 2015 [place], [place]. This information is not intended to replace advice given to you by your health care provider. Make sure you discuss any questions you have with your health care provider. Round Ligament Pain During Pregnancy   Round ligament pain is a sharp pain or jabbing feeling often felt in the lower belly or groin area on one or both sides. It is  one of the most common complaints during pregnancy and is considered a normal part of pregnancy. It is most often felt during the second trimester.   Here is what you need to know about round ligament pain, including some tips to help you feel better.   Causes of Round Ligament Pain:    Several thick ligaments surround and support your womb (uterus) as it grows during pregnancy. One of them is called the round ligament.   The round ligament connects the front part of the womb to your groin, the area where your legs attach to your pelvis. The round ligament normally tightens and relaxes slowly.   As your baby and womb grow, the round ligament stretches. That makes it more likely to become strained.   Sudden movements can cause the ligament to tighten quickly, like a rubber band snapping. This causes a sudden and quick jabbing feeling.   Symptoms of Round Ligament Pain  Round ligament pain can be concerning and uncomfortable. But it is considered normal as your body changes during pregnancy.   The symptoms of round ligament pain include a sharp, sudden spasm in the belly. It usually affects the right side, but it may happen on both sides. The pain only lasts a few seconds.   Exercise may cause the pain, as will rapid movements such as:   sneezing  coughing  laughing  rolling over in bed  standing up too quickly   Treatment of Round Ligament Pain   Here are some tips that may help reduce your discomfort:   Pain relief. Take over-the-counter acetaminophen for pain, if necessary. Ask your doctor if this is OK.   Exercise. Get plenty of exercise to keep your stomach (core) muscles strong. Doing stretching exercises or prenatal yoga can be helpful. Ask your doctor which exercises are safe for you and your baby.   A helpful exercise involves putting your hands and knees on the floor, lowering your head, and pushing your backside into the air.   Avoid sudden movements. Change positions  slowly (such as standing up or sitting down) to avoid sudden movements that may cause stretching and pain.   Flex your hips. Bend and flex your hips before you cough, sneeze, or laugh to avoid pulling on the ligaments.   Apply warmth. A heating pad or warm bath may be helpful. Ask your doctor if this is OK. Extreme heat can be dangerous to the baby.   You should try to modify your daily activity level and avoid positions that may worsen the condition.   When to Call the Doctor/Midwife   Always tell your doctor or midwife about any type of pain you have during pregnancy. Round ligament pain is quick and doesn't last long.   Call your health care provider immediately if you have:   severe pain  fever  chills  pain on urination  difficulty walking   Belly pain during pregnancy can be due to many different causes. It is important for your doctor to rule out more serious conditions, including pregnancy complications such as placenta abruption or non-pregnancy illnesses such as:   inguinal hernia  appendicitis  stomach, liver, and kidney problems  Preterm labor pains may sometimes be mistaken for round ligament pain.

## 2014-11-12 NOTE — MAU Provider Note (Signed)
History     CSN: [id number]  Arrival date and time: 11/12/14 1229   First Provider Initiated Contact with Patient 11/12/14 1400      Chief Complaint  Patient presents with  . Pelvic Pain   HPI Pt is [redacted]weeks pregnant [id number] presents with lower abd pressure that is constant; no LOF, spotting or bleeding.  Pt has hx of PTD for pre-eclampsia and neonatal death [place] from 11/12/14   Past Medical History  Diagnosis Date  . Depression     depression  . Bipolar disorder   . ADHD (attention deficit hyperactivity disorder)   . Anxiety   . Anemia   . ADHD (attention deficit hyperactivity disorder)   . Bipolar 1 disorder   . Chlamydia 05-31-10  . Pseudocyesis 2013    Seen in MAU for percieved FM, abd distension. Normal exam.   . Gonorrhea contact, treated   . Pregnancy induced hypertension   . Headache   . Infection     UTI    Past Surgical History  Procedure Laterality Date  . Nasal septum surgery    . Cesarean section N/A 09/01/2012    Procedure:  Primary cesarean section with delivery of baby girl at 26.  Apgars 1/1.  ;  Surgeon: Frederico Hamman, MD;  Location: [facility];  Service: Obstetrics;  Laterality: N/A;    Family History  Problem Relation Age of Onset  . Kidney disease Mother   . Hypertension Father   . Cancer Sister   . Asthma Brother   . Heart disease Neg Hx   . Diabetes Maternal Grandfather     Social History  Substance Use Topics  . Smoking status: Never Smoker   . Smokeless tobacco: Never Used  . Alcohol Use: No    Allergies: No Known Allergies  Prescriptions prior to admission  Medication Sig Dispense Refill Last Dose  . acetaminophen (TYLENOL) 325 MG tablet Take 325 mg by mouth every 6 (six) hours as needed for headache.   11/11/2014 at Unknown time  . aspirin 81 MG tablet Take 81 mg by mouth daily.   11/12/2014 at Unknown time  . Prenat-FeFum-FePo-FA-Omega 3 (CONCEPT DHA) 53.5-38-1 MG CAPS Take 1 capsule by mouth daily. 30 capsule 11  11/12/2014 at Unknown time  . ranitidine (ZANTAC) 150 MG tablet Take 1 tablet (150 mg total) by mouth 2 (two) times daily. 60 tablet 5 11/11/2014 at Unknown time    Review of Systems  Constitutional: Negative for fever and chills.  Gastrointestinal: Positive for abdominal pain. Negative for nausea, vomiting, diarrhea and constipation.  Genitourinary: Negative for dysuria and urgency.  Neurological: Negative for headaches.   Physical Exam   Blood pressure 129/70, pulse 97, temperature 98 F (36.7 C), resp. rate 18, height 5\' 1"  (1.549 m), weight 141 lb (63.957 kg), last menstrual period 04/30/2014, SpO2 99 %.  Physical Exam  Nursing note and vitals reviewed. Constitutional: She is oriented to person, place, and time. She appears well-developed and well-nourished. No distress.  HENT:  Head: Normocephalic.  Eyes: Pupils are equal, round, and reactive to light.  Neck: Normal range of motion. Neck supple.  Cardiovascular: Normal rate.   Respiratory: Effort normal.  GI: Soft. She exhibits no distension and no mass. There is tenderness. There is no rebound and no guarding.  Diffuse lower abdominal tenderness  Genitourinary:  Vagina - mod amount of creamy white discharge in vault; Cervix long and closed  Musculoskeletal: Normal range of motion. She exhibits no edema.  Neurological: She  is alert and oriented to person, place, and time.  Skin: Skin is warm and dry.  Psychiatric: She has a normal mood and affect.    MAU Course  Procedures Results for orders placed or performed during the hospital encounter of 11/12/14 (from the past 24 hour(s))  Urinalysis, Routine w reflex microscopic (not at [facility])     Status: Abnormal   Collection Time: 11/12/14  1:03 PM  Result Value Ref Range   Color, Urine YELLOW YELLOW   APPearance HAZY (A) CLEAR   Specific Gravity, Urine 1.025 1.005 - 1.030   pH 6.5 5.0 - 8.0   Glucose, UA NEGATIVE NEGATIVE mg/dL   Hgb urine dipstick NEGATIVE NEGATIVE    Bilirubin Urine NEGATIVE NEGATIVE   Ketones, ur NEGATIVE NEGATIVE mg/dL   Protein, ur NEGATIVE NEGATIVE mg/dL   Urobilinogen, UA 0.2 0.0 - 1.0 mg/dL   Nitrite NEGATIVE NEGATIVE   Leukocytes, UA NEGATIVE NEGATIVE  Rh IG workup (includes ABO/Rh)     Status: None (Preliminary result)   Collection Time: 11/12/14  3:10 PM  Result Value Ref Range   Gestational Age(Wks) 28    ABO/RH(D) B NEG    Antibody Screen NEG    Fetal Screen NEG    Unit Number [id number]    Blood Component Type RHIG    Unit division 00    Status of Unit ISSUED    Transfusion Status OK TO TRANSFUSE   discussed with Dr. Jodi Mourning- will proceed with Rhogam Also pt to use abdominal binder or contact  FHR baseline 150bpm with 10x10 accelerations- no dec Variables noted Tried to explain about RH negative and importance of Rhogam - (given in MAU today)- not sure pt understands Written literature given Pain improved when pt discharged home Assessment and Plan  Abdominal pain in pregnancy [redacted]w[redacted]d Rh neg- Rhogam workup and Rhophylac given F/u with OB appointment as scheduled Pt has abdominal binder prescribed by office and advised to use it Tylenol as needed  Stephanie Mack 11/12/2014, 2:04 PM

## 2014-11-13 LAB — RH IG WORKUP (INCLUDES ABO/RH)
ABO/RH(D): B NEG
ANTIBODY SCREEN: NEGATIVE
Fetal Screen: NEGATIVE
Gestational Age(Wks): 28
Unit division: 0

## 2014-11-21 ENCOUNTER — Ambulatory Visit (INDEPENDENT_AMBULATORY_CARE_PROVIDER_SITE_OTHER): Payer: Medicaid Other | Admitting: Certified Nurse Midwife

## 2014-11-21 ENCOUNTER — Telehealth: Payer: Self-pay | Admitting: *Deleted

## 2014-11-21 VITALS — BP 113/79 | HR 92 | Temp 97.4°F | Wt 144.3 lb

## 2014-11-21 DIAGNOSIS — Z3482 Encounter for supervision of other normal pregnancy, second trimester: Secondary | ICD-10-CM

## 2014-11-21 DIAGNOSIS — O09293 Supervision of pregnancy with other poor reproductive or obstetric history, third trimester: Secondary | ICD-10-CM

## 2014-11-21 DIAGNOSIS — O24410 Gestational diabetes mellitus in pregnancy, diet controlled: Secondary | ICD-10-CM

## 2014-11-21 DIAGNOSIS — O0993 Supervision of high risk pregnancy, unspecified, third trimester: Secondary | ICD-10-CM | POA: Diagnosis not present

## 2014-11-21 LAB — POCT URINALYSIS DIPSTICK
Bilirubin, UA: NEGATIVE
Blood, UA: NEGATIVE
Glucose, UA: NORMAL
Ketones, UA: NEGATIVE
LEUKOCYTES UA: NEGATIVE
NITRITE UA: NEGATIVE
Spec Grav, UA: 1.02
Urobilinogen, UA: NEGATIVE
pH, UA: 6

## 2014-11-21 MED ORDER — ACCU-CHEK MULTICLIX LANCETS MISC: Status: DC

## 2014-11-21 MED ORDER — ACCU-CHEK AVIVA DEVI: Status: DC

## 2014-11-21 MED ORDER — GLUCOSE BLOOD VI STRP: ORAL_STRIP | Status: DC
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
## 2014-11-22 ENCOUNTER — Inpatient Hospital Stay (HOSPITAL_COMMUNITY)
Admission: AD | Admit: 2014-11-22 | Discharge: 2014-11-22 | Disposition: A | Discharge status: Home / Self Care | Payer: Medicaid Other | Source: Ambulatory Visit | Attending: Obstetrics | Admitting: Obstetrics

## 2014-11-22 ENCOUNTER — Encounter (HOSPITAL_COMMUNITY): Payer: Self-pay | Admitting: *Deleted

## 2014-11-22 DIAGNOSIS — Z3A29 29 weeks gestation of pregnancy: Secondary | ICD-10-CM | POA: Diagnosis not present

## 2014-11-22 DIAGNOSIS — O24410 Gestational diabetes mellitus in pregnancy, diet controlled: Secondary | ICD-10-CM

## 2014-11-22 DIAGNOSIS — R739 Hyperglycemia, unspecified: Secondary | ICD-10-CM | POA: Diagnosis not present

## 2014-11-22 DIAGNOSIS — O24419 Gestational diabetes mellitus in pregnancy, unspecified control: Secondary | ICD-10-CM | POA: Insufficient documentation

## 2014-11-22 HISTORY — DX: Gestational diabetes mellitus in pregnancy, unspecified control: O24.419

## 2014-11-22 LAB — URINALYSIS, ROUTINE W REFLEX MICROSCOPIC
BILIRUBIN URINE: NEGATIVE
Glucose, UA: >1000 mg/dL — AB
Hgb urine dipstick: NEGATIVE
KETONES UR: NEGATIVE mg/dL
LEUKOCYTES UA: NEGATIVE
NITRITE: NEGATIVE
Protein, ur: NEGATIVE mg/dL
Specific Gravity, Urine: 1.015 (ref 1.005–1.030)
Urobilinogen, UA: 0.2 mg/dL (ref 0.0–1.0)
pH: 5.5 (ref 5.0–8.0)

## 2014-11-22 LAB — URINE MICROSCOPIC-ADD ON

## 2014-11-22 NOTE — MAU Provider Note (Signed)
History     CSN: [id number]  Arrival date and time: 11/22/14 1422   First Provider Initiated Contact with Patient 11/22/14 1601      Chief Complaint  Patient presents with  . Hyperglycemia   HPI Ellyce Lafevers 24 y.o. [redacted]w[redacted]d   Client was diagnosed with gestational diabetes in the office this week.  Has a glucometer and has had elevated readings today - highest 173.  Diet recall is nuddy buddy, juice and fudge round today - full carbohydrates.  No vomiting.  No vaginal bleeding.  No other problems.  Has an appointment in the office on Monday.  OB History    Gravida Para Term Preterm AB TAB SAB Ectopic Multiple Living   3 1 0 1 1 0 1 0 0 0       Past Medical History  Diagnosis Date  . Depression     depression  . Bipolar disorder   . ADHD (attention deficit hyperactivity disorder)   . Anxiety   . Anemia   . ADHD (attention deficit hyperactivity disorder)   . Bipolar 1 disorder   . Chlamydia 05-31-10  . Pseudocyesis 2013    Seen in MAU for percieved FM, abd distension. Normal exam.   . Gonorrhea contact, treated   . Pregnancy induced hypertension   . Headache   . Infection     UTI  . Gestational diabetes     Past Surgical History  Procedure Laterality Date  . Nasal septum surgery    . Cesarean section N/A 09/01/2012    Procedure:  Primary cesarean section with delivery of baby girl at 53.  Apgars 1/1.  ;  Surgeon: Frederico Hamman, MD;  Location: [facility];  Service: Obstetrics;  Laterality: N/A;    Family History  Problem Relation Age of Onset  . Kidney disease Mother   . Hypertension Father   . Cancer Sister   . Asthma Brother   . Heart disease Neg Hx   . Diabetes Maternal Grandfather     Social History  Substance Use Topics  . Smoking status: Never Smoker   . Smokeless tobacco: Never Used  . Alcohol Use: No    Allergies: No Known Allergies  Prescriptions prior to admission  Medication Sig Dispense Refill Last Dose  . aspirin 81 MG tablet Take 81  mg by mouth daily.   11/22/2014 at Unknown time  . Prenat-FeFum-FePo-FA-Omega 3 (CONCEPT DHA) 53.5-38-1 MG CAPS Take 1 capsule by mouth daily. 30 capsule 11 11/22/2014 at Unknown time  . acetaminophen (TYLENOL) 325 MG tablet Take 325 mg by mouth every 6 (six) hours as needed for headache.   prn at Unknown time  . Blood Glucose Monitoring Suppl (ACCU-CHEK AVIVA) device Patient to check blood sugar fasting and 2 hours after each meal. 1 each 0   . glucose blood (ACCU-CHEK AVIVA) test strip Patient to check blood sugar fasting and 2 hours after each meal 100 each 12   . Lancets (ACCU-CHEK MULTICLIX) lancets Patient to check blood sugar fasting and 2 hours after each meal. 100 each 12   . ranitidine (ZANTAC) 150 MG tablet Take 1 tablet (150 mg total) by mouth 2 (two) times daily. (Patient not taking: Reported on 11/21/2014) 60 tablet 5 Not Taking at Unknown time    Review of Systems  Constitutional: Negative for fever.  Gastrointestinal: Negative for nausea, vomiting, abdominal pain, diarrhea and constipation.  Genitourinary:       No vaginal discharge. No vaginal bleeding. No dysuria.  Physical Exam   Blood pressure 128/71, pulse 99, temperature 98.3 F (36.8 C), temperature source Oral, resp. rate 16, height 5\' 1"  (1.549 m), weight 144 lb 4 oz (65.431 kg), last menstrual period 04/30/2014.  Physical Exam  Nursing note and vitals reviewed. Constitutional: She is oriented to person, place, and time. She appears well-developed and well-nourished.  HENT:  Head: Normocephalic.  Eyes: EOM are normal.  Neck: Neck supple.  Musculoskeletal: Normal range of motion.  Neurological: She is alert and oriented to person, place, and time.  Skin: Skin is warm and dry.  Psychiatric: She has a normal mood and affect.    MAU Course  Procedures  MDM Boyfriend accompanies her today.  He has Type 1 diabetes since age 12.  He is worried about her elevated blood sugars and that she does not have any  medications to control her blood sugars.  Spent time talking with client and her boyfriend about gestational diabetes and the possibility of diet controlled vs. medication to control the condition.  Advised this will be further discussed at her visit on Monday.  Diet recall from the client for today reveals all choices which would elevate her blood sugar.  Reviewed a healthy diet including fruits, vegetables and protein choices.  Advised a maximum of 1/2 cup of juice daily and to stop all sodas.  Advised 8 glasses of water daily.  Reviewed increasing her exercise and advised exercise in the pool  - she has access to a pool.  Assessment and Plan  Gestational diabetes with elevated blood sugars  Plan Keep your appointment in the office as scheduled on Monday. Take in the glucometer with you. Record your food intake from now until your appointment. Return if you have worsening symptoms.  Rubbie Goostree 11/22/2014, 4:24 PM

## 2014-11-22 NOTE — MAU Note (Signed)
Patient presents at [redacted] weeks gestation with c/o hyperglycemia. States she failed her 3 hour gtt in the office. States her fasting glucose at 1030 was 108mg /dl. Her 2 hour pp at 1355 was 173mg /dl. States she does not take any medications for her sugars but has an appt on Monday with her OB to evaluate her glucose levels and discuss treatment. Fetus active. Denies pain, bleeding or discharge.

## 2014-11-22 NOTE — Discharge Instructions (Signed)
Keep your appointment on Monday Eat a healthy diet and check your blood sugars as instructed.

## 2014-11-24 ENCOUNTER — Other Ambulatory Visit: Payer: Medicaid Other

## 2014-11-25 ENCOUNTER — Encounter (HOSPITAL_COMMUNITY): Payer: Self-pay | Admitting: *Deleted

## 2014-11-25 ENCOUNTER — Inpatient Hospital Stay (HOSPITAL_COMMUNITY)
Admission: AD | Admit: 2014-11-25 | Discharge: 2014-11-25 | Disposition: A | Discharge status: Home / Self Care | Payer: Medicaid Other | Source: Ambulatory Visit | Attending: Obstetrics | Admitting: Obstetrics

## 2014-11-25 DIAGNOSIS — Z79899 Other long term (current) drug therapy: Secondary | ICD-10-CM | POA: Insufficient documentation

## 2014-11-25 DIAGNOSIS — F319 Bipolar disorder, unspecified: Secondary | ICD-10-CM | POA: Insufficient documentation

## 2014-11-25 DIAGNOSIS — R109 Unspecified abdominal pain: Secondary | ICD-10-CM | POA: Insufficient documentation

## 2014-11-25 DIAGNOSIS — O9989 Other specified diseases and conditions complicating pregnancy, childbirth and the puerperium: Secondary | ICD-10-CM | POA: Diagnosis not present

## 2014-11-25 DIAGNOSIS — O26893 Other specified pregnancy related conditions, third trimester: Secondary | ICD-10-CM | POA: Insufficient documentation

## 2014-11-25 DIAGNOSIS — F329 Major depressive disorder, single episode, unspecified: Secondary | ICD-10-CM | POA: Insufficient documentation

## 2014-11-25 DIAGNOSIS — O26899 Other specified pregnancy related conditions, unspecified trimester: Secondary | ICD-10-CM

## 2014-11-25 DIAGNOSIS — Z3A29 29 weeks gestation of pregnancy: Secondary | ICD-10-CM | POA: Insufficient documentation

## 2014-11-25 DIAGNOSIS — Z7982 Long term (current) use of aspirin: Secondary | ICD-10-CM | POA: Diagnosis not present

## 2014-11-25 NOTE — Discharge Instructions (Signed)
Braxton Hicks Contractions °Contractions of the uterus can occur throughout pregnancy. Contractions are not always a sign that you are in labor.  °WHAT ARE BRAXTON HICKS CONTRACTIONS?  °Contractions that occur before labor are called Braxton Hicks contractions, or false labor. Toward the end of pregnancy (32-34 weeks), these contractions can develop more often and may become more forceful. This is not true labor because these contractions do not result in opening (dilatation) and thinning of the cervix. They are sometimes difficult to tell apart from true labor because these contractions can be forceful and people have different pain tolerances. You should not feel embarrassed if you go to the hospital with false labor. Sometimes, the only way to tell if you are in true labor is for your health care provider to look for changes in the cervix. °If there are no prenatal problems or other health problems associated with the pregnancy, it is completely safe to be sent home with false labor and await the onset of true labor. °HOW CAN YOU TELL THE DIFFERENCE BETWEEN TRUE AND FALSE LABOR? °False Labor °· The contractions of false labor are usually shorter and not as hard as those of true labor.   °· The contractions are usually irregular.   °· The contractions are often felt in the front of the lower abdomen and in the groin.   °· The contractions may go away when you walk around or change positions while lying down.   °· The contractions get weaker and are shorter lasting as time goes on.   °· The contractions do not usually become progressively stronger, regular, and closer together as with true labor.   °True Labor °· Contractions in true labor last 30-70 seconds, become very regular, usually become more intense, and increase in frequency.   °· The contractions do not go away with walking.   °· The discomfort is usually felt in the top of the uterus and spreads to the lower abdomen and low back.   °· True labor can be  determined by your health care provider with an exam. This will show that the cervix is dilating and getting thinner.   °WHAT TO REMEMBER °· Keep up with your usual exercises and follow other instructions given by your health care provider.   °· Take medicines as directed by your health care provider.   °· Keep your regular prenatal appointments.   °· Eat and drink lightly if you think you are going into labor.   °· If Braxton Hicks contractions are making you uncomfortable:   °¨ Change your position from lying down or resting to walking, or from walking to resting.   °¨ Sit and rest in a tub of warm water.   °¨ Drink 2-3 glasses of water. Dehydration may cause these contractions.   °¨ Do slow and deep breathing several times an hour.   °WHEN SHOULD I SEEK IMMEDIATE MEDICAL CARE? °Seek immediate medical care if: °· Your contractions become stronger, more regular, and closer together.   °· You have fluid leaking or gushing from your vagina.   °· You have a fever.   °· You pass blood-tinged mucus.   °· You have vaginal bleeding.   °· You have continuous abdominal pain.   °· You have low back pain that you never had before.   °· You feel your baby's head pushing down and causing pelvic pressure.   °· Your baby is not moving as much as it used to.   °Document Released: 03/21/2005 Document Revised: 03/26/2013 Document Reviewed: 12/31/2012 °Exit[employer]® Patient Information ©2015 [employer]. This information is not intended to replace advice given to you by your health care   provider. Make sure you discuss any questions you have with your health care provider. ° °

## 2014-11-25 NOTE — MAU Provider Note (Signed)
History     CSN: [id number]  Arrival date and time: 11/25/14 1555   First Provider Initiated Contact with Patient 11/25/14 1614      Chief Complaint  Patient presents with  . Contractions   HPI  Stephanie Mack 23 y.o. [id number] @[redacted]w[redacted]d  presents by EMS. Pt states she was contracting around 1200pm and called the ambulance but by the time she arrived here she was not contracting anymore. She denies vaginal bleeding, LOF and reports positive fetal movement.   Past Medical History  Diagnosis Date  . Depression     depression  . Bipolar disorder   . ADHD (attention deficit hyperactivity disorder)   . Anxiety   . Anemia   . ADHD (attention deficit hyperactivity disorder)   . Bipolar 1 disorder   . Chlamydia 05-31-10  . Pseudocyesis 2013    Seen in MAU for percieved FM, abd distension. Normal exam.   . Gonorrhea contact, treated   . Pregnancy induced hypertension   . Headache   . Infection     UTI  . Gestational diabetes     Past Surgical History  Procedure Laterality Date  . Nasal septum surgery    . Cesarean section N/A 09/01/2012    Procedure:  Primary cesarean section with delivery of baby girl at 77.  Apgars 1/1.  ;  Surgeon: Frederico Hamman, MD;  Location: [facility];  Service: Obstetrics;  Laterality: N/A;    Family History  Problem Relation Age of Onset  . Kidney disease Mother   . Hypertension Father   . Cancer Sister   . Asthma Brother   . Heart disease Neg Hx   . Diabetes Maternal Grandfather     Social History  Substance Use Topics  . Smoking status: Never Smoker   . Smokeless tobacco: Never Used  . Alcohol Use: No    Allergies: No Known Allergies  Prescriptions prior to admission  Medication Sig Dispense Refill Last Dose  . acetaminophen (TYLENOL) 325 MG tablet Take 325 mg by mouth every 6 (six) hours as needed for headache.   prn at Unknown time  . aspirin 81 MG tablet Take 81 mg by mouth daily.   11/22/2014 at Unknown time  . Blood Glucose  Monitoring Suppl (ACCU-CHEK AVIVA) device Patient to check blood sugar fasting and 2 hours after each meal. 1 each 0   . glucose blood (ACCU-CHEK AVIVA) test strip Patient to check blood sugar fasting and 2 hours after each meal 100 each 12   . Lancets (ACCU-CHEK MULTICLIX) lancets Patient to check blood sugar fasting and 2 hours after each meal. 100 each 12   . Prenat-FeFum-FePo-FA-Omega 3 (CONCEPT DHA) 53.5-38-1 MG CAPS Take 1 capsule by mouth daily. 30 capsule 11 11/22/2014 at Unknown time    Review of Systems  Constitutional: Negative for fever.  All other systems reviewed and are negative.  Physical Exam   Blood pressure 109/67, pulse 106, temperature 97.8 F (36.6 C), resp. rate 18, last menstrual period 04/30/2014.  Physical Exam  Nursing note and vitals reviewed. Constitutional: She is oriented to person, place, and time. She appears well-developed and well-nourished. No distress.  HENT:  Head: Normocephalic and atraumatic.  Neck: Normal range of motion.  Cardiovascular: Normal rate.   Respiratory: Effort normal. No respiratory distress.  GI: Soft. There is no tenderness.  Musculoskeletal: Normal range of motion.  Neurological: She is alert and oriented to person, place, and time.  Skin: Skin is warm and dry.  Psychiatric: She  has a normal mood and affect. Her behavior is normal. Judgment and thought content normal.     Closed/th/high MAU Course  Procedures  MDM Will use efm to monitor for contractions; fetal well being. FHT Category 1 ; Reassuring; No contractions  Assessment and Plan  Abdominal Pain in pregnancy  Discharge to home  Kurt G Vernon Md [facility] 11/25/2014, 4:29 PM

## 2014-11-25 NOTE — MAU Note (Signed)
Pt presents to MAU via EMS with complaints of contractions every 20 mins. Denies any vaginal bleeding or LOF

## 2014-11-26 ENCOUNTER — Ambulatory Visit (INDEPENDENT_AMBULATORY_CARE_PROVIDER_SITE_OTHER): Payer: Medicaid Other | Admitting: Certified Nurse Midwife

## 2014-11-26 VITALS — BP 118/78 | HR 111 | Temp 98.2°F | Wt 143.2 lb

## 2014-11-26 DIAGNOSIS — O24419 Gestational diabetes mellitus in pregnancy, unspecified control: Secondary | ICD-10-CM | POA: Diagnosis not present

## 2014-11-26 DIAGNOSIS — O0993 Supervision of high risk pregnancy, unspecified, third trimester: Secondary | ICD-10-CM

## 2014-11-26 LAB — POCT URINALYSIS DIPSTICK
BILIRUBIN UA: NEGATIVE
GLUCOSE UA: NEGATIVE
Ketones, UA: NEGATIVE
Leukocytes, UA: NEGATIVE
NITRITE UA: NEGATIVE
Protein, UA: NEGATIVE
RBC UA: NEGATIVE
SPEC GRAV UA: 1.01
Urobilinogen, UA: NEGATIVE
pH, UA: 7

## 2014-11-26 MED ORDER — METFORMIN HCL 500 MG PO TABS: 500.0000 mg | ORAL_TABLET | Freq: Two times a day (BID) | ORAL | Status: DC
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
## 2014-11-27 ENCOUNTER — Inpatient Hospital Stay (HOSPITAL_COMMUNITY)
Admission: AD | Admit: 2014-11-27 | Discharge: 2014-11-28 | Disposition: A | Discharge status: Home / Self Care | Payer: Medicaid Other | Source: Ambulatory Visit | Attending: Obstetrics | Admitting: Obstetrics

## 2014-11-27 ENCOUNTER — Encounter (HOSPITAL_COMMUNITY): Payer: Self-pay | Admitting: *Deleted

## 2014-11-27 DIAGNOSIS — Z3A30 30 weeks gestation of pregnancy: Secondary | ICD-10-CM | POA: Insufficient documentation

## 2014-11-27 DIAGNOSIS — Z7982 Long term (current) use of aspirin: Secondary | ICD-10-CM | POA: Diagnosis not present

## 2014-11-27 DIAGNOSIS — R12 Heartburn: Secondary | ICD-10-CM | POA: Insufficient documentation

## 2014-11-27 DIAGNOSIS — Z8249 Family history of ischemic heart disease and other diseases of the circulatory system: Secondary | ICD-10-CM | POA: Insufficient documentation

## 2014-11-27 DIAGNOSIS — O26893 Other specified pregnancy related conditions, third trimester: Secondary | ICD-10-CM | POA: Diagnosis not present

## 2014-11-27 DIAGNOSIS — O9989 Other specified diseases and conditions complicating pregnancy, childbirth and the puerperium: Secondary | ICD-10-CM | POA: Insufficient documentation

## 2014-11-27 DIAGNOSIS — R109 Unspecified abdominal pain: Secondary | ICD-10-CM | POA: Diagnosis present

## 2014-11-27 DIAGNOSIS — O26899 Other specified pregnancy related conditions, unspecified trimester: Secondary | ICD-10-CM

## 2014-11-27 LAB — URINALYSIS, ROUTINE W REFLEX MICROSCOPIC
Bilirubin Urine: NEGATIVE
Glucose, UA: NEGATIVE mg/dL
Ketones, ur: 15 mg/dL — AB
LEUKOCYTES UA: NEGATIVE
Nitrite: NEGATIVE
PROTEIN: 30 mg/dL — AB
UROBILINOGEN UA: 0.2 mg/dL (ref 0.0–1.0)
pH: 6 (ref 5.0–8.0)

## 2014-11-27 LAB — URINE MICROSCOPIC-ADD ON

## 2014-11-27 MED ORDER — GI COCKTAIL ~~LOC~~
30.0000 mL | Freq: Once | ORAL | Status: AC
  Administered 2014-11-27: 30 mL via ORAL
  Filled 2014-11-27: qty 30

## 2014-11-27 NOTE — MAU Note (Signed)
Pt states she started having lower abd cramping about 1 hour ago. Denies bleeding or ROM

## 2014-11-27 NOTE — MAU Provider Note (Signed)
History     CSN: [id number]  Arrival date and time: 11/27/14 2225   First Provider Initiated Contact with Patient 11/27/14 2322      Chief Complaint  Patient presents with  . Abdominal Cramping   HPI Comments: Stephanie Mack is a 24 y.o. G3P0110 at [redacted]w[redacted]d who presents today with cramping. She states that the cramping started around 2100, but it is better now. She denies any recent intercourse. She denies any VB or LOF. She states that the fetus has been active. She has not taken anything for the pain. She states that the heartburn makes it worse. She has zantac, but she has not taken it.   Abdominal Cramping This is a new problem. The current episode started today (around 2100 ). The onset quality is gradual. The problem occurs constantly. The problem has been resolved. The pain is located in the suprapubic region. The pain is at a severity of 6/10 (at it's worst. Pain is resloved now. ). The quality of the pain is cramping. Pertinent negatives include no constipation, diarrhea, dysuria, fever, frequency, nausea or vomiting. Exacerbated by: when she has heartburn the cramping is worse.  The pain is relieved by nothing. She has tried antacids for the symptoms. The treatment provided mild relief.    Past Medical History  Diagnosis Date  . Depression     depression  . Bipolar disorder   . ADHD (attention deficit hyperactivity disorder)   . Anxiety   . Anemia   . ADHD (attention deficit hyperactivity disorder)   . Bipolar 1 disorder   . Chlamydia 05-31-10  . Pseudocyesis 2013    Seen in MAU for percieved FM, abd distension. Normal exam.   . Gonorrhea contact, treated   . Pregnancy induced hypertension   . Headache   . Infection     UTI  . Gestational diabetes     Past Surgical History  Procedure Laterality Date  . Nasal septum surgery    . Cesarean section N/A 09/01/2012    Procedure:  Primary cesarean section with delivery of baby girl at 53.  Apgars 1/1.  ;  Surgeon:  Frederico Hamman, MD;  Location: [facility];  Service: Obstetrics;  Laterality: N/A;    Family History  Problem Relation Age of Onset  . Kidney disease Mother   . Hypertension Father   . Cancer Sister   . Asthma Brother   . Heart disease Neg Hx   . Diabetes Maternal Grandfather     Social History  Substance Use Topics  . Smoking status: Never Smoker   . Smokeless tobacco: Never Used  . Alcohol Use: No    Allergies: No Known Allergies  Prescriptions prior to admission  Medication Sig Dispense Refill Last Dose  . acetaminophen (TYLENOL) 325 MG tablet Take 325 mg by mouth every 6 (six) hours as needed for headache.   Taking  . aspirin 81 MG tablet Take 81 mg by mouth daily.   Taking  . Blood Glucose Monitoring Suppl (ACCU-CHEK AVIVA) device Patient to check blood sugar fasting and 2 hours after each meal. 1 each 0 Taking  . glucose blood (ACCU-CHEK AVIVA) test strip Patient to check blood sugar fasting and 2 hours after each meal 100 each 12 Taking  . Lancets (ACCU-CHEK MULTICLIX) lancets Patient to check blood sugar fasting and 2 hours after each meal. 100 each 12 Taking  . metFORMIN (GLUCOPHAGE) 500 MG tablet Take 1 tablet (500 mg total) by mouth 2 (two) times daily with  a meal. 60 tablet 5   . Prenat-FeFum-FePo-FA-Omega 3 (CONCEPT DHA) 53.5-38-1 MG CAPS Take 1 capsule by mouth daily. 30 capsule 11 Taking    Review of Systems  Constitutional: Negative for fever and chills.  Gastrointestinal: Positive for heartburn and abdominal pain. Negative for nausea, vomiting, diarrhea and constipation.  Genitourinary: Negative for dysuria, urgency and frequency.   Physical Exam   Blood pressure 123/73, pulse 95, temperature 98.2 F (36.8 C), temperature source Oral, resp. rate 16, height 5\' 1"  (1.549 m), weight 64.864 kg (143 lb), last menstrual period 04/30/2014, SpO2 98 %.  Physical Exam  Nursing note and vitals reviewed. Constitutional: She appears well-developed and well-nourished.  No distress.  HENT:  Head: Normocephalic.  Cardiovascular: Normal rate.   Respiratory: Effort normal.  GI: Soft. There is no tenderness. There is no rebound.  Genitourinary:  Cervix: closed/thick/high   Neurological: She is alert.  Skin: Skin is warm and dry.  Psychiatric: She has a normal mood and affect.  FHT: 145, moderate with 15x15 accels no decels Toco: no UCs   MAU Course  Procedures  MDM Patient has had GI cocktail. She reports feeling better.   Assessment and Plan   1. Heartburn during pregnancy in third trimester   2. Abdominal pain affecting pregnancy    DC home Comfort measures reviewed  3rd Trimester precautions  PTL precautions  Fetal kick counts RX: none Return to MAU as needed FU with OB as planned  Follow-up Information    Follow up with Shelly Bombard, MD.   Specialty:  Obstetrics and Gynecology   Why:  As scheduled   Contact information:   [address] [place] [address] [phone]         Mathis Bud 11/27/2014, 11:23 PM

## 2014-11-27 NOTE — Discharge Instructions (Signed)

## 2014-11-27 NOTE — MAU Note (Signed)
Pt presents to MAU with complaints of lower abdominal cramping all day. Denies any vaginal bleeding or LOF

## 2014-11-28 NOTE — Telephone Encounter (Signed)
error 

## 2014-11-30 ENCOUNTER — Encounter (HOSPITAL_COMMUNITY): Payer: Self-pay | Admitting: *Deleted

## 2014-11-30 ENCOUNTER — Inpatient Hospital Stay (HOSPITAL_COMMUNITY)
Admission: AD | Admit: 2014-11-30 | Discharge: 2014-12-01 | Disposition: A | Discharge status: Home / Self Care | Payer: Medicaid Other | Source: Ambulatory Visit | Attending: Obstetrics | Admitting: Obstetrics

## 2014-11-30 ENCOUNTER — Inpatient Hospital Stay (HOSPITAL_COMMUNITY): Payer: Medicaid Other

## 2014-11-30 DIAGNOSIS — R109 Unspecified abdominal pain: Secondary | ICD-10-CM | POA: Insufficient documentation

## 2014-11-30 DIAGNOSIS — O26899 Other specified pregnancy related conditions, unspecified trimester: Secondary | ICD-10-CM

## 2014-11-30 DIAGNOSIS — Z3A30 30 weeks gestation of pregnancy: Secondary | ICD-10-CM

## 2014-11-30 DIAGNOSIS — O26893 Other specified pregnancy related conditions, third trimester: Secondary | ICD-10-CM | POA: Insufficient documentation

## 2014-11-30 DIAGNOSIS — O4693 Antepartum hemorrhage, unspecified, third trimester: Secondary | ICD-10-CM | POA: Diagnosis not present

## 2014-11-30 DIAGNOSIS — F319 Bipolar disorder, unspecified: Secondary | ICD-10-CM | POA: Insufficient documentation

## 2014-11-30 DIAGNOSIS — O09293 Supervision of pregnancy with other poor reproductive or obstetric history, third trimester: Secondary | ICD-10-CM

## 2014-11-30 DIAGNOSIS — O360130 Maternal care for anti-D [Rh] antibodies, third trimester, not applicable or unspecified: Secondary | ICD-10-CM

## 2014-11-30 DIAGNOSIS — O9989 Other specified diseases and conditions complicating pregnancy, childbirth and the puerperium: Secondary | ICD-10-CM | POA: Diagnosis not present

## 2014-11-30 DIAGNOSIS — O34219 Maternal care for unspecified type scar from previous cesarean delivery: Secondary | ICD-10-CM

## 2014-11-30 DIAGNOSIS — F909 Attention-deficit hyperactivity disorder, unspecified type: Secondary | ICD-10-CM | POA: Diagnosis not present

## 2014-11-30 DIAGNOSIS — F329 Major depressive disorder, single episode, unspecified: Secondary | ICD-10-CM | POA: Insufficient documentation

## 2014-11-30 DIAGNOSIS — Z8751 Personal history of pre-term labor: Secondary | ICD-10-CM

## 2014-11-30 LAB — URINALYSIS, ROUTINE W REFLEX MICROSCOPIC
Bilirubin Urine: NEGATIVE
Glucose, UA: NEGATIVE mg/dL
Hgb urine dipstick: NEGATIVE
Ketones, ur: NEGATIVE mg/dL
Leukocytes, UA: NEGATIVE
Nitrite: NEGATIVE
PROTEIN: NEGATIVE mg/dL
Specific Gravity, Urine: 1.02 (ref 1.005–1.030)
UROBILINOGEN UA: 0.2 mg/dL (ref 0.0–1.0)
pH: 5.5 (ref 5.0–8.0)

## 2014-11-30 NOTE — MAU Note (Signed)
Pt states that she had a scant amount of spotting when she wiped after going to [facility]. Pt states that this happened x1 in the afternoon and has had nothing since. Pt also states that she is having some pain in lower abdomen that rates 8/10. Pt states that baby is active.

## 2014-11-30 NOTE — MAU Provider Note (Signed)
History   [id number]   Chief Complaint  Patient presents with  . Vaginal Bleeding    HPI Stephanie Mack is a 24 y.o. female  G3P0110 here with report of having a scant amount of spotting when she wiped after going to [facility] at 1900. No additional bleeding since.  Pt also states that she is having some pain in mid lower abdomen that is rated 8/10. Pain is described as a sharp pain.  No radiation.  Pain increases with laying down.  Pt states that baby is active.  Patient's last menstrual period was 04/30/2014.  OB History  Gravida Para Term Preterm AB SAB TAB Ectopic Multiple Living  3 1 0 1 1 1 0 0 0 0     # Outcome Date GA Lbr Len/2nd Weight Sex Delivery Anes PTL Lv  3 Current           2 Preterm 09/01/12 [redacted]w[redacted]d   F CS-LTranv Spinal  N  1 SAB 09/16/09 [redacted]w[redacted]d            Comments: Twin gestational sacs.       Past Medical History  Diagnosis Date  . Depression     depression  . Bipolar disorder   . ADHD (attention deficit hyperactivity disorder)   . Anxiety   . Anemia   . ADHD (attention deficit hyperactivity disorder)   . Bipolar 1 disorder   . Chlamydia 05-31-10  . Pseudocyesis 2013    Seen in MAU for percieved FM, abd distension. Normal exam.   . Gonorrhea contact, treated   . Pregnancy induced hypertension   . Headache   . Infection     UTI  . Gestational diabetes     Family History  Problem Relation Age of Onset  . Kidney disease Mother   . Hypertension Father   . Cancer Sister   . Asthma Brother   . Heart disease Neg Hx   . Diabetes Maternal Grandfather     Social History   Social History  . Marital Status: Single    Spouse Name: N/A  . Number of Children: N/A  . Years of Education: N/A   Social History Main Topics  . Smoking status: Never Smoker   . Smokeless tobacco: Never Used  . Alcohol Use: No  . Drug Use: No  . Sexual Activity:    Partners: Male    Birth Control/ Protection: None     Comment: 1 partner   Other Topics Concern  . None    Social History Narrative    No Known Allergies  No current facility-administered medications on file prior to encounter.   Current Outpatient Prescriptions on File Prior to Encounter  Medication Sig Dispense Refill  . acetaminophen (TYLENOL) 325 MG tablet Take 325-650 mg by mouth every 6 (six) hours as needed for mild pain or headache.     . metFORMIN (GLUCOPHAGE) 500 MG tablet Take 1 tablet (500 mg total) by mouth 2 (two) times daily with a meal. 60 tablet 5  . Prenat-FeFum-FePo-FA-Omega 3 (CONCEPT DHA) 53.5-38-1 MG CAPS Take 1 capsule by mouth daily. 30 capsule 11  . Blood Glucose Monitoring Suppl (ACCU-CHEK AVIVA) device Patient to check blood sugar fasting and 2 hours after each meal. 1 each 0  . glucose blood (ACCU-CHEK AVIVA) test strip Patient to check blood sugar fasting and 2 hours after each meal 100 each 12  . Lancets (ACCU-CHEK MULTICLIX) lancets Patient to check blood sugar fasting and 2 hours after each meal. 100 each  12     Review of Systems  Gastrointestinal: Negative for nausea and vomiting.  Genitourinary: Positive for vaginal bleeding and pelvic pain.  All other systems reviewed and are negative.    Physical Exam   Filed Vitals:   11/30/14 2211  BP: 124/75  Pulse: 83  Temp: 98.2 F (36.8 C)  TempSrc: Oral  Resp: 18    Physical Exam  Constitutional: She is oriented to person, place, and time. She appears well-developed and well-nourished. She appears distressed.  HENT:  Head: Normocephalic.  Neck: Normal range of motion. Neck supple.  Cardiovascular: Normal rate, regular rhythm and normal heart sounds.   Respiratory: Effort normal and breath sounds normal. No respiratory distress.  GI: Soft. There is tenderness (lower pelvic).  Genitourinary: No bleeding in the vagina. Vaginal discharge (mucusy) found.  Musculoskeletal: Normal range of motion. She exhibits no edema.  Neurological: She is alert and oriented to person, place, and time.  Skin: Skin  is warm and dry.   Dilation: Closed Exam by:: Reina Fuse CNM  FHR 130-140's, +accels Toco - irregular  MAU Course  Procedures  MDM Ultrasound: Preliminary report - normal exam, no signs of abruption; AFI 16.1  Results for orders placed or performed during the hospital encounter of 11/30/14 (from the past 24 hour(s))  Urinalysis, Routine w reflex microscopic (not at [facility])     Status: None   Collection Time: 11/30/14  9:55 PM  Result Value Ref Range   Color, Urine YELLOW YELLOW   APPearance CLEAR CLEAR   Specific Gravity, Urine 1.020 1.005 - 1.030   pH 5.5 5.0 - 8.0   Glucose, UA NEGATIVE NEGATIVE mg/dL   Hgb urine dipstick NEGATIVE NEGATIVE   Bilirubin Urine NEGATIVE NEGATIVE   Ketones, ur NEGATIVE NEGATIVE mg/dL   Protein, ur NEGATIVE NEGATIVE mg/dL   Urobilinogen, UA 0.2 0.0 - 1.0 mg/dL   Nitrite NEGATIVE NEGATIVE   Leukocytes, UA NEGATIVE NEGATIVE   0020 Consulted with Dr. Jodi Mourning > Reviewed HPI/Exam/ultrasound/OB history > discharge home with reassurance with follow-up as scheduled in office  Assessment and Plan  24 y.o. G3P0110 at [redacted]w[redacted]d IUP  Abdominal Pain in Pregnancy - normal exam Reactive NST  Plan: Discharge to home Pregnancy precautions Follow-up with Dr. Josph Macho, CNM 11/30/2014 12:25 AM

## 2014-12-01 DIAGNOSIS — R109 Unspecified abdominal pain: Secondary | ICD-10-CM

## 2014-12-01 DIAGNOSIS — O9989 Other specified diseases and conditions complicating pregnancy, childbirth and the puerperium: Secondary | ICD-10-CM | POA: Diagnosis not present

## 2014-12-01 NOTE — Discharge Instructions (Signed)

## 2014-12-02 ENCOUNTER — Telehealth: Payer: Self-pay | Admitting: *Deleted

## 2014-12-02 ENCOUNTER — Inpatient Hospital Stay (HOSPITAL_COMMUNITY)
Admission: AD | Admit: 2014-12-02 | Discharge: 2014-12-02 | Disposition: A | Discharge status: Home / Self Care | Payer: Medicaid Other | Source: Ambulatory Visit | Attending: Obstetrics | Admitting: Obstetrics

## 2014-12-02 ENCOUNTER — Encounter (HOSPITAL_COMMUNITY): Payer: Self-pay | Admitting: *Deleted

## 2014-12-02 DIAGNOSIS — Z3A31 31 weeks gestation of pregnancy: Secondary | ICD-10-CM | POA: Diagnosis not present

## 2014-12-02 DIAGNOSIS — O99013 Anemia complicating pregnancy, third trimester: Secondary | ICD-10-CM

## 2014-12-02 DIAGNOSIS — D649 Anemia, unspecified: Secondary | ICD-10-CM | POA: Diagnosis not present

## 2014-12-02 DIAGNOSIS — O219 Vomiting of pregnancy, unspecified: Secondary | ICD-10-CM | POA: Diagnosis not present

## 2014-12-02 DIAGNOSIS — R55 Syncope and collapse: Secondary | ICD-10-CM | POA: Insufficient documentation

## 2014-12-02 DIAGNOSIS — E876 Hypokalemia: Secondary | ICD-10-CM

## 2014-12-02 DIAGNOSIS — O212 Late vomiting of pregnancy: Secondary | ICD-10-CM | POA: Diagnosis not present

## 2014-12-02 LAB — COMPREHENSIVE METABOLIC PANEL
ALBUMIN: 2.6 g/dL — AB (ref 3.5–5.0)
ALK PHOS: 67 U/L (ref 38–126)
ALT: 12 U/L — AB (ref 14–54)
AST: 20 U/L (ref 15–41)
Anion gap: 7 (ref 5–15)
BILIRUBIN TOTAL: 0.4 mg/dL (ref 0.3–1.2)
CALCIUM: 8.4 mg/dL — AB (ref 8.9–10.3)
CO2: 20 mmol/L — AB (ref 22–32)
CREATININE: 0.42 mg/dL — AB (ref 0.44–1.00)
Chloride: 109 mmol/L (ref 101–111)
GFR calc Af Amer: >60 mL/min (ref >60)
GFR calc non Af Amer: >60 mL/min (ref >60)
GLUCOSE: 147 mg/dL — AB (ref 65–99)
Potassium: 3.1 mmol/L — ABNORMAL LOW (ref 3.5–5.1)
SODIUM: 136 mmol/L (ref 135–145)
TOTAL PROTEIN: 6 g/dL — AB (ref 6.5–8.1)

## 2014-12-02 LAB — URINALYSIS, ROUTINE W REFLEX MICROSCOPIC
Bilirubin Urine: NEGATIVE
GLUCOSE, UA: 250 mg/dL — AB
Hgb urine dipstick: NEGATIVE
KETONES UR: 15 mg/dL — AB
LEUKOCYTES UA: NEGATIVE
NITRITE: NEGATIVE
PH: 6.5 (ref 5.0–8.0)
PROTEIN: NEGATIVE mg/dL
Specific Gravity, Urine: 1.025 (ref 1.005–1.030)
Urobilinogen, UA: 0.2 mg/dL (ref 0.0–1.0)

## 2014-12-02 LAB — CBC
HEMATOCRIT: 30.5 % — AB (ref 36.0–46.0)
HEMOGLOBIN: 9.7 g/dL — AB (ref 12.0–15.0)
MCH: 24.9 pg — ABNORMAL LOW (ref 26.0–34.0)
MCHC: 31.8 g/dL (ref 30.0–36.0)
MCV: 78.4 fL (ref 78.0–100.0)
Platelets: 166 10*3/uL (ref 150–400)
RBC: 3.89 MIL/uL (ref 3.87–5.11)
RDW: 14.8 % (ref 11.5–15.5)
WBC: 7.3 10*3/uL (ref 4.0–10.5)

## 2014-12-02 MED ORDER — LACTATED RINGERS IV BOLUS (SEPSIS)
1000.0000 mL | Freq: Once | INTRAVENOUS | Status: AC
  Administered 2014-12-02: 1000 mL via INTRAVENOUS

## 2014-12-02 MED ORDER — POTASSIUM CHLORIDE CRYS ER 20 MEQ PO TBCR: 20.0000 meq | EXTENDED_RELEASE_TABLET | Freq: Two times a day (BID) | ORAL | Status: DC

## 2014-12-02 MED ORDER — PROMETHAZINE HCL 25 MG/ML IJ SOLN
25.0000 mg | Freq: Once | INTRAMUSCULAR | Status: AC
  Administered 2014-12-02: 25 mg via INTRAVENOUS
  Filled 2014-12-02: qty 1

## 2014-12-02 MED ORDER — FERROUS SULFATE 325 (65 FE) MG PO TABS: 325.0000 mg | ORAL_TABLET | Freq: Two times a day (BID) | ORAL | Status: DC

## 2014-12-02 MED ORDER — PROMETHAZINE HCL 25 MG PO TABS: 12.5000 mg | ORAL_TABLET | Freq: Four times a day (QID) | ORAL | Status: DC | PRN

## 2014-12-02 NOTE — MAU Note (Signed)
E signature not working in room 3. Patient signed paper copy.

## 2014-12-02 NOTE — Telephone Encounter (Signed)
Patient called Stephanie Mack- patient stated she was having shortness of breathe and abdominal pain. She did not have transportation to MAU and she had been sick since last night. Patient was crying and when asked if she needed help she said she did. Told patient I was going to call EMS for her- verified the address and called 911.

## 2014-12-02 NOTE — Discharge Instructions (Signed)
Nausea/Vomiting in Pregnancy Morning sickness is when you feel sick to your stomach (nauseous) during pregnancy. This nauseous feeling may or may not come with vomiting. It often occurs in the morning but can be a problem any time of day. Morning sickness is most common during the first trimester, but it may continue throughout pregnancy. While morning sickness is unpleasant, it is usually harmless unless you develop severe and continual vomiting (hyperemesis gravidarum). This condition requires more intense treatment.  CAUSES  The cause of morning sickness is not completely known but seems to be related to normal hormonal changes that occur in pregnancy. RISK FACTORS You are at greater risk if you:  Experienced nausea or vomiting before your pregnancy.  Had morning sickness during a previous pregnancy.  Are pregnant with more than one baby, such as twins. TREATMENT  Do not use any medicines (prescription, over-the-counter, or herbal) for morning sickness without first talking to your health care provider. Your health care provider may prescribe or recommend:  Vitamin B6 supplements.  Anti-nausea medicines.  The herbal medicine ginger. HOME CARE INSTRUCTIONS   Only take over-the-counter or prescription medicines as directed by your health care provider.  Taking multivitamins before getting pregnant can prevent or decrease the severity of morning sickness in most women.  Eat a piece of dry toast or unsalted crackers before getting out of bed in the morning.  Eat five or six small meals a day.  Eat dry and bland foods (rice, baked potato). Foods high in carbohydrates are often helpful.  Do not drink liquids with your meals. Drink liquids between meals.  Avoid greasy, fatty, and spicy foods.  Get someone to cook for you if the smell of any food causes nausea and vomiting.  If you feel nauseous after taking prenatal vitamins, take the vitamins at night or with a snack.  Snack on  protein foods (nuts, yogurt, cheese) between meals if you are hungry.  Eat unsweetened gelatins for desserts.  Wearing an acupressure wristband (worn for sea sickness) may be helpful.  Acupuncture may be helpful.  Do not smoke.  Get a humidifier to keep the air in your house free of odors.  Get plenty of fresh air. SEEK MEDICAL CARE IF:   Your home remedies are not working, and you need medicine.  You feel dizzy or lightheaded.  You are losing weight. SEEK IMMEDIATE MEDICAL CARE IF:   You have persistent and uncontrolled nausea and vomiting.  You pass out (faint). MAKE SURE YOU:  Understand these instructions.  Will watch your condition.  Will get help right away if you are not doing well or get worse. Document Released: 05/12/2006 Document Revised: 03/26/2013 Document Reviewed: 09/05/2012 [facility] Patient Information 2015 [place], [place]. This information is not intended to replace advice given to you by your health care provider. Make sure you discuss any questions you have with your health care provider.  Pregnancy and Anemia Anemia is a condition in which the concentration of red blood cells or hemoglobin in the blood is below normal. Hemoglobin is a substance in red blood cells that carries oxygen to the tissues of the body. Anemia results in not enough oxygen reaching these tissues.  Anemia during pregnancy is common because the fetus uses more iron and folic acid as it is developing. Your body may not produce enough red blood cells because of this. Also, during pregnancy, the liquid part of the blood (plasma) increases by about 50%, and the red blood cells increase by only 25%.  This lowers the concentration of the red blood cells and creates a natural anemia-like situation.  CAUSES  The most common cause of anemia during pregnancy is not having enough iron in the body to make red blood cells (iron deficiency anemia). Other causes may include:  Folic acid  deficiency.  Vitamin B12 deficiency.  Certain prescription or over-the-counter medicines.  Certain medical conditions or infections that destroy red blood cells.  A low platelet count and bleeding caused by antibodies that go through the placenta to the fetus from the mother's blood. SIGNS AND SYMPTOMS  Mild anemia may not be noticeable. If it becomes severe, symptoms may include:  Tiredness.  Shortness of breath, especially with exercise.  Weakness.  Fainting.  Pale looking skin.  Headaches.  Feeling a fast or irregular heartbeat (palpitations). DIAGNOSIS  The type of anemia is usually diagnosed from your family and medical history and blood tests. TREATMENT  Treatment of anemia during pregnancy depends on the cause of the anemia. Treatment can include:  Supplements of iron, vitamin Z12, or folic acid.  A blood transfusion. This may be needed if blood loss is severe.  Hospitalization. This may be needed if there is significant continual blood loss.  Dietary changes. HOME CARE INSTRUCTIONS   Follow your dietitian's or health care provider's dietary recommendations.  Increase your vitamin C intake. This will help the stomach absorb more iron.  Eat a diet rich in iron. This would include foods such as:  Liver.  Beef.  Whole grain bread.  Eggs.  Dried fruit.  Take iron and vitamins as directed by your health care provider.  Eat green leafy vegetables. These are a good source of folic acid. SEEK MEDICAL CARE IF:   You have frequent or lasting headaches.  You are looking pale.  You are bruising easily. SEEK IMMEDIATE MEDICAL CARE IF:   You have extreme weakness, shortness of breath, or chest pain.  You become dizzy or have trouble concentrating.  You have heavy vaginal bleeding.  You develop a rash.  You have bloody or black, tarry stools.  You faint.  You vomit up blood.  You vomit repeatedly.  You have abdominal pain.  You have a fever  or persistent symptoms for more than 2-3 days.  You have a fever and your symptoms suddenly get worse.  You are dehydrated. MAKE SURE YOU:   Understand these instructions.  Will watch your condition.  Will get help right away if you are not doing well or get worse. Document Released: 03/18/2000 Document Revised: 01/09/2013 Document Reviewed: 10/31/2012 [facility] Patient Information 2015 [place], [place]. This information is not intended to replace advice given to you by your health care provider. Make sure you discuss any questions you have with your health care provider.

## 2014-12-02 NOTE — MAU Provider Note (Signed)
Chief Complaint:  Emesis During Pregnancy and Loss of Consciousness   First Provider Initiated Contact with Patient 12/02/14 1737      HPI: Stephanie Mack is a 24 y.o. G3P0110 at 19w6dwho presents to maternity admissions reporting nausea with vomiting x 1 in last 24 hours. She report she has not eaten anything in 24 hours and has not kept down fluids very well.  She reports she was at home a couple of hours ago and felt dizzy, and slid down to the floor but did not hit the ground. She does not believe she completely lost consciousness. She was recently diagnosed with GDM but has been unable to pick up her prescribed medication.  She also reports sharp abdominal pains with movement that have been ongoing for several weeks. She reports good fetal movement, denies LOF, vaginal bleeding, vaginal itching/burning, urinary symptoms, h/a, dizziness, n/v, or fever/chills.    Loss of Consciousness This is a new problem. The current episode started today. There was no loss of consciousness. The symptoms are aggravated by standing. Associated symptoms include abdominal pain, light-headedness, nausea and vomiting. Pertinent negatives include no chest pain, dizziness, fever, headaches, vertigo, visual change or weakness. She has tried nothing for the symptoms.  Abdominal Pain This is a recurrent problem. The current episode started more than 1 month ago. The onset quality is gradual. The problem occurs intermittently. The problem has been waxing and waning. The pain is located in the LLQ and RLQ. The pain is moderate. The quality of the pain is sharp. The abdominal pain does not radiate. Associated symptoms include nausea and vomiting. Pertinent negatives include no constipation, diarrhea, dysuria, fever, frequency or headaches.    Past Medical History: Past Medical History  Diagnosis Date  . Depression     depression  . Bipolar disorder   . ADHD (attention deficit hyperactivity disorder)   . Anxiety   .  Anemia   . ADHD (attention deficit hyperactivity disorder)   . Bipolar 1 disorder   . Chlamydia 05-31-10  . Pseudocyesis 2013    Seen in MAU for percieved FM, abd distension. Normal exam.   . Gonorrhea contact, treated   . Pregnancy induced hypertension   . Headache   . Infection     UTI  . Gestational diabetes     Past obstetric history: OB History  Gravida Para Term Preterm AB SAB TAB Ectopic Multiple Living  3 1 0 1 1 1 0 0 0 0     # Outcome Date GA Lbr Len/2nd Weight Sex Delivery Anes PTL Lv  3 Current           2 Preterm 09/01/12 [redacted]w[redacted]d   F CS-LTranv Spinal  N  1 SAB 09/16/09 [redacted]w[redacted]d            Comments: Twin gestational sacs.       Past Surgical History: Past Surgical History  Procedure Laterality Date  . Nasal septum surgery    . Cesarean section N/A 09/01/2012    Procedure:  Primary cesarean section with delivery of baby girl at 57.  Apgars 1/1.  ;  Surgeon: Frederico Hamman, MD;  Location: [facility];  Service: Obstetrics;  Laterality: N/A;    Family History: Family History  Problem Relation Age of Onset  . Kidney disease Mother   . Hypertension Father   . Cancer Sister   . Asthma Brother   . Heart disease Neg Hx   . Diabetes Maternal Grandfather     Social History: Social  History  Substance Use Topics  . Smoking status: Never Smoker   . Smokeless tobacco: Never Used  . Alcohol Use: No    Allergies: No Known Allergies  Meds:  No prescriptions prior to admission    ROS:  Review of Systems  Constitutional: Negative for fever, chills and fatigue.  HENT: Negative for sinus pressure.   Eyes: Negative for photophobia.  Respiratory: Negative for shortness of breath.   Cardiovascular: Positive for syncope. Negative for chest pain.  Gastrointestinal: Positive for nausea, vomiting and abdominal pain. Negative for diarrhea and constipation.  Genitourinary: Negative for dysuria, frequency, flank pain, vaginal bleeding, vaginal discharge, difficulty  urinating, vaginal pain and pelvic pain.  Musculoskeletal: Negative for neck pain.  Neurological: Positive for light-headedness. Negative for dizziness, vertigo, weakness and headaches.  Psychiatric/Behavioral: Negative.      I have reviewed patient's Past Medical Hx, Surgical Hx, Family Hx, Social Hx, medications and allergies.   Physical Exam  No data found.   Constitutional: Well-developed, well-nourished female in no acute distress.  Cardiovascular: normal rate Respiratory: normal effort GI: Abd soft, non-tender, gravid appropriate for gestational age.  MS: Extremities nontender, no edema, normal ROM Neurologic: Alert and oriented x 4.  GU: Neg CVAT.  PELVIC EXAM: Deferred    FHT:  Baseline 150 , moderate variability, accelerations present, no decelerations Contractions: rare, mild to palpation   Labs: No results found for this or any previous visit (from the past 24 hour(s)). --/--/B NEG (08/10 1510) Results for orders placed or performed in visit on 12/05/14 (from the past 168 hour(s))  POCT Glucose (CBG)   Collection Time: 12/05/14 11:42 AM  Result Value Ref Range   POC Glucose 100 (A) 70 - 99 mg/dl  POCT urinalysis dipstick   Collection Time: 12/05/14  2:08 PM  Result Value Ref Range   Color, UA yellow    Clarity, UA cld    Glucose, UA normal    Bilirubin, UA neg    Ketones, UA neg    Spec Grav, UA 1.020    Blood, UA neg    pH, UA 6.0    Protein, UA trace    Urobilinogen, UA negative    Nitrite, UA neg    Leukocytes, UA Negative Negative  Results for orders placed or performed during the hospital encounter of 12/02/14 (from the past 168 hour(s))  Urinalysis, Routine w reflex microscopic (not at [facility])   Collection Time: 12/02/14  4:52 PM  Result Value Ref Range   Color, Urine YELLOW YELLOW   APPearance HAZY (A) CLEAR   Specific Gravity, Urine 1.025 1.005 - 1.030   pH 6.5 5.0 - 8.0   Glucose, UA 250 (A) NEGATIVE mg/dL   Hgb urine dipstick NEGATIVE  NEGATIVE   Bilirubin Urine NEGATIVE NEGATIVE   Ketones, ur 15 (A) NEGATIVE mg/dL   Protein, ur NEGATIVE NEGATIVE mg/dL   Urobilinogen, UA 0.2 0.0 - 1.0 mg/dL   Nitrite NEGATIVE NEGATIVE   Leukocytes, UA NEGATIVE NEGATIVE  CBC   Collection Time: 12/02/14  5:54 PM  Result Value Ref Range   WBC 7.3 4.0 - 10.5 K/uL   RBC 3.89 3.87 - 5.11 MIL/uL   Hemoglobin 9.7 (L) 12.0 - 15.0 g/dL   HCT 30.5 (L) 36.0 - 46.0 %   MCV 78.4 78.0 - 100.0 fL   MCH 24.9 (L) 26.0 - 34.0 pg   MCHC 31.8 30.0 - 36.0 g/dL   RDW 14.8 11.5 - 15.5 %   Platelets 166 150 - 400  K/uL  Comprehensive metabolic panel   Collection Time: 12/02/14  5:54 PM  Result Value Ref Range   Sodium 136 135 - 145 mmol/L   Potassium 3.1 (L) 3.5 - 5.1 mmol/L   Chloride 109 101 - 111 mmol/L   CO2 20 (L) 22 - 32 mmol/L   Glucose, Bld 147 (H) 65 - 99 mg/dL   BUN <5 (L) 6 - 20 mg/dL   Creatinine, Ser 0.42 (L) 0.44 - 1.00 mg/dL   Calcium 8.4 (L) 8.9 - 10.3 mg/dL   Total Protein 6.0 (L) 6.5 - 8.1 g/dL   Albumin 2.6 (L) 3.5 - 5.0 g/dL   AST 20 15 - 41 U/L   ALT 12 (L) 14 - 54 U/L   Alkaline Phosphatase 67 38 - 126 U/L   Total Bilirubin 0.4 0.3 - 1.2 mg/dL   GFR calc non Af Amer >60 >60 mL/min   GFR calc Af Amer >60 >60 mL/min   Anion gap 7 5 - 15  Results for orders placed or performed during the hospital encounter of 11/30/14 (from the past 168 hour(s))  Urinalysis, Routine w reflex microscopic (not at [facility])   Collection Time: 11/30/14  9:55 PM  Result Value Ref Range   Color, Urine YELLOW YELLOW   APPearance CLEAR CLEAR   Specific Gravity, Urine 1.020 1.005 - 1.030   pH 5.5 5.0 - 8.0   Glucose, UA NEGATIVE NEGATIVE mg/dL   Hgb urine dipstick NEGATIVE NEGATIVE   Bilirubin Urine NEGATIVE NEGATIVE   Ketones, ur NEGATIVE NEGATIVE mg/dL   Protein, ur NEGATIVE NEGATIVE mg/dL   Urobilinogen, UA 0.2 0.0 - 1.0 mg/dL   Nitrite NEGATIVE NEGATIVE   Leukocytes, UA NEGATIVE NEGATIVE   MAU Course/MDM: I have ordered labs and reviewed  results.  Treatments in MAU included LR x 1000 ml and Phenergan 25 mg IV with improvement in symptoms.  Pt stable at time of discharge.  Assessment: 1. Nausea/vomiting in pregnancy   2. Near syncope   3. Anemia affecting pregnancy in third trimester, antepartum   4. Hypokalemia     Plan: Discharge home Phenergan 12.5-25 mg PO Q 6 hours PRN K-Dur 20 meqs  BID x 14 days F/U with Dr Jodi Mourning Return to MAU as needed for emergencies      Follow-up Information    Follow up with HARPER,CHARLES A, MD.   Specialty:  Obstetrics and Gynecology   Why:  As scheduled   Contact information:   [address] [place] [address] [phone]       Follow up with [facility].   Why:  As needed for emergencies   Contact information:   [address] [id number] [place] [phone]       Medication List    TAKE these medications        ACCU-CHEK AVIVA device  Patient to check blood sugar fasting and 2 hours after each meal.     accu-chek multiclix lancets  Patient to check blood sugar fasting and 2 hours after each meal.     aspirin EC 81 MG tablet  Take 81 mg by mouth daily.     CONCEPT DHA 53.5-38-1 MG Caps  Take 1 capsule by mouth daily.     ferrous sulfate 325 (65 FE) MG tablet  Commonly known as:  FERROUSUL  Take 1 tablet (325 mg total) by mouth 2 (two) times daily.     glucose blood test strip  Commonly known as:  Comern­o  Patient to check blood sugar fasting and 2 hours after each meal     metFORMIN 500 MG tablet  Commonly known as:  GLUCOPHAGE  Take 1 tablet (500 mg total) by mouth 2 (two) times daily with a meal.     potassium chloride SA 20 MEQ tablet  Commonly known as:  K-DUR,KLOR-CON  Take 1 tablet (20 mEq total) by mouth 2 (two) times daily.     promethazine 25 MG tablet  Commonly known as:  PHENERGAN  Take 0.5-1 tablets (12.5-25 mg total) by mouth  every 6 (six) hours as needed for nausea or vomiting.        Fatima Blank Certified Nurse-Midwife 12/05/2014 3:57 PM

## 2014-12-02 NOTE — MAU Note (Addendum)
Pt states at 1545 went to BR and vomited. When got up off toilet passed out in hallway. States she did not hit abdomen or head but just went down on floor in hall. Denies bleeding or LOF. Good FM. Pt states has not eaten today but has been drinking flds. CBG per EMS 146. Pt is on carb modified diet and cking CBGs at home. States will find out Friday if is gest diabetic

## 2014-12-05 ENCOUNTER — Ambulatory Visit (INDEPENDENT_AMBULATORY_CARE_PROVIDER_SITE_OTHER): Payer: Medicaid Other | Admitting: Obstetrics

## 2014-12-05 ENCOUNTER — Encounter: Payer: Self-pay | Admitting: Obstetrics

## 2014-12-05 ENCOUNTER — Encounter: Payer: Medicaid Other | Admitting: Certified Nurse Midwife

## 2014-12-05 VITALS — BP 117/81 | HR 92 | Temp 98.0°F | Wt 143.3 lb

## 2014-12-05 DIAGNOSIS — K219 Gastro-esophageal reflux disease without esophagitis: Secondary | ICD-10-CM

## 2014-12-05 DIAGNOSIS — Z3483 Encounter for supervision of other normal pregnancy, third trimester: Secondary | ICD-10-CM

## 2014-12-05 DIAGNOSIS — O24410 Gestational diabetes mellitus in pregnancy, diet controlled: Secondary | ICD-10-CM | POA: Diagnosis not present

## 2014-12-05 DIAGNOSIS — O0993 Supervision of high risk pregnancy, unspecified, third trimester: Secondary | ICD-10-CM

## 2014-12-05 DIAGNOSIS — J302 Other seasonal allergic rhinitis: Secondary | ICD-10-CM

## 2014-12-05 LAB — GLUCOSE, POCT (MANUAL RESULT ENTRY): POC Glucose: 100 mg/dl — AB (ref 70–99)

## 2014-12-05 LAB — POCT URINALYSIS DIPSTICK
BILIRUBIN UA: NEGATIVE
Blood, UA: NEGATIVE
GLUCOSE UA: NORMAL
KETONES UA: NEGATIVE
LEUKOCYTES UA: NEGATIVE
NITRITE UA: NEGATIVE
Spec Grav, UA: 1.02
Urobilinogen, UA: NEGATIVE
pH, UA: 6

## 2014-12-05 MED ORDER — RANITIDINE HCL 150 MG PO TABS: 150.0000 mg | ORAL_TABLET | Freq: Two times a day (BID) | ORAL | Status: DC

## 2014-12-05 MED ORDER — LORATADINE 10 MG PO TABS: 10.0000 mg | ORAL_TABLET | Freq: Every day | ORAL | Status: DC

## 2014-12-05 NOTE — Addendum Note (Signed)
Addended by: Willia Craze on: 12/05/2014 02:08 PM   Modules accepted: Orders
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
## 2014-12-09 ENCOUNTER — Ambulatory Visit (HOSPITAL_COMMUNITY)
Admission: RE | Admit: 2014-12-09 | Discharge: 2014-12-09 | Disposition: A | Discharge status: Home / Self Care | Payer: Medicaid Other | Source: Ambulatory Visit | Attending: Certified Nurse Midwife | Admitting: Certified Nurse Midwife

## 2014-12-09 ENCOUNTER — Encounter (HOSPITAL_COMMUNITY): Payer: Self-pay

## 2014-12-09 DIAGNOSIS — O09213 Supervision of pregnancy with history of pre-term labor, third trimester: Secondary | ICD-10-CM | POA: Diagnosis not present

## 2014-12-09 DIAGNOSIS — O24424 Gestational diabetes mellitus in childbirth, insulin controlled: Secondary | ICD-10-CM | POA: Insufficient documentation

## 2014-12-09 DIAGNOSIS — Z3A31 31 weeks gestation of pregnancy: Secondary | ICD-10-CM | POA: Diagnosis not present

## 2014-12-09 DIAGNOSIS — O3421 Maternal care for scar from previous cesarean delivery: Secondary | ICD-10-CM | POA: Diagnosis not present

## 2014-12-09 DIAGNOSIS — O360130 Maternal care for anti-D [Rh] antibodies, third trimester, not applicable or unspecified: Secondary | ICD-10-CM | POA: Diagnosis not present

## 2014-12-09 DIAGNOSIS — O09293 Supervision of pregnancy with other poor reproductive or obstetric history, third trimester: Secondary | ICD-10-CM | POA: Insufficient documentation

## 2014-12-11 ENCOUNTER — Other Ambulatory Visit: Payer: Self-pay | Admitting: Certified Nurse Midwife

## 2014-12-12 ENCOUNTER — Telehealth: Payer: Self-pay | Admitting: *Deleted

## 2014-12-12 NOTE — Telephone Encounter (Signed)
Patient states she needs a refill on her prenatal vitamins. Patient advised to contact her pharmacy because her records show she should have multiple refills at the pharmacy on her prenatal vitamins. Patient verbalized understanding.

## 2014-12-13 ENCOUNTER — Inpatient Hospital Stay (HOSPITAL_COMMUNITY)
Admission: AD | Admit: 2014-12-13 | Discharge: 2014-12-13 | Disposition: A | Discharge status: Home / Self Care | Payer: Medicaid Other | Source: Ambulatory Visit | Attending: Obstetrics | Admitting: Obstetrics

## 2014-12-13 ENCOUNTER — Encounter (HOSPITAL_COMMUNITY): Payer: Self-pay | Admitting: *Deleted

## 2014-12-13 DIAGNOSIS — R109 Unspecified abdominal pain: Secondary | ICD-10-CM | POA: Diagnosis not present

## 2014-12-13 DIAGNOSIS — O24419 Gestational diabetes mellitus in pregnancy, unspecified control: Secondary | ICD-10-CM | POA: Insufficient documentation

## 2014-12-13 DIAGNOSIS — F329 Major depressive disorder, single episode, unspecified: Secondary | ICD-10-CM | POA: Insufficient documentation

## 2014-12-13 DIAGNOSIS — Z3A32 32 weeks gestation of pregnancy: Secondary | ICD-10-CM | POA: Diagnosis not present

## 2014-12-13 DIAGNOSIS — O26899 Other specified pregnancy related conditions, unspecified trimester: Secondary | ICD-10-CM

## 2014-12-13 DIAGNOSIS — O9989 Other specified diseases and conditions complicating pregnancy, childbirth and the puerperium: Secondary | ICD-10-CM | POA: Diagnosis not present

## 2014-12-13 LAB — URINALYSIS, ROUTINE W REFLEX MICROSCOPIC
Bilirubin Urine: NEGATIVE
Glucose, UA: NEGATIVE mg/dL
Ketones, ur: NEGATIVE mg/dL
LEUKOCYTES UA: NEGATIVE
NITRITE: NEGATIVE
Protein, ur: NEGATIVE mg/dL
SPECIFIC GRAVITY, URINE: 1.025 (ref 1.005–1.030)
UROBILINOGEN UA: 0.2 mg/dL (ref 0.0–1.0)
pH: 6 (ref 5.0–8.0)

## 2014-12-13 LAB — URINE MICROSCOPIC-ADD ON

## 2014-12-13 LAB — FETAL FIBRONECTIN: FETAL FIBRONECTIN: NEGATIVE

## 2014-12-13 LAB — GLUCOSE, CAPILLARY: GLUCOSE-CAPILLARY: 87 mg/dL (ref 65–99)

## 2014-12-13 MED ORDER — LACTATED RINGERS IV BOLUS (SEPSIS)
1000.0000 mL | Freq: Once | INTRAVENOUS | Status: AC
  Administered 2014-12-13: 1000 mL via INTRAVENOUS

## 2014-12-13 MED ORDER — DM-GUAIFENESIN ER 30-600 MG PO TB12
1.0000 | ORAL_TABLET | Freq: Two times a day (BID) | ORAL | Status: DC
  Administered 2014-12-13: 1 via ORAL
  Filled 2014-12-13 (×3): qty 1

## 2014-12-13 MED ORDER — DM-GUAIFENESIN ER 30-600 MG PO TB12
1.0000 | ORAL_TABLET | Freq: Two times a day (BID) | ORAL | Status: DC
  Filled 2014-12-13 (×2): qty 1

## 2014-12-13 NOTE — MAU Note (Signed)
Started having contractions last night, but unable to get to hospital until now.  No leaking of fluid or vaginal bleeding

## 2014-12-13 NOTE — MAU Provider Note (Signed)
History     CSN: [id number]  Arrival date and time: 12/13/14 1214   None     Chief Complaint  Patient presents with  . Contractions   HPI Pt is G3P0110 at [redacted]w[redacted]d pregnant and presents with contractions since last night- occuring every 6 to 7 minutes. Pt has felt good FM and denies vaginal bleeding or spotting, or UTI sx. Pt is Gestational diabetic in good control. Pt is scheduled for repeat C/S @ 36 weeks- pt has hx of neonatal demise  Has appointment to be seen in office this week. Pt states she is depressed but is not taking any medications- grandmother said they didn't know what was safe. Pt is not in counseling at this time.  Pt states she is not suicidal. RN note: Started having contractions last night, but unable to get to hospital until now. No leaking of fluid or vaginal bleeding Past Medical History  Diagnosis Date  . Depression     depression  . Bipolar disorder   . ADHD (attention deficit hyperactivity disorder)   . Anxiety   . Anemia   . ADHD (attention deficit hyperactivity disorder)   . Bipolar 1 disorder   . Chlamydia 05-31-10  . Pseudocyesis 2013    Seen in MAU for percieved FM, abd distension. Normal exam.   . Gonorrhea contact, treated   . Pregnancy induced hypertension   . Headache   . Infection     UTI  . Gestational diabetes     Past Surgical History  Procedure Laterality Date  . Nasal septum surgery    . Cesarean section N/A 09/01/2012    Procedure:  Primary cesarean section with delivery of baby girl at 16.  Apgars 1/1.  ;  Surgeon: Frederico Hamman, MD;  Location: [facility];  Service: Obstetrics;  Laterality: N/A;    Family History  Problem Relation Age of Onset  . Kidney disease Mother   . Hypertension Father   . Cancer Sister   . Asthma Brother   . Heart disease Neg Hx   . Diabetes Maternal Grandfather     Social History  Substance Use Topics  . Smoking status: Never Smoker   . Smokeless tobacco: Never Used  . Alcohol Use: No     Allergies: No Known Allergies  Prescriptions prior to admission  Medication Sig Dispense Refill Last Dose  . aspirin EC 81 MG tablet Take 81 mg by mouth daily.   Taking  . Blood Glucose Monitoring Suppl (ACCU-CHEK AVIVA) device Patient to check blood sugar fasting and 2 hours after each meal. 1 each 0 Taking  . ferrous sulfate (FERROUSUL) 325 (65 FE) MG tablet Take 1 tablet (325 mg total) by mouth 2 (two) times daily. 60 tablet 1 Taking  . glucose blood (ACCU-CHEK AVIVA) test strip Patient to check blood sugar fasting and 2 hours after each meal 100 each 12 Taking  . Lancets (ACCU-CHEK MULTICLIX) lancets Patient to check blood sugar fasting and 2 hours after each meal. 100 each 12 Taking  . loratadine (CLARITIN) 10 MG tablet Take 1 tablet (10 mg total) by mouth daily. 30 tablet 11 Taking  . metFORMIN (GLUCOPHAGE) 500 MG tablet Take 1 tablet (500 mg total) by mouth 2 (two) times daily with a meal. 60 tablet 5 Taking  . potassium chloride SA (K-DUR,KLOR-CON) 20 MEQ tablet Take 1 tablet (20 mEq total) by mouth 2 (two) times daily. 14 tablet 0 Taking  . Prenat-FeFum-FePo-FA-Omega 3 (CONCEPT DHA) 53.5-38-1 MG CAPS Take 1  capsule by mouth daily. 30 capsule 11 Taking  . promethazine (PHENERGAN) 25 MG tablet Take 0.5-1 tablets (12.5-25 mg total) by mouth every 6 (six) hours as needed for nausea or vomiting. 30 tablet 0 Taking  . ranitidine (ZANTAC) 150 MG tablet Take 1 tablet (150 mg total) by mouth 2 (two) times daily. 60 tablet 11 Taking    Review of Systems  Constitutional: Negative for fever and chills.  Eyes: Negative for blurred vision.  Gastrointestinal: Positive for abdominal pain and constipation. Negative for nausea, vomiting and diarrhea.       Last bowel movement yesterday- hard  Genitourinary: Negative for dysuria and urgency.  Musculoskeletal: Positive for back pain.       Comes and goes  Neurological: Positive for headaches. Negative for dizziness.  Psychiatric/Behavioral:  Positive for depression.   Physical Exam   Blood pressure 124/74, pulse 113, temperature 98.7 F (37.1 C), temperature source Oral, resp. rate 18, height 5\' 1"  (1.549 m), weight 143 lb (64.864 kg), last menstrual period 04/30/2014.  Physical Exam  Nursing note and vitals reviewed. Constitutional: She is oriented to person, place, and time. She appears well-developed and well-nourished. No distress.  HENT:  Head: Normocephalic.  Eyes: Pupils are equal, round, and reactive to light.  Neck: Normal range of motion. Neck supple.  Cardiovascular: Normal rate.   Respiratory: Effort normal.  GI: Soft. She exhibits no distension. There is no tenderness. There is no rebound and no guarding.  Genitourinary:  Cervix closed  Musculoskeletal: Normal range of motion.  Neurological: She is alert and oriented to person, place, and time.  Skin: Skin is warm and dry.  Psychiatric: She has a normal mood and affect.    MAU Course  Procedures  Discussed with Dr. Jodi Mourning- office will arrange for counseling and depression medications IVF LR given to patient and pain subsided NST reactive with UI and occ ctx- better after IVF Results for orders placed or performed during the hospital encounter of 12/13/14 (from the past 24 hour(s))  Urinalysis, Routine w reflex microscopic (not at [facility])     Status: Abnormal   Collection Time: 12/13/14 12:00 PM  Result Value Ref Range   Color, Urine YELLOW YELLOW   APPearance HAZY (A) CLEAR   Specific Gravity, Urine 1.025 1.005 - 1.030   pH 6.0 5.0 - 8.0   Glucose, UA NEGATIVE NEGATIVE mg/dL   Hgb urine dipstick TRACE (A) NEGATIVE   Bilirubin Urine NEGATIVE NEGATIVE   Ketones, ur NEGATIVE NEGATIVE mg/dL   Protein, ur NEGATIVE NEGATIVE mg/dL   Urobilinogen, UA 0.2 0.0 - 1.0 mg/dL   Nitrite NEGATIVE NEGATIVE   Leukocytes, UA NEGATIVE NEGATIVE  Urine microscopic-add on     Status: Abnormal   Collection Time: 12/13/14 12:00 PM  Result Value Ref Range   Squamous  Epithelial / LPF FEW (A) RARE   WBC, UA 0-2 <3 WBC/hpf  Fetal fibronectin     Status: None   Collection Time: 12/13/14  1:45 PM  Result Value Ref Range   Fetal Fibronectin NEGATIVE NEGATIVE  Glucose, capillary     Status: None   Collection Time: 12/13/14  2:11 PM  Result Value Ref Range   Glucose-Capillary 87 65 - 99 mg/dL  fetal fibronectin neg Pt ready to go home;  Pt feeling baby move while in MAU Pt very anxious about this pregnancy BPP done 8/8 preliminary report Assessment and Plan  Abdominal pain in pregnancy- third trimester Hx of neonatal fetal demise Depression- recommend counseling and antidepressive  per OB F/u with scheduled appointment next week Kick counts form given and reviewed with pt   Ory Elting 12/13/2014, 1:09 PM

## 2014-12-13 NOTE — Discharge Instructions (Signed)

## 2014-12-16 ENCOUNTER — Ambulatory Visit (HOSPITAL_COMMUNITY): Payer: Medicaid Other | Attending: Obstetrics

## 2014-12-18 ENCOUNTER — Telehealth: Payer: Self-pay | Admitting: *Deleted

## 2014-12-18 NOTE — Telephone Encounter (Signed)
Patient is calling with white vaginal discharge. 9/15 10:30 Call to patient - she describes the discharge as thin and mucus without irritation. She has not had change in the symptoms she has been having- 1-2 contractions/day and she states her blood sugars are good -she is taking her medication.  Her baby is moving well. She has an appointment tomorrow and we will evaluate at that time.

## 2014-12-19 ENCOUNTER — Ambulatory Visit (INDEPENDENT_AMBULATORY_CARE_PROVIDER_SITE_OTHER): Payer: Medicaid Other | Admitting: Obstetrics

## 2014-12-19 ENCOUNTER — Other Ambulatory Visit: Payer: Self-pay | Admitting: *Deleted

## 2014-12-19 ENCOUNTER — Encounter: Payer: Self-pay | Admitting: Obstetrics

## 2014-12-19 ENCOUNTER — Ambulatory Visit (HOSPITAL_COMMUNITY)
Admission: RE | Admit: 2014-12-19 | Discharge: 2014-12-19 | Disposition: A | Discharge status: Home / Self Care | Payer: Medicaid Other | Source: Ambulatory Visit | Attending: Obstetrics | Admitting: Obstetrics

## 2014-12-19 ENCOUNTER — Encounter (HOSPITAL_COMMUNITY): Payer: Self-pay

## 2014-12-19 VITALS — BP 119/82 | HR 101 | Wt 147.5 lb

## 2014-12-19 VITALS — BP 125/81 | HR 90 | Temp 98.1°F | Wt 147.0 lb

## 2014-12-19 DIAGNOSIS — Z3493 Encounter for supervision of normal pregnancy, unspecified, third trimester: Secondary | ICD-10-CM

## 2014-12-19 DIAGNOSIS — O09293 Supervision of pregnancy with other poor reproductive or obstetric history, third trimester: Secondary | ICD-10-CM

## 2014-12-19 DIAGNOSIS — O0993 Supervision of high risk pregnancy, unspecified, third trimester: Secondary | ICD-10-CM

## 2014-12-19 DIAGNOSIS — I10 Essential (primary) hypertension: Secondary | ICD-10-CM

## 2014-12-19 LAB — POCT URINALYSIS DIPSTICK
BILIRUBIN UA: NEGATIVE
Glucose, UA: NEGATIVE
Ketones, UA: NEGATIVE
Leukocytes, UA: NEGATIVE
NITRITE UA: NEGATIVE
PH UA: 6
RBC UA: NEGATIVE
Spec Grav, UA: 1.02
UROBILINOGEN UA: NEGATIVE
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
## 2014-12-22 ENCOUNTER — Encounter (HOSPITAL_COMMUNITY): Payer: Self-pay | Admitting: *Deleted

## 2014-12-22 ENCOUNTER — Inpatient Hospital Stay (HOSPITAL_COMMUNITY)
Admission: AD | Admit: 2014-12-22 | Discharge: 2014-12-22 | Disposition: A | Discharge status: Home / Self Care | Payer: Medicaid Other | Source: Ambulatory Visit | Attending: Obstetrics | Admitting: Obstetrics

## 2014-12-22 DIAGNOSIS — O4703 False labor before 37 completed weeks of gestation, third trimester: Secondary | ICD-10-CM | POA: Diagnosis not present

## 2014-12-22 DIAGNOSIS — R102 Pelvic and perineal pain: Secondary | ICD-10-CM | POA: Diagnosis present

## 2014-12-22 DIAGNOSIS — Z3A33 33 weeks gestation of pregnancy: Secondary | ICD-10-CM | POA: Insufficient documentation

## 2014-12-22 DIAGNOSIS — Z7982 Long term (current) use of aspirin: Secondary | ICD-10-CM | POA: Diagnosis not present

## 2014-12-22 LAB — URINALYSIS, ROUTINE W REFLEX MICROSCOPIC
Bilirubin Urine: NEGATIVE
Glucose, UA: 500 mg/dL — AB
Ketones, ur: NEGATIVE mg/dL
LEUKOCYTES UA: NEGATIVE
NITRITE: NEGATIVE
PROTEIN: NEGATIVE mg/dL
SPECIFIC GRAVITY, URINE: 1.02 (ref 1.005–1.030)
UROBILINOGEN UA: 0.2 mg/dL (ref 0.0–1.0)
pH: 7 (ref 5.0–8.0)

## 2014-12-22 LAB — URINE MICROSCOPIC-ADD ON

## 2014-12-22 NOTE — Discharge Instructions (Signed)
Braxton Hicks Contractions °Contractions of the uterus can occur throughout pregnancy. Contractions are not always a sign that you are in labor.  °WHAT ARE BRAXTON HICKS CONTRACTIONS?  °Contractions that occur before labor are called Braxton Hicks contractions, or false labor. Toward the end of pregnancy (32-34 weeks), these contractions can develop more often and may become more forceful. This is not true labor because these contractions do not result in opening (dilatation) and thinning of the cervix. They are sometimes difficult to tell apart from true labor because these contractions can be forceful and people have different pain tolerances. You should not feel embarrassed if you go to the hospital with false labor. Sometimes, the only way to tell if you are in true labor is for your health care provider to look for changes in the cervix. °If there are no prenatal problems or other health problems associated with the pregnancy, it is completely safe to be sent home with false labor and await the onset of true labor. °HOW CAN YOU TELL THE DIFFERENCE BETWEEN TRUE AND FALSE LABOR? °False Labor °· The contractions of false labor are usually shorter and not as hard as those of true labor.   °· The contractions are usually irregular.   °· The contractions are often felt in the front of the lower abdomen and in the groin.   °· The contractions may go away when you walk around or change positions while lying down.   °· The contractions get weaker and are shorter lasting as time goes on.   °· The contractions do not usually become progressively stronger, regular, and closer together as with true labor.   °True Labor °· Contractions in true labor last 30-70 seconds, become very regular, usually become more intense, and increase in frequency.   °· The contractions do not go away with walking.   °· The discomfort is usually felt in the top of the uterus and spreads to the lower abdomen and low back.   °· True labor can be  determined by your health care provider with an exam. This will show that the cervix is dilating and getting thinner.   °WHAT TO REMEMBER °· Keep up with your usual exercises and follow other instructions given by your health care provider.   °· Take medicines as directed by your health care provider.   °· Keep your regular prenatal appointments.   °· Eat and drink lightly if you think you are going into labor.   °· If Braxton Hicks contractions are making you uncomfortable:   °¨ Change your position from lying down or resting to walking, or from walking to resting.   °¨ Sit and rest in a tub of warm water.   °¨ Drink 2-3 glasses of water. Dehydration may cause these contractions.   °¨ Do slow and deep breathing several times an hour.   °WHEN SHOULD I SEEK IMMEDIATE MEDICAL CARE? °Seek immediate medical care if: °· Your contractions become stronger, more regular, and closer together.   °· You have fluid leaking or gushing from your vagina.   °· You have a fever.   °· You pass blood-tinged mucus.   °· You have vaginal bleeding.   °· You have continuous abdominal pain.   °· You have low back pain that you never had before.   °· You feel your baby's head pushing down and causing pelvic pressure.   °· Your baby is not moving as much as it used to.   °Document Released: 03/21/2005 Document Revised: 03/26/2013 Document Reviewed: 12/31/2012 °Exit[employer]® Patient Information ©2015 [employer]. This information is not intended to replace advice given to you by your health care   provider. Make sure you discuss any questions you have with your health care provider. ° °

## 2014-12-22 NOTE — MAU Note (Signed)
Urine in lab 

## 2014-12-22 NOTE — MAU Provider Note (Signed)
History   G3P0110 at 33 wks in with c/o pelvic pressure that has been going on for days. GFM.  CSN: [id number]  Arrival date and time: 12/22/14 1452   None     No chief complaint on file.  HPI  OB History    Gravida Para Term Preterm AB TAB SAB Ectopic Multiple Living   3 1 0 1 1 0 1 0 0 0       Past Medical History  Diagnosis Date  . Depression     depression  . Bipolar disorder   . ADHD (attention deficit hyperactivity disorder)   . Anxiety   . Anemia   . ADHD (attention deficit hyperactivity disorder)   . Bipolar 1 disorder   . Chlamydia 05-31-10  . Pseudocyesis 2013    Seen in MAU for percieved FM, abd distension. Normal exam.   . Gonorrhea contact, treated   . Pregnancy induced hypertension   . Headache   . Infection     UTI  . Gestational diabetes     Past Surgical History  Procedure Laterality Date  . Nasal septum surgery    . Cesarean section N/A 09/01/2012    Procedure:  Primary cesarean section with delivery of baby girl at 79.  Apgars 1/1.  ;  Surgeon: Frederico Hamman, MD;  Location: [facility];  Service: Obstetrics;  Laterality: N/A;    Family History  Problem Relation Age of Onset  . Kidney disease Mother   . Hypertension Father   . Cancer Sister   . Asthma Brother   . Heart disease Neg Hx   . Diabetes Maternal Grandfather     Social History  Substance Use Topics  . Smoking status: Never Smoker   . Smokeless tobacco: Never Used  . Alcohol Use: No    Allergies: No Known Allergies  Prescriptions prior to admission  Medication Sig Dispense Refill Last Dose  . aspirin EC 81 MG tablet Take 81 mg by mouth daily.   Taking  . Blood Glucose Monitoring Suppl (ACCU-CHEK AVIVA) device Patient to check blood sugar fasting and 2 hours after each meal. 1 each 0 Taking  . ferrous sulfate (FERROUSUL) 325 (65 FE) MG tablet Take 1 tablet (325 mg total) by mouth 2 (two) times daily. 60 tablet 1 Taking  . glucose blood (ACCU-CHEK AVIVA) test strip  Patient to check blood sugar fasting and 2 hours after each meal 100 each 12 Taking  . Lancets (ACCU-CHEK MULTICLIX) lancets Patient to check blood sugar fasting and 2 hours after each meal. 100 each 12 Taking  . loratadine (CLARITIN) 10 MG tablet Take 1 tablet (10 mg total) by mouth daily. (Patient not taking: Reported on 12/13/2014) 30 tablet 11 Not Taking  . metFORMIN (GLUCOPHAGE) 500 MG tablet Take 1 tablet (500 mg total) by mouth 2 (two) times daily with a meal. 60 tablet 5 Taking  . potassium chloride SA (K-DUR,KLOR-CON) 20 MEQ tablet Take 1 tablet (20 mEq total) by mouth 2 (two) times daily. 14 tablet 0 Taking  . Prenat-FeFum-FePo-FA-Omega 3 (CONCEPT DHA) 53.5-38-1 MG CAPS Take 1 capsule by mouth daily. 30 capsule 11 Taking  . promethazine (PHENERGAN) 25 MG tablet Take 0.5-1 tablets (12.5-25 mg total) by mouth every 6 (six) hours as needed for nausea or vomiting. 30 tablet 0 Taking  . ranitidine (ZANTAC) 150 MG tablet Take 1 tablet (150 mg total) by mouth 2 (two) times daily. 60 tablet 11 Taking    Review of Systems  Constitutional: Negative.  HENT: Negative.   Eyes: Negative.   Respiratory: Negative.   Cardiovascular: Negative.   Gastrointestinal: Positive for abdominal pain.  Genitourinary: Negative.   Musculoskeletal: Negative.   Skin: Negative.   Neurological: Negative.   Endo/Heme/Allergies: Negative.   Psychiatric/Behavioral: Negative.    Physical Exam   Last menstrual period 04/30/2014.  Physical Exam  Constitutional: She is oriented to person, place, and time. She appears well-developed and well-nourished.  HENT:  Head: Normocephalic.  Eyes: Pupils are equal, round, and reactive to light.  Neck: Normal range of motion.  Cardiovascular: Normal rate, regular rhythm, normal heart sounds and intact distal pulses.   Respiratory: Effort normal and breath sounds normal.  GI: Soft. Bowel sounds are normal.  Genitourinary: Vagina normal and uterus normal.  Musculoskeletal:  Normal range of motion.  Neurological: She is alert and oriented to person, place, and time. She has normal reflexes.  Skin: Skin is warm and dry.  Psychiatric: She has a normal mood and affect. Her behavior is normal. Judgment and thought content normal.    MAU Course  Procedures  MDM False labor  Assessment and Plan  SVE cl/th/mid and high. FHR pattern reassurring. D/c home.  Stephanie Mack Stephanie Mack 12/22/2014, 3:14 PM

## 2014-12-22 NOTE — MAU Note (Signed)
Pt 33wks and experiencing lower abd pain and pressure for one week.  Says she has had this pain but it feels worse to her.  Denies any vag bleeding and reports white thin discharge with a little odor.  States the baby is active.  Difficult to obtain answers from patient as she changes her answer to each question.

## 2014-12-23 ENCOUNTER — Other Ambulatory Visit (HOSPITAL_COMMUNITY): Payer: Medicaid Other

## 2014-12-24 ENCOUNTER — Ambulatory Visit (INDEPENDENT_AMBULATORY_CARE_PROVIDER_SITE_OTHER): Payer: Medicaid Other | Admitting: Obstetrics

## 2014-12-24 VITALS — BP 134/84 | HR 101 | Temp 97.3°F | Wt 148.0 lb

## 2014-12-24 DIAGNOSIS — O09293 Supervision of pregnancy with other poor reproductive or obstetric history, third trimester: Secondary | ICD-10-CM | POA: Diagnosis not present

## 2014-12-24 DIAGNOSIS — O0993 Supervision of high risk pregnancy, unspecified, third trimester: Secondary | ICD-10-CM

## 2014-12-24 LAB — POCT URINALYSIS DIPSTICK
Bilirubin, UA: NEGATIVE
Blood, UA: NEGATIVE
Glucose, UA: 100
KETONES UA: NEGATIVE
LEUKOCYTES UA: NEGATIVE
NITRITE UA: NEGATIVE
PH UA: 6
PROTEIN UA: NEGATIVE
Spec Grav, UA: 1.015
UROBILINOGEN UA: NEGATIVE

## 2014-12-25 ENCOUNTER — Ambulatory Visit (HOSPITAL_COMMUNITY): Payer: Medicaid Other

## 2014-12-25 ENCOUNTER — Inpatient Hospital Stay (HOSPITAL_COMMUNITY)
Admission: AD | Admit: 2014-12-25 | Discharge: 2014-12-25 | Disposition: A | Discharge status: Home / Self Care | Payer: Medicaid Other | Source: Ambulatory Visit | Attending: Obstetrics | Admitting: Obstetrics

## 2014-12-25 ENCOUNTER — Encounter (HOSPITAL_COMMUNITY): Payer: Self-pay

## 2014-12-25 ENCOUNTER — Encounter: Payer: Self-pay | Admitting: Obstetrics

## 2014-12-25 ENCOUNTER — Other Ambulatory Visit (HOSPITAL_COMMUNITY): Payer: Medicaid Other

## 2014-12-25 ENCOUNTER — Other Ambulatory Visit: Payer: Self-pay | Admitting: Obstetrics

## 2014-12-25 DIAGNOSIS — B373 Candidiasis of vulva and vagina: Secondary | ICD-10-CM

## 2014-12-25 DIAGNOSIS — B3731 Acute candidiasis of vulva and vagina: Secondary | ICD-10-CM

## 2014-12-25 DIAGNOSIS — N760 Acute vaginitis: Secondary | ICD-10-CM

## 2014-12-25 DIAGNOSIS — A749 Chlamydial infection, unspecified: Secondary | ICD-10-CM

## 2014-12-25 DIAGNOSIS — F319 Bipolar disorder, unspecified: Secondary | ICD-10-CM | POA: Diagnosis not present

## 2014-12-25 DIAGNOSIS — F419 Anxiety disorder, unspecified: Secondary | ICD-10-CM | POA: Diagnosis not present

## 2014-12-25 DIAGNOSIS — Z3A34 34 weeks gestation of pregnancy: Secondary | ICD-10-CM | POA: Insufficient documentation

## 2014-12-25 DIAGNOSIS — O98813 Other maternal infectious and parasitic diseases complicating pregnancy, third trimester: Principal | ICD-10-CM

## 2014-12-25 DIAGNOSIS — O4703 False labor before 37 completed weeks of gestation, third trimester: Secondary | ICD-10-CM | POA: Diagnosis not present

## 2014-12-25 DIAGNOSIS — B9689 Other specified bacterial agents as the cause of diseases classified elsewhere: Secondary | ICD-10-CM

## 2014-12-25 DIAGNOSIS — F329 Major depressive disorder, single episode, unspecified: Secondary | ICD-10-CM | POA: Diagnosis not present

## 2014-12-25 LAB — URINALYSIS, ROUTINE W REFLEX MICROSCOPIC
Bilirubin Urine: NEGATIVE
HGB URINE DIPSTICK: NEGATIVE
Ketones, ur: 15 mg/dL — AB
Leukocytes, UA: NEGATIVE
Nitrite: NEGATIVE
PH: 6 (ref 5.0–8.0)
Protein, ur: NEGATIVE mg/dL
Urobilinogen, UA: 0.2 mg/dL (ref 0.0–1.0)

## 2014-12-25 LAB — SURESWAB, VAGINOSIS/VAGINITIS PLUS
ATOPOBIUM VAGINAE: NOT DETECTED Log (cells/mL)
BV CATEGORY: UNDETERMINED — AB
C. GLABRATA, DNA: NOT DETECTED
C. albicans, DNA: DETECTED — AB
C. parapsilosis, DNA: NOT DETECTED
C. trachomatis RNA, TMA: DETECTED — AB
C. tropicalis, DNA: NOT DETECTED
GARDNERELLA VAGINALIS: NOT DETECTED Log (cells/mL)
LACTOBACILLUS SPECIES: 5.6 Log (cells/mL)
MEGASPHAERA SPECIES: 6.3 Log (cells/mL)
N. gonorrhoeae RNA, TMA: NOT DETECTED
T. vaginalis RNA, QL TMA: NOT DETECTED

## 2014-12-25 LAB — URINE MICROSCOPIC-ADD ON

## 2014-12-25 MED ORDER — CLINDAMYCIN HCL 300 MG PO CAPS
300.0000 mg | ORAL_CAPSULE | Freq: Three times a day (TID) | ORAL | Status: DC
Start: 2014-12-25 — End: 2015-01-28

## 2014-12-25 MED ORDER — TERCONAZOLE 0.4 % VA CREA: 1.0000 | TOPICAL_CREAM | Freq: Every day | VAGINAL | Status: DC

## 2014-12-25 MED ORDER — AZITHROMYCIN 250 MG PO TABS
1000.0000 mg | ORAL_TABLET | Freq: Once | ORAL | Status: AC
  Administered 2014-12-25: 1000 mg via ORAL
  Filled 2014-12-25: qty 4

## 2014-12-25 MED ORDER — CEFIXIME 400 MG PO TABS: 400.0000 mg | ORAL_TABLET | Freq: Every day | ORAL | Status: DC

## 2014-12-25 MED ORDER — AZITHROMYCIN 250 MG PO TABS: 1000.0000 mg | ORAL_TABLET | Freq: Once | ORAL | Status: DC

## 2014-12-25 NOTE — MAU Note (Signed)
Pt c/o contractions starting at 8:00pm tonight that are every 10 mins. Denies LOF or vag bleeding. +FM.

## 2014-12-25 NOTE — MAU Provider Note (Signed)
History     CSN: [id number]  Arrival date and time: 12/25/14 2124   First Provider Initiated Contact with Patient 12/25/14 2302      Chief Complaint  Patient presents with  . Contractions   HPI Comments: Stephanie Mack is a 24 y.o. G3P0110 at [redacted]w[redacted]d who presents today with contractions. She states that they started at 2000. She denies any VB or LOF. She confirms fetal movement.   On review of the chart it is noted that patient had +chlamydia last week. She states that she has not had treatment for that at this time.   Abdominal Pain This is a new problem. The current episode started today (at 2000). The onset quality is gradual. The problem occurs intermittently. The problem has been unchanged. The pain is located in the suprapubic region. The pain is at a severity of 6/10. The quality of the pain is colicky and cramping. The abdominal pain does not radiate. Pertinent negatives include no constipation, diarrhea, dysuria, fever, frequency, nausea or vomiting. Nothing aggravates the pain. The pain is relieved by nothing. She has tried nothing for the symptoms.    Past Medical History  Diagnosis Date  . Depression     depression  . Bipolar disorder   . ADHD (attention deficit hyperactivity disorder)   . Anxiety   . Anemia   . ADHD (attention deficit hyperactivity disorder)   . Bipolar 1 disorder   . Chlamydia 05-31-10  . Pseudocyesis 2013    Seen in MAU for percieved FM, abd distension. Normal exam.   . Gonorrhea contact, treated   . Pregnancy induced hypertension   . Headache   . Infection     UTI  . Gestational diabetes     Past Surgical History  Procedure Laterality Date  . Nasal septum surgery    . Cesarean section N/A 09/01/2012    Procedure:  Primary cesarean section with delivery of baby girl at 44.  Apgars 1/1.  ;  Surgeon: Frederico Hamman, MD;  Location: [facility];  Service: Obstetrics;  Laterality: N/A;    Family History  Problem Relation Age of Onset  .  Kidney disease Mother   . Hypertension Father   . Cancer Sister   . Asthma Brother   . Heart disease Neg Hx   . Diabetes Maternal Grandfather     Social History  Substance Use Topics  . Smoking status: Never Smoker   . Smokeless tobacco: Never Used  . Alcohol Use: No    Allergies: No Known Allergies  Prescriptions prior to admission  Medication Sig Dispense Refill Last Dose  . aspirin EC 81 MG tablet Take 81 mg by mouth daily.   12/25/2014 at 1200  . azithromycin (ZITHROMAX) 250 MG tablet Take 4 tablets (1,000 mg total) by mouth once. 4 tablet 0 haven't pu yet  . Blood Glucose Monitoring Suppl (ACCU-CHEK AVIVA) device Patient to check blood sugar fasting and 2 hours after each meal. 1 each 0 12/25/2014 at Unknown time  . cefixime (SUPRAX) 400 MG tablet Take 1 tablet (400 mg total) by mouth daily. 1 tablet 0 haven't pu yet  . clindamycin (CLEOCIN) 300 MG capsule Take 1 capsule (300 mg total) by mouth 3 (three) times daily. 21 capsule 0 haven't pu yet  . ferrous sulfate (FERROUSUL) 325 (65 FE) MG tablet Take 1 tablet (325 mg total) by mouth 2 (two) times daily. 60 tablet 1 12/24/2014 at Unknown time  . glucose blood (ACCU-CHEK AVIVA) test strip Patient to  check blood sugar fasting and 2 hours after each meal 100 each 12 12/25/2014 at Unknown time  . Lancets (ACCU-CHEK MULTICLIX) lancets Patient to check blood sugar fasting and 2 hours after each meal. 100 each 12 12/25/2014 at Unknown time  . metFORMIN (GLUCOPHAGE) 500 MG tablet Take 1 tablet (500 mg total) by mouth 2 (two) times daily with a meal. 60 tablet 5 12/25/2014 at 1200  . potassium chloride SA (K-DUR,KLOR-CON) 20 MEQ tablet Take 1 tablet (20 mEq total) by mouth 2 (two) times daily. 14 tablet 0 12/24/2014 at Unknown time  . Prenat-FeFum-FePo-FA-Omega 3 (CONCEPT DHA) 53.5-38-1 MG CAPS Take 1 capsule by mouth daily. 30 capsule 11 12/25/2014 at 1200  . ranitidine (ZANTAC) 150 MG tablet Take 1 tablet (150 mg total) by mouth 2 (two) times  daily. 60 tablet 11 12/25/2014 at 1900  . terconazole (TERAZOL 7) 0.4 % vaginal cream Place 1 applicator vaginally at bedtime. 45 g 0 not pu yet  . loratadine (CLARITIN) 10 MG tablet Take 1 tablet (10 mg total) by mouth daily. (Patient not taking: Reported on 12/13/2014) 30 tablet 11 Not Taking at Unknown time  . promethazine (PHENERGAN) 25 MG tablet Take 0.5-1 tablets (12.5-25 mg total) by mouth every 6 (six) hours as needed for nausea or vomiting. (Patient not taking: Reported on 12/25/2014) 30 tablet 0 Not Taking at Unknown time    Review of Systems  Constitutional: Negative for fever.  Gastrointestinal: Positive for abdominal pain. Negative for nausea, vomiting, diarrhea and constipation.  Genitourinary: Negative for dysuria, urgency and frequency.   Physical Exam   Blood pressure 128/86, pulse 115, temperature 97.8 F (36.6 C), temperature source Oral, resp. rate 18, height 5\' 1"  (1.549 m), weight 67.132 kg (148 lb), last menstrual period 04/30/2014, SpO2 99 %.  Physical Exam  Nursing note and vitals reviewed. Constitutional: She is oriented to person, place, and time. She appears well-developed and well-nourished. No distress.  HENT:  Head: Normocephalic.  Cardiovascular: Normal rate.   Respiratory: Effort normal.  GI: Soft. There is no tenderness. There is no rebound.  Neurological: She is alert and oriented to person, place, and time.  Skin: Skin is warm and dry.  Psychiatric: She has a normal mood and affect.   FHT 145, mderate with 15x15 accels, no decels Toco: no UCs RN checked patient: FT/THICK MAU Course  Procedures  MDM Patient treated with 1g azithromycin here in MAU Recheck by RN after one hour, no change    Assessment and Plan   1. False labor before 37 completed weeks of gestation in third trimester    DC home Comfort measures reviewed  3rd Trimester precautions  PTL precautions  Fetal kick counts RX: none  Return to MAU as needed Encouraged partner  treatment for chlamydia   Follow-up Information    Follow up with Shelly Bombard, MD.   Specialty:  Obstetrics and Gynecology   Why:  As scheduled   Contact information:   [address] [place] [address] [phone]         Mathis Bud 12/25/2014, 11:10 PM

## 2014-12-25 NOTE — Discharge Instructions (Signed)
Braxton Hicks Contractions °Contractions of the uterus can occur throughout pregnancy. Contractions are not always a sign that you are in labor.  °WHAT ARE BRAXTON HICKS CONTRACTIONS?  °Contractions that occur before labor are called Braxton Hicks contractions, or false labor. Toward the end of pregnancy (32-34 weeks), these contractions can develop more often and may become more forceful. This is not true labor because these contractions do not result in opening (dilatation) and thinning of the cervix. They are sometimes difficult to tell apart from true labor because these contractions can be forceful and people have different pain tolerances. You should not feel embarrassed if you go to the hospital with false labor. Sometimes, the only way to tell if you are in true labor is for your health care provider to look for changes in the cervix. °If there are no prenatal problems or other health problems associated with the pregnancy, it is completely safe to be sent home with false labor and await the onset of true labor. °HOW CAN YOU TELL THE DIFFERENCE BETWEEN TRUE AND FALSE LABOR? °False Labor °· The contractions of false labor are usually shorter and not as hard as those of true labor.   °· The contractions are usually irregular.   °· The contractions are often felt in the front of the lower abdomen and in the groin.   °· The contractions may go away when you walk around or change positions while lying down.   °· The contractions get weaker and are shorter lasting as time goes on.   °· The contractions do not usually become progressively stronger, regular, and closer together as with true labor.   °True Labor °· Contractions in true labor last 30-70 seconds, become very regular, usually become more intense, and increase in frequency.   °· The contractions do not go away with walking.   °· The discomfort is usually felt in the top of the uterus and spreads to the lower abdomen and low back.   °· True labor can be  determined by your health care provider with an exam. This will show that the cervix is dilating and getting thinner.   °WHAT TO REMEMBER °· Keep up with your usual exercises and follow other instructions given by your health care provider.   °· Take medicines as directed by your health care provider.   °· Keep your regular prenatal appointments.   °· Eat and drink lightly if you think you are going into labor.   °· If Braxton Hicks contractions are making you uncomfortable:   °¨ Change your position from lying down or resting to walking, or from walking to resting.   °¨ Sit and rest in a tub of warm water.   °¨ Drink 2-3 glasses of water. Dehydration may cause these contractions.   °¨ Do slow and deep breathing several times an hour.   °WHEN SHOULD I SEEK IMMEDIATE MEDICAL CARE? °Seek immediate medical care if: °· Your contractions become stronger, more regular, and closer together.   °· You have fluid leaking or gushing from your vagina.   °· You have a fever.   °· You pass blood-tinged mucus.   °· You have vaginal bleeding.   °· You have continuous abdominal pain.   °· You have low back pain that you never had before.   °· You feel your baby's head pushing down and causing pelvic pressure.   °· Your baby is not moving as much as it used to.   °Document Released: 03/21/2005 Document Revised: 03/26/2013 Document Reviewed: 12/31/2012 °Exit[employer]® Patient Information ©2015 [employer]. This information is not intended to replace advice given to you by your health care   provider. Make sure you discuss any questions you have with your health care provider. ° °
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
## 2014-12-26 ENCOUNTER — Encounter: Payer: Medicaid Other | Admitting: Obstetrics

## 2014-12-26 ENCOUNTER — Ambulatory Visit (HOSPITAL_COMMUNITY)
Admission: RE | Admit: 2014-12-26 | Discharge: 2014-12-26 | Disposition: A | Discharge status: Home / Self Care | Payer: Medicaid Other | Source: Ambulatory Visit | Attending: Obstetrics | Admitting: Obstetrics

## 2014-12-26 ENCOUNTER — Other Ambulatory Visit (HOSPITAL_COMMUNITY): Payer: Self-pay | Admitting: Maternal and Fetal Medicine

## 2014-12-26 ENCOUNTER — Inpatient Hospital Stay (HOSPITAL_COMMUNITY)
Admission: RE | Admit: 2014-12-26 | Discharge: 2014-12-26 | Disposition: A | Discharge status: Home / Self Care | Payer: Medicaid Other | Source: Ambulatory Visit | Attending: Obstetrics | Admitting: Obstetrics

## 2014-12-26 ENCOUNTER — Encounter (HOSPITAL_COMMUNITY): Payer: Self-pay

## 2014-12-26 DIAGNOSIS — O09293 Supervision of pregnancy with other poor reproductive or obstetric history, third trimester: Secondary | ICD-10-CM | POA: Diagnosis present

## 2014-12-26 DIAGNOSIS — Z3A34 34 weeks gestation of pregnancy: Secondary | ICD-10-CM

## 2014-12-26 DIAGNOSIS — O34219 Maternal care for unspecified type scar from previous cesarean delivery: Secondary | ICD-10-CM

## 2014-12-26 DIAGNOSIS — O24424 Gestational diabetes mellitus in childbirth, insulin controlled: Secondary | ICD-10-CM | POA: Diagnosis not present

## 2014-12-26 DIAGNOSIS — O360930 Maternal care for other rhesus isoimmunization, third trimester, not applicable or unspecified: Secondary | ICD-10-CM | POA: Insufficient documentation

## 2014-12-26 DIAGNOSIS — O3421 Maternal care for scar from previous cesarean delivery: Secondary | ICD-10-CM | POA: Diagnosis not present

## 2014-12-30 ENCOUNTER — Ambulatory Visit (HOSPITAL_COMMUNITY)
Admission: RE | Admit: 2014-12-30 | Discharge: 2014-12-30 | Disposition: A | Discharge status: Home / Self Care | Payer: Medicaid Other | Source: Ambulatory Visit | Attending: Obstetrics | Admitting: Obstetrics

## 2014-12-30 ENCOUNTER — Other Ambulatory Visit (HOSPITAL_COMMUNITY): Payer: Medicaid Other

## 2014-12-30 ENCOUNTER — Encounter (HOSPITAL_COMMUNITY): Payer: Self-pay

## 2014-12-30 ENCOUNTER — Ambulatory Visit (HOSPITAL_COMMUNITY): Payer: Medicaid Other

## 2014-12-30 VITALS — BP 125/84 | HR 96 | Wt 149.4 lb

## 2014-12-30 DIAGNOSIS — O3421 Maternal care for scar from previous cesarean delivery: Secondary | ICD-10-CM | POA: Insufficient documentation

## 2014-12-30 DIAGNOSIS — O24424 Gestational diabetes mellitus in childbirth, insulin controlled: Secondary | ICD-10-CM | POA: Diagnosis not present

## 2014-12-30 DIAGNOSIS — Z3A35 35 weeks gestation of pregnancy: Secondary | ICD-10-CM | POA: Diagnosis not present

## 2014-12-30 DIAGNOSIS — O26893 Other specified pregnancy related conditions, third trimester: Secondary | ICD-10-CM | POA: Diagnosis not present

## 2014-12-30 DIAGNOSIS — Z6791 Unspecified blood type, Rh negative: Secondary | ICD-10-CM | POA: Insufficient documentation

## 2014-12-30 DIAGNOSIS — O09299 Supervision of pregnancy with other poor reproductive or obstetric history, unspecified trimester: Secondary | ICD-10-CM | POA: Insufficient documentation

## 2014-12-30 DIAGNOSIS — O09293 Supervision of pregnancy with other poor reproductive or obstetric history, third trimester: Secondary | ICD-10-CM

## 2014-12-31 ENCOUNTER — Inpatient Hospital Stay (HOSPITAL_COMMUNITY)
Admission: AD | Admit: 2014-12-31 | Discharge: 2014-12-31 | Disposition: A | Discharge status: Home / Self Care | Payer: Medicaid Other | Source: Ambulatory Visit | Attending: Obstetrics | Admitting: Obstetrics

## 2014-12-31 ENCOUNTER — Encounter: Payer: Self-pay | Admitting: Obstetrics

## 2014-12-31 ENCOUNTER — Ambulatory Visit (INDEPENDENT_AMBULATORY_CARE_PROVIDER_SITE_OTHER): Payer: Medicaid Other | Admitting: Obstetrics

## 2014-12-31 VITALS — BP 139/93 | HR 93 | Temp 97.9°F | Wt 149.0 lb

## 2014-12-31 DIAGNOSIS — A568 Sexually transmitted chlamydial infection of other sites: Secondary | ICD-10-CM | POA: Insufficient documentation

## 2014-12-31 DIAGNOSIS — Z3A35 35 weeks gestation of pregnancy: Secondary | ICD-10-CM | POA: Diagnosis not present

## 2014-12-31 DIAGNOSIS — O98313 Other infections with a predominantly sexual mode of transmission complicating pregnancy, third trimester: Secondary | ICD-10-CM | POA: Insufficient documentation

## 2014-12-31 DIAGNOSIS — O09293 Supervision of pregnancy with other poor reproductive or obstetric history, third trimester: Secondary | ICD-10-CM | POA: Diagnosis not present

## 2014-12-31 DIAGNOSIS — O0993 Supervision of high risk pregnancy, unspecified, third trimester: Secondary | ICD-10-CM

## 2014-12-31 LAB — POCT URINALYSIS DIPSTICK
Bilirubin, UA: NEGATIVE
Blood, UA: NEGATIVE
Glucose, UA: NEGATIVE
KETONES UA: NEGATIVE
Nitrite, UA: NEGATIVE
Urobilinogen, UA: NEGATIVE
pH, UA: 7

## 2014-12-31 MED ORDER — CEFTRIAXONE SODIUM 250 MG IJ SOLR
250.0000 mg | Freq: Once | INTRAMUSCULAR | Status: AC
  Administered 2014-12-31: 250 mg via INTRAMUSCULAR
  Filled 2014-12-31: qty 250

## 2014-12-31 MED ORDER — AZITHROMYCIN 1 G PO PACK
1.0000 g | PACK | Freq: Once | ORAL | Status: AC
  Administered 2014-12-31: 1 g via ORAL
  Filled 2014-12-31: qty 1
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
## 2014-12-31 NOTE — MAU Note (Signed)
Sent from dr's office, for treatment of Chlamydia, has not picked up RX, did not have $ for co-pay.  Discussed partner treatment- states hs been informed, ? Treatment, no longer together

## 2014-12-31 NOTE — Progress Notes (Signed)
Patient states she is having trouble sleeping. She is having about 3 contractions a night. Patient states she has not been sexually active- she can not pick her medication up until Friday.

## 2015-01-02 ENCOUNTER — Ambulatory Visit (HOSPITAL_COMMUNITY)
Admission: RE | Admit: 2015-01-02 | Discharge: 2015-01-02 | Disposition: A | Discharge status: Home / Self Care | Payer: Medicaid Other | Source: Ambulatory Visit | Attending: Obstetrics | Admitting: Obstetrics

## 2015-01-02 ENCOUNTER — Other Ambulatory Visit (HOSPITAL_COMMUNITY): Payer: Self-pay | Admitting: Maternal and Fetal Medicine

## 2015-01-02 ENCOUNTER — Encounter (HOSPITAL_COMMUNITY): Payer: Self-pay

## 2015-01-02 VITALS — BP 121/79 | HR 91 | Wt 149.4 lb

## 2015-01-02 DIAGNOSIS — O34219 Maternal care for unspecified type scar from previous cesarean delivery: Secondary | ICD-10-CM

## 2015-01-02 DIAGNOSIS — Z3A35 35 weeks gestation of pregnancy: Secondary | ICD-10-CM

## 2015-01-02 DIAGNOSIS — O09893 Supervision of other high risk pregnancies, third trimester: Secondary | ICD-10-CM

## 2015-01-02 DIAGNOSIS — O09293 Supervision of pregnancy with other poor reproductive or obstetric history, third trimester: Secondary | ICD-10-CM

## 2015-01-02 DIAGNOSIS — O09213 Supervision of pregnancy with history of pre-term labor, third trimester: Secondary | ICD-10-CM

## 2015-01-02 DIAGNOSIS — O3421 Maternal care for scar from previous cesarean delivery: Secondary | ICD-10-CM | POA: Diagnosis not present

## 2015-01-02 DIAGNOSIS — O24419 Gestational diabetes mellitus in pregnancy, unspecified control: Secondary | ICD-10-CM | POA: Diagnosis not present

## 2015-01-02 DIAGNOSIS — O360130 Maternal care for anti-D [Rh] antibodies, third trimester, not applicable or unspecified: Secondary | ICD-10-CM | POA: Diagnosis not present

## 2015-01-02 DIAGNOSIS — O360131 Maternal care for anti-D [Rh] antibodies, third trimester, fetus 1: Secondary | ICD-10-CM

## 2015-01-02 DIAGNOSIS — O09212 Supervision of pregnancy with history of pre-term labor, second trimester: Secondary | ICD-10-CM

## 2015-01-06 ENCOUNTER — Inpatient Hospital Stay (EMERGENCY_DEPARTMENT_HOSPITAL)
Admission: AD | Admit: 2015-01-06 | Discharge: 2015-01-06 | Disposition: A | Discharge status: Home / Self Care | Payer: Medicaid Other | Source: Ambulatory Visit | Attending: Obstetrics | Admitting: Obstetrics

## 2015-01-06 ENCOUNTER — Encounter (HOSPITAL_COMMUNITY)
Admission: RE | Admit: 2015-01-06 | Discharge: 2015-01-06 | Disposition: A | Discharge status: Home / Self Care | Payer: Medicaid Other | Source: Ambulatory Visit | Attending: Obstetrics | Admitting: Obstetrics

## 2015-01-06 ENCOUNTER — Encounter (HOSPITAL_COMMUNITY): Payer: Self-pay

## 2015-01-06 ENCOUNTER — Ambulatory Visit (HOSPITAL_COMMUNITY)
Admission: RE | Admit: 2015-01-06 | Discharge: 2015-01-06 | Disposition: A | Discharge status: Home / Self Care | Payer: Medicaid Other | Source: Ambulatory Visit | Attending: Obstetrics | Admitting: Obstetrics

## 2015-01-06 VITALS — BP 128/87 | HR 86 | Wt 155.6 lb

## 2015-01-06 DIAGNOSIS — O133 Gestational [pregnancy-induced] hypertension without significant proteinuria, third trimester: Secondary | ICD-10-CM

## 2015-01-06 DIAGNOSIS — I10 Essential (primary) hypertension: Secondary | ICD-10-CM

## 2015-01-06 DIAGNOSIS — O09293 Supervision of pregnancy with other poor reproductive or obstetric history, third trimester: Secondary | ICD-10-CM

## 2015-01-06 DIAGNOSIS — O26893 Other specified pregnancy related conditions, third trimester: Secondary | ICD-10-CM

## 2015-01-06 DIAGNOSIS — E876 Hypokalemia: Secondary | ICD-10-CM | POA: Diagnosis not present

## 2015-01-06 HISTORY — DX: Gastro-esophageal reflux disease without esophagitis: K21.9

## 2015-01-06 LAB — BASIC METABOLIC PANEL
ANION GAP: 6 (ref 5–15)
BUN: 6 mg/dL (ref 6–20)
CO2: 22 mmol/L (ref 22–32)
Calcium: 8.3 mg/dL — ABNORMAL LOW (ref 8.9–10.3)
Chloride: 108 mmol/L (ref 101–111)
Creatinine, Ser: 0.51 mg/dL (ref 0.44–1.00)
GFR calc Af Amer: >60 mL/min (ref >60)
GFR calc non Af Amer: >60 mL/min (ref >60)
GLUCOSE: 90 mg/dL (ref 65–99)
Potassium: 3 mmol/L — ABNORMAL LOW (ref 3.5–5.1)
Sodium: 136 mmol/L (ref 135–145)

## 2015-01-06 LAB — CBC
HCT: 31.9 % — ABNORMAL LOW (ref 36.0–46.0)
HEMOGLOBIN: 10.3 g/dL — AB (ref 12.0–15.0)
MCH: 24.9 pg — ABNORMAL LOW (ref 26.0–34.0)
MCHC: 32.3 g/dL (ref 30.0–36.0)
MCV: 77.1 fL — ABNORMAL LOW (ref 78.0–100.0)
PLATELETS: 177 10*3/uL (ref 150–400)
RBC: 4.14 MIL/uL (ref 3.87–5.11)
RDW: 15.3 % (ref 11.5–15.5)
WBC: 6 10*3/uL (ref 4.0–10.5)

## 2015-01-06 MED ORDER — ONDANSETRON HCL 4 MG PO TABS
4.0000 mg | ORAL_TABLET | Freq: Once | ORAL | Status: AC
  Administered 2015-01-06: 4 mg via ORAL
  Filled 2015-01-06: qty 1

## 2015-01-06 MED ORDER — POTASSIUM CHLORIDE CRYS ER 20 MEQ PO TBCR
40.0000 meq | EXTENDED_RELEASE_TABLET | Freq: Once | ORAL | Status: AC
  Administered 2015-01-06: 40 meq via ORAL
  Filled 2015-01-06: qty 2

## 2015-01-06 MED ORDER — POTASSIUM CHLORIDE CRYS ER 20 MEQ PO TBCR: 40.0000 meq | EXTENDED_RELEASE_TABLET | Freq: Once | ORAL | Status: DC

## 2015-01-06 NOTE — Patient Instructions (Signed)
Your procedure is scheduled on:  Wednesday, January 07, 2015  Enter through the [employer] [facility] at: 8:00 A.M.  Pick up the phone at the desk and dial [phone].  Call this number if you have problems the morning of surgery: [phone].  Remember: Do NOT eat food or drink after: AFTER MIDNIGHT TONIGHT Take these medicines the morning of surgery with a SIP OF WATER: NONE DO NOT TAKE METFORMIN 24 HOURS BEFORE SURGERY  Do NOT wear jewelry (body piercing), metal hair clips/bobby pins, or nail polish. Do NOT wear lotions, powders, or perfumes.  You may wear deoderant. Do NOT shave for 48 hours prior to surgery. Do NOT bring valuables to the hospital. Leave suitcase in car.  After surgery it may be brought to your room.  For patients admitted to the hospital, checkout time is 11:00 AM the day of discharge.

## 2015-01-06 NOTE — Discharge Instructions (Signed)

## 2015-01-06 NOTE — Progress Notes (Signed)
Jane from Dr. Jacelyn Grip office verified C section prior to 39 weeks is due to previous classical incision.

## 2015-01-06 NOTE — MAU Note (Signed)
Pt presents to MAU for potassium po for scheduled C/S tomorrow

## 2015-01-06 NOTE — Progress Notes (Signed)
Dr. Jacelyn Grip office and Dr. Jillyn Hidden made aware of patient's low potassium level.

## 2015-01-06 NOTE — MAU Provider Note (Signed)
Chief Complaint:  Non-stress Test  First Provider Initiated Contact with Patient 01/06/15 1553     HPI: Stephanie Mack is a 24 y.o. G3P0110 at [redacted]w[redacted]d who presents to maternity admissions for oral potassium replacement. Serum Potassium was 3.0 today on pre-op labs for C/S tomorrow. Anesthesia wants pt to have total of 80 mEq KCl before then. Pt has been very non-compliant w/ therapy in the past so Dr. Jodi Mourning want her to get meds in MAU.   Denies contractions, leakage of fluid or vaginal bleeding. Good fetal movement.   Past Medical History: Past Medical History  Diagnosis Date  . Depression     depression  . Bipolar disorder ([place])   . ADHD (attention deficit hyperactivity disorder)   . Anxiety   . Anemia   . ADHD (attention deficit hyperactivity disorder)   . Bipolar 1 disorder ([place])   . Chlamydia 05-31-10  . Pseudocyesis 2013    Seen in MAU for percieved FM, abd distension. Normal exam.   . Gonorrhea contact, treated   . Headache   . Infection     UTI  . Gestational diabetes     current pregnancy  . GERD (gastroesophageal reflux disease)     with pregnancy  . Pregnancy induced hypertension     previous pregnancy    Past obstetric history: OB History  Gravida Para Term Preterm AB SAB TAB Ectopic Multiple Living  3 1 0 1 1 1 0 0 0 0     # Outcome Date GA Lbr Len/2nd Weight Sex Delivery Anes PTL Lv  3 Current           2 Preterm 09/01/12 [redacted]w[redacted]d   F CS-LTranv Spinal  N  1 SAB 09/16/09 [redacted]w[redacted]d            Comments: Twin gestational sacs.       Past Surgical History: Past Surgical History  Procedure Laterality Date  . Nasal septum surgery    . Cesarean section N/A 09/01/2012    Procedure:  Primary cesarean section with delivery of baby girl at 40.  Apgars 1/1.  ;  Surgeon: Frederico Hamman, MD;  Location: [facility];  Service: Obstetrics;  Laterality: N/A;     Family History: Family History  Problem Relation Age of Onset  . Kidney disease Mother   . Hypertension Father    . Cancer Sister   . Asthma Brother   . Heart disease Neg Hx   . Diabetes Maternal Grandfather     Social History: Social History  Substance Use Topics  . Smoking status: Never Smoker   . Smokeless tobacco: Never Used  . Alcohol Use: No    Allergies: No Known Allergies  Meds:  Prescriptions prior to admission  Medication Sig Dispense Refill Last Dose  . aspirin EC 81 MG tablet Take 81 mg by mouth daily.   Taking  . azithromycin (ZITHROMAX) 250 MG tablet Take 4 tablets (1,000 mg total) by mouth once. (Patient not taking: Reported on 01/06/2015) 4 tablet 0 Not Taking  . Blood Glucose Monitoring Suppl (ACCU-CHEK AVIVA) device Patient to check blood sugar fasting and 2 hours after each meal. 1 each 0 Taking  . cefixime (SUPRAX) 400 MG tablet Take 1 tablet (400 mg total) by mouth daily. (Patient not taking: Reported on 01/06/2015) 1 tablet 0 Not Taking  . clindamycin (CLEOCIN) 300 MG capsule Take 1 capsule (300 mg total) by mouth 3 (three) times daily. (Patient not taking: Reported on 01/06/2015) 21 capsule 0 Not Taking  .  ferrous sulfate (FERROUSUL) 325 (65 FE) MG tablet Take 1 tablet (325 mg total) by mouth 2 (two) times daily. 60 tablet 1 Taking  . glucose blood (ACCU-CHEK AVIVA) test strip Patient to check blood sugar fasting and 2 hours after each meal 100 each 12 Taking  . Lancets (ACCU-CHEK MULTICLIX) lancets Patient to check blood sugar fasting and 2 hours after each meal. 100 each 12 Taking  . metFORMIN (GLUCOPHAGE) 500 MG tablet Take 1 tablet (500 mg total) by mouth 2 (two) times daily with a meal. 60 tablet 5 Taking  . Prenat-FeFum-FePo-FA-Omega 3 (CONCEPT DHA) 53.5-38-1 MG CAPS Take 1 capsule by mouth daily. 30 capsule 11 Taking  . ranitidine (ZANTAC) 150 MG tablet Take 1 tablet (150 mg total) by mouth 2 (two) times daily. (Patient not taking: Reported on 12/31/2014) 60 tablet 11 Not Taking  . terconazole (TERAZOL 7) 0.4 % vaginal cream Place 1 applicator vaginally at bedtime.  (Patient not taking: Reported on 12/30/2014) 45 g 0 Not Taking  . [DISCONTINUED] potassium chloride SA (K-DUR,KLOR-CON) 20 MEQ tablet Take 1 tablet (20 mEq total) by mouth 2 (two) times daily. 14 tablet 0 Taking    I have reviewed patient's Past Medical Hx, Surgical Hx, Family Hx, Social Hx, medications and allergies.   ROS:  Review of Systems  Eyes: Negative for visual disturbance.  Gastrointestinal: Negative for abdominal pain.  Neurological: Negative for weakness and headaches.    Physical Exam   Patient Vitals for the past 24 hrs:  BP Temp Pulse Resp  01/06/15 1719 136/79 mmHg - 82 16  01/06/15 1600 145/82 mmHg 98.2 F (36.8 C) 91 16   Constitutional: Well-developed, well-nourished female in no acute distress.  Cardiovascular: normal rate Respiratory: normal effort GI: Gravid, size greater than dates  Neurologic: Alert and oriented x 4.    FHT:  Baseline 140 , moderate variability, accelerations present, no decelerations Contractions: Rare. mild   Labs: Results for orders placed or performed during the hospital encounter of 01/06/15 (from the past 24 hour(s))  Type and screen     Status: None (Preliminary result)   Collection Time: 01/06/15 10:25 AM  Result Value Ref Range   ABO/RH(D) B NEG    Antibody Screen POS    Sample Expiration 01/09/2015    DAT, IgG NEG    Antibody Identification PASSIVELY ACQUIRED ANTI-D    Unit Number [id number]    Blood Component Type RED CELLS,LR    Unit division 00    Status of Unit ALLOCATED    Transfusion Status OK TO TRANSFUSE    Crossmatch Result COMPATIBLE    Unit Number [id number]    Blood Component Type RED CELLS,LR    Unit division 00    Status of Unit ALLOCATED    Transfusion Status OK TO TRANSFUSE    Crossmatch Result COMPATIBLE   CBC     Status: Abnormal   Collection Time: 01/06/15 10:25 AM  Result Value Ref Range   WBC 6.0 4.0 - 10.5 K/uL   RBC 4.14 3.87 - 5.11 MIL/uL   Hemoglobin 10.3 (L) 12.0 - 15.0 g/dL    HCT 31.9 (L) 36.0 - 46.0 %   MCV 77.1 (L) 78.0 - 100.0 fL   MCH 24.9 (L) 26.0 - 34.0 pg   MCHC 32.3 30.0 - 36.0 g/dL   RDW 15.3 11.5 - 15.5 %   Platelets 177 150 - 400 K/uL  Basic metabolic panel     Status: Abnormal   Collection Time: 01/06/15 10:25 AM  Result Value Ref Range   Sodium 136 135 - 145 mmol/L   Potassium 3.0 (L) 3.5 - 5.1 mmol/L   Chloride 108 101 - 111 mmol/L   CO2 22 22 - 32 mmol/L   Glucose, Bld 90 65 - 99 mg/dL   BUN 6 6 - 20 mg/dL   Creatinine, Ser 0.51 0.44 - 1.00 mg/dL   Calcium 8.3 (L) 8.9 - 10.3 mg/dL   GFR calc non Af Amer >60 >60 mL/min   GFR calc Af Amer >60 >60 mL/min   Anion gap 6 5 - 15    Imaging:  NA  MAU Course: Potassium chloride 40 mEq and Zofran given. We'll try to give second dose of potassium chloride 40 mEq in 30 minutes if patient can tolerate.  Patient reports nausea, no vomiting, but doesn't think she can keep down a second dose of potassium right now.  Initial blood pressure elevated, but repeat blood pressure normal. No preeclampsia symptoms. No history of hypertension. Discussed with Dr. Jodi Mourning. Do not need to add preeclampsia lab works due to this transient hypertension. Will reassess when she comes for surgery in the morning.  MDM: 24 year old female at 35 weeks 6 days presents to maternity admissions for by mouth potassium replacement due to concern for noncompliance. Patient is asymptomatic. Patient was found to have transient mildly elevated blood pressure without evidence of preeclampsia. No further workup needed.   Assessment: 1. Hypokalemia   2. Transient hypertension of pregnancy in third trimester     Plan: Discharge home in stable condition.  Labor precautions and fetal kick counts Preeclampsia precautions.     Follow-up Information    Follow up with [facility] On 01/07/2015.   Contact information:   [address] [id number] [place] [phone]       Follow up with [facility].   Why:  As needed in emergencies   Contact information:   [address] [id number] [place] [phone]        Medication List    STOP taking these medications        azithromycin 250 MG tablet  Commonly known as:  ZITHROMAX     cefixime 400 MG tablet  Commonly known as:  SUPRAX      TAKE these medications        ACCU-CHEK AVIVA device  Patient to check blood sugar fasting and 2 hours after each meal.     accu-chek multiclix lancets  Patient to check blood sugar fasting and 2 hours after each meal.     aspirin EC 81 MG tablet  Take 81 mg by mouth daily.     clindamycin 300 MG capsule  Commonly known as:  CLEOCIN  Take 1 capsule (300 mg total) by mouth 3 (three) times daily.     CONCEPT DHA 53.5-38-1 MG Caps  Take 1 capsule by mouth daily.     ferrous sulfate 325 (65 FE) MG tablet  Commonly known as:  FERROUSUL  Take 1 tablet (325 mg total) by mouth 2 (two) times daily.     glucose blood test strip  Commonly known as:  ACCU-CHEK AVIVA  Patient to check blood sugar fasting and 2 hours after each meal     metFORMIN 500 MG tablet  Commonly known as:  GLUCOPHAGE  Take 1 tablet (500 mg total) by mouth 2 (two) times daily with a meal.     potassium chloride  SA 20 MEQ tablet  Commonly known as:  K-DUR,KLOR-CON  Take 2 tablets (40 mEq total) by mouth once.     ranitidine 150 MG tablet  Commonly known as:  ZANTAC  Take 1 tablet (150 mg total) by mouth 2 (two) times daily.     terconazole 0.4 % vaginal cream  Commonly known as:  TERAZOL 7  Place 1 applicator vaginally at bedtime.        [place], CNM 01/06/2015 5:15 PM

## 2015-01-07 ENCOUNTER — Inpatient Hospital Stay (HOSPITAL_COMMUNITY): Payer: Medicaid Other | Admitting: Certified Registered Nurse Anesthetist

## 2015-01-07 ENCOUNTER — Encounter (HOSPITAL_COMMUNITY)
Admission: RE | Disposition: A | Discharge status: Home / Self Care | Payer: Self-pay | Source: Ambulatory Visit | Attending: Obstetrics

## 2015-01-07 ENCOUNTER — Other Ambulatory Visit (HOSPITAL_COMMUNITY): Payer: Medicaid Other

## 2015-01-07 ENCOUNTER — Inpatient Hospital Stay (HOSPITAL_COMMUNITY)
Admission: RE | Admit: 2015-01-07 | Discharge: 2015-01-10 | DRG: 766 | Disposition: A | Discharge status: Home / Self Care | Payer: Medicaid Other | Source: Ambulatory Visit | Attending: Obstetrics | Admitting: Obstetrics

## 2015-01-07 ENCOUNTER — Encounter (HOSPITAL_COMMUNITY): Payer: Self-pay | Admitting: *Deleted

## 2015-01-07 DIAGNOSIS — Z3A36 36 weeks gestation of pregnancy: Secondary | ICD-10-CM | POA: Diagnosis not present

## 2015-01-07 DIAGNOSIS — O24429 Gestational diabetes mellitus in childbirth, unspecified control: Secondary | ICD-10-CM | POA: Diagnosis present

## 2015-01-07 DIAGNOSIS — O164 Unspecified maternal hypertension, complicating childbirth: Secondary | ICD-10-CM | POA: Diagnosis not present

## 2015-01-07 DIAGNOSIS — F419 Anxiety disorder, unspecified: Secondary | ICD-10-CM | POA: Diagnosis present

## 2015-01-07 DIAGNOSIS — O99824 Streptococcus B carrier state complicating childbirth: Secondary | ICD-10-CM | POA: Diagnosis present

## 2015-01-07 DIAGNOSIS — E876 Hypokalemia: Secondary | ICD-10-CM | POA: Diagnosis present

## 2015-01-07 DIAGNOSIS — O26893 Other specified pregnancy related conditions, third trimester: Secondary | ICD-10-CM | POA: Diagnosis not present

## 2015-01-07 DIAGNOSIS — Z7982 Long term (current) use of aspirin: Secondary | ICD-10-CM | POA: Diagnosis not present

## 2015-01-07 DIAGNOSIS — F4310 Post-traumatic stress disorder, unspecified: Secondary | ICD-10-CM | POA: Diagnosis present

## 2015-01-07 DIAGNOSIS — F329 Major depressive disorder, single episode, unspecified: Secondary | ICD-10-CM | POA: Diagnosis present

## 2015-01-07 DIAGNOSIS — Z809 Family history of malignant neoplasm, unspecified: Secondary | ICD-10-CM | POA: Diagnosis not present

## 2015-01-07 DIAGNOSIS — Z833 Family history of diabetes mellitus: Secondary | ICD-10-CM | POA: Diagnosis not present

## 2015-01-07 DIAGNOSIS — K219 Gastro-esophageal reflux disease without esophagitis: Secondary | ICD-10-CM | POA: Diagnosis present

## 2015-01-07 DIAGNOSIS — O99344 Other mental disorders complicating childbirth: Secondary | ICD-10-CM | POA: Diagnosis present

## 2015-01-07 DIAGNOSIS — Z8249 Family history of ischemic heart disease and other diseases of the circulatory system: Secondary | ICD-10-CM | POA: Diagnosis not present

## 2015-01-07 DIAGNOSIS — O9962 Diseases of the digestive system complicating childbirth: Secondary | ICD-10-CM | POA: Diagnosis present

## 2015-01-07 DIAGNOSIS — Z98891 History of uterine scar from previous surgery: Secondary | ICD-10-CM

## 2015-01-07 DIAGNOSIS — O134 Gestational [pregnancy-induced] hypertension without significant proteinuria, complicating childbirth: Secondary | ICD-10-CM | POA: Diagnosis present

## 2015-01-07 DIAGNOSIS — O133 Gestational [pregnancy-induced] hypertension without significant proteinuria, third trimester: Secondary | ICD-10-CM | POA: Diagnosis not present

## 2015-01-07 DIAGNOSIS — O34211 Maternal care for low transverse scar from previous cesarean delivery: Secondary | ICD-10-CM | POA: Diagnosis not present

## 2015-01-07 LAB — GLUCOSE, CAPILLARY
Glucose-Capillary: 102 mg/dL — ABNORMAL HIGH (ref 65–99)
Glucose-Capillary: 115 mg/dL — ABNORMAL HIGH (ref 65–99)

## 2015-01-07 LAB — RPR: RPR: NONREACTIVE

## 2015-01-07 SURGERY — Surgical Case
Anesthesia: Spinal

## 2015-01-07 MED ORDER — MORPHINE SULFATE (PF) 0.5 MG/ML IJ SOLN
INTRAMUSCULAR | Status: DC | PRN
  Administered 2015-01-07: .2 mg via INTRATHECAL

## 2015-01-07 MED ORDER — INFLUENZA VAC SPLIT QUAD 0.5 ML IM SUSY
0.5000 mL | PREFILLED_SYRINGE | INTRAMUSCULAR | Status: AC
  Administered 2015-01-09: 0.5 mL via INTRAMUSCULAR
  Filled 2015-01-07: qty 0.5

## 2015-01-07 MED ORDER — PHENYLEPHRINE 8 MG IN D5W 100 ML (0.08MG/ML) PREMIX OPTIME
INJECTION | INTRAVENOUS | Status: DC | PRN
  Administered 2015-01-07: 60 ug/min via INTRAVENOUS

## 2015-01-07 MED ORDER — MEPERIDINE HCL 25 MG/ML IJ SOLN: 6.2500 mg | INTRAMUSCULAR | Status: DC | PRN

## 2015-01-07 MED ORDER — SCOPOLAMINE 1 MG/3DAYS TD PT72
1.0000 | MEDICATED_PATCH | Freq: Once | TRANSDERMAL | Status: DC
  Administered 2015-01-07: 1.5 mg via TRANSDERMAL

## 2015-01-07 MED ORDER — OXYTOCIN 40 UNITS IN LACTATED RINGERS INFUSION - SIMPLE MED: 62.5000 mL/h | INTRAVENOUS | Status: AC

## 2015-01-07 MED ORDER — ONDANSETRON HCL 4 MG/2ML IJ SOLN
INTRAMUSCULAR | Status: AC
  Filled 2015-01-07: qty 2

## 2015-01-07 MED ORDER — KETOROLAC TROMETHAMINE 30 MG/ML IJ SOLN
30.0000 mg | Freq: Once | INTRAMUSCULAR | Status: AC | PRN
  Administered 2015-01-07: 30 mg via INTRAVENOUS

## 2015-01-07 MED ORDER — NALBUPHINE HCL 10 MG/ML IJ SOLN: 5.0000 mg | INTRAMUSCULAR | Status: DC | PRN

## 2015-01-07 MED ORDER — ZOLPIDEM TARTRATE 5 MG PO TABS: 5.0000 mg | ORAL_TABLET | Freq: Every evening | ORAL | Status: DC | PRN

## 2015-01-07 MED ORDER — DIBUCAINE 1 % RE OINT: 1.0000 "application " | TOPICAL_OINTMENT | RECTAL | Status: DC | PRN

## 2015-01-07 MED ORDER — DIPHENHYDRAMINE HCL 50 MG/ML IJ SOLN: 12.5000 mg | INTRAMUSCULAR | Status: DC | PRN

## 2015-01-07 MED ORDER — MORPHINE SULFATE (PF) 0.5 MG/ML IJ SOLN
INTRAMUSCULAR | Status: AC
  Filled 2015-01-07: qty 100

## 2015-01-07 MED ORDER — LACTATED RINGERS IV SOLN
INTRAVENOUS | Status: DC | PRN
  Administered 2015-01-07: 10:00:00 via INTRAVENOUS

## 2015-01-07 MED ORDER — CEFAZOLIN SODIUM-DEXTROSE 2-3 GM-% IV SOLR
INTRAVENOUS | Status: AC
  Filled 2015-01-07: qty 50

## 2015-01-07 MED ORDER — SCOPOLAMINE 1 MG/3DAYS TD PT72
MEDICATED_PATCH | TRANSDERMAL | Status: AC
  Administered 2015-01-07: 1.5 mg via TRANSDERMAL
  Filled 2015-01-07: qty 1

## 2015-01-07 MED ORDER — ACETAMINOPHEN 325 MG PO TABS
650.0000 mg | ORAL_TABLET | ORAL | Status: DC | PRN
  Filled 2015-01-07: qty 2

## 2015-01-07 MED ORDER — KETOROLAC TROMETHAMINE 30 MG/ML IJ SOLN: 30.0000 mg | Freq: Four times a day (QID) | INTRAMUSCULAR | Status: DC | PRN

## 2015-01-07 MED ORDER — DIPHENHYDRAMINE HCL 25 MG PO CAPS
25.0000 mg | ORAL_CAPSULE | Freq: Four times a day (QID) | ORAL | Status: DC | PRN
  Administered 2015-01-08 – 2015-01-09 (×3): 25 mg via ORAL
  Filled 2015-01-07 (×3): qty 1

## 2015-01-07 MED ORDER — SENNOSIDES-DOCUSATE SODIUM 8.6-50 MG PO TABS
2.0000 | ORAL_TABLET | ORAL | Status: DC
  Administered 2015-01-07 – 2015-01-10 (×3): 2 via ORAL
  Filled 2015-01-07 (×3): qty 2

## 2015-01-07 MED ORDER — SODIUM CHLORIDE 0.9 % IR SOLN
Status: DC | PRN
  Administered 2015-01-07: 1000 mL

## 2015-01-07 MED ORDER — BUPIVACAINE IN DEXTROSE 0.75-8.25 % IT SOLN
INTRATHECAL | Status: DC | PRN
  Administered 2015-01-07: 1.2 mL via INTRATHECAL

## 2015-01-07 MED ORDER — SIMETHICONE 80 MG PO CHEW
80.0000 mg | CHEWABLE_TABLET | ORAL | Status: DC
  Administered 2015-01-07 – 2015-01-10 (×3): 80 mg via ORAL
  Filled 2015-01-07 (×3): qty 1

## 2015-01-07 MED ORDER — PRENATAL MULTIVITAMIN CH
1.0000 | ORAL_TABLET | Freq: Every day | ORAL | Status: DC
  Administered 2015-01-08 – 2015-01-10 (×3): 1 via ORAL
  Filled 2015-01-07 (×3): qty 1

## 2015-01-07 MED ORDER — OXYCODONE-ACETAMINOPHEN 5-325 MG PO TABS
1.0000 | ORAL_TABLET | ORAL | Status: DC | PRN
  Administered 2015-01-07 – 2015-01-10 (×6): 1 via ORAL
  Filled 2015-01-07 (×6): qty 1

## 2015-01-07 MED ORDER — HYDROMORPHONE HCL 1 MG/ML IJ SOLN: 0.2500 mg | INTRAMUSCULAR | Status: DC | PRN

## 2015-01-07 MED ORDER — OXYTOCIN 10 UNIT/ML IJ SOLN
40.0000 [IU] | INTRAVENOUS | Status: DC | PRN
  Administered 2015-01-07: 40 [IU] via INTRAVENOUS

## 2015-01-07 MED ORDER — IBUPROFEN 600 MG PO TABS
600.0000 mg | ORAL_TABLET | Freq: Four times a day (QID) | ORAL | Status: DC
  Administered 2015-01-07 – 2015-01-10 (×11): 600 mg via ORAL
  Filled 2015-01-07 (×13): qty 1

## 2015-01-07 MED ORDER — OXYCODONE-ACETAMINOPHEN 5-325 MG PO TABS
2.0000 | ORAL_TABLET | ORAL | Status: DC | PRN
  Administered 2015-01-08 – 2015-01-09 (×3): 2 via ORAL
  Filled 2015-01-07 (×3): qty 2

## 2015-01-07 MED ORDER — SIMETHICONE 80 MG PO CHEW: 80.0000 mg | CHEWABLE_TABLET | ORAL | Status: DC | PRN

## 2015-01-07 MED ORDER — FENTANYL CITRATE (PF) 100 MCG/2ML IJ SOLN
INTRAMUSCULAR | Status: DC | PRN
  Administered 2015-01-07: 20 ug via INTRATHECAL

## 2015-01-07 MED ORDER — WITCH HAZEL-GLYCERIN EX PADS: 1.0000 "application " | MEDICATED_PAD | CUTANEOUS | Status: DC | PRN

## 2015-01-07 MED ORDER — NALBUPHINE HCL 10 MG/ML IJ SOLN: 5.0000 mg | Freq: Once | INTRAMUSCULAR | Status: AC | PRN

## 2015-01-07 MED ORDER — TETANUS-DIPHTH-ACELL PERTUSSIS 5-2.5-18.5 LF-MCG/0.5 IM SUSP
0.5000 mL | Freq: Once | INTRAMUSCULAR | Status: AC
  Administered 2015-01-08: 0.5 mL via INTRAMUSCULAR
  Filled 2015-01-07: qty 0.5

## 2015-01-07 MED ORDER — SODIUM CHLORIDE 0.9 % IJ SOLN: 3.0000 mL | INTRAMUSCULAR | Status: DC | PRN

## 2015-01-07 MED ORDER — ONDANSETRON HCL 4 MG/2ML IJ SOLN
INTRAMUSCULAR | Status: DC | PRN
  Administered 2015-01-07: 4 mg via INTRAVENOUS

## 2015-01-07 MED ORDER — FENTANYL CITRATE (PF) 100 MCG/2ML IJ SOLN
INTRAMUSCULAR | Status: AC
  Filled 2015-01-07: qty 4

## 2015-01-07 MED ORDER — NALOXONE HCL 0.4 MG/ML IJ SOLN: 0.4000 mg | INTRAMUSCULAR | Status: DC | PRN

## 2015-01-07 MED ORDER — OXYTOCIN 10 UNIT/ML IJ SOLN
INTRAMUSCULAR | Status: AC
  Filled 2015-01-07: qty 4

## 2015-01-07 MED ORDER — ONDANSETRON HCL 4 MG/2ML IJ SOLN: 4.0000 mg | Freq: Three times a day (TID) | INTRAMUSCULAR | Status: DC | PRN

## 2015-01-07 MED ORDER — LANOLIN HYDROUS EX OINT: 1.0000 "application " | TOPICAL_OINTMENT | CUTANEOUS | Status: DC | PRN

## 2015-01-07 MED ORDER — MENTHOL 3 MG MT LOZG: 1.0000 | LOZENGE | OROMUCOSAL | Status: DC | PRN

## 2015-01-07 MED ORDER — LACTATED RINGERS IV SOLN
INTRAVENOUS | Status: DC
  Administered 2015-01-07: 08:00:00 via INTRAVENOUS

## 2015-01-07 MED ORDER — PHENYLEPHRINE 8 MG IN D5W 100 ML (0.08MG/ML) PREMIX OPTIME
INJECTION | INTRAVENOUS | Status: AC
  Filled 2015-01-07: qty 100

## 2015-01-07 MED ORDER — IBUPROFEN 600 MG PO TABS: 600.0000 mg | ORAL_TABLET | Freq: Four times a day (QID) | ORAL | Status: DC | PRN

## 2015-01-07 MED ORDER — NALBUPHINE HCL 10 MG/ML IJ SOLN
5.0000 mg | Freq: Once | INTRAMUSCULAR | Status: AC | PRN
  Administered 2015-01-07: 5 mg via SUBCUTANEOUS
  Filled 2015-01-07: qty 1

## 2015-01-07 MED ORDER — DIPHENHYDRAMINE HCL 25 MG PO CAPS
25.0000 mg | ORAL_CAPSULE | ORAL | Status: DC | PRN
  Filled 2015-01-07: qty 1

## 2015-01-07 MED ORDER — CEFAZOLIN SODIUM-DEXTROSE 2-3 GM-% IV SOLR
2.0000 g | Freq: Once | INTRAVENOUS | Status: AC
  Administered 2015-01-07: 2 g via INTRAVENOUS

## 2015-01-07 MED ORDER — PROMETHAZINE HCL 25 MG/ML IJ SOLN: 6.2500 mg | INTRAMUSCULAR | Status: DC | PRN

## 2015-01-07 MED ORDER — KETOROLAC TROMETHAMINE 30 MG/ML IJ SOLN
INTRAMUSCULAR | Status: AC
  Filled 2015-01-07: qty 1

## 2015-01-07 MED ORDER — NALOXONE HCL 1 MG/ML IJ SOLN
1.0000 ug/kg/h | INTRAVENOUS | Status: DC | PRN
  Filled 2015-01-07: qty 2

## 2015-01-07 MED ORDER — LACTATED RINGERS IV SOLN
INTRAVENOUS | Status: DC
  Administered 2015-01-07 – 2015-01-08 (×2): via INTRAVENOUS

## 2015-01-07 MED ORDER — SIMETHICONE 80 MG PO CHEW
80.0000 mg | CHEWABLE_TABLET | Freq: Three times a day (TID) | ORAL | Status: DC
  Administered 2015-01-07 – 2015-01-10 (×8): 80 mg via ORAL
  Filled 2015-01-07 (×8): qty 1

## 2015-01-07 SURGICAL SUPPLY — 35 items
CLAMP CORD UMBIL (MISCELLANEOUS) IMPLANT
CLOTH BEACON ORANGE TIMEOUT ST (SAFETY) ×3 IMPLANT
CONTAINER PREFILL 10% NBF 15ML (MISCELLANEOUS) ×6 IMPLANT
DRAPE SHEET LG 3/4 BI-LAMINATE (DRAPES) IMPLANT
DRSG OPSITE POSTOP 4X10 (GAUZE/BANDAGES/DRESSINGS) ×3 IMPLANT
DURAPREP 26ML APPLICATOR (WOUND CARE) ×3 IMPLANT
ELECT REM PT RETURN 9FT ADLT (ELECTROSURGICAL) ×3
ELECTRODE REM PT RTRN 9FT ADLT (ELECTROSURGICAL) ×1 IMPLANT
EXTRACTOR VACUUM M CUP 4 TUBE (SUCTIONS) IMPLANT
EXTRACTOR VACUUM M CUP 4' TUBE (SUCTIONS)
GLOVE BIO SURGEON STRL SZ8 (GLOVE) ×3 IMPLANT
GOWN STRL REUS W/TWL LRG LVL3 (GOWN DISPOSABLE) ×6 IMPLANT
KIT ABG SYR 3ML LUER SLIP (SYRINGE) IMPLANT
LIQUID BAND (GAUZE/BANDAGES/DRESSINGS) ×3 IMPLANT
NEEDLE HYPO 22GX1.5 SAFETY (NEEDLE) ×3 IMPLANT
NEEDLE HYPO 25X5/8 SAFETYGLIDE (NEEDLE) ×3 IMPLANT
NS IRRIG 1000ML POUR BTL (IV SOLUTION) ×3 IMPLANT
PACK C SECTION WH (CUSTOM PROCEDURE TRAY) ×3 IMPLANT
PAD OB MATERNITY 4.3X12.25 (PERSONAL CARE ITEMS) ×3 IMPLANT
PENCIL SMOKE EVAC W/HOLSTER (ELECTROSURGICAL) ×3 IMPLANT
RTRCTR C-SECT PINK 25CM LRG (MISCELLANEOUS) ×3 IMPLANT
STAPLER VISISTAT 35W (STAPLE) ×3 IMPLANT
SUT GUT PLAIN 0 CT-3 TAN 27 (SUTURE) IMPLANT
SUT MNCRL 0 VIOLET CTX 36 (SUTURE) ×3 IMPLANT
SUT MNCRL AB 4-0 PS2 18 (SUTURE) IMPLANT
SUT MON AB 2-0 CT1 27 (SUTURE) ×3 IMPLANT
SUT MON AB 3-0 SH 27 (SUTURE)
SUT MON AB 3-0 SH27 (SUTURE) IMPLANT
SUT MONOCRYL 0 CTX 36 (SUTURE) ×6
SUT PLAIN 2 0 XLH (SUTURE) IMPLANT
SUT VIC AB 0 CTX 36 (SUTURE) ×6
SUT VIC AB 0 CTX36XBRD ANBCTRL (SUTURE) ×2 IMPLANT
SYR CONTROL 10ML LL (SYRINGE) ×3 IMPLANT
TOWEL OR 17X24 6PK STRL BLUE (TOWEL DISPOSABLE) ×3 IMPLANT
TRAY FOLEY CATH SILVER 14FR (SET/KITS/TRAYS/PACK) ×3 IMPLANT

## 2015-01-07 NOTE — Anesthesia Preprocedure Evaluation (Signed)
Anesthesia Evaluation  Patient identified by MRN, date of birth, ID band Patient awake    Reviewed: Allergy & Precautions, H&P , NPO status , Patient's Chart, lab work & pertinent test results  Airway Mallampati: I  TM Distance: >3 FB Neck ROM: full    Dental no notable dental hx.    Pulmonary neg pulmonary ROS,    Pulmonary exam normal        Cardiovascular hypertension, Normal cardiovascular exam     Neuro/Psych    GI/Hepatic negative GI ROS, Neg liver ROS,   Endo/Other  diabetes  Renal/GU negative Renal ROS     Musculoskeletal   Abdominal Normal abdominal exam  (+)   Peds  Hematology negative hematology ROS (+)   Anesthesia Other Findings   Reproductive/Obstetrics (+) Pregnancy                             Anesthesia Physical Anesthesia Plan  ASA: II  Anesthesia Plan: Spinal   Post-op Pain Management:    Induction:   Airway Management Planned:   Additional Equipment:   Intra-op Plan:   Post-operative Plan:   Informed Consent: I have reviewed the patients History and Physical, chart, labs and discussed the procedure including the risks, benefits and alternatives for the proposed anesthesia with the patient or authorized representative who has indicated his/her understanding and acceptance.     Plan Discussed with: CRNA and Surgeon  Anesthesia Plan Comments:         Anesthesia Quick Evaluation

## 2015-01-07 NOTE — Op Note (Signed)
Cesarean Section Procedure Note   Stephanie Mack    10/5/2014C  Indications: Scheduled Proceedure/Maternal Request and PREVIOUS CLASSICAL C/S   Pre-operative Diagnosis: PREV C/S, CLASSICAL UTERINE  INCISION  Post-operative Diagnosis: Same   Procedure:  Repeat LTCS  Surgeon: Lydiann Bonifas A  Assistants: MARSHALL, BERNARD A.  Anesthesia: spinal  Procedure Details:  The patient was seen in the Holding Room. The risks, benefits, complications, treatment options, and expected outcomes were discussed with the patient. The patient concurred with the proposed plan, giving informed consent. The patient was identified as Stephanie Mack and the procedure verified as C-Section Delivery. A Time Out was held and the above information confirmed.  After induction of anesthesia, the patient was draped and prepped in the usual sterile manner. A transverse incision was made and carried down through the subcutaneous tissue to the fascia. The fascial incision was made and extended transversely. The fascia was separated from the underlying rectus tissue superiorly and inferiorly. The peritoneum was identified and entered. The peritoneal incision was extended longitudinally. The utero-vesical peritoneal reflection was incised transversely and the bladder flap was bluntly freed from the lower uterine segment. A low transverse uterine incision was made. Delivered from cephalic presentation was a 2829 gram living newborn female infant(s). APGAR (1 MIN): 9   APGAR (5 MINS): 9   APGAR (10 MINS):    A cord ph was not sent. The umbilical cord was clamped and cut cord. A sample was obtained for evaluation. The placenta was removed Intact and appeared normal.  The uterine incision was closed with running locked sutures of 0 Monocryl. A second imbricating layer of the same suture was placed.  Hemostasis was observed. The paracolic gutters were irrigated. The parieto peritoneum was closed in a running fashion with 2-0  Vicryl.  The fascia was then reapproximated with running sutures of 0 VICRYL.  The skin was closed with staples.  Instrument, sponge, and needle counts were correct prior the abdominal closure and were correct at the conclusion of the case.    Findings: Normal uterus, ovaries and tubes.   Estimated Blood Loss: 660ml  Total IV Fluids: 2551ml   Urine Output: 300CC OF clear urine  Specimens: Placenta to Pathology  Complications: no complications  Disposition: PACU - hemodynamically stable.  Maternal Condition: stable   Baby condition / location:  Couplet care / Skin to Skin    Signed: Surgeon(s): Shelly Bombard, MD Frederico Hamman, MD

## 2015-01-07 NOTE — Anesthesia Postprocedure Evaluation (Signed)
  Anesthesia Post-op Note  Patient: Stephanie Mack  Procedure(s) Performed: Procedure(s): REPEAT CESAREAN SECTION (N/A)  Patient Location: Mother/Baby  Anesthesia Type:Spinal  Level of Consciousness: awake  Airway and Oxygen Therapy: Patient Spontanous Breathing  Post-op Pain: mild  Post-op Assessment: Patient's Cardiovascular Status Stable and Respiratory Function Stable LLE Motor Response: Purposeful movement LLE Sensation: Tingling, Full sensation RLE Motor Response: Purposeful movement RLE Sensation: Tingling, Full sensation      Post-op Vital Signs: stable  Last Vitals:  Filed Vitals:   01/07/15 1253  BP: 148/82  Pulse: 80  Temp: 36.5 C  Resp: 17    Complications: No apparent anesthesia complications

## 2015-01-07 NOTE — Anesthesia Postprocedure Evaluation (Signed)
Anesthesia Post Note  Patient: Stephanie Mack  Procedure(s) Performed: Procedure(s) (LRB): REPEAT CESAREAN SECTION (N/A)  Anesthesia type: Spinal  Patient location: PACU  Post pain: Pain level controlled  Post assessment: Post-op Vital signs reviewed  Last Vitals:  Filed Vitals:   01/07/15 1115  BP:   Pulse: 75  Temp:   Resp: 20    Post vital signs: Reviewed  Level of consciousness: awake  Complications: No apparent anesthesia complications

## 2015-01-07 NOTE — Transfer of Care (Signed)
Immediate Anesthesia Transfer of Care Note  Patient: Stephanie Mack  Procedure(s) Performed: Procedure(s): REPEAT CESAREAN SECTION (N/A)  Patient Location: PACU  Anesthesia Type:Spinal  Level of Consciousness: awake, alert  and oriented  Airway & Oxygen Therapy: Patient Spontanous Breathing  Post-op Assessment: Report given to RN and Post -op Vital signs reviewed and stable  Post vital signs: Reviewed and stable  Last Vitals:  Filed Vitals:   01/07/15 0757  BP: 140/97  Pulse: 80  Temp: 36.5 C  Resp: 20    Complications: No apparent anesthesia complications

## 2015-01-07 NOTE — Anesthesia Procedure Notes (Signed)
Spinal Patient location during procedure: OR Start time: 01/07/2015 9:23 AM End time: 01/07/2015 9:25 AM Staffing Anesthesiologist: Lyn Hollingshead Performed by: anesthesiologist  Preanesthetic Checklist Completed: patient identified, surgical consent, pre-op evaluation, timeout performed, IV checked, risks and benefits discussed and monitors and equipment checked Spinal Block Patient position: sitting Prep: site prepped and draped and DuraPrep Patient monitoring: heart rate, cardiac monitor, continuous pulse ox and blood pressure Approach: midline Location: L3-4 Injection technique: single-shot Needle Needle type: Pencan  Needle gauge: 24 G Needle length: 9 cm Needle insertion depth: 5 cm Assessment Sensory level: T4

## 2015-01-07 NOTE — Addendum Note (Signed)
Addendum  created 01/07/15 1344 by Ignacia Bayley, CRNA   Modules edited: Notes Section   Notes Section:  File: [id number]

## 2015-01-07 NOTE — H&P (Signed)
Stephanie Mack is a 24 y.o. female presenting for repeat C/S. Maternal Medical History:  Reason for admission: Previous classical C/S.  For repeat C/S.  Fetal activity: Perceived fetal activity is normal.   Last perceived fetal movement was within the past hour.    Prenatal complications: no prenatal complications   OB History    Gravida Para Term Preterm AB TAB SAB Ectopic Multiple Living   3 1 0 1 1 0 1 0 0 0      Past Medical History  Diagnosis Date  . Depression     depression  . Bipolar disorder ([place])   . ADHD (attention deficit hyperactivity disorder)   . Anxiety   . Anemia   . ADHD (attention deficit hyperactivity disorder)   . Bipolar 1 disorder ([place])   . Chlamydia 05-31-10  . Pseudocyesis 2013    Seen in MAU for percieved FM, abd distension. Normal exam.   . Gonorrhea contact, treated   . Headache   . Infection     UTI  . Gestational diabetes     current pregnancy  . GERD (gastroesophageal reflux disease)     with pregnancy  . Pregnancy induced hypertension     previous pregnancy   Past Surgical History  Procedure Laterality Date  . Nasal septum surgery    . Cesarean section N/A 09/01/2012    Procedure:  Primary cesarean section with delivery of baby girl at 59.  Apgars 1/1.  ;  Surgeon: Frederico Hamman, MD;  Location: [facility];  Service: Obstetrics;  Laterality: N/A;   Family History: family history includes Asthma in her brother; Cancer in her sister; Diabetes in her maternal grandfather; Hypertension in her father; Kidney disease in her mother. There is no history of Heart disease. Social History:  reports that she has never smoked. She has never used smokeless tobacco. She reports that she does not drink alcohol or use illicit drugs.   Prenatal Transfer Tool  Maternal Diabetes: No Genetic Screening: Normal Maternal Ultrasounds/Referrals: Normal Fetal Ultrasounds or other Referrals:  Referred to Materal Fetal Medicine  Maternal Substance Abuse:   No Significant Maternal Medications:  None Significant Maternal Lab Results:  None Other Comments:  None  Review of Systems  All other systems reviewed and are negative.     Blood pressure 140/97, pulse 80, temperature 97.7 F (36.5 C), temperature source Oral, resp. rate 20, last menstrual period 04/30/2014, SpO2 100 %. Exam Physical Exam  Nursing note and vitals reviewed. Constitutional: She is oriented to person, place, and time. She appears well-developed and well-nourished.  HENT:  Head: Normocephalic and atraumatic.  Eyes: Conjunctivae are normal. Pupils are equal, round, and reactive to light.  Neck: Normal range of motion. Neck supple.  Cardiovascular: Normal rate and regular rhythm.   Respiratory: Effort normal.  GI: Soft.  Musculoskeletal: Normal range of motion.  Neurological: She is alert and oriented to person, place, and time.  Skin: Skin is warm and dry.  Psychiatric: She has a normal mood and affect. Her behavior is normal. Judgment and thought content normal.    Prenatal labs: ABO, Rh: --/--/B NEG (10/04 1025) Antibody: POS (10/04 1025) Rubella: 0.56 (04/19 1329) RPR: Non Reactive (10/04 1025)  HBsAg: NEGATIVE (04/19 1329)  HIV: NONREACTIVE (08/05 1304)  GBS:     Assessment/Plan: 36 weeks.  Previous classical C/S.  Presents for repeat C/S.   HARPER,CHARLES A 01/07/2015, 8:13 AM

## 2015-01-08 ENCOUNTER — Encounter (HOSPITAL_COMMUNITY): Payer: Self-pay | Admitting: Obstetrics

## 2015-01-08 ENCOUNTER — Encounter: Payer: Medicaid Other | Admitting: Obstetrics

## 2015-01-08 LAB — CBC
HCT: 26.8 % — ABNORMAL LOW (ref 36.0–46.0)
HEMOGLOBIN: 8.4 g/dL — AB (ref 12.0–15.0)
MCH: 24.4 pg — ABNORMAL LOW (ref 26.0–34.0)
MCHC: 31.3 g/dL (ref 30.0–36.0)
MCV: 77.9 fL — ABNORMAL LOW (ref 78.0–100.0)
PLATELETS: 153 10*3/uL (ref 150–400)
RBC: 3.44 MIL/uL — ABNORMAL LOW (ref 3.87–5.11)
RDW: 15.6 % — AB (ref 11.5–15.5)
WBC: 8.7 10*3/uL (ref 4.0–10.5)

## 2015-01-08 MED ORDER — FERROUS SULFATE 325 (65 FE) MG PO TABS
325.0000 mg | ORAL_TABLET | Freq: Three times a day (TID) | ORAL | Status: DC
  Administered 2015-01-08 – 2015-01-10 (×7): 325 mg via ORAL
  Filled 2015-01-08 (×7): qty 1

## 2015-01-08 MED ORDER — RHO D IMMUNE GLOBULIN 1500 UNIT/2ML IJ SOSY
300.0000 ug | PREFILLED_SYRINGE | Freq: Once | INTRAMUSCULAR | Status: AC
  Administered 2015-01-08: 300 ug via INTRAMUSCULAR
  Filled 2015-01-08: qty 2

## 2015-01-08 NOTE — Progress Notes (Signed)
Subjective: Postpartum Day #1: Cesarean Delivery Patient reports incisional pain, tolerating PO and no problems voiding.    Objective: Vital signs in last 24 hours: Temp:  [97.3 F (36.3 C)-98.2 F (36.8 C)] 98 F (36.7 C) (10/06 0430) Pulse Rate:  [73-91] 77 (10/06 0430) Resp:  [17-20] 18 (10/06 0430) BP: (118-148)/(60-97) 121/68 mmHg (10/06 0430) SpO2:  [96 %-100 %] 100 % (10/06 0430)  Physical Exam:  General: alert, cooperative and no distress Lochia: appropriate Uterine Fundus: firm Incision: no significant drainage DVT Evaluation: No evidence of DVT seen on physical exam. No cords or calf tenderness.   Recent Labs  01/06/15 1025 01/08/15 0635  HGB 10.3* 8.4*  HCT 31.9* 26.8*    Assessment/Plan: Status post Cesarean section. Doing well postoperatively.  Anemia Continue current care.  Stephanie Mack A Stephanie Mack 01/08/2015, 8:33 AM

## 2015-01-08 NOTE — Progress Notes (Signed)
MSW intern and CSW were able to introduce themselves to MOB and MOB's mother but were not able to initiate the assessment because a visitor came in the room and MOB's mother asked [place] to come back. CSW consulted with MOB's nurse about the patient and how she had been interacting with the infant and if there were any concerns in regard to infant care. The nurse made CSW aware that there were several visitors in the patient's room.  MSW intern and CSW will attempt to conduct the assessment during the morning on 01/09/2015.

## 2015-01-08 NOTE — Progress Notes (Signed)
UR chart review completed.  

## 2015-01-09 ENCOUNTER — Ambulatory Visit (HOSPITAL_COMMUNITY): Payer: Medicaid Other

## 2015-01-09 ENCOUNTER — Other Ambulatory Visit (HOSPITAL_COMMUNITY): Payer: Medicaid Other

## 2015-01-09 LAB — RH IG WORKUP (INCLUDES ABO/RH)
ABO/RH(D): B NEG
Fetal Screen: NEGATIVE
GESTATIONAL AGE(WKS): 36
Unit division: 0
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
## 2015-01-09 NOTE — Progress Notes (Signed)
Subjective: Postpartum Day #2: Cesarean Delivery Patient reports incisional pain, tolerating PO, + flatus and no problems voiding.    Objective: Vital signs in last 24 hours: Temp:  [98 F (36.7 C)-98.5 F (36.9 C)] 98.5 F (36.9 C) (10/07 0504) Pulse Rate:  [67-75] 67 (10/07 0504) Resp:  [18] 18 (10/07 0504) BP: (118-134)/(56-77) 134/77 mmHg (10/07 0504)  Physical Exam:  General: alert, cooperative and no distress Lochia: appropriate Uterine Fundus: firm Incision: no significant drainage DVT Evaluation: No evidence of DVT seen on physical exam. No cords or calf tenderness.   Recent Labs  01/06/15 1025 01/08/15 0635  HGB 10.3* 8.4*  HCT 31.9* 26.8*    Assessment/Plan: Status post Cesarean section. Doing well postoperatively.  Anemia Continue current care.  Anticipate d/c home tomorrow with staple removal next week in office.  Morene Crocker, CNM 01/09/2015, 8:22 AM

## 2015-01-09 NOTE — Progress Notes (Signed)
CSW received call from [facility] stating she has received the referral, but also states she spoke with Stephanie Mack this morning regarding services per Dr. Jacelyn Grip request.  [name] states Stephanie Mack declined services this morning.  [name] is willing to follow up with Stephanie Mack on Monday regarding referral received from the hospital.  CSW left message for CPS worker informing him of this conversation with [name] and asked that she contact CSW on Monday regarding Stephanie Mack's response at that time in order to further update CPS worker.

## 2015-01-09 NOTE — Progress Notes (Signed)
CPS worker/Jeff Raul Del called to discuss report.  CSW provided information based on psychosocial assessment completed by S. Venning/LCSW.  He reports that he will be at the hospital to meet with MOB tomorrow between 9-10am.  Hospital staff updated.  CSW assessment faxed to worker.

## 2015-01-10 LAB — TYPE AND SCREEN
ABO/RH(D): B NEG
Antibody Screen: POSITIVE
DAT, IgG: NEGATIVE
Unit division: 0
Unit division: 0

## 2015-01-10 MED ORDER — MEASLES, MUMPS & RUBELLA VAC ~~LOC~~ INJ
0.5000 mL | INJECTION | Freq: Once | SUBCUTANEOUS | Status: AC
  Administered 2015-01-10: 0.5 mL via SUBCUTANEOUS
  Filled 2015-01-10 (×2): qty 0.5

## 2015-01-10 MED ORDER — OXYCODONE-ACETAMINOPHEN 5-325 MG PO TABS: 1.0000 | ORAL_TABLET | ORAL | Status: DC | PRN

## 2015-01-10 MED ORDER — IBUPROFEN 600 MG PO TABS: 600.0000 mg | ORAL_TABLET | Freq: Four times a day (QID) | ORAL | Status: DC | PRN

## 2015-01-10 MED ORDER — MEDROXYPROGESTERONE ACETATE 150 MG/ML IM SUSP: 150.0000 mg | INTRAMUSCULAR | Status: DC

## 2015-01-10 MED ORDER — FUSION PLUS PO CAPS: 1.0000 | ORAL_CAPSULE | Freq: Every day | ORAL | Status: DC

## 2015-01-10 NOTE — Discharge Summary (Signed)
Obstetric Discharge Summary Reason for Admission: cesarean section Prenatal Procedures: NST and ultrasound Intrapartum Procedures: cesarean: low cervical, transverse Postpartum Procedures: none and Rho(D) Ig Complications-Operative and Postpartum: none HEMOGLOBIN  Date Value Ref Range Status  01/08/2015 8.4* 12.0 - 15.0 g/dL Final   HCT  Date Value Ref Range Status  01/08/2015 26.8* 36.0 - 46.0 % Final    Physical Exam:  General: alert and no distress Lochia: appropriate Uterine Fundus: firm Incision: healing well DVT Evaluation: No evidence of DVT seen on physical exam.  Discharge Diagnoses: Term Pregnancy-delivered  Discharge Information: Date: 01/10/2015 Activity: pelvic rest Diet: routine Medications: PNV, Ibuprofen, Colace, Iron and Percocet Condition: stable Instructions: refer to practice specific booklet Discharge to: home Follow-up Information    Follow up with HARPER,CHARLES A, MD. Schedule an appointment as soon as possible for a visit on 01/14/2015.   Specialty:  Obstetrics and Gynecology   Why:  Removal of staples   Contact information:   [address] [place] [address] [phone]       Newborn Data: Live born female  Birth Weight: 6 lb 3.8 oz (2829 g) APGAR: 9, 9  Home with mother.  HARPER,CHARLES A 01/10/2015, 9:10 AM

## 2015-01-10 NOTE — Progress Notes (Signed)
Subjective: Postpartum Day 3: Cesarean Delivery Patient reports tolerating PO, + flatus, + BM and no problems voiding.    Objective: Vital signs in last 24 hours: Temp:  [98.3 F (36.8 C)] 98.3 F (36.8 C) (10/08 0506) Pulse Rate:  [71-79] 71 (10/08 0506) Resp:  [16-18] 16 (10/08 0506) BP: (117-127)/(58-60) 127/58 mmHg (10/08 0506)  Physical Exam:  General: alert and no distress Lochia: appropriate Uterine Fundus: firm Incision: healing well DVT Evaluation: No evidence of DVT seen on physical exam.   Recent Labs  01/08/15 0635  HGB 8.4*  HCT 26.8*    Assessment/Plan: Status post Cesarean section. Doing well postoperatively.  Anemia.  Chronic iron deficiency.  Stable.  Iron Rx. Discharge home with standard precautions and return to clinic in 4-6 weeks.  Dianne Whelchel A 01/10/2015, 8:58 AM
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
## 2015-01-10 NOTE — Plan of Care (Signed)
Problem: Discharge Progression Outcomes Goal: Remove staples per MD order Will be removed in office on Wednesday

## 2015-01-13 ENCOUNTER — Other Ambulatory Visit (HOSPITAL_COMMUNITY): Payer: Medicaid Other

## 2015-01-14 ENCOUNTER — Encounter: Payer: Self-pay | Admitting: Obstetrics

## 2015-01-14 ENCOUNTER — Ambulatory Visit (INDEPENDENT_AMBULATORY_CARE_PROVIDER_SITE_OTHER): Payer: Self-pay | Admitting: Obstetrics

## 2015-01-14 VITALS — BP 134/94 | HR 82 | Wt 136.0 lb

## 2015-01-14 DIAGNOSIS — Z30013 Encounter for initial prescription of injectable contraceptive: Secondary | ICD-10-CM

## 2015-01-14 DIAGNOSIS — O165 Unspecified maternal hypertension, complicating the puerperium: Secondary | ICD-10-CM

## 2015-01-14 NOTE — Progress Notes (Signed)
Subjective:     Stephanie Mack is a 24 y.o. female who presents for a postpartum visit. She is 1 week postpartum following a low cervical transverse Cesarean section. I have fully reviewed the prenatal and intrapartum course. The delivery was at 70 gestational weeks. Outcome: repeat cesarean section, low transverse incision. Anesthesia: spinal. Postpartum course has been normal. Baby's course has been normal. Baby is feeding by bottle - Similac Advance. Bleeding thin lochia. Bowel function is with constipation. Bladder function is normal. Patient is not sexually active. Contraception method is abstinence. Postpartum depression screening: negative.  Tobacco, alcohol and substance abuse history reviewed.  Adult immunizations reviewed including TDAP, rubella and varicella.  The following portions of the patient's history were reviewed and updated as appropriate: allergies, current medications, past family history, past medical history, past social history, past surgical history and problem list.  Review of Systems A comprehensive review of systems was negative.   Objective:    BP 134/94 mmHg  Pulse 82  Wt 136 lb (61.689 kg)  LMP 04/30/2014   PE:       General:  Alert and no distress       Abdomen:  Soft, NT.  Incision C, D, I.  Staples removed and steri strips applied       Extremities:  No C, C, E.   Assessment:     Normal postpartum exam.  Staples removed and steri strips applied.   Contraception counseling and advice.   Constipation  Plan:    1. Contraception: Depo-Provera injections 2. Depo Provera Rx 3. MOM recommended for constipation 4. Follow up in: 2 weeks or as needed.   Healthy lifestyle practices reviewed

## 2015-01-16 ENCOUNTER — Other Ambulatory Visit (HOSPITAL_COMMUNITY): Payer: Medicaid Other

## 2015-01-16 ENCOUNTER — Ambulatory Visit (HOSPITAL_COMMUNITY): Payer: Medicaid Other

## 2015-01-16 ENCOUNTER — Telehealth: Payer: Self-pay | Admitting: *Deleted

## 2015-01-16 NOTE — Telephone Encounter (Signed)
Patient left message asking if prescriptions had been sent to the pharmacy.  Attempted to contact the patient and left message for patient to call the office.

## 2015-01-20 ENCOUNTER — Other Ambulatory Visit (HOSPITAL_COMMUNITY): Payer: Medicaid Other

## 2015-01-23 ENCOUNTER — Ambulatory Visit (HOSPITAL_COMMUNITY): Payer: Medicaid Other

## 2015-01-23 ENCOUNTER — Other Ambulatory Visit (HOSPITAL_COMMUNITY): Payer: Medicaid Other

## 2015-01-27 ENCOUNTER — Other Ambulatory Visit (HOSPITAL_COMMUNITY): Payer: Medicaid Other

## 2015-01-28 ENCOUNTER — Encounter: Payer: Self-pay | Admitting: Obstetrics

## 2015-01-28 ENCOUNTER — Ambulatory Visit (INDEPENDENT_AMBULATORY_CARE_PROVIDER_SITE_OTHER): Payer: Medicaid Other | Admitting: Obstetrics

## 2015-01-28 DIAGNOSIS — Z30013 Encounter for initial prescription of injectable contraceptive: Secondary | ICD-10-CM

## 2015-01-28 MED ORDER — MEDROXYPROGESTERONE ACETATE 150 MG/ML IM SUSP
150.0000 mg | INTRAMUSCULAR | Status: DC
  Administered 2015-01-28 – 2015-04-23 (×2): 150 mg via INTRAMUSCULAR

## 2015-01-28 MED ORDER — MEDROXYPROGESTERONE ACETATE 150 MG/ML IM SUSP: 150.0000 mg | INTRAMUSCULAR | Status: DC

## 2015-01-28 NOTE — Progress Notes (Signed)
Subjective:     Stephanie Mack is a 24 y.o. female who presents for a postpartum visit. She is 3 week postpartum following a repeat LTCS. I have fully reviewed the prenatal and intrapartum course. The delivery was at 4 gestational weeks. Outcome: primary cesarean section, low transverse incision. Anesthesia: spinal. Postpartum course has been normal. Baby's course has been normal. Baby is feeding by bottle - Similac Advance. Bleeding thin lochia. Bowel function is normal. Bladder function is normal. Patient is not sexually active. Contraception method is abstinence. Postpartum depression screening: negative.  Tobacco, alcohol and substance abuse history reviewed.  Adult immunizations reviewed including TDAP, rubella and varicella.  The following portions of the patient's history were reviewed and updated as appropriate: allergies, current medications, past family history, past medical history, past social history, past surgical history and problem list.  Review of Systems A comprehensive review of systems was negative.   Objective:    BP 132/96 mmHg  Pulse 70  Temp(Src) 97.9 F (36.6 C)  Ht 5\' 1"  (1.549 m)  Wt 130 lb (58.968 kg)  BMI 24.58 kg/m2  Breastfeeding? Yes   PE:      Deferred   100% of 10 min visit spent on counseling and coordination of care.  Assessment:    3 weeks postpartum.  Doing well.   Contraceptive counseling and advice.  Plan:    1. Contraception: Depo-Provera injections 2. Depo Provera Rx 3. Follow up in: 4 weeks or as needed.   Healthy lifestyle practices reviewed

## 2015-02-03 ENCOUNTER — Other Ambulatory Visit (HOSPITAL_COMMUNITY): Payer: Medicaid Other

## 2015-02-10 ENCOUNTER — Other Ambulatory Visit (HOSPITAL_COMMUNITY): Payer: Medicaid Other

## 2015-02-20 NOTE — Telephone Encounter (Signed)
Patient in office to see Dr. Jodi Mourning on 01-28-15

## 2015-02-23 ENCOUNTER — Encounter: Payer: Self-pay | Admitting: Obstetrics

## 2015-02-23 ENCOUNTER — Ambulatory Visit (INDEPENDENT_AMBULATORY_CARE_PROVIDER_SITE_OTHER): Payer: Medicaid Other | Admitting: Obstetrics

## 2015-02-23 DIAGNOSIS — Z3042 Encounter for surveillance of injectable contraceptive: Secondary | ICD-10-CM

## 2015-02-23 NOTE — Progress Notes (Signed)
Subjective:     Stephanie Mack is a 24 y.o. female who presents for a postpartum visit. She is 6 weeks postpartum following a low cervical transverse Cesarean section. I have fully reviewed the prenatal and intrapartum course. The delivery was at 86 gestational weeks. Outcome: repeat cesarean section, low transverse incision. Anesthesia: spinal. Postpartum course has been normal. Baby's course has been normal. Baby is feeding by bottle - Similac Advance. Bleeding moderate lochia. Bowel function is normal. Bladder function is normal. Patient is sexually active. Contraception method is Depo-Provera injections. Postpartum depression screening: negative.  Tobacco, alcohol and substance abuse history reviewed.  Adult immunizations reviewed including TDAP, rubella and varicella.  The following portions of the patient's history were reviewed and updated as appropriate: allergies, current medications, past family history, past medical history, past social history, past surgical history and problem list.  Review of Systems A comprehensive review of systems was negative.   Objective:    BP 129/85 mmHg  Pulse 78  Wt 129 lb 12.8 oz (58.877 kg)  General:  alert and no distress   Breasts:  inspection negative, no nipple discharge or bleeding, no masses or nodularity palpable  Lungs: clear to auscultation bilaterally  Heart:  regular rate and rhythm, S1, S2 normal, no murmur, click, rub or gallop  Abdomen: normal findings: soft, non-tender.  Incision C, D, I.   Vulva:  normal  Vagina: normal vagina  Cervix:  no cervical motion tenderness  Corpus: normal size, contour, position, consistency, mobility, non-tender  Adnexa:  no mass, fullness, tenderness  Rectal Exam: Not performed.           Assessment:     Normal postpartum exam. Pap smear not done at today's visit.     Contraceptive management.  Plan:    1. Contraception: Depo-Provera injections 2. Continue PNV's 3. Follow up in: several months  or as needed.  2hr GTT for h/o GDM/screening for DM q 3 yrs per ADA recommendations Preconception counseling provided Healthy lifestyle practices reviewed

## 2015-04-21 ENCOUNTER — Ambulatory Visit: Payer: Medicaid Other

## 2015-04-22 ENCOUNTER — Ambulatory Visit: Payer: Medicaid Other

## 2015-04-23 ENCOUNTER — Ambulatory Visit (INDEPENDENT_AMBULATORY_CARE_PROVIDER_SITE_OTHER): Payer: Medicaid Other | Admitting: *Deleted

## 2015-04-23 ENCOUNTER — Telehealth: Payer: Self-pay | Admitting: *Deleted

## 2015-04-23 ENCOUNTER — Encounter: Payer: Self-pay | Admitting: *Deleted

## 2015-04-23 VITALS — BP 129/85 | HR 84 | Temp 97.4°F | Ht 61.0 in | Wt 127.0 lb

## 2015-04-23 DIAGNOSIS — Z30013 Encounter for initial prescription of injectable contraceptive: Secondary | ICD-10-CM

## 2015-04-23 DIAGNOSIS — Z3042 Encounter for surveillance of injectable contraceptive: Secondary | ICD-10-CM

## 2015-04-23 NOTE — Progress Notes (Signed)
Patient is in the office for her Depo Provera injection. Patient has had some bleeding after her injection - but is not having any bleeding now. Patient does complain of cramping for 3 days and is asking if she can be Rx a prescription for pain medication. Discuss long term use of narcotic pain medication and discouraged use. Discussed her depression and encouraged her to call for appointment anytime she felt she needed. Also discussed antidepressant use- short term and long term. We talked about her relationship with the father of her child and that it is strictly parenting at this time.

## 2015-04-23 NOTE — Telephone Encounter (Signed)
Patient is requesting pain medication for her cramping. I told her I would ask her provider and let her know.

## 2015-04-25 ENCOUNTER — Other Ambulatory Visit: Payer: Self-pay | Admitting: Certified Nurse Midwife

## 2015-04-25 DIAGNOSIS — N946 Dysmenorrhea, unspecified: Secondary | ICD-10-CM

## 2015-04-25 MED ORDER — MELOXICAM 15 MG PO TABS: 15.0000 mg | ORAL_TABLET | Freq: Every day | ORAL | Status: DC

## 2015-04-25 MED ORDER — TRAMADOL HCL 50 MG PO TABS: 50.0000 mg | ORAL_TABLET | Freq: Four times a day (QID) | ORAL | Status: DC | PRN

## 2015-04-25 NOTE — Telephone Encounter (Signed)
I have sent in Mobic to the pharmacy for her to take, please tell her not to take the Motrin with it.  Also, please make sure she is taking her iron.  I have also printed an RX for ultram for severe pain.  Thank you. R.Denney CNM

## 2015-04-27 NOTE — Telephone Encounter (Signed)
Talked with patient about her medications and side effects- She will come to the office to pick up her Rx.

## 2015-05-15 ENCOUNTER — Other Ambulatory Visit: Payer: Self-pay | Admitting: Advanced Practice Midwife

## 2015-05-18 ENCOUNTER — Other Ambulatory Visit: Payer: Self-pay | Admitting: Certified Nurse Midwife

## 2015-05-18 ENCOUNTER — Telehealth: Payer: Self-pay | Admitting: *Deleted

## 2015-05-18 DIAGNOSIS — IMO0001 Reserved for inherently not codable concepts without codable children: Secondary | ICD-10-CM

## 2015-05-18 NOTE — Telephone Encounter (Signed)
Spoke with patient about finding her a primary care to manage her BP and general health concerns. She would prefer to see a female if possible.

## 2015-05-18 NOTE — Telephone Encounter (Signed)
Patient is calling requesting a refill of her BP medication-. 1:50 Attempt to call patient- LM on VM to CB- doesn't look like she has been taking anything.

## 2015-05-25 ENCOUNTER — Encounter (HOSPITAL_COMMUNITY): Payer: Self-pay | Admitting: Family Medicine

## 2015-05-25 ENCOUNTER — Emergency Department (HOSPITAL_COMMUNITY)
Admission: EM | Admit: 2015-05-25 | Discharge: 2015-05-25 | Disposition: A | Discharge status: Home / Self Care | Payer: Medicaid Other | Attending: Emergency Medicine | Admitting: Emergency Medicine

## 2015-05-25 ENCOUNTER — Ambulatory Visit (INDEPENDENT_AMBULATORY_CARE_PROVIDER_SITE_OTHER): Payer: Medicaid Other | Admitting: Obstetrics

## 2015-05-25 ENCOUNTER — Encounter: Payer: Self-pay | Admitting: Obstetrics

## 2015-05-25 VITALS — BP 113/77 | HR 84 | Temp 98.3°F | Wt 123.0 lb

## 2015-05-25 DIAGNOSIS — Y999 Unspecified external cause status: Secondary | ICD-10-CM | POA: Diagnosis not present

## 2015-05-25 DIAGNOSIS — Z3049 Encounter for surveillance of other contraceptives: Secondary | ICD-10-CM

## 2015-05-25 DIAGNOSIS — S00551A Superficial foreign body of lip, initial encounter: Secondary | ICD-10-CM | POA: Insufficient documentation

## 2015-05-25 DIAGNOSIS — Y9289 Other specified places as the place of occurrence of the external cause: Secondary | ICD-10-CM | POA: Insufficient documentation

## 2015-05-25 DIAGNOSIS — X58XXXA Exposure to other specified factors, initial encounter: Secondary | ICD-10-CM | POA: Diagnosis not present

## 2015-05-25 DIAGNOSIS — Y9389 Activity, other specified: Secondary | ICD-10-CM | POA: Insufficient documentation

## 2015-05-25 DIAGNOSIS — Z3042 Encounter for surveillance of injectable contraceptive: Secondary | ICD-10-CM

## 2015-05-25 DIAGNOSIS — N939 Abnormal uterine and vaginal bleeding, unspecified: Secondary | ICD-10-CM | POA: Diagnosis not present

## 2015-05-25 NOTE — ED Notes (Signed)
Patient called with no answer. °

## 2015-05-25 NOTE — ED Notes (Signed)
No answer when called from subwaiting for a room in fast track. This is the third call

## 2015-05-25 NOTE — Progress Notes (Signed)
Patient ID: Stephanie Mack, female   DOB: November 09, 1990, 25 y.o.   MRN: [id number]  Chief Complaint  Patient presents with  . Vaginal Bleeding    Bleeding on Depo. Patient has had two Depo injections.     HPI Stephanie Mack is a 25 y.o. female.  Irregular vaginal bleeding with Depo Provera.  HPI  Past Medical History  Diagnosis Date  . Depression     depression  . Bipolar disorder ([place])   . ADHD (attention deficit hyperactivity disorder)   . Anxiety   . Anemia   . ADHD (attention deficit hyperactivity disorder)   . Bipolar 1 disorder ([place])   . Chlamydia 05-31-10  . Pseudocyesis 2013    Seen in MAU for percieved FM, abd distension. Normal exam.   . Gonorrhea contact, treated   . Headache   . Infection     UTI  . Gestational diabetes     current pregnancy  . GERD (gastroesophageal reflux disease)     with pregnancy  . Pregnancy induced hypertension     previous pregnancy    Past Surgical History  Procedure Laterality Date  . Nasal septum surgery    . Cesarean section N/A 09/01/2012    Procedure:  Primary cesarean section with delivery of baby girl at 66.  Apgars 1/1.  ;  Surgeon: Frederico Hamman, MD;  Location: [facility];  Service: Obstetrics;  Laterality: N/A;  . Cesarean section N/A 01/07/2015    Procedure: REPEAT CESAREAN SECTION;  Surgeon: Shelly Bombard, MD;  Location: [facility];  Service: Obstetrics;  Laterality: N/A;    Family History  Problem Relation Age of Onset  . Kidney disease Mother   . Hypertension Father   . Cancer Sister   . Asthma Brother   . Heart disease Neg Hx   . Diabetes Maternal Grandfather     Social History Social History  Substance Use Topics  . Smoking status: Never Smoker   . Smokeless tobacco: Never Used  . Alcohol Use: No    No Known Allergies  Current Outpatient Prescriptions  Medication Sig Dispense Refill  . aspirin EC 81 MG tablet Take 81 mg by mouth daily.    Marland Kitchen ibuprofen (ADVIL,MOTRIN) 600 MG tablet Take 1 tablet (600 mg  total) by mouth every 6 (six) hours as needed for mild pain. 30 tablet 5  . Iron-FA-B Cmp-C-Biot-Probiotic (FUSION PLUS) CAPS Take 1 capsule by mouth daily before breakfast. 30 capsule 5  . medroxyPROGESTERone (DEPO-PROVERA) 150 MG/ML injection Inject 1 mL (150 mg total) into the muscle every 3 (three) months. 1 mL 3  . Prenat-FeFum-FePo-FA-Omega 3 (CONCEPT DHA) 53.5-38-1 MG CAPS Take 1 capsule by mouth daily. 30 capsule 11  . traMADol (ULTRAM) 50 MG tablet Take 1 tablet (50 mg total) by mouth every 6 (six) hours as needed. 60 tablet 2  . meloxicam (MOBIC) 15 MG tablet Take 1 tablet (15 mg total) by mouth daily. (Patient not taking: Reported on 05/25/2015) 30 tablet 2   Current Facility-Administered Medications  Medication Dose Route Frequency Provider Last Rate Last Dose  . medroxyPROGESTERone (DEPO-PROVERA) injection 150 mg  150 mg Intramuscular Q90 days Shelly Bombard, MD   150 mg at 04/23/15 1146    Review of Systems Review of Systems Constitutional: negative for fatigue and weight loss Respiratory: negative for cough and wheezing Cardiovascular: negative for chest pain, fatigue and palpitations Gastrointestinal: negative for abdominal pain and change in bowel habits Genitourinary:negative Integument/breast: negative for nipple discharge Musculoskeletal:negative for myalgias  Neurological: negative for gait problems and tremors Behavioral/Psych: negative for abusive relationship, depression Endocrine: negative for temperature intolerance     Blood pressure 113/77, pulse 84, temperature 98.3 F (36.8 C), weight 123 lb (55.792 kg), not currently breastfeeding.  Physical Exam Physical Exam           General:  Alert and no distress Abdomen:  normal findings: no organomegaly, soft, non-tender and no hernia  Pelvis:  External genitalia: normal general appearance Urinary system: urethral meatus normal and bladder without fullness, nontender Vaginal: normal without tenderness,  induration or masses Cervix: normal appearance Adnexa: normal bimanual exam Uterus: anteverted and non-tender, normal size      Data Reviewed Labs  Assessment     AUB Depo Provera management     Plan    Wet prep and cultures sent Lo Loestrin 24 taper q 8 hours x 8 days for endometrial stabilization. F/U 2 months for Depo and Annual  No orders of the defined types were placed in this encounter.   No orders of the defined types were placed in this encounter.

## 2015-05-25 NOTE — ED Notes (Signed)
Called x 2 with no answer. 

## 2015-05-25 NOTE — ED Notes (Signed)
Pt here sts the skin has grown over her lip ring and she needs it removed for a job.

## 2015-05-26 ENCOUNTER — Encounter: Payer: Self-pay | Admitting: Obstetrics

## 2015-06-03 LAB — SURESWAB, VAGINOSIS/VAGINITIS PLUS
ATOPOBIUM VAGINAE: 6.3 Log (cells/mL)
BV CATEGORY: UNDETERMINED — AB
C. albicans, DNA: NOT DETECTED
C. glabrata, DNA: NOT DETECTED
C. parapsilosis, DNA: NOT DETECTED
C. trachomatis RNA, TMA: DETECTED — AB
C. tropicalis, DNA: NOT DETECTED
LACTOBACILLUS SPECIES: 8 Log (cells/mL)
MEGASPHAERA SPECIES: NOT DETECTED Log (cells/mL)
N. gonorrhoeae RNA, TMA: NOT DETECTED
T. VAGINALIS RNA, QL TMA: NOT DETECTED

## 2015-06-04 ENCOUNTER — Other Ambulatory Visit: Payer: Self-pay | Admitting: Obstetrics

## 2015-06-04 DIAGNOSIS — N760 Acute vaginitis: Principal | ICD-10-CM

## 2015-06-04 DIAGNOSIS — A749 Chlamydial infection, unspecified: Secondary | ICD-10-CM

## 2015-06-04 DIAGNOSIS — B9689 Other specified bacterial agents as the cause of diseases classified elsewhere: Secondary | ICD-10-CM

## 2015-06-04 MED ORDER — TINIDAZOLE 500 MG PO TABS: 1000.0000 mg | ORAL_TABLET | Freq: Every day | ORAL | Status: DC

## 2015-06-04 MED ORDER — AZITHROMYCIN 250 MG PO TABS: 1000.0000 mg | ORAL_TABLET | Freq: Once | ORAL | Status: DC

## 2015-06-04 MED ORDER — CEFIXIME 400 MG PO TABS: 400.0000 mg | ORAL_TABLET | Freq: Every day | ORAL | Status: DC

## 2015-06-18 ENCOUNTER — Emergency Department (HOSPITAL_COMMUNITY)
Admission: EM | Admit: 2015-06-18 | Discharge: 2015-06-19 | Disposition: A | Discharge status: Other Acute Inpatient Hospital | Payer: Medicaid Other | Attending: Emergency Medicine | Admitting: Emergency Medicine

## 2015-06-18 ENCOUNTER — Encounter (HOSPITAL_COMMUNITY): Payer: Self-pay | Admitting: Emergency Medicine

## 2015-06-18 DIAGNOSIS — Z8619 Personal history of other infectious and parasitic diseases: Secondary | ICD-10-CM | POA: Insufficient documentation

## 2015-06-18 DIAGNOSIS — F32A Depression, unspecified: Secondary | ICD-10-CM

## 2015-06-18 DIAGNOSIS — F3130 Bipolar disorder, current episode depressed, mild or moderate severity, unspecified: Secondary | ICD-10-CM | POA: Insufficient documentation

## 2015-06-18 DIAGNOSIS — Z8632 Personal history of gestational diabetes: Secondary | ICD-10-CM | POA: Insufficient documentation

## 2015-06-18 DIAGNOSIS — Z87440 Personal history of urinary (tract) infections: Secondary | ICD-10-CM | POA: Insufficient documentation

## 2015-06-18 DIAGNOSIS — D649 Anemia, unspecified: Secondary | ICD-10-CM | POA: Insufficient documentation

## 2015-06-18 DIAGNOSIS — Z792 Long term (current) use of antibiotics: Secondary | ICD-10-CM | POA: Insufficient documentation

## 2015-06-18 DIAGNOSIS — Z7982 Long term (current) use of aspirin: Secondary | ICD-10-CM | POA: Insufficient documentation

## 2015-06-18 DIAGNOSIS — R110 Nausea: Secondary | ICD-10-CM | POA: Insufficient documentation

## 2015-06-18 DIAGNOSIS — R440 Auditory hallucinations: Secondary | ICD-10-CM | POA: Insufficient documentation

## 2015-06-18 DIAGNOSIS — Z79899 Other long term (current) drug therapy: Secondary | ICD-10-CM | POA: Insufficient documentation

## 2015-06-18 DIAGNOSIS — R443 Hallucinations, unspecified: Secondary | ICD-10-CM

## 2015-06-18 DIAGNOSIS — F329 Major depressive disorder, single episode, unspecified: Secondary | ICD-10-CM

## 2015-06-18 DIAGNOSIS — R1030 Lower abdominal pain, unspecified: Secondary | ICD-10-CM | POA: Insufficient documentation

## 2015-06-18 LAB — RAPID URINE DRUG SCREEN, HOSP PERFORMED
AMPHETAMINES: NOT DETECTED
BENZODIAZEPINES: NOT DETECTED
Barbiturates: NOT DETECTED
Cocaine: NOT DETECTED
OPIATES: NOT DETECTED
Tetrahydrocannabinol: NOT DETECTED

## 2015-06-18 LAB — CBC
HCT: 41.8 % (ref 36.0–46.0)
Hemoglobin: 13.2 g/dL (ref 12.0–15.0)
MCH: 25.3 pg — ABNORMAL LOW (ref 26.0–34.0)
MCHC: 31.6 g/dL (ref 30.0–36.0)
MCV: 80.1 fL (ref 78.0–100.0)
PLATELETS: 252 10*3/uL (ref 150–400)
RBC: 5.22 MIL/uL — AB (ref 3.87–5.11)
RDW: 13.9 % (ref 11.5–15.5)
WBC: 5.6 10*3/uL (ref 4.0–10.5)

## 2015-06-18 LAB — COMPREHENSIVE METABOLIC PANEL
ALBUMIN: 4.4 g/dL (ref 3.5–5.0)
ALT: 22 U/L (ref 14–54)
AST: 23 U/L (ref 15–41)
Alkaline Phosphatase: 59 U/L (ref 38–126)
Anion gap: 10 (ref 5–15)
BILIRUBIN TOTAL: 0.3 mg/dL (ref 0.3–1.2)
BUN: 8 mg/dL (ref 6–20)
CO2: 23 mmol/L (ref 22–32)
CREATININE: 0.69 mg/dL (ref 0.44–1.00)
Calcium: 9.1 mg/dL (ref 8.9–10.3)
Chloride: 108 mmol/L (ref 101–111)
GFR calc Af Amer: >60 mL/min (ref >60)
GLUCOSE: 94 mg/dL (ref 65–99)
POTASSIUM: 3.4 mmol/L — AB (ref 3.5–5.1)
Sodium: 141 mmol/L (ref 135–145)
TOTAL PROTEIN: 7.9 g/dL (ref 6.5–8.1)

## 2015-06-18 LAB — URINALYSIS, ROUTINE W REFLEX MICROSCOPIC
Bilirubin Urine: NEGATIVE
GLUCOSE, UA: NEGATIVE mg/dL
HGB URINE DIPSTICK: NEGATIVE
Ketones, ur: NEGATIVE mg/dL
Nitrite: NEGATIVE
PROTEIN: 100 mg/dL — AB
SPECIFIC GRAVITY, URINE: 1.027 (ref 1.005–1.030)
pH: 7 (ref 5.0–8.0)

## 2015-06-18 LAB — URINE MICROSCOPIC-ADD ON

## 2015-06-18 LAB — ETHANOL

## 2015-06-18 LAB — ACETAMINOPHEN LEVEL: Acetaminophen (Tylenol), Serum: <10 ug/mL — ABNORMAL LOW (ref 10–30)

## 2015-06-18 LAB — SALICYLATE LEVEL: Salicylate Lvl: <4 mg/dL (ref 2.8–30.0)

## 2015-06-18 LAB — LIPASE, BLOOD: Lipase: 61 U/L — ABNORMAL HIGH (ref 11–51)

## 2015-06-18 MED ORDER — ONDANSETRON HCL 4 MG PO TABS: 4.0000 mg | ORAL_TABLET | Freq: Three times a day (TID) | ORAL | Status: DC | PRN

## 2015-06-18 MED ORDER — LORAZEPAM 1 MG PO TABS: 1.0000 mg | ORAL_TABLET | Freq: Three times a day (TID) | ORAL | Status: DC | PRN

## 2015-06-18 NOTE — ED Notes (Signed)
Informed provider of UA.

## 2015-06-18 NOTE — ED Notes (Signed)
Patient denies SI/HI/AVH. States she just been depressed since losing her baby in Oct '16.

## 2015-06-18 NOTE — ED Notes (Addendum)
Patient states she has an appointment with [facility] Monday in am for medication adjustment.  States she has been off her meds x1 year.  Reports a "mental breakdown". Denies SI/HI or AVH.  Calm and cooperative.

## 2015-06-18 NOTE — ED Notes (Signed)
Patient presents under IVC. Paperwork states "Danger to self and others. Respondent has been previously committed at behavioral health in [place] in 2012. Respondent is diagnosed with schizophrenia and is not taking her medication as prescribed. She states that the "voices" tell her to kill herself, her mother and her grandmother. According to petitioner she is currently trying to kill herself with a knife. She has also been talking about taking her whole bottle of pills. She recently had a child and has been off her meds since having the baby according to the petitioner."

## 2015-06-18 NOTE — ED Provider Notes (Signed)
CSN: [id number]     Arrival date & time 06/18/15  1557 History   First MD Initiated Contact with Patient 06/18/15 1610     Chief Complaint  Patient presents with  . IVC    (Consider location/radiation/quality/duration/timing/severity/associated sxs/prior Treatment) HPI 25 y.o. female with a hx of Schizophrenia, presents to the Emergency Department today due to IVC. Paperwork states that the patient was previously committed at [facility] in 2012. She was diagnosed with Schizophrenia at this time. It states that she is hearing voices that tell her to kill herself, her mother and her grandmother. According to the peitioner she is trying to kill herself with a knife. Also report states that she tried taking a whole bottle of pills. Pt recently lost a child in Oct. 2016.  Upon questioning the patient, she states that she has been extremely depressed recently since the loss of her child. States that her birthday is coming up and is making her even more depressed. Pt denies SI or HI. No plan. States that she feels some nausea and lower abdominal pain. No other symptoms noted.   Past Medical History  Diagnosis Date  . Depression     depression  . Bipolar disorder ([place])   . ADHD (attention deficit hyperactivity disorder)   . Anxiety   . Anemia   . ADHD (attention deficit hyperactivity disorder)   . Bipolar 1 disorder ([place])   . Chlamydia 05-31-10  . Pseudocyesis 2013    Seen in MAU for percieved FM, abd distension. Normal exam.   . Gonorrhea contact, treated   . Headache   . Infection     UTI  . Gestational diabetes     current pregnancy  . GERD (gastroesophageal reflux disease)     with pregnancy  . Pregnancy induced hypertension     previous pregnancy   Past Surgical History  Procedure Laterality Date  . Nasal septum surgery    . Cesarean section N/A 09/01/2012    Procedure:  Primary cesarean section with delivery of baby girl at 70.  Apgars 1/1.  ;  Surgeon: Frederico Hamman, MD;   Location: [facility];  Service: Obstetrics;  Laterality: N/A;  . Cesarean section N/A 01/07/2015    Procedure: REPEAT CESAREAN SECTION;  Surgeon: Shelly Bombard, MD;  Location: [facility];  Service: Obstetrics;  Laterality: N/A;   Family History  Problem Relation Age of Onset  . Kidney disease Mother   . Hypertension Father   . Cancer Sister   . Asthma Brother   . Heart disease Neg Hx   . Diabetes Maternal Grandfather    Social History  Substance Use Topics  . Smoking status: Never Smoker   . Smokeless tobacco: Never Used  . Alcohol Use: No   OB History    Gravida Para Term Preterm AB TAB SAB Ectopic Multiple Living   3 2 0 2 1 0 1 0 0 1      Review of Systems ROS reviewed and all are negative for acute change except as noted in the HPI.  Allergies  Review of patient's allergies indicates no known allergies.  Home Medications   Prior to Admission medications   Medication Sig Start Date End Date Taking? Authorizing Provider  aspirin EC 81 MG tablet Take 81 mg by mouth daily.   Yes Historical Provider, MD  azithromycin (ZITHROMAX) 250 MG tablet Take 4 tablets (1,000 mg total) by mouth once. 06/04/15  Yes Shelly Bombard, MD  cefixime (SUPRAX) 400 MG tablet  Take 1 tablet (400 mg total) by mouth daily. 06/04/15  Yes Shelly Bombard, MD  ibuprofen (ADVIL,MOTRIN) 600 MG tablet Take 1 tablet (600 mg total) by mouth every 6 (six) hours as needed for mild pain. 01/10/15  Yes Shelly Bombard, MD  Iron-FA-B Cmp-C-Biot-Probiotic (FUSION PLUS) CAPS Take 1 capsule by mouth daily before breakfast. 01/10/15  Yes Shelly Bombard, MD  medroxyPROGESTERone (DEPO-PROVERA) 150 MG/ML injection Inject 1 mL (150 mg total) into the muscle every 3 (three) months. 01/28/15  Yes Shelly Bombard, MD  Prenat-FeFum-FePo-FA-Omega 3 (CONCEPT DHA) 53.5-38-1 MG CAPS Take 1 capsule by mouth daily. 11/05/14  Yes Rachelle A Denney, CNM  meloxicam (MOBIC) 15 MG tablet Take 1 tablet (15 mg total) by mouth daily. Patient not  taking: Reported on 05/25/2015 04/25/15   Ernst Bowler A Denney, CNM  tinidazole (TINDAMAX) 500 MG tablet Take 2 tablets (1,000 mg total) by mouth daily with breakfast. Patient not taking: Reported on 06/18/2015 06/04/15   Shelly Bombard, MD  traMADol (ULTRAM) 50 MG tablet Take 1 tablet (50 mg total) by mouth every 6 (six) hours as needed. Patient not taking: Reported on 06/18/2015 04/25/15   Rachelle A Denney, CNM   BP 139/97 mmHg  Pulse 102  Temp(Src) 98.7 F (37.1 C) (Oral)  Resp 18  SpO2 100%   Physical Exam  Constitutional: She is oriented to person, place, and time. She appears well-developed and well-nourished.  HENT:  Head: Normocephalic and atraumatic.  Eyes: EOM are normal. Pupils are equal, round, and reactive to light.  Neck: Normal range of motion. Neck supple. No tracheal deviation present.  Cardiovascular: Normal rate, regular rhythm and normal heart sounds.   No murmur heard. Pulmonary/Chest: Effort normal and breath sounds normal. No respiratory distress. She has no wheezes. She has no rales. She exhibits no tenderness.  Abdominal: Soft. There is no tenderness.  Musculoskeletal: Normal range of motion.  Neurological: She is alert and oriented to person, place, and time.  Skin: Skin is warm and dry.  Psychiatric: She has a normal mood and affect. Her behavior is normal. Thought content normal.  Nursing note and vitals reviewed.   ED Course  Procedures (including critical care time) Labs Review Labs Reviewed  COMPREHENSIVE METABOLIC PANEL - Abnormal; Notable for the following:    Potassium 3.4 (*)    All other components within normal limits  ACETAMINOPHEN LEVEL - Abnormal; Notable for the following:    Acetaminophen (Tylenol), Serum <10 (*)    All other components within normal limits  CBC - Abnormal; Notable for the following:    RBC 5.22 (*)    MCH 25.3 (*)    All other components within normal limits  LIPASE, BLOOD - Abnormal; Notable for the following:     Lipase 61 (*)    All other components within normal limits  ETHANOL  SALICYLATE LEVEL  URINE RAPID DRUG SCREEN, HOSP PERFORMED  URINALYSIS, ROUTINE W REFLEX MICROSCOPIC (NOT AT [facility])   Imaging Review No results found. I have personally reviewed and evaluated these images and lab results as part of my medical decision-making.   EKG Interpretation None      MDM  I have reviewed and evaluated the relevant laboratory values. I have reviewed the relevant previous healthcare records. I obtained HPI from historian.  ED Course:  Assessment: Pt is a 24yF with hx Schizophrenia who presents due to IVC with SI and HI w/ plan. According to report, hearing voices telling her to kill herself,  mother, grandmother. Plan with knife or medication overdose. Denies currently in ED. Notes not taking schizophrenia medication x 2 months. Wishes for medications again. On exam, pt in NAD. Nontoxic/nonseptic appearing. VSS. Afebrile. Lungs CTA. Heart RRR. Abdomen nontender soft. Labs unremarkable. Plan is to TTS consult with placement in Psych hold. At time of discharge, Patient is in no acute distress. Vital Signs are stable. Patient is able to ambulate. Patient able to tolerate PO.    Disposition/Plan:  TTS Consult Pt acknowledges and agrees with plan  Supervising Physician Julianne Rice, MD   Final diagnoses:  None       Shary Decamp, PA-C 06/18/15 1743  Julianne Rice, MD 06/18/15 [phone]
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
## 2015-06-18 NOTE — ED Notes (Signed)
CPS in to interview patient. Consent given.

## 2015-06-19 ENCOUNTER — Inpatient Hospital Stay (HOSPITAL_COMMUNITY)
Admission: AD | Admit: 2015-06-19 | Discharge: 2015-06-23 | DRG: 885 | Disposition: A | Discharge status: Home / Self Care | Payer: Medicaid Other | Source: Intra-hospital | Attending: Psychiatry | Admitting: Psychiatry

## 2015-06-19 ENCOUNTER — Encounter (HOSPITAL_COMMUNITY): Payer: Self-pay | Admitting: Nurse Practitioner

## 2015-06-19 DIAGNOSIS — Z8249 Family history of ischemic heart disease and other diseases of the circulatory system: Secondary | ICD-10-CM | POA: Diagnosis not present

## 2015-06-19 DIAGNOSIS — R45851 Suicidal ideations: Secondary | ICD-10-CM | POA: Diagnosis present

## 2015-06-19 DIAGNOSIS — I10 Essential (primary) hypertension: Secondary | ICD-10-CM | POA: Diagnosis present

## 2015-06-19 DIAGNOSIS — F314 Bipolar disorder, current episode depressed, severe, without psychotic features: Secondary | ICD-10-CM | POA: Insufficient documentation

## 2015-06-19 DIAGNOSIS — F3130 Bipolar disorder, current episode depressed, mild or moderate severity, unspecified: Secondary | ICD-10-CM

## 2015-06-19 DIAGNOSIS — Z833 Family history of diabetes mellitus: Secondary | ICD-10-CM | POA: Diagnosis not present

## 2015-06-19 DIAGNOSIS — Z818 Family history of other mental and behavioral disorders: Secondary | ICD-10-CM

## 2015-06-19 DIAGNOSIS — Z841 Family history of disorders of kidney and ureter: Secondary | ICD-10-CM | POA: Diagnosis not present

## 2015-06-19 DIAGNOSIS — R45850 Homicidal ideations: Secondary | ICD-10-CM | POA: Diagnosis present

## 2015-06-19 DIAGNOSIS — F419 Anxiety disorder, unspecified: Secondary | ICD-10-CM | POA: Diagnosis present

## 2015-06-19 DIAGNOSIS — Z79899 Other long term (current) drug therapy: Secondary | ICD-10-CM

## 2015-06-19 DIAGNOSIS — F909 Attention-deficit hyperactivity disorder, unspecified type: Secondary | ICD-10-CM | POA: Diagnosis present

## 2015-06-19 DIAGNOSIS — F319 Bipolar disorder, unspecified: Principal | ICD-10-CM | POA: Diagnosis present

## 2015-06-19 DIAGNOSIS — F209 Schizophrenia, unspecified: Secondary | ICD-10-CM | POA: Diagnosis present

## 2015-06-19 DIAGNOSIS — Z809 Family history of malignant neoplasm, unspecified: Secondary | ICD-10-CM

## 2015-06-19 DIAGNOSIS — K219 Gastro-esophageal reflux disease without esophagitis: Secondary | ICD-10-CM | POA: Diagnosis present

## 2015-06-19 DIAGNOSIS — Z825 Family history of asthma and other chronic lower respiratory diseases: Secondary | ICD-10-CM

## 2015-06-19 MED ORDER — HYDROXYZINE HCL 25 MG PO TABS
25.0000 mg | ORAL_TABLET | Freq: Four times a day (QID) | ORAL | Status: DC | PRN
Start: 2015-06-19 — End: 2015-06-23

## 2015-06-19 MED ORDER — MAGNESIUM HYDROXIDE 400 MG/5ML PO SUSP: 30.0000 mL | Freq: Every day | ORAL | Status: DC | PRN

## 2015-06-19 MED ORDER — ALUM & MAG HYDROXIDE-SIMETH 200-200-20 MG/5ML PO SUSP
30.0000 mL | ORAL | Status: DC | PRN
  Administered 2015-06-19 (×2): 30 mL via ORAL
  Filled 2015-06-19 (×2): qty 30

## 2015-06-19 MED ORDER — OLANZAPINE 5 MG PO TABS
5.0000 mg | ORAL_TABLET | Freq: Every day | ORAL | Status: DC
  Administered 2015-06-19 – 2015-06-22 (×4): 5 mg via ORAL
  Filled 2015-06-19 (×6): qty 1

## 2015-06-19 MED ORDER — SERTRALINE HCL 50 MG PO TABS
50.0000 mg | ORAL_TABLET | Freq: Every day | ORAL | Status: DC
  Administered 2015-06-19 – 2015-06-23 (×5): 50 mg via ORAL
  Filled 2015-06-19 (×8): qty 1

## 2015-06-19 MED ORDER — ACETAMINOPHEN 325 MG PO TABS
650.0000 mg | ORAL_TABLET | Freq: Four times a day (QID) | ORAL | Status: DC | PRN
  Administered 2015-06-19 (×2): 650 mg via ORAL
  Filled 2015-06-19 (×2): qty 2

## 2015-06-19 NOTE — Tx Team (Signed)
Interdisciplinary Treatment Plan Update (Adult) Date: 06/19/2015   Date: 06/19/2015 5:09 PM  Progress in Treatment:  Attending groups: Yes  Participating in groups: Yes  Taking medication as prescribed: Yes  Tolerating medication: Yes  Family/Significant othe contact made: No, Pt declines Patient understands diagnosis: Yes AEB seeking help with depression Discussing patient identified problems/goals with staff: Yes  Medical problems stabilized or resolved: Yes  Denies suicidal/homicidal ideation: Yes Patient has not harmed self or Others: Yes   New problem(s) identified: None identified at this time.   Discharge Plan or Barriers: Pt will return home and follow-up with [facility].  Additional comments:  Patient and CSW reviewed pt's identified goals and treatment plan. Patient verbalized understanding and agreed to treatment plan. CSW reviewed [facility] "Discharge Process and Patient Involvement" Form. Pt verbalized understanding of information provided and signed form.   Reason for Continuation of Hospitalization:  Anxiety Depression Medication stabilization Suicidal ideation  Estimated length of stay: 3-5 days  Review of initial/current patient goals per problem list:   1.  Goal(s): Patient will participate in aftercare plan  Met:  Yes  Target date: 3-5 days from date of admission   As evidenced by: Patient will participate within aftercare plan AEB aftercare provider and housing plan at discharge being identified.   06/19/15: Pt will return home and follow-up with [facility].  2.  Goal (s): Patient will exhibit decreased depressive symptoms and suicidal ideations.  Met:  Progressing  Target date: 3-5 days from date of admission   As evidenced by: Patient will utilize self rating of depression at 3 or below and demonstrate decreased signs of depression or be deemed stable for discharge by MD.  06/19/15: Pt reports that her depression is improving.  3.  Goal(s): Patient will  demonstrate decreased signs and symptoms of anxiety.  Met:  Progressing  Target date: 3-5 days from date of admission   As evidenced by: Patient will utilize self rating of anxiety at 3 or below and demonstrated decreased signs of anxiety, or be deemed stable for discharge by MD  06/19/15: Pt reports improving anxiety. Attendees:  Patient:    Family:    Physician: Dr. Parke Poisson, MD  06/19/2015 5:09 PM  Nursing: Lars Pinks, RN Case manager  06/19/2015 5:09 PM  Clinical Social Worker Peri Maris, [place] 06/19/2015 5:09 PM  Other: Tilden Fossa, [place] 06/19/2015 5:09 PM  Clinical:  Grayland Ormond, RN; Marcella Dubs, RN 06/19/2015 5:09 PM  Other: , RN Charge Nurse 06/19/2015 5:09 PM  Other: Hilda Lias, [place], [place] Social Work [phone]

## 2015-06-19 NOTE — [facility] Group Notes (Signed)
[facility] LCSW Aftercare Discharge Planning Group Note  06/19/2015 8:45 AM  Participation Quality: Alert, Appropriate and Oriented  Mood/Affect: Flat  Depression Rating: 0  Anxiety Rating: 0  Thoughts of Suicide: Pt denies SI/HI  Will you contract for safety? Yes  Current AVH: Pt denies  Plan for Discharge/Comments: Pt attended discharge planning group and actively participated in group. CSW discussed suicide prevention education with the group and encouraged them to discuss discharge planning and any relevant barriers. Pt reports no needs today and denies depression or anxiety. She identified [facility] as her outpatient provider  Transportation Means: Pt reports access to transportation  Supports: No supports mentioned at this time  Peri Maris, [place] 06/19/2015 11:29 AM

## 2015-06-19 NOTE — ED Notes (Signed)
Patient is calm and cooperative at this time. Patient admits to [facility] with out plan  and increase depression at this time. Patient admits to hearing voices telling her to kill herself, mother and grandmother. Patient states she has been off her medication needs to get back on. Plan of care discussed with patient. Patient and nurse had therapeutic discussion. Patient voices no complaints or concerns at this time. Encouragement and support provided and safety maintain. Q 15 min safety checks remain in place.

## 2015-06-19 NOTE — Plan of Care (Signed)
Problem: Consults Goal: Depression Patient Education See Patient Education Module for education specifics.  Outcome: Progressing Nurse discussed depression/coping skills with patient.        

## 2015-06-19 NOTE — [facility] Group Notes (Signed)
Adult Psychoeducational Group Note  Date:  06/19/2015 Time:  9:27 PM  Group Topic/Focus:  Wrap-Up Group:   The focus of this group is to help patients review their daily goal of treatment and discuss progress on daily workbooks.  Participation Level:  Minimal  Participation Quality:  Appropriate  Affect:  Appropriate  Cognitive:  Appropriate  Insight: Good  Engagement in Group:  Limited  Modes of Intervention:  Discussion  Additional Comments:  Pt stated her day was good.  She stated she finally got her medication.  Pt had no goal and no discharge plans as of yet.  Victorino Sparrow A 06/19/2015, 9:27 PM
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
## 2015-06-19 NOTE — Tx Team (Signed)
Initial Interdisciplinary Treatment Plan   PATIENT STRESSORS: Loss of child. Preeclampia with premature birth at 77mth. Preemie died 3 days later. Anniversary of death approaching.  Marital or family conflict Medication change or noncompliance   PATIENT STRENGTHS: Ability for insight Average or above average intelligence Communication skills General fund of knowledge Motivation for treatment/growth Physical Health Supportive family/friends   PROBLEM LIST: Problem List/Patient Goals Date to be addressed Date deferred Reason deferred Estimated date of resolution  "Help me get back on my medicines" 06-19-2015     "Get back home to my daughter" 06-19-2015     Depression 06-19-2015     Schizophrenia 06-19-2015     Bi polar disorder 06-19-2015                              DISCHARGE CRITERIA:  Ability to meet basic life and health needs Improved stabilization in mood, thinking, and/or behavior Motivation to continue treatment in a less acute level of care Safe-care adequate arrangements made Verbal commitment to aftercare and medication compliance  PRELIMINARY DISCHARGE PLAN: Attend PHP/IOP Outpatient therapy Return to previous work or school arrangements  PATIENT/FAMIILY INVOLVEMENT: This treatment plan has been presented to and reviewed with the patient, Stephanie Mack.  The patient and family have been given the opportunity to ask questions and make suggestions.  Gildardo Pounds 06/19/2015, 3:29 AM

## 2015-06-19 NOTE — ED Notes (Signed)
Pt transported to [facility] by [employer] for continuation of specialized care. Pt left in no acute distress. Belongings signed for and given to [employer] officer. Pt left in no acute distress. 

## 2015-06-19 NOTE — [facility] Group Notes (Signed)
[place] LCSW Group Therapy 06/19/2015 1:15pm  Type of Therapy: Group Therapy- Feelings Around Relapse and Recovery  Participation Level: Reserved  Participation Quality:  Appropriate  Affect:  Appropriate  Cognitive: Alert and Oriented   Insight:  Developing   Engagement in Therapy: Developing/Improving and Engaged   Modes of Intervention: Clarification, Confrontation, Discussion, Education, Exploration, Limit-setting, Orientation, Problem-solving, Rapport Building, Art therapist, Socialization and Support  Summary of Progress/Problems: The topic for today was feelings about relapse. The group discussed what relapse prevention is to them and identified triggers that they are on the path to relapse. Members also processed their feeling towards relapse and were able to relate to common experiences. Group also discussed coping skills that can be used for relapse prevention.  Pt was reserved during group discussion, however participated in group activity related to mindfulness skills. Pt was able to identify emotions and sensations that she experienced during the activities.    Therapeutic Modalities:   Cognitive Behavioral Therapy Solution-Focused Therapy Assertiveness Training Relapse Prevention Therapy    Norman Clay [phone] 06/19/2015 4:22 PM
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
## 2015-06-19 NOTE — [facility] Suicide Risk Assessment (Signed)
[place] INPATIENT:  Family/Significant Other Suicide Prevention Education  Suicide Prevention Education:  Patient Refusal for Family/Significant Other Suicide Prevention Education: The patient Stephanie Mack has refused to provide written consent for family/significant other to be provided Family/Significant Other Suicide Prevention Education during admission and/or prior to discharge.  Physician notified.  Bo Mcclintock 06/19/2015, 5:09 PM
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
## 2015-06-19 NOTE — Progress Notes (Signed)
D:  Patient's self inventory sheet, patient sleeps good.  Good appetite, normal energy level, good concentration.  Rated depression 1, denied hopeless and anxiety.  Denied withdrawals.  Denied SI.  Denied physical problems.  Denied pain.  Goal is to get back on medicine and continue to do her best.  Plans to keep taking meds and do a good job.  Does have discharge plans. A:  Medications administered per MD orders.  Emotional support and encouragement given patient. R:  Denied SI and HI, contracts for safety.  Denied A/V hallucinations.  Safety maintained with 15 minute checks.
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
## 2015-06-20 DIAGNOSIS — F319 Bipolar disorder, unspecified: Principal | ICD-10-CM

## 2015-06-20 LAB — LIPID PANEL
Cholesterol: 192 mg/dL (ref 0–200)
HDL: 49 mg/dL (ref >40)
LDL CALC: 121 mg/dL — AB (ref 0–99)
Total CHOL/HDL Ratio: 3.9 RATIO
Triglycerides: 109 mg/dL (ref <150)
VLDL: 22 mg/dL (ref 0–40)

## 2015-06-20 NOTE — Progress Notes (Signed)
[place] Group Notes:  (Nursing/MHT/Case Management/Adjunct)  Date:  06/20/2015  Time:  1:40 PM  Type of Therapy:  Nurse Education  Participation Level:  Active  Participation Quality:  Appropriate  Affect:  Appropriate  Cognitive:  Alert and Oriented  Insight:  Appropriate  Engagement in Group:  Engaged  Modes of Intervention:  Discussion, Socialization and Support  Summary of Progress/Problems:The purpose of this group was to discuss healthy coping skills. Pt talked about staying out of her room and not sleeping too much. Pt was attentive to others during group.  Mosie Lukes 06/20/2015, 1:40 PM

## 2015-06-20 NOTE — Progress Notes (Signed)
Pt c/o feeling dizzy this morning and was unsteady when out of bed. Checked vitals and they were WNL. Gave water and Gatorade. Pt reports that she is feeling better. Pt's affect is brighter this afternoon. Pt rates depression and anxiety as a 0 on 0-10 scale with 10 being the most. A:Offered support, encouragement and 15 minute checks. R:Pt denies si and hi. Safety maintained on the unit.

## 2015-06-20 NOTE — [facility] Group Notes (Signed)
[place] LCSW Group Therapy  06/20/2015 /10 AM  Type of Therapy:  Group Therapy  Participation Level:  Did Not Attend although encouraged to do so  Summary of Progress/Problems: The main focus of today's process group was for the patient to identify ways in which they have in the past sabotaged their own recovery. Motivational Interviewing was utilized to ask the group members what they get out of their self sabotaging behavior(s), and what reasons they may have for wanting to change. The Stages of Change were explained using a handout, and patients identified where they currently are with regard to stages of change.    Stephanie Pigeon, LCSW
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
## 2015-06-20 NOTE — Progress Notes (Signed)
Brionnah rates Anxiety 0/10 and Depression 2/10. She states today was good. Her mom came to visit her and she attended group this evening. Denies SI/HI/AVH. Encouragement and support given. Medications administered as prescribed. Continue Q 15 minute checks for patient safety and medication effectiveness.

## 2015-06-21 NOTE — BH Assessment (Addendum)
  [place] LCSW Group Therapy  06/21/2015  10:15 AM  Type of Therapy:  Group Therapy  Participation Level:  Minimal as patient was asleep for most  Participation Quality:  Drowsy and did answer direct questions at end  Affect:  Lethargic  Cognitive:  Oriented  Insight:  Limited  Engagement in Therapy:  Limited  Modes of Intervention:  Activity, Discussion, Education, Rapport Building, Socialization and Support  Summary of Progress/Problems: Patients processed thoughts and feelings about up coming discharge. We then discussed what is a supportive framework? What does it look like feel like and how do I discern it from and unhealthy non-supportive network?  We then looked at visuals representing relationships as patients identified difficulties with boundaries, expectations, and poor choices within relationships. Patient answered a few direct questions once she awoke. Appeared very drowsy and named family supports; unable to state what she would want in a new support.    Sheilah Pigeon, LCSW

## 2015-06-21 NOTE — Progress Notes (Signed)
Adult Psychoeducational Group Note  Date:  06/21/2015 Time:  9:27 PM  Group Topic/Focus:  Wrap-Up Group:   The focus of this group is to help patients review their daily goal of treatment and discuss progress on daily workbooks.  Participation Level:  Active  Participation Quality:  Attentive  Affect:  Anxious and Appropriate  Cognitive:  Appropriate  Insight: Appropriate  Engagement in Group:  Engaged  Modes of Intervention:  Discussion  Additional Comments:  Pt rated her day a 10 out of 10. Pt goal for tomorrow is to keep utilizing coping skills.   Jerline Pain 06/21/2015, 9:27 PM
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
## 2015-06-21 NOTE — Progress Notes (Signed)
Writer spoke with patient 1:1 and she reports having had a good day and feels that she is on the right medications now and feels that they are working. She plans to stay on them after discharge. Writer encouraged her to stay on her medications so that she will be able to take care of her baby. She reports that her mother is taking care of her daughter while she is here. She denies si/hi/a/v hallucinations. She is compliant with hs medication and attended group tonight. Safety maintained on unit with 15 min checks.

## 2015-06-21 NOTE — Progress Notes (Signed)
Patient ID: Stephanie Mack, female   DOB: 10-02-1990, 25 y.o.   MRN: [id number]   Pt currently presents with a flat affect and depressed behavior. Per self inventory, pt rates depression, hopelessness and anxiety at a 0. Pt's daily goal is to "to stop being emotional all the time" and they intend to do so by "get out the room and communicate with people." Pt reports good sleep, a good appetite, normal energy and good concentration.   Pt provided with medications per providers orders. Pt's labs and vitals were monitored throughout the day. Pt supported emotionally and encouraged to express concerns and questions. Pt educated on medications.  Pt's safety ensured with 15 minute and environmental checks. Pt currently denies SI/HI and A/V hallucinations. Pt verbally agrees to seek staff if SI/HI or A/VH occurs and to consult with staff before acting on these thoughts. Pt reports that her plan is to be discharged on Tuesday, pt reports interest in OP counseling. Will continue POC.

## 2015-06-21 NOTE — Progress Notes (Signed)
Patient has been observed up in the dayroom off and on briefly. She reports that her 65yr old nephew visited her tonight and she was glad to see him. She has been showing other patients a picture of her baby daughter. She is hopeful to discharge on tomorrow. She is compliant with medication. She denies si/hi/a/v hallucinations. Safety maintained on unit with 15 min checks.

## 2015-06-21 NOTE — Progress Notes (Signed)
Patient ID: Stephanie Mack, female   DOB: [birth year], 25 y.o.   MRN: [id number]  Adult Psychoeducational Group Note  Date:  06/21/2015 Time:  09:15 AM  Group Topic/Focus:  Healthy Communication:   The focus of this group is to discuss communication, barriers to communication, as well as healthy ways to communicate with others. Healthy Support Systems  Participation Level:  Active  Participation Quality:  Minimal  Affect: Flat  Cognitive:  Appropriate  Insight: Improving  Engagement in Group:  Improving  Modes of Intervention:  Activity, Discussion, Education, Problem-solving, Role-play and Support  Additional Comments:  Pt shared insight into ineffective and effective communication used in personal life. Pt expresses intent to identify one positive support system before discharge.   Elenore Rota 06/21/2015, 10:55 AM

## 2015-06-21 NOTE — Plan of Care (Signed)
Problem: Alteration in mood Goal: LTG-Patient reports reduction in suicidal thoughts (Patient reports reduction in suicidal thoughts and is able to verbalize a safety plan for whenever patient is feeling suicidal)  Outcome: Progressing Patient currently denies suicidal ideations and reports that she will seek staff to talk to if having suicidal thoughts.

## 2015-06-22 LAB — HEMOGLOBIN A1C
HEMOGLOBIN A1C: 5.9 % — AB (ref 4.8–5.6)
Mean Plasma Glucose: 123 mg/dL

## 2015-06-22 LAB — PROLACTIN: PROLACTIN: 14.2 ng/mL (ref 4.8–23.3)

## 2015-06-22 NOTE — [facility] Group Notes (Signed)
[facility] LCSW Group Therapy  06/22/2015 1:15pm  Type of Therapy:  Group Therapy vercoming Obstacles  Participation Level:  Minimal  Participation Quality:  Attentive  Affect:  Appropriate  Cognitive:  Appropriate and Oriented  Insight:  Unable to Assess  Engagement in Therapy:  Limited  Modes of Intervention:  Discussion, Exploration, Problem-solving and Support  Description of Group:   In this group patients will be encouraged to explore what they see as obstacles to their own wellness and recovery. They will be guided to discuss their thoughts, feelings, and behaviors related to these obstacles. The group will process together ways to cope with barriers, with attention given to specific choices patients can make. Each patient will be challenged to identify changes they are motivated to make in order to overcome their obstacles. This group will be process-oriented, with patients participating in exploration of their own experiences as well as giving and receiving support and challenge from other group members.  Summary of Patient Progress: Pt did not participate in group discussion but was attentive to comments of peers.   Therapeutic Modalities:   Cognitive Behavioral Therapy Solution Focused Therapy Motivational Interviewing Relapse Prevention Therapy   Peri Maris, LCSWA 06/22/2015 3:03 PM

## 2015-06-22 NOTE — Progress Notes (Signed)
D: Pt has appropriate affect and pleasant mood.  She reports her day was "good" and "I go home tomorrow."  Pt reports she feels "good, happy" about discharge tomorrow and that she feels safe to discharge.  She reports she plans to "keep being positive, continue to use coping skills, stay on my medication" after she leaves [facility].  Pt denies SI/HI, denies hallucinations, denies pain.  Pt has been visible in milieu interacting with peers and staff appropriately.  Pt attended evening group.   A: Introduced self to pt.  Actively listened to pt and offered support and encouragement.    Medication administered per order.   R: Pt is compliant with medication.  Pt verbally contracts for safety.  Will continue to monitor and assess.

## 2015-06-22 NOTE — Progress Notes (Signed)
Adult Psychoeducational Group Note  Date:  06/22/2015 Time:  10:27 PM  Group Topic/Focus:  Wrap-Up Group:   The focus of this group is to help patients review their daily goal of treatment and discuss progress on daily workbooks.  Participation Level:  Active  Participation Quality:  Attentive  Affect:  Appropriate  Cognitive:  Appropriate  Insight: Good  Engagement in Group:  Engaged  Modes of Intervention:  Discussion  Additional Comments:  Pt had a good day. Pt goal is to keep medication as ordered.   Jerline Pain 06/22/2015, 10:27 PM

## 2015-06-22 NOTE — Progress Notes (Signed)
Recreation Therapy Notes  Date: 03.20.2017 Time: 9:30am Location: [address] Group Room   Group Topic: Stress Management  Goal Area(s) Addresses:  Patient will actively participate in stress management techniques presented during session.   Behavioral Response: Did not attend.    Laureen Ochs Gavon Majano, LRT/CTRS        Ma Munoz L 06/22/2015 2:01 PM

## 2015-06-22 NOTE — [facility] Group Notes (Signed)
[facility] LCSW Aftercare Discharge Planning Group Note  06/22/2015 8:45 AM  Participation Quality: Alert, Appropriate and Oriented  Mood/Affect: "Sleepy"  Depression Rating: 0  Anxiety Rating: 0  Thoughts of Suicide: Pt denies SI/HI  Will you contract for safety? Yes  Current AVH: Pt denies  Plan for Discharge/Comments: Pt attended discharge planning group and actively participated in group. CSW discussed suicide prevention education with the group and encouraged them to discuss discharge planning and any relevant barriers. Pt reports improved mood and good sleep patterns. She is hopeful for discharge tomorrow.  Transportation Means: Pt reports access to transportation  Supports: No supports mentioned at this time  Peri Maris, [place] 06/22/2015 9:19 AM

## 2015-06-22 NOTE — Plan of Care (Signed)
Problem: Ineffective individual coping Goal: STG: Patient will remain free from self harm Outcome: Progressing Patient has remained free from harm and safety maintained on unit with 15 min checks.
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
## 2015-06-22 NOTE — Progress Notes (Signed)
D:  Patient's self inventory sheet, patient sleeps good, sleep medication is helpful.  Good appetite, normal energy level, good concentration.  Denied depression, hopeless and anxiety.  Denied SI.  Denied physical problems.  Denied pain.  Goal is to keep doing great on coping skills and working on those skills.  Does have discharge plans. A:  Medications administered per MD orders.  Emotional support and encouragement given patient. R:  Denied SI and HI, contracts for safety.  Denied A/V hallucinations.  Safety maintained with 15 minute checks.

## 2015-06-22 NOTE — Plan of Care (Signed)
Problem: Alteration in mood Goal: LTG-Pt's behavior demonstrates decreased signs of depression (Patient's behavior demonstrates decreased signs of depression to the point the patient is safe to return home and continue treatment in an outpatient setting)  Outcome: Progressing Nurse discussed coping skills/depression with patient.

## 2015-06-23 DIAGNOSIS — F314 Bipolar disorder, current episode depressed, severe, without psychotic features: Secondary | ICD-10-CM | POA: Insufficient documentation

## 2015-06-23 MED ORDER — OLANZAPINE 5 MG PO TABS: 5.0000 mg | ORAL_TABLET | Freq: Every day | ORAL | Status: DC

## 2015-06-23 MED ORDER — SERTRALINE HCL 50 MG PO TABS: 50.0000 mg | ORAL_TABLET | Freq: Every day | ORAL | Status: DC

## 2015-06-23 MED ORDER — HYDROXYZINE HCL 25 MG PO TABS: 25.0000 mg | ORAL_TABLET | Freq: Four times a day (QID) | ORAL | Status: DC | PRN
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
## 2015-06-23 NOTE — Progress Notes (Signed)
  [facility] Adult Case Management Discharge Plan :  Will you be returning to the same living situation after discharge:  Yes,  Pt returning home At discharge, do you have transportation home?: Yes,  Pt mother to pick up Do you have the ability to pay for your medications: Yes,  Pt provided with prescriptions  Release of information consent forms completed and in the chart;  Patient's signature needed at discharge.  Patient to Follow up at: Follow-up Information    Follow up with [facility].   Specialty:  Behavioral Health   Why:  Please walk-in between 8am-3pm Monday-Friday to be seen for medication management and therapy.   Contact information:   [place] [address] [phone]       Next level of care provider has access to [place] and Suicide Prevention discussed: Yes,  with Pt; declines family contact  Have you used any form of tobacco in the last 30 days? (Cigarettes, Smokeless Tobacco, Cigars, and/or Pipes): No  Has patient been referred to the Quitline?: N/A patient is not a smoker  Patient has been referred for addiction treatment: Yes- see above  Bo Mcclintock 06/23/2015, 4:13 PM

## 2015-06-23 NOTE — [facility] Group Notes (Signed)

## 2015-06-23 NOTE — Progress Notes (Signed)
Recreation Therapy Notes  Animal-Assisted Activity (AAA) Program Checklist/Progress Notes Patient Eligibility Criteria Checklist & Daily Group note for Rec Tx Intervention  Date: 03.21.2017 Time: 2:45pm Location: [address] Dayroom    AAA/T Program Assumption of Risk Form signed by Patient/ or Parent Legal Guardian yes  Patient is free of allergies or sever asthma yes  Patient reports no fear of animals yes  Patient reports no history of cruelty to animals yes  Patient understands his/her participation is voluntary yes  Behavioral Response: Did not attend.   Hennessy Bartel L Reba Hulett, LRT/CTRS        Rocklin Soderquist L 06/23/2015 3:56 PM 
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
## 2015-06-23 NOTE — Progress Notes (Signed)
Discharge Note:  Patient discharged home with her mother and baby.  Patient denied SI and HI.  Denied A/V hallucinations.   Suicide prevention information given and discussed with patient who stated she understood and had no questions.  Patient stated she received all her belongings, clothing, toiletries, misc items, boots, prescriptions, etc.  Patient stated she appreciated all assistance received from [facility] staff.  All required  discharge papers give to patient at discharge.

## 2015-06-23 NOTE — Progress Notes (Addendum)
D:  Patient's self inventory sheet, patient sleeps good, sleep medication is helpful. Good appetite, normal energy level, good concentration.  Denied depression, hopeless and anxiety.  Denied withdrawals.  Denied SI.  Denied physical problems.  Denied pain.  Goal is to keep doing good on coping skills.  Plans to smile.  Does have discharge plans. A:  Medications administered per MD orders.  Emotional support and encouragement given patient. R:  Denied SI and HI, contracts for safety.  Denied A/V hallucinations.  Safety maintained with 15 minute checks.

## 2015-06-23 NOTE — Tx Team (Signed)
Interdisciplinary Treatment Plan Update (Adult) Date: 06/23/2015   Date: 06/23/2015 4:11 PM  Progress in Treatment:  Attending groups: Yes  Participating in groups: Yes  Taking medication as prescribed: Yes  Tolerating medication: Yes  Family/Significant othe contact made: No, Pt declines Patient understands diagnosis: Yes AEB seeking help with depression Discussing patient identified problems/goals with staff: Yes  Medical problems stabilized or resolved: Yes  Denies suicidal/homicidal ideation: Yes Patient has not harmed self or Others: Yes   New problem(s) identified: None identified at this time.   Discharge Plan or Barriers: Pt will return home and follow-up with [facility].  Additional comments:  Patient and CSW reviewed pt's identified goals and treatment plan. Patient verbalized understanding and agreed to treatment plan. CSW reviewed [facility] "Discharge Process and Patient Involvement" Form. Pt verbalized understanding of information provided and signed form.   Reason for Continuation of Hospitalization:  Anxiety Depression Medication stabilization Suicidal ideation  Estimated length of stay: 0 days  Review of initial/current patient goals per problem list:   1.  Goal(s): Patient will participate in aftercare plan  Met:  Yes  Target date: 3-5 days from date of admission   As evidenced by: Patient will participate within aftercare plan AEB aftercare provider and housing plan at discharge being identified.   06/19/15: Pt will return home and follow-up with [facility].  2.  Goal (s): Patient will exhibit decreased depressive symptoms and suicidal ideations.  Met:  Yes  Target date: 3-5 days from date of admission   As evidenced by: Patient will utilize self rating of depression at 3 or below and demonstrate decreased signs of depression or be deemed stable for discharge by MD.  06/19/15: Pt reports that her depression is improving.  06/23/15: Pt rates depression at 0/10;  denies SI  3.  Goal(s): Patient will demonstrate decreased signs and symptoms of anxiety.  Met:  Yes  Target date: 3-5 days from date of admission   As evidenced by: Patient will utilize self rating of anxiety at 3 or below and demonstrated decreased signs of anxiety, or be deemed stable for discharge by MD  06/19/15: Pt reports improving anxiety.  06/23/15: Pt rates anxiety at 0/10 Attendees:  Patient:    Family:    Physician: Dr. Parke Poisson, MD  06/23/2015 4:11 PM  Nursing: Lars Pinks, RN Case manager  06/23/2015 4:11 PM  Clinical Social Worker Peri Maris, [place] 06/23/2015 4:11 PM  Other: Tilden Fossa, [place] 06/23/2015 4:11 PM  Clinical:  Grayland Ormond, RN; Gaylan Gerold, RN 06/23/2015 4:11 PM  Other: , RN Charge Nurse 06/23/2015 4:11 PM  Other: Hilda Lias, [place], [place] Social Work [phone]

## 2015-06-26 NOTE — Progress Notes (Signed)
Pt has had inpatient status since request. Call to be filed.

## 2015-07-02 ENCOUNTER — Ambulatory Visit (INDEPENDENT_AMBULATORY_CARE_PROVIDER_SITE_OTHER): Payer: Medicaid Other | Admitting: Family Medicine

## 2015-07-02 ENCOUNTER — Encounter: Payer: Self-pay | Admitting: Family Medicine

## 2015-07-02 VITALS — BP 141/76 | HR 111 | Temp 97.8°F | Resp 14 | Ht 61.0 in | Wt 126.0 lb

## 2015-07-02 DIAGNOSIS — Z202 Contact with and (suspected) exposure to infections with a predominantly sexual mode of transmission: Secondary | ICD-10-CM

## 2015-07-02 DIAGNOSIS — R300 Dysuria: Secondary | ICD-10-CM | POA: Diagnosis not present

## 2015-07-02 DIAGNOSIS — F319 Bipolar disorder, unspecified: Secondary | ICD-10-CM

## 2015-07-02 LAB — POCT URINALYSIS DIP (DEVICE)
Bilirubin Urine: NEGATIVE
Glucose, UA: NEGATIVE mg/dL
HGB URINE DIPSTICK: NEGATIVE
KETONES UR: NEGATIVE mg/dL
Leukocytes, UA: NEGATIVE
NITRITE: NEGATIVE
PH: 6 (ref 5.0–8.0)
PROTEIN: 100 mg/dL — AB
Specific Gravity, Urine: >=1.03 (ref 1.005–1.030)
Urobilinogen, UA: 0.2 mg/dL (ref 0.0–1.0)

## 2015-07-02 NOTE — Progress Notes (Signed)
Subjective:    Patient ID: Stephanie Mack, female    DOB: [birth year], 25 y.o.   MRN: [id number]  HPI [name], a 25 year old female presents to establish care. She states that she has not had a primary provider, but had been using her gynecologist for all primary needs.   Seng has a history of bipolar disorder and complains of periodic depression and anxiety. She is currently under the care of psychiatry, but does not recall the name. She was recently.  She denies current suicidal and homicidal plan or intent.   Family history significant for depression.. Risk factors include: previous episode of depression Previous treatment includes Zoloft, Zyprexa, and individual therapy.   Patient is also complaining of intermittent dysuria. She states that she has a history of sexually transmitted disease and is concerned. She is currently not sexually active, but was greater than 1 month ago. She states that she did not use barrier protection during sexual intercourse. She currently denies fever, fatigue, weight loss, abdominal/pelvic pain, or vaginal discharge. She is currently on Depo Provera for family planning.   Past Medical History  Diagnosis Date  . Depression     depression  . Bipolar disorder ([place])   . ADHD (attention deficit hyperactivity disorder)   . Anxiety   . Anemia   . ADHD (attention deficit hyperactivity disorder)   . Bipolar 1 disorder ([place])   . Chlamydia 05-31-10  . Pseudocyesis 2013    Seen in MAU for percieved FM, abd distension. Normal exam.   . Gonorrhea contact, treated   . Headache   . Infection     UTI  . Gestational diabetes     current pregnancy  . GERD (gastroesophageal reflux disease)     with pregnancy  . Pregnancy induced hypertension     previous pregnancy   Immunization History  Administered Date(s) Administered  . Influenza,inj,Quad PF,36+ Mos 01/09/2015  . MMR 01/10/2015  . Rho (D) Immune Globulin 09/03/2012  . Tdap 01/08/2015   Social  History   Social History  . Marital Status: Single    Spouse Name: N/A  . Number of Children: N/A  . Years of Education: N/A   Occupational History  . Not on file.   Social History Main Topics  . Smoking status: Never Smoker   . Smokeless tobacco: Never Used  . Alcohol Use: No  . Drug Use: No  . Sexual Activity:    Partners: Male    Birth Control/ Protection: Injection     Comment: 1 partner   Other Topics Concern  . Not on file   Social History Narrative   Review of Systems  Constitutional: Negative.  Negative for fever and unexpected weight change.  HENT: Negative.   Eyes: Negative.  Negative for visual disturbance.  Respiratory: Negative.   Cardiovascular: Negative.   Gastrointestinal: Negative.   Endocrine: Negative.  Negative for polydipsia and polyuria.  Genitourinary: Positive for dysuria. Negative for hematuria.  Musculoskeletal: Negative.   Skin: Negative.   Allergic/Immunologic: Negative.  Negative for immunocompromised state.  Neurological: Negative.   Hematological: Negative.   Psychiatric/Behavioral: Negative.  Negative for behavioral problems and agitation.       Objective:   Physical Exam  Constitutional: She is oriented to person, place, and time.  HENT:  Head: Normocephalic and atraumatic.  Right Ear: External ear normal.  Left Ear: External ear normal.  Mouth/Throat: Oropharynx is clear and moist.  Eyes: Conjunctivae and EOM are normal. Pupils are equal,  round, and reactive to light.  Neck: Normal range of motion. Neck supple.  Cardiovascular: Normal rate, regular rhythm, normal heart sounds and intact distal pulses.   Pulmonary/Chest: Effort normal and breath sounds normal.  Abdominal: Soft. Bowel sounds are normal.  Musculoskeletal: Normal range of motion.  Neurological: She is alert and oriented to person, place, and time. She has normal reflexes.  Skin: Skin is warm and dry.  Psychiatric: She has a normal mood and affect. Her behavior  is normal. Judgment and thought content normal.       BP 141/76 mmHg  Pulse 111  Temp(Src) 97.8 F (36.6 C) (Oral)  Resp 14  Ht _0  (1.549 m)  Wt 126 lb (57.153 kg)  BMI 23.82 kg/m2  LMP  (LMP Unknown) Assessment & Plan:  1. Possible exposure to STD Reminded patient of the importance of using barrier protection with sexual intercourse, she expressed understanding.  - RPR - HIV antibody (with reflex) - GC/Chlamydia Probe Amp  2. Dysuria - GC/Chlamydia Probe Amp  3. Bipolar 1 disorder [facility]) Patient states that symptoms of depression and anxiety are controlled on current medication regimen. Reminded patient of the importance of taking medications consistently. She currently denies suicidal or homicidal intent.   4. Prediabetes Reviewed laboratory values. Current hemoglobin a1C is 5.9%. Recommend a lowfat, low carbohydrate diet divided over 5-6 small meals, increase water intake to 6-8 glasses, and 150 minutes per week of cardiovascular exercise.   Routine Health Maintenance:  Recommend monthly self breast exam Recommend routine dental and eye examinations Patient is up to date with vaccinations Follow up with gynecologist as scheduled for Depo Provera injections.    RTC: 6 months for prediabetes. Will also follow up by phone with laboratory results Stephanie Dew, FNP

## 2015-07-03 LAB — GC/CHLAMYDIA PROBE AMP
CT PROBE, AMP APTIMA: NOT DETECTED
GC PROBE AMP APTIMA: NOT DETECTED

## 2015-07-03 LAB — HIV ANTIBODY (ROUTINE TESTING W REFLEX): HIV 1&2 Ab, 4th Generation: NONREACTIVE

## 2015-07-03 LAB — RPR

## 2015-07-23 ENCOUNTER — Ambulatory Visit (INDEPENDENT_AMBULATORY_CARE_PROVIDER_SITE_OTHER): Payer: Medicaid Other | Admitting: *Deleted

## 2015-07-23 VITALS — BP 125/79 | HR 83 | Wt 130.0 lb

## 2015-07-23 DIAGNOSIS — Z3042 Encounter for surveillance of injectable contraceptive: Secondary | ICD-10-CM

## 2015-07-23 MED ORDER — MEDROXYPROGESTERONE ACETATE 150 MG/ML IM SUSP
150.0000 mg | Freq: Once | INTRAMUSCULAR | Status: AC
  Administered 2015-07-23: 150 mg via INTRAMUSCULAR

## 2015-07-23 NOTE — Progress Notes (Signed)
Patient has no concerns, states that all is well. Tolerated injection well in RUOQ. RTO:10-14-15 Given by Direce RMA

## 2015-07-28 ENCOUNTER — Ambulatory Visit (INDEPENDENT_AMBULATORY_CARE_PROVIDER_SITE_OTHER): Payer: Medicaid Other | Admitting: Obstetrics

## 2015-07-28 ENCOUNTER — Encounter: Payer: Self-pay | Admitting: Obstetrics

## 2015-07-28 VITALS — BP 122/80 | HR 83 | Wt 133.0 lb

## 2015-07-28 DIAGNOSIS — Z0000 Encounter for general adult medical examination without abnormal findings: Secondary | ICD-10-CM | POA: Diagnosis not present

## 2015-07-28 DIAGNOSIS — Z01419 Encounter for gynecological examination (general) (routine) without abnormal findings: Secondary | ICD-10-CM | POA: Diagnosis not present

## 2015-07-28 NOTE — Progress Notes (Signed)
Subjective:        Stephanie Mack is a 25 y.o. female here for a routine exam.  Current complaints: None.    Personal health questionnaire:  Is patient [name], have a family history of breast and/or ovarian cancer: no Is there a family history of uterine cancer diagnosed at age < 86, gastrointestinal cancer, urinary tract cancer, family member who is a Field seismologist syndrome-associated carrier: no Is the patient overweight and hypertensive, family history of diabetes, personal history of gestational diabetes, preeclampsia or PCOS: no Is patient over 25, have PCOS,  family history of premature CHD under age 31, diabetes, smoke, have hypertension or peripheral artery disease:  no At any time, has a partner hit, kicked or otherwise hurt or frightened you?: no Over the past 2 weeks, have you felt down, depressed or hopeless?: no Over the past 2 weeks, have you felt little interest or pleasure in doing things?:no   Gynecologic History No LMP recorded. Patient has had an injection. Contraception: Depo-Provera injections Last Pap: 2016. Results were: abnormal ( LGSIL ) Last mammogram: n/a. Results were: n/a  Obstetric History OB History  Gravida Para Term Preterm AB SAB TAB Ectopic Multiple Living  3 2 0 2 1 1 0 0 0 1     # Outcome Date GA Lbr Len/2nd Weight Sex Delivery Anes PTL Lv  3 Preterm 01/07/15 [redacted]w[redacted]d  6 lb 3.8 oz (2.829 kg) F CS-Vac Spinal  Y  2 Preterm 09/01/12 [redacted]w[redacted]d   F CS-LTranv Spinal  N  1 SAB 09/16/09 [redacted]w[redacted]d            Comments: Twin gestational sacs.       Past Medical History  Diagnosis Date  . Depression     depression  . Bipolar disorder ([place])   . ADHD (attention deficit hyperactivity disorder)   . Anxiety   . Anemia   . ADHD (attention deficit hyperactivity disorder)   . Bipolar 1 disorder ([place])   . Chlamydia 05-31-10  . Pseudocyesis 2013    Seen in MAU for percieved FM, abd distension. Normal exam.   . Gonorrhea contact, treated   . Headache   .  Infection     UTI  . Gestational diabetes     current pregnancy  . GERD (gastroesophageal reflux disease)     with pregnancy  . Pregnancy induced hypertension     previous pregnancy    Past Surgical History  Procedure Laterality Date  . Nasal septum surgery    . Cesarean section N/A 09/01/2012    Procedure:  Primary cesarean section with delivery of baby girl at 60.  Apgars 1/1.  ;  Surgeon: Frederico Hamman, MD;  Location: [facility];  Service: Obstetrics;  Laterality: N/A;  . Cesarean section N/A 01/07/2015    Procedure: REPEAT CESAREAN SECTION;  Surgeon: Shelly Bombard, MD;  Location: [facility];  Service: Obstetrics;  Laterality: N/A;     Current outpatient prescriptions:  .  hydrOXYzine (ATARAX/VISTARIL) 25 MG tablet, Take 1 tablet (25 mg total) by mouth every 6 (six) hours as needed for anxiety., Disp: 45 tablet, Rfl: 0 .  OLANZapine (ZYPREXA) 5 MG tablet, Take 1 tablet (5 mg total) by mouth at bedtime. For mood control, Disp: 30 tablet, Rfl: 0 .  sertraline (ZOLOFT) 50 MG tablet, Take 1 tablet (50 mg total) by mouth daily. For depression, Disp: 30 tablet, Rfl: 0 No Known Allergies  Social History  Substance Use Topics  . Smoking status: Never Smoker   .  Smokeless tobacco: Never Used  . Alcohol Use: No    Family History  Problem Relation Age of Onset  . Kidney disease Mother   . Hypertension Father   . Cancer Sister   . Asthma Brother   . Heart disease Neg Hx   . Diabetes Maternal Grandfather       Review of Systems  Constitutional: negative for fatigue and weight loss Respiratory: negative for cough and wheezing Cardiovascular: negative for chest pain, fatigue and palpitations Gastrointestinal: negative for abdominal pain and change in bowel habits Musculoskeletal:negative for myalgias Neurological: negative for gait problems and tremors Behavioral/Psych: negative for abusive relationship, depression Endocrine: negative for temperature intolerance    Genitourinary:negative for abnormal menstrual periods, genital lesions, hot flashes, sexual problems and vaginal discharge Integument/breast: negative for breast lump, breast tenderness, nipple discharge and skin lesion(s)    Objective:       BP 122/80 mmHg  Pulse 83  Wt 133 lb (60.328 kg) General:   alert  Skin:   no rash or abnormalities  Lungs:   clear to auscultation bilaterally  Heart:   regular rate and rhythm, S1, S2 normal, no murmur, click, rub or gallop  Breasts:   normal without suspicious masses, skin or nipple changes or axillary nodes  Abdomen:  normal findings: no organomegaly, soft, non-tender and no hernia  Pelvis:  External genitalia: normal general appearance Urinary system: urethral meatus normal and bladder without fullness, nontender Vaginal: normal without tenderness, induration or masses Cervix: normal appearance Adnexa: normal bimanual exam Uterus: anteverted and non-tender, normal size   Lab Review Urine pregnancy test Labs reviewed yes Radiologic studies reviewed no    Assessment:    Healthy female exam.    Plan:    Education reviewed: depression evaluation, low fat, low cholesterol diet, safe sex/STD prevention, self breast exams and weight bearing exercise. Contraception: Depo-Provera injections. Follow up in: 1 year.   No orders of the defined types were placed in this encounter.   Orders Placed This Encounter  Procedures  . NuSwab Vaginitis (VG)

## 2015-07-30 ENCOUNTER — Telehealth: Payer: Self-pay | Admitting: *Deleted

## 2015-07-30 LAB — NUSWAB VAGINITIS (VG)
CANDIDA GLABRATA, NAA: NEGATIVE
Candida albicans, NAA: NEGATIVE
TRICH VAG BY NAA: NEGATIVE

## 2015-07-30 MED ORDER — PRENATE STAR 20-1 MG PO TABS: 1.0000 | ORAL_TABLET | Freq: Every day | ORAL | Status: DC

## 2015-07-30 NOTE — Telephone Encounter (Signed)
Patient would like a refill of her prenatal vitamins. She takes [employer].

## 2015-07-31 LAB — PAP IG W/ RFLX HPV ASCU: PAP SMEAR COMMENT: 0

## 2015-08-10 ENCOUNTER — Ambulatory Visit: Payer: Medicaid Other | Admitting: Obstetrics

## 2015-08-11 ENCOUNTER — Ambulatory Visit (INDEPENDENT_AMBULATORY_CARE_PROVIDER_SITE_OTHER): Payer: Medicaid Other | Admitting: Obstetrics

## 2015-08-11 ENCOUNTER — Encounter: Payer: Self-pay | Admitting: Obstetrics

## 2015-08-11 VITALS — BP 134/82 | HR 85 | Temp 98.7°F | Wt 140.0 lb

## 2015-08-11 DIAGNOSIS — R102 Pelvic and perineal pain: Secondary | ICD-10-CM | POA: Diagnosis not present

## 2015-08-11 DIAGNOSIS — R1084 Generalized abdominal pain: Secondary | ICD-10-CM

## 2015-08-11 LAB — POCT URINALYSIS DIPSTICK
Bilirubin, UA: NEGATIVE
Blood, UA: NEGATIVE
Glucose, UA: 250
Ketones, UA: NEGATIVE
LEUKOCYTES UA: NEGATIVE
NITRITE UA: NEGATIVE
PH UA: 6
Spec Grav, UA: 1.02
UROBILINOGEN UA: NEGATIVE

## 2015-08-11 NOTE — Progress Notes (Signed)
Patient ID: Stephanie Mack, female   DOB: 10/25/1990, 25 y.o.   MRN: [id number]  Chief Complaint  Patient presents with  . Abdominal Pain    Blaoting and cramping since giving birth    HPI Stephanie Mack is a 25 y.o. female.  Bloating and cramping since delivery.  HPI  Past Medical History  Diagnosis Date  . Depression     depression  . Bipolar disorder ([place])   . ADHD (attention deficit hyperactivity disorder)   . Anxiety   . Anemia   . ADHD (attention deficit hyperactivity disorder)   . Bipolar 1 disorder ([place])   . Chlamydia 05-31-10  . Pseudocyesis 2013    Seen in MAU for percieved FM, abd distension. Normal exam.   . Gonorrhea contact, treated   . Headache   . Infection     UTI  . Gestational diabetes     current pregnancy  . GERD (gastroesophageal reflux disease)     with pregnancy  . Pregnancy induced hypertension     previous pregnancy    Past Surgical History  Procedure Laterality Date  . Nasal septum surgery    . Cesarean section N/A 09/01/2012    Procedure:  Primary cesarean section with delivery of baby girl at 22.  Apgars 1/1.  ;  Surgeon: Frederico Hamman, MD;  Location: [facility];  Service: Obstetrics;  Laterality: N/A;  . Cesarean section N/A 01/07/2015    Procedure: REPEAT CESAREAN SECTION;  Surgeon: Shelly Bombard, MD;  Location: [facility];  Service: Obstetrics;  Laterality: N/A;    Family History  Problem Relation Age of Onset  . Kidney disease Mother   . Hypertension Father   . Cancer Sister   . Asthma Brother   . Heart disease Neg Hx   . Diabetes Maternal Grandfather     Social History Social History  Substance Use Topics  . Smoking status: Never Smoker   . Smokeless tobacco: Never Used  . Alcohol Use: No    No Known Allergies  Current Outpatient Prescriptions  Medication Sig Dispense Refill  . hydrOXYzine (ATARAX/VISTARIL) 25 MG tablet Take 1 tablet (25 mg total) by mouth every 6 (six) hours as needed for anxiety. 45 tablet 0  .  OLANZapine (ZYPREXA) 5 MG tablet Take 1 tablet (5 mg total) by mouth at bedtime. For mood control 30 tablet 0  . Prenatal-Fe Asparto Gly-FA (PRENATE STAR) 20-1 MG TABS Take 1 tablet by mouth daily. 30 tablet 11  . sertraline (ZOLOFT) 50 MG tablet Take 1 tablet (50 mg total) by mouth daily. For depression 30 tablet 0   No current facility-administered medications for this visit.    Review of Systems Review of Systems Constitutional: negative for fatigue and weight loss Respiratory: negative for cough and wheezing Cardiovascular: negative for chest pain, fatigue and palpitations Gastrointestinal: positive for abdominal pain and bloating and occasional constipation Genitourinary:negative Integument/breast: negative for nipple discharge Musculoskeletal:negative for myalgias Neurological: negative for gait problems and tremors Behavioral/Psych: negative for abusive relationship, depression Endocrine: negative for temperature intolerance     Blood pressure 134/82, pulse 85, temperature 98.7 F (37.1 C), temperature source Oral, weight 140 lb (63.504 kg), not currently breastfeeding.  Physical Exam Physical Exam General:   alert  Skin:   no rash or abnormalities  Lungs:   clear to auscultation bilaterally  Heart:   regular rate and rhythm, S1, S2 normal, no murmur, click, rub or gallop  Breasts:   normal without suspicious masses, skin or nipple  changes or axillary nodes  Abdomen:  normal findings: no organomegaly, soft, non-tender and no hernia  Pelvis:  External genitalia: normal general appearance Urinary system: urethral meatus normal and bladder without fullness, nontender Vaginal: normal without tenderness, induration or masses Cervix: normal appearance Adnexa: normal bimanual exam Uterus: anteverted and non-tender, normal size     Data Reviewed Labs  Assessment     Abdominal - Pelvic Pain and Bloating.  Exam negative.    Plan    Abdominal - Pelvic Ultrasound  ordered.  No orders of the defined types were placed in this encounter.   No orders of the defined types were placed in this encounter.

## 2015-08-11 NOTE — Addendum Note (Signed)
Addended by: [name] D on: 08/11/2015 04:18 PM   Modules accepted: Orders

## 2015-08-14 ENCOUNTER — Other Ambulatory Visit: Payer: Self-pay | Admitting: Obstetrics

## 2015-08-15 ENCOUNTER — Other Ambulatory Visit: Payer: Self-pay | Admitting: Obstetrics

## 2015-08-15 DIAGNOSIS — B9689 Other specified bacterial agents as the cause of diseases classified elsewhere: Secondary | ICD-10-CM

## 2015-08-15 DIAGNOSIS — N760 Acute vaginitis: Principal | ICD-10-CM

## 2015-08-15 MED ORDER — METRONIDAZOLE 500 MG PO TABS: 500.0000 mg | ORAL_TABLET | Freq: Two times a day (BID) | ORAL | Status: DC

## 2015-08-17 LAB — NUSWAB VG+, CANDIDA 6SP
Atopobium vaginae: HIGH Score — AB
CANDIDA KRUSEI, NAA: NEGATIVE
CANDIDA PARAPSILOSIS, NAA: NEGATIVE
CANDIDA TROPICALIS, NAA: NEGATIVE
Candida albicans, NAA: NEGATIVE
Candida glabrata, NAA: NEGATIVE
Candida lusitaniae, NAA: NEGATIVE
Chlamydia trachomatis, NAA: NEGATIVE
Neisseria gonorrhoeae, NAA: NEGATIVE
Trich vag by NAA: NEGATIVE

## 2015-08-19 ENCOUNTER — Other Ambulatory Visit: Payer: Self-pay | Admitting: Obstetrics

## 2015-08-20 ENCOUNTER — Encounter (HOSPITAL_COMMUNITY): Payer: Self-pay

## 2015-08-20 ENCOUNTER — Inpatient Hospital Stay (HOSPITAL_COMMUNITY)
Admission: AD | Admit: 2015-08-20 | Discharge: 2015-08-20 | Disposition: A | Discharge status: Home / Self Care | Payer: Medicaid Other | Source: Ambulatory Visit | Attending: Obstetrics | Admitting: Obstetrics

## 2015-08-20 DIAGNOSIS — R102 Pelvic and perineal pain: Secondary | ICD-10-CM | POA: Insufficient documentation

## 2015-08-20 DIAGNOSIS — Z3202 Encounter for pregnancy test, result negative: Secondary | ICD-10-CM | POA: Diagnosis not present

## 2015-08-20 DIAGNOSIS — F418 Other specified anxiety disorders: Secondary | ICD-10-CM | POA: Insufficient documentation

## 2015-08-20 DIAGNOSIS — K219 Gastro-esophageal reflux disease without esophagitis: Secondary | ICD-10-CM | POA: Diagnosis not present

## 2015-08-20 DIAGNOSIS — O26891 Other specified pregnancy related conditions, first trimester: Secondary | ICD-10-CM | POA: Diagnosis not present

## 2015-08-20 LAB — WET PREP, GENITAL
CLUE CELLS WET PREP: NONE SEEN
Sperm: NONE SEEN
Trich, Wet Prep: NONE SEEN
Yeast Wet Prep HPF POC: NONE SEEN

## 2015-08-20 LAB — URINALYSIS, ROUTINE W REFLEX MICROSCOPIC
Bilirubin Urine: NEGATIVE
Glucose, UA: NEGATIVE mg/dL
HGB URINE DIPSTICK: NEGATIVE
KETONES UR: NEGATIVE mg/dL
LEUKOCYTES UA: NEGATIVE
Nitrite: NEGATIVE
PROTEIN: 100 mg/dL — AB
Specific Gravity, Urine: >1.03 — ABNORMAL HIGH (ref 1.005–1.030)
pH: 6 (ref 5.0–8.0)

## 2015-08-20 LAB — CBC
HEMATOCRIT: 35.3 % — AB (ref 36.0–46.0)
Hemoglobin: 11.6 g/dL — ABNORMAL LOW (ref 12.0–15.0)
MCH: 25.7 pg — AB (ref 26.0–34.0)
MCHC: 32.9 g/dL (ref 30.0–36.0)
MCV: 78.3 fL (ref 78.0–100.0)
PLATELETS: 186 10*3/uL (ref 150–400)
RBC: 4.51 MIL/uL (ref 3.87–5.11)
RDW: 13.8 % (ref 11.5–15.5)
WBC: 6.2 10*3/uL (ref 4.0–10.5)

## 2015-08-20 LAB — URINE MICROSCOPIC-ADD ON

## 2015-08-20 LAB — POCT PREGNANCY, URINE: PREG TEST UR: NEGATIVE

## 2015-08-20 MED ORDER — KETOROLAC TROMETHAMINE 60 MG/2ML IM SOLN
60.0000 mg | Freq: Once | INTRAMUSCULAR | Status: AC
  Administered 2015-08-20: 60 mg via INTRAMUSCULAR
  Filled 2015-08-20: qty 2

## 2015-08-20 MED ORDER — IBUPROFEN 800 MG PO TABS: 800.0000 mg | ORAL_TABLET | Freq: Three times a day (TID) | ORAL | Status: DC | PRN

## 2015-08-20 NOTE — MAU Note (Signed)
Pt reports the bottom of her stomach is hurting x 3 days, also reports swelling in her legs and feet. Denies nausea, vomiting , diarrhea of fever.

## 2015-08-20 NOTE — MAU Provider Note (Signed)
History     CSN: [id number]  Arrival date and time: 08/20/15 2152   First Provider Initiated Contact with Patient 08/20/15 2220      Chief Complaint  Patient presents with  . Pelvic Pain   Pelvic Pain The patient's primary symptoms include pelvic pain. This is a new problem. The current episode started 1 to 4 weeks ago (started getting worse 3 days ago. ). The problem occurs intermittently. The problem has been gradually worsening. Pain severity now: 7/10  The problem affects both sides. She is pregnant. Associated symptoms include abdominal pain. Pertinent negatives include no chills, constipation, diarrhea, dysuria, fever, frequency, nausea, urgency or vomiting. The vaginal discharge was normal. Nothing aggravates the symptoms. She has tried nothing for the symptoms. She is not sexually active. She uses progestin injections for contraception. Her menstrual history has been irregular (LMP unknown with depo. ).    Past Medical History  Diagnosis Date  . Depression     depression  . Bipolar disorder ([place])   . ADHD (attention deficit hyperactivity disorder)   . Anxiety   . Anemia   . ADHD (attention deficit hyperactivity disorder)   . Bipolar 1 disorder ([place])   . Chlamydia 05-31-10  . Pseudocyesis 2013    Seen in MAU for percieved FM, abd distension. Normal exam.   . Gonorrhea contact, treated   . Headache   . Infection     UTI  . Gestational diabetes     current pregnancy  . GERD (gastroesophageal reflux disease)     with pregnancy  . Pregnancy induced hypertension     previous pregnancy    Past Surgical History  Procedure Laterality Date  . Nasal septum surgery    . Cesarean section N/A 09/01/2012    Procedure:  Primary cesarean section with delivery of baby girl at 86.  Apgars 1/1.  ;  Surgeon: Frederico Hamman, MD;  Location: [facility];  Service: Obstetrics;  Laterality: N/A;  . Cesarean section N/A 01/07/2015    Procedure: REPEAT CESAREAN SECTION;  Surgeon: Shelly Bombard, MD;  Location: [facility];  Service: Obstetrics;  Laterality: N/A;    Family History  Problem Relation Age of Onset  . Kidney disease Mother   . Hypertension Father   . Cancer Sister   . Asthma Brother   . Heart disease Neg Hx   . Diabetes Maternal Grandfather     Social History  Substance Use Topics  . Smoking status: Never Smoker   . Smokeless tobacco: Never Used  . Alcohol Use: No    Allergies: No Known Allergies  Prescriptions prior to admission  Medication Sig Dispense Refill Last Dose  . hydrOXYzine (ATARAX/VISTARIL) 25 MG tablet Take 1 tablet (25 mg total) by mouth every 6 (six) hours as needed for anxiety. 45 tablet 0 Taking  . ibuprofen (ADVIL,MOTRIN) 600 MG tablet TAKE 1 TABLET BY MOUTH EVERY 6 HOURS AS NEEDED FOR MILD PAIN 30 tablet 0   . metroNIDAZOLE (FLAGYL) 500 MG tablet Take 1 tablet (500 mg total) by mouth 2 (two) times daily. 14 tablet 2   . OLANZapine (ZYPREXA) 5 MG tablet Take 1 tablet (5 mg total) by mouth at bedtime. For mood control 30 tablet 0 Taking  . Prenatal-Fe Asparto Gly-FA (PRENATE STAR) 20-1 MG TABS Take 1 tablet by mouth daily. 30 tablet 11 Taking  . sertraline (ZOLOFT) 50 MG tablet Take 1 tablet (50 mg total) by mouth daily. For depression 30 tablet 0 Taking  Review of Systems  Constitutional: Negative for fever and chills.  Gastrointestinal: Positive for abdominal pain. Negative for nausea, vomiting, diarrhea and constipation.  Genitourinary: Positive for pelvic pain. Negative for dysuria, urgency and frequency.   Physical Exam   Blood pressure 135/88, pulse 97, temperature 98.4 F (36.9 C), temperature source Oral, resp. rate 18, height 5\' 1"  (1.549 m), weight 64.864 kg (143 lb), SpO2 100 %, not currently breastfeeding.  Physical Exam  Nursing note and vitals reviewed. Constitutional: She is oriented to person, place, and time. She appears well-developed and well-nourished. No distress.  HENT:  Head: Normocephalic.   Cardiovascular: Normal rate.   Respiratory: Effort normal.  GI: Soft. There is no tenderness. There is no rebound.  Genitourinary:   External: no lesion Vagina: small amount of white discharge Cervix: pink, smooth, no CMT Uterus: NSSC Adnexa: NT   Neurological: She is alert and oriented to person, place, and time.  Skin: Skin is warm and dry.  Psychiatric: She has a normal mood and affect.   Results for orders placed or performed during the hospital encounter of 08/20/15 (from the past 24 hour(s))  Urinalysis, Routine w reflex microscopic (not at [facility])     Status: Abnormal   Collection Time: 08/20/15 10:07 PM  Result Value Ref Range   Color, Urine YELLOW YELLOW   APPearance CLEAR CLEAR   Specific Gravity, Urine >1.030 (H) 1.005 - 1.030   pH 6.0 5.0 - 8.0   Glucose, UA NEGATIVE NEGATIVE mg/dL   Hgb urine dipstick NEGATIVE NEGATIVE   Bilirubin Urine NEGATIVE NEGATIVE   Ketones, ur NEGATIVE NEGATIVE mg/dL   Protein, ur 100 (A) NEGATIVE mg/dL   Nitrite NEGATIVE NEGATIVE   Leukocytes, UA NEGATIVE NEGATIVE  Urine microscopic-add on     Status: Abnormal   Collection Time: 08/20/15 10:07 PM  Result Value Ref Range   Squamous Epithelial / LPF 0-5 (A) NONE SEEN   WBC, UA 0-5 0 - 5 WBC/hpf   RBC / HPF 0-5 0 - 5 RBC/hpf   Bacteria, UA FEW (A) NONE SEEN  Pregnancy, urine POC     Status: None   Collection Time: 08/20/15 10:20 PM  Result Value Ref Range   Preg Test, Ur NEGATIVE NEGATIVE  Wet prep, genital     Status: Abnormal   Collection Time: 08/20/15 10:25 PM  Result Value Ref Range   Yeast Wet Prep HPF POC NONE SEEN NONE SEEN   Trich, Wet Prep NONE SEEN NONE SEEN   Clue Cells Wet Prep HPF POC NONE SEEN NONE SEEN   WBC, Wet Prep HPF POC FEW (A) NONE SEEN   Sperm NONE SEEN   CBC     Status: Abnormal   Collection Time: 08/20/15 11:02 PM  Result Value Ref Range   WBC 6.2 4.0 - 10.5 K/uL   RBC 4.51 3.87 - 5.11 MIL/uL   Hemoglobin 11.6 (L) 12.0 - 15.0 g/dL   HCT 35.3 (L) 36.0  - 46.0 %   MCV 78.3 78.0 - 100.0 fL   MCH 25.7 (L) 26.0 - 34.0 pg   MCHC 32.9 30.0 - 36.0 g/dL   RDW 13.8 11.5 - 15.5 %   Platelets 186 150 - 400 K/uL    MAU Course  Procedures  MDM Patient has had toradol. She reports feeling better. Sleeping comfortably.   Assessment and Plan   1. Pelvic pain in female    DC home Comfort measures reviewed  RX: ibuprofen 800mg  PRN  Return to MAU as needed  FU with OB as planned  Follow-up Information    Follow up with [facility].   Specialty:  Radiology   Why:  As scheduled   Contact information:   [address] [id number] [place] [phone]      Follow up with Shelly Bombard, MD.   Specialty:  Obstetrics and Gynecology   Why:  As scheduled   Contact information:   [place] [address] [phone]        Mathis Bud 08/20/2015, 10:22 PM

## 2015-08-20 NOTE — Discharge Instructions (Signed)

## 2015-08-21 LAB — GC/CHLAMYDIA PROBE AMP (~~LOC~~) NOT AT ARMC
CHLAMYDIA, DNA PROBE: NEGATIVE
NEISSERIA GONORRHEA: NEGATIVE

## 2015-08-22 LAB — RPR: RPR: NONREACTIVE

## 2015-08-22 LAB — HIV ANTIBODY (ROUTINE TESTING W REFLEX): HIV SCREEN 4TH GENERATION: NONREACTIVE

## 2015-08-22 LAB — URINE CULTURE

## 2015-08-25 ENCOUNTER — Ambulatory Visit: Payer: Medicaid Other | Admitting: Obstetrics

## 2015-08-25 ENCOUNTER — Encounter: Payer: Self-pay | Admitting: Obstetrics

## 2015-08-25 ENCOUNTER — Ambulatory Visit (INDEPENDENT_AMBULATORY_CARE_PROVIDER_SITE_OTHER): Payer: Medicaid Other | Admitting: Obstetrics

## 2015-08-25 ENCOUNTER — Ambulatory Visit (INDEPENDENT_AMBULATORY_CARE_PROVIDER_SITE_OTHER): Payer: Medicaid Other

## 2015-08-25 VITALS — BP 126/87 | HR 86 | Wt 142.0 lb

## 2015-08-25 DIAGNOSIS — R102 Pelvic and perineal pain: Secondary | ICD-10-CM

## 2015-08-25 DIAGNOSIS — Z3042 Encounter for surveillance of injectable contraceptive: Secondary | ICD-10-CM

## 2015-08-25 NOTE — Progress Notes (Signed)
Patient ID: Stephanie Mack, female   DOB: 07-26-1990, 25 y.o.   MRN: [id number]  Chief Complaint  Patient presents with  . Follow-up    Pelvic pain - Ultrasound this AM    HPI Stephanie Mack is a 25 y.o. female.  H/O pelvic pain.  Improved after treatment with antibiotics.   HPI  Past Medical History  Diagnosis Date  . Depression     depression  . Bipolar disorder ([place])   . ADHD (attention deficit hyperactivity disorder)   . Anxiety   . Anemia   . ADHD (attention deficit hyperactivity disorder)   . Bipolar 1 disorder ([place])   . Chlamydia 05-31-10  . Pseudocyesis 2013    Seen in MAU for percieved FM, abd distension. Normal exam.   . Gonorrhea contact, treated   . Headache   . Infection     UTI  . Gestational diabetes     current pregnancy  . GERD (gastroesophageal reflux disease)     with pregnancy  . Pregnancy induced hypertension     previous pregnancy    Past Surgical History  Procedure Laterality Date  . Nasal septum surgery    . Cesarean section N/A 09/01/2012    Procedure:  Primary cesarean section with delivery of baby girl at 42.  Apgars 1/1.  ;  Surgeon: Frederico Hamman, MD;  Location: [facility];  Service: Obstetrics;  Laterality: N/A;  . Cesarean section N/A 01/07/2015    Procedure: REPEAT CESAREAN SECTION;  Surgeon: Shelly Bombard, MD;  Location: [facility];  Service: Obstetrics;  Laterality: N/A;    Family History  Problem Relation Age of Onset  . Kidney disease Mother   . Hypertension Father   . Cancer Sister   . Asthma Brother   . Heart disease Neg Hx   . Diabetes Maternal Grandfather     Social History Social History  Substance Use Topics  . Smoking status: Never Smoker   . Smokeless tobacco: Never Used  . Alcohol Use: No    No Known Allergies  Current Outpatient Prescriptions  Medication Sig Dispense Refill  . hydrOXYzine (ATARAX/VISTARIL) 25 MG tablet Take 1 tablet (25 mg total) by mouth every 6 (six) hours as needed for anxiety. 45  tablet 0  . ibuprofen (ADVIL,MOTRIN) 800 MG tablet Take 1 tablet (800 mg total) by mouth every 8 (eight) hours as needed. 30 tablet 0  . OLANZapine (ZYPREXA) 5 MG tablet Take 1 tablet (5 mg total) by mouth at bedtime. For mood control 30 tablet 0  . Prenatal-Fe Asparto Gly-FA (PRENATE STAR) 20-1 MG TABS Take 1 tablet by mouth daily. 30 tablet 11  . sertraline (ZOLOFT) 50 MG tablet Take 1 tablet (50 mg total) by mouth daily. For depression 30 tablet 0  . metroNIDAZOLE (FLAGYL) 500 MG tablet Take 1 tablet (500 mg total) by mouth 2 (two) times daily. (Patient not taking: Reported on 08/25/2015) 14 tablet 2   No current facility-administered medications for this visit.    Review of Systems Review of Systems Constitutional: negative for fatigue and weight loss Respiratory: negative for cough and wheezing Cardiovascular: negative for chest pain, fatigue and palpitations Gastrointestinal: negative for abdominal pain and change in bowel habits Genitourinary: positive for painful periods Integument/breast: negative for nipple discharge Musculoskeletal:negative for myalgias Neurological: negative for gait problems and tremors Behavioral/Psych: negative for abusive relationship, depression Endocrine: negative for temperature intolerance     Blood pressure 126/87, pulse 86, weight 142 lb (64.411 kg), not currently breastfeeding.  Physical Exam: Deferred  Physical Exam                                                                                                                                                                                                                                                            100% of 10 min visit spent on counseling and coordination of care.   Data Reviewed Ultrasound:  Uterus has features c/w adenomyosis.  Ovaries has PCOS changes.  Assessment     Pelvic pain  AUB Contraception.  Pleased with Depo Provera    Plan    Continue Depo Provera   Ibuprofen Rx. F/U 6 weeks  No orders of the defined types were placed in this encounter.   No orders of the defined types were placed in this encounter.

## 2015-08-26 ENCOUNTER — Encounter: Payer: Self-pay | Admitting: Obstetrics

## 2015-09-01 ENCOUNTER — Telehealth: Payer: Self-pay | Admitting: *Deleted

## 2015-09-01 NOTE — Telephone Encounter (Signed)
Patient states Dr Jodi Mourning told her she had adenomyosis and she wants to know what to do about it. She is having pain and swelling in her abdomen.

## 2015-09-15 ENCOUNTER — Encounter: Payer: Self-pay | Admitting: Obstetrics

## 2015-09-15 ENCOUNTER — Ambulatory Visit (INDEPENDENT_AMBULATORY_CARE_PROVIDER_SITE_OTHER): Payer: Medicaid Other | Admitting: Obstetrics

## 2015-09-15 ENCOUNTER — Other Ambulatory Visit: Payer: Self-pay | Admitting: Obstetrics

## 2015-09-15 VITALS — BP 129/90 | HR 113 | Wt 144.0 lb

## 2015-09-15 DIAGNOSIS — N946 Dysmenorrhea, unspecified: Secondary | ICD-10-CM

## 2015-09-15 DIAGNOSIS — Z1389 Encounter for screening for other disorder: Secondary | ICD-10-CM | POA: Diagnosis not present

## 2015-09-15 LAB — POCT URINALYSIS DIPSTICK
BILIRUBIN UA: NEGATIVE
Glucose, UA: 500
KETONES UA: NEGATIVE
Leukocytes, UA: NEGATIVE
NITRITE UA: NEGATIVE
PH UA: 5
RBC UA: NEGATIVE
Spec Grav, UA: 1.02
Urobilinogen, UA: NEGATIVE

## 2015-09-15 MED ORDER — IBUPROFEN 800 MG PO TABS: 800.0000 mg | ORAL_TABLET | Freq: Three times a day (TID) | ORAL | Status: DC | PRN

## 2015-09-16 ENCOUNTER — Encounter: Payer: Self-pay | Admitting: Obstetrics

## 2015-09-16 NOTE — Progress Notes (Signed)
Patient ID: Stephanie Mack, female   DOB: Aug 25, 1990, 25 y.o.   MRN: [id number]  Chief Complaint  Patient presents with  . Endometriosis    PAIN    HPI Stephanie Mack is a 25 y.o. female.  Complains of painful cramping.  Patient concerned that ultrasound had findings c/w adenomyosis.  HPI  Past Medical History  Diagnosis Date  . Depression     depression  . Bipolar disorder ([place])   . ADHD (attention deficit hyperactivity disorder)   . Anxiety   . Anemia   . ADHD (attention deficit hyperactivity disorder)   . Bipolar 1 disorder ([place])   . Chlamydia 05-31-10  . Pseudocyesis 2013    Seen in MAU for percieved FM, abd distension. Normal exam.   . Gonorrhea contact, treated   . Headache   . Infection     UTI  . Gestational diabetes     current pregnancy  . GERD (gastroesophageal reflux disease)     with pregnancy  . Pregnancy induced hypertension     previous pregnancy    Past Surgical History  Procedure Laterality Date  . Nasal septum surgery    . Cesarean section N/A 09/01/2012    Procedure:  Primary cesarean section with delivery of baby girl at 21.  Apgars 1/1.  ;  Surgeon: Frederico Hamman, MD;  Location: [facility];  Service: Obstetrics;  Laterality: N/A;  . Cesarean section N/A 01/07/2015    Procedure: REPEAT CESAREAN SECTION;  Surgeon: Shelly Bombard, MD;  Location: [facility];  Service: Obstetrics;  Laterality: N/A;    Family History  Problem Relation Age of Onset  . Kidney disease Mother   . Hypertension Father   . Cancer Sister   . Asthma Brother   . Heart disease Neg Hx   . Diabetes Maternal Grandfather     Social History Social History  Substance Use Topics  . Smoking status: Never Smoker   . Smokeless tobacco: Never Used  . Alcohol Use: No    No Known Allergies  Current Outpatient Prescriptions  Medication Sig Dispense Refill  . hydrOXYzine (ATARAX/VISTARIL) 25 MG tablet Take 1 tablet (25 mg total) by mouth every 6 (six) hours as needed for  anxiety. 45 tablet 0  . ibuprofen (ADVIL,MOTRIN) 800 MG tablet Take 1 tablet (800 mg total) by mouth every 8 (eight) hours as needed for cramping. 30 tablet 5  . metroNIDAZOLE (FLAGYL) 500 MG tablet Take 1 tablet (500 mg total) by mouth 2 (two) times daily. 14 tablet 2  . OLANZapine (ZYPREXA) 5 MG tablet Take 1 tablet (5 mg total) by mouth at bedtime. For mood control 30 tablet 0  . Prenatal-Fe Asparto Gly-FA (PRENATE STAR) 20-1 MG TABS Take 1 tablet by mouth daily. 30 tablet 11  . sertraline (ZOLOFT) 50 MG tablet Take 1 tablet (50 mg total) by mouth daily. For depression 30 tablet 0   No current facility-administered medications for this visit.    Review of Systems Review of Systems Constitutional: negative for fatigue and weight loss Respiratory: negative for cough and wheezing Cardiovascular: negative for chest pain, fatigue and palpitations Gastrointestinal: negative for abdominal pain and change in bowel habits Genitourinary:positive for painful cramping Integument/breast: negative for nipple discharge Musculoskeletal:negative for myalgias Neurological: negative for gait problems and tremors Behavioral/Psych: negative for abusive relationship, depression Endocrine: negative for temperature intolerance     Blood pressure 129/90, pulse 113, weight 144 lb (65.318 kg), not currently breastfeeding.  Physical Exam Physical Exam General:  alert  Skin:   no rash or abnormalities  Lungs:   clear to auscultation bilaterally  Heart:   regular rate and rhythm, S1, S2 normal, no murmur, click, rub or gallop  Breasts:   normal without suspicious masses, skin or nipple changes or axillary nodes  Abdomen:  normal findings: no organomegaly, soft, non-tender and no hernia  Pelvis:  External genitalia: normal general appearance Urinary system: urethral meatus normal and bladder without fullness, nontender Vaginal: normal without tenderness, induration or masses Cervix: normal  appearance Adnexa: normal bimanual exam Uterus: anteverted and non-tender, normal size      Data Reviewed Ultrasound  Assessment     Dysmenorrhea     Plan    Ibuprofen Rx F/U 3 months  Orders Placed This Encounter  Procedures  . POCT urinalysis dipstick   Meds ordered this encounter  Medications  . ibuprofen (ADVIL,MOTRIN) 800 MG tablet    Sig: Take 1 tablet (800 mg total) by mouth every 8 (eight) hours as needed for cramping.    Dispense:  30 tablet    Refill:  5

## 2015-09-17 NOTE — Telephone Encounter (Signed)
Review for refill. 

## 2015-09-18 LAB — NUSWAB VG+, CANDIDA 6SP
Candida albicans, NAA: NEGATIVE
Candida glabrata, NAA: NEGATIVE
Candida krusei, NAA: NEGATIVE
Candida lusitaniae, NAA: NEGATIVE
Candida parapsilosis, NAA: NEGATIVE
Candida tropicalis, NAA: NEGATIVE
Chlamydia trachomatis, NAA: NEGATIVE
Neisseria gonorrhoeae, NAA: NEGATIVE
Trich vag by NAA: NEGATIVE

## 2015-09-20 ENCOUNTER — Encounter (HOSPITAL_COMMUNITY): Payer: Self-pay | Admitting: Emergency Medicine

## 2015-09-20 ENCOUNTER — Emergency Department (HOSPITAL_COMMUNITY)
Admission: EM | Admit: 2015-09-20 | Discharge: 2015-09-20 | Discharge status: Against Medical Advice | Payer: Medicaid Other | Attending: Emergency Medicine | Admitting: Emergency Medicine

## 2015-09-20 DIAGNOSIS — D259 Leiomyoma of uterus, unspecified: Secondary | ICD-10-CM | POA: Insufficient documentation

## 2015-09-20 DIAGNOSIS — F909 Attention-deficit hyperactivity disorder, unspecified type: Secondary | ICD-10-CM | POA: Diagnosis not present

## 2015-09-20 DIAGNOSIS — R102 Pelvic and perineal pain: Secondary | ICD-10-CM

## 2015-09-20 DIAGNOSIS — F319 Bipolar disorder, unspecified: Secondary | ICD-10-CM | POA: Insufficient documentation

## 2015-09-20 DIAGNOSIS — R1031 Right lower quadrant pain: Secondary | ICD-10-CM | POA: Diagnosis present

## 2015-09-20 LAB — URINE MICROSCOPIC-ADD ON

## 2015-09-20 LAB — URINALYSIS, ROUTINE W REFLEX MICROSCOPIC
BILIRUBIN URINE: NEGATIVE
HGB URINE DIPSTICK: NEGATIVE
KETONES UR: NEGATIVE mg/dL
Leukocytes, UA: NEGATIVE
Nitrite: NEGATIVE
PROTEIN: 30 mg/dL — AB
Specific Gravity, Urine: 1.034 — ABNORMAL HIGH (ref 1.005–1.030)
pH: 6 (ref 5.0–8.0)

## 2015-09-20 LAB — POC URINE PREG, ED: PREG TEST UR: NEGATIVE

## 2015-09-20 LAB — WET PREP, GENITAL
Sperm: NONE SEEN
Trich, Wet Prep: NONE SEEN
YEAST WET PREP: NONE SEEN

## 2015-09-20 MED ORDER — IBUPROFEN 800 MG PO TABS
800.0000 mg | ORAL_TABLET | Freq: Once | ORAL | Status: AC
  Administered 2015-09-20: 800 mg via ORAL
  Filled 2015-09-20: qty 1

## 2015-09-20 NOTE — ED Notes (Addendum)
Patient here with 2 months of ongoing uterine/pelvic pain. Has had u/s and was diagnosed with adenomyosis and taking ibuprofen with no relief, saw GYN Stephanie Mack on 6/14 again for same

## 2015-09-20 NOTE — ED Provider Notes (Signed)
CSN: [id number]     Arrival date & time 09/20/15  1302 History   First MD Initiated Contact with Patient 09/20/15 1514     Chief Complaint  Patient presents with  . uterine pain    HPI Comments: 25 y.o. Female with a hx of depression, bipolar disorder, anxiety, GERD presents to the ED due to persistent pelvic pain x 3 weeks. She was diagnosed with fibroids recently and has been prescribed ibuprofen by her ob/gyn. She states this isn't helping. No associated nausea, vomiting, vaginal bleeding or discharge, fever. Endorses chills. Last dose of ibuprofen was last night.   Patient is a 25 y.o. female presenting with abdominal pain. The history is provided by the patient.  Abdominal Pain Pain location:  RLQ and suprapubic Pain quality: fullness and sharp   Pain radiates to:  Does not radiate Pain severity:  Severe Onset quality:  Gradual Duration:  3 weeks Timing:  Constant Progression:  Unchanged Chronicity:  New Context comment:  Diagnosed with uterine fibroids Relieved by:  Nothing Worsened by:  Nothing tried Ineffective treatments:  NSAIDs Associated symptoms: chills   Associated symptoms: no chest pain, no dysuria, no fever, no nausea, no shortness of breath, no vaginal bleeding, no vaginal discharge and no vomiting     Past Medical History  Diagnosis Date  . Depression     depression  . Bipolar disorder ([place])   . ADHD (attention deficit hyperactivity disorder)   . Anxiety   . Anemia   . ADHD (attention deficit hyperactivity disorder)   . Bipolar 1 disorder ([place])   . Chlamydia 05-31-10  . Pseudocyesis 2013    Seen in MAU for percieved FM, abd distension. Normal exam.   . Gonorrhea contact, treated   . Headache   . Infection     UTI  . Gestational diabetes     current pregnancy  . GERD (gastroesophageal reflux disease)     with pregnancy  . Pregnancy induced hypertension     previous pregnancy   Past Surgical History  Procedure Laterality Date  . Nasal septum  surgery    . Cesarean section N/A 09/01/2012    Procedure:  Primary cesarean section with delivery of baby girl at 78.  Apgars 1/1.  ;  Surgeon: Frederico Hamman, MD;  Location: [facility];  Service: Obstetrics;  Laterality: N/A;  . Cesarean section N/A 01/07/2015    Procedure: REPEAT CESAREAN SECTION;  Surgeon: Shelly Bombard, MD;  Location: [facility];  Service: Obstetrics;  Laterality: N/A;   Family History  Problem Relation Age of Onset  . Kidney disease Mother   . Hypertension Father   . Cancer Sister   . Asthma Brother   . Heart disease Neg Hx   . Diabetes Maternal Grandfather    Social History  Substance Use Topics  . Smoking status: Never Smoker   . Smokeless tobacco: Never Used  . Alcohol Use: No   OB History    Gravida Para Term Preterm AB TAB SAB Ectopic Multiple Living   3 2 0 2 1 0 1 0 0 1      Review of Systems  Constitutional: Positive for chills. Negative for fever and appetite change.  HENT: Negative for congestion.   Eyes: Negative for redness.  Respiratory: Negative for shortness of breath.   Cardiovascular: Negative for chest pain.  Gastrointestinal: Positive for abdominal pain. Negative for nausea and vomiting.  Genitourinary: Negative for dysuria, vaginal bleeding and vaginal discharge.  Musculoskeletal: Negative for gait  problem and neck stiffness.  Skin: Negative for rash.  Neurological: Negative for speech difficulty.  Psychiatric/Behavioral: Negative for confusion.    Allergies  Review of patient's allergies indicates no known allergies.  Home Medications   Prior to Admission medications   Medication Sig Start Date End Date Taking? Authorizing Provider  hydrOXYzine (ATARAX/VISTARIL) 25 MG tablet Take 1 tablet (25 mg total) by mouth every 6 (six) hours as needed for anxiety. 06/23/15  Yes Encarnacion Slates, NP  ibuprofen (ADVIL,MOTRIN) 800 MG tablet Take 1 tablet (800 mg total) by mouth every 8 (eight) hours as needed for cramping. 09/15/15  Yes Shelly Bombard, MD  OLANZapine (ZYPREXA) 5 MG tablet Take 1 tablet (5 mg total) by mouth at bedtime. For mood control 06/23/15  Yes Encarnacion Slates, NP  Prenatal-Fe Asparto Gly-FA (PRENATE STAR) 20-1 MG TABS Take 1 tablet by mouth daily. 07/30/15  Yes Shelly Bombard, MD  sertraline (ZOLOFT) 50 MG tablet Take 1 tablet (50 mg total) by mouth daily. For depression 06/23/15  Yes Encarnacion Slates, NP  metroNIDAZOLE (FLAGYL) 500 MG tablet Take 1 tablet (500 mg total) by mouth 2 (two) times daily. Patient not taking: Reported on 09/20/2015 08/15/15   Shelly Bombard, MD   BP 130/80 mmHg  Pulse 78  Temp(Src) 99.1 F (37.3 C) (Oral)  Resp 16  SpO2 98% Physical Exam  Constitutional: She is oriented to person, place, and time. She appears well-developed and well-nourished. No distress.  HENT:  Head: Normocephalic and atraumatic.  Right Ear: External ear normal.  Left Ear: External ear normal.  Neck: Normal range of motion.  Cardiovascular: Normal rate and regular rhythm.   Pulmonary/Chest: Effort normal and breath sounds normal.  Abdominal: Soft. There is no tenderness. There is no rebound and no guarding.  Lower abdominal fullness, but otherwise completely benign abdomen  Genitourinary: Cervix exhibits no motion tenderness and no friability. Right adnexum displays no mass, no tenderness and no fullness. Left adnexum displays no mass, no tenderness and no fullness. Vaginal discharge found.  Chaperone present, small amount of white vaginal discharge; no CMT, no adnexal tenderness  Neurological: She is alert and oriented to person, place, and time.  Skin: Skin is warm and dry. She is not diaphoretic. No pallor.  Vitals reviewed.   ED Course  Procedures   Labs Review Labs Reviewed  WET PREP, GENITAL - Abnormal; Notable for the following:    Clue Cells Wet Prep HPF POC PRESENT (*)    WBC, Wet Prep HPF POC MODERATE (*)    All other components within normal limits  URINALYSIS, ROUTINE W REFLEX MICROSCOPIC  (NOT AT [facility]) - Abnormal; Notable for the following:    APPearance CLOUDY (*)    Specific Gravity, Urine 1.034 (*)    Glucose, UA >1000 (*)    Protein, ur 30 (*)    All other components within normal limits  URINE MICROSCOPIC-ADD ON - Abnormal; Notable for the following:    Squamous Epithelial / LPF 6-30 (*)    Bacteria, UA FEW (*)    All other components within normal limits  POC URINE PREG, ED  GC/CHLAMYDIA PROBE AMP ([place]) NOT AT [facility]    Imaging Review No results found. I have personally reviewed and evaluated these images and lab results as part of my medical decision-making.   EKG Interpretation None      MDM   Final diagnoses:  Pelvic pain in female    25 y.o. female presents  with ongoing pelvic pain. Has been diagnosed with fibroids, and this pain feels the same as her fibroid pain that she has been experiencing for weeks. Denies vaginal bleeding or discharge. Remainder of HPI, ROS, and Exam as above.   Exam notable for an enlarged uterus, with no CMT or adnexal tenderness. She is well appearing and in no distress. Benign abdominal exam.   She is not pregnant. UA showed no evidence of UTI.   I do not suspect ectopic pregnancy, PID, ovarian torsion, TOA, UTI or any other acute intraabdominal/pelvic process at this time. Her presentation is consistent with pain associated with fibroids.   She has not had any medication for pain today, including the ibuprofen that is prescribed for her. I discussed this with her, and ordered her a dose of ibuprofen.   On reassessment, she reports that the ibuprofen did help. We discussed taking it regularly with food for the next 5 days.   Patient eloped from the ED prior to me being able to give her discharge instructions and return precautions.   Case managed in conjunction with my attending, Dr. Maryan Rued.    Berenice Primas, MD 09/20/15 [id number]  Blanchie Dessert, MD 09/21/15 [phone]

## 2015-09-20 NOTE — ED Notes (Signed)
Pt family member c/o delay in pelvic exam. This RN apologized for delay and explained EDPs will be in shortly.

## 2015-09-20 NOTE — Discharge Instructions (Signed)
Take 800 mg ibuprofen by mouth every 8 hours with food for the next 5 days.  Follow up with your gynecologist.  Return to the ER as needed.   Uterine Fibroids Uterine fibroids are tissue masses (tumors). They are also called leiomyomas. They can develop inside of a woman's womb (uterus). They can grow very large. Fibroids are not cancerous (benign). Most fibroids do not require medical treatment. HOME CARE  Keep all follow-up visits as told by your doctor. This is important.  Take medicines only as told by your doctor.  If you were prescribed a hormone treatment, take the hormone medicines exactly as told.  Do not take aspirin. It can cause bleeding.  Ask your doctor about taking iron pills and increasing the amount of dark green, leafy vegetables in your diet. These actions can help to boost your blood iron levels.  Pay close attention to your period. Tell your doctor about any changes, such as:  Increased blood flow. This may require you to use more pads or tampons than usual per month.  A change in the number of days that your period lasts per month.  A change in symptoms that come with your period, such as back pain or cramping in your belly area (abdomen). GET HELP IF:  You have pain in your back or the area between your hip bones (pelvic area) that is not controlled by medicines.  You have pain in your abdomen that is not controlled with medicines.  You have an increase in bleeding between and during periods.  You soak tampons or pads in a half hour or less.  You feel lightheaded.  You feel extra tired.  You feel weak. GET HELP RIGHT AWAY IF:   You pass out (faint).  You have a sudden increase in pelvic pain.   This information is not intended to replace advice given to you by your health care provider. Make sure you discuss any questions you have with your health care provider.   Document Released: 04/23/2010 Document Revised: 04/11/2014 Document Reviewed:  09/17/2013 Elsevier Interactive Patient Education [employer].

## 2015-09-21 LAB — GC/CHLAMYDIA PROBE AMP (~~LOC~~) NOT AT ARMC
CHLAMYDIA, DNA PROBE: NEGATIVE
NEISSERIA GONORRHEA: NEGATIVE

## 2015-10-14 ENCOUNTER — Ambulatory Visit: Payer: Medicaid Other | Admitting: Obstetrics

## 2015-10-16 ENCOUNTER — Ambulatory Visit: Payer: Medicaid Other

## 2015-10-16 VITALS — BP 126/85 | HR 90 | Temp 98.5°F | Wt 152.3 lb

## 2015-10-16 DIAGNOSIS — Z309 Encounter for contraceptive management, unspecified: Secondary | ICD-10-CM

## 2015-10-16 MED ORDER — MEDROXYPROGESTERONE ACETATE 150 MG/ML IM SUSP
150.0000 mg | Freq: Once | INTRAMUSCULAR | Status: AC
  Administered 2015-10-16: 150 mg via INTRAMUSCULAR

## 2015-10-27 ENCOUNTER — Encounter: Payer: Self-pay | Admitting: Family Medicine

## 2015-10-27 ENCOUNTER — Ambulatory Visit (INDEPENDENT_AMBULATORY_CARE_PROVIDER_SITE_OTHER): Payer: Medicaid Other | Admitting: Family Medicine

## 2015-10-27 VITALS — BP 132/87 | HR 80 | Temp 98.9°F | Resp 14 | Ht 61.0 in | Wt 152.0 lb

## 2015-10-27 DIAGNOSIS — R1084 Generalized abdominal pain: Secondary | ICD-10-CM

## 2015-10-27 NOTE — Progress Notes (Signed)
Stephanie Mack, is a 25 y.o. female  [id number]  [id number]  DOB - [birth year]  CC:  Chief Complaint  Patient presents with  . Edema    swelling/pain in abdomen x 9 months        HPI: Stephanie Mack is a 25 y.o. female here complaining of abdominal and pelvic pain for 9 months.She has been assessed by GYN and was told she has fibroids. She has also been evaluated in ED She reports her belly hurts every day. She sometimes has nausea. Also admits to some consitpation. She reports the pain is crampy and achy. She has a history of ADHD, Anxiety, Bipolar 1 Disorder, GERD, headaches,  No Known Allergies Past Medical History:  Diagnosis Date  . ADHD (attention deficit hyperactivity disorder)   . ADHD (attention deficit hyperactivity disorder)   . Anemia   . Anxiety   . Bipolar 1 disorder ([place])   . Bipolar disorder ([place])   . Chlamydia 05-31-10  . Depression    depression  . GERD (gastroesophageal reflux disease)    with pregnancy  . Gestational diabetes    current pregnancy  . Gonorrhea contact, treated   . Headache   . Infection    UTI  . Pregnancy induced hypertension    previous pregnancy  . Pseudocyesis 2013   Seen in MAU for percieved FM, abd distension. Normal exam.    Current Outpatient Prescriptions on File Prior to Visit  Medication Sig Dispense Refill  . hydrOXYzine (ATARAX/VISTARIL) 25 MG tablet Take 1 tablet (25 mg total) by mouth every 6 (six) hours as needed for anxiety. 45 tablet 0  . ibuprofen (ADVIL,MOTRIN) 800 MG tablet TAKE 1 TABLET BY MOUTH EVERY 8 HOURS AS NEEDED FOR CRAMPING 270 tablet 5  . OLANZapine (ZYPREXA) 5 MG tablet Take 1 tablet (5 mg total) by mouth at bedtime. For mood control 30 tablet 0  . Prenatal-Fe Asparto Gly-FA (PRENATE STAR) 20-1 MG TABS Take 1 tablet by mouth daily. 30 tablet 11  . sertraline (ZOLOFT) 50 MG tablet Take 1 tablet (50 mg total) by mouth daily. For depression 30 tablet 0  . metroNIDAZOLE (FLAGYL) 500 MG tablet  Take 1 tablet (500 mg total) by mouth 2 (two) times daily. (Patient not taking: Reported on 09/20/2015) 14 tablet 2   No current facility-administered medications on file prior to visit.    Family History  Problem Relation Age of Onset  . Kidney disease Mother   . Hypertension Father   . Cancer Sister   . Asthma Brother   . Diabetes Maternal Grandfather   . Heart disease Neg Hx    Social History   Social History  . Marital status: Single    Spouse name: N/A  . Number of children: N/A  . Years of education: N/A   Occupational History  . Not on file.   Social History Main Topics  . Smoking status: Never Smoker  . Smokeless tobacco: Never Used  . Alcohol use No  . Drug use: No  . Sexual activity: Not Currently    Partners: Male    Birth control/ protection: Injection     Comment: 1 partner   Other Topics Concern  . Not on file   Social History Narrative  . No narrative on file    Review of Systems: Constitutional: Negative for fever, chills, appetite change, weight loss. Positive for Fatigue Skin: Negative for rashes or lesions of concern. HENT: Negative for ear pain, ear discharge.nose bleeds Eyes: Negative for  pain, discharge, redness, itching and visual disturbance. Neck: Negative for pain, stiffness Respiratory: Negative for cough, shortness of breath,   Cardiovascular: Negative for chest pain, palpitations. Positive for some pedal edema with prolonged standing Gastrointestinal: Negative for abdominal pain vomiting, diarrhea. Positive for occasional  Nausea and constipations Genitourinary: Negative for dysuria, urgency,  Hematuria. Positive for frequency Musculoskeletal: Negative for back pain, joint pain, joint  swelling, and gait problem.Negative for weakness. Neurological: Negative for dizziness, tremors, seizures, syncope,   light-headedness, numbness and headaches.  Hematological: Negative for easy bruising or bleeding Psychiatric/Behavioral: Positive  for  depression, anxiety.    Objective:   Vitals:   10/27/15 1349  BP: 132/87  Pulse: 80  Resp: 14  Temp: 98.9 F (37.2 C)    Physical Exam: Constitutional: Patient appears well-developed and well-nourished. No distress. HENT: Normocephalic, atraumatic, External right and left ear normal. Oropharynx is clear and moist.  Eyes: Conjunctivae and EOM are normal. PERRLA, no scleral icterus. Neck: Normal ROM. Neck supple. No lymphadenopathy, No thyromegaly. CVS: RRR, S1/S2 +, no murmurs, no gallops, no rubs Pulmonary: Effort and breath sounds normal, no stridor, rhonchi, wheezes, rales.  Abdominal: The abdomen is obese, she reports a generalized tenderness, BS normal. .  Musculoskeletal: Normal range of motion. No edema and no tenderness.  Neuro: Alert.Normal muscle tone coordination. Non-focal Skin: Skin is warm and dry. No rash noted. Not diaphoretic. No erythema. No pallor. Psychiatric: Normal mood and affect. Behavior, judgment, thought content normal.  Lab Results  Component Value Date   WBC 6.2 08/20/2015   HGB 11.6 (L) 08/20/2015   HCT 35.3 (L) 08/20/2015   MCV 78.3 08/20/2015   PLT 186 08/20/2015   Lab Results  Component Value Date   CREATININE 0.69 06/18/2015   BUN 8 06/18/2015   NA 141 06/18/2015   K 3.4 (L) 06/18/2015   CL 108 06/18/2015   CO2 23 06/18/2015    Lab Results  Component Value Date   HGBA1C 5.9 (H) 06/20/2015   Lipid Panel     Component Value Date/Time   CHOL 192 06/20/2015 1821   TRIG 109 06/20/2015 1821   HDL 49 06/20/2015 1821   CHOLHDL 3.9 06/20/2015 1821   VLDL 22 06/20/2015 1821   LDLCALC 121 (H) 06/20/2015 1821       Assessment and plan:   1. Generalized abdominal pain -Follow-up with GYN -Follow-up with ED if symptoms worsen.   PRN  The patient was given clear instructions to go to ER or return to medical center if symptoms don't improve, worsen or new problems develop. The patient verbalized understanding.    Micheline Nicks FNP  10/27/2015, 2:31 PM

## 2015-10-27 NOTE — Patient Instructions (Signed)
Follow-up with GYN if not improving ED if significantly worse.

## 2015-11-09 ENCOUNTER — Encounter: Payer: Self-pay | Admitting: Obstetrics

## 2015-11-09 ENCOUNTER — Ambulatory Visit (INDEPENDENT_AMBULATORY_CARE_PROVIDER_SITE_OTHER): Payer: Medicaid Other | Admitting: Obstetrics

## 2015-11-09 VITALS — BP 136/88 | HR 76 | Wt 150.4 lb

## 2015-11-09 DIAGNOSIS — R102 Pelvic and perineal pain: Secondary | ICD-10-CM | POA: Diagnosis not present

## 2015-11-09 DIAGNOSIS — K5900 Constipation, unspecified: Secondary | ICD-10-CM | POA: Diagnosis not present

## 2015-11-09 MED ORDER — DOCUSATE SODIUM 100 MG PO CAPS: 100.0000 mg | ORAL_CAPSULE | Freq: Two times a day (BID) | ORAL | 5 refills | Status: DC

## 2015-11-09 MED ORDER — DICYCLOMINE HCL 20 MG PO TABS: 20.0000 mg | ORAL_TABLET | Freq: Three times a day (TID) | ORAL | 2 refills | Status: DC

## 2015-11-09 NOTE — Progress Notes (Signed)
Patient ID: Stephanie Mack, female   DOB: [birth year], 25 y.o.   MRN: [id number] Patient ID: Stephanie Mack, female   DOB: 05-03-1990, 25 y.o.   MRN: [id number]  Chief Complaint  Patient presents with  . Gynecologic Exam    Patient is in the office to discuss her pain. Patient has abdominal enlargement and pain. Patient states she has pain that is debilitating. patient id using the Depo and has started bleeding.     HPI Stephanie Mack is a 25 y.o. female.  C/O constant lower abdominal cramping and bloating.  Also has chronic constipation. HPI  Past Medical History:  Diagnosis Date  . ADHD (attention deficit hyperactivity disorder)   . ADHD (attention deficit hyperactivity disorder)   . Anemia   . Anxiety   . Bipolar 1 disorder ([place])   . Bipolar disorder ([place])   . Chlamydia 05-31-10  . Depression    depression  . GERD (gastroesophageal reflux disease)    with pregnancy  . Gestational diabetes    current pregnancy  . Gonorrhea contact, treated   . Headache   . Infection    UTI  . Pregnancy induced hypertension    previous pregnancy  . Pseudocyesis 2013   Seen in MAU for percieved FM, abd distension. Normal exam.     Past Surgical History:  Procedure Laterality Date  . CESAREAN SECTION N/A 09/01/2012   Procedure:  Primary cesarean section with delivery of baby girl at 82.  Apgars 1/1.  ;  Surgeon: Frederico Hamman, MD;  Location: [facility];  Service: Obstetrics;  Laterality: N/A;  . CESAREAN SECTION N/A 01/07/2015   Procedure: REPEAT CESAREAN SECTION;  Surgeon: Shelly Bombard, MD;  Location: [facility];  Service: Obstetrics;  Laterality: N/A;  . NASAL SEPTUM SURGERY      Family History  Problem Relation Age of Onset  . Kidney disease Mother   . Hypertension Father   . Cancer Sister   . Asthma Brother   . Diabetes Maternal Grandfather   . Heart disease Neg Hx     Social History Social History  Substance Use Topics  . Smoking status: Never Smoker  . Smokeless tobacco:  Never Used  . Alcohol use No    No Known Allergies  Current Outpatient Prescriptions  Medication Sig Dispense Refill  . dicyclomine (BENTYL) 20 MG tablet Take 1 tablet (20 mg total) by mouth 3 (three) times daily before meals. 90 tablet 2  . docusate sodium (COLACE) 100 MG capsule Take 1 capsule (100 mg total) by mouth 2 (two) times daily. 60 capsule 5  . hydrOXYzine (ATARAX/VISTARIL) 25 MG tablet Take 1 tablet (25 mg total) by mouth every 6 (six) hours as needed for anxiety. 45 tablet 0  . ibuprofen (ADVIL,MOTRIN) 800 MG tablet TAKE 1 TABLET BY MOUTH EVERY 8 HOURS AS NEEDED FOR CRAMPING 270 tablet 5  . metroNIDAZOLE (FLAGYL) 500 MG tablet Take 1 tablet (500 mg total) by mouth 2 (two) times daily. (Patient not taking: Reported on 09/20/2015) 14 tablet 2  . OLANZapine (ZYPREXA) 5 MG tablet Take 1 tablet (5 mg total) by mouth at bedtime. For mood control 30 tablet 0  . Prenatal-Fe Asparto Gly-FA (PRENATE STAR) 20-1 MG TABS Take 1 tablet by mouth daily. 30 tablet 11  . sertraline (ZOLOFT) 50 MG tablet Take 1 tablet (50 mg total) by mouth daily. For depression 30 tablet 0   No current facility-administered medications for this visit.     Review of Systems Review of  Systems Constitutional: negative for fatigue and weight loss Respiratory: negative for cough and wheezing Cardiovascular: negative for chest pain, fatigue and palpitations Gastrointestinal: positive for abdominal pain and change in bowel habits Genitourinary:negative Integument/breast: negative for nipple discharge Musculoskeletal:negative for myalgias Neurological: negative for gait problems and tremors Behavioral/Psych: positive for depression Endocrine: negative for temperature intolerance     Blood pressure 136/88, pulse 76, weight 150 lb 6.4 oz (68.2 kg), last menstrual period 11/05/2015, not currently breastfeeding.  Physical Exam Physical Exam           General:  Alert and no distress Abdomen:  normal findings: no  organomegaly, soft, non-tender and no hernia  Pelvis:  External genitalia: normal general appearance Urinary system: urethral meatus normal and bladder without fullness, nontender Vaginal: normal without tenderness, induration or masses Cervix: normal appearance Adnexa: normal bimanual exam Uterus: anteverted and non-tender, normal size      Data Reviewed Labs  Assessment     Pelvic pain Chronic constipation Depression    Plan    Ultrasound ordered R/O IBS.  Referred to GI.  Bentyl Rx.  Colace Rx. Continue antidepressants F/U in 2 weeks  Orders Placed This Encounter  Procedures  . US Transvaginal Non-OB    Standing Status:   Future    Standing Expiration Date:   01/08/2017    Order Specific Question:   Reason for Exam (SYMPTOM  OR DIAGNOSIS REQUIRED)    Answer:   pelvcic pain    Order Specific Question:   Preferred imaging location?    Answer:   Internal  . US Pelvis Complete    Standing Status:   Future    Standing Expiration Date:   01/08/2017    Order Specific Question:   Reason for Exam (SYMPTOM  OR DIAGNOSIS REQUIRED)    Answer:   pelvic pain    Order Specific Question:   Preferred imaging location?    Answer:   Internal  . Ambulatory referral to Gastroenterology    Referral Priority:   Routine    Referral Type:   Consultation    Referral Reason:   Specialty Services Required    Number of Visits Requested:   1   Meds ordered this encounter  Medications  . dicyclomine (BENTYL) 20 MG tablet    Sig: Take 1 tablet (20 mg total) by mouth 3 (three) times daily before meals.    Dispense:  90 tablet    Refill:  2  . docusate sodium (COLACE) 100 MG capsule    Sig: Take 1 capsule (100 mg total) by mouth 2 (two) times daily.    Dispense:  60 capsule    Refill:  5

## 2015-11-10 ENCOUNTER — Encounter: Payer: Self-pay | Admitting: Internal Medicine

## 2015-11-10 NOTE — Addendum Note (Signed)
Addended by: Valli Glance F on: 11/10/2015 10:58 AM   Modules accepted: Orders

## 2015-11-13 ENCOUNTER — Other Ambulatory Visit: Payer: Self-pay | Admitting: Obstetrics

## 2015-11-13 DIAGNOSIS — B9689 Other specified bacterial agents as the cause of diseases classified elsewhere: Secondary | ICD-10-CM

## 2015-11-13 DIAGNOSIS — N760 Acute vaginitis: Principal | ICD-10-CM

## 2015-11-13 LAB — NUSWAB VG+, CANDIDA 6SP
Atopobium vaginae: HIGH Score — AB
BVAB 2: HIGH {score} — AB
CANDIDA KRUSEI, NAA: NEGATIVE
CANDIDA TROPICALIS, NAA: NEGATIVE
CHLAMYDIA TRACHOMATIS, NAA: NEGATIVE
Candida albicans, NAA: NEGATIVE
Candida glabrata, NAA: NEGATIVE
Candida lusitaniae, NAA: NEGATIVE
Candida parapsilosis, NAA: NEGATIVE
Neisseria gonorrhoeae, NAA: NEGATIVE
Trich vag by NAA: NEGATIVE

## 2015-11-13 MED ORDER — METRONIDAZOLE 500 MG PO TABS: 500.0000 mg | ORAL_TABLET | Freq: Two times a day (BID) | ORAL | 2 refills | Status: DC

## 2015-11-16 ENCOUNTER — Ambulatory Visit (HOSPITAL_COMMUNITY)
Admission: RE | Admit: 2015-11-16 | Discharge: 2015-11-16 | Disposition: A | Discharge status: Home / Self Care | Payer: Medicaid Other | Source: Ambulatory Visit | Attending: Obstetrics | Admitting: Obstetrics

## 2015-11-16 ENCOUNTER — Ambulatory Visit (HOSPITAL_COMMUNITY): Payer: Medicaid Other

## 2015-11-16 DIAGNOSIS — R102 Pelvic and perineal pain: Secondary | ICD-10-CM | POA: Insufficient documentation

## 2015-11-18 ENCOUNTER — Encounter (HOSPITAL_COMMUNITY): Payer: Self-pay

## 2015-11-18 ENCOUNTER — Inpatient Hospital Stay (HOSPITAL_COMMUNITY)
Admission: AD | Admit: 2015-11-18 | Discharge: 2015-11-18 | Disposition: A | Discharge status: Home / Self Care | Payer: Medicaid Other | Source: Ambulatory Visit | Attending: Family Medicine | Admitting: Family Medicine

## 2015-11-18 DIAGNOSIS — N760 Acute vaginitis: Secondary | ICD-10-CM | POA: Insufficient documentation

## 2015-11-18 DIAGNOSIS — R1030 Lower abdominal pain, unspecified: Secondary | ICD-10-CM | POA: Diagnosis present

## 2015-11-18 DIAGNOSIS — F319 Bipolar disorder, unspecified: Secondary | ICD-10-CM | POA: Diagnosis not present

## 2015-11-18 DIAGNOSIS — F909 Attention-deficit hyperactivity disorder, unspecified type: Secondary | ICD-10-CM | POA: Diagnosis not present

## 2015-11-18 DIAGNOSIS — K589 Irritable bowel syndrome without diarrhea: Secondary | ICD-10-CM | POA: Diagnosis not present

## 2015-11-18 DIAGNOSIS — Z79899 Other long term (current) drug therapy: Secondary | ICD-10-CM | POA: Diagnosis not present

## 2015-11-18 DIAGNOSIS — K219 Gastro-esophageal reflux disease without esophagitis: Secondary | ICD-10-CM | POA: Insufficient documentation

## 2015-11-18 DIAGNOSIS — F419 Anxiety disorder, unspecified: Secondary | ICD-10-CM | POA: Diagnosis not present

## 2015-11-18 DIAGNOSIS — B9689 Other specified bacterial agents as the cause of diseases classified elsewhere: Secondary | ICD-10-CM

## 2015-11-18 LAB — URINALYSIS, ROUTINE W REFLEX MICROSCOPIC
BILIRUBIN URINE: NEGATIVE
Glucose, UA: NEGATIVE mg/dL
Ketones, ur: NEGATIVE mg/dL
Nitrite: NEGATIVE
Protein, ur: 30 mg/dL — AB
SPECIFIC GRAVITY, URINE: 1.025 (ref 1.005–1.030)
pH: 6 (ref 5.0–8.0)

## 2015-11-18 LAB — URINE MICROSCOPIC-ADD ON

## 2015-11-18 LAB — POCT PREGNANCY, URINE: Preg Test, Ur: NEGATIVE

## 2015-11-18 MED ORDER — ONDANSETRON HCL 4 MG PO TABS: 4.0000 mg | ORAL_TABLET | Freq: Four times a day (QID) | ORAL | 0 refills | Status: DC

## 2015-11-18 MED ORDER — METRONIDAZOLE 0.75 % VA GEL: 1.0000 | Freq: Every day | VAGINAL | 0 refills | Status: DC

## 2015-11-18 MED ORDER — SIMETHICONE 125 MG PO CAPS: 1.0000 | ORAL_CAPSULE | Freq: Four times a day (QID) | ORAL | 0 refills | Status: DC | PRN

## 2015-11-18 MED ORDER — ONDANSETRON 8 MG PO TBDP
8.0000 mg | ORAL_TABLET | Freq: Once | ORAL | Status: AC
  Administered 2015-11-18: 8 mg via ORAL
  Filled 2015-11-18: qty 1

## 2015-11-18 NOTE — Discharge Instructions (Signed)
Bacterial Vaginosis Bacterial vaginosis is an infection of the vagina. It happens when too many germs (bacteria) grow in the vagina. Having this infection puts you at risk for getting other infections from sex. Treating this infection can help lower your risk for other infections, such as:   Chlamydia.  Gonorrhea.  HIV.  Herpes. HOME CARE  Take your medicine as told by your doctor.  Finish your medicine even if you start to feel better.  Tell your sex partner that you have an infection. They should see their doctor for treatment.  During treatment:  Avoid sex or use condoms correctly.  Do not douche.  Do not drink alcohol unless your doctor tells you it is ok.  Do not breastfeed unless your doctor tells you it is ok. GET HELP IF:  You are not getting better after 3 days of treatment.  You have more grey fluid (discharge) coming from your vagina than before.  You have more pain than before.  You have a fever. MAKE SURE YOU:   Understand these instructions.  Will watch your condition.  Will get help right away if you are not doing well or get worse.   This information is not intended to replace advice given to you by your health care provider. Make sure you discuss any questions you have with your health care provider.   Document Released: 12/29/2007 Document Revised: 04/11/2014 Document Reviewed: 10/31/2012 Elsevier Interactive Patient Education 2016 [place].  Diet for Irritable Bowel Syndrome When you have irritable bowel syndrome (IBS), the foods you eat and your eating habits are very important. IBS may cause various symptoms, such as abdominal pain, constipation, or diarrhea. Choosing the right foods can help ease discomfort caused by these symptoms. Work with your health care provider and dietitian to find the best eating plan to help control your symptoms. WHAT GENERAL GUIDELINES DO I NEED TO FOLLOW?  Keep a food diary. This will help you identify foods  that cause symptoms. Write down:  What you eat and when.  What symptoms you have.  When symptoms occur in relation to your meals.  Avoid foods that cause symptoms. Talk with your dietitian about other ways to get the same nutrients that are in these foods.  Eat more foods that contain fiber. Take a fiber supplement if directed by your dietitian.  Eat your meals slowly, in a relaxed setting.  Aim to eat 5-6 small meals per day. Do not skip meals.  Drink enough fluids to keep your urine clear or pale yellow.  Ask your health care provider if you should take an over-the-counter probiotic during flare-ups to help restore healthy gut bacteria.  If you have cramping or diarrhea, try making your meals low in fat and high in carbohydrates. Examples of carbohydrates are pasta, rice, whole grain breads and cereals, fruits, and vegetables.  If dairy products cause your symptoms to flare up, try eating less of them. You might be able to handle yogurt better than other dairy products because it contains bacteria that help with digestion. WHAT FOODS ARE NOT RECOMMENDED? The following are some foods and drinks that may worsen your symptoms:  Fatty foods, such as [place] fries.  Milk products, such as cheese or ice cream.  Chocolate.  Alcohol.  Products with caffeine, such as coffee.  Carbonated drinks, such as soda. The items listed above may not be a complete list of foods and beverages to avoid. Contact your dietitian for more information. WHAT FOODS ARE GOOD SOURCES  OF FIBER? Your health care provider or dietitian may recommend that you eat more foods that contain fiber. Fiber can help reduce constipation and other IBS symptoms. Add foods with fiber to your diet a little at a time so that your body can get used to them. Too much fiber at once might cause gas and swelling of your abdomen. The following are some foods that are good sources of  fiber:  Apples.  Peaches.  Pears.  Berries.  Figs.  Broccoli (raw).  Cabbage.  Carrots.  Raw peas.  Kidney beans.  Lima beans.  Whole grain bread.  Whole grain cereal. FOR MORE INFORMATION  International Foundation for Functional Gastrointestinal Disorders: www.iffgd.[employer] and [employer]estive [employer] Kidney [employer]: NetworkAffair.co.za.aspx   This information is not intended to replace advice given to you by your health care provider. Make sure you discuss any questions you have with your health care provider.   Document Released: 06/11/2003 Document Revised: 04/11/2014 Document Reviewed: 06/21/2013 Elsevier Interactive Patient Education [employer].

## 2015-11-18 NOTE — MAU Provider Note (Signed)
History     CSN: [id number]  Arrival date and time: 11/18/15 1348   First Provider Initiated Contact with Patient 11/18/15 1418      Chief Complaint  Patient presents with  . Abdominal Pain   HPI [name] is a 25 y.o. [phone] who presents to MAU today with complaint of lower abdominal pain x 2 days. However, upon review of the patient's chart she had similar symptoms that prompted a fairly complete work-up in the office last week with Dr. Jodi Mourning. The patient states that she has been taking Ibuprofen for pain with minimal relief. She states pain is moderate. She last took ibuprofen last night, none today. She is still having vaginal bleeding which has been irregular due to Depo Provera. Her last dose of Depo Provera was ~ 1 month ago. She has also had continued N/V/D, all of which were also present at the time of her office visit last week. She was given Bentyl for presumed IBS and referred to GI. She has an appointment with GI on 11/30/15. She states 1 episode of diarrhea today and emesis x 2. She denies fever or sick contacts. She was diagnosed with BV at office visit last week as well, but states that she did not get the call about the diagnosis and Rx.   OB History    Gravida Para Term Preterm AB Living   3 2 0 2 1 1    SAB TAB Ectopic Multiple Live Births   1 0 0 0 2      Past Medical History:  Diagnosis Date  . ADHD (attention deficit hyperactivity disorder)   . ADHD (attention deficit hyperactivity disorder)   . Anemia   . Anxiety   . Bipolar 1 disorder ([place])   . Bipolar disorder ([place])   . Chlamydia 05-31-10  . Depression    depression  . GERD (gastroesophageal reflux disease)    with pregnancy  . Gestational diabetes    current pregnancy  . Gonorrhea contact, treated   . Headache   . Infection    UTI  . Pregnancy induced hypertension    previous pregnancy  . Pseudocyesis 2013   Seen in MAU for percieved FM, abd distension. Normal exam.     Past  Surgical History:  Procedure Laterality Date  . CESAREAN SECTION N/A 09/01/2012   Procedure:  Primary cesarean section with delivery of baby girl at 51.  Apgars 1/1.  ;  Surgeon: Frederico Hamman, MD;  Location: [facility];  Service: Obstetrics;  Laterality: N/A;  . CESAREAN SECTION N/A 01/07/2015   Procedure: REPEAT CESAREAN SECTION;  Surgeon: Shelly Bombard, MD;  Location: [facility];  Service: Obstetrics;  Laterality: N/A;  . NASAL SEPTUM SURGERY    . WISDOM TOOTH EXTRACTION      Family History  Problem Relation Age of Onset  . Kidney disease Mother   . Hypertension Father   . Cancer Sister   . Asthma Brother   . Diabetes Maternal Grandfather   . Heart disease Neg Hx     Social History  Substance Use Topics  . Smoking status: Never Smoker  . Smokeless tobacco: Never Used  . Alcohol use No    Allergies: No Known Allergies  Prescriptions Prior to Admission  Medication Sig Dispense Refill Last Dose  . dicyclomine (BENTYL) 20 MG tablet Take 1 tablet (20 mg total) by mouth 3 (three) times daily before meals. 90 tablet 2 11/17/2015 at Unknown time  . ibuprofen (ADVIL,MOTRIN) 800  MG tablet TAKE 1 TABLET BY MOUTH EVERY 8 HOURS AS NEEDED FOR CRAMPING 270 tablet 5 11/17/2015 at Unknown time  . OLANZapine (ZYPREXA) 5 MG tablet Take 1 tablet (5 mg total) by mouth at bedtime. For mood control 30 tablet 0 11/17/2015 at Unknown time  . Prenatal-Fe Asparto Gly-FA (PRENATE STAR) 20-1 MG TABS Take 1 tablet by mouth daily. 30 tablet 11 11/18/2015 at Unknown time  . sertraline (ZOLOFT) 50 MG tablet Take 1 tablet (50 mg total) by mouth daily. For depression 30 tablet 0 11/18/2015 at Unknown time  . docusate sodium (COLACE) 100 MG capsule Take 1 capsule (100 mg total) by mouth 2 (two) times daily. (Patient not taking: Reported on 11/18/2015) 60 capsule 5 Not Taking at Unknown time  . hydrOXYzine (ATARAX/VISTARIL) 25 MG tablet Take 1 tablet (25 mg total) by mouth every 6 (six) hours as needed for anxiety.  (Patient not taking: Reported on 11/18/2015) 45 tablet 0 Not Taking at Unknown time  . metroNIDAZOLE (FLAGYL) 500 MG tablet Take 1 tablet (500 mg total) by mouth 2 (two) times daily. (Patient not taking: Reported on 11/18/2015) 14 tablet 2 Not Taking at Unknown time    Review of Systems  Constitutional: Negative for fever and malaise/fatigue.  Gastrointestinal: Positive for abdominal pain, diarrhea, nausea and vomiting. Negative for constipation.  Genitourinary: Negative for dysuria, frequency and urgency.       + vaginal bleeding Neg - vaginal discharge   Physical Exam   Blood pressure 129/84, pulse 87, temperature 98.6 F (37 C), temperature source Oral, resp. rate 16, height 5\' 2"  (1.575 m), last menstrual period 11/16/2015, not currently breastfeeding.  Physical Exam  Nursing note and vitals reviewed. Constitutional: She is oriented to person, place, and time. She appears well-developed and well-nourished. No distress.  HENT:  Head: Normocephalic and atraumatic.  Cardiovascular: Normal rate.   Respiratory: Effort normal.  GI: Soft. She exhibits distension (mild). She exhibits no mass. Bowel sounds are increased. There is tenderness (very mild diffuse tenderness). There is no rebound and no guarding.  Neurological: She is alert and oriented to person, place, and time.  Skin: Skin is warm and dry. No erythema.  Psychiatric: She has a normal mood and affect.    Results for orders placed or performed during the hospital encounter of 11/18/15 (from the past 24 hour(s))  Urinalysis, Routine w reflex microscopic (not at [facility])     Status: Abnormal   Collection Time: 11/18/15  1:53 PM  Result Value Ref Range   Color, Urine YELLOW YELLOW   APPearance HAZY (A) CLEAR   Specific Gravity, Urine 1.025 1.005 - 1.030   pH 6.0 5.0 - 8.0   Glucose, UA NEGATIVE NEGATIVE mg/dL   Hgb urine dipstick LARGE (A) NEGATIVE   Bilirubin Urine NEGATIVE NEGATIVE   Ketones, ur NEGATIVE NEGATIVE mg/dL    Protein, ur 30 (A) NEGATIVE mg/dL   Nitrite NEGATIVE NEGATIVE   Leukocytes, UA TRACE (A) NEGATIVE  Urine microscopic-add on     Status: Abnormal   Collection Time: 11/18/15  1:53 PM  Result Value Ref Range   Squamous Epithelial / LPF 6-30 (A) NONE SEEN   WBC, UA 6-30 0 - 5 WBC/hpf   RBC / HPF TOO NUMEROUS TO COUNT 0 - 5 RBC/hpf   Bacteria, UA MANY (A) NONE SEEN  Pregnancy, urine POC     Status: None   Collection Time: 11/18/15  2:09 PM  Result Value Ref Range   Preg Test, Ur NEGATIVE NEGATIVE  MAU Course  Procedures None  MDM Reviewed records from recent visit with Dr. Jodi Mourning - normal [place], treated for BV and IBS, advised of need for referral to GI as likely cause of symptoms  UPT - negative today  UA today  Urine culture ordered Patient given 8 mg ODT Zofran in MAU for nausea. No vomiting or diarrhea while in MAU.  Will change original Rx for Flagyl to Metrogel to avoid worsening of GI symptoms while treating for known BV Assessment and Plan  A: IBS Bacterial vaginosis  P: Discharge home Rx for Metrogel, Simethicone and Zofran given to patient Warning signs for worsening condition discussed Patient advised to follow-up with GI as scheduled later this month for further evaluation Patient may return to MAU as needed or if her condition were to change or worsen, however since most symptoms are unlikely GYN in origin, she has been advised to go to [facility] or [facility] for worsening symptoms.    Luvenia Redden, PA-C  11/18/2015, 3:05 PM

## 2015-11-18 NOTE — MAU Note (Signed)
Ems arrival.  For a couple of days have been having pain in the bottoms of her stomach. Sharp pains, comes and goes- worse than labor. Having frequency and urgency- no pain with urination. Diarrhea all day yesterday, none today.  Is bleeding, 2nd time this month, ? irreg bleeding/periods due to depo, last was July 12

## 2015-11-19 LAB — URINE CULTURE

## 2015-11-26 ENCOUNTER — Encounter: Payer: Self-pay | Admitting: Obstetrics

## 2015-11-26 ENCOUNTER — Ambulatory Visit (INDEPENDENT_AMBULATORY_CARE_PROVIDER_SITE_OTHER): Payer: Medicaid Other | Admitting: Obstetrics

## 2015-11-26 VITALS — BP 147/85 | HR 82 | Wt 149.0 lb

## 2015-11-26 DIAGNOSIS — K589 Irritable bowel syndrome without diarrhea: Secondary | ICD-10-CM | POA: Diagnosis not present

## 2015-11-26 DIAGNOSIS — R87612 Low grade squamous intraepithelial lesion on cytologic smear of cervix (LGSIL): Secondary | ICD-10-CM

## 2015-11-26 DIAGNOSIS — R102 Pelvic and perineal pain: Secondary | ICD-10-CM

## 2015-11-26 NOTE — Progress Notes (Signed)
Patient ID: Stephanie Mack, female   DOB: Jun 05, 1990, 25 y.o.   MRN: [id number]  Chief Complaint  Patient presents with  . Follow-up    u/s results    HPI Stephanie Mack is a 25 y.o. female.  History of abdominal-pelvic pain, diarrhea.  Recently evaluated in ER for abdominal pain and diarrhea.  Negative ultrasound. Her w/u is consistent with IBS. HPI  Past Medical History:  Diagnosis Date  . ADHD (attention deficit hyperactivity disorder)   . ADHD (attention deficit hyperactivity disorder)   . Anemia   . Anxiety   . Bipolar 1 disorder ([place])   . Bipolar disorder ([place])   . Chlamydia 05-31-10  . Depression    depression  . GERD (gastroesophageal reflux disease)    with pregnancy  . Gestational diabetes    current pregnancy  . Gonorrhea contact, treated   . Headache   . Infection    UTI  . Pregnancy induced hypertension    previous pregnancy  . Pseudocyesis 2013   Seen in MAU for percieved FM, abd distension. Normal exam.     Past Surgical History:  Procedure Laterality Date  . CESAREAN SECTION N/A 09/01/2012   Procedure:  Primary cesarean section with delivery of baby girl at 47.  Apgars 1/1.  ;  Surgeon: Frederico Hamman, MD;  Location: [facility];  Service: Obstetrics;  Laterality: N/A;  . CESAREAN SECTION N/A 01/07/2015   Procedure: REPEAT CESAREAN SECTION;  Surgeon: Shelly Bombard, MD;  Location: [facility];  Service: Obstetrics;  Laterality: N/A;  . NASAL SEPTUM SURGERY    . WISDOM TOOTH EXTRACTION      Family History  Problem Relation Age of Onset  . Kidney disease Mother   . Hypertension Father   . Cancer Sister   . Asthma Brother   . Diabetes Maternal Grandfather   . Heart disease Neg Hx     Social History Social History  Substance Use Topics  . Smoking status: Never Smoker  . Smokeless tobacco: Never Used  . Alcohol use No    No Known Allergies  Current Outpatient Prescriptions  Medication Sig Dispense Refill  . dicyclomine (BENTYL) 20 MG tablet  Take 1 tablet (20 mg total) by mouth 3 (three) times daily before meals. 90 tablet 2  . ibuprofen (ADVIL,MOTRIN) 800 MG tablet TAKE 1 TABLET BY MOUTH EVERY 8 HOURS AS NEEDED FOR CRAMPING 270 tablet 5  . OLANZapine (ZYPREXA) 5 MG tablet Take 1 tablet (5 mg total) by mouth at bedtime. For mood control 30 tablet 0  . ondansetron (ZOFRAN) 4 MG tablet Take 1 tablet (4 mg total) by mouth every 6 (six) hours. 12 tablet 0  . Prenatal-Fe Asparto Gly-FA (PRENATE STAR) 20-1 MG TABS Take 1 tablet by mouth daily. 30 tablet 11  . sertraline (ZOLOFT) 50 MG tablet Take 1 tablet (50 mg total) by mouth daily. For depression 30 tablet 0  . Simethicone 125 MG CAPS Take 1 capsule (125 mg total) by mouth 4 (four) times daily as needed. 28 each 0   No current facility-administered medications for this visit.     Review of Systems Review of Systems Constitutional: negative for fatigue and weight loss Respiratory: negative for cough and wheezing Cardiovascular: negative for chest pain, fatigue and palpitations Gastrointestinal: positive for abdominal pain and change in bowel habits Genitourinary:positive for pelvic pain Integument/breast: negative for nipple discharge Musculoskeletal:negative for myalgias Neurological: negative for gait problems and tremors Behavioral/Psych: negative for abusive relationship, depression Endocrine: negative for  temperature intolerance     Blood pressure (!) 147/85, pulse 82, last menstrual period 11/16/2015, not currently breastfeeding.  Physical Exam Physical Exam:  Deferred 100% of 10 min visit spent on counseling and coordination of care.   Data Reviewed Labs Ultrasound  Assessment     Probable IBS    Plan    Referred to GI  F/U prn  No orders of the defined types were placed in this encounter.  No orders of the defined types were placed in this encounter.

## 2015-11-30 ENCOUNTER — Ambulatory Visit (INDEPENDENT_AMBULATORY_CARE_PROVIDER_SITE_OTHER): Payer: Medicaid Other | Admitting: Internal Medicine

## 2015-11-30 ENCOUNTER — Encounter: Payer: Self-pay | Admitting: Internal Medicine

## 2015-11-30 VITALS — BP 110/76 | HR 72 | Ht 61.0 in | Wt 147.6 lb

## 2015-11-30 DIAGNOSIS — K5900 Constipation, unspecified: Secondary | ICD-10-CM | POA: Diagnosis not present

## 2015-11-30 DIAGNOSIS — K5909 Other constipation: Secondary | ICD-10-CM

## 2015-11-30 NOTE — Patient Instructions (Signed)
  Dr Carlean Purl recommends that you complete a bowel purge (to clean out your bowels). Please do the following: Purchase a bottle of Miralax over the counter as well as a box of 5 mg dulcolax tablets. Take 4 dulcolax tablets. Wait 1 hour. You will then drink 6-8 capfuls of Miralax mixed in an adequate amount of water/juice/gatorade (you may choose which of these liquids to drink) over the next 2-3 hours. You should expect results within 1 to 6 hours after completing the bowel purge.     After doing the purge take a dose of Miralax daily.   You can use dulcolax every 2 days as needed.    Today we are giving you a high fiber diet handout to read and follow.   Follow up with Dr Carlean Purl as needed.      I appreciate the opportunity to care for you. Silvano Rusk, MD, [facility]

## 2015-11-30 NOTE — Progress Notes (Signed)
Referred by: Shelly Bombard, MD [address] [place], [place] [address]  Subjective:    Patient ID: Stephanie Mack, female    DOB: 1990-10-18, 25 y.o.   MRN: [id number] Cc: constipation HPI African-American woman here with mom, because of chronic constipation that's been occurring since she delivered a child last year. She goes for days maybe weeks without a good bowel movement, gets bloated distended and feels poorly. She has tried MiraLAX stool softeners without relief, it sounds like the MiraLAX taken daily but am not sure for how long. She's never had any type of a purge. There is no bleeding. She does get bloated and gassy at times. She has not used stimulant laxatives. ER review of systems otherwise negative. No Known Allergies Outpatient Medications Prior to Visit  Medication Sig Dispense Refill  . dicyclomine (BENTYL) 20 MG tablet Take 1 tablet (20 mg total) by mouth 3 (three) times daily before meals. 90 tablet 2  . hydrOXYzine (VISTARIL) 25 MG capsule TK ONE C PO  QD  0  . ibuprofen (ADVIL,MOTRIN) 800 MG tablet TAKE 1 TABLET BY MOUTH EVERY 8 HOURS AS NEEDED FOR CRAMPING 270 tablet 5  . loratadine (CLARITIN) 10 MG tablet Take 10 mg by mouth daily.  11  . MedroxyPROGESTERone Acetate 150 MG/ML SUSY INJECT ONE ML IN THE MUSCLE EVERY THREE MONTHS  0  . metroNIDAZOLE (METROGEL) 0.75 % vaginal gel I 1 APL VAGINALLY HS  0  . OLANZapine (ZYPREXA) 5 MG tablet Take 1 tablet (5 mg total) by mouth at bedtime. For mood control 30 tablet 0  . ondansetron (ZOFRAN) 4 MG tablet Take 1 tablet (4 mg total) by mouth every 6 (six) hours. 12 tablet 0  . Prenatal-Fe Asparto Gly-FA (PRENATE STAR) 20-1 MG TABS Take 1 tablet by mouth daily. 30 tablet 11  . sertraline (ZOLOFT) 50 MG tablet Take 1 tablet (50 mg total) by mouth daily. For depression 30 tablet 0  . Simethicone 125 MG CAPS Take 1 capsule (125 mg total) by mouth 4 (four) times daily as needed. 28 each 0   No facility-administered  medications prior to visit.    Past Medical History:  Diagnosis Date  . ADHD (attention deficit hyperactivity disorder)   . ADHD (attention deficit hyperactivity disorder)   . Anemia   . Anxiety   . Bipolar 1 disorder ([place])   . Bipolar disorder ([place])   . Chlamydia 05-31-10  . Depression    depression  . GERD (gastroesophageal reflux disease)    with pregnancy  . Gestational diabetes    current pregnancy  . Gonorrhea contact, treated   . Headache   . Infection    UTI  . Pregnancy induced hypertension    previous pregnancy  . Pseudocyesis 2013   Seen in MAU for percieved FM, abd distension. Normal exam.    Past Surgical History:  Procedure Laterality Date  . CESAREAN SECTION N/A 09/01/2012   Procedure:  Primary cesarean section with delivery of baby girl at 63.  Apgars 1/1.  ;  Surgeon: Frederico Hamman, MD;  Location: [facility];  Service: Obstetrics;  Laterality: N/A;  . CESAREAN SECTION N/A 01/07/2015   Procedure: REPEAT CESAREAN SECTION;  Surgeon: Shelly Bombard, MD;  Location: [facility];  Service: Obstetrics;  Laterality: N/A;  . NASAL SEPTUM SURGERY    . WISDOM TOOTH EXTRACTION     Social History   Social History  . Marital status: Single    Spouse name: N/A  .  Number of children: N/A  . Years of education: N/A   Social History Main Topics  . Smoking status: Never Smoker  . Smokeless tobacco: Never Used  . Alcohol use No  . Drug use: No  . Sexual activity: Not Currently    Partners: Male    Birth control/ protection: Injection     Comment: 1 partner   Other Topics Concern  . None   Social History Narrative  . None   Family History  Problem Relation Age of Onset  . Kidney disease Mother   . Hypertension Father   . Cancer Sister   . Asthma Brother   . Diabetes Maternal Grandfather   . Heart disease Neg Hx         Review of Systems Bipolar disorder with anxiety and depression on treatment urinary frequency and pain recently treated for bacterial  vaginosis all other review of systems negative or aspirate history of present illness    Objective:   Physical Exam @BP  110/76 (BP Location: Left Arm, Patient Position: Sitting, Cuff Size: Normal)   Pulse 72   Ht 5\' 1"  (1.549 m)   Wt 147 lb 9.6 oz (67 kg)   LMP 11/16/2015 (Approximate)   BMI 27.89 kg/m @  General:  Well-developed, well-nourished and in no acute distress Eyes:  anicteric. ENT:   Mouth and posterior pharynx free of lesions.  Neck:   supple w/o thyromegaly or mass.  Lungs: Clear to auscultation bilaterally. Heart:  S1S2, no rubs, murmurs, gallops. Abdomen:  soft, non-tender, no hepatosplenomegaly, hernia, or mass and BS+.  Rectal:  Patti [place], [place] present.  Rectal exam reveals a normal anoderm, and intact anal wink. There is normal resting tone and normal squeeze, no mass or significant rectocele. Normal voluntary squeeze and normal descent and appropriate abdominal contraction with simulated defecation.  Lymph:  no cervical or supraclavicular adenopathy. Extremities:   no edema, cyanosis or clubbing Skin   no rash. Neuro:  A&O x 3.  Psych:  appropriate mood and  Affect.   Data Reviewed: [facility] ED visit recently, had bacterial vaginosis. She had a recent pelvic ultrasound going a heterogeneous myometrium. No focal lesions or clear-cut fibroids. TSH normal last year.      Assessment & Plan:   1. Chronic constipation     I'm going to treat her with a MiraLAX purge which is Bissett Cottle 20 mg followed by 6 doses of MiraLAX. She will then take MiraLAX daily 1 dose, and take bisacodyl every 2 days or so as needed to promote better bowel movements. If this fails I can see her back. High-fiber diet is also recommended. On rectal exam she does not seem have  evidence of anorectal or pelvic floor dysfunction. If these simple measures fail to work, possible Sitzmarks study, anorectal manometry.  I appreciate the opportunity to care for this patient. CC:  Baltazar Najjar M.D. Dorena Dew, FNP

## 2015-12-16 ENCOUNTER — Encounter: Payer: Self-pay | Admitting: Obstetrics

## 2015-12-16 ENCOUNTER — Ambulatory Visit (INDEPENDENT_AMBULATORY_CARE_PROVIDER_SITE_OTHER): Payer: Medicaid Other | Admitting: Obstetrics

## 2015-12-16 VITALS — BP 144/94 | HR 101 | Temp 98.1°F | Wt 147.0 lb

## 2015-12-16 DIAGNOSIS — R102 Pelvic and perineal pain: Secondary | ICD-10-CM | POA: Diagnosis not present

## 2015-12-16 DIAGNOSIS — K589 Irritable bowel syndrome without diarrhea: Secondary | ICD-10-CM

## 2015-12-16 NOTE — Progress Notes (Signed)
Patient ID: Stephanie Mack, female   DOB: 07-10-1990, 25 y.o.   MRN: [id number]  Chief Complaint  Patient presents with  . Gynecologic Exam    3 month follow up    HPI Stephanie Mack is a 25 y.o. female.  History of lower abdominal pain, bloating and chronic constipation.  Referred to GI and diagnosed with IBS and prescribed a medication.  Symptoms still persist                                               HPI  Past Medical History:  Diagnosis Date  . ADHD (attention deficit hyperactivity disorder)   . ADHD (attention deficit hyperactivity disorder)   . Anemia   . Anxiety   . Bipolar 1 disorder ([place])   . Bipolar disorder ([place])   . Chlamydia 05-31-10  . Depression    depression  . GERD (gastroesophageal reflux disease)    with pregnancy  . Gestational diabetes    current pregnancy  . Gonorrhea contact, treated   . Headache   . Infection    UTI  . Pregnancy induced hypertension    previous pregnancy  . Pseudocyesis 2013   Seen in MAU for percieved FM, abd distension. Normal exam.     Past Surgical History:  Procedure Laterality Date  . CESAREAN SECTION N/A 09/01/2012   Procedure:  Primary cesarean section with delivery of baby girl at 76.  Apgars 1/1.  ;  Surgeon: Frederico Hamman, MD;  Location: [facility];  Service: Obstetrics;  Laterality: N/A;  . CESAREAN SECTION N/A 01/07/2015   Procedure: REPEAT CESAREAN SECTION;  Surgeon: Shelly Bombard, MD;  Location: [facility];  Service: Obstetrics;  Laterality: N/A;  . NASAL SEPTUM SURGERY    . WISDOM TOOTH EXTRACTION      Family History  Problem Relation Age of Onset  . Kidney disease Mother   . Hypertension Father   . Cancer Sister   . Asthma Brother   . Diabetes Maternal Grandfather   . Heart disease Neg Hx     Social History Social History  Substance Use Topics  . Smoking status: Never Smoker  . Smokeless tobacco: Never Used  . Alcohol use No    No Known Allergies  Current Outpatient Prescriptions   Medication Sig Dispense Refill  . ibuprofen (ADVIL,MOTRIN) 800 MG tablet TAKE 1 TABLET BY MOUTH EVERY 8 HOURS AS NEEDED FOR CRAMPING 270 tablet 5  . MedroxyPROGESTERone Acetate 150 MG/ML SUSY INJECT ONE ML IN THE MUSCLE EVERY THREE MONTHS  0  . OLANZapine (ZYPREXA) 5 MG tablet Take 1 tablet (5 mg total) by mouth at bedtime. For mood control 30 tablet 0  . ondansetron (ZOFRAN) 4 MG tablet Take 1 tablet (4 mg total) by mouth every 6 (six) hours. 12 tablet 0  . Prenatal-Fe Asparto Gly-FA (PRENATE STAR) 20-1 MG TABS Take 1 tablet by mouth daily. 30 tablet 11  . sertraline (ZOLOFT) 50 MG tablet Take 1 tablet (50 mg total) by mouth daily. For depression 30 tablet 0  . Simethicone 125 MG CAPS Take 1 capsule (125 mg total) by mouth 4 (four) times daily as needed. 28 each 0  . dicyclomine (BENTYL) 20 MG tablet Take 1 tablet (20 mg total) by mouth 3 (three) times daily before meals. (Patient not taking: Reported on 12/16/2015) 90 tablet 2  . hydrOXYzine (VISTARIL)  25 MG capsule TK ONE C PO  QD  0  . loratadine (CLARITIN) 10 MG tablet Take 10 mg by mouth daily.  11   No current facility-administered medications for this visit.     Review of Systems Review of Systems Constitutional: negative for fatigue and weight loss Respiratory: negative for cough and wheezing Cardiovascular: negative for chest pain, fatigue and palpitations Gastrointestinal: positive for abdominal pain and change in bowel habits Genitourinary:negative Integument/breast: negative for nipple discharge Musculoskeletal:negative for myalgias Neurological: negative for gait problems and tremors Behavioral/Psych: negative for abusive relationship, depression Endocrine: negative for temperature intolerance     Blood pressure (!) 144/94, pulse (!) 101, temperature 98.1 F (36.7 C), weight 147 lb (66.7 kg), last menstrual period 11/16/2015, not currently breastfeeding.  Physical Exam Physical Exam General:   alert  Skin:   no rash  or abnormalities  Lungs:   clear to auscultation bilaterally  Heart:   regular rate and rhythm, S1, S2 normal, no murmur, click, rub or gallop  Breasts:   normal without suspicious masses, skin or nipple changes or axillary nodes  Abdomen:  normal findings: no organomegaly, soft, non-tender and no hernia  Pelvis:  External genitalia: normal general appearance Urinary system: urethral meatus normal and bladder without fullness, nontender Vaginal: normal without tenderness, induration or masses Cervix: normal appearance Adnexa: normal bimanual exam Uterus: anteverted and non-tender, normal size    50% of 20 min visit spent on counseling and coordination of care.   Data Reviewed Labs Office notes from GI consultant Ultrasound  Assessment     Lower abdominal pain and bloating.  Clinically IBS.  No significant improvement with meds, but poor compliance by patient.    Plan    Continue meds prescribed by GI. F/U in 6 weeks No orders of the defined types were placed in this encounter.  No orders of the defined types were placed in this encounter.        Patient ID: Stephanie Mack, female   DOB: 06-30-1990, 25 y.o.   MRN: [id number] Patient ID: Stephanie Mack, female   DOB: 05/24/1990, 25 y.o.   MRN: [id number]

## 2015-12-17 ENCOUNTER — Encounter: Payer: Self-pay | Admitting: Obstetrics

## 2015-12-20 LAB — NUSWAB VG+, CANDIDA 6SP
CANDIDA GLABRATA, NAA: NEGATIVE
Candida albicans, NAA: POSITIVE — AB
Candida krusei, NAA: NEGATIVE
Candida lusitaniae, NAA: NEGATIVE
Candida parapsilosis, NAA: NEGATIVE
Candida tropicalis, NAA: NEGATIVE
Chlamydia trachomatis, NAA: NEGATIVE
NEISSERIA GONORRHOEAE, NAA: NEGATIVE
Trich vag by NAA: NEGATIVE

## 2015-12-21 ENCOUNTER — Other Ambulatory Visit: Payer: Self-pay | Admitting: Obstetrics

## 2015-12-21 DIAGNOSIS — B373 Candidiasis of vulva and vagina: Secondary | ICD-10-CM

## 2015-12-21 DIAGNOSIS — B9689 Other specified bacterial agents as the cause of diseases classified elsewhere: Secondary | ICD-10-CM

## 2015-12-21 DIAGNOSIS — N760 Acute vaginitis: Principal | ICD-10-CM

## 2015-12-21 DIAGNOSIS — B3731 Acute candidiasis of vulva and vagina: Secondary | ICD-10-CM

## 2015-12-21 MED ORDER — FLUCONAZOLE 150 MG PO TABS: 150.0000 mg | ORAL_TABLET | Freq: Once | ORAL | 2 refills | Status: AC

## 2015-12-21 MED ORDER — METRONIDAZOLE 500 MG PO TABS: 500.0000 mg | ORAL_TABLET | Freq: Two times a day (BID) | ORAL | 2 refills | Status: DC

## 2015-12-23 ENCOUNTER — Telehealth: Payer: Self-pay | Admitting: *Deleted

## 2015-12-23 NOTE — Telephone Encounter (Signed)
Patient made aware of lab results and medication sent to her pharmacy.Cautioned not to drink alcohol with this medication it can make her vomit.

## 2016-01-04 ENCOUNTER — Ambulatory Visit: Payer: Self-pay | Admitting: Family Medicine

## 2016-01-07 ENCOUNTER — Ambulatory Visit: Payer: Medicaid Other

## 2016-01-10 ENCOUNTER — Encounter (HOSPITAL_COMMUNITY): Payer: Self-pay | Admitting: *Deleted

## 2016-01-10 ENCOUNTER — Inpatient Hospital Stay (HOSPITAL_COMMUNITY)
Admission: AD | Admit: 2016-01-10 | Discharge: 2016-01-10 | Disposition: A | Discharge status: Home / Self Care | Payer: Medicaid Other | Source: Ambulatory Visit | Attending: Obstetrics & Gynecology | Admitting: Obstetrics & Gynecology

## 2016-01-10 DIAGNOSIS — K210 Gastro-esophageal reflux disease with esophagitis: Secondary | ICD-10-CM | POA: Diagnosis not present

## 2016-01-10 DIAGNOSIS — G44201 Tension-type headache, unspecified, intractable: Secondary | ICD-10-CM | POA: Insufficient documentation

## 2016-01-10 DIAGNOSIS — G44209 Tension-type headache, unspecified, not intractable: Secondary | ICD-10-CM

## 2016-01-10 DIAGNOSIS — K219 Gastro-esophageal reflux disease without esophagitis: Secondary | ICD-10-CM

## 2016-01-10 DIAGNOSIS — R109 Unspecified abdominal pain: Secondary | ICD-10-CM | POA: Diagnosis present

## 2016-01-10 LAB — URINE MICROSCOPIC-ADD ON

## 2016-01-10 LAB — COMPREHENSIVE METABOLIC PANEL
ALBUMIN: 4 g/dL (ref 3.5–5.0)
ALK PHOS: 64 U/L (ref 38–126)
ALT: 34 U/L (ref 14–54)
AST: 26 U/L (ref 15–41)
Anion gap: 5 (ref 5–15)
BUN: 10 mg/dL (ref 6–20)
CALCIUM: 9.6 mg/dL (ref 8.9–10.3)
CHLORIDE: 109 mmol/L (ref 101–111)
CO2: 24 mmol/L (ref 22–32)
CREATININE: 0.61 mg/dL (ref 0.44–1.00)
GFR calc Af Amer: >60 mL/min (ref >60)
Glucose, Bld: 155 mg/dL — ABNORMAL HIGH (ref 65–99)
Potassium: 3.8 mmol/L (ref 3.5–5.1)
SODIUM: 138 mmol/L (ref 135–145)
Total Bilirubin: 0.8 mg/dL (ref 0.3–1.2)
Total Protein: 7.1 g/dL (ref 6.5–8.1)

## 2016-01-10 LAB — AMYLASE: AMYLASE: 47 U/L (ref 28–100)

## 2016-01-10 LAB — URINALYSIS, ROUTINE W REFLEX MICROSCOPIC
BILIRUBIN URINE: NEGATIVE
GLUCOSE, UA: 250 mg/dL — AB
HGB URINE DIPSTICK: NEGATIVE
KETONES UR: 15 mg/dL — AB
LEUKOCYTES UA: NEGATIVE
Nitrite: NEGATIVE
PH: 5.5 (ref 5.0–8.0)
Protein, ur: 30 mg/dL — AB
Specific Gravity, Urine: >1.03 — ABNORMAL HIGH (ref 1.005–1.030)

## 2016-01-10 LAB — CBC
HCT: 38.2 % (ref 36.0–46.0)
HEMOGLOBIN: 12.7 g/dL (ref 12.0–15.0)
MCH: 25.8 pg — ABNORMAL LOW (ref 26.0–34.0)
MCHC: 33.2 g/dL (ref 30.0–36.0)
MCV: 77.6 fL — ABNORMAL LOW (ref 78.0–100.0)
Platelets: 189 10*3/uL (ref 150–400)
RBC: 4.92 MIL/uL (ref 3.87–5.11)
RDW: 13.8 % (ref 11.5–15.5)
WBC: 5.1 10*3/uL (ref 4.0–10.5)

## 2016-01-10 LAB — POCT PREGNANCY, URINE: Preg Test, Ur: NEGATIVE

## 2016-01-10 LAB — LIPASE, BLOOD: Lipase: 34 U/L (ref 11–51)

## 2016-01-10 MED ORDER — BUTALBITAL-APAP-CAFFEINE 50-325-40 MG PO TABS
2.0000 | ORAL_TABLET | Freq: Once | ORAL | Status: AC
Start: 2016-01-10 — End: 2016-01-10
  Administered 2016-01-10: 2 via ORAL
  Filled 2016-01-10: qty 2

## 2016-01-10 MED ORDER — GI COCKTAIL ~~LOC~~
30.0000 mL | Freq: Once | ORAL | Status: AC
  Administered 2016-01-10: 30 mL via ORAL
  Filled 2016-01-10: qty 30

## 2016-01-10 MED ORDER — BUTALBITAL-APAP-CAFFEINE 50-325-40 MG PO TABS: 1.0000 | ORAL_TABLET | Freq: Four times a day (QID) | ORAL | 0 refills | Status: DC | PRN

## 2016-01-10 MED ORDER — FAMOTIDINE 40 MG PO TABS: 40.0000 mg | ORAL_TABLET | Freq: Every day | ORAL | 0 refills | Status: DC

## 2016-01-10 NOTE — MAU Provider Note (Signed)
History     CSN: [id number]  Arrival date and time: 01/10/16 [id number]   First Provider Initiated Contact with Patient 01/10/16 2032      Chief Complaint  Patient presents with  . Abdominal Pain  . Headache   Abdominal Pain  This is a new problem. The current episode started today. The onset quality is gradual. The problem occurs constantly. The problem has been unchanged. The pain is located in the epigastric region. The pain is at a severity of 8/10. The quality of the pain is burning. The abdominal pain does not radiate. Associated symptoms include constipation (last BM today ) and headaches. Pertinent negatives include no diarrhea, dysuria, fever, frequency, nausea or vomiting. Nothing aggravates the pain. The pain is relieved by nothing. She has tried nothing for the symptoms.  Headache   This is a new problem. The current episode started today. The problem occurs constantly. The problem has been unchanged. The pain is located in the frontal region. The pain quality is similar to prior headaches. The pain is at a severity of 7/10. Associated symptoms include abdominal pain. Pertinent negatives include no fever, nausea or vomiting. Exacerbated by: when I lay on my head. She has tried nothing for the symptoms.    Past Medical History:  Diagnosis Date  . ADHD (attention deficit hyperactivity disorder)   . ADHD (attention deficit hyperactivity disorder)   . Anemia   . Anxiety   . Bipolar 1 disorder ([place])   . Bipolar disorder ([place])   . Chlamydia 05-31-10  . Depression    depression  . GERD (gastroesophageal reflux disease)    with pregnancy  . Gestational diabetes    current pregnancy  . Gonorrhea contact, treated   . Headache   . Infection    UTI  . Pregnancy induced hypertension    previous pregnancy  . Pseudocyesis 2013   Seen in MAU for percieved FM, abd distension. Normal exam.     Past Surgical History:  Procedure Laterality Date  . CESAREAN SECTION N/A 09/01/2012    Procedure:  Primary cesarean section with delivery of baby girl at 17.  Apgars 1/1.  ;  Surgeon: Frederico Hamman, MD;  Location: [facility];  Service: Obstetrics;  Laterality: N/A;  . CESAREAN SECTION N/A 01/07/2015   Procedure: REPEAT CESAREAN SECTION;  Surgeon: Shelly Bombard, MD;  Location: [facility];  Service: Obstetrics;  Laterality: N/A;  . NASAL SEPTUM SURGERY    . WISDOM TOOTH EXTRACTION      Family History  Problem Relation Age of Onset  . Kidney disease Mother   . Hypertension Father   . Cancer Sister   . Asthma Brother   . Diabetes Maternal Grandfather   . Heart disease Neg Hx     Social History  Substance Use Topics  . Smoking status: Never Smoker  . Smokeless tobacco: Never Used  . Alcohol use No    Allergies: No Known Allergies  Prescriptions Prior to Admission  Medication Sig Dispense Refill Last Dose  . dicyclomine (BENTYL) 20 MG tablet Take 1 tablet (20 mg total) by mouth 3 (three) times daily before meals. (Patient not taking: Reported on 12/16/2015) 90 tablet 2 Not Taking  . hydrOXYzine (VISTARIL) 25 MG capsule TK ONE C PO  QD  0 Not Taking  . ibuprofen (ADVIL,MOTRIN) 800 MG tablet TAKE 1 TABLET BY MOUTH EVERY 8 HOURS AS NEEDED FOR CRAMPING 270 tablet 5 Taking  . loratadine (CLARITIN) 10 MG tablet Take 10 mg  by mouth daily.  11 Not Taking  . MedroxyPROGESTERone Acetate 150 MG/ML SUSY INJECT ONE ML IN THE MUSCLE EVERY THREE MONTHS  0 Taking  . metroNIDAZOLE (FLAGYL) 500 MG tablet Take 1 tablet (500 mg total) by mouth 2 (two) times daily. 14 tablet 2   . OLANZapine (ZYPREXA) 5 MG tablet Take 1 tablet (5 mg total) by mouth at bedtime. For mood control 30 tablet 0 Taking  . ondansetron (ZOFRAN) 4 MG tablet Take 1 tablet (4 mg total) by mouth every 6 (six) hours. 12 tablet 0 Taking  . Prenatal-Fe Asparto Gly-FA (PRENATE STAR) 20-1 MG TABS Take 1 tablet by mouth daily. 30 tablet 11 Taking  . sertraline (ZOLOFT) 50 MG tablet Take 1 tablet (50 mg total) by mouth daily.  For depression 30 tablet 0 Taking  . Simethicone 125 MG CAPS Take 1 capsule (125 mg total) by mouth 4 (four) times daily as needed. 28 each 0 Taking    Review of Systems  Constitutional: Negative for chills and fever.  Gastrointestinal: Positive for abdominal pain and constipation (last BM today ). Negative for diarrhea, nausea and vomiting.  Genitourinary: Negative for dysuria, frequency and urgency.  Neurological: Positive for headaches.   Physical Exam   Blood pressure 128/68, pulse 92, temperature 98 F (36.7 C), temperature source Oral, resp. rate 18, last menstrual period 11/17/2015, not currently breastfeeding.  Physical Exam  Nursing note and vitals reviewed. Constitutional: She is oriented to person, place, and time. She appears well-developed and well-nourished. No distress.  HENT:  Head: Normocephalic.  Cardiovascular: Normal rate.   Respiratory: Effort normal.  GI: Soft. There is no tenderness. There is no rebound.  Neurological: She is alert and oriented to person, place, and time.  Skin: Skin is warm and dry.  Psychiatric: She has a normal mood and affect.     Results for orders placed or performed during the hospital encounter of 01/10/16 (from the past 24 hour(s))  Urinalysis, Routine w reflex microscopic (not at [facility])     Status: Abnormal   Collection Time: 01/10/16  7:50 PM  Result Value Ref Range   Color, Urine YELLOW YELLOW   APPearance CLEAR CLEAR   Specific Gravity, Urine >1.030 (H) 1.005 - 1.030   pH 5.5 5.0 - 8.0   Glucose, UA 250 (A) NEGATIVE mg/dL   Hgb urine dipstick NEGATIVE NEGATIVE   Bilirubin Urine NEGATIVE NEGATIVE   Ketones, ur 15 (A) NEGATIVE mg/dL   Protein, ur 30 (A) NEGATIVE mg/dL   Nitrite NEGATIVE NEGATIVE   Leukocytes, UA NEGATIVE NEGATIVE  Urine microscopic-add on     Status: Abnormal   Collection Time: 01/10/16  7:50 PM  Result Value Ref Range   Squamous Epithelial / LPF 6-30 (A) NONE SEEN   WBC, UA 0-5 0 - 5 WBC/hpf   RBC /  HPF 0-5 0 - 5 RBC/hpf   Bacteria, UA FEW (A) NONE SEEN  Pregnancy, urine POC     Status: None   Collection Time: 01/10/16  7:56 PM  Result Value Ref Range   Preg Test, Ur NEGATIVE NEGATIVE  CBC     Status: Abnormal   Collection Time: 01/10/16  8:50 PM  Result Value Ref Range   WBC 5.1 4.0 - 10.5 K/uL   RBC 4.92 3.87 - 5.11 MIL/uL   Hemoglobin 12.7 12.0 - 15.0 g/dL   HCT 38.2 36.0 - 46.0 %   MCV 77.6 (L) 78.0 - 100.0 fL   MCH 25.8 (L) 26.0 - 34.0  pg   MCHC 33.2 30.0 - 36.0 g/dL   RDW 13.8 11.5 - 15.5 %   Platelets 189 150 - 400 K/uL  Comprehensive metabolic panel     Status: Abnormal   Collection Time: 01/10/16  8:50 PM  Result Value Ref Range   Sodium 138 135 - 145 mmol/L   Potassium 3.8 3.5 - 5.1 mmol/L   Chloride 109 101 - 111 mmol/L   CO2 24 22 - 32 mmol/L   Glucose, Bld 155 (H) 65 - 99 mg/dL   BUN 10 6 - 20 mg/dL   Creatinine, Ser 0.61 0.44 - 1.00 mg/dL   Calcium 9.6 8.9 - 10.3 mg/dL   Total Protein 7.1 6.5 - 8.1 g/dL   Albumin 4.0 3.5 - 5.0 g/dL   AST 26 15 - 41 U/L   ALT 34 14 - 54 U/L   Alkaline Phosphatase 64 38 - 126 U/L   Total Bilirubin 0.8 0.3 - 1.2 mg/dL   GFR calc non Af Amer >60 >60 mL/min   GFR calc Af Amer >60 >60 mL/min   Anion gap 5 5 - 15  Amylase     Status: None   Collection Time: 01/10/16  8:50 PM  Result Value Ref Range   Amylase 47 28 - 100 U/L  Lipase, blood     Status: None   Collection Time: 01/10/16  8:50 PM  Result Value Ref Range   Lipase 34 11 - 51 U/L     MAU Course  Procedures  MDM Patient has had GI cocktail and fioricet. She reports that her pain is better.   Assessment and Plan   1. Acute non intractable tension-type headache   2. Gastroesophageal reflux disease, esophagitis presence not specified    DC home Comfort measures reviewed  RX: fioircet PRN #20, Pepcid QD #30  Return to MAU as needed  [place] .   Contact information: [place]  [address] [place] .   Contact information: [place] [phone]           Mathis Bud 01/10/2016, 8:34 PM

## 2016-01-10 NOTE — Discharge Instructions (Signed)
Food Choices for Gastroesophageal Reflux Disease, Adult When you have gastroesophageal reflux disease (GERD), the foods you eat and your eating habits are very important. Choosing the right foods can help ease the discomfort of GERD. WHAT GENERAL GUIDELINES DO I NEED TO FOLLOW?  Choose fruits, vegetables, whole grains, low-fat dairy products, and low-fat meat, fish, and poultry.  Limit fats such as oils, salad dressings, butter, nuts, and avocado.  Keep a food diary to identify foods that cause symptoms.  Avoid foods that cause reflux. These may be different for different people.  Eat frequent small meals instead of three large meals each day.  Eat your meals slowly, in a relaxed setting.  Limit fried foods.  Cook foods using methods other than frying.  Avoid drinking alcohol.  Avoid drinking large amounts of liquids with your meals.  Avoid bending over or lying down until 2-3 hours after eating. WHAT FOODS ARE NOT RECOMMENDED? The following are some foods and drinks that may worsen your symptoms: Vegetables Tomatoes. Tomato juice. Tomato and spaghetti sauce. Chili peppers. Onion and garlic. Horseradish. Fruits Oranges, grapefruit, and lemon (fruit and juice). Meats High-fat meats, fish, and poultry. This includes hot dogs, ribs, ham, sausage, salami, and bacon. Dairy Whole milk and chocolate milk. Sour cream. Cream. Butter. Ice cream. Cream cheese.  Beverages Coffee and tea, with or without caffeine. Carbonated beverages or energy drinks. Condiments Hot sauce. Barbecue sauce.  Sweets/Desserts Chocolate and cocoa. Donuts. Peppermint and spearmint. Fats and Oils High-fat foods, including French fries and potato chips. Other Vinegar. Strong spices, such as black pepper, white pepper, red pepper, cayenne, curry powder, cloves, ginger, and chili powder. The items listed above may not be a complete list of foods and beverages to avoid. Contact your dietitian for more  information.   This information is not intended to replace advice given to you by your health care provider. Make sure you discuss any questions you have with your health care provider.   Document Released: 03/21/2005 Document Revised: 04/11/2014 Document Reviewed: 01/23/2013 Elsevier Interactive Patient Education 2016 [employer]  

## 2016-01-10 NOTE — MAU Note (Signed)
Pt reports having mid/ upper abd pain and headaches for a little over a week. Not taking any medication for the pain. Having some nausea and reflux.

## 2016-01-11 ENCOUNTER — Other Ambulatory Visit: Payer: Self-pay | Admitting: Advanced Practice Midwife

## 2016-01-13 ENCOUNTER — Encounter: Payer: Self-pay | Admitting: Obstetrics

## 2016-01-13 ENCOUNTER — Ambulatory Visit (INDEPENDENT_AMBULATORY_CARE_PROVIDER_SITE_OTHER): Payer: Medicaid Other | Admitting: Obstetrics

## 2016-01-13 VITALS — BP 130/77 | HR 97 | Temp 97.3°F | Wt 155.0 lb

## 2016-01-13 DIAGNOSIS — K581 Irritable bowel syndrome with constipation: Secondary | ICD-10-CM

## 2016-01-13 NOTE — Progress Notes (Signed)
Patient ID: Stephanie Mack, female   DOB: Feb 15, 1991, 25 y.o.   MRN: [id number]  Chief Complaint  Patient presents with  . Gynecologic Exam    Patient is in the office for 6 week follow up. Patient still has stomach pain and enlargement.    HPI Stephanie Mack is a 25 y.o. female.  Follow for IBS with constipation.  Has seen GI and was started on a bowel regimen, but has not seen any improvement. HPI  Past Medical History:  Diagnosis Date  . ADHD (attention deficit hyperactivity disorder)   . ADHD (attention deficit hyperactivity disorder)   . Anemia   . Anxiety   . Bipolar 1 disorder ([place])   . Bipolar disorder ([place])   . Chlamydia 05-31-10  . Depression    depression  . GERD (gastroesophageal reflux disease)    with pregnancy  . Gestational diabetes    current pregnancy  . Gonorrhea contact, treated   . Headache   . Infection    UTI  . Pregnancy induced hypertension    previous pregnancy  . Pseudocyesis 2013   Seen in [facility] for percieved FM, abd distension. Normal exam.     Past Surgical History:  Procedure Laterality Date  . CESAREAN SECTION N/A 09/01/2012   Procedure:  Primary cesarean section with delivery of baby girl at 40.  Apgars 1/1.  ;  Surgeon: Frederico Hamman, MD;  Location: [facility];  Service: Obstetrics;  Laterality: N/A;  . CESAREAN SECTION N/A 01/07/2015   Procedure: REPEAT CESAREAN SECTION;  Surgeon: Shelly Bombard, MD;  Location: [facility];  Service: Obstetrics;  Laterality: N/A;  . NASAL SEPTUM SURGERY    . WISDOM TOOTH EXTRACTION      Family History  Problem Relation Age of Onset  . Kidney disease Mother   . Hypertension Father   . Cancer Sister   . Asthma Brother   . Diabetes Maternal Grandfather   . Heart disease Neg Hx     Social History Social History  Substance Use Topics  . Smoking status: Never Smoker  . Smokeless tobacco: Never Used  . Alcohol use No    No Known Allergies  Current Outpatient Prescriptions  Medication Sig  Dispense Refill  . butalbital-acetaminophen-caffeine (FIORICET, ESGIC) 50-325-40 MG tablet Take 1-2 tablets by mouth every 6 (six) hours as needed for headache. 20 tablet 0  . dicyclomine (BENTYL) 20 MG tablet Take 1 tablet (20 mg total) by mouth 3 (three) times daily before meals. (Patient not taking: Reported on 12/16/2015) 90 tablet 2  . famotidine (PEPCID) 40 MG tablet Take 1 tablet (40 mg total) by mouth daily. 30 tablet 0  . hydrOXYzine (VISTARIL) 25 MG capsule TK ONE C PO  QD  0  . ibuprofen (ADVIL,MOTRIN) 800 MG tablet TAKE 1 TABLET BY MOUTH EVERY 8 HOURS AS NEEDED FOR CRAMPING 270 tablet 5  . loratadine (CLARITIN) 10 MG tablet Take 10 mg by mouth daily.  11  . MedroxyPROGESTERone Acetate 150 MG/ML SUSY INJECT ONE ML IN THE MUSCLE EVERY THREE MONTHS  0  . metroNIDAZOLE (FLAGYL) 500 MG tablet Take 1 tablet (500 mg total) by mouth 2 (two) times daily. 14 tablet 2  . OLANZapine (ZYPREXA) 5 MG tablet Take 1 tablet (5 mg total) by mouth at bedtime. For mood control 30 tablet 0  . ondansetron (ZOFRAN) 4 MG tablet Take 1 tablet (4 mg total) by mouth every 6 (six) hours. 12 tablet 0  . Prenatal-Fe Asparto Gly-FA (PRENATE STAR) 20-1  MG TABS Take 1 tablet by mouth daily. 30 tablet 11  . sertraline (ZOLOFT) 50 MG tablet Take 1 tablet (50 mg total) by mouth daily. For depression 30 tablet 0  . Simethicone 125 MG CAPS Take 1 capsule (125 mg total) by mouth 4 (four) times daily as needed. 28 each 0   No current facility-administered medications for this visit.     Review of Systems Review of Systems Constitutional: negative for fatigue and weight loss Respiratory: negative for cough and wheezing Cardiovascular: negative for chest pain, fatigue and palpitations Gastrointestinal: negative for abdominal pain and change in bowel habits Genitourinary:negative Integument/breast: negative for nipple discharge Musculoskeletal:negative for myalgias Neurological: negative for gait problems and  tremors Behavioral/Psych: negative for abusive relationship, depression Endocrine: negative for temperature intolerance     Blood pressure 130/77, pulse 97, temperature 97.3 F (36.3 C), weight 155 lb (70.3 kg), last menstrual period 11/17/2015, not currently breastfeeding.  Physical Exam Physical Exam:  Deferred  >50% of 10 min visit spent on counseling and coordination of care.   Data Reviewed GI Consult notes  Assessment     IBS with constipation.  No improvement with GI regimen.  History of poor compliance.  Plan     Patient instructed to follow up with GI as instructed.  No orders of the defined types were placed in this encounter.  No orders of the defined types were placed in this encounter.          Patient ID: Stephanie Mack, female   DOB: [birth year], 39 y.o.   MRN: [id number]

## 2016-01-13 NOTE — Progress Notes (Signed)
Patient missed her Depo- it was due October 5- she used it to stop her cycles.  Patient did see the GI and she was given fiber supplement to try for her constipation- she states it did not help. Encouraged patient to go back.

## 2016-01-25 ENCOUNTER — Telehealth: Payer: Self-pay | Admitting: Internal Medicine

## 2016-01-25 NOTE — Telephone Encounter (Signed)
Patient reports that she is is having excess bloating with Miralax and it is not helping.  She has not been taking the bisacodyl q 2 days as recommended at the last office visit.  She will try the bisacodyl and if still having constipation she will call back

## 2016-02-07 ENCOUNTER — Encounter (HOSPITAL_COMMUNITY): Payer: Self-pay | Admitting: *Deleted

## 2016-02-07 ENCOUNTER — Emergency Department (HOSPITAL_COMMUNITY)
Admission: EM | Admit: 2016-02-07 | Discharge: 2016-02-08 | Disposition: A | Discharge status: Home / Self Care | Payer: Medicaid Other | Attending: Emergency Medicine | Admitting: Emergency Medicine

## 2016-02-07 ENCOUNTER — Emergency Department (HOSPITAL_COMMUNITY): Payer: Medicaid Other

## 2016-02-07 DIAGNOSIS — E876 Hypokalemia: Secondary | ICD-10-CM | POA: Diagnosis not present

## 2016-02-07 DIAGNOSIS — R739 Hyperglycemia, unspecified: Secondary | ICD-10-CM | POA: Diagnosis not present

## 2016-02-07 DIAGNOSIS — F909 Attention-deficit hyperactivity disorder, unspecified type: Secondary | ICD-10-CM | POA: Insufficient documentation

## 2016-02-07 DIAGNOSIS — I10 Essential (primary) hypertension: Secondary | ICD-10-CM | POA: Insufficient documentation

## 2016-02-07 DIAGNOSIS — Z79899 Other long term (current) drug therapy: Secondary | ICD-10-CM | POA: Diagnosis not present

## 2016-02-07 DIAGNOSIS — R55 Syncope and collapse: Secondary | ICD-10-CM

## 2016-02-07 LAB — URINALYSIS, ROUTINE W REFLEX MICROSCOPIC
Bilirubin Urine: NEGATIVE
Glucose, UA: >1000 mg/dL — AB
Ketones, ur: NEGATIVE mg/dL
NITRITE: NEGATIVE
PROTEIN: 30 mg/dL — AB
SPECIFIC GRAVITY, URINE: 1.025 (ref 1.005–1.030)
pH: 6 (ref 5.0–8.0)

## 2016-02-07 LAB — CBC WITH DIFFERENTIAL/PLATELET
BASOS PCT: 0 %
Basophils Absolute: 0 10*3/uL (ref 0.0–0.1)
EOS ABS: 0 10*3/uL (ref 0.0–0.7)
EOS PCT: 0 %
HCT: 37.9 % (ref 36.0–46.0)
HEMOGLOBIN: 12.3 g/dL (ref 12.0–15.0)
LYMPHS ABS: 2.8 10*3/uL (ref 0.7–4.0)
Lymphocytes Relative: 49 %
MCH: 25.6 pg — AB (ref 26.0–34.0)
MCHC: 32.5 g/dL (ref 30.0–36.0)
MCV: 79 fL (ref 78.0–100.0)
MONOS PCT: 11 %
Monocytes Absolute: 0.6 10*3/uL (ref 0.1–1.0)
NEUTROS PCT: 40 %
Neutro Abs: 2.4 10*3/uL (ref 1.7–7.7)
PLATELETS: 187 10*3/uL (ref 150–400)
RBC: 4.8 MIL/uL (ref 3.87–5.11)
RDW: 14 % (ref 11.5–15.5)
WBC: 5.8 10*3/uL (ref 4.0–10.5)

## 2016-02-07 LAB — POC URINE PREG, ED: Preg Test, Ur: NEGATIVE

## 2016-02-07 LAB — RAPID URINE DRUG SCREEN, HOSP PERFORMED
AMPHETAMINES: NOT DETECTED
BENZODIAZEPINES: NOT DETECTED
Barbiturates: NOT DETECTED
COCAINE: NOT DETECTED
OPIATES: NOT DETECTED
TETRAHYDROCANNABINOL: NOT DETECTED

## 2016-02-07 LAB — COMPREHENSIVE METABOLIC PANEL
ALBUMIN: 3.7 g/dL (ref 3.5–5.0)
ALK PHOS: 69 U/L (ref 38–126)
ALT: 31 U/L (ref 14–54)
ANION GAP: 10 (ref 5–15)
AST: 31 U/L (ref 15–41)
BUN: 8 mg/dL (ref 6–20)
CHLORIDE: 105 mmol/L (ref 101–111)
CO2: 22 mmol/L (ref 22–32)
Calcium: 9.4 mg/dL (ref 8.9–10.3)
Creatinine, Ser: 0.8 mg/dL (ref 0.44–1.00)
GFR calc non Af Amer: >60 mL/min (ref >60)
GLUCOSE: 268 mg/dL — AB (ref 65–99)
Potassium: 3.4 mmol/L — ABNORMAL LOW (ref 3.5–5.1)
SODIUM: 137 mmol/L (ref 135–145)
Total Bilirubin: 0.4 mg/dL (ref 0.3–1.2)
Total Protein: 6.7 g/dL (ref 6.5–8.1)

## 2016-02-07 LAB — URINE MICROSCOPIC-ADD ON

## 2016-02-07 LAB — I-STAT TROPONIN, ED: Troponin i, poc: 0 ng/mL (ref 0.00–0.08)

## 2016-02-07 MED ORDER — IOPAMIDOL (ISOVUE-370) INJECTION 76%
INTRAVENOUS | Status: AC
  Filled 2016-02-07: qty 100

## 2016-02-07 MED ORDER — POTASSIUM CHLORIDE CRYS ER 20 MEQ PO TBCR
40.0000 meq | EXTENDED_RELEASE_TABLET | Freq: Once | ORAL | Status: AC
  Administered 2016-02-07: 40 meq via ORAL
  Filled 2016-02-07: qty 2

## 2016-02-07 MED ORDER — SODIUM CHLORIDE 0.9 % IV BOLUS (SEPSIS)
1000.0000 mL | INTRAVENOUS | Status: AC
  Administered 2016-02-07: 1000 mL via INTRAVENOUS

## 2016-02-07 MED ORDER — IOPAMIDOL (ISOVUE-370) INJECTION 76%
100.0000 mL | Freq: Once | INTRAVENOUS | Status: AC | PRN
  Administered 2016-02-07: 100 mL via INTRAVENOUS
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
## 2016-02-07 NOTE — ED Triage Notes (Signed)
The pt is c/o almost fainting just priot to arrival   She thinks she  Was too hot and her bp has increased.  She takes no med for her pressure  lmp  Aug  She came off depo and her peirods have been abnormal

## 2016-02-07 NOTE — Discharge Instructions (Signed)
1. Medications: usual home medications 2. Treatment: rest, drink plenty of fluids,  3. Follow Up: Please followup with your primary doctor in 2 days for discussion of your diagnoses and further evaluation after today's visit; if you do not have a primary care doctor use the resource guide provided to find one; Please return to the ER for return of symptoms, chest pain, SOB or other concerns

## 2016-02-09 LAB — URINE CULTURE

## 2016-02-11 ENCOUNTER — Ambulatory Visit (INDEPENDENT_AMBULATORY_CARE_PROVIDER_SITE_OTHER): Payer: Medicaid Other | Admitting: Family Medicine

## 2016-02-11 ENCOUNTER — Encounter: Payer: Self-pay | Admitting: Family Medicine

## 2016-02-11 VITALS — BP 128/79 | HR 92 | Temp 98.7°F | Resp 16 | Ht 61.0 in | Wt 161.0 lb

## 2016-02-11 DIAGNOSIS — E119 Type 2 diabetes mellitus without complications: Secondary | ICD-10-CM | POA: Diagnosis not present

## 2016-02-11 DIAGNOSIS — Z23 Encounter for immunization: Secondary | ICD-10-CM | POA: Diagnosis not present

## 2016-02-11 DIAGNOSIS — F319 Bipolar disorder, unspecified: Secondary | ICD-10-CM | POA: Diagnosis not present

## 2016-02-11 LAB — POCT GLYCOSYLATED HEMOGLOBIN (HGB A1C): HEMOGLOBIN A1C: 7.6

## 2016-02-11 LAB — GLUCOSE, CAPILLARY: Glucose-Capillary: 124 mg/dL — ABNORMAL HIGH (ref 65–99)

## 2016-02-11 MED ORDER — METFORMIN HCL 1000 MG PO TABS: 1000.0000 mg | ORAL_TABLET | Freq: Two times a day (BID) | ORAL | 1 refills | Status: DC

## 2016-02-11 NOTE — Progress Notes (Signed)
Stephanie Mack, is a 25 y.o. female  [id number]  [id number]  DOB - 27-Jun-1990  CC:  Chief Complaint  Patient presents with  . Hyperglycemia  . Follow-up       HPI: Stephanie Mack is a 25 y.o. female here for follow-up recent ED visit. She was found to have an elevated blood sugar and asked to follow-up here for futher attention. She has an A1C of 5.9 in March. Her blood sugar in ED was 268 on 11/5. She is not on medication. She has a history of gestational diabetes. Her A1C today is 7.5.She has a history of Bipolar Disorder I and is seen and treated at [facility]. She reports being started on something for BP when at ED but I can find no documentation, either in EPIC or in paperwork she brings from the hospital. Her blood pressure today is 128/79. She is on Pepcid for GERD and Claritin for allergies. She is on Depot for [facility].  Health Maintenance: Will accept flu shot today. She has a GYN and will schedule a PAP there.    No Known Allergies Past Medical History:  Diagnosis Date  . ADHD (attention deficit hyperactivity disorder)   . ADHD (attention deficit hyperactivity disorder)   . Anemia   . Anxiety   . Bipolar 1 disorder ([place])   . Bipolar disorder ([place])   . Chlamydia 05-31-10  . Depression    depression  . GERD (gastroesophageal reflux disease)    with pregnancy  . Gestational diabetes    current pregnancy  . Gonorrhea contact, treated   . Headache   . Infection    UTI  . Pregnancy induced hypertension    previous pregnancy  . Pseudocyesis 2013   Seen in MAU for percieved FM, abd distension. Normal exam.    Current Outpatient Prescriptions on File Prior to Visit  Medication Sig Dispense Refill  . famotidine (PEPCID) 40 MG tablet Take 1 tablet (40 mg total) by mouth daily. 30 tablet 0  . ibuprofen (ADVIL,MOTRIN) 800 MG tablet TAKE 1 TABLET BY MOUTH EVERY 8 HOURS AS NEEDED FOR CRAMPING 270 tablet 5  . loratadine (CLARITIN) 10 MG tablet Take 10 mg by mouth daily.   11  . OLANZapine (ZYPREXA) 5 MG tablet Take 1 tablet (5 mg total) by mouth at bedtime. For mood control 30 tablet 0  . ondansetron (ZOFRAN) 4 MG tablet Take 1 tablet (4 mg total) by mouth every 6 (six) hours. 12 tablet 0  . sertraline (ZOLOFT) 50 MG tablet Take 1 tablet (50 mg total) by mouth daily. For depression 30 tablet 0  . Simethicone 125 MG CAPS Take 1 capsule (125 mg total) by mouth 4 (four) times daily as needed. 28 each 0  . butalbital-acetaminophen-caffeine (FIORICET, ESGIC) 50-325-40 MG tablet Take 1-2 tablets by mouth every 6 (six) hours as needed for headache. (Patient not taking: Reported on 02/11/2016) 20 tablet 0  . dicyclomine (BENTYL) 20 MG tablet Take 1 tablet (20 mg total) by mouth 3 (three) times daily before meals. (Patient not taking: Reported on 02/11/2016) 90 tablet 2  . hydrOXYzine (VISTARIL) 25 MG capsule TK ONE C PO  QD  0  . MedroxyPROGESTERone Acetate 150 MG/ML SUSY INJECT ONE ML IN THE MUSCLE EVERY THREE MONTHS  0  . metroNIDAZOLE (FLAGYL) 500 MG tablet Take 1 tablet (500 mg total) by mouth 2 (two) times daily. (Patient not taking: Reported on 02/11/2016) 14 tablet 2  . Prenatal-Fe Asparto Gly-FA (PRENATE STAR) 20-1 MG TABS  Take 1 tablet by mouth daily. (Patient not taking: Reported on 02/11/2016) 30 tablet 11   No current facility-administered medications on file prior to visit.    Family History  Problem Relation Age of Onset  . Kidney disease Mother   . Hypertension Father   . Cancer Sister   . Asthma Brother   . Diabetes Maternal Grandfather   . Heart disease Neg Hx    Social History   Social History  . Marital status: Single    Spouse name: N/A  . Number of children: N/A  . Years of education: N/A   Occupational History  . Not on file.   Social History Main Topics  . Smoking status: Never Smoker  . Smokeless tobacco: Never Used  . Alcohol use No  . Drug use: No  . Sexual activity: Not Currently    Partners: Male     Comment: 1 partner    Other Topics Concern  . Not on file   Social History Narrative   Single, one daughter born 2016   Student at [employer]   11/30/2015       Review of Systems: Constitutional: + for fatigue Skin: Negative HENT: Negative  Eyes: Negative  Neck: Negative Respiratory: Negative Cardiovascular: Negative Gastrointestinal: + for intermittent abd pain Genitourinary: Negative  Musculoskeletal: Negative   Neurological: Negative for Hematological: Negative  Psychiatric/Behavioral: + for Bipolar Disorder I   Objective:   Vitals:   02/11/16 1323  BP: 128/79  Pulse: 92  Resp: 16  Temp: 98.7 F (37.1 C)    Physical Exam: Constitutional: Patient appears well-developed and well-nourished. No distress. HENT: Normocephalic, atraumatic, External right and left ear normal. Oropharynx is clear and moist.  Eyes: Conjunctivae and EOM are normal. PERRLA, no scleral icterus. Neck: Normal ROM. Neck supple. No lymphadenopathy, No thyromegaly. CVS: RRR, S1/S2 +, no murmurs, no gallops, no rubs Pulmonary: Effort and breath sounds normal, no stridor, rhonchi, wheezes, rales.  Abdominal: Soft. Normoactive BS,, no distension, tenderness, rebound or guarding.  Musculoskeletal: Normal range of motion. No edema and no tenderness.  Neuro: Alert.Normal muscle tone coordination. Non-focal Skin: Skin is warm and dry. No rash noted. Not diaphoretic. No erythema. No pallor. Psychiatric: Normal mood and affect. Behavior, judgment, thought content normal.  Lab Results  Component Value Date   WBC 5.8 02/07/2016   HGB 12.3 02/07/2016   HCT 37.9 02/07/2016   MCV 79.0 02/07/2016   PLT 187 02/07/2016   Lab Results  Component Value Date   CREATININE 0.80 02/07/2016   BUN 8 02/07/2016   NA 137 02/07/2016   K 3.4 (L) 02/07/2016   CL 105 02/07/2016   CO2 22 02/07/2016    Lab Results  Component Value Date   HGBA1C 7.6 02/11/2016   Lipid Panel     Component Value Date/Time   CHOL 192 06/20/2015 1821    TRIG 109 06/20/2015 1821   HDL 49 06/20/2015 1821   CHOLHDL 3.9 06/20/2015 1821   VLDL 22 06/20/2015 1821   LDLCALC 121 (H) 06/20/2015 1821        Assessment and plan:   1. Type 2 diabetes mellitus without complication, without long-term current use of insulin (HCC)  - metFORMIN (GLUCOPHAGE) 1000 MG tablet; Take 1 tablet (1,000 mg total) by mouth 2 (two) times daily with a meal.  Dispense: 180 tablet; Refill: 1 - POCT glycosylated hemoglobin (Hb A1C) -Finger stick glucose.  2. Need for prophylactic vaccination and inoculation against influenza  - Flu Vaccine QUAD 36+  mos PF IM (Fluarix & Fluzone Quad PF)   Return in about 3 months (around 05/13/2016).  The patient was given clear instructions to go to ER or return to medical center if symptoms don't improve, worsen or new problems develop. The patient verbalized understanding.    Micheline Decuir FNP  02/11/2016, 4:09 PM

## 2016-02-11 NOTE — Patient Instructions (Signed)
Take metformin 1000 mg bid. Watch carbs in diet Come back in 3 months.

## 2016-02-29 ENCOUNTER — Encounter: Payer: Self-pay | Admitting: Obstetrics

## 2016-03-11 ENCOUNTER — Ambulatory Visit: Payer: Self-pay | Admitting: Obstetrics

## 2016-04-11 ENCOUNTER — Ambulatory Visit: Payer: Self-pay | Admitting: Obstetrics

## 2016-04-26 ENCOUNTER — Other Ambulatory Visit (HOSPITAL_COMMUNITY)
Admission: RE | Admit: 2016-04-26 | Discharge: 2016-04-26 | Disposition: A | Discharge status: Home / Self Care | Payer: Medicaid Other | Source: Ambulatory Visit | Attending: Obstetrics | Admitting: Obstetrics

## 2016-04-26 ENCOUNTER — Encounter: Payer: Self-pay | Admitting: Obstetrics

## 2016-04-26 ENCOUNTER — Ambulatory Visit (INDEPENDENT_AMBULATORY_CARE_PROVIDER_SITE_OTHER): Payer: Medicaid Other | Admitting: Obstetrics

## 2016-04-26 ENCOUNTER — Ambulatory Visit (INDEPENDENT_AMBULATORY_CARE_PROVIDER_SITE_OTHER): Payer: Medicaid Other

## 2016-04-26 VITALS — BP 144/89 | HR 94 | Wt 159.0 lb

## 2016-04-26 DIAGNOSIS — R140 Abdominal distension (gaseous): Secondary | ICD-10-CM

## 2016-04-26 DIAGNOSIS — R102 Pelvic and perineal pain: Secondary | ICD-10-CM

## 2016-04-26 DIAGNOSIS — Z3202 Encounter for pregnancy test, result negative: Secondary | ICD-10-CM

## 2016-04-26 LAB — POCT URINE PREGNANCY: PREG TEST UR: NEGATIVE

## 2016-04-26 NOTE — Progress Notes (Signed)
Patient ID: Stephanie Mack, female   DOB: Mar 13, 1991, 26 y.o.   MRN: [id number]  No chief complaint on file.   HPI Stephanie Mack is a 26 y.o. female.  Abdominal bloating and pelvic pain since last year. HPI  Past Medical History:  Diagnosis Date  . ADHD (attention deficit hyperactivity disorder)   . ADHD (attention deficit hyperactivity disorder)   . Anemia   . Anxiety   . Bipolar 1 disorder ([place])   . Bipolar disorder ([place])   . Chlamydia 05-31-10  . Depression    depression  . GERD (gastroesophageal reflux disease)    with pregnancy  . Gestational diabetes    current pregnancy  . Gonorrhea contact, treated   . Headache   . Infection    UTI  . Pregnancy induced hypertension    previous pregnancy  . Pseudocyesis 2013   Seen in MAU for percieved FM, abd distension. Normal exam.     Past Surgical History:  Procedure Laterality Date  . CESAREAN SECTION N/A 09/01/2012   Procedure:  Primary cesarean section with delivery of baby girl at 50.  Apgars 1/1.  ;  Surgeon: Frederico Hamman, MD;  Location: [facility];  Service: Obstetrics;  Laterality: N/A;  . CESAREAN SECTION N/A 01/07/2015   Procedure: REPEAT CESAREAN SECTION;  Surgeon: Shelly Bombard, MD;  Location: [facility];  Service: Obstetrics;  Laterality: N/A;  . NASAL SEPTUM SURGERY    . WISDOM TOOTH EXTRACTION      Family History  Problem Relation Age of Onset  . Kidney disease Mother   . Hypertension Father   . Cancer Sister   . Asthma Brother   . Diabetes Maternal Grandfather   . Heart disease Neg Hx     Social History Social History  Substance Use Topics  . Smoking status: Never Smoker  . Smokeless tobacco: Never Used  . Alcohol use No    No Known Allergies  Current Outpatient Prescriptions  Medication Sig Dispense Refill  . metFORMIN (GLUCOPHAGE) 1000 MG tablet Take 1 tablet (1,000 mg total) by mouth 2 (two) times daily with a meal. 180 tablet 1  . OLANZapine (ZYPREXA) 5 MG tablet Take 1 tablet (5 mg  total) by mouth at bedtime. For mood control 30 tablet 0  . sertraline (ZOLOFT) 50 MG tablet Take 1 tablet (50 mg total) by mouth daily. For depression 30 tablet 0  . butalbital-acetaminophen-caffeine (FIORICET, ESGIC) 50-325-40 MG tablet Take 1-2 tablets by mouth every 6 (six) hours as needed for headache. (Patient not taking: Reported on 02/11/2016) 20 tablet 0  . dicyclomine (BENTYL) 20 MG tablet Take 1 tablet (20 mg total) by mouth 3 (three) times daily before meals. (Patient not taking: Reported on 02/11/2016) 90 tablet 2  . famotidine (PEPCID) 40 MG tablet Take 1 tablet (40 mg total) by mouth daily. (Patient not taking: Reported on 04/26/2016) 30 tablet 0  . hydrOXYzine (VISTARIL) 25 MG capsule TK ONE C PO  QD  0  . ibuprofen (ADVIL,MOTRIN) 800 MG tablet TAKE 1 TABLET BY MOUTH EVERY 8 HOURS AS NEEDED FOR CRAMPING (Patient not taking: Reported on 04/26/2016) 270 tablet 5  . loratadine (CLARITIN) 10 MG tablet Take 10 mg by mouth daily.  11  . MedroxyPROGESTERone Acetate 150 MG/ML SUSY INJECT ONE ML IN THE MUSCLE EVERY THREE MONTHS  0  . metroNIDAZOLE (FLAGYL) 500 MG tablet Take 1 tablet (500 mg total) by mouth 2 (two) times daily. (Patient not taking: Reported on 02/11/2016) 14 tablet 2  .  ondansetron (ZOFRAN) 4 MG tablet Take 1 tablet (4 mg total) by mouth every 6 (six) hours. (Patient not taking: Reported on 04/26/2016) 12 tablet 0  . Prenatal-Fe Asparto Gly-FA (PRENATE STAR) 20-1 MG TABS Take 1 tablet by mouth daily. (Patient not taking: Reported on 02/11/2016) 30 tablet 11  . Simethicone 125 MG CAPS Take 1 capsule (125 mg total) by mouth 4 (four) times daily as needed. (Patient not taking: Reported on 04/26/2016) 28 each 0   No current facility-administered medications for this visit.     Review of Systems Review of Systems Constitutional: negative for fatigue and weight loss Respiratory: negative for cough and wheezing Cardiovascular: negative for chest pain, fatigue and  palpitations Gastrointestinal: negative for abdominal pain and change in bowel habits Genitourinary:positive for pelvic pain and bloating Integument/breast: negative for nipple discharge Musculoskeletal:negative for myalgias Neurological: negative for gait problems and tremors Behavioral/Psych: negative for abusive relationship, depression Endocrine: negative for temperature intolerance      Blood pressure (!) 144/89, pulse 94, weight 159 lb (72.1 kg), not currently breastfeeding.  Physical Exam Physical Exam           General:  Alert and no distress Abdomen:  normal findings: no organomegaly, soft, non-tender and no hernia  Pelvis:  External genitalia: normal general appearance Urinary system: urethral meatus normal and bladder without fullness, nontender Vaginal: normal without tenderness, induration or masses Cervix: normal appearance Adnexa: normal bimanual exam Uterus: anteverted and non-tender, normal size    50% of 15 min visit spent on counseling and coordination of care.    Data Reviewed Ultrasound  Assessment     Abdominal bloating and pelvic pain.  Normal exam and ultrasound Overweight    Plan    Ultrasound ordered and was WNL's. Follow clinically  Orders Placed This Encounter  Procedures  . US Pelvis Complete    Standing Status:   Future    Standing Expiration Date:   06/24/2017    Order Specific Question:   Reason for Exam (SYMPTOM  OR DIAGNOSIS REQUIRED)    Answer:   Pelvic pain and bloating    Order Specific Question:   Preferred imaging location?    Answer:   Internal  . US Transvaginal Non-OB    Standing Status:   Future    Number of Occurrences:   1    Standing Expiration Date:   06/24/2017    Order Specific Question:   Reason for Exam (SYMPTOM  OR DIAGNOSIS REQUIRED)    Answer:   Pelvic pain and bloating    Order Specific Question:   Preferred imaging location?    Answer:   Internal  . POCT urine pregnancy   No orders of the defined types were  placed in this encounter.

## 2016-04-26 NOTE — Progress Notes (Signed)
Pt presents for low abdominal pain and bloating since last year. Unsure LMP. UPT neg today.

## 2016-04-27 LAB — CERVICOVAGINAL ANCILLARY ONLY
Bacterial vaginitis: POSITIVE — AB
CHLAMYDIA, DNA PROBE: NEGATIVE
Candida vaginitis: NEGATIVE
NEISSERIA GONORRHEA: NEGATIVE
TRICH (WINDOWPATH): NEGATIVE

## 2016-04-28 ENCOUNTER — Other Ambulatory Visit: Payer: Self-pay | Admitting: Obstetrics

## 2016-04-28 DIAGNOSIS — N760 Acute vaginitis: Principal | ICD-10-CM

## 2016-04-28 DIAGNOSIS — B9689 Other specified bacterial agents as the cause of diseases classified elsewhere: Secondary | ICD-10-CM

## 2016-04-28 MED ORDER — TINIDAZOLE 500 MG PO TABS: 1000.0000 mg | ORAL_TABLET | Freq: Every day | ORAL | 2 refills | Status: DC

## 2016-04-28 NOTE — Progress Notes (Signed)
Pt made aware of Rx sent by provider. 

## 2016-05-03 ENCOUNTER — Emergency Department (HOSPITAL_COMMUNITY)
Admission: EM | Admit: 2016-05-03 | Discharge: 2016-05-03 | Disposition: A | Discharge status: Home / Self Care | Payer: Medicaid Other | Attending: Emergency Medicine | Admitting: Emergency Medicine

## 2016-05-03 ENCOUNTER — Encounter (HOSPITAL_COMMUNITY): Payer: Self-pay | Admitting: Emergency Medicine

## 2016-05-03 DIAGNOSIS — Z79899 Other long term (current) drug therapy: Secondary | ICD-10-CM | POA: Insufficient documentation

## 2016-05-03 DIAGNOSIS — B373 Candidiasis of vulva and vagina: Secondary | ICD-10-CM | POA: Insufficient documentation

## 2016-05-03 DIAGNOSIS — R1031 Right lower quadrant pain: Secondary | ICD-10-CM | POA: Diagnosis present

## 2016-05-03 DIAGNOSIS — F909 Attention-deficit hyperactivity disorder, unspecified type: Secondary | ICD-10-CM | POA: Diagnosis not present

## 2016-05-03 DIAGNOSIS — I10 Essential (primary) hypertension: Secondary | ICD-10-CM | POA: Insufficient documentation

## 2016-05-03 DIAGNOSIS — B9689 Other specified bacterial agents as the cause of diseases classified elsewhere: Secondary | ICD-10-CM

## 2016-05-03 DIAGNOSIS — Z7984 Long term (current) use of oral hypoglycemic drugs: Secondary | ICD-10-CM | POA: Diagnosis not present

## 2016-05-03 DIAGNOSIS — N760 Acute vaginitis: Secondary | ICD-10-CM | POA: Insufficient documentation

## 2016-05-03 DIAGNOSIS — B3731 Acute candidiasis of vulva and vagina: Secondary | ICD-10-CM

## 2016-05-03 LAB — COMPREHENSIVE METABOLIC PANEL
ALBUMIN: 4 g/dL (ref 3.5–5.0)
ALK PHOS: 71 U/L (ref 38–126)
ALT: 43 U/L (ref 14–54)
AST: 32 U/L (ref 15–41)
Anion gap: 8 (ref 5–15)
BUN: 6 mg/dL (ref 6–20)
CALCIUM: 9.3 mg/dL (ref 8.9–10.3)
CHLORIDE: 107 mmol/L (ref 101–111)
CO2: 22 mmol/L (ref 22–32)
CREATININE: 0.59 mg/dL (ref 0.44–1.00)
GFR calc Af Amer: >60 mL/min (ref >60)
GFR calc non Af Amer: >60 mL/min (ref >60)
GLUCOSE: 120 mg/dL — AB (ref 65–99)
Potassium: 3.9 mmol/L (ref 3.5–5.1)
SODIUM: 137 mmol/L (ref 135–145)
Total Bilirubin: 0.6 mg/dL (ref 0.3–1.2)
Total Protein: 7.2 g/dL (ref 6.5–8.1)

## 2016-05-03 LAB — I-STAT BETA HCG BLOOD, ED (MC, WL, AP ONLY): I-stat hCG, quantitative: <5 m[IU]/mL (ref <5)

## 2016-05-03 LAB — URINALYSIS, ROUTINE W REFLEX MICROSCOPIC
Bilirubin Urine: NEGATIVE
GLUCOSE, UA: NEGATIVE mg/dL
KETONES UR: NEGATIVE mg/dL
Nitrite: NEGATIVE
PROTEIN: 100 mg/dL — AB
Specific Gravity, Urine: 1.023 (ref 1.005–1.030)
pH: 5 (ref 5.0–8.0)

## 2016-05-03 LAB — CBC
HCT: 41.5 % (ref 36.0–46.0)
HEMOGLOBIN: 13.2 g/dL (ref 12.0–15.0)
MCH: 25.8 pg — AB (ref 26.0–34.0)
MCHC: 31.8 g/dL (ref 30.0–36.0)
MCV: 81.2 fL (ref 78.0–100.0)
PLATELETS: 249 10*3/uL (ref 150–400)
RBC: 5.11 MIL/uL (ref 3.87–5.11)
RDW: 13.3 % (ref 11.5–15.5)
WBC: 5.3 10*3/uL (ref 4.0–10.5)

## 2016-05-03 LAB — WET PREP, GENITAL
Sperm: NONE SEEN
Trich, Wet Prep: NONE SEEN

## 2016-05-03 LAB — CBG MONITORING, ED: GLUCOSE-CAPILLARY: 118 mg/dL — AB (ref 65–99)

## 2016-05-03 LAB — LIPASE, BLOOD: Lipase: 29 U/L (ref 11–51)

## 2016-05-03 MED ORDER — METRONIDAZOLE 500 MG PO TABS: 500.0000 mg | ORAL_TABLET | Freq: Two times a day (BID) | ORAL | 0 refills | Status: DC

## 2016-05-03 MED ORDER — FLUCONAZOLE 100 MG PO TABS
150.0000 mg | ORAL_TABLET | Freq: Once | ORAL | Status: AC
  Administered 2016-05-03: 150 mg via ORAL
  Filled 2016-05-03: qty 2

## 2016-05-03 NOTE — ED Triage Notes (Signed)
abd pain and dysuria since yesterday denies vag d/c states has not checked her sugar
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
## 2016-05-04 LAB — GC/CHLAMYDIA PROBE AMP (~~LOC~~) NOT AT ARMC
CHLAMYDIA, DNA PROBE: NEGATIVE
Neisseria Gonorrhea: NEGATIVE

## 2016-05-26 ENCOUNTER — Ambulatory Visit: Payer: Self-pay | Admitting: Obstetrics

## 2016-06-05 ENCOUNTER — Encounter (HOSPITAL_COMMUNITY): Payer: Self-pay

## 2016-06-05 ENCOUNTER — Inpatient Hospital Stay (HOSPITAL_COMMUNITY)
Admission: AD | Admit: 2016-06-05 | Discharge: 2016-06-05 | Disposition: A | Discharge status: Home / Self Care | Payer: Medicaid Other | Source: Ambulatory Visit | Attending: Obstetrics and Gynecology | Admitting: Obstetrics and Gynecology

## 2016-06-05 DIAGNOSIS — Z8249 Family history of ischemic heart disease and other diseases of the circulatory system: Secondary | ICD-10-CM | POA: Diagnosis not present

## 2016-06-05 DIAGNOSIS — Z7984 Long term (current) use of oral hypoglycemic drugs: Secondary | ICD-10-CM | POA: Insufficient documentation

## 2016-06-05 DIAGNOSIS — Z8619 Personal history of other infectious and parasitic diseases: Secondary | ICD-10-CM | POA: Diagnosis not present

## 2016-06-05 DIAGNOSIS — Z841 Family history of disorders of kidney and ureter: Secondary | ICD-10-CM | POA: Diagnosis not present

## 2016-06-05 DIAGNOSIS — F419 Anxiety disorder, unspecified: Secondary | ICD-10-CM | POA: Diagnosis not present

## 2016-06-05 DIAGNOSIS — F319 Bipolar disorder, unspecified: Secondary | ICD-10-CM | POA: Insufficient documentation

## 2016-06-05 DIAGNOSIS — E119 Type 2 diabetes mellitus without complications: Secondary | ICD-10-CM | POA: Insufficient documentation

## 2016-06-05 DIAGNOSIS — N926 Irregular menstruation, unspecified: Secondary | ICD-10-CM | POA: Insufficient documentation

## 2016-06-05 DIAGNOSIS — D573 Sickle-cell trait: Secondary | ICD-10-CM | POA: Diagnosis not present

## 2016-06-05 DIAGNOSIS — Z3202 Encounter for pregnancy test, result negative: Secondary | ICD-10-CM | POA: Insufficient documentation

## 2016-06-05 DIAGNOSIS — F909 Attention-deficit hyperactivity disorder, unspecified type: Secondary | ICD-10-CM | POA: Insufficient documentation

## 2016-06-05 DIAGNOSIS — Z825 Family history of asthma and other chronic lower respiratory diseases: Secondary | ICD-10-CM | POA: Insufficient documentation

## 2016-06-05 DIAGNOSIS — K219 Gastro-esophageal reflux disease without esophagitis: Secondary | ICD-10-CM | POA: Diagnosis not present

## 2016-06-05 DIAGNOSIS — Z87440 Personal history of urinary (tract) infections: Secondary | ICD-10-CM | POA: Insufficient documentation

## 2016-06-05 HISTORY — DX: Sickle-cell trait: D57.3

## 2016-06-05 LAB — BASIC METABOLIC PANEL
Anion gap: 8 (ref 5–15)
BUN: 8 mg/dL (ref 6–20)
CALCIUM: 9.2 mg/dL (ref 8.9–10.3)
CO2: 23 mmol/L (ref 22–32)
CREATININE: 0.59 mg/dL (ref 0.44–1.00)
Chloride: 107 mmol/L (ref 101–111)
GFR calc Af Amer: >60 mL/min (ref >60)
GLUCOSE: 99 mg/dL (ref 65–99)
Potassium: 3.7 mmol/L (ref 3.5–5.1)
SODIUM: 138 mmol/L (ref 135–145)

## 2016-06-05 LAB — URINALYSIS, MICROSCOPIC (REFLEX)

## 2016-06-05 LAB — URINALYSIS, ROUTINE W REFLEX MICROSCOPIC
BACTERIA UA: NONE SEEN
Bilirubin Urine: NEGATIVE
Glucose, UA: NEGATIVE mg/dL
KETONES UR: NEGATIVE mg/dL
Ketones, ur: NEGATIVE mg/dL
Leukocytes, UA: NEGATIVE
NITRITE: NEGATIVE
PH: 5 (ref 5.0–8.0)
Protein, ur: 100 mg/dL — AB
SPECIFIC GRAVITY, URINE: 1.028 (ref 1.005–1.030)
Specific Gravity, Urine: 1.025 (ref 1.005–1.030)
pH: 5 (ref 5.0–8.0)

## 2016-06-05 LAB — CBC
HCT: 41.5 % (ref 36.0–46.0)
Hemoglobin: 13.6 g/dL (ref 12.0–15.0)
MCH: 26.2 pg (ref 26.0–34.0)
MCHC: 32.8 g/dL (ref 30.0–36.0)
MCV: 79.8 fL (ref 78.0–100.0)
PLATELETS: 221 10*3/uL (ref 150–400)
RBC: 5.2 MIL/uL — ABNORMAL HIGH (ref 3.87–5.11)
RDW: 13.3 % (ref 11.5–15.5)
WBC: 5.9 10*3/uL (ref 4.0–10.5)

## 2016-06-05 LAB — WET PREP, GENITAL
Clue Cells Wet Prep HPF POC: NONE SEEN
Sperm: NONE SEEN
Trich, Wet Prep: NONE SEEN
WBC WET PREP: NONE SEEN
YEAST WET PREP: NONE SEEN

## 2016-06-05 LAB — POCT PREGNANCY, URINE: Preg Test, Ur: NEGATIVE

## 2016-06-05 MED ORDER — IBUPROFEN 600 MG PO TABS
600.0000 mg | ORAL_TABLET | Freq: Four times a day (QID) | ORAL | Status: DC | PRN
  Administered 2016-06-05: 600 mg via ORAL
  Filled 2016-06-05: qty 1

## 2016-06-05 NOTE — MAU Provider Note (Signed)
History     CSN: [id number]  Arrival date and time: 06/05/16 1425   First Provider Initiated Contact with Patient 06/05/16 1509      No chief complaint on file.  Stephanie Mack is a 26 y.o. [id number] presenting with onset this morning of heavy vaginal bleeding with passage of golfball size clots and associated suprapubic cramping. She has saturated 2 pads so far, but flow is much heavier than menses. She states her menstrual interval has been regular every 4 weeks lately, though she has had abnormal bleeding in the past while on Depo. She has not been on birth control since before pregnancy with her 24yo.She has not taken anything for the cramping. No previous similar episodes. She states she had fibroids in the past. (On record review there was notation of a probable small fundal fibroid in 2016 but most recent ultrasound 04/26/2016 was normal.)  Followed by Dr. Jodi Mourning for pelvic and abdominal pain. LMP 05/19/2016 was normal.       OB History  Gravida Para Term Preterm AB Living  3 2 0 2 1 1   SAB TAB Ectopic Multiple Live Births  1 0 0 0 2    # Outcome Date GA Lbr Len/2nd Weight Sex Delivery Anes PTL Lv  3 Preterm 01/07/15 [redacted]w[redacted]d  2.829 kg (6 lb 3.8 oz) F CS-Vac Spinal  LIV  2 Preterm 09/01/12 [redacted]w[redacted]d   F CS-LTranv Spinal  DEC  1 SAB 09/16/09 [redacted]w[redacted]d            Birth Comments: Twin gestational sacs.        Past Medical History:  Diagnosis Date  . ADHD (attention deficit hyperactivity disorder)   . ADHD (attention deficit hyperactivity disorder)   . Anemia   . Anxiety   . Bipolar 1 disorder ([place])   . Bipolar disorder ([place])   . Chlamydia 05-31-10  . Depression    depression  . GERD (gastroesophageal reflux disease)    with pregnancy  . Gestational diabetes    current pregnancy  . Gonorrhea contact, treated   . Headache   . Infection    UTI  . Pregnancy induced hypertension    previous pregnancy  . Pseudocyesis 2013   Seen in MAU for percieved FM, abd distension.  Normal exam.   . Sickle cell trait ([place])     Past Surgical History:  Procedure Laterality Date  . CESAREAN SECTION N/A 09/01/2012   Procedure:  Primary cesarean section with delivery of baby girl at 46.  Apgars 1/1.  ;  Surgeon: Frederico Hamman, MD;  Location: [facility];  Service: Obstetrics;  Laterality: N/A;  . CESAREAN SECTION N/A 01/07/2015   Procedure: REPEAT CESAREAN SECTION;  Surgeon: Shelly Bombard, MD;  Location: [facility];  Service: Obstetrics;  Laterality: N/A;  . NASAL SEPTUM SURGERY    . WISDOM TOOTH EXTRACTION      Family History  Problem Relation Age of Onset  . Kidney disease Mother   . Hypertension Father   . Cancer Sister   . Asthma Brother   . Diabetes Maternal Grandfather   . Heart disease Neg Hx     Social History  Substance Use Topics  . Smoking status: Never Smoker  . Smokeless tobacco: Never Used  . Alcohol use No    Allergies: No Known Allergies  Prescriptions Prior to Admission  Medication Sig Dispense Refill Last Dose  . ibuprofen (ADVIL,MOTRIN) 800 MG tablet TAKE 1 TABLET BY MOUTH EVERY 8 HOURS AS NEEDED  FOR CRAMPING 270 tablet 5 06/04/2016 at Unknown time  . loratadine (CLARITIN) 10 MG tablet Take 10 mg by mouth daily.  11 06/04/2016 at Unknown time  . metFORMIN (GLUCOPHAGE) 1000 MG tablet Take 1 tablet (1,000 mg total) by mouth 2 (two) times daily with a meal. 180 tablet 1 06/05/2016 at Unknown time  . OLANZapine (ZYPREXA) 5 MG tablet Take 1 tablet (5 mg total) by mouth at bedtime. For mood control 30 tablet 0 06/05/2016 at Unknown time  . sertraline (ZOLOFT) 50 MG tablet Take 1 tablet (50 mg total) by mouth daily. For depression 30 tablet 0 06/05/2016 at Unknown time  . butalbital-acetaminophen-caffeine (FIORICET, ESGIC) 50-325-40 MG tablet Take 1-2 tablets by mouth every 6 (six) hours as needed for headache. (Patient not taking: Reported on 02/11/2016) 20 tablet 0 Not Taking  . dicyclomine (BENTYL) 20 MG tablet Take 1 tablet (20 mg total) by mouth 3  (three) times daily before meals. (Patient not taking: Reported on 02/11/2016) 90 tablet 2 Not Taking  . famotidine (PEPCID) 40 MG tablet Take 1 tablet (40 mg total) by mouth daily. (Patient not taking: Reported on 04/26/2016) 30 tablet 0 Not Taking  . metroNIDAZOLE (FLAGYL) 500 MG tablet Take 1 tablet (500 mg total) by mouth 2 (two) times daily. (Patient not taking: Reported on 06/05/2016) 14 tablet 0 Not Taking at Unknown time  . ondansetron (ZOFRAN) 4 MG tablet Take 1 tablet (4 mg total) by mouth every 6 (six) hours. (Patient not taking: Reported on 04/26/2016) 12 tablet 0 Not Taking  . Prenatal-Fe Asparto Gly-FA (PRENATE STAR) 20-1 MG TABS Take 1 tablet by mouth daily. (Patient not taking: Reported on 02/11/2016) 30 tablet 11 Not Taking  . Simethicone 125 MG CAPS Take 1 capsule (125 mg total) by mouth 4 (four) times daily as needed. (Patient not taking: Reported on 04/26/2016) 28 each 0 Not Taking  . tinidazole (TINDAMAX) 500 MG tablet Take 2 tablets (1,000 mg total) by mouth daily with breakfast. (Patient not taking: Reported on 06/05/2016) 10 tablet 2 Not Taking at Unknown time    Review of Systems  Constitutional: Negative for chills and fever.  Gastrointestinal: Positive for abdominal pain. Negative for nausea and vomiting.  Genitourinary: Positive for pelvic pain and vaginal bleeding. Negative for dysuria, flank pain, frequency and vaginal discharge.  Musculoskeletal: Negative for back pain.   Physical Exam   Blood pressure 118/83, pulse 83, temperature 98 F (36.7 C), temperature source Oral, resp. rate 18, height 5\' 2"  (1.575 m), weight 67.6 kg (149 lb), last menstrual period 05/19/2016, not currently breastfeeding.  Physical Exam  MAU Course  Procedures Results for orders placed or performed during the hospital encounter of 06/05/16 (from the past 24 hour(s))  Urinalysis, Routine w reflex microscopic     Status: Abnormal   Collection Time: 06/05/16  2:40 PM  Result Value Ref Range    Color, Urine RED (A) YELLOW   APPearance CLOUDY (A) CLEAR   Specific Gravity, Urine 1.025 1.005 - 1.030   pH 5.0 5.0 - 8.0   Glucose, UA (A) NEGATIVE mg/dL    TEST NOT REPORTED DUE TO COLOR INTERFERENCE OF URINE PIGMENT   Hgb urine dipstick (A) NEGATIVE    TEST NOT REPORTED DUE TO COLOR INTERFERENCE OF URINE PIGMENT   Bilirubin Urine (A) NEGATIVE    TEST NOT REPORTED DUE TO COLOR INTERFERENCE OF URINE PIGMENT   Ketones, ur NEGATIVE NEGATIVE mg/dL   Protein, ur (A) NEGATIVE mg/dL    TEST NOT REPORTED  DUE TO COLOR INTERFERENCE OF URINE PIGMENT   Nitrite (A) NEGATIVE    TEST NOT REPORTED DUE TO COLOR INTERFERENCE OF URINE PIGMENT   Leukocytes, UA (A) NEGATIVE    TEST NOT REPORTED DUE TO COLOR INTERFERENCE OF URINE PIGMENT  Urinalysis, Microscopic (reflex)     Status: Abnormal   Collection Time: 06/05/16  2:40 PM  Result Value Ref Range   RBC / HPF  0 - 5 RBC/hpf    TEST NOT REPORTED DUE TO COLOR INTERFERENCE OF URINE PIGMENT   WBC, UA  0 - 5 WBC/hpf    TEST NOT REPORTED DUE TO COLOR INTERFERENCE OF URINE PIGMENT   Bacteria, UA (A) NONE SEEN    TEST NOT REPORTED DUE TO COLOR INTERFERENCE OF URINE PIGMENT   Squamous Epithelial / LPF (A) NONE SEEN    TEST NOT REPORTED DUE TO COLOR INTERFERENCE OF URINE PIGMENT  Pregnancy, urine POC     Status: None   Collection Time: 06/05/16  2:51 PM  Result Value Ref Range   Preg Test, Ur NEGATIVE NEGATIVE  CBC     Status: Abnormal   Collection Time: 06/05/16  3:26 PM  Result Value Ref Range   WBC 5.9 4.0 - 10.5 K/uL   RBC 5.20 (H) 3.87 - 5.11 MIL/uL   Hemoglobin 13.6 12.0 - 15.0 g/dL   HCT 41.5 36.0 - 46.0 %   MCV 79.8 78.0 - 100.0 fL   MCH 26.2 26.0 - 34.0 pg   MCHC 32.8 30.0 - 36.0 g/dL   RDW 13.3 11.5 - 15.5 %   Platelets 221 150 - 400 K/uL  Basic metabolic panel     Status: None   Collection Time: 06/05/16  3:26 PM  Result Value Ref Range   Sodium 138 135 - 145 mmol/L   Potassium 3.7 3.5 - 5.1 mmol/L   Chloride 107 101 - 111 mmol/L    CO2 23 22 - 32 mmol/L   Glucose, Bld 99 65 - 99 mg/dL   BUN 8 6 - 20 mg/dL   Creatinine, Ser 0.59 0.44 - 1.00 mg/dL   Calcium 9.2 8.9 - 10.3 mg/dL   GFR calc non Af Amer >60 >60 mL/min   GFR calc Af Amer >60 >60 mL/min   Anion gap 8 5 - 15  Wet prep, genital     Status: None   Collection Time: 06/05/16  4:00 PM  Result Value Ref Range   Yeast Wet Prep HPF POC NONE SEEN NONE SEEN   Trich, Wet Prep NONE SEEN NONE SEEN   Clue Cells Wet Prep HPF POC NONE SEEN NONE SEEN   WBC, Wet Prep HPF POC NONE SEEN NONE SEEN   Sperm NONE SEEN   Urinalysis, Routine w reflex microscopic     Status: Abnormal   Collection Time: 06/05/16  4:00 PM  Result Value Ref Range   Color, Urine YELLOW YELLOW   APPearance CLEAR CLEAR   Specific Gravity, Urine 1.028 1.005 - 1.030   pH 5.0 5.0 - 8.0   Glucose, UA NEGATIVE NEGATIVE mg/dL   Hgb urine dipstick LARGE (A) NEGATIVE   Bilirubin Urine NEGATIVE NEGATIVE   Ketones, ur NEGATIVE NEGATIVE mg/dL   Protein, ur 100 (A) NEGATIVE mg/dL   Nitrite NEGATIVE NEGATIVE   Leukocytes, UA NEGATIVE NEGATIVE   RBC / HPF TOO NUMEROUS TO COUNT 0 - 5 RBC/hpf   WBC, UA 0-5 0 - 5 WBC/hpf   Bacteria, UA NONE SEEN NONE SEEN   Squamous Epithelial /  LPF 0-5 (A) NONE SEEN   Mucous PRESENT    GC/CT, HIV sent  MDM: Acute abnormal bleeding episode with moderatrlly heavy bleeding but normal CBC, hemodynamically stable and short duration. May represent abnormal menses since not chronic. Will discharge with bleeding precautions and follow up next week with Dr. Jodi Mourning.   Assessment and Plan  Abnormal menses  Allergies as of 06/05/2016   No Known Allergies     Medication List    STOP taking these medications   butalbital-acetaminophen-caffeine 50-325-40 MG tablet Commonly known as:  FIORICET, ESGIC   dicyclomine 20 MG tablet Commonly known as:  BENTYL   famotidine 40 MG tablet Commonly known as:  PEPCID   ibuprofen 800 MG tablet Commonly known as:  ADVIL,MOTRIN    metroNIDAZOLE 500 MG tablet Commonly known as:  FLAGYL   ondansetron 4 MG tablet Commonly known as:  ZOFRAN   PRENATE STAR 20-1 MG Tabs   Simethicone 125 MG Caps   tinidazole 500 MG tablet Commonly known as:  TINDAMAX     TAKE these medications   loratadine 10 MG tablet Commonly known as:  CLARITIN Take 10 mg by mouth daily.   metFORMIN 1000 MG tablet Commonly known as:  GLUCOPHAGE Take 1 tablet (1,000 mg total) by mouth 2 (two) times daily with a meal.   OLANZapine 5 MG tablet Commonly known as:  ZYPREXA Take 1 tablet (5 mg total) by mouth at bedtime. For mood control   sertraline 50 MG tablet Commonly known as:  ZOLOFT Take 1 tablet (50 mg total) by mouth daily. For depression      Follow-up Information    HARPER,CHARLES A, MD. Schedule an appointment as soon as possible for a visit in 1 week(s).   Specialty:  Obstetrics and Gynecology Contact information: [address] [place] [address] [place] 06/05/2016, 3:22 PM

## 2016-06-05 NOTE — MAU Note (Addendum)
Onset of heavy bleeding and passing clots (golf ball size), LMP 05/19/16, cramping, has changed her pad 2 times since this started.

## 2016-06-05 NOTE — Discharge Instructions (Signed)
Safe Sex Practicing safe sex means taking steps before and during sex to reduce your risk of:  Getting an STD (sexually transmitted disease).  Giving your partner an STD.  Unwanted pregnancy. How can I practice safe sex?   To practice safe sex:  Limit your sexual partners to only one partner who is having sex with only you.  Avoid using alcohol and recreational drugs before having sex. These substances can affect your judgment.  Before having sex with a new partner:  Talk to your partner about past partners, past STDs, and drug use.  You and your partner should be screened for STDs and discuss the results with each other.  Check your body regularly for sores, blisters, rashes, or unusual discharge. If you notice any of these problems, visit your health care provider.  If you have symptoms of an infection or you are being treated for an STD, avoid sexual contact.  While having sex, use a condom. Make sure to:  Use a condom every time you have vaginal, oral, or anal sex. Both females and males should wear condoms during oral sex.  Keep condoms in place from the beginning to the end of sexual activity.  Use a latex condom, if possible. Latex condoms offer the best protection.  Use only water-based lubricants or oils to lubricate a condom. Using petroleum-based lubricants or oils will weaken the condom and increase the chance that it will break.  See your health care provider for regular screenings, exams, and tests for STDs.  Talk with your health care provider about the form of birth control (contraception) that is best for you.  Get vaccinated against hepatitis B and human papillomavirus (HPV).  If you are at risk of being infected with HIV (human immunodeficiency virus), talk with your health care provider about taking a prescription medicine to prevent HIV infection. You are considered at risk for HIV if:  You are a man who has sex with other men.  You are a  heterosexual man or woman who is sexually active with more than one partner.  You take drugs by injection.  You are sexually active with a partner who has HIV. This information is not intended to replace advice given to you by your health care provider. Make sure you discuss any questions you have with your health care provider. Document Released: 04/28/2004 Document Revised: 08/05/2015 Document Reviewed: 02/08/2015 Elsevier Interactive Patient Education  2017 [employer] Abnormal Uterine Bleeding Abnormal uterine bleeding means bleeding more than usual from your uterus. It can include:  Bleeding between periods.  Bleeding after sex.  Bleeding that is heavier than normal.  Periods that last longer than usual.  Bleeding after you have stopped having your period (menopause). There are many problems that may cause this. You should see a doctor for any kind of bleeding that is not normal. Treatment depends on the cause of the bleeding. Follow these instructions at home:  Watch your condition for any changes.  Do not use tampons, douche, or have sex, if your doctor tells you not to.  Change your pads often.  Get regular well-woman exams. Make sure they include a pelvic exam and cervical cancer screening.  Keep all follow-up visits as told by your doctor. This is important. Contact a doctor if:  The bleeding lasts more than one week.  You feel dizzy at times.  You feel like you are going to throw up (nauseous).  You throw up. Get help right away if:  You pass  out.  You have to change pads every hour.  You have belly (abdominal) pain.  You have a fever.  You get sweaty.  You get weak.  You passing large blood clots from your vagina. Summary  Abnormal uterine bleeding means bleeding more than usual from your uterus.  There are many problems that may cause this. You should see a doctor for any kind of bleeding that is not normal.  Treatment depends on the cause  of the bleeding. This information is not intended to replace advice given to you by your health care provider. Make sure you discuss any questions you have with your health care provider. Document Released: 01/16/2009 Document Revised: 03/15/2016 Document Reviewed: 03/15/2016 Elsevier Interactive Patient Education  2017 [employer].

## 2016-06-06 LAB — GC/CHLAMYDIA PROBE AMP (~~LOC~~) NOT AT ARMC
CHLAMYDIA, DNA PROBE: NEGATIVE
Neisseria Gonorrhea: NEGATIVE

## 2016-06-06 LAB — HIV ANTIBODY (ROUTINE TESTING W REFLEX): HIV SCREEN 4TH GENERATION: NONREACTIVE

## 2016-06-07 ENCOUNTER — Encounter: Payer: Self-pay | Admitting: Obstetrics

## 2016-06-07 ENCOUNTER — Ambulatory Visit (INDEPENDENT_AMBULATORY_CARE_PROVIDER_SITE_OTHER): Payer: Medicaid Other | Admitting: Obstetrics

## 2016-06-07 VITALS — BP 122/84 | HR 80 | Ht 62.0 in | Wt 154.0 lb

## 2016-06-07 DIAGNOSIS — N939 Abnormal uterine and vaginal bleeding, unspecified: Secondary | ICD-10-CM

## 2016-06-07 DIAGNOSIS — Z3040 Encounter for surveillance of contraceptives, unspecified: Secondary | ICD-10-CM | POA: Diagnosis not present

## 2016-06-07 DIAGNOSIS — Z3042 Encounter for surveillance of injectable contraceptive: Secondary | ICD-10-CM

## 2016-06-07 DIAGNOSIS — K5900 Constipation, unspecified: Secondary | ICD-10-CM | POA: Diagnosis not present

## 2016-06-07 DIAGNOSIS — R140 Abdominal distension (gaseous): Secondary | ICD-10-CM

## 2016-06-07 MED ORDER — MEDROXYPROGESTERONE ACETATE 150 MG/ML IM SUSP: 150.0000 mg | INTRAMUSCULAR | 4 refills | Status: DC

## 2016-06-07 NOTE — Progress Notes (Signed)
Pt c/o abdominal swelling and pain.

## 2016-06-07 NOTE — Progress Notes (Signed)
Patient ID: Stephanie Mack, female   DOB: [birth year], 26 y.o.   MRN: [id number]  Chief Complaint  Patient presents with  . stomach swelling     HPI Stephanie Mack is a 26 y.o. female.  Presents for abdominal swelling and pain.  Has been seen by Gastroenterology ( Dr. Carlean Purl ) for chronic constipation but did not take meds nor did she follow up.  Also has heavy periods.  Seen in MAU at [facility] 2 days ago for AUB and had negative work up. HPI  Past Medical History:  Diagnosis Date  . ADHD (attention deficit hyperactivity disorder)   . ADHD (attention deficit hyperactivity disorder)   . Anemia   . Anxiety   . Bipolar 1 disorder ([place])   . Bipolar disorder ([place])   . Chlamydia 05-31-10  . Depression    depression  . GERD (gastroesophageal reflux disease)    with pregnancy  . Gestational diabetes    current pregnancy  . Gonorrhea contact, treated   . Headache   . Infection    UTI  . Pregnancy induced hypertension    previous pregnancy  . Pseudocyesis 2013   Seen in MAU for percieved FM, abd distension. Normal exam.   . Sickle cell trait ([place])     Past Surgical History:  Procedure Laterality Date  . CESAREAN SECTION N/A 09/01/2012   Procedure:  Primary cesarean section with delivery of baby girl at 46.  Apgars 1/1.  ;  Surgeon: Frederico Hamman, MD;  Location: [facility];  Service: Obstetrics;  Laterality: N/A;  . CESAREAN SECTION N/A 01/07/2015   Procedure: REPEAT CESAREAN SECTION;  Surgeon: Shelly Bombard, MD;  Location: [facility];  Service: Obstetrics;  Laterality: N/A;  . NASAL SEPTUM SURGERY    . WISDOM TOOTH EXTRACTION      Family History  Problem Relation Age of Onset  . Kidney disease Mother   . Hypertension Father   . Cancer Sister   . Asthma Brother   . Diabetes Maternal Grandfather   . Heart disease Neg Hx     Social History Social History  Substance Use Topics  . Smoking status: Never Smoker  . Smokeless tobacco: Never Used  . Alcohol use No    No Known  Allergies  Current Outpatient Prescriptions  Medication Sig Dispense Refill  . metFORMIN (GLUCOPHAGE) 1000 MG tablet Take 1 tablet (1,000 mg total) by mouth 2 (two) times daily with a meal. 180 tablet 1  . OLANZapine (ZYPREXA) 5 MG tablet Take 1 tablet (5 mg total) by mouth at bedtime. For mood control 30 tablet 0  . sertraline (ZOLOFT) 50 MG tablet Take 1 tablet (50 mg total) by mouth daily. For depression 30 tablet 0  . loratadine (CLARITIN) 10 MG tablet Take 10 mg by mouth daily.  11  . medroxyPROGESTERone (DEPO-PROVERA) 150 MG/ML injection Inject 1 mL (150 mg total) into the muscle every 3 (three) months. 1 mL 4   No current facility-administered medications for this visit.     Review of Systems Review of Systems Constitutional: negative for fatigue and weight loss Respiratory: negative for cough and wheezing Cardiovascular: negative for chest pain, fatigue and palpitations Gastrointestinal: positive for abdominal pain and change in bowel habits Genitourinary:positive for heavy periods Integument/breast: negative for nipple discharge Musculoskeletal:negative for myalgias Neurological: negative for gait problems and tremors Behavioral/Psych: positive for Bipolar Disorder Endocrine: negative for temperature intolerance      Blood pressure 122/84, pulse 80, height 5\' 2"  (1.575 m), weight  154 lb (69.9 kg), last menstrual period 05/19/2016, not currently breastfeeding.  Physical Exam Physical Exam General:   alert  Skin:   no rash or abnormalities  Lungs:   clear to auscultation bilaterally  Heart:   regular rate and rhythm, S1, S2 normal, no murmur, click, rub or gallop  Breasts:   normal without suspicious masses, skin or nipple changes or axillary nodes  Abdomen:  normal findings: no organomegaly, soft, non-tender and no hernia  Pelvis:  External genitalia: normal general appearance Urinary system: urethral meatus normal and bladder without fullness, nontender Vaginal: normal  without tenderness, induration or masses.  Menstrual blood in vault  Cervix: normal appearance Adnexa: normal bimanual exam Uterus: anteverted and non-tender, normal size    50% of 15 min visit spent on counseling and coordination of care.    Data Reviewed Labs Office notes   Assessment     Chronic constipation.  Poor compliance with meds and instructions. AUB - Hormonal Imbalance Contraceptive Counseling and Advice.  Wants to start Depo Provera Injections     Plan    Referred back to Gastroenterology for further management of chronic constipation Depo Provera Rx for contraception and AUB F/U prn  Orders Placed This Encounter  Procedures  . Ambulatory referral to Gastroenterology    Referral Priority:   Routine    Referral Type:   Consultation    Referral Reason:   Specialty Services Required    Number of Visits Requested:   1   Meds ordered this encounter  Medications  . medroxyPROGESTERone (DEPO-PROVERA) 150 MG/ML injection    Sig: Inject 1 mL (150 mg total) into the muscle every 3 (three) months.    Dispense:  1 mL    Refill:  4

## 2016-06-08 ENCOUNTER — Ambulatory Visit (INDEPENDENT_AMBULATORY_CARE_PROVIDER_SITE_OTHER): Payer: Medicaid Other

## 2016-06-08 ENCOUNTER — Ambulatory Visit: Payer: Self-pay

## 2016-06-08 VITALS — BP 132/74 | HR 77 | Wt 154.5 lb

## 2016-06-08 DIAGNOSIS — Z3202 Encounter for pregnancy test, result negative: Secondary | ICD-10-CM | POA: Diagnosis not present

## 2016-06-08 DIAGNOSIS — Z30013 Encounter for initial prescription of injectable contraceptive: Secondary | ICD-10-CM

## 2016-06-08 DIAGNOSIS — Z3042 Encounter for surveillance of injectable contraceptive: Secondary | ICD-10-CM

## 2016-06-08 LAB — POCT URINE PREGNANCY: Preg Test, Ur: NEGATIVE

## 2016-06-08 MED ORDER — MEDROXYPROGESTERONE ACETATE 150 MG/ML IM SUSP
150.0000 mg | Freq: Once | INTRAMUSCULAR | Status: AC
  Administered 2016-06-08: 150 mg via INTRAMUSCULAR

## 2016-06-08 NOTE — Progress Notes (Signed)
Patient presents for DEPO Injection. UPT-NEGATIVE. Shot given in [place]. Administrations This Visit    medroxyPROGESTERone (DEPO-PROVERA) injection 150 mg    Admin Date 06/08/2016 Action Given Dose 150 mg Route Intramuscular Administered By Tamela Oddi, RMA         .

## 2016-06-09 ENCOUNTER — Telehealth: Payer: Self-pay

## 2016-06-09 NOTE — Telephone Encounter (Signed)
error 

## 2016-06-10 ENCOUNTER — Ambulatory Visit: Payer: Self-pay | Admitting: Internal Medicine

## 2016-07-05 ENCOUNTER — Ambulatory Visit: Payer: Self-pay | Admitting: Obstetrics

## 2016-07-13 ENCOUNTER — Other Ambulatory Visit: Payer: Self-pay | Admitting: Obstetrics

## 2016-07-13 DIAGNOSIS — Z0000 Encounter for general adult medical examination without abnormal findings: Secondary | ICD-10-CM

## 2016-07-13 DIAGNOSIS — J301 Allergic rhinitis due to pollen: Secondary | ICD-10-CM

## 2016-07-13 DIAGNOSIS — N946 Dysmenorrhea, unspecified: Secondary | ICD-10-CM

## 2016-07-13 DIAGNOSIS — R12 Heartburn: Secondary | ICD-10-CM

## 2016-07-13 NOTE — Telephone Encounter (Signed)
Please send refills if approved.

## 2016-07-28 ENCOUNTER — Ambulatory Visit: Payer: Self-pay | Admitting: Obstetrics

## 2016-08-04 ENCOUNTER — Ambulatory Visit: Payer: Self-pay | Admitting: Obstetrics

## 2016-08-10 ENCOUNTER — Encounter: Payer: Self-pay | Admitting: Family Medicine

## 2016-08-10 ENCOUNTER — Ambulatory Visit (INDEPENDENT_AMBULATORY_CARE_PROVIDER_SITE_OTHER): Payer: Medicaid Other | Admitting: Family Medicine

## 2016-08-10 ENCOUNTER — Telehealth: Payer: Self-pay | Admitting: *Deleted

## 2016-08-10 VITALS — BP 124/64 | HR 91 | Temp 98.0°F | Resp 17 | Ht 61.0 in | Wt 148.0 lb

## 2016-08-10 DIAGNOSIS — E118 Type 2 diabetes mellitus with unspecified complications: Secondary | ICD-10-CM

## 2016-08-10 DIAGNOSIS — Z23 Encounter for immunization: Secondary | ICD-10-CM | POA: Diagnosis not present

## 2016-08-10 DIAGNOSIS — R635 Abnormal weight gain: Secondary | ICD-10-CM

## 2016-08-10 LAB — POCT URINALYSIS DIP (DEVICE)
Glucose, UA: 100 mg/dL — AB
Hgb urine dipstick: NEGATIVE
NITRITE: POSITIVE — AB
Protein, ur: >=300 mg/dL — AB
Specific Gravity, Urine: 1.025 (ref 1.005–1.030)
Urobilinogen, UA: 2 mg/dL — ABNORMAL HIGH (ref 0.0–1.0)
pH: 5 (ref 5.0–8.0)

## 2016-08-10 LAB — COMPLETE METABOLIC PANEL WITH GFR
ALBUMIN: 4.4 g/dL (ref 3.6–5.1)
ALT: 19 U/L (ref 6–29)
AST: 15 U/L (ref 10–30)
Alkaline Phosphatase: 59 U/L (ref 33–115)
BUN: 9 mg/dL (ref 7–25)
CO2: 19 mmol/L — AB (ref 20–31)
Calcium: 9.6 mg/dL (ref 8.6–10.2)
Chloride: 106 mmol/L (ref 98–110)
Creat: 0.74 mg/dL (ref 0.50–1.10)
GFR, Est African American: >89 mL/min (ref >=60)
GLUCOSE: 139 mg/dL — AB (ref 65–99)
POTASSIUM: 4 mmol/L (ref 3.5–5.3)
SODIUM: 140 mmol/L (ref 135–146)
Total Bilirubin: 0.7 mg/dL (ref 0.2–1.2)
Total Protein: 7.6 g/dL (ref 6.1–8.1)

## 2016-08-10 LAB — POCT GLYCOSYLATED HEMOGLOBIN (HGB A1C): Hemoglobin A1C: 6.7

## 2016-08-10 LAB — TSH: TSH: 2.83 m[IU]/L

## 2016-08-10 MED ORDER — METFORMIN HCL 1000 MG PO TABS: 1000.0000 mg | ORAL_TABLET | Freq: Two times a day (BID) | ORAL | 1 refills | Status: DC

## 2016-08-10 NOTE — Progress Notes (Signed)
Subjective:    Patient ID: Stephanie Mack, female    DOB: [birth year], 26 y.o.   MRN: [id number]  Ms. Jamacia Jester, a 26 year old female with a history of bipolar disorder, ADHD, and diabetes presents for follow up. Stephanie Mack is followed by Beverly Sessions monthly for ADHD and bipolar depression.   She has a history of diabetes. Diabetes has been controlled by Metformin. She says that she has been taking medications consistently. She endorses weight gain. She attributes weight gain to sarting Depo provera for birth control.    Diabetes  She presents for her follow-up diabetic visit. She has type 2 diabetes mellitus. Her disease course has been improving. Pertinent negatives for diabetes include no blurred vision, no chest pain, no fatigue, no foot paresthesias, no foot ulcerations, no polydipsia, no polyphagia, no polyuria, no visual change, no weakness and no weight loss. Risk factors for coronary artery disease include sedentary lifestyle. She does not see a podiatrist.Eye exam is not current.    Past Medical History:  Diagnosis Date  . ADHD (attention deficit hyperactivity disorder)   . Anemia   . Anxiety   . Bipolar 1 disorder ([place])   . Chlamydia 05-31-10  . Chronic constipation   . Depression    depression  . GERD (gastroesophageal reflux disease)    with pregnancy  . Gestational diabetes    current pregnancy  . Gonorrhea contact, treated   . Headache   . Infection    UTI  . Pregnancy induced hypertension    previous pregnancy  . Pseudocyesis 2013   Seen in [facility] for percieved FM, abd distension. Normal exam.   . Sickle cell trait ([place])    Review of Systems  Constitutional: Positive for unexpected weight change (weight gain). Negative for fatigue, fever and weight loss.  HENT: Negative.   Eyes: Negative.  Negative for blurred vision.  Respiratory: Negative.   Cardiovascular: Negative for chest pain, palpitations and leg swelling.  Gastrointestinal: Negative.   Endocrine:  Negative.  Negative for polydipsia, polyphagia and polyuria.  Genitourinary: Negative.  Difficulty urinating:              Neurological: Negative for weakness.  Hematological: Negative.   Psychiatric/Behavioral: Negative.  Negative for behavioral problems and suicidal ideas.       Objective:   Physical Exam  Constitutional: She is oriented to person, place, and time. She appears well-developed and well-nourished.  HENT:  Head: Normocephalic and atraumatic.  Right Ear: External ear normal.  Left Ear: External ear normal.  Nose: Nose normal.  Eyes: Conjunctivae and EOM are normal. Pupils are equal, round, and reactive to light.  Neck: Normal range of motion. Neck supple.  Cardiovascular: Normal rate, regular rhythm, normal heart sounds and intact distal pulses.   Pulmonary/Chest: Effort normal and breath sounds normal.  Abdominal: Soft. Bowel sounds are normal. There is no tenderness.  Musculoskeletal: Normal range of motion.  Neurological: She is alert and oriented to person, place, and time. She has normal reflexes.  Skin: Skin is warm and dry.  Psychiatric: She has a normal mood and affect. Her behavior is normal. Judgment and thought content normal.     BP 124/64 (BP Location: Left Arm, Patient Position: Sitting, Cuff Size: Normal)   Pulse 91   Temp 98 F (36.7 C) (Oral)   Resp 17   Ht 5\' 1"  (1.549 m)   Wt 148 lb (67.1 kg)   LMP  (LMP Unknown) Comment: on dep injection   SpO2 100%  BMI 27.96 kg/m   Assessment & Plan:  1. Type 2 diabetes mellitus with complication, without long-term current use of insulin (HCC) Hemoglobin a1C is 6.7; will continue Metformin as previously prescribed.   - HgB A1c - COMPLETE METABOLIC PANEL WITH GFR - metFORMIN (GLUCOPHAGE) 1000 MG tablet; Take 1 tablet (1,000 mg total) by mouth 2 (two) times daily with a meal.  Dispense: 180 tablet; Refill: 1 - Urine culture - Microalbumin/Creatinine Ratio, Urine - POCT urinalysis dip (device)  2.  Weight gain Recommend a lowfat, low carbohydrate diet divided over 5-6 small meals, increase water intake to 6-8 glasses, and 150 minutes per week of cardiovascular exercise.   - TSH - Urine culture - Microalbumin/Creatinine Ratio, Urine  3. Immunization due - Pneumococcal polysaccharide vaccine 23-valent greater than or equal to 2yo subcutaneous/IM    RTC: 6 months for DMII   Pinchas Reither Al Decant  MSN, FNP-C [facility] [place], [place] [id number] [phone]

## 2016-08-10 NOTE — Patient Instructions (Addendum)
Continue Metformin 1000 mg twice daily Recommend a lowfat, low carbohydrate diet divided over 5-6 small meals, increase water intake to 6-8 glasses, and 150 minutes per week of cardiovascular exercise.   Diabetes Mellitus and Exercise Exercising regularly is important for your overall health, especially when you have diabetes (diabetes mellitus). Exercising is not only about losing weight. It has many health benefits, such as increasing muscle strength and bone density and reducing body fat and stress. This leads to improved fitness, flexibility, and endurance, all of which result in better overall health. Exercise has additional benefits for people with diabetes, including:  Reducing appetite.  Helping to lower and control blood glucose.  Lowering blood pressure.  Helping to control amounts of fatty substances (lipids) in the blood, such as cholesterol and triglycerides.  Helping the body to respond better to insulin (improving insulin sensitivity).  Reducing how much insulin the body needs.  Decreasing the risk for heart disease by:  Lowering cholesterol and triglyceride levels.  Increasing the levels of good cholesterol.  Lowering blood glucose levels. What is my activity plan? Your health care provider or certified diabetes educator can help you make a plan for the type and frequency of exercise (activity plan) that works for you. Make sure that you:  Do at least 150 minutes of moderate-intensity or vigorous-intensity exercise each week. This could be brisk walking, biking, or water aerobics.  Do stretching and strength exercises, such as yoga or weightlifting, at least 2 times a week.  Spread out your activity over at least 3 days of the week.  Get some form of physical activity every day.  Do not go more than 2 days in a row without some kind of physical activity.  Avoid being inactive for more than 90 minutes at a time. Take frequent breaks to walk or stretch.  Choose  a type of exercise or activity that you enjoy, and set realistic goals.  Start slowly, and gradually increase the intensity of your exercise over time. What do I need to know about managing my diabetes?  Check your blood glucose before and after exercising.  If your blood glucose is higher than 240 mg/dL (13.3 mmol/L) before you exercise, check your urine for ketones. If you have ketones in your urine, do not exercise until your blood glucose returns to normal.  Know the symptoms of low blood glucose (hypoglycemia) and how to treat it. Your risk for hypoglycemia increases during and after exercise. Common symptoms of hypoglycemia can include:  Hunger.  Anxiety.  Sweating and feeling clammy.  Confusion.  Dizziness or feeling light-headed.  Increased heart rate or palpitations.  Blurry vision.  Tingling or numbness around the mouth, lips, or tongue.  Tremors or shakes.  Irritability.  Keep a rapid-acting carbohydrate snack available before, during, and after exercise to help prevent or treat hypoglycemia.  Avoid injecting insulin into areas of the body that are going to be exercised. For example, avoid injecting insulin into:  The arms, when playing tennis.  The legs, when jogging.  Keep records of your exercise habits. Doing this can help you and your health care provider adjust your diabetes management plan as needed. Write down:  Food that you eat before and after you exercise.  Blood glucose levels before and after you exercise.  The type and amount of exercise you have done.  When your insulin is expected to peak, if you use insulin. Avoid exercising at times when your insulin is peaking.  When you start a  new exercise or activity, work with your health care provider to make sure the activity is safe for you, and to adjust your insulin, medicines, or food intake as needed.  Drink plenty of water while you exercise to prevent dehydration or heat stroke. Drink  enough fluid to keep your urine clear or pale yellow. This information is not intended to replace advice given to you by your health care provider. Make sure you discuss any questions you have with your health care provider. Document Released: 06/11/2003 Document Revised: 10/09/2015 Document Reviewed: 08/31/2015 Elsevier Interactive Patient Education  2017 [employer].  Diabetes Mellitus and Skin Care Diabetes (diabetes mellitus) can lead to health problems over time, including skin problems. People with diabetes have a higher risk for many types of skin complications. This is because having poorly controlled blood sugar (glucose) levels can:  Damage nerves and blood vessels. This can result in decreased feeling in your legs and feet, which means you may not notice minor skin injuries that could lead to serious problems.  Reduce blood flow (circulation), which makes wounds heal more slowly and increases your risk of infection.  Cause areas of skin to become thick or discolored. What are some common skin conditions that affect people with diabetes? Diabetes often causes dry skin. It can also cause the skin on the feet to get thinner, break more easily, and heal more slowly. There are certain skin conditions that commonly affect people who have diabetes, such as:  Bacterial skin infections, such as styes, boils, infected hair follicles, and infections of the skin around the nails.  Fungal skin infections. These are most common in areas where skin rubs together, such as in the armpits or under the breasts.  Open sores, especially on the feet.  Tissue death (gangrene). This can happen on your feet if a serious infection does not heal properly. Gangrene can cause the need for a foot or leg to be surgically removed (amputated). Diabetes can also cause the skin to change. You may develop:  Dark, velvety markings on the skin that usually appear on the face, neck, armpits, inner thighs, and groin  (acanthosis nigricans). This typically affects people of African-American and American-Indian descent.  Red, raised, scar-like tissue that may itch, feel painful, or develop into a wound (necrobiosis lipoidica).  Blisters on feet, toes, hands, or fingers.  Thickened, wax-like areas of skin that usually occur on the hands, forehead, or toes (digital sclerosis).  Brown or red ring-shaped or half-ring-shaped patches of skin on the ears or fingers (disseminated granuloma).  Pea-shaped yellow bumps that may be itchy and surrounded by a red ring (eruptive xanthomatosis). This usually affects the arms, feet, buttocks, and the top of the hands.  Round, discolored patches of tan skin that do not hurt or itch (diabetic dermopathy). These may look like age spots. What do I need to know about itchy skin? It is common for people with diabetes to have itchy skin caused by dryness. Frequent high blood glucose levels can cause itchiness, and poor circulation and certain skin infections can make dry, itchy skin worse. If you have itchy skin that is red or covered in a rash, this could be a sign of an allergic reaction to a medicine. If you have a rash or if your skin is very itchy, contact your health care provider. You may need help to manage your diabetes better, or you may need treatment for an infection. How can I prevent skin breakdown? When you have diabetes and  you get a badly infected ulcer or sore that does not heal, your skin can break down, especially if you have poor circulation or are on bed rest. To prevent skin breakdown:  Keep your skin clean and dry. Wash your skin often. Do not use hot water.  Do not use any products that contain nicotine or tobacco, such as cigarettes and e-cigarettes. Smoking affects the body's ability to heal. If you need help quitting, ask your health care provider.  Check your skin every day for cuts, bruises, redness, blisters, or sores, especially on your feet. Tell  your health care provider about any cuts, wounds, or sores you have, especially if they are healing slowly.  If you are on bed rest, try to change positions often. What else do I need to know about taking care of my skin?   To relieve dry skin and itching:  Limit baths and showers to 5-10 minutes.  Bathe with lukewarm water instead of hot water.  Use mild soap and gentle skin cleansers. Do not use soap that is perfumed or harsh or dries your skin.  Put on lotion as soon as you finish bathing.  Make sure that your health care provider performs a visual foot exam at every medical visit.  Schedule a foot exam with your health care provider once every year. This exam includes an inspection of the structure and skin of your feet.  If you get a skin injury, such as a cut, blister, or sore, check the area every day for signs of infection. Check for:  More redness, swelling, or pain.  More fluid or blood.  Warmth.  Pus or a bad smell. Contact a health care provider if:  You develop a cut or sore, especially on your feet.  You develop signs of infection after a skin injury.  Your blood glucose level is higher than 240 mg/dL (13.3 mmol/L) for 2 days in a row.  You have itchy skin that develops redness or a rash.  You have discolored areas of skin.  You have areas where your skin is changing, such as thickening or appearing shiny. This information is not intended to replace advice given to you by your health care provider. Make sure you discuss any questions you have with your health care provider. Document Released: 09/01/2015 Document Revised: 10/09/2015 Document Reviewed: 09/01/2015 Elsevier Interactive Patient Education  2017 [employer].

## 2016-08-10 NOTE — Telephone Encounter (Signed)
Fax from pharmacy for refill on PNV- Prenate Mini.    Please send Rx if approved with refills.

## 2016-08-11 ENCOUNTER — Other Ambulatory Visit: Payer: Self-pay | Admitting: Obstetrics

## 2016-08-11 DIAGNOSIS — Z0000 Encounter for general adult medical examination without abnormal findings: Secondary | ICD-10-CM

## 2016-08-11 LAB — URINE CULTURE: Organism ID, Bacteria: NO GROWTH

## 2016-08-11 LAB — MICROALBUMIN / CREATININE URINE RATIO
Creatinine, Urine: 380 mg/dL — ABNORMAL HIGH (ref 20–320)
Microalb Creat Ratio: 84 mcg/mg creat — ABNORMAL HIGH (ref <30)
Microalb, Ur: 32.1 mg/dL

## 2016-08-11 MED ORDER — PRENATE MINI 29-0.6-0.4-350 MG PO CAPS: 1.0000 | ORAL_CAPSULE | Freq: Every day | ORAL | 3 refills | Status: DC

## 2016-08-11 NOTE — Telephone Encounter (Signed)
Prenate Mini Rx

## 2016-08-24 ENCOUNTER — Emergency Department (HOSPITAL_COMMUNITY)
Admission: EM | Admit: 2016-08-24 | Discharge: 2016-08-25 | Disposition: A | Discharge status: Other Acute Inpatient Hospital | Payer: Medicaid Other | Attending: Emergency Medicine | Admitting: Emergency Medicine

## 2016-08-24 ENCOUNTER — Encounter (HOSPITAL_COMMUNITY): Payer: Self-pay

## 2016-08-24 DIAGNOSIS — F314 Bipolar disorder, current episode depressed, severe, without psychotic features: Secondary | ICD-10-CM | POA: Insufficient documentation

## 2016-08-24 DIAGNOSIS — Z7984 Long term (current) use of oral hypoglycemic drugs: Secondary | ICD-10-CM | POA: Insufficient documentation

## 2016-08-24 DIAGNOSIS — Z79899 Other long term (current) drug therapy: Secondary | ICD-10-CM | POA: Insufficient documentation

## 2016-08-24 LAB — RAPID URINE DRUG SCREEN, HOSP PERFORMED
Amphetamines: NOT DETECTED
Barbiturates: NOT DETECTED
Benzodiazepines: NOT DETECTED
COCAINE: NOT DETECTED
OPIATES: NOT DETECTED
Tetrahydrocannabinol: NOT DETECTED

## 2016-08-24 LAB — COMPREHENSIVE METABOLIC PANEL
ALK PHOS: 65 U/L (ref 38–126)
ALT: 19 U/L (ref 14–54)
ANION GAP: 9 (ref 5–15)
AST: 18 U/L (ref 15–41)
Albumin: 4.2 g/dL (ref 3.5–5.0)
BUN: 8 mg/dL (ref 6–20)
CALCIUM: 9.3 mg/dL (ref 8.9–10.3)
CO2: 23 mmol/L (ref 22–32)
CREATININE: 0.66 mg/dL (ref 0.44–1.00)
Chloride: 106 mmol/L (ref 101–111)
GFR calc Af Amer: >60 mL/min (ref >60)
Glucose, Bld: 116 mg/dL — ABNORMAL HIGH (ref 65–99)
Potassium: 3.4 mmol/L — ABNORMAL LOW (ref 3.5–5.1)
SODIUM: 138 mmol/L (ref 135–145)
Total Bilirubin: 0.5 mg/dL (ref 0.3–1.2)
Total Protein: 8.3 g/dL — ABNORMAL HIGH (ref 6.5–8.1)

## 2016-08-24 LAB — CBC
HCT: 40.7 % (ref 36.0–46.0)
Hemoglobin: 13.2 g/dL (ref 12.0–15.0)
MCH: 25.8 pg — AB (ref 26.0–34.0)
MCHC: 32.4 g/dL (ref 30.0–36.0)
MCV: 79.6 fL (ref 78.0–100.0)
PLATELETS: 251 10*3/uL (ref 150–400)
RBC: 5.11 MIL/uL (ref 3.87–5.11)
RDW: 13.2 % (ref 11.5–15.5)
WBC: 5.9 10*3/uL (ref 4.0–10.5)

## 2016-08-24 LAB — I-STAT BETA HCG BLOOD, ED (MC, WL, AP ONLY): I-stat hCG, quantitative: <5 m[IU]/mL (ref <5)

## 2016-08-24 LAB — ACETAMINOPHEN LEVEL

## 2016-08-24 LAB — ETHANOL

## 2016-08-24 LAB — SALICYLATE LEVEL: Salicylate Lvl: <7 mg/dL (ref 2.8–30.0)

## 2016-08-24 MED ORDER — ONDANSETRON HCL 4 MG PO TABS: 4.0000 mg | ORAL_TABLET | Freq: Three times a day (TID) | ORAL | Status: DC | PRN

## 2016-08-24 MED ORDER — METFORMIN HCL 500 MG PO TABS
1000.0000 mg | ORAL_TABLET | Freq: Two times a day (BID) | ORAL | Status: DC
  Administered 2016-08-25: 1000 mg via ORAL
  Filled 2016-08-24: qty 2

## 2016-08-24 MED ORDER — LORATADINE 10 MG PO TABS
10.0000 mg | ORAL_TABLET | Freq: Every day | ORAL | Status: DC
  Administered 2016-08-25: 10 mg via ORAL
  Filled 2016-08-24: qty 1

## 2016-08-24 MED ORDER — ZOLPIDEM TARTRATE 5 MG PO TABS: 5.0000 mg | ORAL_TABLET | Freq: Every evening | ORAL | Status: DC | PRN

## 2016-08-24 MED ORDER — IBUPROFEN 200 MG PO TABS: 600.0000 mg | ORAL_TABLET | Freq: Three times a day (TID) | ORAL | Status: DC | PRN

## 2016-08-24 MED ORDER — SERTRALINE HCL 50 MG PO TABS
50.0000 mg | ORAL_TABLET | Freq: Every day | ORAL | Status: DC
  Administered 2016-08-25: 50 mg via ORAL
  Filled 2016-08-24: qty 1

## 2016-08-24 MED ORDER — ACETAMINOPHEN 325 MG PO TABS
650.0000 mg | ORAL_TABLET | ORAL | Status: DC | PRN
Start: 2016-08-24 — End: 2016-08-25

## 2016-08-24 NOTE — ED Notes (Signed)
Pt here with [employer], grandmother is getting IVC papers [employer] states that she tried to take a handful of pills and her grandmother caught her Pt lost a child in 2014 in May and pt does this every year
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
## 2016-08-24 NOTE — ED Notes (Signed)
Patient educated about search process and term "contraband " and routine search performed. No contraband found. 

## 2016-08-24 NOTE — ED Notes (Signed)
Bed: [facility]4 Expected date:  Expected time:  Means of arrival:  Comments: 

## 2016-08-25 ENCOUNTER — Encounter (HOSPITAL_COMMUNITY): Payer: Self-pay | Admitting: *Deleted

## 2016-08-25 ENCOUNTER — Inpatient Hospital Stay (HOSPITAL_COMMUNITY)
Admission: AD | Admit: 2016-08-25 | Discharge: 2016-08-29 | DRG: 885 | Disposition: A | Discharge status: Home / Self Care | Payer: Medicaid Other | Attending: Psychiatry | Admitting: Psychiatry

## 2016-08-25 DIAGNOSIS — F319 Bipolar disorder, unspecified: Principal | ICD-10-CM | POA: Diagnosis present

## 2016-08-25 DIAGNOSIS — R45851 Suicidal ideations: Secondary | ICD-10-CM | POA: Diagnosis present

## 2016-08-25 DIAGNOSIS — Z841 Family history of disorders of kidney and ureter: Secondary | ICD-10-CM

## 2016-08-25 DIAGNOSIS — F909 Attention-deficit hyperactivity disorder, unspecified type: Secondary | ICD-10-CM | POA: Diagnosis present

## 2016-08-25 DIAGNOSIS — Z833 Family history of diabetes mellitus: Secondary | ICD-10-CM | POA: Diagnosis not present

## 2016-08-25 DIAGNOSIS — Z825 Family history of asthma and other chronic lower respiratory diseases: Secondary | ICD-10-CM

## 2016-08-25 DIAGNOSIS — R066 Hiccough: Secondary | ICD-10-CM | POA: Diagnosis present

## 2016-08-25 DIAGNOSIS — K219 Gastro-esophageal reflux disease without esophagitis: Secondary | ICD-10-CM | POA: Diagnosis present

## 2016-08-25 DIAGNOSIS — Z809 Family history of malignant neoplasm, unspecified: Secondary | ICD-10-CM

## 2016-08-25 DIAGNOSIS — Z818 Family history of other mental and behavioral disorders: Secondary | ICD-10-CM | POA: Diagnosis not present

## 2016-08-25 DIAGNOSIS — F314 Bipolar disorder, current episode depressed, severe, without psychotic features: Secondary | ICD-10-CM | POA: Diagnosis not present

## 2016-08-25 DIAGNOSIS — E118 Type 2 diabetes mellitus with unspecified complications: Secondary | ICD-10-CM | POA: Diagnosis not present

## 2016-08-25 DIAGNOSIS — Z8249 Family history of ischemic heart disease and other diseases of the circulatory system: Secondary | ICD-10-CM

## 2016-08-25 DIAGNOSIS — G4700 Insomnia, unspecified: Secondary | ICD-10-CM | POA: Diagnosis not present

## 2016-08-25 LAB — GLUCOSE, CAPILLARY: Glucose-Capillary: 96 mg/dL (ref 65–99)

## 2016-08-25 MED ORDER — METFORMIN HCL 500 MG PO TABS
1000.0000 mg | ORAL_TABLET | Freq: Two times a day (BID) | ORAL | Status: DC
  Administered 2016-08-25 – 2016-08-29 (×8): 1000 mg via ORAL
  Filled 2016-08-25 (×14): qty 2

## 2016-08-25 MED ORDER — OLANZAPINE 5 MG PO TABS: 5.0000 mg | ORAL_TABLET | Freq: Every day | ORAL | Status: DC

## 2016-08-25 MED ORDER — IBUPROFEN 600 MG PO TABS
600.0000 mg | ORAL_TABLET | Freq: Three times a day (TID) | ORAL | Status: DC | PRN
  Administered 2016-08-27: 600 mg via ORAL
  Filled 2016-08-25: qty 1

## 2016-08-25 MED ORDER — SERTRALINE HCL 50 MG PO TABS
50.0000 mg | ORAL_TABLET | Freq: Every day | ORAL | Status: DC
  Administered 2016-08-26: 50 mg via ORAL
  Filled 2016-08-25 (×3): qty 1

## 2016-08-25 MED ORDER — ACETAMINOPHEN 325 MG PO TABS
650.0000 mg | ORAL_TABLET | ORAL | Status: DC | PRN
  Administered 2016-08-25 – 2016-08-28 (×3): 650 mg via ORAL
  Filled 2016-08-25 (×3): qty 2

## 2016-08-25 MED ORDER — ONDANSETRON HCL 4 MG PO TABS
4.0000 mg | ORAL_TABLET | Freq: Three times a day (TID) | ORAL | Status: DC | PRN
  Administered 2016-08-27 – 2016-08-29 (×2): 4 mg via ORAL
  Filled 2016-08-25 (×2): qty 1

## 2016-08-25 MED ORDER — OLANZAPINE 5 MG PO TABS
5.0000 mg | ORAL_TABLET | Freq: Every day | ORAL | Status: DC
  Administered 2016-08-25: 5 mg via ORAL
  Filled 2016-08-25 (×3): qty 1

## 2016-08-25 MED ORDER — MAGNESIUM HYDROXIDE 400 MG/5ML PO SUSP: 30.0000 mL | Freq: Every day | ORAL | Status: DC | PRN

## 2016-08-25 MED ORDER — LORATADINE 10 MG PO TABS
10.0000 mg | ORAL_TABLET | Freq: Every day | ORAL | Status: DC
  Administered 2016-08-26 – 2016-08-29 (×4): 10 mg via ORAL
  Filled 2016-08-25 (×7): qty 1

## 2016-08-25 MED ORDER — ALUM & MAG HYDROXIDE-SIMETH 200-200-20 MG/5ML PO SUSP: 30.0000 mL | ORAL | Status: DC | PRN

## 2016-08-25 NOTE — BH Assessment (Signed)
Tele Assessment Note   Stephanie Mack is an 26 y.o. female, African American who presents to [facility] ED per ED report: Pt was IVC by her grandmother. Pt has hx of depression. Pt reports that May is the anniversary for the death of one of her child's and thus a tough month for her. She has been crying and feeling unwell for several days. Pt took pills and contemplated overdosing tonight, thus grand mother IVC her, and sent her to the ER. Patient states that primary concern is of depression due to anniversary month of death of her child. Patient states that she has been sleeping more than usual lately with 10 hours or more per night. Patient states that she resides with mother.  Patient denies current SI but acknowledges SI earlier, no plan. Patient denies current HI and AVH. Patient denies hx. Of S.A. Patient has been seen inpatient for psych care in 2017 and 2012 at [facility] for Bipolar Depression. Patient states she is seen outpatient currently for psych care at [facility]. Patient is dressed in scrubs and is alert and oriented x4. Patient speech was within normal limits and motor behavior appeared normal. Patient thought process is coherent. Patient does not appear to be responding to internal stimuli. Patient was cooperative throughout the assessment and states that  she is  Not agreeable to inpatient psychiatric treatment.   Diagnosis: Bipolar I Disorder, Current Episode Depressed  Past Medical History:  Past Medical History:  Diagnosis Date  . ADHD (attention deficit hyperactivity disorder)   . Anemia   . Anxiety   . Bipolar 1 disorder ([place])   . Chlamydia 05-31-10  . Chronic constipation   . Depression    depression  . GERD (gastroesophageal reflux disease)    with pregnancy  . Gestational diabetes    current pregnancy  . Gonorrhea contact, treated   . Headache   . Infection    UTI  . Pregnancy induced hypertension    previous pregnancy  . Pseudocyesis 2013   Seen in MAU for  percieved FM, abd distension. Normal exam.   . Sickle cell trait ([place])     Past Surgical History:  Procedure Laterality Date  . CESAREAN SECTION N/A 09/01/2012   Procedure:  Primary cesarean section with delivery of baby girl at 84.  Apgars 1/1.  ;  Surgeon: Frederico Hamman, MD;  Location: [facility];  Service: Obstetrics;  Laterality: N/A;  . CESAREAN SECTION N/A 01/07/2015   Procedure: REPEAT CESAREAN SECTION;  Surgeon: Shelly Bombard, MD;  Location: [facility];  Service: Obstetrics;  Laterality: N/A;  . NASAL SEPTUM SURGERY    . WISDOM TOOTH EXTRACTION      Family History:  Family History  Problem Relation Age of Onset  . Kidney disease Mother   . Hypertension Father   . Cancer Sister   . Asthma Brother   . Diabetes Maternal Grandfather   . Heart disease Neg Hx     Social History:  reports that she has never smoked. She has never used smokeless tobacco. She reports that she does not drink alcohol or use drugs.  Additional Social History:  Alcohol / Drug Use Pain Medications: SEE MAR Prescriptions: SEE MAR Over the Counter: SEE MAR History of alcohol / drug use?: No history of alcohol / drug abuse  CIWA: CIWA-Ar BP: 128/87 Pulse Rate: 89 COWS:    PATIENT STRENGTHS: (choose at least two) Ability for insight Average or above average intelligence Capable of independent living  Allergies: No Known Allergies  Home Medications:  (Not in a hospital admission)  OB/GYN Status:  No LMP recorded (lmp unknown).  General Assessment Data [facility] of Assessment: [facility] TTS Assessment: In system Is this a Tele or Face-to-Face Assessment?: Tele Assessment Is this an Initial Assessment or a Re-assessment for this encounter?: Initial Assessment Marital status: Single Maiden name: n/a Is patient pregnant?: No Pregnancy Status: No Living Arrangements: Parent Can pt return to current living arrangement?: Yes Admission Status: Involuntary Is patient capable of signing voluntary  admission?: Yes Referral Source: Other Insurance type: Medicaid     Crisis Care Plan Living Arrangements: Parent Name of Psychiatrist: [facility] Name of Therapist: [facility]  Education Status Is patient currently in school?: No Current Grade: n/a Highest grade of school patient has completed: 12th Name of school: n/a Contact person: none given  Risk to self with the past 6 months Suicidal Ideation: No Has patient been a risk to self within the past 6 months prior to admission? : No Suicidal Intent: No Has patient had any suicidal intent within the past 6 months prior to admission? : No Is patient at risk for suicide?: Yes Suicidal Plan?: No Has patient had any suicidal plan within the past 6 months prior to admission? : No Access to Means: Yes Specify Access to Suicidal Means: access to pills What has been your use of drugs/alcohol within the last 12 months?: none Previous Attempts/Gestures: Yes How many times?: 1 Other Self Harm Risks: none Triggers for Past Attempts: Unknown Intentional Self Injurious Behavior: None Family Suicide History: No Recent stressful life event(s): Trauma (Comment) (anniversary month death of child) Persecutory voices/beliefs?: No Depression: Yes Depression Symptoms: Insomnia, Tearfulness, Isolating, Fatigue, Guilt, Loss of interest in usual pleasures, Feeling worthless/self pity Substance abuse history and/or treatment for substance abuse?: No Suicide prevention information given to non-admitted patients: Yes  Risk to Others within the past 6 months Homicidal Ideation: No Does patient have any lifetime risk of violence toward others beyond the six months prior to admission? : No Thoughts of Harm to Others: No Current Homicidal Intent: No Current Homicidal Plan: No Access to Homicidal Means: No Identified Victim: none History of harm to others?: No Assessment of Violence: None Noted Violent Behavior Description: n/a Does patient have access  to weapons?: No Criminal Charges Pending?: No Does patient have a court date: No Is patient on probation?: No  Psychosis Hallucinations: None noted Delusions: None noted  Mental Status Report Appearance/Hygiene: In scrubs Eye Contact: Fair Motor Activity: Freedom of movement Speech: Logical/coherent Level of Consciousness: Alert Mood: Depressed Affect: Depressed Anxiety Level: Moderate Thought Processes: Relevant Judgement: Unimpaired Orientation: Person, Place, Time, Situation, Appropriate for developmental age Obsessive Compulsive Thoughts/Behaviors: None  Cognitive Functioning Concentration: Decreased Memory: Recent Intact, Remote Intact IQ: Average Insight: Fair Impulse Control: Poor Appetite: Fair Weight Loss: 0 Weight Gain: 0 Sleep: Increased Total Hours of Sleep: 12 Vegetative Symptoms: None  ADLScreening [facility] Assessment Services) Patient's cognitive ability adequate to safely complete daily activities?: Yes Patient able to express need for assistance with ADLs?: Yes Independently performs ADLs?: Yes (appropriate for developmental age)  Prior Inpatient Therapy Prior Inpatient Therapy: Yes Prior Therapy Dates: 2017, 2012 Prior Therapy Facilty/Provider(s): [facility] Reason for Treatment: Bipolar Depression  Prior Outpatient Therapy Prior Outpatient Therapy: Yes Prior Therapy Dates: current Prior Therapy Facilty/Provider(s): [facility] Reason for Treatment: bipolar Does patient have an ACCT team?: No Does patient have Intensive In-House Services?  : No Does patient have [facility] services? : Yes Does  patient have P4CC services?: No  ADL Screening (condition at time of admission) Patient's cognitive ability adequate to safely complete daily activities?: Yes Is the patient deaf or have difficulty hearing?: No Does the patient have difficulty seeing, even when wearing glasses/contacts?: No Does the patient have difficulty concentrating, remembering, or making  decisions?: No Patient able to express need for assistance with ADLs?: Yes Does the patient have difficulty dressing or bathing?: No Independently performs ADLs?: Yes (appropriate for developmental age) Does the patient have difficulty walking or climbing stairs?: No Weakness of Legs: None Weakness of Arms/Hands: None       Abuse/Neglect Assessment (Assessment to be complete while patient is alone) Physical Abuse: Denies Verbal Abuse: Denies Sexual Abuse: Denies Exploitation of patient/patient's resources: Denies Self-Neglect: Denies Values / Beliefs Cultural Requests During Hospitalization: None Spiritual Requests During Hospitalization: None   Advance Directives (For Healthcare) Does Patient Have a Medical Advance Directive?: No    Additional Information 1:1 In Past 12 Months?: No CIRT Risk: No Elopement Risk: No Does patient have medical clearance?: Yes     Disposition: Per Patriciaann Clan NP meets inpatient criteria Disposition Initial Assessment Completed for this Encounter: Yes Disposition of Patient: Other dispositions (TBD)  Mahkai Fangman K Leala Bryand 08/25/2016 2:10 AM

## 2016-08-25 NOTE — Progress Notes (Signed)
Per Patriciaann Clan , NP meets inpatient criteria Stephanie Mack. Ernst Cumpston, LCAS-A, LPC-A, [facility]  Counselor 08/25/2016 2:20 AM

## 2016-08-25 NOTE — ED Notes (Signed)
Pt transported to [facility] by [employer]. All belongings returned to pt who signed for same. Pt remained calm and cooperative.

## 2016-08-25 NOTE — ED Notes (Signed)
Per [place], [facility] patient has been accepted to [facility] after 8 am and Otila Kluver [facility] will call with bed assignments.
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
## 2016-08-25 NOTE — Progress Notes (Signed)
Nursing Progress Note: 7p-7a D: Pt currently presents with a pleasant affect and behavior. Pt states "I have a lot of times that I just break down and cry during the day. Nothing triggers this. It just happens." Interacting well with milieu. Pt reports good sleep with current medication regimen.   A: Pt provided with medications per providers orders. Pt's labs and vitals were monitored throughout the night. Pt supported emotionally and encouraged to express concerns and questions. Pt educated on medications.  R: Pt's safety ensured with 15 minute and environmental checks. Pt currently denies SI/HI/Self Harm and AVH. Pt verbally contracts to seek staff if SI/HI or A/VH occurs and to consult with staff before acting on any harmful thoughts. Will continue to monitor.

## 2016-08-25 NOTE — Progress Notes (Signed)
Pt attend wrap up group. 

## 2016-08-25 NOTE — BH Assessment (Signed)
[facility] Assessment Progress Note  Per Patriciaann Clan, PA, this pt requires psychiatric hospitalization.  Letitia Libra, RN, [facility] has assigned pt to [facility] Rm 406-2.  Pt presents under IVC initiated by her grandmother, and upheld by EDP Varney Biles, MD, and IVC documents have been faxed to [facility].  Pt's nurse, Diane, has been notified, and agrees to call report to [phone].  Pt is to be transported via Event organiser.   Jalene Mullet, [place] Triage Specialist [phone]

## 2016-08-25 NOTE — Tx Team (Signed)
Initial Treatment Plan 08/25/2016 5:26 PM Stephanie Mack [id number]    PATIENT STRESSORS: Loss of daughter to death   PATIENT STRENGTHS: Average or above average intelligence Communication skills Motivation for treatment/growth Supportive family/friends   PATIENT IDENTIFIED PROBLEMS: At risk for suicide  Grief  Depression  "Keep taking my medicine"  "Get well for my daughter"             DISCHARGE CRITERIA:  Ability to meet basic life and health needs Improved stabilization in mood, thinking, and/or behavior Motivation to continue treatment in a less acute level of care Need for constant or close observation no longer present Verbal commitment to aftercare and medication compliance  PRELIMINARY DISCHARGE PLAN: Outpatient therapy Return to previous living arrangement  PATIENT/FAMILY INVOLVEMENT: This treatment plan has been presented to and reviewed with the patient, Stephanie Mack.  The patient and family have been given the opportunity to ask questions and make suggestions.  Dustin Flock, RN 08/25/2016, 5:26 PM

## 2016-08-25 NOTE — ED Notes (Signed)
Introduced self to patient.  Assessed pt for:  A) Anxiety &/or agitation: This morning pt is anxious, depressed and tearful because she does not want to be admitted to the hospital. She wants to go home.   S) Safety: Safety maintained with q-15-minute checks and hourly rounds by staff.  A) ADLs: Pt able to perform ADLs independently.  P) Pick-Up (room cleanliness): Pt's room clean and free of clutter.

## 2016-08-25 NOTE — Social Work (Signed)
Referred to [facility] Team, is [place] resident.  Anne Cunningham, LCSW Lead Clinical Social Worker Phone:  [phone]  

## 2016-08-26 LAB — GLUCOSE, CAPILLARY
GLUCOSE-CAPILLARY: 114 mg/dL — AB (ref 65–99)
GLUCOSE-CAPILLARY: 86 mg/dL (ref 65–99)
Glucose-Capillary: 159 mg/dL — ABNORMAL HIGH (ref 65–99)

## 2016-08-26 MED ORDER — TRAZODONE HCL 50 MG PO TABS
50.0000 mg | ORAL_TABLET | Freq: Every evening | ORAL | Status: DC | PRN
  Administered 2016-08-26 – 2016-08-28 (×3): 50 mg via ORAL
  Filled 2016-08-26 (×3): qty 1

## 2016-08-26 MED ORDER — SERTRALINE HCL 25 MG PO TABS
75.0000 mg | ORAL_TABLET | Freq: Every day | ORAL | Status: DC
  Administered 2016-08-27 – 2016-08-29 (×3): 75 mg via ORAL
  Filled 2016-08-26 (×5): qty 3

## 2016-08-26 MED ORDER — ARIPIPRAZOLE 5 MG PO TABS
5.0000 mg | ORAL_TABLET | Freq: Every day | ORAL | Status: DC
  Administered 2016-08-26 – 2016-08-29 (×4): 5 mg via ORAL
  Filled 2016-08-26 (×7): qty 1

## 2016-08-26 NOTE — BHH Group Notes (Signed)
[facility] LCSW Group Therapy 08/26/2016 1:15pm  Type of Therapy: Group Therapy- Feelings Around Relapse and Recovery  Participation Level: Pt was present. Did not participate in the group discussion.  Georga Kaufmann, MSW, [facility]er [phone] 08/26/2016 4:02 PM
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
## 2016-08-26 NOTE — Progress Notes (Signed)
Pt attend group. Her day was a 10. Her goal to get well so she can go home. She talk to the doctor and she supposed to go home Monday.

## 2016-08-26 NOTE — Progress Notes (Signed)
Recreation Therapy Notes  Date: 08/26/16 Time: 0930 Location: 300 Hall Dayroom  Group Topic: Stress Management  Goal Area(s) Addresses:  Patient will verbalize importance of using healthy stress management.  Patient will identify positive emotions associated with healthy stress management.   Behavioral Response: Engaged  Intervention: Stress Management  Activity :  Gratitude Meditation.  LRT introduced the stress management technique of meditation to patients.  LRT played a meditation from the Calm app to allow patients to engage in the technique.  Patients were to follow along as the meditation played to participate in the practice.  Education:  Stress Management, Discharge Planning.   Education Outcome: Acknowledges edcuation/In group clarification offered/Needs additional education  Clinical Observations/Feedback: Pt attended group.   Victorino Sparrow, LRT/CTRS         Ria Comment, Earlee Herald A 08/26/2016 11:20 AM

## 2016-08-26 NOTE — Social Work (Signed)
Referred to [facility], is [place] resident.  Garnet Overfield, LCSW Lead Clinical Social Worker Phone:  [phone]  

## 2016-08-26 NOTE — Progress Notes (Addendum)
Stephanie Mack is quiet today. Eager to meet this writer this morning and share her feelings. She does take her scheduled meds as planned. A She completed her daily assessment and on this she wrote she deneid SI today and she rated her depression, hopelessness and anxiety " 0/0/0",respectively. Pt integrating into unit milieu appropriately. R Safety in place.
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
## 2016-08-26 NOTE — Tx Team (Signed)
Interdisciplinary Treatment and Diagnostic Plan Update 08/26/2016 Time of Session: 9:30am  Stephanie Mack  MRN: [id number]  Principal Diagnosis: Depression  Secondary Diagnoses: Active Problems:   Bipolar affective disorder, depressed, severe ([facility])   Current Medications:  Current Facility-Administered Medications  Medication Dose Route Frequency Provider Last Rate Last Dose  . acetaminophen (TYLENOL) tablet 650 mg  650 mg Oral Q4H PRN Patrecia Pour, NP   650 mg at 08/25/16 2201  . alum & mag hydroxide-simeth (MAALOX/MYLANTA) 200-200-20 MG/5ML suspension 30 mL  30 mL Oral Q4H PRN Patrecia Pour, NP      . ibuprofen (ADVIL,MOTRIN) tablet 600 mg  600 mg Oral Q8H PRN Patrecia Pour, NP      . loratadine (CLARITIN) tablet 10 mg  10 mg Oral Daily Patrecia Pour, NP   10 mg at 08/26/16 0845  . magnesium hydroxide (MILK OF MAGNESIA) suspension 30 mL  30 mL Oral Daily PRN Patrecia Pour, NP      . metFORMIN (GLUCOPHAGE) tablet 1,000 mg  1,000 mg Oral BID WC Patrecia Pour, NP   1,000 mg at 08/26/16 0845  . OLANZapine (ZYPREXA) tablet 5 mg  5 mg Oral QHS Patrecia Pour, NP   5 mg at 08/25/16 2201  . ondansetron (ZOFRAN) tablet 4 mg  4 mg Oral Q8H PRN Patrecia Pour, NP      . sertraline (ZOLOFT) tablet 50 mg  50 mg Oral Daily Patrecia Pour, NP   50 mg at 08/26/16 0845    PTA Medications: Prescriptions Prior to Admission  Medication Sig Dispense Refill Last Dose  . ibuprofen (ADVIL,MOTRIN) 600 MG tablet TAKE ONE TABLET BY MOUTH EVERY 6 HOURS AS NEEDED for mild pain (Patient not taking: Reported on 08/24/2016) 30 tablet 5 Not Taking at Unknown time  . loratadine (CLARITIN) 10 MG tablet TAKE ONE TABLET BY MOUTH EVERY DAY (Patient not taking: Reported on 08/24/2016) 30 tablet 11 Not Taking at Unknown time  . medroxyPROGESTERone (DEPO-PROVERA) 150 MG/ML injection Inject 1 mL (150 mg total) into the muscle every 3 (three) months. 1 mL 4 06/2016  . metFORMIN (GLUCOPHAGE) 1000 MG tablet Take 1  tablet (1,000 mg total) by mouth 2 (two) times daily with a meal. 180 tablet 1 08/24/2016 at Unknown time  . OLANZapine (ZYPREXA) 5 MG tablet Take 1 tablet (5 mg total) by mouth at bedtime. For mood control 30 tablet 0 08/23/2016 at Unknown time  . Prenat w/o A-FeCbn-Meth-FA-DHA (PRENATE MINI) 29-0.6-0.4-350 MG CAPS Take 1 capsule by mouth daily before breakfast. (Patient not taking: Reported on 08/24/2016) 90 capsule 3 Not Taking at Unknown time  . sertraline (ZOLOFT) 50 MG tablet Take 1 tablet (50 mg total) by mouth daily. For depression 30 tablet 0 08/23/2016 at Unknown time    Treatment Modalities: Medication Management, Group therapy, Case management,  1 to 1 session with clinician, Psychoeducation, Recreational therapy.  Patient Stressors: Loss of daughter to death Patient Strengths: Average or above average intelligence Communication skills Motivation for treatment/growth Supportive family/friends  Physician Treatment Plan for Primary Diagnosis: Depression Long Term Goal(s): Improvement in symptoms so as ready for discharge Short Term Goals:    Medication Management: Evaluate patient's response, side effects, and tolerance of medication regimen.  Therapeutic Interventions: 1 to 1 sessions, Unit Group sessions and Medication administration.  Evaluation of Outcomes: Not Met  Physician Treatment Plan for Secondary Diagnosis: Active Problems:   Bipolar affective disorder, depressed, severe ([place])  Long Term Goal(s): Improvement in symptoms  so as ready for discharge  Short Term Goals:    Medication Management: Evaluate patient's response, side effects, and tolerance of medication regimen.  Therapeutic Interventions: 1 to 1 sessions, Unit Group sessions and Medication administration.  Evaluation of Outcomes: Not Met  RN Treatment Plan for Primary Diagnosis: Depression Long Term Goal(s): Knowledge of disease and therapeutic regimen to maintain health will improve  Short Term  Goals: Ability to disclose and discuss suicidal ideas and Compliance with prescribed medications will improve  Medication Management: RN will administer medications as ordered by provider, will assess and evaluate patient's response and provide education to patient for prescribed medication. RN will report any adverse and/or side effects to prescribing provider.  Therapeutic Interventions: 1 on 1 counseling sessions, Psychoeducation, Medication administration, Evaluate responses to treatment, Monitor vital signs and CBGs as ordered, Perform/monitor CIWA, COWS, AIMS and Fall Risk screenings as ordered, Perform wound care treatments as ordered.  Evaluation of Outcomes: Not Met  LCSW Treatment Plan for Primary Diagnosis: Depression Long Term Goal(s): Safe transition to appropriate next level of care at discharge, Engage patient in therapeutic group addressing interpersonal concerns. Short Term Goals: Engage patient in aftercare planning with referrals and resources, Increase ability to appropriately verbalize feelings, Identify triggers associated with mental health/substance abuse issues and Increase skills for wellness and recovery  Therapeutic Interventions: Assess for all discharge needs, 1 to 1 time with Social worker, Explore available resources and support systems, Assess for adequacy in community support network, Educate family and significant other(s) on suicide prevention, Complete Psychosocial Assessment, Interpersonal group therapy.  Evaluation of Outcomes: Not Met  Progress in Treatment: Attending groups: Pt is new to milieu, continuing to assess  Participating in groups: Pt is new to milieu, continuing to assess  Taking medication as prescribed: Yes, MD continues to assess for medication changes as needed Toleration medication: Yes, no side effects reported at this time Family/Significant other contact made: No, CSW assessing for appropriate contact Patient understands diagnosis:  Continuing to assess Discussing patient identified problems/goals with staff: Yes Medical problems stabilized or resolved: Yes Denies suicidal/homicidal ideation: No, pt recently admitted for SI. Issues/concerns per patient self-inventory: None Other: N/A  New problem(s) identified: None identified at this time.   New Short Term/Long Term Goal(s): None identified at this time.   Discharge Plan or Barriers: Pt will return home and follow up outpatient with [facility].  Reason for Continuation of Hospitalization:  Depression Medication stabilization Suicidal ideation  Estimated Length of Stay: 3-5 days  Attendees: Patient: 08/26/2016 10:21 AM  Physician: Dr. Parke Poisson 08/26/2016 10:21 AM  Nursing: Chong Sicilian RN; Kieth Brightly, RN 08/26/2016 10:21 AM  RN Care Manager: Lars Pinks, RN 08/26/2016 10:21 AM  Social Worker: Adriana Reams, LCSW; Matthew Saras, [place] 08/26/2016 10:21 AM  Recreational Therapist:  08/26/2016 10:21 AM  Other: Lindell Spar, NP; Samuel Jester, NP 08/26/2016 10:21 AM  Other:  08/26/2016 10:21 AM  Other: 08/26/2016 10:21 AM   Scribe for Treatment Team: Georga Kaufmann, MSW,LCSWA 08/26/2016 10:21 AM
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
## 2016-08-27 DIAGNOSIS — G4700 Insomnia, unspecified: Secondary | ICD-10-CM

## 2016-08-27 DIAGNOSIS — K219 Gastro-esophageal reflux disease without esophagitis: Secondary | ICD-10-CM

## 2016-08-27 DIAGNOSIS — E118 Type 2 diabetes mellitus with unspecified complications: Secondary | ICD-10-CM

## 2016-08-27 LAB — LIPID PANEL
CHOL/HDL RATIO: 4.1 ratio
Cholesterol: 194 mg/dL (ref 0–200)
HDL: 47 mg/dL (ref >40)
LDL Cholesterol: 105 mg/dL — ABNORMAL HIGH (ref 0–99)
TRIGLYCERIDES: 211 mg/dL — AB (ref <150)
VLDL: 42 mg/dL — ABNORMAL HIGH (ref 0–40)

## 2016-08-27 LAB — GLUCOSE, CAPILLARY
GLUCOSE-CAPILLARY: 156 mg/dL — AB (ref 65–99)
GLUCOSE-CAPILLARY: 150 mg/dL — AB (ref 65–99)

## 2016-08-27 MED ORDER — PANTOPRAZOLE SODIUM 20 MG PO TBEC
20.0000 mg | DELAYED_RELEASE_TABLET | Freq: Every day | ORAL | Status: DC
  Administered 2016-08-27 – 2016-08-29 (×3): 20 mg via ORAL
  Filled 2016-08-27 (×4): qty 1

## 2016-08-27 MED ORDER — PANTOPRAZOLE SODIUM 40 MG PO TBEC
DELAYED_RELEASE_TABLET | ORAL | Status: AC
  Administered 2016-08-27: 19:00:00
  Filled 2016-08-27: qty 1

## 2016-08-27 NOTE — Progress Notes (Signed)
Stephanie Mack is visible...seen out in the milieu interacting with her peers throughout most of the day. She attends her groups and she is engaged in her recovery and this is evidenced as she converses with Probation officer about how to handle things differently..influenza the future. A She completed her daily assessment and on this she wrote she denied SI today and she rated her derpession, hopelessness anda xneity " 0/0/0/", respectively. R She has had hiccups off and on throughout the day and is started on protonix for c/o heartburn and indigestion. Safety in place.
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
## 2016-08-27 NOTE — BHH Group Notes (Signed)
Adult Group Therapy Note  Date:  08/27/2016  Time: 10:00AM-11:00AM  Group Topic/Focus: Today's group focused on the topic of healthy and unhealthy coping skills. The initial rewarding but eventual negative elements of using unhealthy coping skills were discussed, particularly drinking, drugs, suicidal thoughts or actions, oversleeping, and isolating.  Group members talked about current healthy skills they try to use as well as specific new healthy coping skills they would like to learn.  There was a great deal of processing throughout group, and positive communication and acceptance were modeled for the patients.  At the end, a grounding exercise with patients looking at and focusing on their feet was used for 5 minutes and all patients identified themselves as being calmer afterward.  They were each given the homework of working on their identified healthy coping technique the remainder of the day.  Participation Level:  Active  Participation Quality:  Attentive  Affect:  Blunted and Depressed  Cognitive:  Appropriate  Insight: Improving  Engagement in Group:  Engaged  Modes of Intervention:  Activity, Discussion and Support  Additional Comments:  The patient expressed that she likes socializing in the dayroom with other patients as a positive, healthy coping technique.  She wants to learn more about how to meditate.  Berlin Hun Grossman-Orr 08/27/2016 , 11:11 AM

## 2016-08-27 NOTE — Progress Notes (Signed)
D: Pt was in the dayroom upon initial approach.  Pt presents with depressed affect and mood.  Her goal is to "get well for my daughter."  She reports the best part of her day was "laughing, socializing with the people."  Pt denies SI/HI, denies hallucinations, reports pain from headache of 8/10.  Pt has been visible in milieu interacting with peers and staff appropriately.  Pt attended evening group.    A: Introduced self to pt.  Actively listened to pt and offered support and encouragement. PRN medication administered for sleep and pain.  Q15 minute safety checks maintained.  R: Pt is safe on the unit.  Pt is compliant with medications.  Pt verbally contracts for safety.  Will continue to monitor and assess.

## 2016-08-28 DIAGNOSIS — Z818 Family history of other mental and behavioral disorders: Secondary | ICD-10-CM

## 2016-08-28 LAB — GLUCOSE, CAPILLARY
Glucose-Capillary: 103 mg/dL — ABNORMAL HIGH (ref 65–99)
Glucose-Capillary: 125 mg/dL — ABNORMAL HIGH (ref 65–99)

## 2016-08-28 LAB — HEMOGLOBIN A1C
Hgb A1c MFr Bld: 6.3 % — ABNORMAL HIGH (ref 4.8–5.6)
MEAN PLASMA GLUCOSE: 134 mg/dL

## 2016-08-28 NOTE — BHH Group Notes (Signed)
[place] Group Notes:  (Clinical Social Work)   08/28/2016    11:00AM-12:00PM  Summary of Progress/Problems:   The main focus of today's process group was to   1)  discuss the importance of adding supports  2  Identify the patient's current healthy supports and plan what to add.  An emphasis was placed on using counselor, doctor, therapy groups, 12-step groups, and problem-specific support groups to expand supports.    The patient expressed full comprehension of the concepts presented, and agreed that there is a need to add more supports.  The patient stated her mother and grandmother, her therapist, and her friends are healthy supports.  She said she does not have a place to go where people understand her because they go through the same thing, and so CSW and group members talked with her about joining support groups.  Type of Therapy:  Process Group with Motivational Interviewing  Participation Level:  Active  Participation Quality:  Attentive  Affect:  Blunted and Depressed  Cognitive:  Appropriate  Insight:  Improving  Engagement in Therapy:  Engaged  Modes of Intervention:   Education, Support and Processing  Selmer Dominion, LCSW 08/28/2016    12:50 PM

## 2016-08-28 NOTE — Progress Notes (Signed)
[place] Group Notes:  (Nursing/MHT/Case Management/Adjunct)  Date:  08/28/2016  Time:  2:35 AM  Type of Therapy:  Psychoeducational Skills  Participation Level:  Active  Participation Quality:  Appropriate  Affect:  Appropriate  Cognitive:  Appropriate  Insight:  Appropriate  Engagement in Group:  Engaged  Modes of Intervention:  Education  Summary of Progress/Problems: Patient verbalized that she had a positive day since she was able to laugh a great deal and because she had a good family visit with her parents. In terms of the theme for the day, her coping skill will be to exercise and spend time with her daughter.   Stephanie Mack 08/28/2016, 2:35 AM

## 2016-08-28 NOTE — Plan of Care (Signed)
Problem: Safety: Goal: Periods of time without injury will increase Outcome: Progressing Pt has not harmed self or others tonight.  She denies SI/HI and verbally contracts for safety.    

## 2016-08-28 NOTE — BHH Suicide Risk Assessment (Signed)
[place] INPATIENT:  Family/Significant Other Suicide Prevention Education  Suicide Prevention Education:  Education Completed; Tamera Stands, grandmother, [phone], has been identified by the patient as the family member/significant other with whom the patient will be residing, and identified as the person(s) who will aid the patient in the event of a mental health crisis (suicidal ideations/suicide attempt).  With written consent from the patient, the family member/significant other has been provided the following suicide prevention education, prior to the and/or following the discharge of the patient.  The suicide prevention education provided includes the following:  Suicide risk factors  Suicide prevention and interventions  National Suicide Hotline telephone number  [facility] assessment telephone number  [facility] Emergency Assistance [place] and/or [facility] Unit telephone number  Request made of family/significant other to:  Remove weapons (e.g., guns, rifles, knives), all items previously/currently identified as safety concern.    Remove drugs/medications (over-the-counter, prescriptions, illicit drugs), all items previously/currently identified as a safety concern.  The family member/significant other verbalizes understanding of the suicide prevention education information provided.  The family member/significant other agrees to remove the items of safety concern listed above.  Christene Lye 08/28/2016, 4:21 PM

## 2016-08-28 NOTE — Progress Notes (Signed)
    D Stephanie Mack is quiet but present in the dayroom today. She cont to take her meds as scheduled and she completes her daily assessment . ON this, she writes she denies SI today and she rates her depression, hopelessness and anxiety " 0/0/0", respectively. A SHe complained of " stomach pain" immediately after eating lunch and was given 2 tylenol that she requested. She said she felt " much better" 1 hr later. She shared she is " hoping to go home tomorrow..I  think I'm ready". Writer encouraged her to cont to explore healthier coping skills and to put energy into healthy living , as opposed to unhealthy living. R Safety is in place.

## 2016-08-28 NOTE — BHH Group Notes (Signed)
Goals Group  Date:  08/28/2016  Time:  0930  Type of Therapy:  Nurse Education / Goals Group : The group focuses on teaching patients how to make attainable daily goals that will aide them in their walk to recovery.   Participation Level:  Active  Participation Quality:  Attentive  Affect:  Depressed  Cognitive:  Appropriate  Insight:  Appropriate  Engagement in Group:  Engaged  Modes of Intervention:  Education  Summary of Progress/Problems:  Stephanie Mack 08/28/2016, 6:31 PM
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
## 2016-08-28 NOTE — Progress Notes (Signed)
D: Pt was at the nurse's station upon initial approach.  Pt presents with appropriate affect and mood; she smiles upon approach.  Her goal is to "keep socializing with everybody."  Pt denies SI/HI, denies hallucinations, denies pain.  Pt has been visible in milieu interacting with peers and staff appropriately.  Pt attended evening group.    A: Introduced self to pt.  Actively listened to pt and offered support and encouragement.  PRN medication administered for sleep.  Prune juice provided since pt complained of constipation.  PO fluids encouraged and provided.  Pt encouraged to walk.  Q15 minute safety checks maintained.  R: Pt is safe on the unit.  She reports she had a bowel movement after drinking prune juice.  Pt is compliant with medication.  Pt verbally contracts for safety.  Will continue to monitor and assess.

## 2016-08-29 ENCOUNTER — Encounter (HOSPITAL_COMMUNITY): Payer: Self-pay | Admitting: Psychiatry

## 2016-08-29 DIAGNOSIS — F314 Bipolar disorder, current episode depressed, severe, without psychotic features: Secondary | ICD-10-CM

## 2016-08-29 LAB — PROLACTIN: Prolactin: 7.7 ng/mL (ref 4.8–23.3)

## 2016-08-29 LAB — GLUCOSE, CAPILLARY: GLUCOSE-CAPILLARY: 111 mg/dL — AB (ref 65–99)

## 2016-08-29 MED ORDER — METFORMIN HCL 1000 MG PO TABS: 1000.0000 mg | ORAL_TABLET | Freq: Two times a day (BID) | ORAL | 0 refills | Status: DC

## 2016-08-29 MED ORDER — SERTRALINE HCL 25 MG PO TABS: 75.0000 mg | ORAL_TABLET | Freq: Every day | ORAL | 0 refills | Status: DC

## 2016-08-29 MED ORDER — PROPRANOLOL HCL 10 MG PO TABS: 10.0000 mg | ORAL_TABLET | Freq: Two times a day (BID) | ORAL | 0 refills | Status: DC

## 2016-08-29 MED ORDER — PROPRANOLOL HCL 10 MG PO TABS
10.0000 mg | ORAL_TABLET | Freq: Two times a day (BID) | ORAL | Status: DC
  Administered 2016-08-29: 10 mg via ORAL
  Filled 2016-08-29 (×6): qty 1

## 2016-08-29 MED ORDER — TRAZODONE HCL 50 MG PO TABS: 50.0000 mg | ORAL_TABLET | Freq: Every evening | ORAL | 0 refills | Status: DC | PRN

## 2016-08-29 MED ORDER — ARIPIPRAZOLE 5 MG PO TABS: 5.0000 mg | ORAL_TABLET | Freq: Every day | ORAL | 0 refills | Status: DC

## 2016-08-29 MED ORDER — PANTOPRAZOLE SODIUM 20 MG PO TBEC: 20.0000 mg | DELAYED_RELEASE_TABLET | Freq: Every day | ORAL | 0 refills | Status: DC
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
## 2016-08-29 NOTE — Progress Notes (Signed)
  [facility] Adult Case Management Discharge Plan :  Will you be returning to the same living situation after discharge:  Yes,  pt returning home. At discharge, do you have transportation home?: Yes,  pt's family wil ltransport. Do you have the ability to pay for your medications: Yes,  pt has insurance.  Release of information consent forms completed and in the chart;  Patient's signature needed at discharge.  Patient to Follow up at: Follow-up Information    [facility] Go on 09/07/2016.   Specialty:  Behavioral Health Why:  Hospital follow up appointment @ 10:45am. If you are in need of mental health services before your scheduled appointment, please go to [facility] as a walk-in. Walk-in hours are Mon-Fri 8am-3pm. Please arrive as early as possible to be sure you are seen. Contact information: [place] [address] [phone]           Next level of care provider has access to [place] and Suicide Prevention discussed: Yes,  with pt and with pt's grandmother.  Have you used any form of tobacco in the last 30 days? (Cigarettes, Smokeless Tobacco, Cigars, and/or Pipes): No  Has patient been referred to the Quitline?: N/A patient is not a smoker  Patient has been referred for addiction treatment: [place], MSW, LCSWA  08/29/2016, 11:18 AM

## 2016-08-29 NOTE — Progress Notes (Signed)
Patient ID: Stephanie Mack, female   DOB: 29-Nov-1990, 26 y.o.   MRN: [id number]  DAR: Pt. Denies SI/HI and A/V Hallucinations. She reports sleep is good, appetite is good, energy level is normal, and concentration is good. She rates depression, hopelessness, and anxiety 0/10. Patient does not report any pain or discomfort at this time. Support and encouragement provided to the patient. Scheduled medications administered per physician's orders. Patient is receptive and cooperative. She reports feeling better and ready for discharge. Q15 minute checks are maintained for safety.

## 2016-08-29 NOTE — Progress Notes (Signed)
[facility] Group Notes:  (Nursing/MHT/Case Management/Adjunct)  Date:  08/29/2016  Time:  2:25 AM  Type of Therapy:  Psychoeducational Skills  Participation Level:  Active  Participation Quality:  Appropriate  Affect:  Appropriate  Cognitive:  Appropriate  Insight:  Improving  Engagement in Group:  Engaged  Modes of Intervention:  Education  Summary of Progress/Problems: Pt.verbalized in group that she had a good day and that she good visit with her daughter this evening. Her goal for tomorrow is to get discharged.   Dray Dente S 08/29/2016, 2:25 AM

## 2016-08-29 NOTE — Tx Team (Signed)
Interdisciplinary Treatment and Diagnostic Plan Update 08/29/2016 Time of Session: 9:30am  Stephanie Mack  MRN: [id number]  Principal Diagnosis: Depression  Secondary Diagnoses: Principal Problem:   Bipolar affective disorder, depressed, severe ([place])   Current Medications:  Current Facility-Administered Medications  Medication Dose Route Frequency Provider Last Rate Last Dose  . acetaminophen (TYLENOL) tablet 650 mg  650 mg Oral Q4H PRN Patrecia Pour, NP   650 mg at 08/28/16 1448  . alum & mag hydroxide-simeth (MAALOX/MYLANTA) 200-200-20 MG/5ML suspension 30 mL  30 mL Oral Q4H PRN Patrecia Pour, NP      . ARIPiprazole (ABILIFY) tablet 5 mg  5 mg Oral Daily Cobos, Myer Peer, MD   5 mg at 08/29/16 0800  . ibuprofen (ADVIL,MOTRIN) tablet 600 mg  600 mg Oral Q8H PRN Patrecia Pour, NP   600 mg at 08/27/16 0319  . loratadine (CLARITIN) tablet 10 mg  10 mg Oral Daily Patrecia Pour, NP   10 mg at 08/29/16 0800  . magnesium hydroxide (MILK OF MAGNESIA) suspension 30 mL  30 mL Oral Daily PRN Patrecia Pour, NP      . metFORMIN (GLUCOPHAGE) tablet 1,000 mg  1,000 mg Oral BID WC Patrecia Pour, NP   1,000 mg at 08/29/16 0800  . ondansetron (ZOFRAN) tablet 4 mg  4 mg Oral Q8H PRN Patrecia Pour, NP   4 mg at 08/29/16 0759  . pantoprazole (PROTONIX) EC tablet 20 mg  20 mg Oral Daily Withrow, John C, FNP   20 mg at 08/29/16 0800  . propranolol (INDERAL) tablet 10 mg  10 mg Oral BID Ursula Alert, MD   10 mg at 08/29/16 1034  . sertraline (ZOLOFT) tablet 75 mg  75 mg Oral Daily Cobos, Myer Peer, MD   75 mg at 08/29/16 0800  . traZODone (DESYREL) tablet 50 mg  50 mg Oral QHS PRN Cobos, Myer Peer, MD   50 mg at 08/28/16 2123    PTA Medications: Prescriptions Prior to Admission  Medication Sig Dispense Refill Last Dose  . ibuprofen (ADVIL,MOTRIN) 600 MG tablet TAKE ONE TABLET BY MOUTH EVERY 6 HOURS AS NEEDED for mild pain (Patient not taking: Reported on 08/24/2016) 30 tablet 5 Not Taking  at Unknown time  . loratadine (CLARITIN) 10 MG tablet TAKE ONE TABLET BY MOUTH EVERY DAY (Patient not taking: Reported on 08/24/2016) 30 tablet 11 Not Taking at Unknown time  . medroxyPROGESTERone (DEPO-PROVERA) 150 MG/ML injection Inject 1 mL (150 mg total) into the muscle every 3 (three) months. 1 mL 4 06/2016  . metFORMIN (GLUCOPHAGE) 1000 MG tablet Take 1 tablet (1,000 mg total) by mouth 2 (two) times daily with a meal. 180 tablet 1 08/24/2016 at Unknown time  . OLANZapine (ZYPREXA) 5 MG tablet Take 1 tablet (5 mg total) by mouth at bedtime. For mood control 30 tablet 0 08/23/2016 at Unknown time  . Prenat w/o A-FeCbn-Meth-FA-DHA (PRENATE MINI) 29-0.6-0.4-350 MG CAPS Take 1 capsule by mouth daily before breakfast. (Patient not taking: Reported on 08/24/2016) 90 capsule 3 Not Taking at Unknown time  . sertraline (ZOLOFT) 50 MG tablet Take 1 tablet (50 mg total) by mouth daily. For depression 30 tablet 0 08/23/2016 at Unknown time    Treatment Modalities: Medication Management, Group therapy, Case management,  1 to 1 session with clinician, Psychoeducation, Recreational therapy.  Patient Stressors: Loss of daughter to death Patient Strengths: Average or above average intelligence Communication skills Motivation for treatment/growth Supportive family/friends  Physician Treatment Plan  for Primary Diagnosis: Depression Long Term Goal(s): Improvement in symptoms so as ready for discharge Short Term Goals: Ability to verbalize feelings will improve Ability to disclose and discuss suicidal ideas Ability to demonstrate self-control will improve Ability to identify and develop effective coping behaviors will improve Ability to maintain clinical measurements within normal limits will improve Ability to disclose and discuss suicidal ideas Ability to demonstrate self-control will improve Ability to identify and develop effective coping behaviors will improve Ability to maintain clinical measurements  within normal limits will improve  Medication Management: Evaluate patient's response, side effects, and tolerance of medication regimen.  Therapeutic Interventions: 1 to 1 sessions, Unit Group sessions and Medication administration.  Evaluation of Outcomes: Adequate for Discharge  Physician Treatment Plan for Secondary Diagnosis: Principal Problem:   Bipolar affective disorder, depressed, severe ([place])  Long Term Goal(s): Improvement in symptoms so as ready for discharge  Short Term Goals: Ability to verbalize feelings will improve Ability to disclose and discuss suicidal ideas Ability to demonstrate self-control will improve Ability to identify and develop effective coping behaviors will improve Ability to maintain clinical measurements within normal limits will improve Ability to disclose and discuss suicidal ideas Ability to demonstrate self-control will improve Ability to identify and develop effective coping behaviors will improve Ability to maintain clinical measurements within normal limits will improve  Medication Management: Evaluate patient's response, side effects, and tolerance of medication regimen.  Therapeutic Interventions: 1 to 1 sessions, Unit Group sessions and Medication administration.  Evaluation of Outcomes: Adequate for Discharge  RN Treatment Plan for Primary Diagnosis: Depression Long Term Goal(s): Knowledge of disease and therapeutic regimen to maintain health will improve  Short Term Goals: Ability to disclose and discuss suicidal ideas and Compliance with prescribed medications will improve  Medication Management: RN will administer medications as ordered by provider, will assess and evaluate patient's response and provide education to patient for prescribed medication. RN will report any adverse and/or side effects to prescribing provider.  Therapeutic Interventions: 1 on 1 counseling sessions, Psychoeducation, Medication administration, Evaluate  responses to treatment, Monitor vital signs and CBGs as ordered, Perform/monitor CIWA, COWS, AIMS and Fall Risk screenings as ordered, Perform wound care treatments as ordered.  Evaluation of Outcomes: Adequate for Discharge  LCSW Treatment Plan for Primary Diagnosis: Depression Long Term Goal(s): Safe transition to appropriate next level of care at discharge, Engage patient in therapeutic group addressing interpersonal concerns. Short Term Goals: Engage patient in aftercare planning with referrals and resources, Increase ability to appropriately verbalize feelings, Identify triggers associated with mental health/substance abuse issues and Increase skills for wellness and recovery  Therapeutic Interventions: Assess for all discharge needs, 1 to 1 time with Social worker, Explore available resources and support systems, Assess for adequacy in community support network, Educate family and significant other(s) on suicide prevention, Complete Psychosocial Assessment, Interpersonal group therapy.  Evaluation of Outcomes: Adequate for Discharge  Progress in Treatment: Attending groups: Yes Participating in groups: Minimally Taking medication as prescribed: Yes, MD continues to assess for medication changes as needed Toleration medication: Yes, no side effects reported at this time Family/Significant other contact made: Yes, pt's grandmother contacted Patient understands diagnosis: Yes, AEB pt's willingness to participate in treatment Discussing patient identified problems/goals with staff: Yes Medical problems stabilized or resolved: Yes Denies suicidal/homicidal ideation: Yes Issues/concerns per patient self-inventory: None Other: N/A  New problem(s) identified: None identified at this time.   New Short Term/Long Term Goal(s): None identified at this time.   Discharge Plan or Barriers:  Pt will return home and follow up outpatient with [facility].  Reason for Continuation of  Hospitalization:  None identified at this time  Estimated Length of Stay: 0 days  Attendees: Patient: 08/29/2016 11:15 AM  Physician: Dr. Shea Evans 08/29/2016 11:15 AM  Nursing: Trinna Post, RN 08/29/2016 11:15 AM  RN Care Manager: Lars Pinks, RN 08/29/2016 11:15 AM  Social Worker: Matthew Saras, Elias-Fela Solis 08/29/2016 11:15 AM  Recreational Therapist:  08/29/2016 11:15 AM  Other: Lindell Spar, NP 08/29/2016 11:15 AM  Other:  08/29/2016 11:15 AM  Other: 08/29/2016 11:15 AM   Scribe for Treatment Team: Georga Kaufmann, MSW,LCSWA 08/29/2016 11:15 AM
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
## 2016-08-29 NOTE — Progress Notes (Signed)
Patient ID: Stephanie Mack, female   DOB: [birth year], 24 y.o.   MRN: [id number]  Discharge Note: Belongings returned to patient at time of discharge. Discharge instructions and medications were reviewed with patient. Patient verbalized understanding of both medications and discharge instructions. Patient discharged to lobby where her father was waiting for her. Q15 minute safety checks maintained until discharge. No distress noted upon discharge.

## 2016-08-29 NOTE — Progress Notes (Signed)
Recreation Therapy Notes  Date: 08/29/16 Time: 0930 Location: 300 Hall Dayroom  Group Topic: Stress Management  Goal Area(s) Addresses:  Patient will verbalize importance of using healthy stress management.  Patient will identify positive emotions associated with healthy stress management.   Intervention: Stress Management  Activity :  Guided Imagery.  LRT introduced the stress management concept of guided imagery.  LRT read a script to allow patients to engage in guided imagery.  Patients were to follow along to fully participate in the activity.  Education:  Stress Management, Discharge Planning.   Education Outcome: Acknowledges edcuation/In group clarification offered/Needs additional education  Clinical Observations/Feedback: Pt did not attend group.   Victorino Sparrow, LRT/CTRS         Victorino Sparrow A 08/29/2016 11:58 AM

## 2016-08-29 NOTE — Progress Notes (Signed)
This evening, pt reports she is doing well.  She denies SI/HI/AVH.  She says that today has been a good day.  She feels her meds are working well for her.  She says her stomach issues are almost resolved.  She reports that she had two BMs during the day that has eased her pain.  Her pulse continues to be elevated.  Providers are aware.  Pt voices no other needs or concerns.  Support and encouragement offered.  Discharge plans are in process.  Safety maintained with q15 minute checks.

## 2016-09-04 ENCOUNTER — Encounter (HOSPITAL_COMMUNITY): Payer: Self-pay | Admitting: Emergency Medicine

## 2016-09-04 ENCOUNTER — Emergency Department (HOSPITAL_COMMUNITY)
Admission: EM | Admit: 2016-09-04 | Discharge: 2016-09-04 | Disposition: A | Discharge status: Home / Self Care | Payer: Medicaid Other | Attending: Emergency Medicine | Admitting: Emergency Medicine

## 2016-09-04 DIAGNOSIS — R531 Weakness: Secondary | ICD-10-CM

## 2016-09-04 DIAGNOSIS — D573 Sickle-cell trait: Secondary | ICD-10-CM | POA: Diagnosis not present

## 2016-09-04 DIAGNOSIS — R42 Dizziness and giddiness: Secondary | ICD-10-CM | POA: Diagnosis not present

## 2016-09-04 LAB — CBC
HEMATOCRIT: 40.4 % (ref 36.0–46.0)
HEMOGLOBIN: 12.7 g/dL (ref 12.0–15.0)
MCH: 25.1 pg — ABNORMAL LOW (ref 26.0–34.0)
MCHC: 31.4 g/dL (ref 30.0–36.0)
MCV: 79.8 fL (ref 78.0–100.0)
Platelets: 232 10*3/uL (ref 150–400)
RBC: 5.06 MIL/uL (ref 3.87–5.11)
RDW: 13 % (ref 11.5–15.5)
WBC: 5.1 10*3/uL (ref 4.0–10.5)

## 2016-09-04 LAB — BASIC METABOLIC PANEL
Anion gap: 9 (ref 5–15)
BUN: <5 mg/dL — ABNORMAL LOW (ref 6–20)
CHLORIDE: 106 mmol/L (ref 101–111)
CO2: 23 mmol/L (ref 22–32)
Calcium: 9.6 mg/dL (ref 8.9–10.3)
Creatinine, Ser: 0.65 mg/dL (ref 0.44–1.00)
GFR calc non Af Amer: >60 mL/min (ref >60)
Glucose, Bld: 124 mg/dL — ABNORMAL HIGH (ref 65–99)
POTASSIUM: 3.6 mmol/L (ref 3.5–5.1)
SODIUM: 138 mmol/L (ref 135–145)

## 2016-09-04 NOTE — Discharge Instructions (Signed)
It was our pleasure to provide your ER care today - we hope that you feel better.  Your lab work looks good/normal.  Rest. Drink plenty of fluids.  Follow up with primary care doctor in the next few days.   Return to ER if worse, new symptoms, fevers, trouble breathing, fainting, other concern.

## 2016-09-04 NOTE — ED Triage Notes (Addendum)
Per EMS- pt discharged from [facility] on Friday, sent home on new medications. Pt reports new onset weakness starting in church. Pt is a diabetic, CBG 180. Vitals stable. Pt is on the depo shot for birth control.
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
## 2016-09-06 ENCOUNTER — Other Ambulatory Visit: Payer: Self-pay | Admitting: Obstetrics

## 2016-09-06 DIAGNOSIS — Z3042 Encounter for surveillance of injectable contraceptive: Secondary | ICD-10-CM

## 2016-09-07 ENCOUNTER — Ambulatory Visit: Payer: Self-pay

## 2016-09-09 ENCOUNTER — Other Ambulatory Visit: Payer: Self-pay

## 2016-09-15 ENCOUNTER — Other Ambulatory Visit (INDEPENDENT_AMBULATORY_CARE_PROVIDER_SITE_OTHER): Payer: Medicaid Other | Admitting: *Deleted

## 2016-09-15 VITALS — BP 127/86 | HR 71

## 2016-09-15 DIAGNOSIS — Z3042 Encounter for surveillance of injectable contraceptive: Secondary | ICD-10-CM

## 2016-09-15 MED ORDER — MEDROXYPROGESTERONE ACETATE 150 MG/ML IM SUSP
150.0000 mg | Freq: Once | INTRAMUSCULAR | Status: AC
  Administered 2016-09-15: 150 mg via INTRAMUSCULAR

## 2016-09-15 NOTE — Progress Notes (Signed)
Pt is in office for depo injection. Pt is past due for injection. Pt brought depo in to office with her today. Pt states last intercourse has been more than 2 weeks ago. UPT was negative in office today.  Depo then given per policy. Pt advised to RTO on 12/07/16 for next injection. Pt has no other concerns today.    Administrations This Visit    medroxyPROGESTERone (DEPO-PROVERA) injection 150 mg    Admin Date 09/15/2016 Action Given Dose 150 mg Route Intramuscular Administered By Valene Bors, CMA

## 2016-09-19 ENCOUNTER — Other Ambulatory Visit: Payer: Self-pay

## 2016-09-29 ENCOUNTER — Telehealth: Payer: Self-pay | Admitting: *Deleted

## 2016-09-29 ENCOUNTER — Other Ambulatory Visit: Payer: Self-pay | Admitting: Certified Nurse Midwife

## 2016-09-29 DIAGNOSIS — N946 Dysmenorrhea, unspecified: Secondary | ICD-10-CM

## 2016-09-29 MED ORDER — MELOXICAM 15 MG PO TABS: 15.0000 mg | ORAL_TABLET | Freq: Every day | ORAL | 5 refills | Status: DC

## 2016-09-29 NOTE — Telephone Encounter (Signed)
Please let her know that a new rx was sent.  Thank you.  Stephanie Mack

## 2016-09-29 NOTE — Telephone Encounter (Signed)
Fax from pharmacy requesting refill on Meloxicam 15mg  tab.  Please send refills if approved. Pt uses [facility]

## 2016-10-04 ENCOUNTER — Other Ambulatory Visit: Payer: Self-pay | Admitting: Certified Nurse Midwife

## 2016-10-04 DIAGNOSIS — N946 Dysmenorrhea, unspecified: Secondary | ICD-10-CM

## 2016-10-20 ENCOUNTER — Other Ambulatory Visit: Payer: Self-pay | Admitting: Certified Nurse Midwife

## 2016-10-20 ENCOUNTER — Other Ambulatory Visit: Payer: Self-pay | Admitting: Obstetrics

## 2016-10-20 DIAGNOSIS — N946 Dysmenorrhea, unspecified: Secondary | ICD-10-CM

## 2016-10-21 ENCOUNTER — Other Ambulatory Visit: Payer: Self-pay | Admitting: Certified Nurse Midwife

## 2016-11-10 ENCOUNTER — Other Ambulatory Visit: Payer: Self-pay | Admitting: Obstetrics

## 2016-11-10 DIAGNOSIS — N946 Dysmenorrhea, unspecified: Secondary | ICD-10-CM

## 2016-11-22 ENCOUNTER — Other Ambulatory Visit: Payer: Self-pay | Admitting: Obstetrics

## 2016-12-06 ENCOUNTER — Encounter (HOSPITAL_COMMUNITY): Payer: Self-pay | Admitting: Emergency Medicine

## 2016-12-06 ENCOUNTER — Emergency Department (HOSPITAL_COMMUNITY)
Admission: EM | Admit: 2016-12-06 | Discharge: 2016-12-06 | Disposition: A | Discharge status: Home / Self Care | Payer: Medicaid Other | Attending: Emergency Medicine | Admitting: Emergency Medicine

## 2016-12-06 DIAGNOSIS — Z5321 Procedure and treatment not carried out due to patient leaving prior to being seen by health care provider: Secondary | ICD-10-CM | POA: Diagnosis not present

## 2016-12-06 DIAGNOSIS — R109 Unspecified abdominal pain: Secondary | ICD-10-CM | POA: Diagnosis not present

## 2016-12-06 LAB — URINALYSIS, ROUTINE W REFLEX MICROSCOPIC
Bacteria, UA: NONE SEEN
Bilirubin Urine: NEGATIVE
GLUCOSE, UA: NEGATIVE mg/dL
Hgb urine dipstick: NEGATIVE
Ketones, ur: NEGATIVE mg/dL
Leukocytes, UA: NEGATIVE
Nitrite: NEGATIVE
PH: 6 (ref 5.0–8.0)
Protein, ur: 100 mg/dL — AB
SPECIFIC GRAVITY, URINE: 1.026 (ref 1.005–1.030)

## 2016-12-06 LAB — CBC
HCT: 37.7 % (ref 36.0–46.0)
Hemoglobin: 12.1 g/dL (ref 12.0–15.0)
MCH: 25.2 pg — AB (ref 26.0–34.0)
MCHC: 32.1 g/dL (ref 30.0–36.0)
MCV: 78.5 fL (ref 78.0–100.0)
PLATELETS: 247 10*3/uL (ref 150–400)
RBC: 4.8 MIL/uL (ref 3.87–5.11)
RDW: 13.6 % (ref 11.5–15.5)
WBC: 5.4 10*3/uL (ref 4.0–10.5)

## 2016-12-06 LAB — COMPREHENSIVE METABOLIC PANEL
ALK PHOS: 61 U/L (ref 38–126)
ALT: 15 U/L (ref 14–54)
AST: 19 U/L (ref 15–41)
Albumin: 4 g/dL (ref 3.5–5.0)
Anion gap: 10 (ref 5–15)
BUN: 9 mg/dL (ref 6–20)
CALCIUM: 9.5 mg/dL (ref 8.9–10.3)
CO2: 22 mmol/L (ref 22–32)
CREATININE: 0.8 mg/dL (ref 0.44–1.00)
Chloride: 106 mmol/L (ref 101–111)
GFR calc non Af Amer: >60 mL/min (ref >60)
Glucose, Bld: 123 mg/dL — ABNORMAL HIGH (ref 65–99)
Potassium: 3.8 mmol/L (ref 3.5–5.1)
SODIUM: 138 mmol/L (ref 135–145)
Total Bilirubin: 0.5 mg/dL (ref 0.3–1.2)
Total Protein: 7.4 g/dL (ref 6.5–8.1)

## 2016-12-06 LAB — I-STAT BETA HCG BLOOD, ED (MC, WL, AP ONLY): I-stat hCG, quantitative: <5 m[IU]/mL (ref <5)

## 2016-12-06 LAB — LIPASE, BLOOD: Lipase: 51 U/L (ref 11–51)

## 2016-12-06 NOTE — ED Triage Notes (Signed)
Pt c/o abdominal pain x 3-4 weeks. Denies n/v/d. Pt denies urinary symptoms.

## 2016-12-07 ENCOUNTER — Ambulatory Visit: Payer: Self-pay

## 2016-12-27 ENCOUNTER — Ambulatory Visit: Payer: Self-pay | Admitting: Nurse Practitioner

## 2017-01-03 ENCOUNTER — Telehealth: Payer: Self-pay

## 2017-01-03 NOTE — Telephone Encounter (Signed)
Returned call and advised pt that depo refills have expired because she is due for an annual, pt agreed and stated that she would call back to schedule.

## 2017-01-05 ENCOUNTER — Ambulatory Visit: Payer: Self-pay | Admitting: Physician Assistant

## 2017-01-31 ENCOUNTER — Inpatient Hospital Stay (HOSPITAL_COMMUNITY): Admission: RE | Admit: 2017-01-31 | Payer: Self-pay | Source: Ambulatory Visit

## 2017-01-31 ENCOUNTER — Other Ambulatory Visit (HOSPITAL_COMMUNITY)
Admission: RE | Admit: 2017-01-31 | Discharge: 2017-01-31 | Disposition: A | Discharge status: Home / Self Care | Payer: Medicaid Other | Source: Ambulatory Visit | Attending: Obstetrics | Admitting: Obstetrics

## 2017-01-31 ENCOUNTER — Encounter: Payer: Self-pay | Admitting: Obstetrics

## 2017-01-31 ENCOUNTER — Ambulatory Visit (INDEPENDENT_AMBULATORY_CARE_PROVIDER_SITE_OTHER): Payer: Medicaid Other | Admitting: Obstetrics

## 2017-01-31 VITALS — BP 129/84 | HR 83 | Ht 62.0 in | Wt 143.2 lb

## 2017-01-31 DIAGNOSIS — F909 Attention-deficit hyperactivity disorder, unspecified type: Secondary | ICD-10-CM | POA: Insufficient documentation

## 2017-01-31 DIAGNOSIS — Z01419 Encounter for gynecological examination (general) (routine) without abnormal findings: Secondary | ICD-10-CM

## 2017-01-31 DIAGNOSIS — Z7984 Long term (current) use of oral hypoglycemic drugs: Secondary | ICD-10-CM | POA: Insufficient documentation

## 2017-01-31 DIAGNOSIS — Z3202 Encounter for pregnancy test, result negative: Secondary | ICD-10-CM

## 2017-01-31 DIAGNOSIS — N898 Other specified noninflammatory disorders of vagina: Secondary | ICD-10-CM | POA: Diagnosis not present

## 2017-01-31 DIAGNOSIS — Z01411 Encounter for gynecological examination (general) (routine) with abnormal findings: Secondary | ICD-10-CM | POA: Diagnosis not present

## 2017-01-31 DIAGNOSIS — Z3042 Encounter for surveillance of injectable contraceptive: Secondary | ICD-10-CM

## 2017-01-31 DIAGNOSIS — Z0000 Encounter for general adult medical examination without abnormal findings: Secondary | ICD-10-CM | POA: Diagnosis not present

## 2017-01-31 DIAGNOSIS — F319 Bipolar disorder, unspecified: Secondary | ICD-10-CM | POA: Insufficient documentation

## 2017-01-31 DIAGNOSIS — F419 Anxiety disorder, unspecified: Secondary | ICD-10-CM | POA: Diagnosis not present

## 2017-01-31 DIAGNOSIS — Z79899 Other long term (current) drug therapy: Secondary | ICD-10-CM | POA: Insufficient documentation

## 2017-01-31 DIAGNOSIS — Z8632 Personal history of gestational diabetes: Secondary | ICD-10-CM | POA: Insufficient documentation

## 2017-01-31 LAB — POCT URINE PREGNANCY: Preg Test, Ur: NEGATIVE

## 2017-01-31 MED ORDER — MEDROXYPROGESTERONE ACETATE 150 MG/ML IM SUSP: 150.0000 mg | INTRAMUSCULAR | 4 refills | Status: DC
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
## 2017-01-31 NOTE — Progress Notes (Signed)
Patient is in the office for annual exam, last pap 07-28-15. Patient states that she forgot to take BP med today.

## 2017-02-02 ENCOUNTER — Telehealth: Payer: Self-pay | Admitting: Pediatrics

## 2017-02-02 LAB — CERVICOVAGINAL ANCILLARY ONLY
Bacterial vaginitis: NEGATIVE
CHLAMYDIA, DNA PROBE: NEGATIVE
Candida vaginitis: NEGATIVE
Neisseria Gonorrhea: NEGATIVE
TRICH (WINDOWPATH): NEGATIVE

## 2017-02-02 LAB — CYTOLOGY - PAP: Diagnosis: NEGATIVE

## 2017-02-02 NOTE — Telephone Encounter (Signed)
I contacted pharmacy.  They states we rec'd the fax d/t the pt trying to fill the rx early.  Rx can be filled 11/13.  I called pt and made her aware. She voiced understanding.

## 2017-02-10 ENCOUNTER — Ambulatory Visit: Payer: Self-pay | Admitting: Family Medicine

## 2017-02-13 ENCOUNTER — Encounter: Payer: Self-pay | Admitting: Family Medicine

## 2017-02-13 ENCOUNTER — Ambulatory Visit (INDEPENDENT_AMBULATORY_CARE_PROVIDER_SITE_OTHER): Payer: Medicaid Other | Admitting: Family Medicine

## 2017-02-13 VITALS — BP 132/80 | HR 82 | Temp 98.1°F | Resp 16 | Ht 62.0 in | Wt 146.0 lb

## 2017-02-13 DIAGNOSIS — E119 Type 2 diabetes mellitus without complications: Secondary | ICD-10-CM | POA: Diagnosis not present

## 2017-02-13 DIAGNOSIS — N912 Amenorrhea, unspecified: Secondary | ICD-10-CM

## 2017-02-13 DIAGNOSIS — G629 Polyneuropathy, unspecified: Secondary | ICD-10-CM

## 2017-02-13 HISTORY — DX: Type 2 diabetes mellitus without complications: E11.9

## 2017-02-13 LAB — BASIC METABOLIC PANEL WITH GFR
BUN: 8 mg/dL (ref 7–25)
CALCIUM: 9.4 mg/dL (ref 8.6–10.2)
CHLORIDE: 106 mmol/L (ref 98–110)
CO2: 26 mmol/L (ref 20–32)
Creat: 0.64 mg/dL (ref 0.50–1.10)
GFR, EST NON AFRICAN AMERICAN: 125 mL/min/{1.73_m2} (ref >=60)
GFR, Est African American: 145 mL/min/{1.73_m2} (ref >=60)
GLUCOSE: 119 mg/dL — AB (ref 65–99)
POTASSIUM: 3.9 mmol/L (ref 3.5–5.3)
Sodium: 139 mmol/L (ref 135–146)

## 2017-02-13 LAB — POCT URINALYSIS DIP (DEVICE)
Bilirubin Urine: NEGATIVE
GLUCOSE, UA: NEGATIVE mg/dL
Hgb urine dipstick: NEGATIVE
Ketones, ur: NEGATIVE mg/dL
LEUKOCYTES UA: NEGATIVE
NITRITE: NEGATIVE
PROTEIN: 30 mg/dL — AB
Specific Gravity, Urine: >=1.03 (ref 1.005–1.030)
UROBILINOGEN UA: 0.2 mg/dL (ref 0.0–1.0)
pH: 5.5 (ref 5.0–8.0)

## 2017-02-13 LAB — POCT URINE PREGNANCY: PREG TEST UR: NEGATIVE

## 2017-02-13 LAB — POCT GLYCOSYLATED HEMOGLOBIN (HGB A1C): HEMOGLOBIN A1C: 6.1

## 2017-02-13 MED ORDER — GABAPENTIN 100 MG PO CAPS: 100.0000 mg | ORAL_CAPSULE | Freq: Three times a day (TID) | ORAL | 3 refills | Status: DC

## 2017-02-13 MED ORDER — METFORMIN HCL 500 MG PO TABS: 500.0000 mg | ORAL_TABLET | Freq: Two times a day (BID) | ORAL | 5 refills | Status: DC

## 2017-02-13 NOTE — Progress Notes (Signed)
Stephanie Mack, 26 year old female with a history of type 2 diabetes presents complaining numbness and tingling to her lower extremities.  She states that numbness and tingling has been occurring frequently over the past month.  She says that symptoms have been interfering with sleep and typically worsens at night.  She has been consistently taking metformin thousand milligrams twice daily with meals for type 2 diabetes.  Her most recent hemoglobin A1c was 6.3.  She endorses periodic fatigue.  She denies polyuria, polydipsia, or polyphagia.  Past Medical History:  Diagnosis Date  . ADHD (attention deficit hyperactivity disorder)   . Anemia   . Anxiety   . Bipolar 1 disorder ([place])   . Chlamydia 05-31-10  . Chronic constipation   . Depression    depression  . GERD (gastroesophageal reflux disease)    with pregnancy  . Gestational diabetes    current pregnancy  . Gonorrhea contact, treated   . Headache   . Infection    UTI  . Pregnancy induced hypertension    previous pregnancy  . Pseudocyesis 2013   Seen in MAU for percieved FM, abd distension. Normal exam.   . Sickle cell trait ([place])   . Type 2 diabetes mellitus ([place]) 02/13/2017   Social History   Socioeconomic History  . Marital status: Single    Spouse name: Not on file  . Number of children: Not on file  . Years of education: Not on file  . Highest education level: Not on file  Social Needs  . Financial resource strain: Not on file  . Food insecurity - worry: Not on file  . Food insecurity - inability: Not on file  . Transportation needs - medical: Not on file  . Transportation needs - non-medical: Not on file  Occupational History  . Not on file  Tobacco Use  . Smoking status: Never Smoker  . Smokeless tobacco: Never Used  Substance and Sexual Activity  . Alcohol use: No    Alcohol/week: 0.0 oz  . Drug use: No  . Sexual activity: Not Currently    Partners: Male    Birth control/protection: None  Other Topics Concern   . Not on file  Social History Narrative   Single, one daughter born 2016   Student at [employer]   11/30/2015   Immunization History  Administered Date(s) Administered  . Influenza,inj,Quad PF,6+ Mos 01/09/2015, 02/11/2016  . MMR 01/10/2015  . Pneumococcal Polysaccharide-23 08/10/2016  . Rho (D) Immune Globulin 09/03/2012  . Tdap 01/08/2015  Review of Systems  Constitutional: Negative.   HENT: Negative.   Eyes: Negative for photophobia.  Respiratory: Negative.   Cardiovascular: Negative.   Gastrointestinal: Negative.  Negative for constipation.  Genitourinary: Negative.   Musculoskeletal: Negative.   Skin: Negative.   Neurological: Positive for tingling (burning and tingling to bilateral lower extremities).  Endo/Heme/Allergies: Negative for polydipsia.  Psychiatric/Behavioral: Negative.   Physical Exam  Constitutional: She is oriented to person, place, and time. She appears well-developed and well-nourished.  HENT:  Head: Normocephalic and atraumatic.  Right Ear: External ear normal.  Left Ear: External ear normal.  Nose: Nose normal.  Mouth/Throat: Oropharynx is clear and moist.  Eyes: Pupils are equal, round, and reactive to light.  Neck: Normal range of motion. Neck supple.  Cardiovascular: Normal rate, regular rhythm, normal heart sounds and intact distal pulses.  Respiratory: Effort normal and breath sounds normal.  GI: Soft. Bowel sounds are normal.  Musculoskeletal: Normal range of motion.  Neurological: She is alert and oriented  to person, place, and time. She has normal reflexes.  Skin: Skin is warm and dry.  Psychiatric: She has a normal mood and affect. Her behavior is normal. Judgment and thought content normal.  Plan  1. Type 2 diabetes mellitus without complication, without long-term current use of insulin (HCC) Hemoglobin A1c has decreased to 6.1, which is at goal, will decrease metformin to 500 mg twice daily with meals.  Recommend a carbohydrate modified  diet. - HgB W6O - BASIC METABOLIC PANEL WITH GFR - metFORMIN (GLUCOPHAGE) 500 MG tablet; Take 1 tablet (500 mg total) 2 (two) times daily with a meal by mouth.  Dispense: 60 tablet; Refill: 5  2. Neuropathy For neuropathy will start a trial of gabapentin 100 mg 3 times a day. - gabapentin (NEURONTIN) 100 MG capsule; Take 1 capsule (100 mg total) 3 (three) times daily by mouth.  Dispense: 90 capsule; Refill: 3  3. Amenorrhea  - POCT urine pregnancy  RTC: 3 months for DMII and neuropathy  Donia Pounds  MSN, FNP-C [facility] [address] [place], [place] [address] [phone]  The patient was given clear instructions to go to ER or return to medical center if symptoms do not improve, worsen or new problems develop. The patient verbalized understanding.

## 2017-02-13 NOTE — Patient Instructions (Addendum)
For neuropathy, will start a trial of Gabapentin 100 mg three times daily.   Your hemoglobin a1c is 6.1, will decrease Metformin to 500 mg twice daily  Your A1C goal is less than 7. Your fasting blood sugar  Upon awakening goal is between 110-140.  Your LDL  (bad cholesterol goal is less than 100 Blood pressure goal is <140/90.  Recommend a lowfat, low carbohydrate diet divided over 5-6 small meals, increase water intake to 6-8 glasses, and 150 minutes per week of cardiovascular exercise.   Take your medications as prescribed Make sure that you are familiar with each one of your medications and what they are used to treat.  If you are unsure of medications, please bring to follow up Will send referral for eye exam  Please keep your scheduled follow up appointment.     Peripheral Neuropathy Peripheral neuropathy is a type of nerve damage. It affects nerves that carry signals between the spinal cord and other parts of the body. These are called peripheral nerves. With peripheral neuropathy, one nerve or a group of nerves may be damaged. What are the causes? Many things can damage peripheral nerves. For some people with peripheral neuropathy, the cause is unknown. Some causes include:  Diabetes. This is the most common cause of peripheral neuropathy.  Injury to a nerve.  Pressure or stress on a nerve that lasts a long time.  Too little vitamin B. Alcoholism can lead to this.  Infections.  Autoimmune diseases, such as multiple sclerosis and systemic lupus erythematosus.  Inherited nerve diseases.  Some medicines, such as cancer drugs.  Toxic substances, such as lead and mercury.  Too little blood flowing to the legs.  Kidney disease.  Thyroid disease.  What are the signs or symptoms? Different people have different symptoms. The symptoms you have will depend on which of your nerves is damaged. Common symptoms include:  Loss of feeling (numbness) in the feet and  hands.  Tingling in the feet and hands.  Pain that burns.  Very sensitive skin.  Weakness.  Not being able to move a part of the body (paralysis).  Muscle twitching.  Clumsiness or poor coordination.  Loss of balance.  Not being able to control your bladder.  Feeling dizzy.  Sexual problems.  How is this diagnosed? Peripheral neuropathy is a symptom, not a disease. Finding the cause of peripheral neuropathy can be hard. To figure that out, your health care provider will take a medical history and do a physical exam. A neurological exam will also be done. This involves checking things affected by your brain, spinal cord, and nerves (nervous system). For example, your health care provider will check your reflexes, how you move, and what you can feel. Other types of tests may also be ordered, such as:  Blood tests.  A test of the fluid in your spinal cord.  Imaging tests, such as CT scans or an MRI.  Electromyography (EMG). This test checks the nerves that control muscles.  Nerve conduction velocity tests. These tests check how fast messages pass through your nerves.  Nerve biopsy. A small piece of nerve is removed. It is then checked under a microscope.  How is this treated?  Medicine is often used to treat peripheral neuropathy. Medicines may include: ? Pain-relieving medicines. Prescription or over-the-counter medicine may be suggested. ? Antiseizure medicine. This may be used for pain. ? Antidepressants. These also may help ease pain from neuropathy. ? Lidocaine. This is a numbing medicine. You might  wear a patch or be given a shot. ? Mexiletine. This medicine is typically used to help control irregular heart rhythms.  Surgery. Surgery may be needed to relieve pressure on a nerve or to destroy a nerve that is causing pain.  Physical therapy to help movement.  Assistive devices to help movement. Follow these instructions at home:  Only take over-the-counter or  prescription medicines as directed by your health care provider. Follow the instructions carefully for any given medicines. Do not take any other medicines without first getting approval from your health care provider.  If you have diabetes, work closely with your health care provider to keep your blood sugar under control.  If you have numbness in your feet: ? Check every day for signs of injury or infection. Watch for redness, warmth, and swelling. ? Wear padded socks and comfortable shoes. These help protect your feet.  Do not do things that put pressure on your damaged nerve.  Do not smoke. Smoking keeps blood from getting to damaged nerves.  Avoid or limit alcohol. Too much alcohol can cause a lack of B vitamins. These vitamins are needed for healthy nerves.  Develop a good support system. Coping with peripheral neuropathy can be stressful. Talk to a mental health specialist or join a support group if you are struggling.  Follow up with your health care provider as directed. Contact a health care provider if:  You have new signs or symptoms of peripheral neuropathy.  You are struggling emotionally from dealing with peripheral neuropathy.  You have a fever. Get help right away if:  You have an injury or infection that is not healing.  You feel very dizzy or begin vomiting.  You have chest pain.  You have trouble breathing. This information is not intended to replace advice given to you by your health care provider. Make sure you discuss any questions you have with your health care provider. Document Released: 03/11/2002 Document Revised: 08/27/2015 Document Reviewed: 11/26/2012 Elsevier Interactive Patient Education  2017 [employer].

## 2017-02-14 ENCOUNTER — Ambulatory Visit: Payer: Medicaid Other

## 2017-02-17 ENCOUNTER — Other Ambulatory Visit: Payer: Self-pay | Admitting: Obstetrics

## 2017-02-17 DIAGNOSIS — N946 Dysmenorrhea, unspecified: Secondary | ICD-10-CM

## 2017-02-28 ENCOUNTER — Other Ambulatory Visit: Payer: Self-pay | Admitting: Obstetrics

## 2017-02-28 DIAGNOSIS — N946 Dysmenorrhea, unspecified: Secondary | ICD-10-CM

## 2017-03-09 ENCOUNTER — Ambulatory Visit: Payer: Self-pay | Admitting: Obstetrics

## 2017-03-14 IMAGING — US US MFM OB LIMITED
1 series · 13 of 28 positions shown · non-contrast
Comparison: none

[Series 1: us ob mfm follow up · 36 acquisitions, 13 frames shown]
[im 2/36]
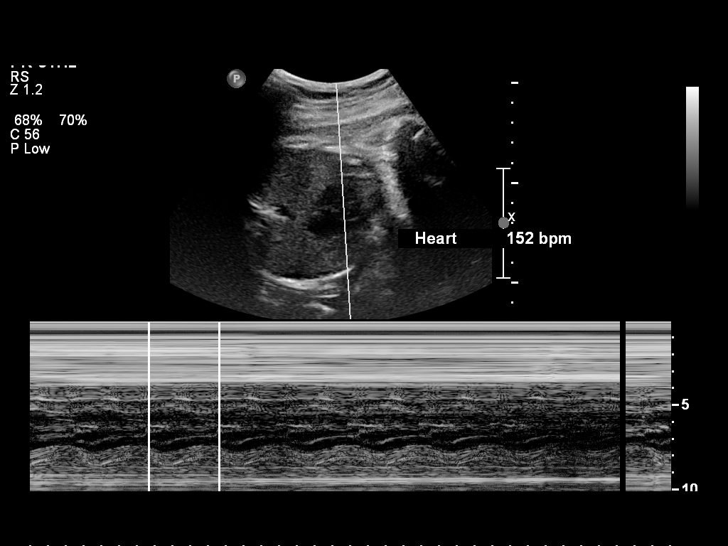
[im 4/36]
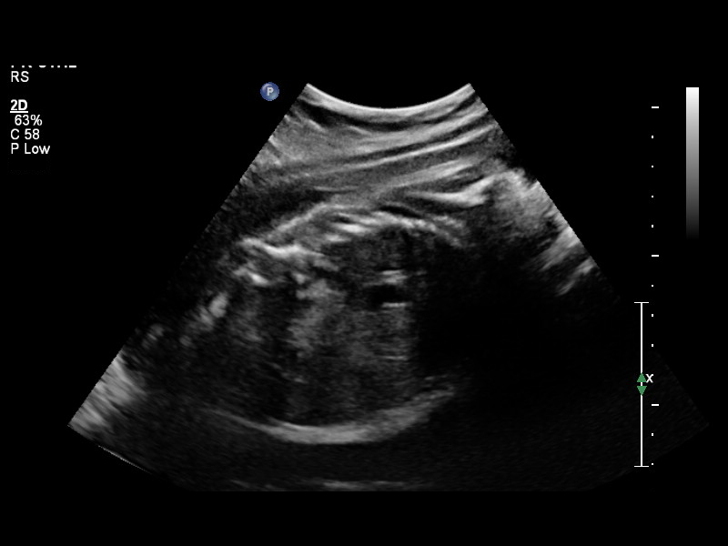
[im 7/36]
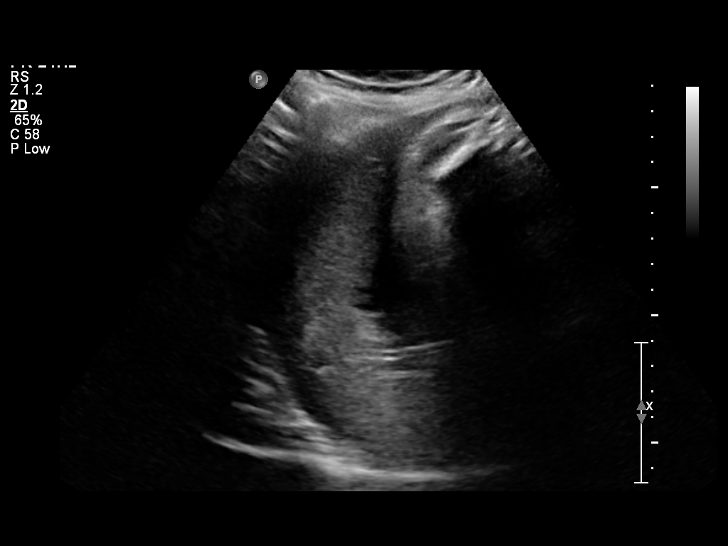
[im 10/36]
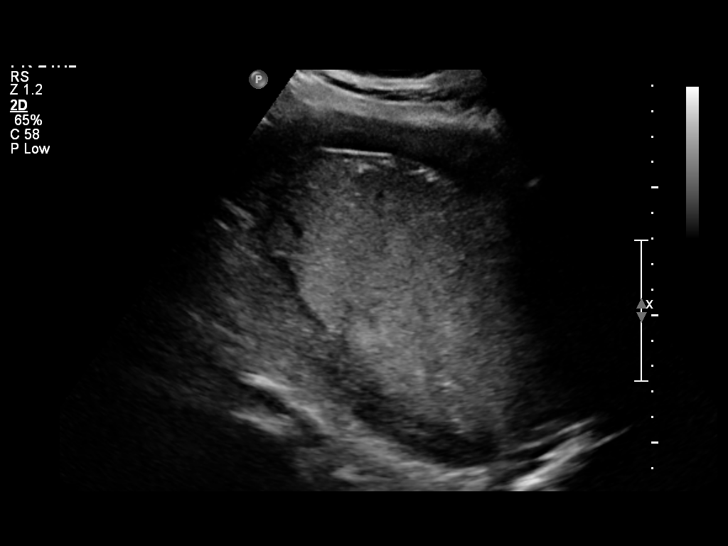
[im 12/36]
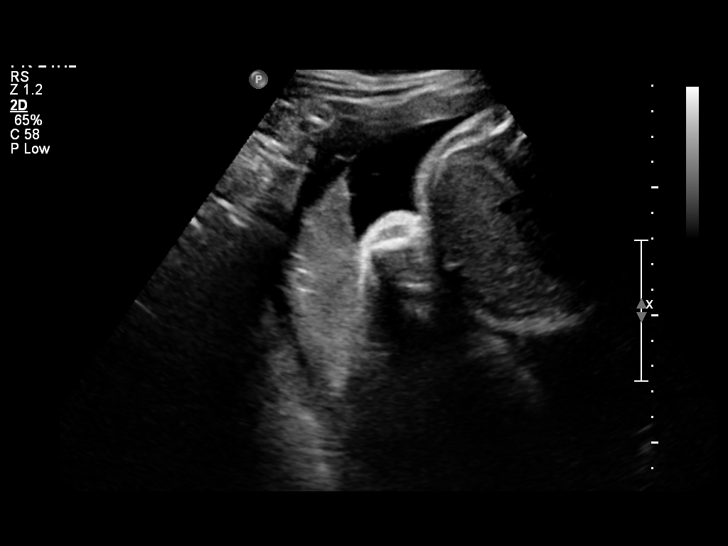
[im 15/36]
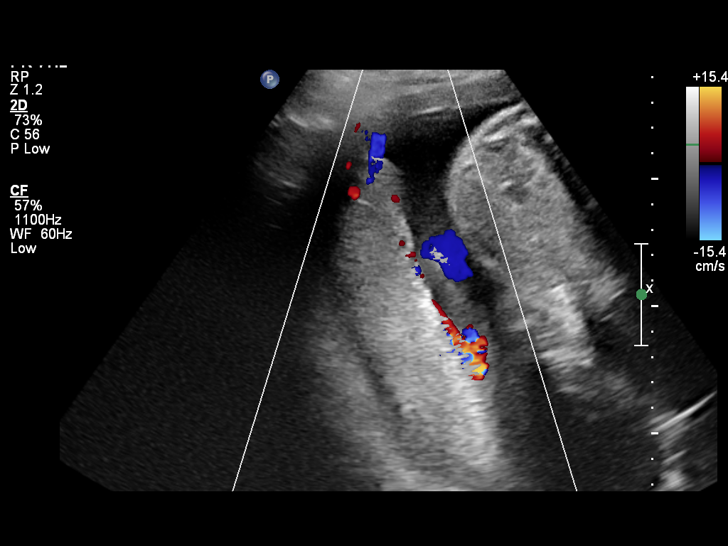
[im 19/36]
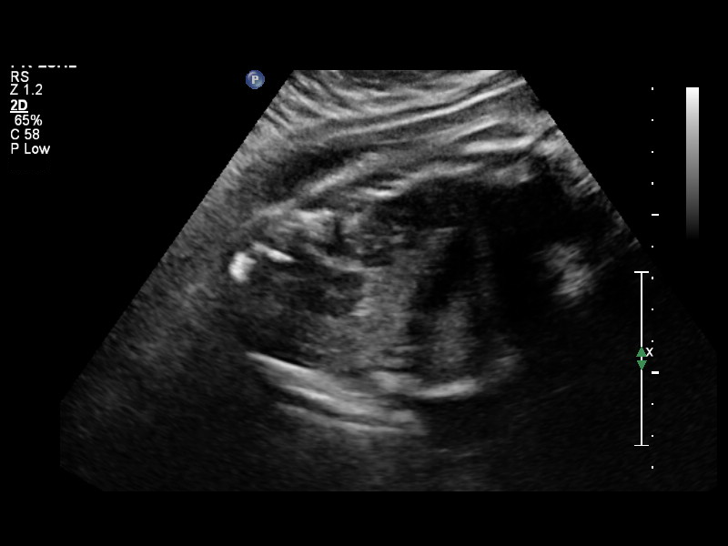
[im 21/36]
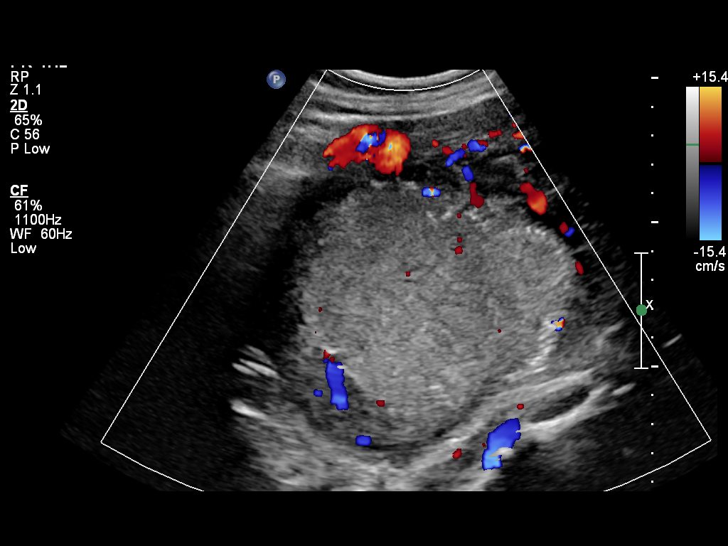
[im 24/36]
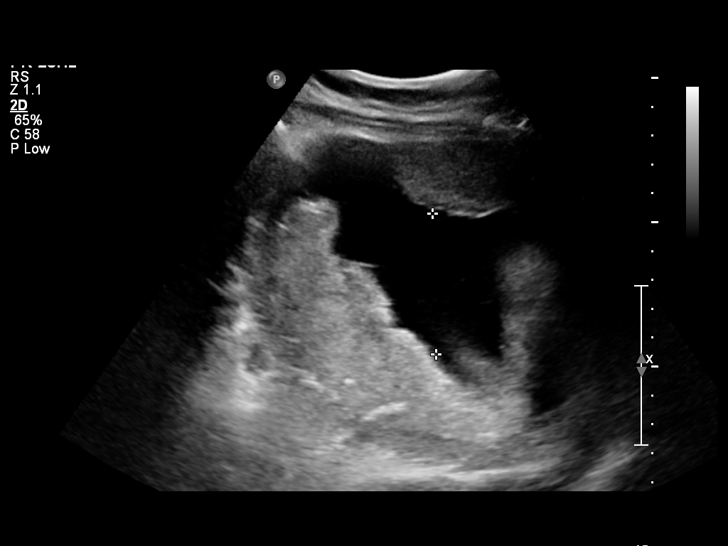
[im 26/36]
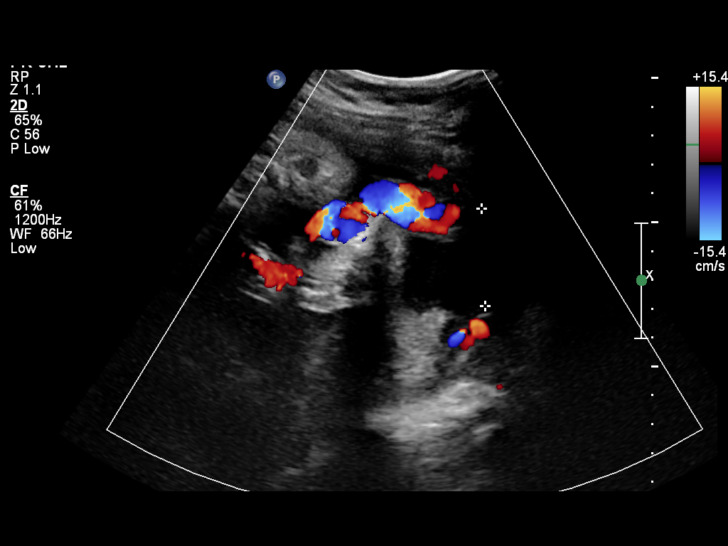
[im 29/36]
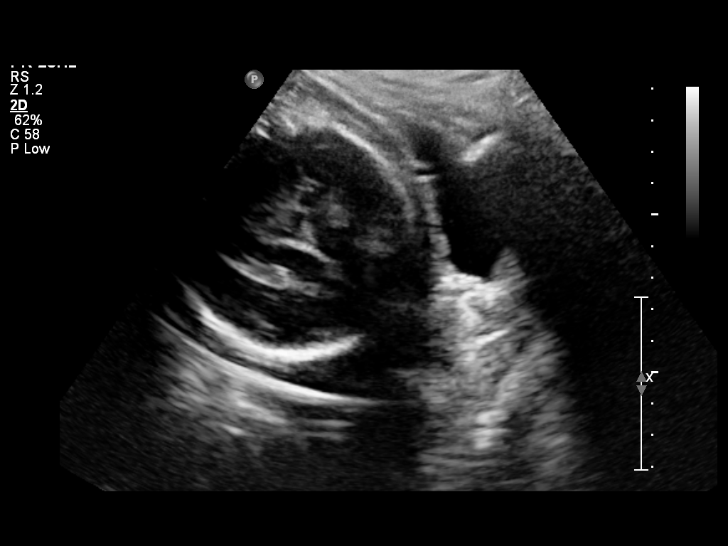
[im 32/36]
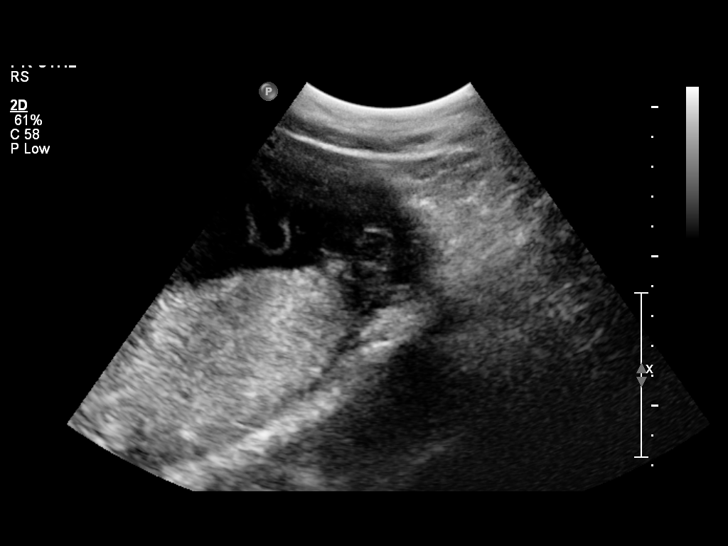
[im 34/36]
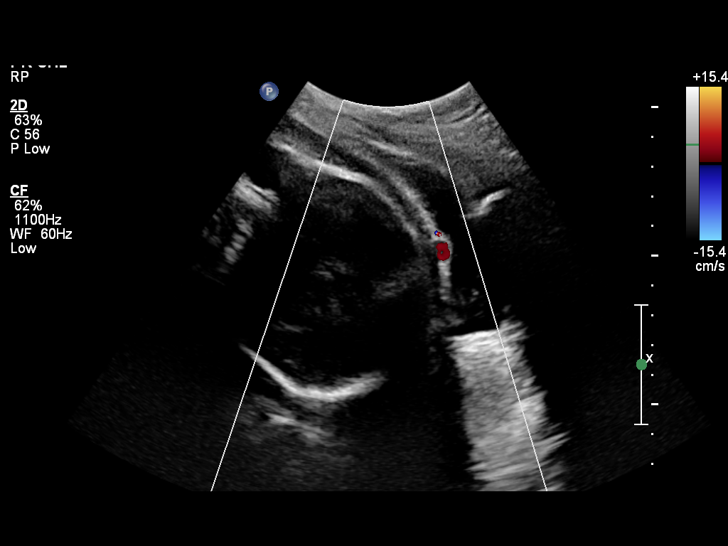

[13 of 28 positions shown; findings below may reference images not displayed]

OBSTETRICS REPORT
(Signed Final 12/01/2014 [DATE])

Service(s) Provided

US MFM OB LIMITED                                     76815.01
Indications

Poor obstetric history: Previous preterm delivery
(25 weeks, neonatal demise)
Poor obstetric history: Previous preeclampsia with
severe features requiring Naa at 25 weeks;
neonatal demise
History of cesarean delivery, currently pregnant
Rh negative
30 weeks gestation of pregnancy
Vaginal bleeding in pregnancy, third trimester
Fetal Evaluation

Num Of Fetuses:    1
Fetal Heart Rate:  152                          bpm
Cardiac Activity:  Observed
Presentation:      Cephalic
Placenta:          Posterior, above cervical
os
P. Cord            Previously Visualized
Insertion:

Comment:    No placental abruption visualized on todays US.

Amniotic Fluid
AFI FV:      Subjectively within normal limits
AFI Sum:     16.1    cm       58  %Tile     Larg Pckt:    4.96  cm
RUQ:   4.86    cm   RLQ:    4.96   cm    LUQ:   2.92    cm   LLQ:    3.36   cm
Gestational Age

LMP:           30w 4d        Date:  04/30/14                 EDD:   02/04/15
Best:          30w 4d     Det. By:  LMP  (04/30/14)          EDD:   02/04/15
Cervix Uterus Adnexa

Cervix:       Not visualized (advanced GA >82wks)

Adnexa:     No abnormality visualized.
Impression

SIUP at 50w3d (remote read only)
active fetus in cephalic presentation
no previa
amniotic fluid is gestational age appropriate
Recommendations

Recommend surveillance and management of patient as
clinically indicated.
I reviewed prior history and recommendation by my group.
Interval growth was recommended so if not already arranged
through your office, I recommend scheduling growth
evaluation in the next 2 weeks or so.

questions or concerns.

## 2017-03-21 ENCOUNTER — Telehealth: Payer: Self-pay | Admitting: Pediatrics

## 2017-03-21 NOTE — Telephone Encounter (Signed)
Pt should contact original pre scriber or PCP for refills on Zyprexa

## 2017-04-17 ENCOUNTER — Other Ambulatory Visit (HOSPITAL_COMMUNITY)
Admission: RE | Admit: 2017-04-17 | Discharge: 2017-04-17 | Disposition: A | Discharge status: Home / Self Care | Payer: Medicaid Other | Source: Ambulatory Visit | Attending: Obstetrics | Admitting: Obstetrics

## 2017-04-17 ENCOUNTER — Encounter: Payer: Self-pay | Admitting: Obstetrics

## 2017-04-17 ENCOUNTER — Ambulatory Visit: Payer: Medicaid Other | Admitting: Obstetrics

## 2017-04-17 VITALS — BP 145/85 | HR 90 | Wt 142.8 lb

## 2017-04-17 DIAGNOSIS — R300 Dysuria: Secondary | ICD-10-CM

## 2017-04-17 DIAGNOSIS — R109 Unspecified abdominal pain: Secondary | ICD-10-CM | POA: Insufficient documentation

## 2017-04-17 NOTE — Progress Notes (Signed)
Pt here complains of having abdominal cramping with no cycle. Pt is currently on depo.

## 2017-04-18 ENCOUNTER — Encounter: Payer: Self-pay | Admitting: Obstetrics

## 2017-04-18 ENCOUNTER — Other Ambulatory Visit: Payer: Self-pay | Admitting: Obstetrics

## 2017-04-18 DIAGNOSIS — N760 Acute vaginitis: Principal | ICD-10-CM

## 2017-04-18 DIAGNOSIS — B9689 Other specified bacterial agents as the cause of diseases classified elsewhere: Secondary | ICD-10-CM

## 2017-04-18 LAB — CERVICOVAGINAL ANCILLARY ONLY
BACTERIAL VAGINITIS: POSITIVE — AB
CHLAMYDIA, DNA PROBE: NEGATIVE
Candida vaginitis: NEGATIVE
NEISSERIA GONORRHEA: NEGATIVE
TRICH (WINDOWPATH): NEGATIVE

## 2017-04-18 MED ORDER — SECNIDAZOLE 2 G PO PACK: 1.0000 | PACK | Freq: Once | ORAL | 2 refills | Status: DC

## 2017-04-18 NOTE — Progress Notes (Signed)
Patient ID: Stephanie Mack, female   DOB: 1990-09-07, 27 y.o.   MRN: [id number]  Chief Complaint  Patient presents with  . Gynecologic Exam     HPI Stephanie Mack is a 27 y.o. female.  Lower abdominal pain.  On Depo for contraception. HPI  Past Medical History:  Diagnosis Date  . ADHD (attention deficit hyperactivity disorder)   . Anemia   . Anxiety   . Bipolar 1 disorder ([place])   . Chlamydia 05-31-10  . Chronic constipation   . Depression    depression  . GERD (gastroesophageal reflux disease)    with pregnancy  . Gestational diabetes    current pregnancy  . Gonorrhea contact, treated   . Headache   . Infection    UTI  . Pregnancy induced hypertension    previous pregnancy  . Pseudocyesis 2013   Seen in [facility] for percieved FM, abd distension. Normal exam.   . Sickle cell trait ([place])   . Type 2 diabetes mellitus ([place]) 02/13/2017    Past Surgical History:  Procedure Laterality Date  . CESAREAN SECTION N/A 09/01/2012   Procedure:  Primary cesarean section with delivery of baby girl at 22.  Apgars 1/1.  ;  Surgeon: Frederico Hamman, MD;  Location: [facility];  Service: Obstetrics;  Laterality: N/A;  . CESAREAN SECTION N/A 01/07/2015   Procedure: REPEAT CESAREAN SECTION;  Surgeon: Shelly Bombard, MD;  Location: [facility];  Service: Obstetrics;  Laterality: N/A;  . NASAL SEPTUM SURGERY    . WISDOM TOOTH EXTRACTION      Family History  Problem Relation Age of Onset  . Kidney disease Mother   . Hypertension Father   . Cancer Sister   . Asthma Brother   . Diabetes Maternal Grandfather   . Heart disease Neg Hx     Social History Social History   Tobacco Use  . Smoking status: Never Smoker  . Smokeless tobacco: Never Used  Substance Use Topics  . Alcohol use: No    Alcohol/week: 0.0 oz  . Drug use: No    No Known Allergies  Current Outpatient Medications  Medication Sig Dispense Refill  . ACCU-CHEK AVIVA PLUS test strip Check fasting blood sugar AND blood sugar  TWO HOURS AFTER each meal 100 each prn  . ACCU-CHEK SOFTCLIX LANCETS lancets Check blood sugar fasting AND TWO HOURS BEFORE each meal 100 each prn  . gabapentin (NEURONTIN) 100 MG capsule Take 1 capsule (100 mg total) 3 (three) times daily by mouth. 90 capsule 3  . ibuprofen (ADVIL,MOTRIN) 600 MG tablet TAKE ONE TABLET BY MOUTH EVERY 6 HOURS AS NEEDED for mild pain 30 tablet 5  . loratadine (CLARITIN) 10 MG tablet TAKE ONE TABLET BY MOUTH EVERY DAY 30 tablet 11  . medroxyPROGESTERone (DEPO-PROVERA) 150 MG/ML injection Inject 1 mL (150 mg total) into the muscle every 3 (three) months. 1 mL 4  . meloxicam (MOBIC) 15 MG tablet Take 1 tablet (15 mg total) by mouth daily as needed. 30 tablet 5  . metFORMIN (GLUCOPHAGE) 500 MG tablet Take 1 tablet (500 mg total) 2 (two) times daily with a meal by mouth. 60 tablet 5  . OLANZapine (ZYPREXA) 5 MG tablet Take 5 mg by mouth at bedtime.    . pantoprazole (PROTONIX) 20 MG tablet Take 1 tablet (20 mg total) by mouth daily. 30 tablet 0  . propranolol (INDERAL) 10 MG tablet Take 1 tablet (10 mg total) by mouth 2 (two) times daily. 30 tablet 0  .  sertraline (ZOLOFT) 25 MG tablet Take 3 tablets (75 mg total) by mouth daily. 30 tablet 0  . traZODone (DESYREL) 50 MG tablet Take 1 tablet (50 mg total) by mouth at bedtime as needed for sleep. 30 tablet 0   No current facility-administered medications for this visit.     Review of Systems Review of Systems Constitutional: negative for fatigue and weight loss Respiratory: negative for cough and wheezing Cardiovascular: negative for chest pain, fatigue and palpitations Gastrointestinal: positive for abdominal pain and negative for change in bowel habits Genitourinary:negative Integument/breast: negative for nipple discharge Musculoskeletal:negative for myalgias Neurological: negative for gait problems and tremors Behavioral/Psych: negative for abusive relationship, depression Endocrine: negative for temperature  intolerance      Blood pressure (!) 145/85, pulse 90, weight 142 lb 12.8 oz (64.8 kg).  Physical Exam Physical Exam General:   alert  Skin:   no rash or abnormalities  Lungs:   clear to auscultation bilaterally  Heart:   regular rate and rhythm, S1, S2 normal, no murmur, click, rub or gallop  Breasts:   normal without suspicious masses, skin or nipple changes or axillary nodes  Abdomen:  normal findings: no organomegaly, soft, non-tender and no hernia  Pelvis:  External genitalia: normal general appearance Urinary system: urethral meatus normal and bladder without fullness, nontender Vaginal: normal without tenderness, induration or masses Cervix: normal appearance Adnexa: normal bimanual exam Uterus: anteverted and non-tender, normal size    50% of 15 min visit spent on counseling and coordination of care.   Data Reviewed Labs  Assessment     1. Abdominal cramping Rx: - Cervicovaginal ancillary only  2. Dysuria Rx: - Urine Culture    Plan    Follow up prn   Orders Placed This Encounter  Procedures  . Urine Culture   No orders of the defined types were placed in this encounter.

## 2017-04-19 ENCOUNTER — Other Ambulatory Visit: Payer: Self-pay

## 2017-04-19 LAB — URINE CULTURE

## 2017-04-19 MED ORDER — SECNIDAZOLE 2 G PO PACK: 2.0000 g | PACK | Freq: Once | ORAL | 0 refills | Status: DC

## 2017-04-19 NOTE — Progress Notes (Signed)
Rx sent to Pharmacy

## 2017-04-27 ENCOUNTER — Other Ambulatory Visit: Payer: Self-pay | Admitting: *Deleted

## 2017-04-27 DIAGNOSIS — N760 Acute vaginitis: Principal | ICD-10-CM

## 2017-04-27 DIAGNOSIS — B9689 Other specified bacterial agents as the cause of diseases classified elsewhere: Secondary | ICD-10-CM

## 2017-04-27 MED ORDER — METRONIDAZOLE 0.75 % VA GEL: 1.0000 | Freq: Every day | VAGINAL | 0 refills | Status: AC

## 2017-04-27 NOTE — Progress Notes (Signed)
Pt called to office and states that she had taken Solosec as prescribed and it made her sick, vomited after taking.  Metrogel was sent to pharmacy per pt request due to intolerance of previous tx.

## 2017-05-03 ENCOUNTER — Telehealth: Payer: Self-pay | Admitting: Pediatrics

## 2017-05-03 DIAGNOSIS — N946 Dysmenorrhea, unspecified: Secondary | ICD-10-CM

## 2017-05-03 NOTE — Telephone Encounter (Signed)
Pt called nurse line stating she will not be getting "the shot" any more because she is still bleeding.  I tried to return the call, unable to leave voicemail-not set up.

## 2017-05-04 NOTE — Telephone Encounter (Signed)
I called and spoke with patient.  She states she no longer desires to be on birth control and will not be continuing Depo Provera injections.  She states she is going to try and get pregnant.  I advised I would cancel 05/08/17 injection appt and wished luck conceiving.  She voiced understanding, gratitude, and agreed with plan.

## 2017-05-08 ENCOUNTER — Ambulatory Visit: Payer: Self-pay

## 2017-05-17 ENCOUNTER — Encounter: Payer: Self-pay | Admitting: Family Medicine

## 2017-05-17 ENCOUNTER — Ambulatory Visit: Payer: Medicaid Other | Admitting: Family Medicine

## 2017-05-17 VITALS — BP 126/75 | HR 78 | Temp 98.1°F | Resp 14 | Ht 62.0 in | Wt 138.0 lb

## 2017-05-17 DIAGNOSIS — G629 Polyneuropathy, unspecified: Secondary | ICD-10-CM | POA: Diagnosis not present

## 2017-05-17 DIAGNOSIS — E119 Type 2 diabetes mellitus without complications: Secondary | ICD-10-CM | POA: Diagnosis not present

## 2017-05-17 LAB — POCT URINALYSIS DIP (DEVICE)
Bilirubin Urine: NEGATIVE
GLUCOSE, UA: NEGATIVE mg/dL
Ketones, ur: NEGATIVE mg/dL
LEUKOCYTES UA: NEGATIVE
Nitrite: NEGATIVE
Protein, ur: 100 mg/dL — AB
Specific Gravity, Urine: >=1.03 (ref 1.005–1.030)
Urobilinogen, UA: 0.2 mg/dL (ref 0.0–1.0)
pH: 5.5 (ref 5.0–8.0)

## 2017-05-17 LAB — POCT GLYCOSYLATED HEMOGLOBIN (HGB A1C): Hemoglobin A1C: 5.8

## 2017-05-17 MED ORDER — GABAPENTIN 300 MG PO CAPS: 300.0000 mg | ORAL_CAPSULE | Freq: Two times a day (BID) | ORAL | 1 refills | Status: DC

## 2017-05-17 MED ORDER — METFORMIN HCL 500 MG PO TABS: 500.0000 mg | ORAL_TABLET | Freq: Every day | ORAL | 5 refills | Status: DC

## 2017-05-17 NOTE — Progress Notes (Signed)
Subjective:    Patient ID: Stephanie Mack, female    DOB: 07-Jun-1990, 27 y.o.   MRN: [id number]  [name], a 27 year old female with a history of bipolar disorder, ADHD, and diabetes presents for follow up. Stephanie Mack is followed by [facility] monthly for ADHD and bipolar depression.   Diabetes  She presents for follow up of  type 2 diabetes mellitus. Her disease course has been improving. Stephanie Mack's most recent hemoglobin a1C was 6.1. Metformin 500 mg BID was continued and patient is taking consistently. Pertinent negatives for diabetes include no blurred vision, no chest pain, no fatigue, no foot ulcerations, no polydipsia, no polyphagia, no polyuria, no visual change, no weakness and no weight loss. Patient endorses worsening numbness and tingling to feet bilaterally.  Risk factors for coronary artery disease include sedentary lifestyle. She does not see a podiatrist.Eye exam is not current.    Past Medical History:  Diagnosis Date  . ADHD (attention deficit hyperactivity disorder)   . Anemia   . Anxiety   . Bipolar 1 disorder ([place])   . Chlamydia 05-31-10  . Chronic constipation   . Depression    depression  . GERD (gastroesophageal reflux disease)    with pregnancy  . Gestational diabetes    current pregnancy  . Gonorrhea contact, treated   . Headache   . Infection    UTI  . Pregnancy induced hypertension    previous pregnancy  . Pseudocyesis 2013   Seen in [facility]U for percieved FM, abd distension. Normal exam.   . Sickle cell trait (Stephanie Mack)   . Type 2 diabetes mellitus ([place]) 02/13/2017   Review of Systems  Constitutional: Positive for unexpected weight change (weight gain). Negative for fatigue, fever and weight loss.  HENT: Negative.   Eyes: Negative.  Negative for blurred vision.  Respiratory: Negative.   Cardiovascular: Negative for chest pain, palpitations and leg swelling.  Gastrointestinal: Negative.   Endocrine: Negative.  Negative for polydipsia, polyphagia and  polyuria.  Genitourinary: Negative.  Difficulty urinating:              Neurological: Negative for weakness. Bilateral lower extremity numbness/tingling Hematological: Negative.   Psychiatric/Behavioral: Negative.  Negative for behavioral problems and suicidal ideas.       Objective:   Physical Exam  Constitutional: She is oriented to person, place, and time. She appears well-developed and well-nourished.  HENT:  Head: Normocephalic and atraumatic.  Right Ear: External ear normal.  Left Ear: External ear normal.  Nose: Nose normal.  Eyes: Conjunctivae and EOM are normal. Pupils are equal, round, and reactive to light.  Neck: Normal range of motion. Neck supple.  Cardiovascular: Normal rate, regular rhythm, normal heart sounds and intact distal pulses.   Pulmonary/Chest: Effort normal and breath sounds normal.  Abdominal: Soft. Bowel sounds are normal. There is no tenderness.  Musculoskeletal: Normal range of motion.  Neurological: She is alert and oriented to person, place, and time. She has normal reflexes.  Skin: Skin is warm and dry.  Psychiatric: She has a normal mood and affect. Her behavior is normal. Judgment and thought content normal.     BP 126/75 (BP Location: Left Arm, Patient Position: Sitting, Cuff Size: Normal)   Pulse 78   Temp 98.1 F (36.7 C) (Oral)   Resp 14   Ht 5\' 2"  (1.575 m)   Wt 138 lb (62.6 kg)   SpO2 100%   BMI 25.24 kg/m   Assessment & Plan:  1. Type 2 diabetes  mellitus without complication, without long-term current use of insulin (HCC) Hemoglobin a1C has decreased to 5.8. Will decrease Metformin to 500 mg daily. Recommend a lowfat, low carbohydrate diet divided over 5-6 small meals, increase water intake to 6-8 glasses, and 150 minutes per week of cardiovascular exercise.   - HgB A1c - metFORMIN (GLUCOPHAGE) 500 MG tablet; Take 1 tablet (500 mg total) by mouth daily with breakfast.  Dispense: 60 tablet; Refill: 5 - Ambulatory referral to  Ophthalmology  2. Neuropathy Will increase gabapentin to 300 mg BID. - gabapentin (NEURONTIN) 300 MG capsule; Take 1 capsule (300 mg total) by mouth 2 (two) times daily.  Dispense: 60 capsule; Refill: 1   RTC: 6 months for DMII   Donia Pounds  MSN, FNP-C Patient [facility] [address] [place], [place] [id number] [phone]

## 2017-05-17 NOTE — Patient Instructions (Addendum)
Your hemoglobin A1c is 5.8, which is at goal.  Will reduce metformin to 500 mg once daily with breakfast.  For diabetic neuropathy we will increase gabapentin to 300 mg twice daily.  Continue low-fat, low carbohydrate diet divided over 5-6 small meals throughout the day.  We will follow-up in 6 months to repeat basic metabolic panel and hemoglobin A1c. Diabetes Mellitus and Nutrition When you have diabetes (diabetes mellitus), it is very important to have healthy eating habits because your blood sugar (glucose) levels are greatly affected by what you eat and drink. Eating healthy foods in the appropriate amounts, at about the same times every day, can help you:  Control your blood glucose.  Lower your risk of heart disease.  Improve your blood pressure.  Reach or maintain a healthy weight.  Every person with diabetes is different, and each person has different needs for a meal plan. Your health care provider may recommend that you work with a diet and nutrition specialist (dietitian) to make a meal plan that is best for you. Your meal plan may vary depending on factors such as:  The calories you need.  The medicines you take.  Your weight.  Your blood glucose, blood pressure, and cholesterol levels.  Your activity level.  Other health conditions you have, such as heart or kidney disease.  How do carbohydrates affect me? Carbohydrates affect your blood glucose level more than any other type of food. Eating carbohydrates naturally increases the amount of glucose in your blood. Carbohydrate counting is a method for keeping track of how many carbohydrates you eat. Counting carbohydrates is important to keep your blood glucose at a healthy level, especially if you use insulin or take certain oral diabetes medicines. It is important to know how many carbohydrates you can safely have in each meal. This is different for every person. Your dietitian can help you calculate how many carbohydrates  you should have at each meal and for snack. Foods that contain carbohydrates include:  Bread, cereal, rice, pasta, and crackers.  Potatoes and corn.  Peas, beans, and lentils.  Milk and yogurt.  Fruit and juice.  Desserts, such as cakes, cookies, ice cream, and candy.  How does alcohol affect me? Alcohol can cause a sudden decrease in blood glucose (hypoglycemia), especially if you use insulin or take certain oral diabetes medicines. Hypoglycemia can be a life-threatening condition. Symptoms of hypoglycemia (sleepiness, dizziness, and confusion) are similar to symptoms of having too much alcohol. If your health care provider says that alcohol is safe for you, follow these guidelines:  Limit alcohol intake to no more than 1 drink per day for nonpregnant women and 2 drinks per day for men. One drink equals 12 oz of beer, 5 oz of wine, or 1 oz of hard liquor.  Do not drink on an empty stomach.  Keep yourself hydrated with water, diet soda, or unsweetened iced tea.  Keep in mind that regular soda, juice, and other mixers may contain a lot of sugar and must be counted as carbohydrates.  What are tips for following this plan? Reading food labels  Start by checking the serving size on the label. The amount of calories, carbohydrates, fats, and other nutrients listed on the label are based on one serving of the food. Many foods contain more than one serving per package.  Check the total grams (g) of carbohydrates in one serving. You can calculate the number of servings of carbohydrates in one serving by dividing the total carbohydrates  by 15. For example, if a food has 30 g of total carbohydrates, it would be equal to 2 servings of carbohydrates.  Check the number of grams (g) of saturated and trans fats in one serving. Choose foods that have low or no amount of these fats.  Check the number of milligrams (mg) of sodium in one serving. Most people should limit total sodium intake to less  than 2,300 mg per day.  Always check the nutrition information of foods labeled as "low-fat" or "nonfat". These foods may be higher in added sugar or refined carbohydrates and should be avoided.  Talk to your dietitian to identify your daily goals for nutrients listed on the label. Shopping  Avoid buying canned, premade, or processed foods. These foods tend to be high in fat, sodium, and added sugar.  Shop around the outside edge of the grocery store. This includes fresh fruits and vegetables, bulk grains, fresh meats, and fresh dairy. Cooking  Use low-heat cooking methods, such as baking, instead of high-heat cooking methods like deep frying.  Cook using healthy oils, such as olive, canola, or sunflower oil.  Avoid cooking with butter, cream, or high-fat meats. Meal planning  Eat meals and snacks regularly, preferably at the same times every day. Avoid going long periods of time without eating.  Eat foods high in fiber, such as fresh fruits, vegetables, beans, and whole grains. Talk to your dietitian about how many servings of carbohydrates you can eat at each meal.  Eat 4-6 ounces of lean protein each day, such as lean meat, chicken, fish, eggs, or tofu. 1 ounce is equal to 1 ounce of meat, chicken, or fish, 1 egg, or 1/4 cup of tofu.  Eat some foods each day that contain healthy fats, such as avocado, nuts, seeds, and fish. Lifestyle   Check your blood glucose regularly.  Exercise at least 30 minutes 5 or more days each week, or as told by your health care provider.  Take medicines as told by your health care provider.  Do not use any products that contain nicotine or tobacco, such as cigarettes and e-cigarettes. If you need help quitting, ask your health care provider.  Work with a Social worker or diabetes educator to identify strategies to manage stress and any emotional and social challenges. What are some questions to ask my health care provider?  Do I need to meet with a  diabetes educator?  Do I need to meet with a dietitian?  What number can I call if I have questions?  When are the best times to check my blood glucose? Where to find more information:  American Diabetes Association: diabetes.org/food-and-fitness/food  Academy of Nutrition and Dietetics: PokerClues.dk  [employer] of Diabetes and Digestive and Kidney Diseases (NIH): ContactWire.be Summary  A healthy meal plan will help you control your blood glucose and maintain a healthy lifestyle.  Working with a diet and nutrition specialist (dietitian) can help you make a meal plan that is best for you.  Keep in mind that carbohydrates and alcohol have immediate effects on your blood glucose levels. It is important to count carbohydrates and to use alcohol carefully. This information is not intended to replace advice given to you by your health care provider. Make sure you discuss any questions you have with your health care provider. Document Released: 12/16/2004 Document Revised: 04/25/2016 Document Reviewed: 04/25/2016 Elsevier Interactive Patient Education  [employer].

## 2017-05-18 ENCOUNTER — Other Ambulatory Visit: Payer: Self-pay | Admitting: Obstetrics

## 2017-05-18 DIAGNOSIS — J301 Allergic rhinitis due to pollen: Secondary | ICD-10-CM

## 2017-05-21 ENCOUNTER — Emergency Department (HOSPITAL_COMMUNITY): Payer: Medicaid Other

## 2017-05-21 ENCOUNTER — Emergency Department (HOSPITAL_COMMUNITY)
Admission: EM | Admit: 2017-05-21 | Discharge: 2017-05-21 | Discharge status: Against Medical Advice | Payer: Medicaid Other | Attending: Emergency Medicine | Admitting: Emergency Medicine

## 2017-05-21 ENCOUNTER — Encounter (HOSPITAL_COMMUNITY): Payer: Self-pay

## 2017-05-21 DIAGNOSIS — Z79899 Other long term (current) drug therapy: Secondary | ICD-10-CM | POA: Insufficient documentation

## 2017-05-21 DIAGNOSIS — F909 Attention-deficit hyperactivity disorder, unspecified type: Secondary | ICD-10-CM | POA: Diagnosis not present

## 2017-05-21 DIAGNOSIS — I10 Essential (primary) hypertension: Secondary | ICD-10-CM | POA: Diagnosis not present

## 2017-05-21 DIAGNOSIS — Z8632 Personal history of gestational diabetes: Secondary | ICD-10-CM | POA: Diagnosis not present

## 2017-05-21 DIAGNOSIS — E119 Type 2 diabetes mellitus without complications: Secondary | ICD-10-CM | POA: Insufficient documentation

## 2017-05-21 DIAGNOSIS — R1030 Lower abdominal pain, unspecified: Secondary | ICD-10-CM | POA: Diagnosis not present

## 2017-05-21 LAB — CBC
HCT: 40.8 % (ref 36.0–46.0)
Hemoglobin: 12.8 g/dL (ref 12.0–15.0)
MCH: 25.5 pg — AB (ref 26.0–34.0)
MCHC: 31.4 g/dL (ref 30.0–36.0)
MCV: 81.3 fL (ref 78.0–100.0)
PLATELETS: 221 10*3/uL (ref 150–400)
RBC: 5.02 MIL/uL (ref 3.87–5.11)
RDW: 13.2 % (ref 11.5–15.5)
WBC: 5.3 10*3/uL (ref 4.0–10.5)

## 2017-05-21 LAB — COMPREHENSIVE METABOLIC PANEL
ALT: 13 U/L — AB (ref 14–54)
AST: 17 U/L (ref 15–41)
Albumin: 3.9 g/dL (ref 3.5–5.0)
Alkaline Phosphatase: 68 U/L (ref 38–126)
Anion gap: 9 (ref 5–15)
BUN: 7 mg/dL (ref 6–20)
CHLORIDE: 109 mmol/L (ref 101–111)
CO2: 22 mmol/L (ref 22–32)
CREATININE: 0.65 mg/dL (ref 0.44–1.00)
Calcium: 9.3 mg/dL (ref 8.9–10.3)
GFR calc Af Amer: >60 mL/min (ref >60)
GFR calc non Af Amer: >60 mL/min (ref >60)
Glucose, Bld: 95 mg/dL (ref 65–99)
Potassium: 3.6 mmol/L (ref 3.5–5.1)
SODIUM: 140 mmol/L (ref 135–145)
Total Bilirubin: 0.7 mg/dL (ref 0.3–1.2)
Total Protein: 7.2 g/dL (ref 6.5–8.1)

## 2017-05-21 LAB — URINALYSIS, ROUTINE W REFLEX MICROSCOPIC
BACTERIA UA: NONE SEEN
BILIRUBIN URINE: NEGATIVE
Glucose, UA: NEGATIVE mg/dL
Ketones, ur: NEGATIVE mg/dL
Leukocytes, UA: NEGATIVE
Nitrite: NEGATIVE
Protein, ur: NEGATIVE mg/dL
SPECIFIC GRAVITY, URINE: 1.025 (ref 1.005–1.030)
pH: 5 (ref 5.0–8.0)

## 2017-05-21 LAB — I-STAT BETA HCG BLOOD, ED (MC, WL, AP ONLY)

## 2017-05-21 NOTE — ED Provider Notes (Cosign Needed)
Patient placed in Quick Look pathway, seen and evaluated   Chief Complaint: lower abdominal pain. Pain with urination, diarrhea  HPI:   Pt reports she began having pain yesterday.  Pt reports she began having diarrhea.   ROS: Review of Systems  Constitutional: Negative for chills and fever.  Respiratory: Negative.   Cardiovascular: Negative.   Gastrointestinal: Positive for abdominal pain and diarrhea.  Genitourinary: Positive for dysuria.  Neurological: Negative for dizziness.  All other systems reviewed and are negative.   Physical Exam:   Gen: No distress  Neuro: Awake and Alert  Skin: Warm    Focused Exam: vitals normal. Resp  Normal effort Heart Regular rate tender suprapubic lower abdomen   Initiation of care has begun. The patient has been counseled on the process, plan, and necessity for staying for the completion/evaluation, and the remainder of the medical screening examination   Fransico Meadow, PA-C 05/21/17 1353

## 2017-05-21 NOTE — ED Notes (Signed)
Pt remains in waiting room. Updated on wait for treatment room. 
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
## 2017-05-21 NOTE — ED Triage Notes (Signed)
Patient complains of lower abdominal pain with diarrhea since yesterday. No nausea and no vomiting, denies dysuria

## 2017-05-21 NOTE — ED Notes (Signed)
Pt in room changing into gown.

## 2017-05-21 NOTE — ED Notes (Signed)
Pt refusing to wait for labs/ultrasound. States she will follow up with OB/GYN. Refusing to stay to talk to PA.

## 2017-05-23 ENCOUNTER — Ambulatory Visit: Payer: Self-pay | Admitting: Obstetrics

## 2017-05-31 ENCOUNTER — Ambulatory Visit: Payer: Self-pay | Admitting: Obstetrics & Gynecology

## 2017-06-12 ENCOUNTER — Other Ambulatory Visit: Payer: Self-pay | Admitting: Obstetrics

## 2017-06-12 DIAGNOSIS — R12 Heartburn: Secondary | ICD-10-CM

## 2017-06-23 ENCOUNTER — Inpatient Hospital Stay (HOSPITAL_COMMUNITY)
Admission: AD | Admit: 2017-06-23 | Discharge: 2017-06-23 | Disposition: A | Discharge status: Home / Self Care | Payer: Medicaid Other | Source: Ambulatory Visit | Attending: Obstetrics and Gynecology | Admitting: Obstetrics and Gynecology

## 2017-06-23 ENCOUNTER — Encounter (HOSPITAL_COMMUNITY): Payer: Self-pay | Admitting: *Deleted

## 2017-06-23 ENCOUNTER — Other Ambulatory Visit: Payer: Self-pay

## 2017-06-23 DIAGNOSIS — F419 Anxiety disorder, unspecified: Secondary | ICD-10-CM | POA: Insufficient documentation

## 2017-06-23 DIAGNOSIS — Z79899 Other long term (current) drug therapy: Secondary | ICD-10-CM | POA: Insufficient documentation

## 2017-06-23 DIAGNOSIS — Z7984 Long term (current) use of oral hypoglycemic drugs: Secondary | ICD-10-CM | POA: Insufficient documentation

## 2017-06-23 DIAGNOSIS — D573 Sickle-cell trait: Secondary | ICD-10-CM | POA: Insufficient documentation

## 2017-06-23 DIAGNOSIS — A6004 Herpesviral vulvovaginitis: Secondary | ICD-10-CM | POA: Insufficient documentation

## 2017-06-23 DIAGNOSIS — F319 Bipolar disorder, unspecified: Secondary | ICD-10-CM | POA: Diagnosis not present

## 2017-06-23 DIAGNOSIS — K5909 Other constipation: Secondary | ICD-10-CM | POA: Insufficient documentation

## 2017-06-23 DIAGNOSIS — E119 Type 2 diabetes mellitus without complications: Secondary | ICD-10-CM | POA: Diagnosis not present

## 2017-06-23 DIAGNOSIS — K219 Gastro-esophageal reflux disease without esophagitis: Secondary | ICD-10-CM | POA: Diagnosis not present

## 2017-06-23 DIAGNOSIS — B009 Herpesviral infection, unspecified: Secondary | ICD-10-CM | POA: Diagnosis not present

## 2017-06-23 DIAGNOSIS — R102 Pelvic and perineal pain: Secondary | ICD-10-CM | POA: Diagnosis present

## 2017-06-23 DIAGNOSIS — F909 Attention-deficit hyperactivity disorder, unspecified type: Secondary | ICD-10-CM | POA: Diagnosis not present

## 2017-06-23 LAB — URINALYSIS, ROUTINE W REFLEX MICROSCOPIC
Bilirubin Urine: NEGATIVE
GLUCOSE, UA: NEGATIVE mg/dL
Ketones, ur: NEGATIVE mg/dL
NITRITE: NEGATIVE
PH: 5 (ref 5.0–8.0)
Protein, ur: 30 mg/dL — AB
SPECIFIC GRAVITY, URINE: 1.028 (ref 1.005–1.030)

## 2017-06-23 LAB — WET PREP, GENITAL
CLUE CELLS WET PREP: NONE SEEN
Sperm: NONE SEEN
TRICH WET PREP: NONE SEEN
Yeast Wet Prep HPF POC: NONE SEEN

## 2017-06-23 LAB — POCT PREGNANCY, URINE: Preg Test, Ur: NEGATIVE

## 2017-06-23 MED ORDER — VALACYCLOVIR HCL 1 G PO TABS: 1000.0000 mg | ORAL_TABLET | Freq: Two times a day (BID) | ORAL | 1 refills | Status: AC

## 2017-06-23 MED ORDER — LIDOCAINE HCL 2 % EX GEL
1.0000 | Freq: Once | CUTANEOUS | Status: AC
Start: 2017-06-23 — End: 2017-06-23
  Administered 2017-06-23: 1 via TOPICAL
  Filled 2017-06-23: qty 5

## 2017-06-23 NOTE — MAU Note (Signed)
Pt presents for c/o vaginal pain that began 2 days ago.  Reports pain as aching, states it's difficult to sit.  Denies VB, reports vaginal discharge.  Denies vaginal odor. Unsure of LMP, hasn't taken HPT.  Reports missed period.

## 2017-06-23 NOTE — Discharge Instructions (Signed)

## 2017-06-23 NOTE — MAU Note (Signed)
Pt states she has had pain in her vagina since last night. She states there is a sore on the inside. Pt is Type 2 diabetic.

## 2017-06-23 NOTE — MAU Provider Note (Addendum)
Chief Complaint: Vaginal Pain   First Provider Initiated Contact with Patient 06/23/17 1448     SUBJECTIVE HPI: Stephanie Mack is a 27 y.o. [id number] who presents to maternity admissions reporting vaginal pain. She complains of a vaginal sore that has been there for the past 3 days. It is very painful, no itching, some discharge without color or odor. She has 1 sexual partner and does not use condoms. This causes dysuria but she denies hematuria, abdominal pain, fever or chills.   Past Medical History:  Diagnosis Date  . ADHD (attention deficit hyperactivity disorder)   . Anemia   . Anxiety   . Bipolar 1 disorder ([place])   . Chlamydia 05-31-10  . Chronic constipation   . Depression    depression  . GERD (gastroesophageal reflux disease)    with pregnancy  . Gestational diabetes    current pregnancy  . Gonorrhea contact, treated   . Headache   . Infection    UTI  . Pregnancy induced hypertension    previous pregnancy  . Pseudocyesis 2013   Seen in MAU for percieved FM, abd distension. Normal exam.   . Sickle cell trait ([place])   . Type 2 diabetes mellitus ([place]) 02/13/2017   Past Surgical History:  Procedure Laterality Date  . CESAREAN SECTION N/A 09/01/2012   Procedure:  Primary cesarean section with delivery of baby girl at 71.  Apgars 1/1.  ;  Surgeon: Frederico Hamman, MD;  Location: [facility];  Service: Obstetrics;  Laterality: N/A;  . CESAREAN SECTION N/A 01/07/2015   Procedure: REPEAT CESAREAN SECTION;  Surgeon: Shelly Bombard, MD;  Location: [facility];  Service: Obstetrics;  Laterality: N/A;  . NASAL SEPTUM SURGERY    . WISDOM TOOTH EXTRACTION     Social History   Socioeconomic History  . Marital status: Single    Spouse name: Not on file  . Number of children: Not on file  . Years of education: Not on file  . Highest education level: Not on file  Occupational History  . Not on file  Social Needs  . Financial resource strain: Not on file  . Food insecurity:   Worry: Not on file    Inability: Not on file  . Transportation needs:    Medical: Not on file    Non-medical: Not on file  Tobacco Use  . Smoking status: Never Smoker  . Smokeless tobacco: Never Used  Substance and Sexual Activity  . Alcohol use: No    Alcohol/week: 0.0 oz  . Drug use: No  . Sexual activity: Yes    Partners: Male    Birth control/protection: None  Lifestyle  . Physical activity:    Days per week: Not on file    Minutes per session: Not on file  . Stress: Not on file  Relationships  . Social connections:    Talks on phone: Not on file    Gets together: Not on file    Attends religious service: Not on file    Active member of club or organization: Not on file    Attends meetings of clubs or organizations: Not on file    Relationship status: Not on file  . Intimate partner violence:    Fear of current or ex partner: Not on file    Emotionally abused: Not on file    Physically abused: Not on file    Forced sexual activity: Not on file  Other Topics Concern  . Not on file  Social  History Narrative   Single, one daughter born 2016   Student at [employer]   11/30/2015   No current facility-administered medications on file prior to encounter.    Current Outpatient Medications on File Prior to Encounter  Medication Sig Dispense Refill  . ACCU-CHEK AVIVA PLUS test strip Check fasting blood sugar AND blood sugar TWO HOURS AFTER each meal 100 each prn  . ACCU-CHEK SOFTCLIX LANCETS lancets Check blood sugar fasting AND TWO HOURS BEFORE each meal 100 each prn  . gabapentin (NEURONTIN) 300 MG capsule Take 1 capsule (300 mg total) by mouth 2 (two) times daily. 60 capsule 1  . ibuprofen (ADVIL,MOTRIN) 600 MG tablet TAKE ONE TABLET BY MOUTH EVERY 6 HOURS AS NEEDED for mild pain 30 tablet 5  . loratadine (CLARITIN) 10 MG tablet TAKE ONE TABLET BY MOUTH EVERY DAY (Patient not taking: No sig reported) 30 tablet 11  . medroxyPROGESTERone (DEPO-PROVERA) 150 MG/ML injection Inject  1 mL (150 mg total) into the muscle every 3 (three) months. (Patient not taking: Reported on 05/21/2017) 1 mL 4  . metFORMIN (GLUCOPHAGE) 500 MG tablet Take 1 tablet (500 mg total) by mouth daily with breakfast. 60 tablet 5  . OLANZapine (ZYPREXA) 5 MG tablet Take 5 mg by mouth at bedtime.    . pantoprazole (PROTONIX) 20 MG tablet Take 1 tablet (20 mg total) by mouth daily. 30 tablet 0  . propranolol (INDERAL) 10 MG tablet Take 1 tablet (10 mg total) by mouth 2 (two) times daily. (Patient not taking: Reported on 05/21/2017) 30 tablet 0  . ranitidine (ZANTAC) 150 MG tablet TAKE ONE TABLET BY MOUTH TWICE DAILY 60 tablet 11  . sertraline (ZOLOFT) 25 MG tablet Take 3 tablets (75 mg total) by mouth daily. 30 tablet 0   No Known Allergies  ROS:  Review of Systems  Gastrointestinal: Negative for abdominal pain, constipation, diarrhea, nausea and vomiting.  Genitourinary: Positive for vaginal pain. Negative for pelvic pain.   I have reviewed patient's Past Medical Hx, Surgical Hx, Family Hx, Social Hx, medications and allergies.   Physical Exam   Patient Vitals for the past 24 hrs:  BP Temp Temp src Pulse SpO2 Height Weight  06/23/17 1350 115/76 98.6 F (37 C) Oral 77 98 % 5\' 1"  (1.549 m) 60.6 kg (133 lb 8 oz)   Physical Exam  Genitourinary:     Speculum and bimanual exam deferred   LAB RESULTS Results for orders placed or performed during the hospital encounter of 06/23/17 (from the past 24 hour(s))  Urinalysis, Routine w reflex microscopic     Status: Abnormal   Collection Time: 06/23/17  1:52 PM  Result Value Ref Range   Color, Urine YELLOW YELLOW   APPearance HAZY (A) CLEAR   Specific Gravity, Urine 1.028 1.005 - 1.030   pH 5.0 5.0 - 8.0   Glucose, UA NEGATIVE NEGATIVE mg/dL   Hgb urine dipstick SMALL (A) NEGATIVE   Bilirubin Urine NEGATIVE NEGATIVE   Ketones, ur NEGATIVE NEGATIVE mg/dL   Protein, ur 30 (A) NEGATIVE mg/dL   Nitrite NEGATIVE NEGATIVE   Leukocytes, UA TRACE (A)  NEGATIVE   RBC / HPF 0-5 0 - 5 RBC/hpf   WBC, UA 6-30 0 - 5 WBC/hpf   Bacteria, UA RARE (A) NONE SEEN   Squamous Epithelial / LPF 6-30 (A) NONE SEEN   Mucus PRESENT   Pregnancy, urine POC     Status: None   Collection Time: 06/23/17  2:08 PM  Result Value Ref Range  Preg Test, Ur NEGATIVE NEGATIVE       IMAGING No results found.  MAU Management/MDM: Orders Placed This Encounter  Procedures  . Wet prep, genital  . HSV Culture And Typing  . Urinalysis, Routine w reflex microscopic  . Pregnancy, urine POC    Meds ordered this encounter  Medications  . lidocaine (XYLOCAINE) 2 % jelly 1 application    ASSESSMENT and plan: Ms. Burbano is a 27OI [id number] with a painful vaginal sores whose appearances were  consistent with HSV on exam. Plan -Send swab for HSV PCR -G/C  and wet prep ordered -Prescription for Valtrex sent -Lidocaine jelly given for pain relief.  - Patient education on HSV   [place] Student  3/22/193:28 PM    CNM attestation:  I have seen and examined this patient and agree with above documentation in the medical student's note.   Stephanie Mack is a 37 y.o. [phone] at with vaginal pain, abnormal discharge.   PE: Patient Vitals for the past 24 hrs:  BP Temp Temp src Pulse SpO2 Height Weight  06/23/17 1350 115/76 98.6 F (37 C) Oral 77 98 % 5\' 1"  (1.549 m) 133 lb 8 oz (60.6 kg)   Gen: calm comfortable, NAD Resp: normal effort, no distress Heart: Regular rate Abd: Soft, NT, gravid, S=D  ROS, labs, PMH reviewed  Orders Placed This Encounter  Procedures  . Wet prep, genital  . HSV Culture And Typing  . Urinalysis, Routine w reflex microscopic  . Pregnancy, urine POC   Meds ordered this encounter  Medications  . lidocaine (XYLOCAINE) 2 % jelly 1 application    MDM -Herpes culture taken -GC chlamydia pending -wet prep neg Assessment: 1. Herpes infection     Plan: - Discharge home in stable condition.- -RX for Valtrex  given, explained how to take, implications for partner transmission as well as future pregnancies (will need suppression) - Return to maternity admissions symptoms worsen  Starr [place], CNM 06/23/2017 3:32 PM

## 2017-06-26 ENCOUNTER — Ambulatory Visit: Payer: Self-pay | Admitting: Obstetrics

## 2017-06-26 LAB — HSV CULTURE AND TYPING

## 2017-06-26 LAB — GC/CHLAMYDIA PROBE AMP (~~LOC~~) NOT AT ARMC
Chlamydia: NEGATIVE
Neisseria Gonorrhea: NEGATIVE

## 2017-07-11 ENCOUNTER — Ambulatory Visit (INDEPENDENT_AMBULATORY_CARE_PROVIDER_SITE_OTHER): Payer: Medicaid Other

## 2017-07-11 DIAGNOSIS — Z3200 Encounter for pregnancy test, result unknown: Secondary | ICD-10-CM

## 2017-07-11 DIAGNOSIS — Z3202 Encounter for pregnancy test, result negative: Secondary | ICD-10-CM

## 2017-07-11 LAB — POCT URINE PREGNANCY: Preg Test, Ur: NEGATIVE

## 2017-07-11 NOTE — Progress Notes (Signed)
Ms. Porto presents today for UPT. She has no unusual complaints. LMP: pt is unsure could not give a date for LMP.     OBJECTIVE: Appears well, in no apparent distress.  OB History    Gravida  3   Para  2   Term  0   Preterm  2   AB  1   Living  1     SAB  1   TAB  0   Ectopic  0   Multiple  0   Live Births  2          Home UPT Result: Positive  In-Office UPT result: Negative  I have reviewed the patient's medical, obstetrical, social, and family histories, and medications.   ASSESSMENT: Negative  pregnancy test  PLAN Prenatal care to be completed at: N/A F/U in 2 wks

## 2017-07-17 ENCOUNTER — Other Ambulatory Visit: Payer: Self-pay | Admitting: Obstetrics

## 2017-07-25 ENCOUNTER — Ambulatory Visit (INDEPENDENT_AMBULATORY_CARE_PROVIDER_SITE_OTHER): Payer: Medicaid Other

## 2017-07-25 DIAGNOSIS — N912 Amenorrhea, unspecified: Secondary | ICD-10-CM

## 2017-07-25 DIAGNOSIS — Z3202 Encounter for pregnancy test, result negative: Secondary | ICD-10-CM

## 2017-07-25 LAB — POCT URINE PREGNANCY: PREG TEST UR: NEGATIVE

## 2017-07-25 NOTE — Progress Notes (Signed)
I have reviewed the chart and agree with nursing staff's documentation of this patient's encounter.  Mora Bellman, MD 07/25/2017 4:59 PM

## 2017-07-25 NOTE — Progress Notes (Signed)
Pt here for pregnancy test. Test today is negative. Pt states that she has not had a cycle since her last depo shot in 06/2016. Pt advised to schedule a office visit to discuss amenorrhea.

## 2017-08-02 ENCOUNTER — Other Ambulatory Visit: Payer: Self-pay | Admitting: Obstetrics

## 2017-08-08 ENCOUNTER — Ambulatory Visit (INDEPENDENT_AMBULATORY_CARE_PROVIDER_SITE_OTHER): Payer: Medicaid Other | Admitting: Obstetrics

## 2017-08-08 ENCOUNTER — Encounter: Payer: Self-pay | Admitting: Obstetrics

## 2017-08-08 ENCOUNTER — Ambulatory Visit: Payer: Self-pay | Admitting: Obstetrics

## 2017-08-08 VITALS — BP 134/87 | HR 87 | Temp 97.7°F | Wt 138.0 lb

## 2017-08-08 DIAGNOSIS — N644 Mastodynia: Secondary | ICD-10-CM | POA: Diagnosis not present

## 2017-08-08 DIAGNOSIS — N912 Amenorrhea, unspecified: Secondary | ICD-10-CM

## 2017-08-08 DIAGNOSIS — Z3202 Encounter for pregnancy test, result negative: Secondary | ICD-10-CM | POA: Diagnosis not present

## 2017-08-08 LAB — POCT URINE PREGNANCY: Preg Test, Ur: NEGATIVE

## 2017-08-08 NOTE — Progress Notes (Signed)
Patient ID: Stephanie Mack, female   DOB: [birth year], 27 y.o.   MRN: [id number]  Chief Complaint  Patient presents with  . Breast Problem    HPI Stephanie Mack is a 27 y.o. female.  Pain in both breasts for the past month.  Has not had a period since stopping Depo Sept. 2018.  Complains of lethargy and increased appetite.  HPI  Past Medical History:  Diagnosis Date  . ADHD (attention deficit hyperactivity disorder)   . Anemia   . Anxiety   . Bipolar 1 disorder ([place])   . Chlamydia 05-31-10  . Chronic constipation   . Depression    depression  . GERD (gastroesophageal reflux disease)    with pregnancy  . Gestational diabetes    current pregnancy  . Gonorrhea contact, treated   . Headache   . Infection    UTI  . Pregnancy induced hypertension    previous pregnancy  . Pseudocyesis 2013   Seen in MAU for percieved FM, abd distension. Normal exam.   . Sickle cell trait ([place])   . Type 2 diabetes mellitus ([place]) 02/13/2017    Past Surgical History:  Procedure Laterality Date  . CESAREAN SECTION N/A 09/01/2012   Procedure:  Primary cesarean section with delivery of baby girl at 34.  Apgars 1/1.  ;  Surgeon: Frederico Hamman, MD;  Location: [facility];  Service: Obstetrics;  Laterality: N/A;  . CESAREAN SECTION N/A 01/07/2015   Procedure: REPEAT CESAREAN SECTION;  Surgeon: Shelly Bombard, MD;  Location: [facility];  Service: Obstetrics;  Laterality: N/A;  . NASAL SEPTUM SURGERY    . WISDOM TOOTH EXTRACTION      Family History  Problem Relation Age of Onset  . Kidney disease Mother   . Hypertension Father   . Cancer Sister   . Asthma Brother   . Diabetes Maternal Grandfather   . Heart disease Neg Hx     Social History Social History   Tobacco Use  . Smoking status: Never Smoker  . Smokeless tobacco: Never Used  Substance Use Topics  . Alcohol use: No    Alcohol/week: 0.0 oz  . Drug use: No    No Known Allergies  Current Outpatient Medications  Medication Sig  Dispense Refill  . metFORMIN (GLUCOPHAGE) 500 MG tablet Take 1 tablet (500 mg total) by mouth daily with breakfast. 60 tablet 5  . sertraline (ZOLOFT) 25 MG tablet Take 3 tablets (75 mg total) by mouth daily. 30 tablet 0  . ACCU-CHEK AVIVA PLUS test strip Check fasting blood sugar AND blood sugar TWO HOURS AFTER each meal (Patient not taking: Reported on 08/08/2017) 100 each prn  . ACCU-CHEK SOFTCLIX LANCETS lancets Check blood sugar fasting AND TWO HOURS BEFORE each meal (Patient not taking: Reported on 08/08/2017) 100 each prn  . DEPO-PROVERA 150 MG/ML SUSY INJECT 150 miligrams INTRAMUSCULARLY every THREE MONTHS (Patient not taking: Reported on 08/08/2017) 1 mL 3  . gabapentin (NEURONTIN) 300 MG capsule Take 1 capsule (300 mg total) by mouth 2 (two) times daily. (Patient not taking: Reported on 08/08/2017) 60 capsule 1  . ibuprofen (ADVIL,MOTRIN) 600 MG tablet TAKE ONE TABLET BY MOUTH EVERY 6 HOURS AS NEEDED for mild pain (Patient not taking: Reported on 08/08/2017) 30 tablet 5  . loratadine (CLARITIN) 10 MG tablet TAKE ONE TABLET BY MOUTH EVERY DAY (Patient not taking: No sig reported) 30 tablet 11  . medroxyPROGESTERone (DEPO-PROVERA) 150 MG/ML injection Inject 1 mL (150 mg total) into the muscle every  3 (three) months. (Patient not taking: Reported on 05/21/2017) 1 mL 4  . OLANZapine (ZYPREXA) 5 MG tablet Take 5 mg by mouth at bedtime.    . pantoprazole (PROTONIX) 20 MG tablet Take 1 tablet (20 mg total) by mouth daily. (Patient not taking: Reported on 08/08/2017) 30 tablet 0  . Prenat-FeCbn-FeAsp-Meth-FA-DHA (PRENATE MINI) 18-0.6-0.4-350 MG CAPS TAKE one tablet BY MOUTH EVERY DAY (Patient not taking: Reported on 08/08/2017) 30 capsule 11  . ranitidine (ZANTAC) 150 MG tablet TAKE ONE TABLET BY MOUTH TWICE DAILY (Patient not taking: Reported on 08/08/2017) 60 tablet 11   No current facility-administered medications for this visit.     Review of Systems Review of Systems Constitutional: negative for fatigue  and weight loss Respiratory: negative for cough and wheezing Cardiovascular: negative for chest pain, fatigue and palpitations Gastrointestinal: negative for abdominal pain and change in bowel habits Genitourinary:negative Integument/breast: negative for nipple discharge Musculoskeletal:negative for myalgias Neurological: negative for gait problems and tremors Behavioral/Psych: negative for abusive relationship, depression Endocrine: negative for temperature intolerance      Blood pressure 134/87, pulse 87, temperature 97.7 F (36.5 C), temperature source Oral, weight 138 lb (62.6 kg).  Physical Exam Physical Exam General:   alert  Skin:   no rash or abnormalities  Lungs:   clear to auscultation bilaterally  Heart:   regular rate and rhythm, S1, S2 normal, no murmur, click, rub or gallop  Breasts:   tender to palpation, bilaterally.  No masses.  Abdomen:  normal findings: no organomegaly, soft, non-tender and no hernia  Pelvis:  External genitalia: normal general appearance Urinary system: urethral meatus normal and bladder without fullness, nontender Vaginal: normal without tenderness, induration or masses Cervix: normal appearance Adnexa: normal bimanual exam Uterus: anteverted and non-tender, normal size    50% of 15 min visit spent on counseling and coordination of care.   Data Reviewed Labs  Assessment     1. Bilateral mastodynia - R/O pregnancy  2. Amenorrhea Rx: - POCT urine pregnancy - Comprehensive metabolic panel - TSH - CBC - hCG, serum, qualitative - Prolactin    Plan    Follow up in 2 weeks.  Review labs.  Will get Bilateral Breast Tomogram if all tests negative  Orders Placed This Encounter  Procedures  . Comprehensive metabolic panel  . TSH  . CBC  . hCG, serum, qualitative  . Prolactin  . POCT urine pregnancy   No orders of the defined types were placed in this encounter.    Shelly Bombard MD 08-08-2017

## 2017-08-08 NOTE — Progress Notes (Signed)
RGYN patient presents for problem visit.   CC: Bilateral breast pain x 1 month now 9/10x pain  Pt cannot recall last LMP.  UPT: NEG

## 2017-08-09 LAB — CBC
Hematocrit: 40.8 % (ref 34.0–46.6)
Hemoglobin: 13.3 g/dL (ref 11.1–15.9)
MCH: 26 pg — AB (ref 26.6–33.0)
MCHC: 32.6 g/dL (ref 31.5–35.7)
MCV: 80 fL (ref 79–97)
PLATELETS: 248 10*3/uL (ref 150–379)
RBC: 5.12 x10E6/uL (ref 3.77–5.28)
RDW: 14.5 % (ref 12.3–15.4)
WBC: 5.7 10*3/uL (ref 3.4–10.8)

## 2017-08-09 LAB — COMPREHENSIVE METABOLIC PANEL
A/G RATIO: 1.5 (ref 1.2–2.2)
ALT: 10 IU/L (ref 0–32)
AST: 11 IU/L (ref 0–40)
Albumin: 4.3 g/dL (ref 3.5–5.5)
Alkaline Phosphatase: 88 IU/L (ref 39–117)
BILIRUBIN TOTAL: 0.2 mg/dL (ref 0.0–1.2)
BUN/Creatinine Ratio: 14 (ref 9–23)
BUN: 8 mg/dL (ref 6–20)
CALCIUM: 9.1 mg/dL (ref 8.7–10.2)
CHLORIDE: 107 mmol/L — AB (ref 96–106)
CO2: 24 mmol/L (ref 20–29)
Creatinine, Ser: 0.56 mg/dL — ABNORMAL LOW (ref 0.57–1.00)
GFR, EST AFRICAN AMERICAN: 151 mL/min/{1.73_m2} (ref >59)
GFR, EST NON AFRICAN AMERICAN: 131 mL/min/{1.73_m2} (ref >59)
GLOBULIN, TOTAL: 2.8 g/dL (ref 1.5–4.5)
Glucose: 101 mg/dL — ABNORMAL HIGH (ref 65–99)
POTASSIUM: 4.2 mmol/L (ref 3.5–5.2)
SODIUM: 141 mmol/L (ref 134–144)
Total Protein: 7.1 g/dL (ref 6.0–8.5)

## 2017-08-09 LAB — HCG, SERUM, QUALITATIVE: HCG, BETA SUBUNIT, QUAL, SERUM: NEGATIVE m[IU]/mL (ref <6)

## 2017-08-09 LAB — PROLACTIN: Prolactin: 16.2 ng/mL (ref 4.8–23.3)

## 2017-08-09 LAB — TSH: TSH: 3.34 u[IU]/mL (ref 0.450–4.500)

## 2017-08-22 ENCOUNTER — Ambulatory Visit: Payer: Self-pay | Admitting: Obstetrics

## 2017-08-30 ENCOUNTER — Ambulatory Visit (INDEPENDENT_AMBULATORY_CARE_PROVIDER_SITE_OTHER): Payer: Medicaid Other | Admitting: Obstetrics

## 2017-08-30 ENCOUNTER — Ambulatory Visit (HOSPITAL_COMMUNITY)
Admission: EM | Admit: 2017-08-30 | Discharge: 2017-08-30 | Disposition: A | Discharge status: Home / Self Care | Payer: Medicaid Other

## 2017-08-30 ENCOUNTER — Encounter: Payer: Self-pay | Admitting: Obstetrics

## 2017-08-30 VITALS — BP 134/85 | HR 95 | Ht 62.0 in | Wt 135.8 lb

## 2017-08-30 DIAGNOSIS — N912 Amenorrhea, unspecified: Secondary | ICD-10-CM

## 2017-08-30 DIAGNOSIS — Z3202 Encounter for pregnancy test, result negative: Secondary | ICD-10-CM | POA: Diagnosis not present

## 2017-08-30 DIAGNOSIS — S60442A External constriction of right middle finger, initial encounter: Secondary | ICD-10-CM

## 2017-08-30 DIAGNOSIS — N644 Mastodynia: Secondary | ICD-10-CM | POA: Diagnosis not present

## 2017-08-30 LAB — POCT URINE PREGNANCY: PREG TEST UR: NEGATIVE

## 2017-08-30 MED ORDER — IBUPROFEN 800 MG PO TABS: 800.0000 mg | ORAL_TABLET | Freq: Three times a day (TID) | ORAL | 5 refills | Status: DC | PRN

## 2017-08-30 NOTE — ED Notes (Signed)
Pt requesting I throw the two ring parts in sharps. Witnessed by Dr. Joseph Art.

## 2017-08-30 NOTE — Progress Notes (Signed)
Patient ID: Stephanie Mack, female   DOB: October 31, 1990, 27 y.o.   MRN: [id number]  Chief Complaint  Patient presents with  . Follow-up    HPI Stephanie Mack is a 27 y.o. female.  Continues to have breast pain, bilaterally. HPI  Past Medical History:  Diagnosis Date  . ADHD (attention deficit hyperactivity disorder)   . Anemia   . Anxiety   . Bipolar 1 disorder ([place])   . Chlamydia 05-31-10  . Chronic constipation   . Depression    depression  . GERD (gastroesophageal reflux disease)    with pregnancy  . Gestational diabetes    current pregnancy  . Gonorrhea contact, treated   . Headache   . Infection    UTI  . Pregnancy induced hypertension    previous pregnancy  . Pseudocyesis 2013   Seen in MAU for percieved FM, abd distension. Normal exam.   . Sickle cell trait ([place])   . Type 2 diabetes mellitus ([place]) 02/13/2017    Past Surgical History:  Procedure Laterality Date  . CESAREAN SECTION N/A 09/01/2012   Procedure:  Primary cesarean section with delivery of baby girl at 28.  Apgars 1/1.  ;  Surgeon: Frederico Hamman, MD;  Location: [facility];  Service: Obstetrics;  Laterality: N/A;  . CESAREAN SECTION N/A 01/07/2015   Procedure: REPEAT CESAREAN SECTION;  Surgeon: Shelly Bombard, MD;  Location: [facility];  Service: Obstetrics;  Laterality: N/A;  . NASAL SEPTUM SURGERY    . WISDOM TOOTH EXTRACTION      Family History  Problem Relation Age of Onset  . Kidney disease Mother   . Hypertension Father   . Cancer Sister   . Asthma Brother   . Diabetes Maternal Grandfather   . Heart disease Neg Hx     Social History Social History   Tobacco Use  . Smoking status: Never Smoker  . Smokeless tobacco: Never Used  Substance Use Topics  . Alcohol use: No    Alcohol/week: 0.0 oz  . Drug use: No    No Known Allergies  Current Outpatient Medications  Medication Sig Dispense Refill  . ACCU-CHEK AVIVA PLUS test strip Check fasting blood sugar AND blood sugar TWO HOURS  AFTER each meal (Patient not taking: Reported on 08/08/2017) 100 each prn  . ACCU-CHEK SOFTCLIX LANCETS lancets Check blood sugar fasting AND TWO HOURS BEFORE each meal (Patient not taking: Reported on 08/08/2017) 100 each prn  . DEPO-PROVERA 150 MG/ML SUSY INJECT 150 miligrams INTRAMUSCULARLY every THREE MONTHS (Patient not taking: Reported on 08/08/2017) 1 mL 3  . gabapentin (NEURONTIN) 300 MG capsule Take 1 capsule (300 mg total) by mouth 2 (two) times daily. (Patient not taking: Reported on 08/08/2017) 60 capsule 1  . ibuprofen (ADVIL,MOTRIN) 600 MG tablet TAKE ONE TABLET BY MOUTH EVERY 6 HOURS AS NEEDED for mild pain (Patient not taking: Reported on 08/08/2017) 30 tablet 5  . ibuprofen (ADVIL,MOTRIN) 800 MG tablet Take 1 tablet (800 mg total) by mouth every 8 (eight) hours as needed. 30 tablet 5  . loratadine (CLARITIN) 10 MG tablet TAKE ONE TABLET BY MOUTH EVERY DAY (Patient not taking: No sig reported) 30 tablet 11  . medroxyPROGESTERone (DEPO-PROVERA) 150 MG/ML injection Inject 1 mL (150 mg total) into the muscle every 3 (three) months. (Patient not taking: Reported on 05/21/2017) 1 mL 4  . metFORMIN (GLUCOPHAGE) 500 MG tablet Take 1 tablet (500 mg total) by mouth daily with breakfast. 60 tablet 5  . OLANZapine (ZYPREXA) 5  MG tablet Take 5 mg by mouth at bedtime.    . pantoprazole (PROTONIX) 20 MG tablet Take 1 tablet (20 mg total) by mouth daily. (Patient not taking: Reported on 08/08/2017) 30 tablet 0  . Prenat-FeCbn-FeAsp-Meth-FA-DHA (PRENATE MINI) 18-0.6-0.4-350 MG CAPS TAKE one tablet BY MOUTH EVERY DAY (Patient not taking: Reported on 08/08/2017) 30 capsule 11  . ranitidine (ZANTAC) 150 MG tablet TAKE ONE TABLET BY MOUTH TWICE DAILY (Patient not taking: Reported on 08/08/2017) 60 tablet 11  . sertraline (ZOLOFT) 25 MG tablet Take 3 tablets (75 mg total) by mouth daily. 30 tablet 0   No current facility-administered medications for this visit.     Review of Systems Review of Systems Constitutional:  negative for fatigue and weight loss Respiratory: negative for cough and wheezing Cardiovascular: negative for chest pain, fatigue and palpitations Gastrointestinal: negative for abdominal pain and change in bowel habits Genitourinary:positive for no period for several months since receiving Depo Provera injection in April Integument/breast: negative for nipple discharge Musculoskeletal:negative for myalgias Neurological: negative for gait problems and tremors Behavioral/Psych: negative for abusive relationship, depression Endocrine: negative for temperature intolerance      Blood pressure 134/85, pulse 95, height 5\' 2"  (1.575 m), weight 135 lb 12.8 oz (61.6 kg).  Physical Exam Physical Exam General:   alert  Skin:   no rash or abnormalities  Lungs:   clear to auscultation bilaterally  Heart:   regular rate and rhythm, S1, S2 normal, no murmur, click, rub or gallop  Breasts:   normal without suspicious masses, skin or nipple changes or axillary nodes  50% of 15 min visit spent on counseling and coordination of care.   Data Reviewed UCG:  Negative Prolactin:  WNL's TSH:  WNL's  Assessment     1. Bilateral mastodynia Rx: - MM DIAG BREAST TOMO BILATERAL; Future - ibuprofen (ADVIL,MOTRIN) 800 MG tablet; Take 1 tablet (800 mg total) by mouth every 8 (eight) hours as needed.  Dispense: 30 tablet; Refill: 5  2. Amenorrhea Rx: - POCT urine pregnancy:  Negative    Plan    Follow up in 2 weeks  Orders Placed This Encounter  Procedures  . MM DIAG BREAST TOMO BILATERAL    Standing Status:   Future    Standing Expiration Date:   10/31/2018    Order Specific Question:   Reason for Exam (SYMPTOM  OR DIAGNOSIS REQUIRED)    Answer:   Mastodynia - Bilaterally    Order Specific Question:   Is the patient pregnant?    Answer:   No    Order Specific Question:   Preferred imaging location?    Answer:   [facility]  . POCT urine pregnancy   Meds ordered this encounter   Medications  . ibuprofen (ADVIL,MOTRIN) 800 MG tablet    Sig: Take 1 tablet (800 mg total) by mouth every 8 (eight) hours as needed.    Dispense:  30 tablet    Refill:  5    Shelly Bombard MD 08-30-2017

## 2017-08-30 NOTE — ED Notes (Signed)
Pt here for small ring removal on R middle finger. Ring removed with ring cutter very easily. NO break in skin. Finger is intact, pt denies pain.

## 2017-09-01 ENCOUNTER — Other Ambulatory Visit: Payer: Self-pay | Admitting: Obstetrics

## 2017-09-01 DIAGNOSIS — N946 Dysmenorrhea, unspecified: Secondary | ICD-10-CM

## 2017-09-05 ENCOUNTER — Ambulatory Visit: Payer: Self-pay | Admitting: Obstetrics

## 2017-09-07 ENCOUNTER — Inpatient Hospital Stay (HOSPITAL_COMMUNITY)
Admission: AD | Admit: 2017-09-07 | Discharge: 2017-09-07 | Disposition: A | Discharge status: Home / Self Care | Payer: Medicaid Other | Source: Ambulatory Visit | Attending: Obstetrics & Gynecology | Admitting: Obstetrics & Gynecology

## 2017-09-07 ENCOUNTER — Encounter (HOSPITAL_COMMUNITY): Payer: Self-pay

## 2017-09-07 DIAGNOSIS — Z3202 Encounter for pregnancy test, result negative: Secondary | ICD-10-CM | POA: Insufficient documentation

## 2017-09-07 DIAGNOSIS — N939 Abnormal uterine and vaginal bleeding, unspecified: Secondary | ICD-10-CM | POA: Diagnosis present

## 2017-09-07 DIAGNOSIS — N921 Excessive and frequent menstruation with irregular cycle: Secondary | ICD-10-CM

## 2017-09-07 LAB — URINALYSIS, ROUTINE W REFLEX MICROSCOPIC
BACTERIA UA: NONE SEEN
Bilirubin Urine: NEGATIVE
Glucose, UA: NEGATIVE mg/dL
KETONES UR: NEGATIVE mg/dL
Leukocytes, UA: NEGATIVE
Nitrite: NEGATIVE
PROTEIN: 30 mg/dL — AB
Specific Gravity, Urine: 1.028 (ref 1.005–1.030)
pH: 5 (ref 5.0–8.0)

## 2017-09-07 LAB — POCT PREGNANCY, URINE: PREG TEST UR: NEGATIVE

## 2017-09-07 NOTE — MAU Note (Signed)
Today started having vaginal bleeding that is now a bright orange and passing golf ball size clots.  Had a positive HPT in April.  LMP last year.  States she is 8-9 weeks now.  Did not start Nile.

## 2017-09-07 NOTE — Discharge Instructions (Signed)

## 2017-09-07 NOTE — MAU Provider Note (Signed)
History     CSN: [id number]  Arrival date and time: 09/07/17 9450   First Provider Initiated Contact with Patient 09/07/17 2013      Chief Complaint  Patient presents with  . Vaginal Bleeding    new onset vaginal bleeding "orange clots all day"  . Abdominal Pain   HPI Stephanie Mack is a 27 y.o. [phone] non pregnant patient who presents to MAU with chief complaint of vaginal bleeding with "bright orange clots" today. Patient told front desk staff that she is 8-[redacted] weeks pregnant.  States she was previously on depo provera for contraception but discontinued in December 2018. Denies condom use "because I'm with the same man".   States she does not desire STI screening.   Reports she was unaware that depo provera takes time to metabolize out of her system  Pertinent Gynecological History: Menses: irregular since discontinuing depot. states no period since April Bleeding: intermenstrual bleeding Contraception: none DES exposure: denies Blood transfusions: none Sexually transmitted diseases: currently at risk due to unprotected sex Previous GYN Procedures: N/A  Last mammogram: N/A age 74  Last pap: normal Date: 01/31/2017   Past Medical History:  Diagnosis Date  . ADHD (attention deficit hyperactivity disorder)   . Anemia   . Anxiety   . Bipolar 1 disorder ([place])   . Chlamydia 05-31-10  . Chronic constipation   . Depression    depression  . GERD (gastroesophageal reflux disease)    with pregnancy  . Gestational diabetes    current pregnancy  . Gonorrhea contact, treated   . Headache   . Infection    UTI  . Pregnancy induced hypertension    previous pregnancy  . Pseudocyesis 2013   Seen in MAU for percieved FM, abd distension. Normal exam.   . Sickle cell trait ([place])   . Type 2 diabetes mellitus ([place]) 02/13/2017    Past Surgical History:  Procedure Laterality Date  . CESAREAN SECTION N/A 09/01/2012   Procedure:  Primary cesarean section with delivery of baby girl  at 35.  Apgars 1/1.  ;  Surgeon: Frederico Hamman, MD;  Location: [facility];  Service: Obstetrics;  Laterality: N/A;  . CESAREAN SECTION N/A 01/07/2015   Procedure: REPEAT CESAREAN SECTION;  Surgeon: Shelly Bombard, MD;  Location: [facility];  Service: Obstetrics;  Laterality: N/A;  . NASAL SEPTUM SURGERY    . WISDOM TOOTH EXTRACTION      Family History  Problem Relation Age of Onset  . Kidney disease Mother   . Hypertension Father   . Cancer Sister   . Asthma Brother   . Diabetes Maternal Grandfather   . Heart disease Neg Hx     Social History   Tobacco Use  . Smoking status: Never Smoker  . Smokeless tobacco: Never Used  Substance Use Topics  . Alcohol use: No    Alcohol/week: 0.0 oz  . Drug use: No    Allergies: No Known Allergies  Medications Prior to Admission  Medication Sig Dispense Refill Last Dose  . ACCU-CHEK AVIVA PLUS test strip Check fasting blood sugar AND blood sugar TWO HOURS AFTER each meal (Patient not taking: Reported on 08/08/2017) 100 each prn Not Taking  . ACCU-CHEK SOFTCLIX LANCETS lancets Check blood sugar fasting AND TWO HOURS BEFORE each meal (Patient not taking: Reported on 08/08/2017) 100 each prn Not Taking  . DEPO-PROVERA 150 MG/ML SUSY INJECT 150 miligrams INTRAMUSCULARLY every THREE MONTHS (Patient not taking: Reported on 08/08/2017) 1 mL 3 Not Taking  .  gabapentin (NEURONTIN) 300 MG capsule Take 1 capsule (300 mg total) by mouth 2 (two) times daily. (Patient not taking: Reported on 08/08/2017) 60 capsule 1 Not Taking  . ibuprofen (ADVIL,MOTRIN) 600 MG tablet TAKE ONE TABLET BY MOUTH EVERY 6 HOURS AS NEEDED for mild pain 30 tablet 5   . ibuprofen (ADVIL,MOTRIN) 800 MG tablet Take 1 tablet (800 mg total) by mouth every 8 (eight) hours as needed. 30 tablet 5   . loratadine (CLARITIN) 10 MG tablet TAKE ONE TABLET BY MOUTH EVERY DAY (Patient not taking: No sig reported) 30 tablet 11 Not Taking  . medroxyPROGESTERone (DEPO-PROVERA) 150 MG/ML injection Inject 1  mL (150 mg total) into the muscle every 3 (three) months. (Patient not taking: Reported on 05/21/2017) 1 mL 4 Not Taking  . metFORMIN (GLUCOPHAGE) 500 MG tablet Take 1 tablet (500 mg total) by mouth daily with breakfast. 60 tablet 5 Taking  . OLANZapine (ZYPREXA) 5 MG tablet Take 5 mg by mouth at bedtime.   Not Taking  . pantoprazole (PROTONIX) 20 MG tablet Take 1 tablet (20 mg total) by mouth daily. (Patient not taking: Reported on 08/08/2017) 30 tablet 0 Not Taking  . Prenat-FeCbn-FeAsp-Meth-FA-DHA (PRENATE MINI) 18-0.6-0.4-350 MG CAPS TAKE one tablet BY MOUTH EVERY DAY (Patient not taking: Reported on 08/08/2017) 30 capsule 11 Not Taking  . ranitidine (ZANTAC) 150 MG tablet TAKE ONE TABLET BY MOUTH TWICE DAILY (Patient not taking: Reported on 08/08/2017) 60 tablet 11 Not Taking  . sertraline (ZOLOFT) 25 MG tablet Take 3 tablets (75 mg total) by mouth daily. 30 tablet 0 Taking    Review of Systems  Respiratory: Negative.   Gastrointestinal: Negative.  Negative for abdominal distention, abdominal pain, diarrhea, nausea and vomiting.  Endocrine: Negative for polydipsia, polyphagia and polyuria.  Genitourinary: Positive for vaginal bleeding and vaginal discharge. Negative for vaginal pain.  Neurological: Negative for dizziness, seizures, syncope, weakness, light-headedness and headaches.  Psychiatric/Behavioral: Negative.    Physical Exam   Blood pressure (!) 146/83, pulse 100, temperature 98 F (36.7 C), temperature source Oral, resp. rate 17, height 5\' 2"  (1.575 m), weight 137 lb 4 oz (62.3 kg), SpO2 100 %.  Physical Exam  Nursing note and vitals reviewed. Constitutional: She is oriented to person, place, and time. She appears well-developed and well-nourished.  HENT:  Head: Normocephalic and atraumatic.  Cardiovascular: Normal rate, regular rhythm, normal heart sounds and intact distal pulses.  Respiratory: Effort normal and breath sounds normal.  GI: Soft. Bowel sounds are normal. She  exhibits no distension and no mass. There is no tenderness. There is no rebound, no guarding and no CVA tenderness.  Genitourinary: Vagina normal and uterus normal. Cervix exhibits no discharge. Right adnexum displays no mass, no tenderness and no fullness. Left adnexum displays no mass, no tenderness and no fullness.  Genitourinary Comments:    Musculoskeletal: Normal range of motion.  Neurological: She is oriented to person, place, and time. She has normal reflexes.  Skin: Skin is warm and dry.  Psychiatric: She has a normal mood and affect. Her behavior is normal. Judgment and thought content normal.  Calm, conversational, appropriate for situation   - scant amount of pale brown mucus visible on pad worn by patient to MAU  MAU Course  Procedures None  MDM Orders Placed This Encounter  Procedures  . Urinalysis, Routine w reflex microscopic    Standing Status:   Standing    Number of Occurrences:   1  . Pregnancy, urine POC  Standing Status:   Standing    Number of Occurrences:   1    Assessment and Plan  A: - 27 y.o. female [id number] patient - Negative urine pregnancy test today, 08/30/2017, 08/08/17, and 07/25/2017 - Per patient report, has not received Depo Provera in 2019 - Risk for exposure to STI due to lack of condom use.   P: - Pt declines STI testing in MAU - Plans to restart Depo Provera - Pt to investigate LARCs for contraception (see AVS) - Pt to follow up with Dr. Jodi Mourning - Discharge home in stable condition  [name], [place] 09/07/2017, 8:50 PM

## 2017-09-08 ENCOUNTER — Telehealth: Payer: Self-pay | Admitting: Obstetrics

## 2017-09-08 ENCOUNTER — Inpatient Hospital Stay (HOSPITAL_COMMUNITY)
Admission: AD | Admit: 2017-09-08 | Discharge: 2017-09-08 | Disposition: A | Discharge status: Home / Self Care | Payer: Medicaid Other | Source: Ambulatory Visit | Attending: Obstetrics & Gynecology | Admitting: Obstetrics & Gynecology

## 2017-09-08 DIAGNOSIS — N939 Abnormal uterine and vaginal bleeding, unspecified: Secondary | ICD-10-CM | POA: Diagnosis not present

## 2017-09-08 DIAGNOSIS — N921 Excessive and frequent menstruation with irregular cycle: Secondary | ICD-10-CM

## 2017-09-08 LAB — URINALYSIS, ROUTINE W REFLEX MICROSCOPIC

## 2017-09-08 LAB — URINALYSIS, MICROSCOPIC (REFLEX)

## 2017-09-08 NOTE — MAU Provider Note (Signed)
History     CSN: [id number]  Arrival date and time: 09/08/17 1235   None     Chief Complaint  Patient presents with  . Vaginal Bleeding  . Abdominal Pain   HPI   Stephanie Mack is a 27 y.o. [phone] nonpregnant patient who presents for "I want the depo shot". States her vaginal bleeding is worse than when I cared for her in MAU yesterday.   OB History    Gravida  3   Para  2   Term  0   Preterm  2   AB  1   Living  1     SAB  1   TAB  0   Ectopic  0   Multiple  0   Live Births  2           Past Medical History:  Diagnosis Date  . ADHD (attention deficit hyperactivity disorder)   . Anemia   . Anxiety   . Bipolar 1 disorder ([place])   . Chlamydia 05-31-10  . Chronic constipation   . Depression    depression  . GERD (gastroesophageal reflux disease)    with pregnancy  . Gestational diabetes    current pregnancy  . Gonorrhea contact, treated   . Headache   . Infection    UTI  . Pregnancy induced hypertension    previous pregnancy  . Pseudocyesis 2013   Seen in MAU for percieved FM, abd distension. Normal exam.   . Sickle cell trait ([place])   . Type 2 diabetes mellitus ([place]) 02/13/2017    Past Surgical History:  Procedure Laterality Date  . CESAREAN SECTION N/A 09/01/2012   Procedure:  Primary cesarean section with delivery of baby girl at 64.  Apgars 1/1.  ;  Surgeon: Frederico Hamman, MD;  Location: [facility];  Service: Obstetrics;  Laterality: N/A;  . CESAREAN SECTION N/A 01/07/2015   Procedure: REPEAT CESAREAN SECTION;  Surgeon: Shelly Bombard, MD;  Location: [facility];  Service: Obstetrics;  Laterality: N/A;  . NASAL SEPTUM SURGERY    . WISDOM TOOTH EXTRACTION      Family History  Problem Relation Age of Onset  . Kidney disease Mother   . Hypertension Father   . Cancer Sister   . Asthma Brother   . Diabetes Maternal Grandfather   . Heart disease Neg Hx     Social History   Tobacco Use  . Smoking status: Never Smoker  .  Smokeless tobacco: Never Used  Substance Use Topics  . Alcohol use: No    Alcohol/week: 0.0 oz  . Drug use: No    Allergies: No Known Allergies  Medications Prior to Admission  Medication Sig Dispense Refill Last Dose  . ACCU-CHEK AVIVA PLUS test strip Check fasting blood sugar AND blood sugar TWO HOURS AFTER each meal (Patient not taking: Reported on 08/08/2017) 100 each prn Not Taking  . ACCU-CHEK SOFTCLIX LANCETS lancets Check blood sugar fasting AND TWO HOURS BEFORE each meal (Patient not taking: Reported on 08/08/2017) 100 each prn Not Taking  . DEPO-PROVERA 150 MG/ML SUSY INJECT 150 miligrams INTRAMUSCULARLY every THREE MONTHS (Patient not taking: Reported on 08/08/2017) 1 mL 3 Not Taking  . gabapentin (NEURONTIN) 300 MG capsule Take 1 capsule (300 mg total) by mouth 2 (two) times daily. (Patient not taking: Reported on 08/08/2017) 60 capsule 1 Not Taking  . ibuprofen (ADVIL,MOTRIN) 600 MG tablet TAKE ONE TABLET BY MOUTH EVERY 6 HOURS AS NEEDED for mild pain 30  tablet 5   . ibuprofen (ADVIL,MOTRIN) 800 MG tablet Take 1 tablet (800 mg total) by mouth every 8 (eight) hours as needed. 30 tablet 5   . loratadine (CLARITIN) 10 MG tablet TAKE ONE TABLET BY MOUTH EVERY DAY (Patient not taking: No sig reported) 30 tablet 11 Not Taking  . medroxyPROGESTERone (DEPO-PROVERA) 150 MG/ML injection Inject 1 mL (150 mg total) into the muscle every 3 (three) months. (Patient not taking: Reported on 05/21/2017) 1 mL 4 Not Taking  . metFORMIN (GLUCOPHAGE) 500 MG tablet Take 1 tablet (500 mg total) by mouth daily with breakfast. 60 tablet 5 Taking  . OLANZapine (ZYPREXA) 5 MG tablet Take 5 mg by mouth at bedtime.   Not Taking  . pantoprazole (PROTONIX) 20 MG tablet Take 1 tablet (20 mg total) by mouth daily. (Patient not taking: Reported on 08/08/2017) 30 tablet 0 Not Taking  . Prenat-FeCbn-FeAsp-Meth-FA-DHA (PRENATE MINI) 18-0.6-0.4-350 MG CAPS TAKE one tablet BY MOUTH EVERY DAY (Patient not taking: Reported on  08/08/2017) 30 capsule 11 Not Taking  . ranitidine (ZANTAC) 150 MG tablet TAKE ONE TABLET BY MOUTH TWICE DAILY (Patient not taking: Reported on 08/08/2017) 60 tablet 11 Not Taking  . sertraline (ZOLOFT) 25 MG tablet Take 3 tablets (75 mg total) by mouth daily. 30 tablet 0 Taking    Review of Systems  Respiratory: Negative for shortness of breath and stridor.   Genitourinary: Positive for vaginal bleeding. Negative for vaginal discharge and vaginal pain.  Neurological: Negative for dizziness, syncope, facial asymmetry, speech difficulty and weakness.  Psychiatric/Behavioral: Negative for hallucinations, self-injury, sleep disturbance and suicidal ideas. The patient is not nervous/anxious and is not hyperactive.   All other systems reviewed and are negative.    Physical Exam   Blood pressure 123/73, pulse 79, temperature 98.3 F (36.8 C), temperature source Oral, resp. rate 18, height 5\' 2"  (1.575 m), weight 136 lb (61.7 kg), last menstrual period 09/07/2017, SpO2 99 %.  Physical Exam  Constitutional: She is oriented to person, place, and time. She appears well-developed and well-nourished.  HENT:  Head: Normocephalic.  Neck: Normal range of motion.  GI: Soft. Bowel sounds are normal. She exhibits no distension and no mass. There is no tenderness. There is no rebound and no guarding.  Genitourinary:  Genitourinary Comments: Scant red blood on pad  Musculoskeletal: Normal range of motion.  Neurological: She is alert and oriented to person, place, and time. She has normal reflexes.  Skin: Skin is warm.  Psychiatric: She has a normal mood and affect. Her behavior is normal. Judgment and thought content normal.    MAU Course  Procedures None   MDM --See my MAU note from treatment 09/07/17  Assessment and Plan  -- Hemodynamically stable female --Declines STI testing, [facility] --Given phone number for women's clinic for depo provera  administration, pt to call for appt --Reviewed physical symptoms that would necessitate return to MAU vs. waiting for clinic appt --Reviewed bleeding precautions, normal flow when missing depo administrations --Discharge home in stable condition  [name], CNM 09/08/2017, 2:41 PM

## 2017-09-08 NOTE — Discharge Instructions (Signed)

## 2017-09-08 NOTE — MAU Note (Signed)
Pt reports she was here last pm for vaginal bleeding and sent home , states when she got up today she was having heavy bleeding again and cramping.

## 2017-09-08 NOTE — Telephone Encounter (Signed)
Pt went to [facility] for menorrhagia yesterday. Pt has missed depo shots and appts here. Gave pt appt Monday 09/11/17 at 9:30 with Jodi Mourning. Pt wants to know if we can call in her dep to the pharmacy so she can get the depo at her appt Monday.

## 2017-09-11 ENCOUNTER — Encounter: Payer: Self-pay | Admitting: Obstetrics

## 2017-09-11 ENCOUNTER — Ambulatory Visit (INDEPENDENT_AMBULATORY_CARE_PROVIDER_SITE_OTHER): Payer: Medicaid Other | Admitting: Obstetrics

## 2017-09-11 VITALS — BP 131/90 | HR 67 | Wt 132.4 lb

## 2017-09-11 DIAGNOSIS — Z3042 Encounter for surveillance of injectable contraceptive: Secondary | ICD-10-CM

## 2017-09-11 DIAGNOSIS — Z3202 Encounter for pregnancy test, result negative: Secondary | ICD-10-CM | POA: Diagnosis not present

## 2017-09-11 DIAGNOSIS — Z30013 Encounter for initial prescription of injectable contraceptive: Secondary | ICD-10-CM

## 2017-09-11 DIAGNOSIS — Z3009 Encounter for other general counseling and advice on contraception: Secondary | ICD-10-CM

## 2017-09-11 DIAGNOSIS — R300 Dysuria: Secondary | ICD-10-CM

## 2017-09-11 LAB — POCT URINE PREGNANCY: PREG TEST UR: NEGATIVE

## 2017-09-11 MED ORDER — MEDROXYPROGESTERONE ACETATE 150 MG/ML IM SUSP: 150.0000 mg | INTRAMUSCULAR | 4 refills | Status: DC

## 2017-09-11 MED ORDER — MEDROXYPROGESTERONE ACETATE 150 MG/ML IM SUSP
150.0000 mg | Freq: Once | INTRAMUSCULAR | Status: AC
  Administered 2017-09-11: 150 mg via INTRAMUSCULAR
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
## 2017-09-11 NOTE — Addendum Note (Signed)
Addended by: Marquette Old on: 09/11/2017 11:59 AM   Modules accepted: Orders

## 2017-09-11 NOTE — Progress Notes (Signed)
RGYN patient presents for F/U visit from  hospital on 09/07/2017 and 09/08/2017.  UPT was Negative on 09/07/2017.  Pt confirms she has been having unprotected intercourse.  Pt wants Depo. (Pt went to pharmacy and states Depo was not there. )

## 2017-09-13 ENCOUNTER — Ambulatory Visit: Payer: Self-pay | Admitting: Obstetrics

## 2017-09-13 LAB — URINE CULTURE

## 2017-09-15 ENCOUNTER — Other Ambulatory Visit: Payer: Self-pay | Admitting: Obstetrics

## 2017-09-15 DIAGNOSIS — N644 Mastodynia: Secondary | ICD-10-CM

## 2017-09-28 ENCOUNTER — Ambulatory Visit: Payer: Self-pay | Admitting: Obstetrics

## 2017-10-02 ENCOUNTER — Encounter (HOSPITAL_COMMUNITY): Payer: Self-pay

## 2017-10-02 ENCOUNTER — Inpatient Hospital Stay (HOSPITAL_COMMUNITY)
Admission: AD | Admit: 2017-10-02 | Discharge: 2017-10-02 | Disposition: A | Discharge status: Home / Self Care | Payer: Medicaid Other | Source: Ambulatory Visit | Attending: Obstetrics & Gynecology | Admitting: Obstetrics & Gynecology

## 2017-10-02 DIAGNOSIS — D573 Sickle-cell trait: Secondary | ICD-10-CM | POA: Diagnosis not present

## 2017-10-02 DIAGNOSIS — K5909 Other constipation: Secondary | ICD-10-CM | POA: Diagnosis not present

## 2017-10-02 DIAGNOSIS — K219 Gastro-esophageal reflux disease without esophagitis: Secondary | ICD-10-CM | POA: Insufficient documentation

## 2017-10-02 DIAGNOSIS — A599 Trichomoniasis, unspecified: Secondary | ICD-10-CM | POA: Diagnosis not present

## 2017-10-02 DIAGNOSIS — E119 Type 2 diabetes mellitus without complications: Secondary | ICD-10-CM | POA: Diagnosis not present

## 2017-10-02 DIAGNOSIS — F319 Bipolar disorder, unspecified: Secondary | ICD-10-CM | POA: Diagnosis not present

## 2017-10-02 DIAGNOSIS — Z7984 Long term (current) use of oral hypoglycemic drugs: Secondary | ICD-10-CM | POA: Diagnosis not present

## 2017-10-02 DIAGNOSIS — F419 Anxiety disorder, unspecified: Secondary | ICD-10-CM | POA: Diagnosis not present

## 2017-10-02 DIAGNOSIS — N898 Other specified noninflammatory disorders of vagina: Secondary | ICD-10-CM | POA: Diagnosis present

## 2017-10-02 DIAGNOSIS — Z79899 Other long term (current) drug therapy: Secondary | ICD-10-CM | POA: Insufficient documentation

## 2017-10-02 DIAGNOSIS — F909 Attention-deficit hyperactivity disorder, unspecified type: Secondary | ICD-10-CM | POA: Diagnosis not present

## 2017-10-02 DIAGNOSIS — R1030 Lower abdominal pain, unspecified: Secondary | ICD-10-CM | POA: Insufficient documentation

## 2017-10-02 DIAGNOSIS — R112 Nausea with vomiting, unspecified: Secondary | ICD-10-CM | POA: Insufficient documentation

## 2017-10-02 DIAGNOSIS — A5901 Trichomonal vulvovaginitis: Secondary | ICD-10-CM | POA: Insufficient documentation

## 2017-10-02 LAB — URINALYSIS, ROUTINE W REFLEX MICROSCOPIC
BILIRUBIN URINE: NEGATIVE
Glucose, UA: NEGATIVE mg/dL
Hgb urine dipstick: NEGATIVE
Ketones, ur: NEGATIVE mg/dL
Nitrite: NEGATIVE
Protein, ur: NEGATIVE mg/dL
SPECIFIC GRAVITY, URINE: 1.024 (ref 1.005–1.030)
WBC, UA: >50 WBC/hpf — ABNORMAL HIGH (ref 0–5)
pH: 5 (ref 5.0–8.0)

## 2017-10-02 LAB — CBC
HCT: 39.9 % (ref 36.0–46.0)
Hemoglobin: 12.9 g/dL (ref 12.0–15.0)
MCH: 26.4 pg (ref 26.0–34.0)
MCHC: 32.3 g/dL (ref 30.0–36.0)
MCV: 81.8 fL (ref 78.0–100.0)
Platelets: 207 10*3/uL (ref 150–400)
RBC: 4.88 MIL/uL (ref 3.87–5.11)
RDW: 13.1 % (ref 11.5–15.5)
WBC: 5.3 10*3/uL (ref 4.0–10.5)

## 2017-10-02 LAB — WET PREP, GENITAL
Clue Cells Wet Prep HPF POC: NONE SEEN
Sperm: NONE SEEN
Yeast Wet Prep HPF POC: NONE SEEN

## 2017-10-02 LAB — POCT PREGNANCY, URINE: PREG TEST UR: NEGATIVE

## 2017-10-02 MED ORDER — ONDANSETRON 8 MG PO TBDP
8.0000 mg | ORAL_TABLET | Freq: Once | ORAL | Status: AC
  Administered 2017-10-02: 8 mg via ORAL
  Filled 2017-10-02: qty 1

## 2017-10-02 MED ORDER — METRONIDAZOLE 500 MG PO TABS
2000.0000 mg | ORAL_TABLET | Freq: Once | ORAL | Status: AC
  Administered 2017-10-02: 2000 mg via ORAL
  Filled 2017-10-02: qty 4

## 2017-10-02 NOTE — MAU Provider Note (Signed)
Chief Complaint: Nausea   First Provider Initiated Contact with Patient 10/02/17 2041      SUBJECTIVE HPI: Stephanie Mack is a 27 y.o. [id number] not currently pregnant who presents to maternity admissions reporting nausea/vomiting, abdominal pain and vaginal discharge. She reports abdominal pain started last night- describes abdominal pain as lower abdominal cramping. Rates pain 6/10- has taken Ibuprofen for abdominal pain with no relief. She reports vaginal discharge is associated with the abdominal pain. Describes discharge as yellow discharge with no odor or itching- she reports unprotected intercourse 2 weeks ago with hx of GC and Chlamydia. She reports nausea and vomiting that started today- has vomiting twice, did not take HPT- LMP in May. She drank juice in triage and has not vomited since being in MAU.   Past Medical History:  Diagnosis Date  . ADHD (attention deficit hyperactivity disorder)   . Anemia   . Anxiety   . Bipolar 1 disorder ([place])   . Chlamydia 05-31-10  . Chronic constipation   . Depression    depression  . GERD (gastroesophageal reflux disease)    with pregnancy  . Gestational diabetes    current pregnancy  . Gonorrhea contact, treated   . Headache   . Infection    UTI  . Pregnancy induced hypertension    previous pregnancy  . Pseudocyesis 2013   Seen in MAU for percieved FM, abd distension. Normal exam.   . Sickle cell trait ([place])   . Type 2 diabetes mellitus ([place]) 02/13/2017   Past Surgical History:  Procedure Laterality Date  . CESAREAN SECTION N/A 09/01/2012   Procedure:  Primary cesarean section with delivery of baby girl at 57.  Apgars 1/1.  ;  Surgeon: Frederico Hamman, MD;  Location: [facility];  Service: Obstetrics;  Laterality: N/A;  . CESAREAN SECTION N/A 01/07/2015   Procedure: REPEAT CESAREAN SECTION;  Surgeon: Shelly Bombard, MD;  Location: [facility];  Service: Obstetrics;  Laterality: N/A;  . NASAL SEPTUM SURGERY    . WISDOM TOOTH EXTRACTION      Social History   Socioeconomic History  . Marital status: Single    Spouse name: Not on file  . Number of children: Not on file  . Years of education: Not on file  . Highest education level: Not on file  Occupational History  . Not on file  Social Needs  . Financial resource strain: Not on file  . Food insecurity:    Worry: Not on file    Inability: Not on file  . Transportation needs:    Medical: Not on file    Non-medical: Not on file  Tobacco Use  . Smoking status: Never Smoker  . Smokeless tobacco: Never Used  Substance and Sexual Activity  . Alcohol use: No    Alcohol/week: 0.0 oz  . Drug use: No  . Sexual activity: Yes    Partners: Male    Birth control/protection: None  Lifestyle  . Physical activity:    Days per week: Not on file    Minutes per session: Not on file  . Stress: Not on file  Relationships  . Social connections:    Talks on phone: Not on file    Gets together: Not on file    Attends religious service: Not on file    Active member of club or organization: Not on file    Attends meetings of clubs or organizations: Not on file    Relationship status: Not on file  . Intimate  partner violence:    Fear of current or ex partner: Not on file    Emotionally abused: Not on file    Physically abused: Not on file    Forced sexual activity: Not on file  Other Topics Concern  . Not on file  Social History Narrative   Single, one daughter born 2016   Student at [employer]   11/30/2015   No current facility-administered medications on file prior to encounter.    Current Outpatient Medications on File Prior to Encounter  Medication Sig Dispense Refill  . gabapentin (NEURONTIN) 300 MG capsule Take 1 capsule (300 mg total) by mouth 2 (two) times daily. 60 capsule 1  . ibuprofen (ADVIL,MOTRIN) 800 MG tablet Take 1 tablet (800 mg total) by mouth every 8 (eight) hours as needed. 30 tablet 5  . metFORMIN (GLUCOPHAGE) 500 MG tablet Take 1 tablet (500 mg total) by  mouth daily with breakfast. 60 tablet 5  . ACCU-CHEK AVIVA PLUS test strip Check fasting blood sugar AND blood sugar TWO HOURS AFTER each meal (Patient not taking: Reported on 08/08/2017) 100 each prn  . ACCU-CHEK SOFTCLIX LANCETS lancets Check blood sugar fasting AND TWO HOURS BEFORE each meal (Patient not taking: Reported on 08/08/2017) 100 each prn  . ibuprofen (ADVIL,MOTRIN) 600 MG tablet TAKE ONE TABLET BY MOUTH EVERY 6 HOURS AS NEEDED for mild pain 30 tablet 5  . loratadine (CLARITIN) 10 MG tablet TAKE ONE TABLET BY MOUTH EVERY DAY (Patient not taking: No sig reported) 30 tablet 11  . medroxyPROGESTERone (DEPO-PROVERA) 150 MG/ML injection Inject 1 mL (150 mg total) into the muscle every 3 (three) months. 1 mL 4  . OLANZapine (ZYPREXA) 5 MG tablet Take 5 mg by mouth at bedtime.    . pantoprazole (PROTONIX) 20 MG tablet Take 1 tablet (20 mg total) by mouth daily. (Patient not taking: Reported on 08/08/2017) 30 tablet 0  . Prenat-FeCbn-FeAsp-Meth-FA-DHA (PRENATE MINI) 18-0.6-0.4-350 MG CAPS TAKE one tablet BY MOUTH EVERY DAY (Patient not taking: Reported on 08/08/2017) 30 capsule 11  . ranitidine (ZANTAC) 150 MG tablet TAKE ONE TABLET BY MOUTH TWICE DAILY (Patient not taking: Reported on 08/08/2017) 60 tablet 11  . sertraline (ZOLOFT) 25 MG tablet Take 3 tablets (75 mg total) by mouth daily. 30 tablet 0   No Known Allergies  ROS:  Review of Systems  Respiratory: Negative.   Cardiovascular: Negative.   Gastrointestinal: Positive for abdominal pain, nausea and vomiting.  Genitourinary: Positive for vaginal discharge. Negative for difficulty urinating, dysuria, flank pain, frequency, pelvic pain, urgency and vaginal bleeding.   I have reviewed patient's Past Medical Hx, Surgical Hx, Family Hx, Social Hx, medications and allergies.   Physical Exam   Patient Vitals for the past 24 hrs:  BP Temp Temp src Pulse Resp SpO2 Height Weight  10/02/17 2140 118/76 98.2 F (36.8 C) Oral 81 18 - - -  10/02/17  1948 132/80 98.1 F (36.7 C) - 88 17 100 % 5\' 2"  (1.575 m) 132 lb 4 oz (60 kg)   Constitutional: Well-developed, well-nourished female in no acute distress.  Cardiovascular: normal rate Respiratory: normal effort GI: Abd soft, non-tender. Pos BS x 4 MS: Extremities nontender, no edema, normal ROM Neurologic: Alert and oriented x 4.   PELVIC EXAM: Cervix pink, visually closed, without lesion, cervix is friabile on examination with small amount of bleeding coming from cervical os after swabs collected, moderate amount of gray watery discharge noted around cervix, vaginal walls and external genitalia normal Bimanual exam:  Cervix 0/long/high, firm, anterior, neg CMT, uterus nontender, nonenlarged, adnexa without tenderness, enlargement, or mass  LAB RESULTS Results for orders placed or performed during the hospital encounter of 10/02/17 (from the past 24 hour(s))  Urinalysis, Routine w reflex microscopic     Status: Abnormal   Collection Time: 10/02/17  8:10 PM  Result Value Ref Range   Color, Urine YELLOW YELLOW   APPearance HAZY (A) CLEAR   Specific Gravity, Urine 1.024 1.005 - 1.030   pH 5.0 5.0 - 8.0   Glucose, UA NEGATIVE NEGATIVE mg/dL   Hgb urine dipstick NEGATIVE NEGATIVE   Bilirubin Urine NEGATIVE NEGATIVE   Ketones, ur NEGATIVE NEGATIVE mg/dL   Protein, ur NEGATIVE NEGATIVE mg/dL   Nitrite NEGATIVE NEGATIVE   Leukocytes, UA LARGE (A) NEGATIVE   RBC / HPF 21-50 0 - 5 RBC/hpf   WBC, UA >50 (H) 0 - 5 WBC/hpf   Bacteria, UA RARE (A) NONE SEEN   Squamous Epithelial / LPF 6-10 0 - 5   Mucus PRESENT   Pregnancy, urine POC     Status: None   Collection Time: 10/02/17  8:13 PM  Result Value Ref Range   Preg Test, Ur NEGATIVE NEGATIVE  Wet prep, genital     Status: Abnormal   Collection Time: 10/02/17  8:50 PM  Result Value Ref Range   Yeast Wet Prep HPF POC NONE SEEN NONE SEEN   Trich, Wet Prep PRESENT (A) NONE SEEN   Clue Cells Wet Prep HPF POC NONE SEEN NONE SEEN   WBC, Wet  Prep HPF POC MANY (A) NONE SEEN   Sperm NONE SEEN   CBC     Status: None   Collection Time: 10/02/17  8:53 PM  Result Value Ref Range   WBC 5.3 4.0 - 10.5 K/uL   RBC 4.88 3.87 - 5.11 MIL/uL   Hemoglobin 12.9 12.0 - 15.0 g/dL   HCT 39.9 36.0 - 46.0 %   MCV 81.8 78.0 - 100.0 fL   MCH 26.4 26.0 - 34.0 pg   MCHC 32.3 30.0 - 36.0 g/dL   RDW 13.1 11.5 - 15.5 %   Platelets 207 150 - 400 K/uL   MAU Management/MDM: Orders Placed This Encounter  Procedures  . Wet prep, genital  . Urine Culture  . Urinalysis, Routine w reflex microscopic  . CBC  . Pregnancy, urine POC  . Discharge patient Discharge disposition: 01-Home or Self Care; Discharge patient date: 10/02/2017   Wet prep- positive for Trich, will treat in MAU prior to discharge home  Urine culture pending  CBC- WNL  UPT- negative  GC/C- pending   Meds ordered this encounter  Medications  . ondansetron (ZOFRAN-ODT) disintegrating tablet 8 mg  . metroNIDAZOLE (FLAGYL) tablet 2,000 mg   Treatments in MAU included zofran 8mg  ODT for n/v and Flagyl 2g for tx of trich. Discussed results with patient- offered treatment for GC/C due to cervicitis- patient declines treatment and wants to wait until results are back. Educated on expedited partner therapy- written Rx given to patient for partner, educated on not having intercourse for 10-14 days after he gets treated. Patient verbalizes understanding. Pt discharged. Pt stable at time of discharge.  ASSESSMENT 1. Trichomonosis   2. Lower abdominal pain   3. Intractable vomiting with nausea, unspecified vomiting type     PLAN Discharge home Will call patient with results of GC/C if positive  Expedited partner therapy  Discussed reasons to return to MAU  Follow up as scheduled with  PCP for annual examination  Discussed safe sex and how to protect herself from STDs If patient does not start period within the next 2 weeks- take HPT   Follow-up Information    Department, [facility] Follow up.   Why:  Go to health department for further STD testing if needed. Do not intercourse for 10-14 days after partner is treated  Contact information: [place] [id number] [phone]           Allergies as of 10/02/2017   No Known Allergies     Medication List    TAKE these medications   ACCU-CHEK AVIVA PLUS test strip Generic drug:  glucose blood Check fasting blood sugar AND blood sugar TWO HOURS AFTER each meal   ACCU-CHEK SOFTCLIX LANCETS lancets Check blood sugar fasting AND TWO HOURS BEFORE each meal   gabapentin 300 MG capsule Commonly known as:  NEURONTIN Take 1 capsule (300 mg total) by mouth 2 (two) times daily.   ibuprofen 800 MG tablet Commonly known as:  ADVIL,MOTRIN Take 1 tablet (800 mg total) by mouth every 8 (eight) hours as needed.   ibuprofen 600 MG tablet Commonly known as:  ADVIL,MOTRIN TAKE ONE TABLET BY MOUTH EVERY 6 HOURS AS NEEDED for mild pain   loratadine 10 MG tablet Commonly known as:  CLARITIN TAKE ONE TABLET BY MOUTH EVERY DAY   medroxyPROGESTERone 150 MG/ML injection Commonly known as:  DEPO-PROVERA Inject 1 mL (150 mg total) into the muscle every 3 (three) months.   metFORMIN 500 MG tablet Commonly known as:  GLUCOPHAGE Take 1 tablet (500 mg total) by mouth daily with breakfast.   OLANZapine 5 MG tablet Commonly known as:  ZYPREXA Take 5 mg by mouth at bedtime.   pantoprazole 20 MG tablet Commonly known as:  PROTONIX Take 1 tablet (20 mg total) by mouth daily.   PRENATE MINI 18-0.6-0.4-350 MG Caps TAKE one tablet BY MOUTH EVERY DAY   ranitidine 150 MG tablet Commonly known as:  ZANTAC TAKE ONE TABLET BY MOUTH TWICE DAILY   sertraline 25 MG tablet Commonly known as:  ZOLOFT Take 3 tablets (75 mg total) by mouth daily.       Darrol Poke  Certified Nurse-Midwife 10/02/2017  9:26 PM

## 2017-10-02 NOTE — Discharge Instructions (Signed)
Expedited Partner Therapy:  °Information Sheet for Patients and Partners  °            ° °You have been offered expedited partner therapy (EPT). This information sheet contains important information and warnings you need to be aware of, so please read it carefully.  ° °Expedited Partner Therapy (EPT) is the clinical practice of treating the sexual partners of persons who receive chlamydia, gonorrhea, or trichomoniasis diagnoses by providing medications or prescriptions to the patient. Patients then provide partners with these therapies without the health-care provider having examined the partner. In other words, EPT is a convenient, fast and private way for patients to help their sexual partners get treated.  ° °Chlamydia and gonorrhea are bacterial infections you get from having sex with a person who is already infected. Trichomoniasis (or “trich”) is a very common sexually transmitted infection (STI) that is caused by infection with a protozoan parasite called Trichomonas vaginalis.  Many people with these infections don’t know it because they feel fine, but without treatment these infections can cause serious health problems, such as pelvic inflammatory disease, ectopic pregnancy, infertility and increased risk of HIV.  ° °It is important to get treated as soon as possible to protect your health, to avoid spreading these infections to others, and to prevent yourself from becoming re-infected. The good news is these infections can be easily cured with proper antibiotic medicine. The best way to take care of your self is to see a doctor or go to your local health department. If you are not able to see a doctor or other medical provider, you should take EPT.  ° ° °Recommended Medication: °EPT for Chlamydia:  Azithromycin (Zithromax) 1 gram orally in a single dose °EPT for Gonorrhea:  Cefixime (Suprax) 400 milligrams orally in a single dose PLUS azithromycin (Zithromax) 1 gram orally in a single dose °EPT for  Trichomoniasis:  Metronidazole (Flagyl) 2 grams orally in a single dose ° ° °These medicines are very safe. However, you should not take them if you have ever had an allergic reaction (like a rash) to any of these medicines: azithromycin (Zithromax), erythromycin, clarithromycin (Biaxin), metronidazole (Flagyl), tinidazole (Tindimax). If you are uncertain about whether you have an allergy, call your medical provider or pharmacist before taking this medicine. If you have a serious, long-term illness like kidney, liver or heart disease, colitis or stomach problems, or you are currently taking other prescription medication, talk to your provider before taking this medication.  ° °Women: If you have lower belly pain, pain during sex, vomiting, or a fever, do not take this medicine. Instead, you should see a medical provider to be certain you do not have pelvic inflammatory disease (PID). PID can be serious and lead to infertility, pregnancy problems or chronic pelvic pain.  ° °Pregnant Women: It is very important for you to see a doctor to get pregnancy services and pre-natal care. These antibiotics for EPT are safe for pregnant women, but you still need to see a medical provider as soon as possible. It is also important to note that Doxycycline is an alternative therapy for chlamydia, but it should not be taken by someone who is pregnant.  ° °Men: If you have pain or swelling in the testicles or a fever, do not take this medicine and see a medical provider.    ° °Men who have sex with men (MSM): MSM in [place] continue to experience high rates of syphilis and HIV. Many MSM with gonorrhea or   chlamydia could also have syphilis and/or HIV and not know it. If you are a man who has sex with other men, it is very important that you see a medical provider and are tested for HIV and syphilis. EPT is not recommended for gonorrhea for MSM.  Recommended treatment for gonorrhea for MSM is Rocephin (shot) AND azithromycin  due to decreased cure rate.  Please see your medical provider if this is the case.   ° °Along with this information sheet is a prescription for the medicine. If you receive a prescription it will be in your name and will indicate your date of birth, or it will be in the name of “Expedited Partner Therapy”.   In either case, you can have the prescription filled at a pharmacy. You will be responsible for the cost of the medicine, unless you have prescription drug coverage. In that case, you could provide your name so the pharmacy could bill your health plan.  ° °Take the medication as directed. Some people will have a mild, upset stomach, which does not last long. AVOID alcohol 24 hours after taking metronidazole (Flagyl) to reduce the possibility of a disulfiram-like reaction (severe vomiting and abdominal pain).  After taking the medicine, do not have sex for 7 days. Do not share this medicine or give it to anyone else. It is important to tell everyone you have had sex with in the last 60 days that they need to go and get tested for sexually transmitted infections.  ° °Ways to prevent these and other sexually transmitted infections (STIs):  ° °• Abstain from sex. This is the only sure way to avoid getting an STI.  °• Use barrier methods, such as condoms, consistently and correctly.  °• Limit the number of sexual partners.  °• Have regular physical exams, including testing for STIs.  ° °For more information about EPT or other issues pertaining to an STI, please contact your medical provider or the [place] [facility] at [phone] or [email or web address]   ° °

## 2017-10-02 NOTE — MAU Note (Addendum)
Lower abdominal pain that started last night-came on suddenly.  No VB. Yellow discharge, no foul odor or itching.  LMP in May, has not taken a HPT.  Took ibuprofen for the pain this morning without relief.  Patient also states she cannot keep anything down.  Vomited twice today.  Currently drinking juice from a bottle in triage.

## 2017-10-03 ENCOUNTER — Ambulatory Visit: Payer: Self-pay | Admitting: Obstetrics

## 2017-10-03 LAB — GC/CHLAMYDIA PROBE AMP (~~LOC~~) NOT AT ARMC
Chlamydia: NEGATIVE
Neisseria Gonorrhea: NEGATIVE

## 2017-10-04 ENCOUNTER — Other Ambulatory Visit: Payer: Self-pay | Admitting: Family Medicine

## 2017-10-04 ENCOUNTER — Other Ambulatory Visit: Payer: Self-pay | Admitting: Obstetrics

## 2017-10-04 DIAGNOSIS — N390 Urinary tract infection, site not specified: Secondary | ICD-10-CM

## 2017-10-04 DIAGNOSIS — N946 Dysmenorrhea, unspecified: Secondary | ICD-10-CM

## 2017-10-04 LAB — URINE CULTURE: Culture: 80000 — AB

## 2017-10-04 MED ORDER — AMOXICILLIN 500 MG PO CAPS: 500.0000 mg | ORAL_CAPSULE | Freq: Three times a day (TID) | ORAL | 0 refills | Status: AC

## 2017-10-04 NOTE — Progress Notes (Signed)
Meds ordered this encounter  Medications  . amoxicillin (AMOXIL) 500 MG capsule    Sig: Take 1 capsule (500 mg total) by mouth 3 (three) times daily for 10 days.    Dispense:  30 capsule    Refill:  0     Stephanie Pounds  MSN, FNP-C [facility] [address] [place], [place] [address] [phone]

## 2017-10-04 NOTE — Telephone Encounter (Signed)
Please review for refill.  

## 2017-10-10 ENCOUNTER — Ambulatory Visit: Payer: Medicaid Other | Admitting: Obstetrics

## 2017-10-17 ENCOUNTER — Ambulatory Visit (INDEPENDENT_AMBULATORY_CARE_PROVIDER_SITE_OTHER): Payer: Medicaid Other | Admitting: Obstetrics

## 2017-10-17 ENCOUNTER — Encounter: Payer: Self-pay | Admitting: Obstetrics

## 2017-10-17 VITALS — BP 127/83 | HR 80 | Wt 133.2 lb

## 2017-10-17 DIAGNOSIS — N390 Urinary tract infection, site not specified: Secondary | ICD-10-CM

## 2017-10-17 DIAGNOSIS — A499 Bacterial infection, unspecified: Secondary | ICD-10-CM | POA: Diagnosis not present

## 2017-10-17 DIAGNOSIS — A5901 Trichomonal vulvovaginitis: Secondary | ICD-10-CM

## 2017-10-17 NOTE — Progress Notes (Signed)
Patient ID: Stephanie Mack, female   DOB: 03-15-1991, 27 y.o.   MRN: [id number]  Chief Complaint  Patient presents with  . Vaginal Discharge    HPI Stephanie Mack is a 27 y.o. female.  Seen at [facility] and tested positive for Trichomonas, treated, and partner also treated.  Also being treated for GBS UTI. HPI  Past Medical History:  Diagnosis Date  . ADHD (attention deficit hyperactivity disorder)   . Anemia   . Anxiety   . Bipolar 1 disorder ([place])   . Chlamydia 05-31-10  . Chronic constipation   . Depression    depression  . GERD (gastroesophageal reflux disease)    with pregnancy  . Gestational diabetes    current pregnancy  . Gonorrhea contact, treated   . Headache   . Infection    UTI  . Pregnancy induced hypertension    previous pregnancy  . Pseudocyesis 2013   Seen in MAU for percieved FM, abd distension. Normal exam.   . Sickle cell trait ([place])   . Type 2 diabetes mellitus ([place]) 02/13/2017    Past Surgical History:  Procedure Laterality Date  . CESAREAN SECTION N/A 09/01/2012   Procedure:  Primary cesarean section with delivery of baby girl at 3.  Apgars 1/1.  ;  Surgeon: Frederico Hamman, MD;  Location: [facility];  Service: Obstetrics;  Laterality: N/A;  . CESAREAN SECTION N/A 01/07/2015   Procedure: REPEAT CESAREAN SECTION;  Surgeon: Shelly Bombard, MD;  Location: [facility];  Service: Obstetrics;  Laterality: N/A;  . NASAL SEPTUM SURGERY    . WISDOM TOOTH EXTRACTION      Family History  Problem Relation Age of Onset  . Kidney disease Mother   . Hypertension Father   . Cancer Sister   . Asthma Brother   . Diabetes Maternal Grandfather   . Heart disease Neg Hx     Social History Social History   Tobacco Use  . Smoking status: Never Smoker  . Smokeless tobacco: Never Used  Substance Use Topics  . Alcohol use: No    Alcohol/week: 0.0 oz  . Drug use: No    No Known Allergies  Current Outpatient Medications  Medication Sig Dispense Refill  .  meloxicam (MOBIC) 15 MG tablet Take 1 tablet (15 mg total) by mouth daily as needed. 30 tablet 5  . ACCU-CHEK AVIVA PLUS test strip Check fasting blood sugar AND blood sugar TWO HOURS AFTER each meal (Patient not taking: Reported on 08/08/2017) 100 each prn  . ACCU-CHEK SOFTCLIX LANCETS lancets Check blood sugar fasting AND TWO HOURS BEFORE each meal (Patient not taking: Reported on 08/08/2017) 100 each prn  . gabapentin (NEURONTIN) 300 MG capsule Take 1 capsule (300 mg total) by mouth 2 (two) times daily. 60 capsule 1  . ibuprofen (ADVIL,MOTRIN) 600 MG tablet TAKE ONE TABLET BY MOUTH EVERY 6 HOURS AS NEEDED for mild pain 30 tablet 5  . ibuprofen (ADVIL,MOTRIN) 800 MG tablet Take 1 tablet (800 mg total) by mouth every 8 (eight) hours as needed. 30 tablet 5  . loratadine (CLARITIN) 10 MG tablet TAKE ONE TABLET BY MOUTH EVERY DAY (Patient not taking: No sig reported) 30 tablet 11  . medroxyPROGESTERone (DEPO-PROVERA) 150 MG/ML injection Inject 1 mL (150 mg total) into the muscle every 3 (three) months. 1 mL 4  . metFORMIN (GLUCOPHAGE) 500 MG tablet Take 1 tablet (500 mg total) by mouth daily with breakfast. 60 tablet 5  . OLANZapine (ZYPREXA) 5 MG tablet Take 5  mg by mouth at bedtime.    . pantoprazole (PROTONIX) 20 MG tablet Take 1 tablet (20 mg total) by mouth daily. (Patient not taking: Reported on 08/08/2017) 30 tablet 0  . Prenat-FeCbn-FeAsp-Meth-FA-DHA (PRENATE MINI) 18-0.6-0.4-350 MG CAPS TAKE one tablet BY MOUTH EVERY DAY (Patient not taking: Reported on 08/08/2017) 30 capsule 11  . ranitidine (ZANTAC) 150 MG tablet TAKE ONE TABLET BY MOUTH TWICE DAILY (Patient not taking: Reported on 08/08/2017) 60 tablet 11  . sertraline (ZOLOFT) 25 MG tablet Take 3 tablets (75 mg total) by mouth daily. (Patient not taking: Reported on 10/17/2017) 30 tablet 0   No current facility-administered medications for this visit.     Review of Systems Review of Systems Constitutional: negative for fatigue and weight  loss Respiratory: negative for cough and wheezing Cardiovascular: negative for chest pain, fatigue and palpitations Gastrointestinal: negative for abdominal pain and change in bowel habits Genitourinary:negative Integument/breast: negative for nipple discharge Musculoskeletal:negative for myalgias Neurological: negative for gait problems and tremors Behavioral/Psych: negative for abusive relationship, depression Endocrine: negative for temperature intolerance      Blood pressure 127/83, pulse 80, weight 133 lb 3.2 oz (60.4 kg).  Physical Exam Physical Exam:  Deferred  >50% of 15 min visit spent on counseling and coordination of care.   Data Reviewed Wet Prep Urine culture  Assessment     1. Trichomonal vaginitis - treated  2. Bacterial UTI - treated    Plan    Follow up in 3 months for Trichomonas TOC   No orders of the defined types were placed in this encounter.  No orders of the defined types were placed in this encounter.  Shelly Bombard MD 10-17-2017

## 2017-10-17 NOTE — Progress Notes (Signed)
Pt was just seen in Hospital on 10/02/17 and tested positive for [facility].  SL:HTDSK infection

## 2017-10-31 ENCOUNTER — Ambulatory Visit: Payer: Medicaid Other | Admitting: Nurse Practitioner

## 2017-11-06 ENCOUNTER — Ambulatory Visit (INDEPENDENT_AMBULATORY_CARE_PROVIDER_SITE_OTHER): Payer: Medicaid Other | Admitting: Obstetrics

## 2017-11-06 ENCOUNTER — Encounter: Payer: Self-pay | Admitting: Obstetrics

## 2017-11-06 ENCOUNTER — Other Ambulatory Visit: Payer: Self-pay

## 2017-11-06 ENCOUNTER — Other Ambulatory Visit (HOSPITAL_COMMUNITY)
Admission: RE | Admit: 2017-11-06 | Discharge: 2017-11-06 | Disposition: A | Discharge status: Home / Self Care | Payer: Medicaid Other | Source: Ambulatory Visit | Attending: Obstetrics | Admitting: Obstetrics

## 2017-11-06 VITALS — BP 123/85 | HR 90 | Ht 62.0 in | Wt 135.3 lb

## 2017-11-06 DIAGNOSIS — Z3202 Encounter for pregnancy test, result negative: Secondary | ICD-10-CM

## 2017-11-06 DIAGNOSIS — N898 Other specified noninflammatory disorders of vagina: Secondary | ICD-10-CM | POA: Diagnosis not present

## 2017-11-06 DIAGNOSIS — R109 Unspecified abdominal pain: Secondary | ICD-10-CM

## 2017-11-06 DIAGNOSIS — N644 Mastodynia: Secondary | ICD-10-CM

## 2017-11-06 LAB — POCT URINE PREGNANCY: PREG TEST UR: NEGATIVE

## 2017-11-06 NOTE — Progress Notes (Signed)
Presents for abdominal pain and breast tenderness 9/10 x 2 weeks, she is unable to sleep.  UPT today is Negative

## 2017-11-06 NOTE — Progress Notes (Signed)
Patient ID: Stephanie Mack, female   DOB: 07/11/1990, 27 y.o.   MRN: [id number]  Chief Complaint  Patient presents with  . Breast Pain  . Abdominal Pain    HPI Stephanie Mack is a 27 y.o. female.  Abdominal cramping and breast tenderness for past 2 weeks.  Difficulty sleeping. HPI  Past Medical History:  Diagnosis Date  . ADHD (attention deficit hyperactivity disorder)   . Anemia   . Anxiety   . Bipolar 1 disorder ([place])   . Chlamydia 05-31-10  . Chronic constipation   . Depression    depression  . GERD (gastroesophageal reflux disease)    with pregnancy  . Gestational diabetes    current pregnancy  . Gonorrhea contact, treated   . Headache   . Infection    UTI  . Pregnancy induced hypertension    previous pregnancy  . Pseudocyesis 2013   Seen in MAU for percieved FM, abd distension. Normal exam.   . Sickle cell trait ([place])   . Type 2 diabetes mellitus ([place]) 02/13/2017    Past Surgical History:  Procedure Laterality Date  . CESAREAN SECTION N/A 09/01/2012   Procedure:  Primary cesarean section with delivery of baby girl at 74.  Apgars 1/1.  ;  Surgeon: Frederico Hamman, MD;  Location: [facility];  Service: Obstetrics;  Laterality: N/A;  . CESAREAN SECTION N/A 01/07/2015   Procedure: REPEAT CESAREAN SECTION;  Surgeon: Shelly Bombard, MD;  Location: [facility];  Service: Obstetrics;  Laterality: N/A;  . NASAL SEPTUM SURGERY    . WISDOM TOOTH EXTRACTION      Family History  Problem Relation Age of Onset  . Kidney disease Mother   . Hypertension Father   . Cancer Sister   . Asthma Brother   . Diabetes Maternal Grandfather   . Heart disease Neg Hx     Social History Social History   Tobacco Use  . Smoking status: Never Smoker  . Smokeless tobacco: Never Used  Substance Use Topics  . Alcohol use: No    Alcohol/week: 0.0 oz  . Drug use: No    No Known Allergies  Current Outpatient Medications  Medication Sig Dispense Refill  . ACCU-CHEK AVIVA PLUS test  strip Check fasting blood sugar AND blood sugar TWO HOURS AFTER each meal (Patient not taking: Reported on 08/08/2017) 100 each prn  . ACCU-CHEK SOFTCLIX LANCETS lancets Check blood sugar fasting AND TWO HOURS BEFORE each meal (Patient not taking: Reported on 08/08/2017) 100 each prn  . gabapentin (NEURONTIN) 300 MG capsule Take 1 capsule (300 mg total) by mouth 2 (two) times daily. 60 capsule 1  . ibuprofen (ADVIL,MOTRIN) 600 MG tablet TAKE ONE TABLET BY MOUTH EVERY 6 HOURS AS NEEDED for mild pain 30 tablet 5  . ibuprofen (ADVIL,MOTRIN) 800 MG tablet Take 1 tablet (800 mg total) by mouth every 8 (eight) hours as needed. 30 tablet 5  . loratadine (CLARITIN) 10 MG tablet TAKE ONE TABLET BY MOUTH EVERY DAY (Patient not taking: No sig reported) 30 tablet 11  . medroxyPROGESTERone (DEPO-PROVERA) 150 MG/ML injection Inject 1 mL (150 mg total) into the muscle every 3 (three) months. 1 mL 4  . meloxicam (MOBIC) 15 MG tablet Take 1 tablet (15 mg total) by mouth daily as needed. 30 tablet 5  . metFORMIN (GLUCOPHAGE) 500 MG tablet Take 1 tablet (500 mg total) by mouth daily with breakfast. 60 tablet 5  . OLANZapine (ZYPREXA) 5 MG tablet Take 5 mg by mouth at  bedtime.    . pantoprazole (PROTONIX) 20 MG tablet Take 1 tablet (20 mg total) by mouth daily. (Patient not taking: Reported on 08/08/2017) 30 tablet 0  . Prenat-FeCbn-FeAsp-Meth-FA-DHA (PRENATE MINI) 18-0.6-0.4-350 MG CAPS TAKE one tablet BY MOUTH EVERY DAY (Patient not taking: Reported on 08/08/2017) 30 capsule 11  . ranitidine (ZANTAC) 150 MG tablet TAKE ONE TABLET BY MOUTH TWICE DAILY (Patient not taking: Reported on 08/08/2017) 60 tablet 11  . sertraline (ZOLOFT) 25 MG tablet Take 3 tablets (75 mg total) by mouth daily. (Patient not taking: Reported on 10/17/2017) 30 tablet 0   No current facility-administered medications for this visit.     Review of Systems Review of Systems Constitutional: negative for fatigue and weight loss Respiratory: negative for  cough and wheezing Cardiovascular: negative for chest pain, fatigue and palpitations Gastrointestinal: POSITIVE  for abdominal pain and negative for change in bowel habits Genitourinary:POSITIVE for malodorous vaginal discharge, no vaginal irritation Integument/breast: POSITIVE for breast tenderness, bilaterally Musculoskeletal:negative for myalgias Neurological: negative for gait problems and tremors Behavioral/Psych: negative for abusive relationship, depression Endocrine: negative for temperature intolerance      Blood pressure 123/85, pulse 90, height 5\' 2"  (1.575 m), weight 135 lb 4.8 oz (61.4 kg).  Physical Exam Physical Exam General:   alert  Skin:   no rash or abnormalities  Lungs:   clear to auscultation bilaterally  Heart:   regular rate and rhythm, S1, S2 normal, no murmur, click, rub or gallop  Breasts:   normal without suspicious masses, skin or nipple changes or axillary nodes.  Tender to palpation, bilaterally  Abdomen:  normal findings: no organomegaly, soft, non-tender and no hernia  Pelvis:  External genitalia: normal general appearance Urinary system: urethral meatus normal and bladder without fullness, nontender Vaginal: normal without tenderness, induration or masses Cervix: normal appearance Adnexa: normal bimanual exam Uterus: anteverted and non-tender, normal size    50% of 15 min visit spent on counseling and coordination of care.   Data Reviewed UPT Wet Prep and Cultures Urine culture  Assessment and Plan:    1. Breast tenderness in female Rx: - MM DIAG BREAST TOMO BILATERAL; Future - POCT urine pregnancy  2. Abdominal cramping Rx: - Urine Culture  3. Vaginal discharge Rx: - Cervicovaginal ancillary only    Plan    Follow up in 2 weeks  Orders Placed This Encounter  Procedures  . Urine Culture  . MM DIAG BREAST TOMO BILATERAL    Standing Status:   Future    Standing Expiration Date:   01/07/2019    Order Specific Question:   Reason  for Exam (SYMPTOM  OR DIAGNOSIS REQUIRED)    Answer:   Breast tenderness, bilateral.    Order Specific Question:   Is the patient pregnant?    Answer:   No    Order Specific Question:   Preferred imaging location?    Answer:   [facility]  . POCT urine pregnancy   No orders of the defined types were placed in this encounter.   Shelly Bombard MD 11-06-2017

## 2017-11-07 LAB — CERVICOVAGINAL ANCILLARY ONLY
Bacterial vaginitis: NEGATIVE
Chlamydia: NEGATIVE
NEISSERIA GONORRHEA: NEGATIVE
Trichomonas: NEGATIVE

## 2017-11-08 LAB — URINE CULTURE

## 2017-11-10 ENCOUNTER — Inpatient Hospital Stay: Admission: RE | Admit: 2017-11-10 | Payer: Self-pay | Source: Ambulatory Visit

## 2017-11-10 ENCOUNTER — Other Ambulatory Visit: Payer: Self-pay

## 2017-11-15 ENCOUNTER — Ambulatory Visit: Payer: Self-pay | Admitting: Family Medicine

## 2017-11-20 ENCOUNTER — Ambulatory Visit: Payer: Medicaid Other | Admitting: Obstetrics

## 2017-11-20 ENCOUNTER — Encounter: Payer: Self-pay | Admitting: Obstetrics

## 2017-11-20 VITALS — BP 125/74 | HR 80 | Ht 62.0 in | Wt 134.0 lb

## 2017-11-20 DIAGNOSIS — R109 Unspecified abdominal pain: Secondary | ICD-10-CM | POA: Diagnosis not present

## 2017-11-20 DIAGNOSIS — N644 Mastodynia: Secondary | ICD-10-CM | POA: Diagnosis not present

## 2017-11-20 NOTE — Progress Notes (Signed)
Patient ID: Stephanie Mack, female   DOB: 1990/10/30, 28 y.o.   MRN: [id number]  Chief Complaint  Patient presents with  . Follow-up    HPI Stephanie Mack is a 27 y.o. female.  Abdominal cramping and breast tenderness has resolved. HPI  Past Medical History:  Diagnosis Date  . ADHD (attention deficit hyperactivity disorder)   . Anemia   . Anxiety   . Bipolar 1 disorder ([place])   . Chlamydia 05-31-10  . Chronic constipation   . Depression    depression  . GERD (gastroesophageal reflux disease)    with pregnancy  . Gestational diabetes    current pregnancy  . Gonorrhea contact, treated   . Headache   . Infection    UTI  . Pregnancy induced hypertension    previous pregnancy  . Pseudocyesis 2013   Seen in MAU for percieved FM, abd distension. Normal exam.   . Sickle cell trait ([place])   . Type 2 diabetes mellitus ([place]) 02/13/2017    Past Surgical History:  Procedure Laterality Date  . CESAREAN SECTION N/A 09/01/2012   Procedure:  Primary cesarean section with delivery of baby girl at 44.  Apgars 1/1.  ;  Surgeon: Frederico Hamman, MD;  Location: [facility];  Service: Obstetrics;  Laterality: N/A;  . CESAREAN SECTION N/A 01/07/2015   Procedure: REPEAT CESAREAN SECTION;  Surgeon: Shelly Bombard, MD;  Location: [facility];  Service: Obstetrics;  Laterality: N/A;  . NASAL SEPTUM SURGERY    . WISDOM TOOTH EXTRACTION      Family History  Problem Relation Age of Onset  . Kidney disease Mother   . Hypertension Father   . Cancer Sister   . Asthma Brother   . Diabetes Maternal Grandfather   . Heart disease Neg Hx     Social History Social History   Tobacco Use  . Smoking status: Never Smoker  . Smokeless tobacco: Never Used  Substance Use Topics  . Alcohol use: No    Alcohol/week: 0.0 standard drinks  . Drug use: No    No Known Allergies  Current Outpatient Medications  Medication Sig Dispense Refill  . lurasidone (LATUDA) 40 MG TABS tablet Take 40 mg by mouth daily  with breakfast.    . metFORMIN (GLUCOPHAGE) 500 MG tablet Take 1 tablet (500 mg total) by mouth daily with breakfast. 60 tablet 5  . ACCU-CHEK AVIVA PLUS test strip Check fasting blood sugar AND blood sugar TWO HOURS AFTER each meal (Patient not taking: Reported on 08/08/2017) 100 each prn  . ACCU-CHEK SOFTCLIX LANCETS lancets Check blood sugar fasting AND TWO HOURS BEFORE each meal (Patient not taking: Reported on 08/08/2017) 100 each prn  . gabapentin (NEURONTIN) 300 MG capsule Take 1 capsule (300 mg total) by mouth 2 (two) times daily. (Patient not taking: Reported on 11/20/2017) 60 capsule 1  . ibuprofen (ADVIL,MOTRIN) 600 MG tablet TAKE ONE TABLET BY MOUTH EVERY 6 HOURS AS NEEDED for mild pain (Patient not taking: Reported on 11/20/2017) 30 tablet 5  . ibuprofen (ADVIL,MOTRIN) 800 MG tablet Take 1 tablet (800 mg total) by mouth every 8 (eight) hours as needed. (Patient not taking: Reported on 11/20/2017) 30 tablet 5  . loratadine (CLARITIN) 10 MG tablet TAKE ONE TABLET BY MOUTH EVERY DAY (Patient not taking: No sig reported) 30 tablet 11  . medroxyPROGESTERone (DEPO-PROVERA) 150 MG/ML injection Inject 1 mL (150 mg total) into the muscle every 3 (three) months. (Patient not taking: Reported on 11/20/2017) 1 mL 4  .  meloxicam (MOBIC) 15 MG tablet Take 1 tablet (15 mg total) by mouth daily as needed. (Patient not taking: Reported on 11/20/2017) 30 tablet 5  . OLANZapine (ZYPREXA) 5 MG tablet Take 5 mg by mouth at bedtime.    . pantoprazole (PROTONIX) 20 MG tablet Take 1 tablet (20 mg total) by mouth daily. (Patient not taking: Reported on 08/08/2017) 30 tablet 0  . Prenat-FeCbn-FeAsp-Meth-FA-DHA (PRENATE MINI) 18-0.6-0.4-350 MG CAPS TAKE one tablet BY MOUTH EVERY DAY (Patient not taking: Reported on 08/08/2017) 30 capsule 11  . ranitidine (ZANTAC) 150 MG tablet TAKE ONE TABLET BY MOUTH TWICE DAILY (Patient not taking: Reported on 08/08/2017) 60 tablet 11  . sertraline (ZOLOFT) 25 MG tablet Take 3 tablets (75 mg  total) by mouth daily. (Patient not taking: Reported on 10/17/2017) 30 tablet 0   No current facility-administered medications for this visit.     Review of Systems Review of Systems Constitutional: negative for fatigue and weight loss Respiratory: negative for cough and wheezing Cardiovascular: negative for chest pain, fatigue and palpitations Gastrointestinal: negative for abdominal pain and change in bowel habits Genitourinary:negative Integument/breast: negative for nipple discharge Musculoskeletal:negative for myalgias Neurological: negative for gait problems and tremors Behavioral/Psych: negative for abusive relationship, depression Endocrine: negative for temperature intolerance      Blood pressure 125/74, pulse 80, height 5\' 2"  (1.575 m), weight 134 lb (60.8 kg).  Physical Exam Physical Exam:  Deferred  50% of 20 min visit spent on counseling and coordination of care.   Data Reviewed Urine culture  Wet Prep and GC/CH cultures  Assessment     1. Abdominal cramping - resolved  2. Breast tenderness in female - resolved    Plan    Follow up in 3 months for Annual / Pap Smear No orders of the defined types were placed in this encounter.  No orders of the defined types were placed in this encounter.   Shelly Bombard MD 11-20-2017

## 2017-11-20 NOTE — Progress Notes (Signed)
Patient is in the office for follow up. [facility]; did not go to appt. Pt denies any pain today. Pt states that she no longer wants to continue depo, and does not want any other method.

## 2017-11-24 ENCOUNTER — Ambulatory Visit: Payer: Self-pay | Admitting: Family Medicine

## 2017-11-27 ENCOUNTER — Ambulatory Visit: Payer: Self-pay | Admitting: Family Medicine

## 2017-11-28 ENCOUNTER — Other Ambulatory Visit: Payer: Self-pay

## 2017-11-28 ENCOUNTER — Inpatient Hospital Stay (HOSPITAL_COMMUNITY)
Admission: AD | Admit: 2017-11-28 | Discharge: 2017-11-28 | Disposition: A | Discharge status: Home / Self Care | Payer: Medicaid Other | Source: Ambulatory Visit | Attending: Obstetrics & Gynecology | Admitting: Obstetrics & Gynecology

## 2017-11-28 ENCOUNTER — Encounter (HOSPITAL_COMMUNITY): Payer: Self-pay | Admitting: *Deleted

## 2017-11-28 DIAGNOSIS — R109 Unspecified abdominal pain: Secondary | ICD-10-CM | POA: Diagnosis present

## 2017-11-28 DIAGNOSIS — Z8249 Family history of ischemic heart disease and other diseases of the circulatory system: Secondary | ICD-10-CM | POA: Diagnosis not present

## 2017-11-28 DIAGNOSIS — E119 Type 2 diabetes mellitus without complications: Secondary | ICD-10-CM | POA: Insufficient documentation

## 2017-11-28 DIAGNOSIS — Z7984 Long term (current) use of oral hypoglycemic drugs: Secondary | ICD-10-CM | POA: Insufficient documentation

## 2017-11-28 DIAGNOSIS — Z825 Family history of asthma and other chronic lower respiratory diseases: Secondary | ICD-10-CM | POA: Insufficient documentation

## 2017-11-28 DIAGNOSIS — Z79899 Other long term (current) drug therapy: Secondary | ICD-10-CM | POA: Insufficient documentation

## 2017-11-28 DIAGNOSIS — Z841 Family history of disorders of kidney and ureter: Secondary | ICD-10-CM | POA: Insufficient documentation

## 2017-11-28 DIAGNOSIS — F419 Anxiety disorder, unspecified: Secondary | ICD-10-CM | POA: Diagnosis not present

## 2017-11-28 DIAGNOSIS — R112 Nausea with vomiting, unspecified: Secondary | ICD-10-CM | POA: Diagnosis not present

## 2017-11-28 DIAGNOSIS — F909 Attention-deficit hyperactivity disorder, unspecified type: Secondary | ICD-10-CM | POA: Insufficient documentation

## 2017-11-28 DIAGNOSIS — F319 Bipolar disorder, unspecified: Secondary | ICD-10-CM | POA: Diagnosis not present

## 2017-11-28 DIAGNOSIS — Z87440 Personal history of urinary (tract) infections: Secondary | ICD-10-CM | POA: Diagnosis not present

## 2017-11-28 DIAGNOSIS — D573 Sickle-cell trait: Secondary | ICD-10-CM | POA: Diagnosis not present

## 2017-11-28 LAB — URINALYSIS, ROUTINE W REFLEX MICROSCOPIC
BILIRUBIN URINE: NEGATIVE
Bacteria, UA: NONE SEEN
Glucose, UA: NEGATIVE mg/dL
Ketones, ur: NEGATIVE mg/dL
LEUKOCYTES UA: NEGATIVE
Nitrite: NEGATIVE
PH: 6 (ref 5.0–8.0)
Protein, ur: NEGATIVE mg/dL
SPECIFIC GRAVITY, URINE: 1.025 (ref 1.005–1.030)

## 2017-11-28 LAB — WET PREP, GENITAL
CLUE CELLS WET PREP: NONE SEEN
SPERM: NONE SEEN
TRICH WET PREP: NONE SEEN
YEAST WET PREP: NONE SEEN

## 2017-11-28 LAB — POCT PREGNANCY, URINE: Preg Test, Ur: NEGATIVE

## 2017-11-28 NOTE — MAU Provider Note (Signed)
History     CSN: [id number]  Arrival date and time: 11/28/17 1649   First Provider Initiated Contact with Patient 11/28/17 1759      Chief Complaint  Patient presents with  . Abdominal Pain   HPI  ,Stephanie Mack is a 27 y.o. [phone] non pregnant patient who presents to MAU with complaints of nausea, vomiting x one episode, and abdominal cramping that occurred during her vomiting episode.   Patient also verbalizes concern for discomfort just prior to voiding. Unable to identify onset. Denies fever, falls, or recent illness.    Patient states her two year old daughter has just recovered from a viral illness.  OB History    Gravida  3   Para  2   Term  0   Preterm  2   AB  1   Living  1     SAB  1   TAB  0   Ectopic  0   Multiple  0   Live Births  2           Past Medical History:  Diagnosis Date  . ADHD (attention deficit hyperactivity disorder)   . Anemia   . Anxiety   . Bipolar 1 disorder ([place])   . Chlamydia 05-31-10  . Chronic constipation   . Depression    depression  . GERD (gastroesophageal reflux disease)    with pregnancy  . Gestational diabetes    current pregnancy  . Gonorrhea contact, treated   . Headache   . Infection    UTI  . Pregnancy induced hypertension    previous pregnancy  . Pseudocyesis 2013   Seen in MAU for percieved FM, abd distension. Normal exam.   . Sickle cell trait ([place])   . Type 2 diabetes mellitus ([place]) 02/13/2017    Past Surgical History:  Procedure Laterality Date  . CESAREAN SECTION N/A 09/01/2012   Procedure:  Primary cesarean section with delivery of baby girl at 16.  Apgars 1/1.  ;  Surgeon: Frederico Hamman, MD;  Location: [facility];  Service: Obstetrics;  Laterality: N/A;  . CESAREAN SECTION N/A 01/07/2015   Procedure: REPEAT CESAREAN SECTION;  Surgeon: Shelly Bombard, MD;  Location: [facility];  Service: Obstetrics;  Laterality: N/A;  . NASAL SEPTUM SURGERY    . WISDOM TOOTH EXTRACTION       Family History  Problem Relation Age of Onset  . Kidney disease Mother   . Hypertension Father   . Cancer Sister   . Asthma Brother   . Diabetes Maternal Grandfather   . Heart disease Neg Hx     Social History   Tobacco Use  . Smoking status: Never Smoker  . Smokeless tobacco: Never Used  Substance Use Topics  . Alcohol use: No    Alcohol/week: 0.0 standard drinks  . Drug use: No    Allergies: No Known Allergies  Medications Prior to Admission  Medication Sig Dispense Refill Last Dose  . ACCU-CHEK AVIVA PLUS test strip Check fasting blood sugar AND blood sugar TWO HOURS AFTER each meal (Patient not taking: Reported on 08/08/2017) 100 each prn Not Taking  . ACCU-CHEK SOFTCLIX LANCETS lancets Check blood sugar fasting AND TWO HOURS BEFORE each meal (Patient not taking: Reported on 08/08/2017) 100 each prn Not Taking  . gabapentin (NEURONTIN) 300 MG capsule Take 1 capsule (300 mg total) by mouth 2 (two) times daily. (Patient not taking: Reported on 11/20/2017) 60 capsule 1 Not Taking  . ibuprofen (ADVIL,MOTRIN)  600 MG tablet TAKE ONE TABLET BY MOUTH EVERY 6 HOURS AS NEEDED for mild pain (Patient not taking: Reported on 11/20/2017) 30 tablet 5 Not Taking  . ibuprofen (ADVIL,MOTRIN) 800 MG tablet Take 1 tablet (800 mg total) by mouth every 8 (eight) hours as needed. (Patient not taking: Reported on 11/20/2017) 30 tablet 5 Not Taking  . loratadine (CLARITIN) 10 MG tablet TAKE ONE TABLET BY MOUTH EVERY DAY (Patient not taking: No sig reported) 30 tablet 11 Not Taking  . lurasidone (LATUDA) 40 MG TABS tablet Take 40 mg by mouth daily with breakfast.   Taking  . medroxyPROGESTERone (DEPO-PROVERA) 150 MG/ML injection Inject 1 mL (150 mg total) into the muscle every 3 (three) months. (Patient not taking: Reported on 11/20/2017) 1 mL 4 Not Taking  . meloxicam (MOBIC) 15 MG tablet Take 1 tablet (15 mg total) by mouth daily as needed. (Patient not taking: Reported on 11/20/2017) 30 tablet 5 Not  Taking  . metFORMIN (GLUCOPHAGE) 500 MG tablet Take 1 tablet (500 mg total) by mouth daily with breakfast. 60 tablet 5 Taking  . OLANZapine (ZYPREXA) 5 MG tablet Take 5 mg by mouth at bedtime.   Not Taking  . pantoprazole (PROTONIX) 20 MG tablet Take 1 tablet (20 mg total) by mouth daily. (Patient not taking: Reported on 08/08/2017) 30 tablet 0 Not Taking  . Prenat-FeCbn-FeAsp-Meth-FA-DHA (PRENATE MINI) 18-0.6-0.4-350 MG CAPS TAKE one tablet BY MOUTH EVERY DAY (Patient not taking: Reported on 08/08/2017) 30 capsule 11 Not Taking  . ranitidine (ZANTAC) 150 MG tablet TAKE ONE TABLET BY MOUTH TWICE DAILY (Patient not taking: Reported on 08/08/2017) 60 tablet 11 Not Taking  . sertraline (ZOLOFT) 25 MG tablet Take 3 tablets (75 mg total) by mouth daily. (Patient not taking: Reported on 10/17/2017) 30 tablet 0 Not Taking    Review of Systems  Constitutional: Negative for chills, fatigue and fever.  Gastrointestinal: Positive for abdominal pain, nausea and vomiting.  Genitourinary: Positive for vaginal discharge. Negative for vaginal bleeding.  All other systems reviewed and are negative.  Physical Exam   Blood pressure 123/75, pulse 71, temperature 98 F (36.7 C), temperature source Oral, resp. rate 17, height 5\' 2"  (1.575 m), weight 61 kg, SpO2 100 %.  Physical Exam  Nursing note and vitals reviewed. Constitutional: She is oriented to person, place, and time. She appears well-developed and well-nourished.  Cardiovascular: Normal rate.  Respiratory: Effort normal.  GI: Soft.  Genitourinary: No vaginal discharge found.  Musculoskeletal: Normal range of motion.  Neurological: She is alert and oriented to person, place, and time. She has normal reflexes.  Skin: Skin is warm and dry.  Psychiatric: She has a normal mood and affect. Her behavior is normal. Judgment and thought content normal.    MAU Course  Procedures  MDM -- Patient Vitals for the past 24 hrs:  BP Temp Temp src Pulse Resp SpO2  Height Weight  11/28/17 1813 123/75 98 F (36.7 C) Oral 71 17 100 % - -  11/28/17 1729 108/69 98.3 F (36.8 C) Oral 76 18 100 % - -  11/28/17 1721 - - - - - - 5\' 2"  (1.575 m) 61 kg    Orders Placed This Encounter  Procedures  . Wet prep, genital    Standing Status:   Standing    Number of Occurrences:   1  . Urinalysis, Routine w reflex microscopic    Standing Status:   Standing    Number of Occurrences:   1  . Pregnancy,  urine POC    Standing Status:   Standing    Number of Occurrences:   1  . Discharge patient    Order Specific Question:   Discharge disposition    Answer:   01-Home or Self Care [1]    Order Specific Question:   Discharge patient date    Answer:   11/28/2017   Results for orders placed or performed during the hospital encounter of 11/28/17 (from the past 24 hour(s))  Urinalysis, Routine w reflex microscopic     Status: Abnormal   Collection Time: 11/28/17  5:48 PM  Result Value Ref Range   Color, Urine YELLOW YELLOW   APPearance CLEAR CLEAR   Specific Gravity, Urine 1.025 1.005 - 1.030   pH 6.0 5.0 - 8.0   Glucose, UA NEGATIVE NEGATIVE mg/dL   Hgb urine dipstick SMALL (A) NEGATIVE   Bilirubin Urine NEGATIVE NEGATIVE   Ketones, ur NEGATIVE NEGATIVE mg/dL   Protein, ur NEGATIVE NEGATIVE mg/dL   Nitrite NEGATIVE NEGATIVE   Leukocytes, UA NEGATIVE NEGATIVE   RBC / HPF 0-5 0 - 5 RBC/hpf   WBC, UA 0-5 0 - 5 WBC/hpf   Bacteria, UA NONE SEEN NONE SEEN   Squamous Epithelial / LPF 0-5 0 - 5   Mucus PRESENT   Pregnancy, urine POC     Status: None   Collection Time: 11/28/17  5:52 PM  Result Value Ref Range   Preg Test, Ur NEGATIVE NEGATIVE  Wet prep, genital     Status: Abnormal   Collection Time: 11/28/17  6:15 PM  Result Value Ref Range   Yeast Wet Prep HPF POC NONE SEEN NONE SEEN   Trich, Wet Prep NONE SEEN NONE SEEN   Clue Cells Wet Prep HPF POC NONE SEEN NONE SEEN   WBC, Wet Prep HPF POC FEW (A) NONE SEEN   Sperm NONE SEEN    Assessment and Plan   --Occasional nausea with single episode of vomiting, probably viral origin, self-limiting --Patient tolerating PO as recently as lunch at 2pm --Discharge home in stable condition  [name], CNM 11/28/2017, 6:24 PM

## 2017-11-28 NOTE — Discharge Instructions (Signed)
Nausea, Adult Feeling sick to your stomach (nausea) means that your stomach is upset or you feel like you have to throw up (vomit). Feeling sick to your stomach is usually not serious, but it may be an early sign of a more serious medical problem. As you feel sicker to your stomach, it can lead to throwing up (vomiting). If you throw up, or if you are not able to drink enough fluids, there is a risk of dehydration. Dehydration can make you feel tired and thirsty, have a dry mouth, and pee (urinate) less often. Older adults and people who have other diseases or a weak defense (immune) system have a higher risk of dehydration. The main goal of treating this condition is to:  Limit how often you feel sick to your stomach.  Prevent throwing up and dehydration.  Follow these instructions at home: Follow instructions from your doctor about how to care for yourself at home. Eating and drinking Follow these recommendations as told by your doctor:  Take an oral rehydration solution (ORS). This is a drink that is sold at pharmacies and stores.  Drink clear fluids in small amounts as you are able, such as: ? Water. ? Ice chips. ? Fruit juice that has water added (diluted fruit juice). ? Low-calorie sports drinks.  Eat bland, easy to digest foods in small amounts as you are able, such as: ? Bananas. ? Applesauce. ? Rice. ? Lean meats. ? Toast. ? Crackers.  Avoid drinking fluids that contain a lot of sugar or caffeine.  Avoid alcohol.  Avoid spicy or fatty foods.  General instructions  Drink enough fluid to keep your pee (urine) clear or pale yellow.  Wash your hands often. If you cannot use soap and water, use hand sanitizer.  Make sure that all people in your household wash their hands well and often.  Rest at home while you get better.  Take over-the-counter and prescription medicines only as told by your doctor.  Breathe slowly and deeply when you feel sick to your  stomach.  Watch your condition for any changes.  Keep all follow-up visits as told by your doctor. This is important. Contact a doctor if:  You have a headache.  You have new symptoms.  You feel sicker to your stomach.  You have a fever.  You feel light-headed or dizzy.  You throw up.  You are not able to keep fluids down. Get help right away if:  You have pain in your chest, neck, arm, or jaw.  You feel very weak or you pass out (faint).  You have throw up that is bright red or looks like coffee grounds.  You have bloody or black poop (stools), or poop that looks like tar.  You have a very bad headache, a stiff neck, or both.  You have very bad pain, cramping, or bloating in your belly.  You have a rash.  You have trouble breathing or you are breathing very quickly.  Your heart is beating very quickly.  Your skin feels cold and clammy.  You feel confused.  You have pain while peeing.  You have signs of dehydration, such as: ? Dark pee, or very little or no pee. ? Cracked lips. ? Dry mouth. ? Sunken eyes. ? Sleepiness. ? Weakness. These symptoms may be an emergency. Do not wait to see if the symptoms will go away. Get medical help right away. Call your local emergency services (911 in the [place] Do not drive yourself to   the hospital. This information is not intended to replace advice given to you by your health care provider. Make sure you discuss any questions you have with your health care provider. Document Released: 03/10/2011 Document Revised: 08/27/2015 Document Reviewed: 11/25/2014 Elsevier Interactive Patient Education  2018 [employer]  

## 2017-11-28 NOTE — MAU Note (Addendum)
Pt presents with c/o lower abdominal pain/cramping that began last night.  Denies VB.  Also c/o H/A since 1630 this afternoon.  Hasn't taken any meds. No HPT.  LMP 7/??/2019

## 2017-11-28 NOTE — Progress Notes (Signed)
Wet prep obtained and sent to lab.

## 2017-11-30 ENCOUNTER — Ambulatory Visit: Payer: Self-pay | Admitting: Family Medicine

## 2017-12-05 ENCOUNTER — Ambulatory Visit: Payer: Medicaid Other | Admitting: Obstetrics

## 2017-12-05 ENCOUNTER — Ambulatory Visit: Payer: Self-pay

## 2017-12-16 ENCOUNTER — Other Ambulatory Visit: Payer: Self-pay

## 2017-12-16 ENCOUNTER — Inpatient Hospital Stay (HOSPITAL_COMMUNITY): Admission: AD | Admit: 2017-12-16 | Payer: Self-pay | Source: Ambulatory Visit

## 2017-12-16 ENCOUNTER — Encounter (HOSPITAL_COMMUNITY): Payer: Self-pay

## 2017-12-16 ENCOUNTER — Inpatient Hospital Stay (HOSPITAL_COMMUNITY)
Admission: AD | Admit: 2017-12-16 | Discharge: 2017-12-16 | Disposition: A | Discharge status: Home / Self Care | Payer: Medicaid Other | Source: Ambulatory Visit | Attending: Obstetrics and Gynecology | Admitting: Obstetrics and Gynecology

## 2017-12-16 DIAGNOSIS — R112 Nausea with vomiting, unspecified: Secondary | ICD-10-CM | POA: Diagnosis present

## 2017-12-16 DIAGNOSIS — K5909 Other constipation: Secondary | ICD-10-CM | POA: Insufficient documentation

## 2017-12-16 DIAGNOSIS — F419 Anxiety disorder, unspecified: Secondary | ICD-10-CM | POA: Diagnosis not present

## 2017-12-16 DIAGNOSIS — Z7984 Long term (current) use of oral hypoglycemic drugs: Secondary | ICD-10-CM | POA: Diagnosis not present

## 2017-12-16 DIAGNOSIS — F909 Attention-deficit hyperactivity disorder, unspecified type: Secondary | ICD-10-CM | POA: Insufficient documentation

## 2017-12-16 DIAGNOSIS — F319 Bipolar disorder, unspecified: Secondary | ICD-10-CM | POA: Insufficient documentation

## 2017-12-16 DIAGNOSIS — K30 Functional dyspepsia: Secondary | ICD-10-CM | POA: Diagnosis not present

## 2017-12-16 DIAGNOSIS — K219 Gastro-esophageal reflux disease without esophagitis: Secondary | ICD-10-CM | POA: Diagnosis not present

## 2017-12-16 DIAGNOSIS — Z833 Family history of diabetes mellitus: Secondary | ICD-10-CM | POA: Insufficient documentation

## 2017-12-16 DIAGNOSIS — E119 Type 2 diabetes mellitus without complications: Secondary | ICD-10-CM | POA: Insufficient documentation

## 2017-12-16 DIAGNOSIS — D573 Sickle-cell trait: Secondary | ICD-10-CM | POA: Insufficient documentation

## 2017-12-16 DIAGNOSIS — R109 Unspecified abdominal pain: Secondary | ICD-10-CM | POA: Insufficient documentation

## 2017-12-16 DIAGNOSIS — R197 Diarrhea, unspecified: Secondary | ICD-10-CM | POA: Diagnosis not present

## 2017-12-16 LAB — URINALYSIS, ROUTINE W REFLEX MICROSCOPIC
Bilirubin Urine: NEGATIVE
GLUCOSE, UA: 50 mg/dL — AB
Hgb urine dipstick: NEGATIVE
Ketones, ur: NEGATIVE mg/dL
Leukocytes, UA: NEGATIVE
Nitrite: NEGATIVE
PH: 5 (ref 5.0–8.0)
Protein, ur: 30 mg/dL — AB
SPECIFIC GRAVITY, URINE: 1.027 (ref 1.005–1.030)

## 2017-12-16 LAB — POCT PREGNANCY, URINE: PREG TEST UR: NEGATIVE

## 2017-12-16 MED ORDER — ONDANSETRON 8 MG PO TBDP
8.0000 mg | ORAL_TABLET | Freq: Once | ORAL | Status: AC
  Administered 2017-12-16: 8 mg via ORAL
  Filled 2017-12-16: qty 1

## 2017-12-16 MED ORDER — ONDANSETRON HCL 4 MG PO TABS: 4.0000 mg | ORAL_TABLET | Freq: Four times a day (QID) | ORAL | 0 refills | Status: DC

## 2017-12-16 NOTE — MAU Provider Note (Signed)
History     CSN: [id number]  Arrival date and time: 12/16/17 1321   First Provider Initiated Contact with Patient 12/16/17 1357      Chief Complaint  Patient presents with  . Nausea  . Emesis  . Diarrhea  . Abdominal Pain   HPI  Stephanie Mack is a 27 y.o. [phone] non-pregnant female who presents to MAU today with complaint of N/V/D and abdominal pain since last night. She states ~ 2 episodes and vomiting and diarrhea since 2100 yesterday. She has had subjective fever and chills. She did not take her temperature. She is on Metformin for DM and [facility]. She has noted some associated lower abdominal cramping. She denies bleeding or discharge. She was on Depo Provera for birth control but missed her last injection ~ 2 weeks ago. She does not want to continue Depo provera due to weight loss and perceived interactions with her Metformin. She is unsure of her plan for birth control at this time. The patient was seen with similar symptoms just a few weeks ago as well that were self-limiting.   OB History    Gravida  3   Para  2   Term  0   Preterm  2   AB  1   Living  1     SAB  1   TAB  0   Ectopic  0   Multiple  0   Live Births  2           Past Medical History:  Diagnosis Date  . ADHD (attention deficit hyperactivity disorder)   . Anemia   . Anxiety   . Bipolar 1 disorder ([place])   . Chlamydia 05-31-10  . Chronic constipation   . Depression    depression  . GERD (gastroesophageal reflux disease)    with pregnancy  . Gestational diabetes    current pregnancy  . Gonorrhea contact, treated   . Headache   . Infection    UTI  . Pregnancy induced hypertension    previous pregnancy  . Pseudocyesis 2013   Seen in MAU for percieved FM, abd distension. Normal exam.   . Sickle cell trait ([place])   . Type 2 diabetes mellitus ([place]) 02/13/2017    Past Surgical History:  Procedure Laterality Date  . CESAREAN SECTION N/A 09/01/2012   Procedure:  Primary cesarean section with delivery of baby girl at 85.  Apgars 1/1.  ;  Surgeon: Frederico Hamman, MD;  Location: [facility];  Service: Obstetrics;  Laterality: N/A;  . CESAREAN SECTION N/A 01/07/2015   Procedure: REPEAT CESAREAN SECTION;  Surgeon: Shelly Bombard, MD;  Location: [facility];  Service: Obstetrics;  Laterality: N/A;  . NASAL SEPTUM SURGERY    . WISDOM TOOTH EXTRACTION      Family History  Problem Relation Age of Onset  . Kidney disease Mother   . Hypertension Father   . Cancer Sister   . Asthma Brother   . Diabetes Maternal Grandfather   . Heart disease Neg Hx     Social History   Tobacco Use  . Smoking status: Never Smoker  . Smokeless tobacco: Never Used  Substance Use Topics  . Alcohol use: No    Alcohol/week: 0.0 standard drinks  . Drug use: No    Allergies: No Known Allergies  No medications prior to admission.    Review of Systems  Constitutional: Positive for chills and fever.  Gastrointestinal: Positive for abdominal  pain, diarrhea and nausea. Negative for constipation and vomiting.  Genitourinary: Negative for dysuria, frequency, urgency, vaginal bleeding and vaginal discharge.   Physical Exam   Blood pressure 121/63, pulse 85, temperature 98.9 F (37.2 C), temperature source Oral, resp. rate 20, height 5\' 2"  (1.575 m), weight 60.8 kg, SpO2 98 %.  Physical Exam  Nursing note and vitals reviewed. Constitutional: She is oriented to person, place, and time. She appears well-developed and well-nourished. No distress.  HENT:  Head: Normocephalic and atraumatic.  Cardiovascular: Normal rate.  Respiratory: Effort normal.  GI: Soft. Bowel sounds are normal. She exhibits no distension and no mass. There is no tenderness. There is no rebound and no guarding.  Neurological: She is alert and oriented to person, place, and time.  Skin: Skin is warm and dry. No erythema.  Psychiatric: She has a normal mood and affect.    Results for orders  placed or performed during the hospital encounter of 12/16/17 (from the past 24 hour(s))  Urinalysis, Routine w reflex microscopic     Status: Abnormal   Collection Time: 12/16/17  1:35 PM  Result Value Ref Range   Color, Urine YELLOW YELLOW   APPearance HAZY (A) CLEAR   Specific Gravity, Urine 1.027 1.005 - 1.030   pH 5.0 5.0 - 8.0   Glucose, UA 50 (A) NEGATIVE mg/dL   Hgb urine dipstick NEGATIVE NEGATIVE   Bilirubin Urine NEGATIVE NEGATIVE   Ketones, ur NEGATIVE NEGATIVE mg/dL   Protein, ur 30 (A) NEGATIVE mg/dL   Nitrite NEGATIVE NEGATIVE   Leukocytes, UA NEGATIVE NEGATIVE   RBC / HPF 0-5 0 - 5 RBC/hpf   WBC, UA 0-5 0 - 5 WBC/hpf   Bacteria, UA RARE (A) NONE SEEN   Squamous Epithelial / LPF 6-10 0 - 5   Mucus PRESENT   Pregnancy, urine POC     Status: None   Collection Time: 12/16/17  1:36 PM  Result Value Ref Range   Preg Test, Ur NEGATIVE NEGATIVE    MAU Course  Procedures None  MDM UPT - negative UA today without evidence of dehydration  8 mg ODT Zofran given in MAU - patient reports improvement in symptoms No episodes of diarrhea or emesis while in MAU Assessment and Plan  A: Viral gastroenteritis vs side effect of Metformin  P: Discharge home Rx for Zofran given to patient  Warning signs for worsening condition discussed Patient advised of safe sex practices given outdated Depo status  Patient advised to follow-up with PCP at [facility] regarding frequent GI upset likely related to Metformin Patient advised to follow-up with GYN or PCP of choice to initiate a new birth control of her choice Patient may return to MAU as needed or if her condition were to change or worsen  Kerry Hough, PA-C 12/16/2017, 4:16 PM

## 2017-12-16 NOTE — MAU Note (Signed)
Pt. States she has vomited twice since 9pm last night.  She has also had 2 episodes of diarrhea since yesterday. Pt reports lower abd. Pain.   Pt. Is unsure of LMP and is not currently taking any birth control.  Pt. Denies any vag. Bleeding.

## 2017-12-16 NOTE — Discharge Instructions (Signed)
Food Choices to Help Relieve Diarrhea, Adult When you have diarrhea, the foods you eat and your eating habits are very important. Choosing the right foods and drinks can help:  Relieve diarrhea.  Replace lost fluids and nutrients.  Prevent dehydration.  What general guidelines should I follow? Relieving diarrhea  Choose foods with less than 2 g or .07 oz. of fiber per serving.  Limit fats to less than 8 tsp (38 g or 1.34 oz.) a day.  Avoid the following: ? Foods and beverages sweetened with high-fructose corn syrup, honey, or sugar alcohols such as xylitol, sorbitol, and mannitol. ? Foods that contain a lot of fat or sugar. ? Fried, greasy, or spicy foods. ? High-fiber grains, breads, and cereals. ? Raw fruits and vegetables.  Eat foods that are rich in probiotics. These foods include dairy products such as yogurt and fermented milk products. They help increase healthy bacteria in the stomach and intestines (gastrointestinal tract, or GI tract).  If you have lactose intolerance, avoid dairy products. These may make your diarrhea worse.  Take medicine to help stop diarrhea (antidiarrheal medicine) only as told by your health care provider. Replacing nutrients  Eat small meals or snacks every 3-4 hours.  Eat bland foods, such as white rice, toast, or baked potato, until your diarrhea starts to get better. Gradually reintroduce nutrient-rich foods as tolerated or as told by your health care provider. This includes: ? Well-cooked protein foods. ? Peeled, seeded, and soft-cooked fruits and vegetables. ? Low-fat dairy products.  Take vitamin and mineral supplements as told by your health care provider. Preventing dehydration   Start by sipping water or a special solution to prevent dehydration (oral rehydration solution, ORS). Urine that is clear or pale yellow means that you are getting enough fluid.  Try to drink at least 8-10 cups of fluid each day to help replace lost  fluids.  You may add other liquids in addition to water, such as clear juice or decaffeinated sports drinks, as tolerated or as told by your health care provider.  Avoid drinks with caffeine, such as coffee, tea, or soft drinks.  Avoid alcohol. What foods are recommended? The items listed may not be a complete list. Talk with your health care provider about what dietary choices are best for you. Grains White rice. White, [place], or pita breads (fresh or toasted), including plain rolls, buns, or bagels. White pasta. Saltine, soda, or graham crackers. Pretzels. Low-fiber cereal. Cooked cereals made with water (such as cornmeal, farina, or cream cereals). Plain muffins. Matzo. Melba toast. Zwieback. Vegetables Potatoes (without the skin). Most well-cooked and canned vegetables without skins or seeds. Tender lettuce. Fruits Apple sauce. Fruits canned in juice. Cooked apricots, cherries, grapefruit, peaches, pears, or plums. Fresh bananas and cantaloupe. Meats and other protein foods Baked or boiled chicken. Eggs. Tofu. Fish. Seafood. Smooth nut butters. Ground or well-cooked tender beef, ham, veal, lamb, pork, or poultry. Dairy Plain yogurt, kefir, and unsweetened liquid yogurt. Lactose-free milk, buttermilk, skim milk, or soy milk. Low-fat or nonfat hard cheese. Beverages Water. Low-calorie sports drinks. Fruit juices without pulp. Strained tomato and vegetable juices. Decaffeinated teas. Sugar-free beverages not sweetened with sugar alcohols. Oral rehydration solutions, if approved by your health care provider. Seasoning and other foods Bouillon, broth, or soups made from recommended foods. What foods are not recommended? The items listed may not be a complete list. Talk with your health care provider about what dietary choices are best for you. Grains Whole grain, whole wheat,  bran, or rye breads, rolls, pastas, and crackers. Wild or brown rice. Whole grain or bran cereals. Barley. Oats and  oatmeal. Corn tortillas or taco shells. Granola. Popcorn. Vegetables Raw vegetables. Fried vegetables. Cabbage, broccoli, Brussels sprouts, artichokes, baked beans, beet greens, corn, kale, legumes, peas, sweet potatoes, and yams. Potato skins. Cooked spinach and cabbage. Fruits Dried fruit, including raisins and dates. Raw fruits. Stewed or dried prunes. Canned fruits with syrup. Meat and other protein foods Fried or fatty meats. Deli meats. Chunky nut butters. Nuts and seeds. Beans and lentils. Berniece Salines. Hot dogs. Sausage. Dairy High-fat cheeses. Whole milk, chocolate milk, and beverages made with milk, such as milk shakes. Half-and-half. Cream. sour cream. Ice cream. Beverages Caffeinated beverages (such as coffee, tea, soda, or energy drinks). Alcoholic beverages. Fruit juices with pulp. Prune juice. Soft drinks sweetened with high-fructose corn syrup or sugar alcohols. High-calorie sports drinks. Fats and oils Butter. Cream sauces. Margarine. Salad oils. Plain salad dressings. Olives. Avocados. Mayonnaise. Sweets and desserts Sweet rolls, doughnuts, and sweet breads. Sugar-free desserts sweetened with sugar alcohols such as xylitol and sorbitol. Seasoning and other foods Honey. Hot sauce. Chili powder. Gravy. Cream-based or milk-based soups. Pancakes and waffles. Summary  When you have diarrhea, the foods you eat and your eating habits are very important.  Make sure you get at least 8-10 cups of fluid each day, or enough to keep your urine clear or pale yellow.  Eat bland foods and gradually reintroduce healthy, nutrient-rich foods as tolerated, or as told by your health care provider.  Avoid high-fiber, fried, greasy, or spicy foods. This information is not intended to replace advice given to you by your health care provider. Make sure you discuss any questions you have with your health care provider. Document Released: 06/11/2003 Document Revised: 03/18/2016 Document Reviewed:  03/18/2016 Elsevier Interactive Patient Education  2018 [employer]. Metformin tablets What is this medicine? METFORMIN (met FOR min) is used to treat type 2 diabetes. It helps to control blood sugar. Treatment is combined with diet and exercise. This medicine can be used alone or with other medicines for diabetes. This medicine may be used for other purposes; ask your health care provider or pharmacist if you have questions. COMMON BRAND NAME(S): Glucophage What should I tell my health care provider before I take this medicine? They need to know if you have any of these conditions: -anemia -dehydration -heart disease -frequently drink alcohol-containing beverages -kidney disease -liver disease -polycystic ovary syndrome -serious infection or injury -vomiting -an unusual or allergic reaction to metformin, other medicines, foods, dyes, or preservatives -pregnant or trying to get pregnant -breast-feeding How should I use this medicine? Take this medicine by mouth with a glass of water. Follow the directions on the prescription label. Take this medicine with food. Take your medicine at regular intervals. Do not take your medicine more often than directed. Do not stop taking except on your doctor's advice. Talk to your pediatrician regarding the use of this medicine in children. While this drug may be prescribed for children as young as 21 years of age for selected conditions, precautions do apply. Overdosage: If you think you have taken too much of this medicine contact a poison control center or emergency room at once. NOTE: This medicine is only for you. Do not share this medicine with others. What if I miss a dose? If you miss a dose, take it as soon as you can. If it is almost time for your next dose, take only that dose.  Do not take double or extra doses. What may interact with this medicine? Do not take this medicine with any of the following medications: -dofetilide -certain  contrast medicines given before X-rays, CT scans, MRI, or other procedures This medicine may also interact with the following medications: -acetazolamide -certain antiviral medicines for HIV or AIDS or for hepatitis, like adefovir, dolutegravir, emtricitabine, entecavir, lamivudine, paritaprevir, or tenofovir -cimetidine -cobicistat -crizotinib -dichlorphenamide -digoxin -diuretics -female hormones, like estrogens or progestins and birth control pills -glycopyrrolate -isoniazid -lamotrigine -medicines for blood pressure, heart disease, irregular heart beat -memantine -midodrine -methazolamide -morphine -niacin -phenothiazines like chlorpromazine, mesoridazine, prochlorperazine, thioridazine -phenytoin -procainamide -propantheline -quinidine -quinine -ranitidine -ranolazine -steroid medicines like prednisone or cortisone -stimulant medicines for attention disorders, weight loss, or to stay awake -thyroid medicines -topiramate -trimethoprim -trospium -vancomycin -vandetanib -zonisamide This list may not describe all possible interactions. Give your health care provider a list of all the medicines, herbs, non-prescription drugs, or dietary supplements you use. Also tell them if you smoke, drink alcohol, or use illegal drugs. Some items may interact with your medicine. What should I watch for while using this medicine? Visit your doctor or health care professional for regular checks on your progress. A test called the HbA1C (A1C) will be monitored. This is a simple blood test. It measures your blood sugar control over the last 2 to 3 months. You will receive this test every 3 to 6 months. Learn how to check your blood sugar. Learn the symptoms of low and high blood sugar and how to manage them. Always carry a quick-source of sugar with you in case you have symptoms of low blood sugar. Examples include hard sugar candy or glucose tablets. Make sure others know that you can choke  if you eat or drink when you develop serious symptoms of low blood sugar, such as seizures or unconsciousness. They must get medical help at once. Tell your doctor or health care professional if you have high blood sugar. You might need to change the dose of your medicine. If you are sick or exercising more than usual, you might need to change the dose of your medicine. Do not skip meals. Ask your doctor or health care professional if you should avoid alcohol. Many nonprescription cough and cold products contain sugar or alcohol. These can affect blood sugar. This medicine may cause ovulation in premenopausal women who do not have regular monthly periods. This may increase your chances of becoming pregnant. You should not take this medicine if you become pregnant or think you may be pregnant. Talk with your doctor or health care professional about your birth control options while taking this medicine. Contact your doctor or health care professional right away if think you are pregnant. If you are going to need surgery, a MRI, CT scan, or other procedure, tell your doctor that you are taking this medicine. You may need to stop taking this medicine before the procedure. Wear a medical ID bracelet or chain, and carry a card that describes your disease and details of your medicine and dosage times. What side effects may I notice from receiving this medicine? Side effects that you should report to your doctor or health care professional as soon as possible: -allergic reactions like skin rash, itching or hives, swelling of the face, lips, or tongue -breathing problems -feeling faint or lightheaded, falls -muscle aches or pains -signs and symptoms of low blood sugar such as feeling anxious, confusion, dizziness, increased hunger, unusually weak or tired, sweating, shakiness, cold,  irritable, headache, blurred vision, fast heartbeat, loss of consciousness -slow or irregular heartbeat -unusual stomach pain or  discomfort -unusually tired or weak Side effects that usually do not require medical attention (report to your doctor or health care professional if they continue or are bothersome): -diarrhea -headache -heartburn -metallic taste in mouth -nausea -stomach gas, upset This list may not describe all possible side effects. Call your doctor for medical advice about side effects. You may report side effects to FDA at 1-800-FDA-1088. Where should I keep my medicine? Keep out of the reach of children. Store at room temperature between 15 and 30 degrees C (59 and 86 degrees F). Protect from moisture and light. Throw away any unused medicine after the expiration date. NOTE: This sheet is a summary. It may not cover all possible information. If you have questions about this medicine, talk to your doctor, pharmacist, or health care provider.  2018 Elsevier/Gold Standard (2015-09-30 15:34:19)

## 2017-12-21 ENCOUNTER — Encounter: Payer: Self-pay | Admitting: Obstetrics

## 2017-12-21 ENCOUNTER — Other Ambulatory Visit (HOSPITAL_COMMUNITY)
Admission: RE | Admit: 2017-12-21 | Discharge: 2017-12-21 | Disposition: A | Discharge status: Home / Self Care | Payer: Medicaid Other | Source: Ambulatory Visit | Attending: Obstetrics | Admitting: Obstetrics

## 2017-12-21 ENCOUNTER — Ambulatory Visit: Payer: Medicaid Other | Admitting: Obstetrics

## 2017-12-21 VITALS — BP 133/91 | HR 94 | Ht 62.0 in | Wt 136.4 lb

## 2017-12-21 DIAGNOSIS — Z3202 Encounter for pregnancy test, result negative: Secondary | ICD-10-CM

## 2017-12-21 DIAGNOSIS — N898 Other specified noninflammatory disorders of vagina: Secondary | ICD-10-CM

## 2017-12-21 DIAGNOSIS — N719 Inflammatory disease of uterus, unspecified: Secondary | ICD-10-CM

## 2017-12-21 DIAGNOSIS — R109 Unspecified abdominal pain: Secondary | ICD-10-CM

## 2017-12-21 LAB — POCT URINALYSIS DIPSTICK
BILIRUBIN UA: NEGATIVE
Glucose, UA: NEGATIVE
Ketones, UA: NEGATIVE
LEUKOCYTES UA: NEGATIVE
Nitrite, UA: NEGATIVE
ODOR: NEGATIVE
Protein, UA: POSITIVE — AB
RBC UA: NEGATIVE
Spec Grav, UA: 1.01 (ref 1.010–1.025)
UROBILINOGEN UA: 0.2 U/dL
pH, UA: 6 (ref 5.0–8.0)

## 2017-12-21 LAB — POCT URINE PREGNANCY: Preg Test, Ur: NEGATIVE

## 2017-12-21 MED ORDER — DOXYCYCLINE HYCLATE 100 MG PO CAPS: 100.0000 mg | ORAL_CAPSULE | Freq: Two times a day (BID) | ORAL | 0 refills | Status: DC

## 2017-12-21 MED ORDER — METRONIDAZOLE 500 MG PO TABS: 500.0000 mg | ORAL_TABLET | Freq: Two times a day (BID) | ORAL | 0 refills | Status: DC

## 2017-12-21 NOTE — Progress Notes (Signed)
Patient ID: Stephanie Mack, female   DOB: [birth year], 27 y.o.   MRN: [id number]  Chief Complaint  Patient presents with  . Abdominal Pain    HPI Stephanie Mack is a 27 y.o. female.  Lower abdominal pain for past few weeks.  Denies change in vaginal discharge, dysuria, fever / chills or abnormal vaginal bleeding. HPI  Past Medical History:  Diagnosis Date  . ADHD (attention deficit hyperactivity disorder)   . Anemia   . Anxiety   . Bipolar 1 disorder ([place])   . Chlamydia 05-31-10  . Chronic constipation   . Depression    depression  . GERD (gastroesophageal reflux disease)    with pregnancy  . Gestational diabetes    current pregnancy  . Gonorrhea contact, treated   . Headache   . Infection    UTI  . Pregnancy induced hypertension    previous pregnancy  . Pseudocyesis 2013   Seen in MAU for percieved FM, abd distension. Normal exam.   . Sickle cell trait ([place])   . Type 2 diabetes mellitus ([place]) 02/13/2017    Past Surgical History:  Procedure Laterality Date  . CESAREAN SECTION N/A 09/01/2012   Procedure:  Primary cesarean section with delivery of baby girl at 72.  Apgars 1/1.  ;  Surgeon: Frederico Hamman, MD;  Location: [facility];  Service: Obstetrics;  Laterality: N/A;  . CESAREAN SECTION N/A 01/07/2015   Procedure: REPEAT CESAREAN SECTION;  Surgeon: Shelly Bombard, MD;  Location: [facility];  Service: Obstetrics;  Laterality: N/A;  . NASAL SEPTUM SURGERY    . WISDOM TOOTH EXTRACTION      Family History  Problem Relation Age of Onset  . Kidney disease Mother   . Hypertension Father   . Cancer Sister   . Asthma Brother   . Diabetes Maternal Grandfather   . Heart disease Neg Hx     Social History Social History   Tobacco Use  . Smoking status: Never Smoker  . Smokeless tobacco: Never Used  Substance Use Topics  . Alcohol use: No    Alcohol/week: 0.0 standard drinks  . Drug use: No    No Known Allergies  Current Outpatient Medications  Medication Sig  Dispense Refill  . Lurasidone HCl (LATUDA PO) Take by mouth.    . metFORMIN (GLUCOPHAGE) 500 MG tablet Take 500 mg by mouth 2 (two) times daily with a meal.    . doxycycline (VIBRAMYCIN) 100 MG capsule Take 1 capsule (100 mg total) by mouth 2 (two) times daily. 28 capsule 0  . metroNIDAZOLE (FLAGYL) 500 MG tablet Take 1 tablet (500 mg total) by mouth 2 (two) times daily. 28 tablet 0  . ondansetron (ZOFRAN) 4 MG tablet Take 1 tablet (4 mg total) by mouth every 6 (six) hours. (Patient not taking: Reported on 12/21/2017) 12 tablet 0   No current facility-administered medications for this visit.     Review of Systems Review of Systems Constitutional: negative for fatigue and weight loss Respiratory: negative for cough and wheezing Cardiovascular: negative for chest pain, fatigue and palpitations Gastrointestinal: positive for abdominal pain and negative for constipation or diarrhea Genitourinary:positive for pelvic pain Integument/breast: negative for nipple discharge Musculoskeletal:negative for myalgias Neurological: negative for gait problems and tremors Behavioral/Psych: negative for abusive relationship, depression Endocrine: negative for temperature intolerance      Blood pressure (!) 133/91, pulse 94, height 5\' 2"  (1.575 m), weight 136 lb 6.4 oz (61.9 kg).  Physical Exam Physical Exam  General:  Alert and no distress Abdomen:  normal findings: no organomegaly, soft, non-tender and no hernia  Pelvis:  External genitalia: normal general appearance Urinary system: urethral meatus normal and bladder without fullness, nontender Vaginal: normal without tenderness, induration or masses Cervix: normal appearance Adnexa: normal bimanual exam Uterus: anteverted and non-tender, normal size    50% of 15 min visit spent on counseling and coordination of care.   Data Reviewed Wet Prep Cultures  Assessment     1. Abdominal cramping Rx: - POCT urine pregnancy - POCT  Urinalysis Dipstick  2. Vaginal discharge Rx: - Cervicovaginal ancillary only  3. Endometritis Rx: - doxycycline (VIBRAMYCIN) 100 MG capsule; Take 1 capsule (100 mg total) by mouth 2 (two) times daily.  Dispense: 28 capsule; Refill: 0 - metroNIDAZOLE (FLAGYL) 500 MG tablet; Take 1 tablet (500 mg total) by mouth 2 (two) times daily.  Dispense: 28 tablet; Refill: 0    Plan    Follow up in 2 weeks for pelvic exam  Orders Placed This Encounter  Procedures  . POCT urine pregnancy  . POCT Urinalysis Dipstick   Meds ordered this encounter  Medications  . doxycycline (VIBRAMYCIN) 100 MG capsule    Sig: Take 1 capsule (100 mg total) by mouth 2 (two) times daily.    Dispense:  28 capsule    Refill:  0  . metroNIDAZOLE (FLAGYL) 500 MG tablet    Sig: Take 1 tablet (500 mg total) by mouth 2 (two) times daily.    Dispense:  28 tablet    Refill:  0     Shelly Bombard MD 12-21-2017

## 2017-12-21 NOTE — Progress Notes (Signed)
Pt c/o low abdominal cramping, N/V, hot flashes x 2 wks.

## 2017-12-25 LAB — CERVICOVAGINAL ANCILLARY ONLY
BACTERIAL VAGINITIS: NEGATIVE
CHLAMYDIA, DNA PROBE: NEGATIVE
Candida vaginitis: NEGATIVE
NEISSERIA GONORRHEA: NEGATIVE
Trichomonas: NEGATIVE

## 2017-12-28 ENCOUNTER — Ambulatory Visit (INDEPENDENT_AMBULATORY_CARE_PROVIDER_SITE_OTHER): Payer: Medicaid Other | Admitting: Family Medicine

## 2017-12-28 ENCOUNTER — Encounter: Payer: Self-pay | Admitting: Family Medicine

## 2017-12-28 VITALS — BP 126/72 | HR 70 | Temp 98.4°F | Ht 62.0 in | Wt 136.0 lb

## 2017-12-28 DIAGNOSIS — G629 Polyneuropathy, unspecified: Secondary | ICD-10-CM | POA: Diagnosis not present

## 2017-12-28 DIAGNOSIS — R110 Nausea: Secondary | ICD-10-CM

## 2017-12-28 DIAGNOSIS — R8290 Unspecified abnormal findings in urine: Secondary | ICD-10-CM | POA: Diagnosis not present

## 2017-12-28 DIAGNOSIS — E119 Type 2 diabetes mellitus without complications: Secondary | ICD-10-CM | POA: Diagnosis not present

## 2017-12-28 LAB — POCT URINALYSIS DIP (MANUAL ENTRY)
Bilirubin, UA: NEGATIVE
Glucose, UA: NEGATIVE mg/dL
Ketones, POC UA: NEGATIVE mg/dL
Nitrite, UA: NEGATIVE
Protein Ur, POC: 30 mg/dL — AB
Spec Grav, UA: >=1.03 — AB (ref 1.010–1.025)
Urobilinogen, UA: 0.2 E.U./dL
pH, UA: 6 (ref 5.0–8.0)

## 2017-12-28 LAB — POCT GLYCOSYLATED HEMOGLOBIN (HGB A1C): Hemoglobin A1C: 5.7 % — AB (ref 4.0–5.6)

## 2017-12-28 LAB — POCT URINE PREGNANCY: Preg Test, Ur: NEGATIVE

## 2017-12-28 MED ORDER — GABAPENTIN 100 MG PO CAPS: 100.0000 mg | ORAL_CAPSULE | Freq: Every day | ORAL | 3 refills | Status: DC

## 2017-12-28 NOTE — Progress Notes (Signed)
[facility]  DIABETES TYPE II FOLLOW UP VISIT   PROVIDER: Lanae Boast, FNP   SUBJECTIVE:   27 y.o. female  Stephanie Mack is 27 y.o. female who  has a past medical history of ADHD (attention deficit hyperactivity disorder), Anemia, Anxiety, Bipolar 1 disorder ([place]), Chlamydia (05-31-10), Chronic constipation, Depression, GERD (gastroesophageal reflux disease), Gestational diabetes, Gonorrhea contact, treated, Headache, Infection, Pregnancy induced hypertension, Pseudocyesis (2013), Sickle cell trait ([place]), and Type 2 diabetes mellitus ([place]) (02/13/2017). here for follow-up of Type 2 diabetes mellitus.  Patient denies checking her blood glucose at home. She is not following a carbohydrate modified or ADA diet. She is walking for exercise 2 times per week. Patient states that she has burning on the bottom of her feet. She discontinued the gabapentin on her own several months ago.  Current Outpatient Medications  Medication Sig Dispense Refill  . doxycycline (VIBRAMYCIN) 100 MG capsule Take 1 capsule (100 mg total) by mouth 2 (two) times daily. 28 capsule 0  . Lurasidone HCl (LATUDA PO) Take by mouth.    . metFORMIN (GLUCOPHAGE) 500 MG tablet Take 500 mg by mouth 2 (two) times daily with a meal.    . metroNIDAZOLE (FLAGYL) 500 MG tablet Take 1 tablet (500 mg total) by mouth 2 (two) times daily. 28 tablet 0  . ondansetron (ZOFRAN) 4 MG tablet Take 1 tablet (4 mg total) by mouth every 6 (six) hours. (Patient not taking: Reported on 12/21/2017) 12 tablet 0   No current facility-administered medications for this visit.    Review of Systems  Constitutional: Negative.   HENT: Negative.   Eyes: Negative.   Respiratory: Negative.   Cardiovascular: Negative.   Gastrointestinal: Negative.   Genitourinary: Negative.   Musculoskeletal: Negative.   Skin: Negative.   Neurological: Positive for tingling (bilateral feet).  Psychiatric/Behavioral:  Negative.       OBJECTIVE:  BP 126/72 (BP Location: Left Arm, Patient Position: Sitting, Cuff Size: Small)   Pulse 70   Temp 98.4 F (36.9 C) (Oral)   Wt 136 lb (61.7 kg)   LMP 12/26/2017 Comment: Aug 2019  SpO2 96%   BMI 24.87 kg/m   Lab Results  Component Value Date   HGBA1C 5.7 (A) 12/28/2017    Lab Results  Component Value Date   MICROALBUR 32.1 08/10/2016    Lab Results  Component Value Date   CHOL 194 08/27/2016   HDL 47 08/27/2016   LDLCALC 105 (H) 08/27/2016   TRIG 211 (H) 08/27/2016   CHOLHDL 4.1 08/27/2016     Physical Exam  Constitutional: She is oriented to person, place, and time and well-developed, well-nourished, and in no distress. No distress.  HENT:  Head: Normocephalic and atraumatic.  Eyes: Pupils are equal, round, and reactive to light. Conjunctivae and EOM are normal.  Neck: Normal range of motion. Neck supple.  Cardiovascular: Normal rate, regular rhythm and intact distal pulses. Exam reveals no gallop and no friction rub.  No murmur heard. Pulmonary/Chest: Effort normal and breath sounds normal. No respiratory distress. She has no wheezes.  Abdominal: Soft. Bowel sounds are normal. There is no tenderness.  Musculoskeletal: Normal range of motion. She exhibits no edema or tenderness.  No abnormalities noted to the plantar surfaces of bilateral feet. She expresses pain with palpation.   Lymphadenopathy:    She has no cervical adenopathy.  Neurological: She is alert and oriented to person, place, and time. Gait normal.  Skin: Skin is warm and  dry.  Psychiatric: Mood, memory, affect and judgment normal.  Nursing note and vitals reviewed.    Diabetes Mellitus: well controlled  ASSESSMENT/PLAN: 1. Type 2 diabetes mellitus without complication, without long-term current use of 1. Type 2 diabetes mellitus without complication, without long-term current use of insulin ([place]) Pending labs. Will adjust medications accordingly.   - POCT  glycosylated hemoglobin (Hb A1C)- at goal.  - POCT urinalysis dipstick - Microalbumin/Creatinine Ratio, Urine - Comprehensive metabolic panel Will start on lisinopril 2.5mg  po qd for renal protection. Patient with proteinuria and increased albumin/creatinine ratio which has improved in the past year.  2. Nausea Negative. Discussed eating with metformin.  - POCT urine pregnancy  3. Abnormal urinalysis - Urine Culture  4. Neuropathy - gabapentin (NEURONTIN) 100 MG capsule; Take 1 capsule (100 mg total) by mouth at bedtime.  Dispense: 90 capsule; Refill: 3   Issues reviewed with [name]   Recommendations: 1.  Patient is counseled on appropriate foot care. 2.  BP goal < 130/80. 3.  LDL goal of < 100, HDL > 40 and TG < 150. 4.  Eye Exam yearly and Dental Exam every 6 months. 5.  Dietary recommendations:  Carb modified/ Mediterranean Diet  6.  Physical Activity recommendations:  150 Minutes of aerobic activity weekly.  7.  Medication recommendations at this time are noted above. 8.  Return to clinic in 3-4 months   The patient was given clear instructions to go to ER or return to medical center if symptoms do not improve, worsen or new problems develop. Return to the clinic for scheduled follow up visits. Take medications as prescribed. The patient verbalized understanding and agreed with plan of care.    Ms. Doug Sou. Nathaneil Canary, FNP-BC [facility] [address] [place], [place] [address] [phone]

## 2017-12-28 NOTE — Patient Instructions (Signed)
I restarted your gabapentin. You can start taking one capsule at night x 1 week. If you do not feel better, you can increase it to 200mg  at night x 1 week.    Gabapentin capsules or tablets What is this medicine? GABAPENTIN (GA ba pen tin) is used to control partial seizures in adults with epilepsy. It is also used to treat certain types of nerve pain. This medicine may be used for other purposes; ask your health care provider or pharmacist if you have questions. COMMON BRAND NAME(S): Active-PAC with Gabapentin, Gabarone, Neurontin What should I tell my health care provider before I take this medicine? They need to know if you have any of these conditions: -kidney disease -suicidal thoughts, plans, or attempt; a previous suicide attempt by you or a family member -an unusual or allergic reaction to gabapentin, other medicines, foods, dyes, or preservatives -pregnant or trying to get pregnant -breast-feeding How should I use this medicine? Take this medicine by mouth with a glass of water. Follow the directions on the prescription label. You can take it with or without food. If it upsets your stomach, take it with food.Take your medicine at regular intervals. Do not take it more often than directed. Do not stop taking except on your doctor's advice. If you are directed to break the 600 or 800 mg tablets in half as part of your dose, the extra half tablet should be used for the next dose. If you have not used the extra half tablet within 28 days, it should be thrown away. A special MedGuide will be given to you by the pharmacist with each prescription and refill. Be sure to read this information carefully each time. Talk to your pediatrician regarding the use of this medicine in children. Special care may be needed. Overdosage: If you think you have taken too much of this medicine contact a poison control center or emergency room at once. NOTE: This medicine is only for you. Do not share this  medicine with others. What if I miss a dose? If you miss a dose, take it as soon as you can. If it is almost time for your next dose, take only that dose. Do not take double or extra doses. What may interact with this medicine? Do not take this medicine with any of the following medications: -other gabapentin products This medicine may also interact with the following medications: -alcohol -antacids -antihistamines for allergy, cough and cold -certain medicines for anxiety or sleep -certain medicines for depression or psychotic disturbances -homatropine; hydrocodone -naproxen -narcotic medicines (opiates) for pain -phenothiazines like chlorpromazine, mesoridazine, prochlorperazine, thioridazine This list may not describe all possible interactions. Give your health care provider a list of all the medicines, herbs, non-prescription drugs, or dietary supplements you use. Also tell them if you smoke, drink alcohol, or use illegal drugs. Some items may interact with your medicine. What should I watch for while using this medicine? Visit your doctor or health care professional for regular checks on your progress. You may want to keep a record at home of how you feel your condition is responding to treatment. You may want to share this information with your doctor or health care professional at each visit. You should contact your doctor or health care professional if your seizures get worse or if you have any new types of seizures. Do not stop taking this medicine or any of your seizure medicines unless instructed by your doctor or health care professional. Stopping your medicine suddenly can  increase your seizures or their severity. Wear a medical identification bracelet or chain if you are taking this medicine for seizures, and carry a card that lists all your medications. You may get drowsy, dizzy, or have blurred vision. Do not drive, use machinery, or do anything that needs mental alertness until  you know how this medicine affects you. To reduce dizzy or fainting spells, do not sit or stand up quickly, especially if you are an older patient. Alcohol can increase drowsiness and dizziness. Avoid alcoholic drinks. Your mouth may get dry. Chewing sugarless gum or sucking hard candy, and drinking plenty of water will help. The use of this medicine may increase the chance of suicidal thoughts or actions. Pay special attention to how you are responding while on this medicine. Any worsening of mood, or thoughts of suicide or dying should be reported to your health care professional right away. Women who become pregnant while using this medicine may enroll in the [place] Pregnancy Registry by calling [phone]. This registry collects information about the safety of antiepileptic drug use during pregnancy. What side effects may I notice from receiving this medicine? Side effects that you should report to your doctor or health care professional as soon as possible: -allergic reactions like skin rash, itching or hives, swelling of the face, lips, or tongue -worsening of mood, thoughts or actions of suicide or dying Side effects that usually do not require medical attention (report to your doctor or health care professional if they continue or are bothersome): -constipation -difficulty walking or controlling muscle movements -dizziness -nausea -slurred speech -tiredness -tremors -weight gain This list may not describe all possible side effects. Call your doctor for medical advice about side effects. You may report side effects to FDA at 1-800-FDA-1088. Where should I keep my medicine? Keep out of reach of children. This medicine may cause accidental overdose and death if it taken by other adults, children, or pets. Mix any unused medicine with a substance like cat litter or coffee grounds. Then throw the medicine away in a sealed container like a sealed bag or a coffee  can with a lid. Do not use the medicine after the expiration date. Store at room temperature between 15 and 30 degrees C (59 and 86 degrees F). NOTE: This sheet is a summary. It may not cover all possible information. If you have questions about this medicine, talk to your doctor, pharmacist, or health care provider.  2018 Elsevier/Gold Standard (2013-05-17 15:26:50)

## 2017-12-29 LAB — COMPREHENSIVE METABOLIC PANEL
ALT: 11 IU/L (ref 0–32)
AST: 12 IU/L (ref 0–40)
Albumin/Globulin Ratio: 1.7 (ref 1.2–2.2)
Albumin: 4.3 g/dL (ref 3.5–5.5)
Alkaline Phosphatase: 56 IU/L (ref 39–117)
BUN/Creatinine Ratio: 12 (ref 9–23)
BUN: 9 mg/dL (ref 6–20)
Bilirubin Total: 0.7 mg/dL (ref 0.0–1.2)
CO2: 21 mmol/L (ref 20–29)
Calcium: 9.3 mg/dL (ref 8.7–10.2)
Chloride: 106 mmol/L (ref 96–106)
Creatinine, Ser: 0.73 mg/dL (ref 0.57–1.00)
GFR calc Af Amer: 133 mL/min/{1.73_m2} (ref >59)
GFR calc non Af Amer: 116 mL/min/{1.73_m2} (ref >59)
Globulin, Total: 2.6 g/dL (ref 1.5–4.5)
Glucose: 94 mg/dL (ref 65–99)
Potassium: 3.9 mmol/L (ref 3.5–5.2)
Sodium: 142 mmol/L (ref 134–144)
Total Protein: 6.9 g/dL (ref 6.0–8.5)

## 2017-12-29 LAB — MICROALBUMIN / CREATININE URINE RATIO
Creatinine, Urine: 239.9 mg/dL
Microalb/Creat Ratio: 39.7 mg/g creat — ABNORMAL HIGH (ref 0.0–30.0)
Microalbumin, Urine: 95.3 ug/mL

## 2017-12-30 LAB — URINE CULTURE

## 2017-12-31 MED ORDER — LISINOPRIL 2.5 MG PO TABS: 2.5000 mg | ORAL_TABLET | Freq: Every day | ORAL | 2 refills | Status: DC

## 2018-01-01 ENCOUNTER — Telehealth: Payer: Self-pay

## 2018-01-01 NOTE — Telephone Encounter (Signed)
Called, spoke with patient. Advised that we are adding lisinopril 2.5mg  to her daily regiment to protect her kidney function. Advised that she may develop a dry cough and if this does happen to call us and let us know. She verbalized understanding. Thanks!

## 2018-01-01 NOTE — Telephone Encounter (Signed)
-----   Message from Lanae Boast, [place] sent at 12/31/2017 10:18 AM EDT ----- Please inform the patient that I added lisinopril 2.5 mg to her daily regimen.  This is because she is diabetic and we need to protect her kidney function.  Side effects from the medication include a dry cough.  If she develops a dry cough, once the medication is stopped the dry cough will resolve.  Please have her notify us if dry cough and discontinue of the medication occur.

## 2018-01-03 ENCOUNTER — Ambulatory Visit: Payer: Medicaid Other | Admitting: Family Medicine

## 2018-01-04 ENCOUNTER — Ambulatory Visit: Payer: Self-pay | Admitting: Obstetrics

## 2018-01-10 ENCOUNTER — Ambulatory Visit: Payer: Medicaid Other | Admitting: Obstetrics

## 2018-01-17 ENCOUNTER — Ambulatory Visit: Payer: Medicaid Other | Admitting: Obstetrics

## 2018-02-01 ENCOUNTER — Ambulatory Visit: Payer: Medicaid Other | Admitting: Obstetrics

## 2018-02-02 ENCOUNTER — Inpatient Hospital Stay (HOSPITAL_COMMUNITY)
Admission: AD | Admit: 2018-02-02 | Discharge: 2018-02-02 | Disposition: A | Discharge status: Home / Self Care | Payer: Medicaid Other | Source: Ambulatory Visit | Attending: Obstetrics & Gynecology | Admitting: Obstetrics & Gynecology

## 2018-02-02 ENCOUNTER — Encounter (HOSPITAL_COMMUNITY): Payer: Self-pay | Admitting: *Deleted

## 2018-02-02 DIAGNOSIS — E119 Type 2 diabetes mellitus without complications: Secondary | ICD-10-CM | POA: Insufficient documentation

## 2018-02-02 DIAGNOSIS — K219 Gastro-esophageal reflux disease without esophagitis: Secondary | ICD-10-CM | POA: Diagnosis not present

## 2018-02-02 DIAGNOSIS — N760 Acute vaginitis: Secondary | ICD-10-CM | POA: Diagnosis not present

## 2018-02-02 DIAGNOSIS — Z79899 Other long term (current) drug therapy: Secondary | ICD-10-CM | POA: Diagnosis not present

## 2018-02-02 DIAGNOSIS — F909 Attention-deficit hyperactivity disorder, unspecified type: Secondary | ICD-10-CM | POA: Insufficient documentation

## 2018-02-02 DIAGNOSIS — F419 Anxiety disorder, unspecified: Secondary | ICD-10-CM | POA: Diagnosis not present

## 2018-02-02 DIAGNOSIS — D573 Sickle-cell trait: Secondary | ICD-10-CM | POA: Insufficient documentation

## 2018-02-02 DIAGNOSIS — Z3202 Encounter for pregnancy test, result negative: Secondary | ICD-10-CM | POA: Diagnosis not present

## 2018-02-02 DIAGNOSIS — K5909 Other constipation: Secondary | ICD-10-CM | POA: Insufficient documentation

## 2018-02-02 DIAGNOSIS — Z8249 Family history of ischemic heart disease and other diseases of the circulatory system: Secondary | ICD-10-CM | POA: Diagnosis not present

## 2018-02-02 DIAGNOSIS — B9689 Other specified bacterial agents as the cause of diseases classified elsewhere: Secondary | ICD-10-CM | POA: Insufficient documentation

## 2018-02-02 DIAGNOSIS — N898 Other specified noninflammatory disorders of vagina: Secondary | ICD-10-CM | POA: Diagnosis present

## 2018-02-02 DIAGNOSIS — Z7984 Long term (current) use of oral hypoglycemic drugs: Secondary | ICD-10-CM | POA: Insufficient documentation

## 2018-02-02 DIAGNOSIS — F319 Bipolar disorder, unspecified: Secondary | ICD-10-CM | POA: Diagnosis not present

## 2018-02-02 DIAGNOSIS — R110 Nausea: Secondary | ICD-10-CM | POA: Diagnosis not present

## 2018-02-02 LAB — URINALYSIS, ROUTINE W REFLEX MICROSCOPIC
Bacteria, UA: NONE SEEN
Bilirubin Urine: NEGATIVE
Glucose, UA: NEGATIVE mg/dL
Hgb urine dipstick: NEGATIVE
KETONES UR: NEGATIVE mg/dL
Nitrite: NEGATIVE
Protein, ur: NEGATIVE mg/dL
Specific Gravity, Urine: 1.024 (ref 1.005–1.030)
pH: 6 (ref 5.0–8.0)

## 2018-02-02 LAB — WET PREP, GENITAL
Sperm: NONE SEEN
TRICH WET PREP: NONE SEEN
YEAST WET PREP: NONE SEEN

## 2018-02-02 LAB — POCT PREGNANCY, URINE: Preg Test, Ur: NEGATIVE

## 2018-02-02 MED ORDER — ONDANSETRON 8 MG PO TBDP
8.0000 mg | ORAL_TABLET | Freq: Once | ORAL | Status: AC
  Administered 2018-02-02: 8 mg via ORAL
  Filled 2018-02-02: qty 1

## 2018-02-02 MED ORDER — METRONIDAZOLE 500 MG PO TABS: 500.0000 mg | ORAL_TABLET | Freq: Two times a day (BID) | ORAL | 0 refills | Status: DC

## 2018-02-02 NOTE — MAU Provider Note (Signed)
History     CSN: [id number]  Arrival date and time: 02/02/18 6440   First Provider Initiated Contact with Patient 02/02/18 2036      Chief Complaint  Patient presents with  . Possible Pregnancy  . Vaginal Discharge   HPI Stephanie Mack is a 27 y.o. [id number] non pregnant female who presents with nausea and vaginal irritation. She states she has been nauseous since last night and is concerned she might be pregnant. She also reports a grey discharge with an odor. LMP sometime last month but states it was irregular.   OB History    Gravida  3   Para  2   Term  0   Preterm  2   AB  1   Living  1     SAB  1   TAB  0   Ectopic  0   Multiple  0   Live Births  2           Past Medical History:  Diagnosis Date  . ADHD (attention deficit hyperactivity disorder)   . Anemia   . Anxiety   . Bipolar 1 disorder ([place])   . Chlamydia 05-31-10  . Chronic constipation   . Depression    depression  . GERD (gastroesophageal reflux disease)    with pregnancy  . Gestational diabetes    current pregnancy  . Gonorrhea contact, treated   . Headache   . Infection    UTI  . Pregnancy induced hypertension    previous pregnancy  . Pseudocyesis 2013   Seen in MAU for percieved FM, abd distension. Normal exam.   . Sickle cell trait ([place])   . Type 2 diabetes mellitus ([place]) 02/13/2017    Past Surgical History:  Procedure Laterality Date  . CESAREAN SECTION N/A 09/01/2012   Procedure:  Primary cesarean section with delivery of baby girl at 47.  Apgars 1/1.  ;  Surgeon: Frederico Hamman, MD;  Location: [facility];  Service: Obstetrics;  Laterality: N/A;  . CESAREAN SECTION N/A 01/07/2015   Procedure: REPEAT CESAREAN SECTION;  Surgeon: Shelly Bombard, MD;  Location: [facility];  Service: Obstetrics;  Laterality: N/A;  . NASAL SEPTUM SURGERY    . WISDOM TOOTH EXTRACTION      Family History  Problem Relation Age of Onset  . Kidney disease Mother   . Hypertension Father   .  Cancer Sister   . Asthma Brother   . Diabetes Maternal Grandfather   . Heart disease Neg Hx     Social History   Tobacco Use  . Smoking status: Never Smoker  . Smokeless tobacco: Never Used  Substance Use Topics  . Alcohol use: No    Alcohol/week: 0.0 standard drinks  . Drug use: No    Allergies: No Known Allergies  Medications Prior to Admission  Medication Sig Dispense Refill Last Dose  . gabapentin (NEURONTIN) 100 MG capsule Take 1 capsule (100 mg total) by mouth at bedtime. 90 capsule 3 02/01/2018 at Unknown time  . lisinopril (ZESTRIL) 2.5 MG tablet Take 1 tablet (2.5 mg total) by mouth daily. 90 tablet 2 02/01/2018 at Unknown time  . metFORMIN (GLUCOPHAGE) 500 MG tablet Take 500 mg by mouth 2 (two) times daily with a meal.   02/02/2018 at Unknown time  . doxycycline (VIBRAMYCIN) 100 MG capsule Take 1 capsule (100 mg total) by mouth 2 (two) times daily. 28 capsule 0 Unknown at Unknown time  . Lurasidone HCl (LATUDA PO) Take by  mouth.   More than a month at Unknown time  . metroNIDAZOLE (FLAGYL) 500 MG tablet Take 1 tablet (500 mg total) by mouth 2 (two) times daily. 28 tablet 0 More than a month at Unknown time  . ondansetron (ZOFRAN) 4 MG tablet Take 1 tablet (4 mg total) by mouth every 6 (six) hours. (Patient not taking: Reported on 12/21/2017) 12 tablet 0 Not Taking    Review of Systems  Constitutional: Negative.  Negative for fatigue and fever.  HENT: Negative.   Respiratory: Negative.  Negative for shortness of breath.   Cardiovascular: Negative.  Negative for chest pain.  Gastrointestinal: Negative.  Negative for abdominal pain, constipation, diarrhea, nausea and vomiting.  Genitourinary: Positive for vaginal discharge. Negative for dysuria and vaginal bleeding.  Neurological: Negative.  Negative for dizziness and headaches.   Physical Exam   Blood pressure 135/85, pulse 96, temperature 98.3 F (36.8 C), resp. rate 18, height 5\' 2"  (1.575 m), weight 62.1  kg.  Physical Exam  Nursing note and vitals reviewed. Constitutional: She is oriented to person, place, and time. She appears well-developed and well-nourished. No distress.  HENT:  Head: Normocephalic.  Eyes: Pupils are equal, round, and reactive to light.  Cardiovascular: Normal rate, regular rhythm and normal heart sounds.  Respiratory: Effort normal and breath sounds normal. No respiratory distress.  GI: Soft. Bowel sounds are normal. She exhibits no distension. There is no tenderness.  Genitourinary: Vaginal discharge (Small amount of discharge noted) found.  Neurological: She is alert and oriented to person, place, and time.  Skin: Skin is warm and dry.  Psychiatric: She has a normal mood and affect. Her behavior is normal. Judgment and thought content normal.    MAU Course  Procedures Results for orders placed or performed during the hospital encounter of 02/02/18 (from the past 24 hour(s))  Urinalysis, Routine w reflex microscopic     Status: Abnormal   Collection Time: 02/02/18  8:00 PM  Result Value Ref Range   Color, Urine YELLOW YELLOW   APPearance CLEAR CLEAR   Specific Gravity, Urine 1.024 1.005 - 1.030   pH 6.0 5.0 - 8.0   Glucose, UA NEGATIVE NEGATIVE mg/dL   Hgb urine dipstick NEGATIVE NEGATIVE   Bilirubin Urine NEGATIVE NEGATIVE   Ketones, ur NEGATIVE NEGATIVE mg/dL   Protein, ur NEGATIVE NEGATIVE mg/dL   Nitrite NEGATIVE NEGATIVE   Leukocytes, UA TRACE (A) NEGATIVE   RBC / HPF 0-5 0 - 5 RBC/hpf   WBC, UA 0-5 0 - 5 WBC/hpf   Bacteria, UA NONE SEEN NONE SEEN   Squamous Epithelial / LPF 0-5 0 - 5   Mucus PRESENT   Pregnancy, urine POC     Status: None   Collection Time: 02/02/18  8:01 PM  Result Value Ref Range   Preg Test, Ur NEGATIVE NEGATIVE  Wet prep, genital     Status: Abnormal   Collection Time: 02/02/18  8:08 PM  Result Value Ref Range   Yeast Wet Prep HPF POC NONE SEEN NONE SEEN   Trich, Wet Prep NONE SEEN NONE SEEN   Clue Cells Wet Prep HPF  POC PRESENT (A) NONE SEEN   WBC, Wet Prep HPF POC MODERATE (A) NONE SEEN   Sperm NONE SEEN    MDM UA, UPT Wet prep and gc/chlamydia Zofran 8mg  ODT  Assessment and Plan   1. Bacterial vaginosis   2. Nausea   3. Pregnancy examination or test, negative result    -Discharge home in stable condition -  Rx for metronidazole given to patient -Patient advised to follow-up with gyn of choice for routine gyn care -Patient may return to MAU as needed or if her condition were to change or worsen  Wende Mott CNM 02/02/2018, 8:36 PM

## 2018-02-02 NOTE — MAU Note (Addendum)
Had bleeding for one day on 01/27/18. Feeling light headed and nauseated. Breast tender. Vagina is irritated and itchy. Want to be checked for pregnancy and yeast or whatever is making me itch. Urinating a lot but no pain. Has not done upt

## 2018-02-02 NOTE — Discharge Instructions (Signed)
Bacterial Vaginosis Bacterial vaginosis is an infection of the vagina. It happens when too many germs (bacteria) grow in the vagina. This infection puts you at risk for infections from sex (STIs). Treating this infection can lower your risk for some STIs. You should also treat this if you are pregnant. It can cause your baby to be born early. Follow these instructions at home: Medicines  Take over-the-counter and prescription medicines only as told by your doctor.  Take or use your antibiotic medicine as told by your doctor. Do not stop taking or using it even if you start to feel better. General instructions  If you your sexual partner is a woman, tell her that you have this infection. She needs to get treatment if she has symptoms. If you have a female partner, he does not need to be treated.  During treatment: ? Avoid sex. ? Do not douche. ? Avoid alcohol as told. ? Avoid breastfeeding as told.  Drink enough fluid to keep your pee (urine) clear or pale yellow.  Keep your vagina and butt (rectum) clean. ? Wash the area with warm water every day. ? Wipe from front to back after you use the toilet.  Keep all follow-up visits as told by your doctor. This is important. Preventing this condition  Do not douche.  Use only warm water to wash around your vagina.  Use protection when you have sex. This includes: ? Latex condoms. ? Dental dams.  Limit how many people you have sex with. It is best to only have sex with the same person (be monogamous).  Get tested for STIs. Have your partner get tested.  Wear underwear that is cotton or lined with cotton.  Avoid tight pants and pantyhose. This is most important in summer.  Do not use any products that have nicotine or tobacco in them. These include cigarettes and e-cigarettes. If you need help quitting, ask your doctor.  Do not use illegal drugs.  Limit how much alcohol you drink. Contact a doctor if:  Your symptoms do not get  better, even after you are treated.  You have more discharge or pain when you pee (urinate).  You have a fever.  You have pain in your belly (abdomen).  You have pain with sex.  Your bleed from your vagina between periods. Summary  This infection happens when too many germs (bacteria) grow in the vagina.  Treating this condition can lower your risk for some infections from sex (STIs).  You should also treat this if you are pregnant. It can cause early (premature) birth.  Do not stop taking or using your antibiotic medicine even if you start to feel better. This information is not intended to replace advice given to you by your health care provider. Make sure you discuss any questions you have with your health care provider. Document Released: 12/29/2007 Document Revised: 12/05/2015 Document Reviewed: 12/05/2015 Elsevier Interactive Patient Education  2017 [employer].  In late 2019, the [facility] will be moving to the [facility]. At that time, the MAU (Maternity Admissions Unit), where you are being seen today, will no longer take care of non-pregnant patients. We strongly encourage you to find a doctor's office before that time, so that you can be seen with any GYN concerns, like vaginal discharge, urinary tract infection, etc.. in a timely manner.  In order to make an office visit more convenient, the [facility] [place] at [facility] will be offering evening hours with same-day  appointments, walk-in appointments and scheduled appointments available during this time.  [facility] Hours: Monday - 8am - 7:30 pm with walk-in between 4pm- 7:30 pm Tuesday - 8 am - 5 pm (starting 07/04/17 we will be open late and accepting walk-ins from 4pm - 7:30pm) Wednesday - 8 am - 5 pm (starting 10/04/17 we will be open late and accepting walk-ins from 4pm - 7:30pm) Thursday 8 am - 5 pm (starting 01/04/18 we will be open late and  accepting walk-ins from 4pm - 7:30pm) Friday 8 am - 5 pm  For an appointment please call the [facility] [place] @ [facility] at [phone]  For urgent needs, [facility] is also available for management of urgent GYN complaints such as vaginal discharge or urinary tract infections.

## 2018-02-05 LAB — GC/CHLAMYDIA PROBE AMP (~~LOC~~) NOT AT ARMC
Chlamydia: NEGATIVE
Neisseria Gonorrhea: NEGATIVE

## 2018-02-09 ENCOUNTER — Ambulatory Visit: Payer: Medicaid Other | Admitting: Obstetrics

## 2018-02-09 ENCOUNTER — Encounter: Payer: Self-pay | Admitting: Obstetrics

## 2018-02-09 VITALS — BP 128/86 | HR 94 | Wt 137.0 lb

## 2018-02-09 DIAGNOSIS — N644 Mastodynia: Secondary | ICD-10-CM | POA: Diagnosis not present

## 2018-02-09 DIAGNOSIS — R110 Nausea: Secondary | ICD-10-CM

## 2018-02-09 DIAGNOSIS — Z3202 Encounter for pregnancy test, result negative: Secondary | ICD-10-CM | POA: Diagnosis not present

## 2018-02-09 DIAGNOSIS — N946 Dysmenorrhea, unspecified: Secondary | ICD-10-CM | POA: Diagnosis not present

## 2018-02-09 LAB — POCT URINE PREGNANCY: Preg Test, Ur: NEGATIVE

## 2018-02-09 MED ORDER — IBUPROFEN 800 MG PO TABS: 800.0000 mg | ORAL_TABLET | Freq: Three times a day (TID) | ORAL | 5 refills | Status: DC | PRN

## 2018-02-09 NOTE — Progress Notes (Signed)
Pt was recently seen in MAU and diagnosed with BV. UPT done then (02/02/18) and was negative. Pt was treated with flagyl, pt states discharge and odor have cleared up. Pt is here today with c/o nausea and sore breasts, still thinks she may be pregnant. Pts LMP was 2 weeks ago but only lasted for one day. No contraception and pt reports she is currently SA.

## 2018-02-09 NOTE — Progress Notes (Signed)
Patient ID: Stephanie Mack, female   DOB: [birth year], 27 y.o.   MRN: [id number]  Chief Complaint  Patient presents with  . Nausea    HPI Stephanie Mack is a 27 y.o. female.  Patient complains of nausea and breast tenderness.  She thinks that she may be pregnant.  LMP was 2 weeks ago. HPI  Past Medical History:  Diagnosis Date  . ADHD (attention deficit hyperactivity disorder)   . Anemia   . Anxiety   . Bipolar 1 disorder ([place])   . Chlamydia 05-31-10  . Chronic constipation   . Depression    depression  . GERD (gastroesophageal reflux disease)    with pregnancy  . Gestational diabetes    current pregnancy  . Gonorrhea contact, treated   . Headache   . Infection    UTI  . Pregnancy induced hypertension    previous pregnancy  . Pseudocyesis 2013   Seen in [facility]U for percieved FM, abd distension. Normal exam.   . Sickle cell trait ([place])   . Type 2 diabetes mellitus ([place]) 02/13/2017    Past Surgical History:  Procedure Laterality Date  . CESAREAN SECTION N/A 09/01/2012   Procedure:  Primary cesarean section with delivery of baby girl at 41.  Apgars 1/1.  ;  Surgeon: Frederico Hamman, MD;  Location: [facility];  Service: Obstetrics;  Laterality: N/A;  . CESAREAN SECTION N/A 01/07/2015   Procedure: REPEAT CESAREAN SECTION;  Surgeon: Shelly Bombard, MD;  Location: [facility];  Service: Obstetrics;  Laterality: N/A;  . NASAL SEPTUM SURGERY    . WISDOM TOOTH EXTRACTION      Family History  Problem Relation Age of Onset  . Kidney disease Mother   . Hypertension Father   . Cancer Sister   . Asthma Brother   . Diabetes Maternal Grandfather   . Heart disease Neg Hx     Social History Social History   Tobacco Use  . Smoking status: Never Smoker  . Smokeless tobacco: Never Used  Substance Use Topics  . Alcohol use: No    Alcohol/week: 0.0 standard drinks  . Drug use: No    No Known Allergies  Current Outpatient Medications  Medication Sig Dispense Refill  . gabapentin  (NEURONTIN) 100 MG capsule Take 1 capsule (100 mg total) by mouth at bedtime. 90 capsule 3  . lisinopril (ZESTRIL) 2.5 MG tablet Take 1 tablet (2.5 mg total) by mouth daily. 90 tablet 2  . Lurasidone HCl (LATUDA PO) Take by mouth.    . metroNIDAZOLE (FLAGYL) 500 MG tablet Take 1 tablet (500 mg total) by mouth 2 (two) times daily. 14 tablet 0  . ibuprofen (ADVIL,MOTRIN) 800 MG tablet Take 1 tablet (800 mg total) by mouth every 8 (eight) hours as needed. 30 tablet 5  . metFORMIN (GLUCOPHAGE) 500 MG tablet Take 500 mg by mouth 2 (two) times daily with a meal.     No current facility-administered medications for this visit.     Review of Systems Review of Systems Constitutional: negative for fatigue and weight loss Respiratory: negative for cough and wheezing Cardiovascular: negative for chest pain, fatigue and palpitations Gastrointestinal: negative for abdominal pain and change in bowel habits.  Positive for nausea. Genitourinary:negative Integument/breast: positive for breast tenderness Musculoskeletal:negative for myalgias Neurological: negative for gait problems and tremors Behavioral/Psych: negative for abusive relationship, depression Endocrine: negative for temperature intolerance      Blood pressure 128/86, pulse 94, weight 137 lb (62.1 kg), last menstrual period 01/26/2018.  Physical Exam Physical Exam General:   alert  Skin:   no rash or abnormalities  Lungs:   clear to auscultation bilaterally  Heart:   regular rate and rhythm, S1, S2 normal, no murmur, click, rub or gallop  Breasts:   normal without suspicious masses, skin or nipple changes or axillary nodes  Abdomen:  normal findings: no organomegaly, soft, non-tender and no hernia   50% of 15 min visit spent on counseling and coordination of care.   Data Reviewed UPT:  NEGATIVE  Assessment     1. Nausea  2. Breast tenderness in female  3. Dysmenorrhea Rx: - ibuprofen (ADVIL,MOTRIN) 800 MG tablet; Take 1  tablet (800 mg total) by mouth every 8 (eight) hours as needed.  Dispense: 30 tablet; Refill: 5  4. Pregnancy test negative     Plan    Follow up prn  Orders Placed This Encounter  Procedures  . POCT urine pregnancy   Meds ordered this encounter  Medications  . ibuprofen (ADVIL,MOTRIN) 800 MG tablet    Sig: Take 1 tablet (800 mg total) by mouth every 8 (eight) hours as needed.    Dispense:  30 tablet    Refill:  5     Shelly Bombard MD 02-09-2018

## 2018-02-20 ENCOUNTER — Ambulatory Visit: Payer: Self-pay | Admitting: Obstetrics

## 2018-02-27 ENCOUNTER — Ambulatory Visit: Payer: Medicaid Other | Admitting: Obstetrics

## 2018-02-28 ENCOUNTER — Ambulatory Visit: Payer: Medicaid Other | Admitting: Obstetrics

## 2018-02-28 IMAGING — US US TRANSVAGINAL NON-OB
1 series · 15 of 25 positions shown · non-contrast
Comparison: Pelvic ultrasound February 18, 2014

CLINICAL DATA: Pelvic pain, and chronic intermittent bleeding.
Patient is on Depo shots for birth control.

EXAM:
TRANSABDOMINAL AND TRANSVAGINAL ULTRASOUND OF PELVIS
TECHNIQUE: Both transabdominal and transvaginal ultrasound examinations of the
pelvis were performed. Transabdominal technique was performed for
global imaging of the pelvis including uterus, ovaries, adnexal
regions, and pelvic cul-de-sac. It was necessary to proceed with
endovaginal exam following the transabdominal exam to visualize the
uterus, endometrium, ovaries, and adnexal regions..

[Series 1: us transvaginal non-ob · 15 of 41 slices shown]
[im 1/41]
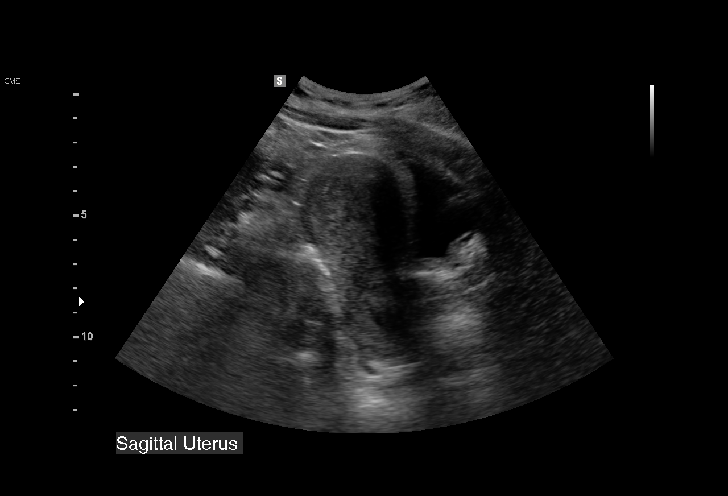
[im 4/41]
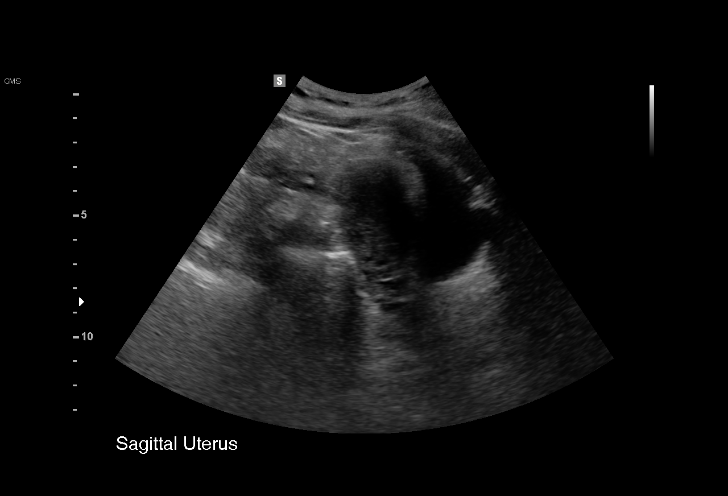
[im 7/41]
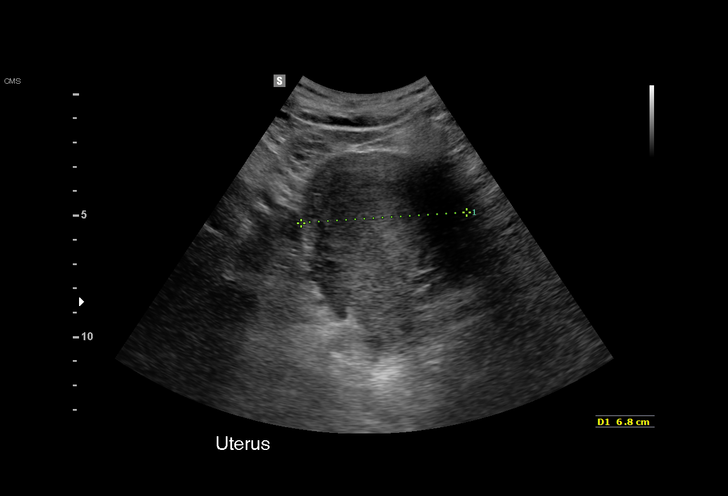
[im 9/41]
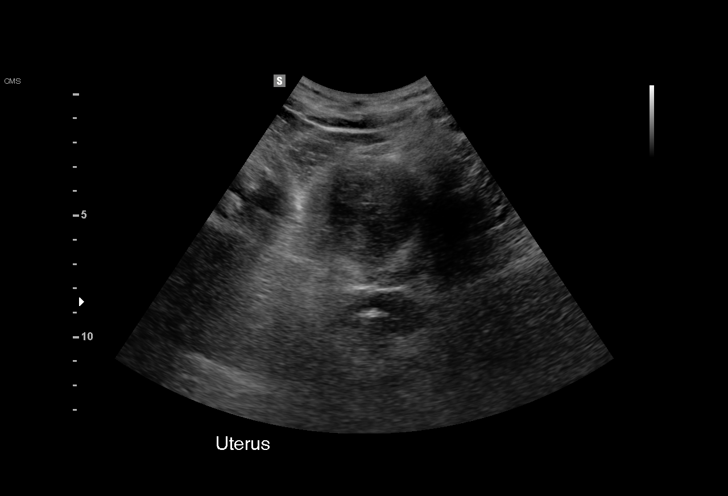
[im 12/41]
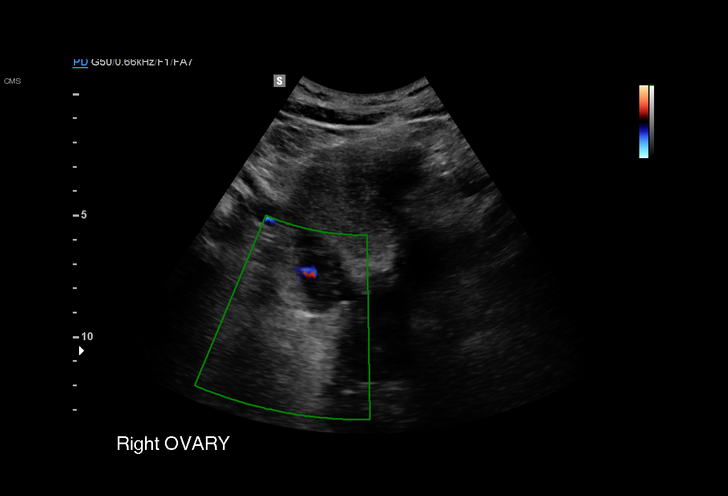
[im 16/41]
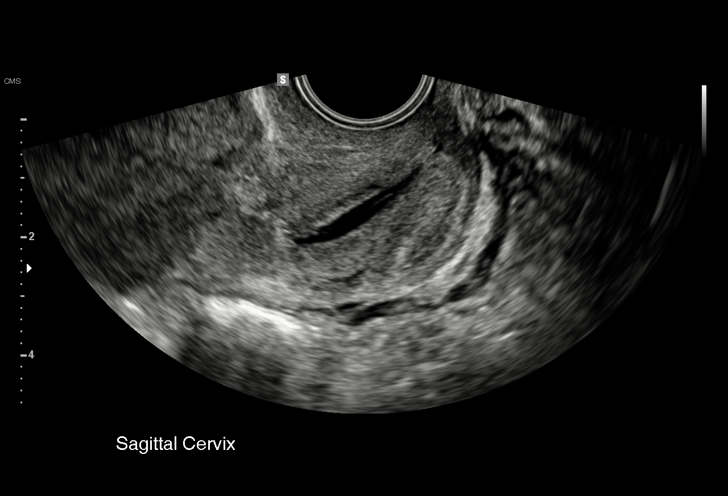
[im 17/41]
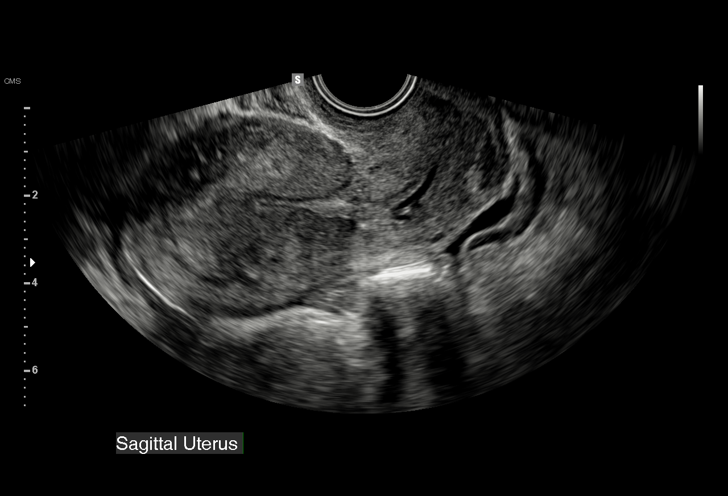
[im 21/41]
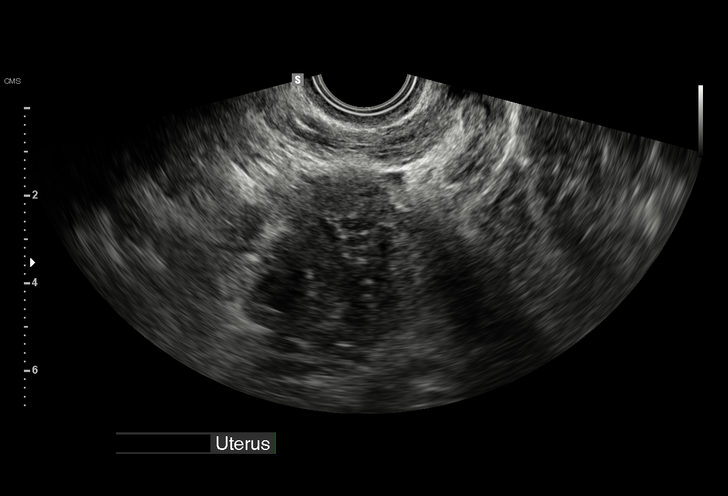
[im 24/41]
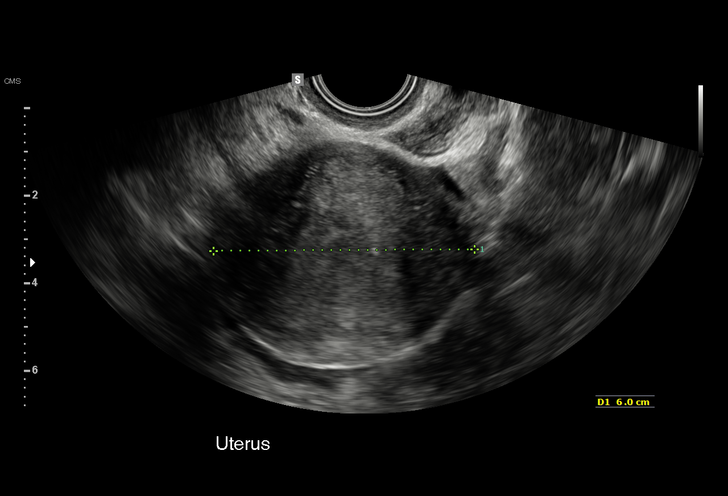
[im 26/41]
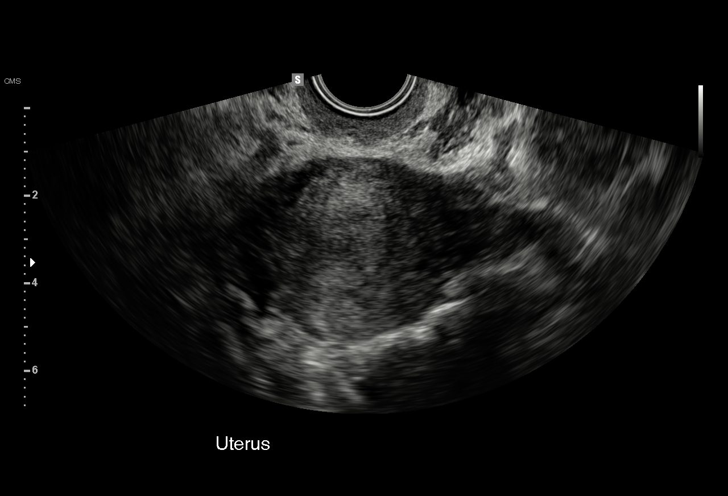
[im 29/41]
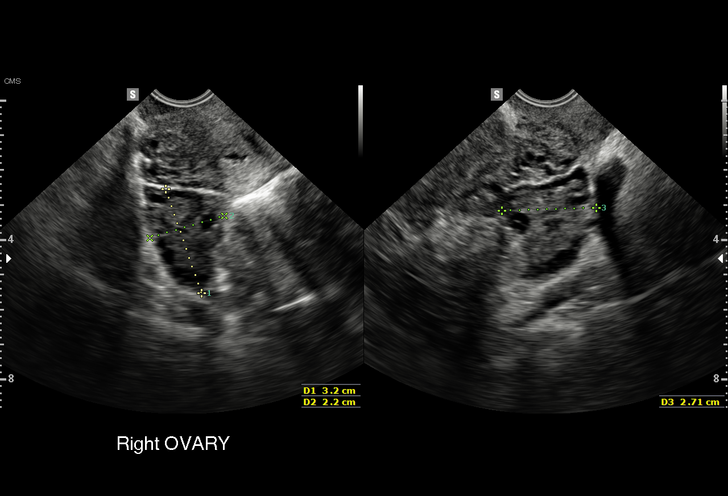
[im 32/41]
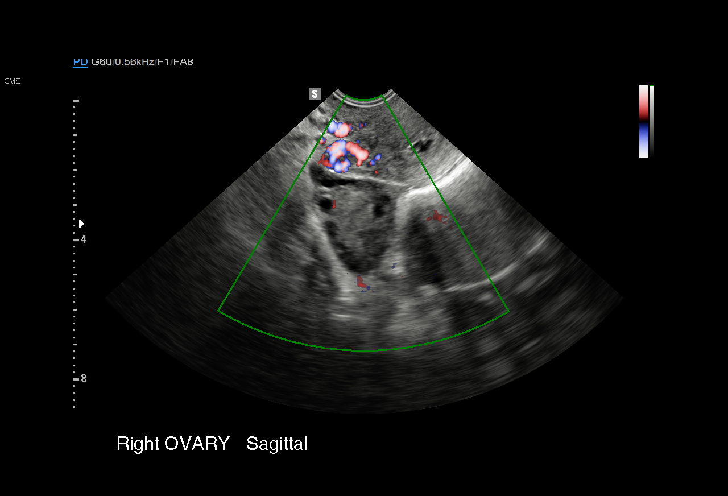
[im 34/41]
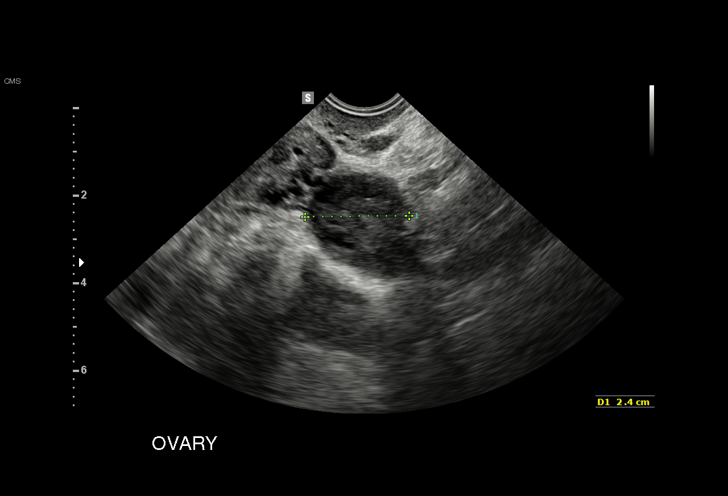
[im 37/41]
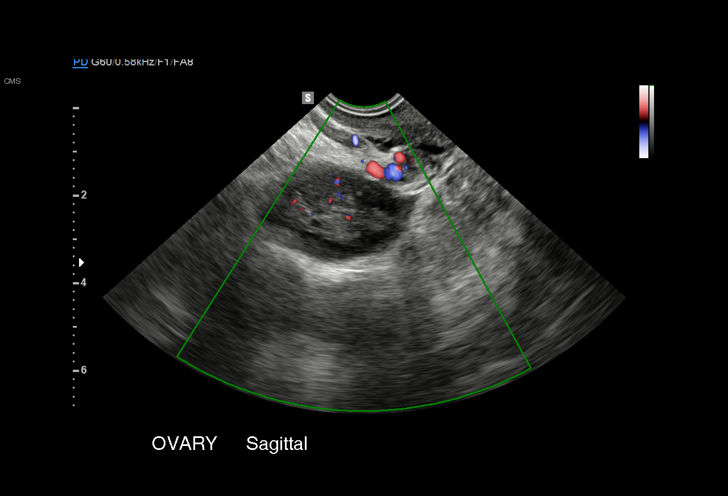
[im 41/41]
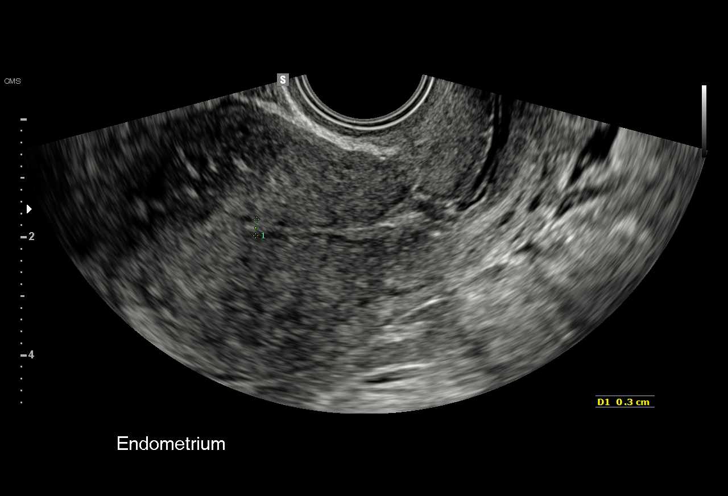

[15 of 25 positions shown; findings below may reference images not displayed]

FINDINGS: Uterus

Measurements: 9.3 x 4.6 x 6.0 cm. The myometrial echotexture is
heterogeneous. There is no discrete fibroid..

Endometrium

Thickness: 3 mm.  No focal abnormality visualized.

Right ovary

Measurements: 3.2 x 2.7 x 2.2 cm. Normal appearance/no adnexal mass.

Left ovary

Measurements: 3.5 x 2.1 x 2.4 cm. Normal appearance/no adnexal mass.

Other findings

There is a trace of free pelvic fluid.
IMPRESSION: 1. Heterogeneous myometrium without discrete fibroids. Normal
endometrium. If bleeding remains unresponsive to hormonal or medical
therapy, sonohysterogram should be considered for focal lesion
work-up. (Ref: Radiological Reasoning: Algorithmic Workup of
Abnormal Vaginal Bleeding with Endovaginal Sonography and
Sonohysterography. [id number]; 191:S68-73)
2. Normal appearance of the ovaries.  No free pelvic fluid.

## 2018-03-08 ENCOUNTER — Other Ambulatory Visit (HOSPITAL_COMMUNITY)
Admission: RE | Admit: 2018-03-08 | Discharge: 2018-03-08 | Disposition: A | Discharge status: Home / Self Care | Payer: Medicaid Other | Source: Ambulatory Visit | Attending: Obstetrics | Admitting: Obstetrics

## 2018-03-08 ENCOUNTER — Encounter: Payer: Self-pay | Admitting: Obstetrics

## 2018-03-08 ENCOUNTER — Ambulatory Visit (INDEPENDENT_AMBULATORY_CARE_PROVIDER_SITE_OTHER): Payer: Medicaid Other | Admitting: Obstetrics

## 2018-03-08 VITALS — BP 126/79 | HR 94 | Wt 134.0 lb

## 2018-03-08 DIAGNOSIS — K581 Irritable bowel syndrome with constipation: Secondary | ICD-10-CM

## 2018-03-08 DIAGNOSIS — N898 Other specified noninflammatory disorders of vagina: Secondary | ICD-10-CM | POA: Diagnosis not present

## 2018-03-08 DIAGNOSIS — R102 Pelvic and perineal pain: Secondary | ICD-10-CM | POA: Diagnosis not present

## 2018-03-08 DIAGNOSIS — R109 Unspecified abdominal pain: Secondary | ICD-10-CM

## 2018-03-08 DIAGNOSIS — N644 Mastodynia: Secondary | ICD-10-CM | POA: Diagnosis not present

## 2018-03-08 DIAGNOSIS — G8929 Other chronic pain: Secondary | ICD-10-CM

## 2018-03-08 LAB — POCT URINALYSIS DIPSTICK
Bilirubin, UA: NEGATIVE
Glucose, UA: NEGATIVE
KETONES UA: NEGATIVE
NITRITE UA: NEGATIVE
PH UA: 6 (ref 5.0–8.0)
PROTEIN UA: POSITIVE — AB
Spec Grav, UA: >=1.03 — AB (ref 1.010–1.025)
UROBILINOGEN UA: 0.2 U/dL

## 2018-03-08 LAB — POCT URINE PREGNANCY: Preg Test, Ur: NEGATIVE

## 2018-03-08 MED ORDER — DICYCLOMINE HCL 20 MG PO TABS: 20.0000 mg | ORAL_TABLET | Freq: Three times a day (TID) | ORAL | 5 refills | Status: DC

## 2018-03-08 NOTE — Progress Notes (Signed)
Patient ID: Stephanie Mack, female   DOB: 07-26-1990, 27 y.o.   MRN: [id number]  Chief Complaint  Patient presents with  . Breast Problem  . Abdominal Pain    HPI Stephanie Mack is a 27 y.o. female.  Complains of lower abdominal / pelvic pain off and on for past several months.  Denies fever / chills. N/V or dysuria.  She does have constipation and occasional diarrhea.  Also has breast tenderness around time for period. HPI  Past Medical History:  Diagnosis Date  . ADHD (attention deficit hyperactivity disorder)   . Anemia   . Anxiety   . Bipolar 1 disorder ([place])   . Chlamydia 05-31-10  . Chronic constipation   . Depression    depression  . GERD (gastroesophageal reflux disease)    with pregnancy  . Gestational diabetes    current pregnancy  . Gonorrhea contact, treated   . Headache   . Infection    UTI  . Pregnancy induced hypertension    previous pregnancy  . Pseudocyesis 2013   Seen in MAU for percieved FM, abd distension. Normal exam.   . Sickle cell trait ([place])   . Type 2 diabetes mellitus ([place]) 02/13/2017    Past Surgical History:  Procedure Laterality Date  . CESAREAN SECTION N/A 09/01/2012   Procedure:  Primary cesarean section with delivery of baby girl at 84.  Apgars 1/1.  ;  Surgeon: Frederico Hamman, MD;  Location: [facility];  Service: Obstetrics;  Laterality: N/A;  . CESAREAN SECTION N/A 01/07/2015   Procedure: REPEAT CESAREAN SECTION;  Surgeon: Shelly Bombard, MD;  Location: [facility];  Service: Obstetrics;  Laterality: N/A;  . NASAL SEPTUM SURGERY    . WISDOM TOOTH EXTRACTION      Family History  Problem Relation Age of Onset  . Kidney disease Mother   . Hypertension Father   . Cancer Sister   . Asthma Brother   . Diabetes Maternal Grandfather   . Heart disease Neg Hx     Social History Social History   Tobacco Use  . Smoking status: Never Smoker  . Smokeless tobacco: Never Used  Substance Use Topics  . Alcohol use: No    Alcohol/week: 0.0  standard drinks  . Drug use: No    No Known Allergies  Current Outpatient Medications  Medication Sig Dispense Refill  . gabapentin (NEURONTIN) 100 MG capsule Take 1 capsule (100 mg total) by mouth at bedtime. 90 capsule 3  . ibuprofen (ADVIL,MOTRIN) 800 MG tablet Take 1 tablet (800 mg total) by mouth every 8 (eight) hours as needed. 30 tablet 5  . lisinopril (ZESTRIL) 2.5 MG tablet Take 1 tablet (2.5 mg total) by mouth daily. 90 tablet 2  . Lurasidone HCl (LATUDA PO) Take by mouth.    . metFORMIN (GLUCOPHAGE) 500 MG tablet Take 500 mg by mouth 2 (two) times daily with a meal.    . metroNIDAZOLE (FLAGYL) 500 MG tablet Take 1 tablet (500 mg total) by mouth 2 (two) times daily. (Patient not taking: Reported on 03/08/2018) 14 tablet 0   No current facility-administered medications for this visit.     Review of Systems Review of Systems Constitutional: negative for fatigue and weight loss Respiratory: negative for cough and wheezing Cardiovascular: negative for chest pain, fatigue and palpitations Gastrointestinal: negative for abdominal pain and change in bowel habits Genitourinary:negative Integument/breast: negative for nipple discharge Musculoskeletal:negative for myalgias Neurological: negative for gait problems and tremors Behavioral/Psych: negative for abusive relationship, depression  Endocrine: negative for temperature intolerance      Blood pressure 126/79, pulse 94, weight 134 lb (60.8 kg).  Physical Exam Physical Exam General:   alert  Skin:   no rash or abnormalities  Lungs:   clear to auscultation bilaterally  Heart:   regular rate and rhythm, S1, S2 normal, no murmur, click, rub or gallop  Breasts:   normal without suspicious masses, skin or nipple changes or axillary nodes  Abdomen:  normal findings: no organomegaly, soft, non-tender and no hernia  Pelvis:  External genitalia: normal general appearance Urinary system: urethral meatus normal and bladder without  fullness, nontender Vaginal: normal without tenderness, induration or masses Cervix: normal appearance Adnexa: normal bimanual exam Uterus: anteverted and non-tender, normal size    50% of 15 min visit spent on counseling and coordination of care.   Data Reviewed Wet Prep Cultures  Assessment     1. Vaginal discharge Rx: - Cervicovaginal ancillary only( [place])  2. Abdominal pain, unspecified abdominal location Rx: - POCT Urinalysis Dipstick - POCT urine pregnancy - Urine Culture  3. Breast pain - hormonally mediated  4. Chronic pelvic pain in female Rx: - US PELVIC COMPLETE WITH TRANSVAGINAL; Future    Plan    Follow up prn May need referral to GI for IBS  Orders Placed This Encounter  Procedures  . Urine Culture  . US PELVIC COMPLETE WITH TRANSVAGINAL    Standing Status:   Future    Standing Expiration Date:   05/10/2019    Order Specific Question:   Reason for Exam (SYMPTOM  OR DIAGNOSIS REQUIRED)    Answer:   Chronic pelvic pain    Order Specific Question:   Preferred imaging location?    Answer:   [facility]  . POCT Urinalysis Dipstick  . POCT urine pregnancy   No orders of the defined types were placed in this encounter.    Shelly Bombard MD 03-08-2018

## 2018-03-08 NOTE — Progress Notes (Signed)
po[facility]YN presents for probem visit today for breast pain and abdominal pain.  Pt last seen 02/09/18 for Breast tenderness and nausea.  Pt seen 12/21/17 for abdominal pain and 11/20/2017 for abdominal pain.   Pt has had MAU encounters for same sx's.   Pt states she has had breast pain x 2 weeks now. I advised pt Breast U/S have been ordered for her and she has not kept appts pt states she was not aware.  Pt c/o abdominal pain that is sharp and constant  And notes vaginal irritation.

## 2018-03-09 ENCOUNTER — Other Ambulatory Visit: Payer: Self-pay | Admitting: Obstetrics

## 2018-03-09 DIAGNOSIS — B379 Candidiasis, unspecified: Secondary | ICD-10-CM

## 2018-03-09 DIAGNOSIS — A749 Chlamydial infection, unspecified: Secondary | ICD-10-CM

## 2018-03-09 LAB — CERVICOVAGINAL ANCILLARY ONLY
BACTERIAL VAGINITIS: NEGATIVE
CHLAMYDIA, DNA PROBE: POSITIVE — AB
Candida vaginitis: POSITIVE — AB
NEISSERIA GONORRHEA: NEGATIVE
TRICH (WINDOWPATH): NEGATIVE

## 2018-03-09 MED ORDER — AZITHROMYCIN 500 MG PO TABS: 1000.0000 mg | ORAL_TABLET | Freq: Once | ORAL | 0 refills | Status: AC

## 2018-03-09 MED ORDER — FLUCONAZOLE 150 MG PO TABS: 150.0000 mg | ORAL_TABLET | Freq: Once | ORAL | 2 refills | Status: AC

## 2018-03-09 MED ORDER — CEFIXIME 400 MG PO CAPS: 400.0000 mg | ORAL_CAPSULE | Freq: Once | ORAL | 0 refills | Status: AC

## 2018-03-10 LAB — URINE CULTURE

## 2018-03-12 ENCOUNTER — Telehealth: Payer: Self-pay

## 2018-03-12 NOTE — Telephone Encounter (Signed)
Spoke with pt and advised of results, rx, and treatment plan. Faxed form to [facility]

## 2018-03-15 ENCOUNTER — Ambulatory Visit (HOSPITAL_COMMUNITY): Payer: Medicaid Other

## 2018-03-18 ENCOUNTER — Inpatient Hospital Stay (HOSPITAL_COMMUNITY)
Admission: AD | Admit: 2018-03-18 | Discharge: 2018-03-18 | Disposition: A | Discharge status: Home / Self Care | Payer: Medicaid Other | Attending: Obstetrics & Gynecology | Admitting: Obstetrics & Gynecology

## 2018-03-18 ENCOUNTER — Encounter (HOSPITAL_COMMUNITY): Payer: Self-pay | Admitting: *Deleted

## 2018-03-18 ENCOUNTER — Other Ambulatory Visit (HOSPITAL_COMMUNITY): Payer: Self-pay | Admitting: Advanced Practice Midwife

## 2018-03-18 DIAGNOSIS — R112 Nausea with vomiting, unspecified: Secondary | ICD-10-CM | POA: Diagnosis not present

## 2018-03-18 DIAGNOSIS — Z825 Family history of asthma and other chronic lower respiratory diseases: Secondary | ICD-10-CM | POA: Insufficient documentation

## 2018-03-18 DIAGNOSIS — F909 Attention-deficit hyperactivity disorder, unspecified type: Secondary | ICD-10-CM | POA: Insufficient documentation

## 2018-03-18 DIAGNOSIS — Z79899 Other long term (current) drug therapy: Secondary | ICD-10-CM | POA: Diagnosis not present

## 2018-03-18 DIAGNOSIS — Z7984 Long term (current) use of oral hypoglycemic drugs: Secondary | ICD-10-CM | POA: Insufficient documentation

## 2018-03-18 DIAGNOSIS — F319 Bipolar disorder, unspecified: Secondary | ICD-10-CM | POA: Insufficient documentation

## 2018-03-18 DIAGNOSIS — F419 Anxiety disorder, unspecified: Secondary | ICD-10-CM | POA: Diagnosis not present

## 2018-03-18 DIAGNOSIS — E119 Type 2 diabetes mellitus without complications: Secondary | ICD-10-CM

## 2018-03-18 DIAGNOSIS — R110 Nausea: Secondary | ICD-10-CM | POA: Diagnosis not present

## 2018-03-18 DIAGNOSIS — R350 Frequency of micturition: Secondary | ICD-10-CM | POA: Insufficient documentation

## 2018-03-18 DIAGNOSIS — R42 Dizziness and giddiness: Secondary | ICD-10-CM | POA: Diagnosis not present

## 2018-03-18 DIAGNOSIS — Z8249 Family history of ischemic heart disease and other diseases of the circulatory system: Secondary | ICD-10-CM | POA: Diagnosis not present

## 2018-03-18 DIAGNOSIS — D573 Sickle-cell trait: Secondary | ICD-10-CM | POA: Insufficient documentation

## 2018-03-18 DIAGNOSIS — Z841 Family history of disorders of kidney and ureter: Secondary | ICD-10-CM | POA: Diagnosis not present

## 2018-03-18 LAB — URINALYSIS, ROUTINE W REFLEX MICROSCOPIC
Bilirubin Urine: NEGATIVE
GLUCOSE, UA: NEGATIVE mg/dL
Ketones, ur: NEGATIVE mg/dL
LEUKOCYTES UA: NEGATIVE
NITRITE: NEGATIVE
PH: 5.5 (ref 5.0–8.0)
Protein, ur: 30 mg/dL — AB
Specific Gravity, Urine: >1.03 — ABNORMAL HIGH (ref 1.005–1.030)

## 2018-03-18 LAB — CBC WITH DIFFERENTIAL/PLATELET
Basophils Absolute: 0 10*3/uL (ref 0.0–0.1)
Basophils Relative: 0 %
Eosinophils Absolute: 0.1 10*3/uL (ref 0.0–0.5)
Eosinophils Relative: 1 %
HEMATOCRIT: 38.5 % (ref 36.0–46.0)
Hemoglobin: 12.3 g/dL (ref 12.0–15.0)
Lymphocytes Relative: 34 %
Lymphs Abs: 2 10*3/uL (ref 0.7–4.0)
MCH: 25.6 pg — ABNORMAL LOW (ref 26.0–34.0)
MCHC: 31.9 g/dL (ref 30.0–36.0)
MCV: 80.2 fL (ref 80.0–100.0)
Monocytes Absolute: 0.5 10*3/uL (ref 0.1–1.0)
Monocytes Relative: 9 %
Neutro Abs: 3.4 10*3/uL (ref 1.7–7.7)
Neutrophils Relative %: 56 %
Platelets: 206 10*3/uL (ref 150–400)
RBC: 4.8 MIL/uL (ref 3.87–5.11)
RDW: 13.3 % (ref 11.5–15.5)
WBC: 6 10*3/uL (ref 4.0–10.5)

## 2018-03-18 LAB — COMPREHENSIVE METABOLIC PANEL
ALT: 14 U/L (ref 0–44)
AST: 17 U/L (ref 15–41)
Albumin: 4 g/dL (ref 3.5–5.0)
Alkaline Phosphatase: 56 U/L (ref 38–126)
Anion gap: 7 (ref 5–15)
BUN: 9 mg/dL (ref 6–20)
CO2: 23 mmol/L (ref 22–32)
Calcium: 9.1 mg/dL (ref 8.9–10.3)
Chloride: 108 mmol/L (ref 98–111)
Creatinine, Ser: 0.53 mg/dL (ref 0.44–1.00)
GFR calc Af Amer: >60 mL/min (ref >60)
GFR calc non Af Amer: >60 mL/min (ref >60)
GLUCOSE: 122 mg/dL — AB (ref 70–99)
Potassium: 3.2 mmol/L — ABNORMAL LOW (ref 3.5–5.1)
SODIUM: 138 mmol/L (ref 135–145)
Total Bilirubin: 1.1 mg/dL (ref 0.3–1.2)
Total Protein: 7.4 g/dL (ref 6.5–8.1)

## 2018-03-18 LAB — URINALYSIS, MICROSCOPIC (REFLEX): Bacteria, UA: NONE SEEN

## 2018-03-18 LAB — GLUCOSE, CAPILLARY: Glucose-Capillary: 123 mg/dL — ABNORMAL HIGH (ref 70–99)

## 2018-03-18 LAB — POCT PREGNANCY, URINE: Preg Test, Ur: NEGATIVE

## 2018-03-18 MED ORDER — POTASSIUM CHLORIDE CRYS ER 20 MEQ PO TBCR: 20.0000 meq | EXTENDED_RELEASE_TABLET | Freq: Three times a day (TID) | ORAL | 0 refills | Status: DC

## 2018-03-18 MED ORDER — ONDANSETRON HCL 4 MG PO TABS: 4.0000 mg | ORAL_TABLET | Freq: Three times a day (TID) | ORAL | 0 refills | Status: DC | PRN

## 2018-03-18 NOTE — MAU Note (Signed)
Been feeling nauseous and light headed for a couple days. Emesis x1   Feels weak when she stands up  No pain, no vaginal bleeding  Feels like she has been going to the bathroom more often but does not have any burning

## 2018-03-18 NOTE — Discharge Instructions (Signed)
Weakness Weakness is a lack of strength. You may feel weak all over your body (generalized), or you may feel weak in one specific part of your body (focal). There are many potential causes of weakness. Sometimes, the cause of your weakness may not be known. Some causes of weakness can be serious, so it is important to see your doctor. Follow these instructions at home:  Rest as needed.  Try to get enough sleep. Talk to your doctor about how much sleep you need each night.  Take over-the-counter and prescription medicines only as told by your doctor.  Eat a healthy, well-balanced diet. This includes: ? Proteins to build muscles, such as lean meats and fish. ? Fresh fruits and vegetables. ? Carbohydrates to boost energy, such as whole grains.  Drink enough fluid to keep your pee (urine) clear or pale yellow.  Do strength exercises, such as arm curls and leg raises, for 30 minutes at least 2 days a week or as told by your doctor.  Think about working with a physical therapist or trainer to help you get stronger.  Keep all follow-up visits as told by your doctor. This is important. Contact a doctor if:  Your weakness does not get better or it gets worse.  Your weakness affects your ability to: ? Think clearly. ? Do your normal daily activities. Get help right away if:  You have sudden weakness.  You have trouble breathing or shortness of breath.  You have problems with your vision.  You have trouble talking or swallowing.  You have trouble standing or walking.  You have chest pain.  You are light-headed.  You pass out (lose consciousness). This information is not intended to replace advice given to you by your health care provider. Make sure you discuss any questions you have with your health care provider. Document Released: 03/03/2008 Document Revised: 04/16/2015 Document Reviewed: 01/09/2015 Elsevier Interactive Patient Education  2018 [employer]  

## 2018-03-18 NOTE — MAU Provider Note (Signed)
Chief Complaint: Fatigue; Nausea; and Urinary Frequency   First Provider Initiated Contact with Patient 03/18/18 0940      SUBJECTIVE HPI: Stephanie Mack is a 27 y.o. [id number] female who presents to Maternity Admissions reporting: 1. Nausea for a few days.  Vomited once. 2. Light-headedness 3. Frequency of urination.  States she was going to church when Sx started. Thought it might have been related to her Diabetes or taking Metformin this morning. Can't find her blood sugar meter due to moving recently.   Modifying factors: Hasn't tried anything for Sx Associated signs and symptoms: Neg for fever, chills, diarrhea, constipation.    Past Medical History:  Diagnosis Date  . ADHD (attention deficit hyperactivity disorder)   . Anemia   . Anxiety   . Bipolar 1 disorder ([place])   . Chlamydia 05-31-10  . Chronic constipation   . Depression    depression  . GERD (gastroesophageal reflux disease)    with pregnancy  . Gestational diabetes    current pregnancy  . Gonorrhea contact, treated   . Headache   . Infection    UTI  . Pregnancy induced hypertension    previous pregnancy  . Pseudocyesis 2013   Seen in MAU for percieved FM, abd distension. Normal exam.   . Sickle cell trait ([place])   . Type 2 diabetes mellitus ([place]) 02/13/2017   OB History  Gravida Para Term Preterm AB Living  3 2 0 2 1 1   SAB TAB Ectopic Multiple Live Births  1 0 0 0 2    # Outcome Date GA Lbr Len/2nd Weight Sex Delivery Anes PTL Lv  3 Preterm 01/07/15 [redacted]w[redacted]d  2829 g F CS-Vac Spinal  LIV  2 Preterm 09/01/12 [redacted]w[redacted]d   F CS-LTranv Spinal  DEC  1 SAB 09/16/09 [redacted]w[redacted]d            Birth Comments: Twin gestational sacs.    Past Surgical History:  Procedure Laterality Date  . CESAREAN SECTION N/A 09/01/2012   Procedure:  Primary cesarean section with delivery of baby girl at 6.  Apgars 1/1.  ;  Surgeon: Frederico Hamman, MD;  Location: [facility];  Service: Obstetrics;  Laterality: N/A;  . CESAREAN SECTION N/A  01/07/2015   Procedure: REPEAT CESAREAN SECTION;  Surgeon: Shelly Bombard, MD;  Location: [facility];  Service: Obstetrics;  Laterality: N/A;  . NASAL SEPTUM SURGERY    . WISDOM TOOTH EXTRACTION     Social History   Socioeconomic History  . Marital status: Single    Spouse name: Not on file  . Number of children: Not on file  . Years of education: Not on file  . Highest education level: Not on file  Occupational History  . Not on file  Social Needs  . Financial resource strain: Not on file  . Food insecurity:    Worry: Not on file    Inability: Not on file  . Transportation needs:    Medical: Not on file    Non-medical: Not on file  Tobacco Use  . Smoking status: Never Smoker  . Smokeless tobacco: Never Used  Substance and Sexual Activity  . Alcohol use: No    Alcohol/week: 0.0 standard drinks  . Drug use: No  . Sexual activity: Yes    Partners: Male    Birth control/protection: None    Comment: last intercourse in august 2019  Lifestyle  . Physical activity:    Days per week: Not on file    Minutes  per session: Not on file  . Stress: Not on file  Relationships  . Social connections:    Talks on phone: Not on file    Gets together: Not on file    Attends religious service: Not on file    Active member of club or organization: Not on file    Attends meetings of clubs or organizations: Not on file    Relationship status: Not on file  . Intimate partner violence:    Fear of current or ex partner: No    Emotionally abused: No    Physically abused: No    Forced sexual activity: No  Other Topics Concern  . Not on file  Social History Narrative   Single, one daughter born 2016          Family History  Problem Relation Age of Onset  . Kidney disease Mother   . Hypertension Father   . Cancer Sister   . Asthma Brother   . Diabetes Maternal Grandfather   . Heart disease Neg Hx    No current facility-administered medications on file prior to encounter.     Current Outpatient Medications on File Prior to Encounter  Medication Sig Dispense Refill  . gabapentin (NEURONTIN) 100 MG capsule Take 1 capsule (100 mg total) by mouth at bedtime. 90 capsule 3  . ibuprofen (ADVIL,MOTRIN) 800 MG tablet Take 1 tablet (800 mg total) by mouth every 8 (eight) hours as needed. 30 tablet 5  . lisinopril (ZESTRIL) 2.5 MG tablet Take 1 tablet (2.5 mg total) by mouth daily. 90 tablet 2  . metFORMIN (GLUCOPHAGE) 500 MG tablet Take 500 mg by mouth 2 (two) times daily with a meal.    . metroNIDAZOLE (FLAGYL) 500 MG tablet Take 1 tablet (500 mg total) by mouth 2 (two) times daily. 14 tablet 0  . dicyclomine (BENTYL) 20 MG tablet Take 1 tablet (20 mg total) by mouth 3 (three) times daily before meals. 90 tablet 5  . Lurasidone HCl (LATUDA PO) Take by mouth.     No Known Allergies  I have reviewed patient's Past Medical Hx, Surgical Hx, Family Hx, Social Hx, medications and allergies.   Review of Systems  Constitutional: Positive for fatigue. Negative for appetite change, chills, diaphoresis, fever and unexpected weight change.  Gastrointestinal: Positive for nausea and vomiting. Negative for abdominal pain, constipation and diarrhea.  Endocrine: Negative for polydipsia and polyuria.  Genitourinary: Positive for frequency. Negative for dysuria, flank pain, hematuria and menstrual problem.  Musculoskeletal: Negative for myalgias.  Neurological: Positive for weakness. Negative for dizziness and headaches.  Psychiatric/Behavioral: Negative for confusion.    OBJECTIVE Patient Vitals for the past 24 hrs:  BP Temp Temp src Pulse Resp Height Weight  03/18/18 0857 133/79 97.9 F (36.6 C) Oral (!) 111 18 5\' 2"  (1.575 m) 61.2 kg  Orthostatic VS  Lying:    BP: 108/61 HR: 79 Sitting:   BP: 133/81 HR: 93 Standing:  BP: 129/62 HR: 101  Standing 3 minutes: Could not tolerate due to dizziness  Constitutional: Well-developed, well-nourished female in no acute distress.   Skin: No pallor, diaphoresis Cardiovascular: Mild tachycardia initially, resolved.  Respiratory: normal rate and effort.  GI: Deferred Neurologic: Alert and oriented x 4.  GU: Neg CVAT.  SPECULUM EXAM: Not indicated  LAB RESULTS Results for orders placed or performed during the hospital encounter of 03/18/18 (from the past 24 hour(s))  Urinalysis, Routine w reflex microscopic     Status: Abnormal   Collection Time:  03/18/18  9:04 AM  Result Value Ref Range   Color, Urine YELLOW YELLOW   APPearance CLEAR CLEAR   Specific Gravity, Urine >1.030 (H) 1.005 - 1.030   pH 5.5 5.0 - 8.0   Glucose, UA NEGATIVE NEGATIVE mg/dL   Hgb urine dipstick TRACE (A) NEGATIVE   Bilirubin Urine NEGATIVE NEGATIVE   Ketones, ur NEGATIVE NEGATIVE mg/dL   Protein, ur 30 (A) NEGATIVE mg/dL   Nitrite NEGATIVE NEGATIVE   Leukocytes, UA NEGATIVE NEGATIVE  Urinalysis, Microscopic (reflex)     Status: None   Collection Time: 03/18/18  9:04 AM  Result Value Ref Range   RBC / HPF 0-5 0 - 5 RBC/hpf   WBC, UA 0-5 0 - 5 WBC/hpf   Bacteria, UA NONE SEEN NONE SEEN   Squamous Epithelial / LPF 0-5 0 - 5  Pregnancy, urine POC     Status: None   Collection Time: 03/18/18  9:08 AM  Result Value Ref Range   Preg Test, Ur NEGATIVE NEGATIVE  CBC with Differential/Platelet     Status: Abnormal   Collection Time: 03/18/18  9:52 AM  Result Value Ref Range   WBC 6.0 4.0 - 10.5 K/uL   RBC 4.80 3.87 - 5.11 MIL/uL   Hemoglobin 12.3 12.0 - 15.0 g/dL   HCT 38.5 36.0 - 46.0 %   MCV 80.2 80.0 - 100.0 fL   MCH 25.6 (L) 26.0 - 34.0 pg   MCHC 31.9 30.0 - 36.0 g/dL   RDW 13.3 11.5 - 15.5 %   Platelets 206 150 - 400 K/uL   Neutrophils Relative % 56 %   Neutro Abs 3.4 1.7 - 7.7 K/uL   Lymphocytes Relative 34 %   Lymphs Abs 2.0 0.7 - 4.0 K/uL   Monocytes Relative 9 %   Monocytes Absolute 0.5 0.1 - 1.0 K/uL   Eosinophils Relative 1 %   Eosinophils Absolute 0.1 0.0 - 0.5 K/uL   Basophils Relative 0 %   Basophils Absolute 0.0  0.0 - 0.1 K/uL  Comprehensive metabolic panel     Status: Abnormal   Collection Time: 03/18/18  9:52 AM  Result Value Ref Range   Sodium 138 135 - 145 mmol/L   Potassium 3.2 (L) 3.5 - 5.1 mmol/L   Chloride 108 98 - 111 mmol/L   CO2 23 22 - 32 mmol/L   Glucose, Bld 122 (H) 70 - 99 mg/dL   BUN 9 6 - 20 mg/dL   Creatinine, Ser 0.53 0.44 - 1.00 mg/dL   Calcium 9.1 8.9 - 10.3 mg/dL   Total Protein 7.4 6.5 - 8.1 g/dL   Albumin 4.0 3.5 - 5.0 g/dL   AST 17 15 - 41 U/L   ALT 14 0 - 44 U/L   Alkaline Phosphatase 56 38 - 126 U/L   Total Bilirubin 1.1 0.3 - 1.2 mg/dL   GFR calc non Af Amer >60 >60 mL/min   GFR calc Af Amer >60 >60 mL/min   Anion gap 7 5 - 15  Glucose, capillary     Status: Abnormal   Collection Time: 03/18/18  9:58 AM  Result Value Ref Range   Glucose-Capillary 123 (H) 70 - 99 mg/dL    IMAGING No results found.  MAU COURSE Orders Placed This Encounter  Procedures  . Urinalysis, Routine w reflex microscopic  . CBC with Differential/Platelet  . Urinalysis, Microscopic (reflex)  . Comprehensive metabolic panel  . Glucose, capillary  . Orthostatic vital signs  . Pregnancy, urine POC  .  Discharge patient   Meds ordered this encounter  Medications  . ondansetron (ZOFRAN) 4 MG tablet    Sig: Take 1 tablet (4 mg total) by mouth every 8 (eight) hours as needed for nausea or vomiting.    Dispense:  4 tablet    Refill:  0    Order Specific Question:   Supervising Provider    Answer:   Lavonia Drafts [id number]  . potassium chloride SA (K-DUR,KLOR-CON) 20 MEQ tablet    Sig: Take 1 tablet (20 mEq total) by mouth 3 (three) times daily.    Dispense:  6 tablet    Refill:  0    Order Specific Question:   Supervising Provider    Answer:   Lavonia Drafts [[id number]]   Pt feeling normal after Zofran, pushing PO fluids.   MDM - Lightheadedness, nausea and vomiting x 1. Mildly orthostatic. Sx resolved w/ PO hydration. Likely 2/2 inadequate hydration.  - Type 2 DM.  Nml blood sugar in MAU. Pt relieved that blood sugar was normal. Encouraged her to find meter or ask PCP to Rx new one ASAP so that she can check blood sugar if Sx return.  - Mild Hypokalemia. Tx not necessarily indicated, but requests supplementation.    ASSESSMENT Orthostatic light-headedness Nausea and vomiting, not intractable  Type 2 DM  PLAN Discharge home in stable condition. Increase fluid intake Follow-up Information    Lanae Boast, FNP Follow up.   Specialty:  Family Medicine Why:  Call tomorrow to get blood sugar meter Contact information: [place] [address] [place] Follow up.   Specialty:  Emergency Medicine Why:  as needed in medical emergencies Contact information: [address] [id number] [place] [id number]         Allergies as of 03/18/2018   No Known Allergies     Medication List    STOP taking these medications   metroNIDAZOLE 500 MG tablet Commonly known as:  FLAGYL     TAKE these medications   dicyclomine 20 MG tablet Commonly known as:  BENTYL Take 1 tablet (20 mg total) by mouth 3 (three) times daily before meals.   gabapentin 100 MG capsule Commonly known as:  NEURONTIN Take 1 capsule (100 mg total) by mouth at bedtime.   ibuprofen 800 MG tablet Commonly known as:  ADVIL,MOTRIN Take 1 tablet (800 mg total) by mouth every 8 (eight) hours as needed.   LATUDA PO Take by mouth.   lisinopril 2.5 MG tablet Commonly known as:  ZESTRIL Take 1 tablet (2.5 mg total) by mouth daily.   metFORMIN 500 MG tablet Commonly known as:  GLUCOPHAGE Take 500 mg by mouth 2 (two) times daily with a meal.   ondansetron 4 MG tablet Commonly known as:  ZOFRAN Take 1 tablet (4 mg total) by mouth every 8 (eight) hours as needed for nausea or vomiting.   potassium chloride SA 20 MEQ tablet Commonly known as:   K-DUR,KLOR-CON Take 1 tablet (20 mEq total) by mouth 3 (three) times daily.        Tamala Julian, [place], [place] 03/18/2018  11:46 AM

## 2018-03-21 ENCOUNTER — Ambulatory Visit (HOSPITAL_COMMUNITY)
Admission: RE | Admit: 2018-03-21 | Discharge: 2018-03-21 | Disposition: A | Discharge status: Home / Self Care | Payer: Medicaid Other | Source: Ambulatory Visit | Attending: Obstetrics | Admitting: Obstetrics

## 2018-03-21 DIAGNOSIS — R102 Pelvic and perineal pain: Secondary | ICD-10-CM | POA: Diagnosis not present

## 2018-03-21 DIAGNOSIS — G8929 Other chronic pain: Secondary | ICD-10-CM | POA: Diagnosis not present

## 2018-03-21 DIAGNOSIS — N83201 Unspecified ovarian cyst, right side: Secondary | ICD-10-CM | POA: Diagnosis not present

## 2018-03-26 ENCOUNTER — Telehealth: Payer: Self-pay

## 2018-03-26 ENCOUNTER — Other Ambulatory Visit: Payer: Self-pay | Admitting: Family Medicine

## 2018-03-26 MED ORDER — BLOOD GLUCOSE MONITOR KIT: PACK | 0 refills | Status: DC

## 2018-03-26 NOTE — Telephone Encounter (Signed)
Sent to pharmacy 

## 2018-03-26 NOTE — Telephone Encounter (Signed)
Called, no answer.

## 2018-03-26 NOTE — Telephone Encounter (Signed)
Sent to the pharmacy

## 2018-03-26 NOTE — Telephone Encounter (Signed)
Patient is asking if a glucose meter can be prescribed. Please advise. She only takes metformin. Please send to [facility] on [address].

## 2018-03-29 ENCOUNTER — Ambulatory Visit: Payer: Medicaid Other | Admitting: Family Medicine

## 2018-04-05 ENCOUNTER — Ambulatory Visit (INDEPENDENT_AMBULATORY_CARE_PROVIDER_SITE_OTHER): Payer: Medicaid Other | Admitting: Family Medicine

## 2018-04-05 ENCOUNTER — Telehealth: Payer: Self-pay

## 2018-04-05 ENCOUNTER — Encounter: Payer: Self-pay | Admitting: Family Medicine

## 2018-04-05 VITALS — BP 129/73 | HR 92 | Temp 98.6°F | Resp 14 | Ht 61.0 in | Wt 137.0 lb

## 2018-04-05 DIAGNOSIS — E118 Type 2 diabetes mellitus with unspecified complications: Secondary | ICD-10-CM

## 2018-04-05 DIAGNOSIS — N912 Amenorrhea, unspecified: Secondary | ICD-10-CM | POA: Diagnosis not present

## 2018-04-05 DIAGNOSIS — N760 Acute vaginitis: Secondary | ICD-10-CM

## 2018-04-05 DIAGNOSIS — Z113 Encounter for screening for infections with a predominantly sexual mode of transmission: Secondary | ICD-10-CM | POA: Diagnosis not present

## 2018-04-05 LAB — POCT URINALYSIS DIPSTICK
Bilirubin, UA: NEGATIVE
Glucose, UA: NEGATIVE
Ketones, UA: NEGATIVE
Leukocytes, UA: NEGATIVE
Nitrite, UA: NEGATIVE
Protein, UA: POSITIVE — AB
Spec Grav, UA: 1.025 (ref 1.010–1.025)
Urobilinogen, UA: 0.2 E.U./dL
pH, UA: 6.5 (ref 5.0–8.0)

## 2018-04-05 LAB — POCT URINE PREGNANCY: Preg Test, Ur: NEGATIVE

## 2018-04-05 MED ORDER — FLUCONAZOLE 150 MG PO TABS: 150.0000 mg | ORAL_TABLET | Freq: Once | ORAL | 0 refills | Status: AC

## 2018-04-05 MED ORDER — METFORMIN HCL 500 MG PO TABS: 500.0000 mg | ORAL_TABLET | Freq: Two times a day (BID) | ORAL | 5 refills | Status: DC

## 2018-04-05 NOTE — Telephone Encounter (Signed)
Pt called and left message wanting to know her u/s results. Attempted to return pt call with no answer. LM on Vm for pt to return call to office. Per Dr. Jodi Mourning cyst will resolve on its own. No further workup needed at this time

## 2018-04-05 NOTE — Patient Instructions (Signed)
Diabetes Mellitus and Nutrition, Adult When you have diabetes (diabetes mellitus), it is very important to have healthy eating habits because your blood sugar (glucose) levels are greatly affected by what you eat and drink. Eating healthy foods in the appropriate amounts, at about the same times every day, can help you:  Control your blood glucose.  Lower your risk of heart disease.  Improve your blood pressure.  Reach or maintain a healthy weight. Every person with diabetes is different, and each person has different needs for a meal plan. Your health care provider may recommend that you work with a diet and nutrition specialist (dietitian) to make a meal plan that is best for you. Your meal plan may vary depending on factors such as:  The calories you need.  The medicines you take.  Your weight.  Your blood glucose, blood pressure, and cholesterol levels.  Your activity level.  Other health conditions you have, such as heart or kidney disease. How do carbohydrates affect me? Carbohydrates, also called carbs, affect your blood glucose level more than any other type of food. Eating carbs naturally raises the amount of glucose in your blood. Carb counting is a method for keeping track of how many carbs you eat. Counting carbs is important to keep your blood glucose at a healthy level, especially if you use insulin or take certain oral diabetes medicines. It is important to know how many carbs you can safely have in each meal. This is different for every person. Your dietitian can help you calculate how many carbs you should have at each meal and for each snack. Foods that contain carbs include:  Bread, cereal, rice, pasta, and crackers.  Potatoes and corn.  Peas, beans, and lentils.  Milk and yogurt.  Fruit and juice.  Desserts, such as cakes, cookies, ice cream, and candy. How does alcohol affect me? Alcohol can cause a sudden decrease in blood glucose (hypoglycemia),  especially if you use insulin or take certain oral diabetes medicines. Hypoglycemia can be a life-threatening condition. Symptoms of hypoglycemia (sleepiness, dizziness, and confusion) are similar to symptoms of having too much alcohol. If your health care provider says that alcohol is safe for you, follow these guidelines:  Limit alcohol intake to no more than 1 drink per day for nonpregnant women and 2 drinks per day for men. One drink equals 12 oz of beer, 5 oz of wine, or 1 oz of hard liquor.  Do not drink on an empty stomach.  Keep yourself hydrated with water, diet soda, or unsweetened iced tea.  Keep in mind that regular soda, juice, and other mixers may contain a lot of sugar and must be counted as carbs. What are tips for following this plan?  Reading food labels  Start by checking the serving size on the "Nutrition Facts" label of packaged foods and drinks. The amount of calories, carbs, fats, and other nutrients listed on the label is based on one serving of the item. Many items contain more than one serving per package.  Check the total grams (g) of carbs in one serving. You can calculate the number of servings of carbs in one serving by dividing the total carbs by 15. For example, if a food has 30 g of total carbs, it would be equal to 2 servings of carbs.  Check the number of grams (g) of saturated and trans fats in one serving. Choose foods that have low or no amount of these fats.  Check the number of   milligrams (mg) of salt (sodium) in one serving. Most people should limit total sodium intake to less than 2,300 mg per day.  Always check the nutrition information of foods labeled as "low-fat" or "nonfat". These foods may be higher in added sugar or refined carbs and should be avoided.  Talk to your dietitian to identify your daily goals for nutrients listed on the label. Shopping  Avoid buying canned, premade, or processed foods. These foods tend to be high in fat, sodium,  and added sugar.  Shop around the outside edge of the grocery store. This includes fresh fruits and vegetables, bulk grains, fresh meats, and fresh dairy. Cooking  Use low-heat cooking methods, such as baking, instead of high-heat cooking methods like deep frying.  Cook using healthy oils, such as olive, canola, or sunflower oil.  Avoid cooking with butter, cream, or high-fat meats. Meal planning  Eat meals and snacks regularly, preferably at the same times every day. Avoid going long periods of time without eating.  Eat foods high in fiber, such as fresh fruits, vegetables, beans, and whole grains. Talk to your dietitian about how many servings of carbs you can eat at each meal.  Eat 4-6 ounces (oz) of lean protein each day, such as lean meat, chicken, fish, eggs, or tofu. One oz of lean protein is equal to: ? 1 oz of meat, chicken, or fish. ? 1 egg. ?  cup of tofu.  Eat some foods each day that contain healthy fats, such as avocado, nuts, seeds, and fish. Lifestyle  Check your blood glucose regularly.  Exercise regularly as told by your health care provider. This may include: ? 150 minutes of moderate-intensity or vigorous-intensity exercise each week. This could be brisk walking, biking, or water aerobics. ? Stretching and doing strength exercises, such as yoga or weightlifting, at least 2 times a week.  Take medicines as told by your health care provider.  Do not use any products that contain nicotine or tobacco, such as cigarettes and e-cigarettes. If you need help quitting, ask your health care provider.  Work with a Social worker or diabetes educator to identify strategies to manage stress and any emotional and social challenges. Questions to ask a health care provider  Do I need to meet with a diabetes educator?  Do I need to meet with a dietitian?  What number can I call if I have questions?  When are the best times to check my blood glucose? Where to find more  information:  American Diabetes Association: diabetes.org  Academy of Nutrition and Dietetics: www.eatright.[employer] of Diabetes and Digestive and Kidney Diseases (NIH): [employer] Summary  A healthy meal plan will help you control your blood glucose and maintain a healthy lifestyle.  Working with a diet and nutrition specialist (dietitian) can help you make a meal plan that is best for you.  Keep in mind that carbohydrates (carbs) and alcohol have immediate effects on your blood glucose levels. It is important to count carbs and to use alcohol carefully. This information is not intended to replace advice given to you by your health care provider. Make sure you discuss any questions you have with your health care provider. Document Released: 12/16/2004 Document Revised: 10/19/2016 Document Reviewed: 04/25/2016 Elsevier Interactive Patient Education  2019 Elsevier Inc. Vaginitis  Vaginitis is irritation and swelling (inflammation) of the vagina. It happens when normal bacteria and yeast in the vagina grow too much. There are many types of this condition. Treatment will depend on  the type you have. Follow these instructions at home: Lifestyle  Keep your vagina area clean and dry. ? Avoid using soap. ? Rinse the area with water.  Do not do the following until your doctor says it is okay: ? Wash and clean out the vagina (douche). ? Use tampons. ? Have sex.  Wipe from front to back after going to the bathroom.  Let air reach your vagina. ? Wear cotton underwear. ? Do not wear: ? Underwear while you sleep. ? Tight pants. ? Thong underwear. ? Underwear or nylons without a cotton panel. ? Take off any wet clothing, such as bathing suits, as soon as possible.  Use gentle, non-scented products. Do not use things that can irritate the vagina, such as fabric softeners. Avoid the following products if they are scented: ? Feminine  sprays. ? Detergents. ? Tampons. ? Feminine hygiene products. ? Soaps or bubble baths.  Practice safe sex and use condoms. General instructions  Take over-the-counter and prescription medicines only as told by your doctor.  If you were prescribed an antibiotic medicine, take or use it as told by your doctor. Do not stop taking or using the antibiotic even if you start to feel better.  Keep all follow-up visits as told by your doctor. This is important. Contact a doctor if:  You have pain in your belly.  You have a fever.  Your symptoms last for more than 2-3 days. Get help right away if:  You have a fever and your symptoms get worse all of a sudden. Summary  Vaginitis is irritation and swelling of the vagina. It can happen when the normal bacteria and yeast in the vagina grow too much. There are many types.  Treatment will depend on the type you have.  Do not douche, use tampons , or have sex until your health care provider approves. When you can return to sex, practice safe sex and use condoms. This information is not intended to replace advice given to you by your health care provider. Make sure you discuss any questions you have with your health care provider. Document Released: 06/17/2008 Document Revised: 04/12/2016 Document Reviewed: 04/12/2016 Elsevier Interactive Patient Education  2019 [employer].

## 2018-04-05 NOTE — Progress Notes (Signed)
Patient [facility]   Progress Note: General Provider: Lanae Boast, FNP  SUBJECTIVE:   Stephanie Mack is a 28 y.o. female who  has a past medical history of ADHD (attention deficit hyperactivity disorder), Anemia, Anxiety, Bipolar 1 disorder ([place]), Chlamydia (05-31-10), Chronic constipation, Depression, GERD (gastroesophageal reflux disease), Gestational diabetes, Gonorrhea contact, treated, Headache, Infection, Pregnancy induced hypertension, Pseudocyesis (2013), Sickle cell trait ([place]), and Type 2 diabetes mellitus ([place]) (02/13/2017).. Patient presents today for Diabetes; Late Period (request pregnancy test ); and Vaginal Discharge (wants to be checked for stds. Discharge has an odor )  Patient states that she is unsure of LMP. Reports 1 female partner in the past 3 months with 0% condom use. She states that the vaginal discharge is foul smelling. Patient denies abdominal pain but reports vaginal pain with going outside in the cold air.  Patient does not check blood glucose levels. Denies hypoglycemic episodes.  Review of Systems  Constitutional: Negative.   HENT: Negative.   Eyes: Negative.   Respiratory: Negative.   Cardiovascular: Negative.   Gastrointestinal: Negative.   Genitourinary: Negative.        Vaginal discharge.   Musculoskeletal: Negative.   Skin: Negative.   Neurological: Negative.   Psychiatric/Behavioral: Negative.      OBJECTIVE: BP 129/73 (BP Location: Right Arm, Patient Position: Sitting, Cuff Size: Normal)   Pulse 92   Temp 98.6 F (37 C) (Oral)   Resp 14   Ht 5\' 1"  (1.549 m)   Wt 137 lb (62.1 kg)   SpO2 100%   BMI 25.89 kg/m   Wt Readings from Last 3 Encounters:  04/05/18 137 lb (62.1 kg)  03/18/18 135 lb (61.2 kg)  03/08/18 134 lb (60.8 kg)     Physical Exam Vitals signs and nursing note reviewed. Exam conducted with a chaperone present.  Constitutional:      General: She is not in acute distress.  Appearance: She is well-developed.  HENT:     Head: Normocephalic and atraumatic.  Eyes:     Conjunctiva/sclera: Conjunctivae normal.     Pupils: Pupils are equal, round, and reactive to light.  Neck:     Musculoskeletal: Normal range of motion.  Cardiovascular:     Rate and Rhythm: Normal rate and regular rhythm.     Heart sounds: Normal heart sounds.  Pulmonary:     Effort: Pulmonary effort is normal. No respiratory distress.     Breath sounds: Normal breath sounds.  Abdominal:     General: Bowel sounds are normal. There is no distension.     Palpations: Abdomen is soft.  Genitourinary:    General: Normal vulva.     Exam position: Supine.     Pubic Area: Rash present.     Vagina: Vaginal discharge present.     Cervix: Normal.     Uterus: Normal.      Adnexa: Right adnexa normal and left adnexa normal.     Rectum: Normal.  Musculoskeletal: Normal range of motion.  Skin:    General: Skin is warm and dry.  Neurological:     Mental Status: She is alert and oriented to person, place, and time.  Psychiatric:        Behavior: Behavior normal.        Thought Content: Thought content normal.     ASSESSMENT/PLAN:   1. Type 2 diabetes mellitus with complication, without long-term current use of insulin ([place]) Pending labs. Will adjust medications accordingly.    -  Urinalysis Dipstick  2. Amenorrhea Patient with abnormal menstruation. Negative pregnancy test today. Discussed safe sex.  - POCT urine pregnancy  3. Acute vaginitis Swabs obtained. Will start diflucan today. Results of swabs pending.  - Vaginitis/Vaginosis, DNA Probe - Chlamydia/GC NAA, Confirmation  4. Screening for venereal disease - Chlamydia/GC NAA, Confirmation   Return in about 3 months (around 07/05/2018) for fasting labs DM2.    The patient was given clear instructions to go to ER or return to medical center if symptoms do not improve, worsen or new problems develop. The patient verbalized  understanding and agreed with plan of care.   Ms. Doug Sou. Nathaneil Canary, FNP-BC [facility] [address] [place], [place] [address] [phone]

## 2018-04-07 LAB — SPECIMEN STATUS REPORT

## 2018-04-07 LAB — VAGINITIS/VAGINOSIS, DNA PROBE
Candida Species: NEGATIVE
Gardnerella vaginalis: POSITIVE — AB
Trichomonas vaginosis: NEGATIVE

## 2018-04-09 ENCOUNTER — Telehealth: Payer: Self-pay | Admitting: *Deleted

## 2018-04-09 NOTE — Telephone Encounter (Signed)
Pt made aware of u/s results.

## 2018-04-10 LAB — GC/CHLAMYDIA PROBE AMP

## 2018-04-15 ENCOUNTER — Emergency Department (HOSPITAL_COMMUNITY)
Admission: EM | Admit: 2018-04-15 | Discharge: 2018-04-15 | Disposition: A | Discharge status: Home / Self Care | Payer: Medicaid Other | Attending: Emergency Medicine | Admitting: Emergency Medicine

## 2018-04-15 ENCOUNTER — Other Ambulatory Visit: Payer: Self-pay

## 2018-04-15 ENCOUNTER — Encounter (HOSPITAL_COMMUNITY): Payer: Self-pay | Admitting: Emergency Medicine

## 2018-04-15 DIAGNOSIS — F419 Anxiety disorder, unspecified: Secondary | ICD-10-CM | POA: Diagnosis not present

## 2018-04-15 DIAGNOSIS — D573 Sickle-cell trait: Secondary | ICD-10-CM | POA: Diagnosis not present

## 2018-04-15 DIAGNOSIS — F319 Bipolar disorder, unspecified: Secondary | ICD-10-CM | POA: Diagnosis not present

## 2018-04-15 DIAGNOSIS — E119 Type 2 diabetes mellitus without complications: Secondary | ICD-10-CM | POA: Insufficient documentation

## 2018-04-15 DIAGNOSIS — N939 Abnormal uterine and vaginal bleeding, unspecified: Secondary | ICD-10-CM | POA: Insufficient documentation

## 2018-04-15 DIAGNOSIS — R1032 Left lower quadrant pain: Secondary | ICD-10-CM

## 2018-04-15 DIAGNOSIS — B9689 Other specified bacterial agents as the cause of diseases classified elsewhere: Secondary | ICD-10-CM

## 2018-04-15 DIAGNOSIS — F909 Attention-deficit hyperactivity disorder, unspecified type: Secondary | ICD-10-CM | POA: Insufficient documentation

## 2018-04-15 DIAGNOSIS — N760 Acute vaginitis: Secondary | ICD-10-CM | POA: Diagnosis not present

## 2018-04-15 LAB — URINALYSIS, ROUTINE W REFLEX MICROSCOPIC
Bilirubin Urine: NEGATIVE
Glucose, UA: NEGATIVE mg/dL
Ketones, ur: NEGATIVE mg/dL
LEUKOCYTES UA: NEGATIVE
Nitrite: NEGATIVE
PH: 5 (ref 5.0–8.0)
Protein, ur: 30 mg/dL — AB
Specific Gravity, Urine: 1.026 (ref 1.005–1.030)

## 2018-04-15 LAB — WET PREP, GENITAL
Sperm: NONE SEEN
Trich, Wet Prep: NONE SEEN
Yeast Wet Prep HPF POC: NONE SEEN

## 2018-04-15 LAB — PREGNANCY, URINE: Preg Test, Ur: NEGATIVE

## 2018-04-15 MED ORDER — METRONIDAZOLE 500 MG PO TABS: 500.0000 mg | ORAL_TABLET | Freq: Two times a day (BID) | ORAL | 0 refills | Status: AC

## 2018-04-15 NOTE — ED Triage Notes (Signed)
Reports cyst in lower abd that's causing abd pain x3 weeks.  Reports hematuria

## 2018-04-15 NOTE — ED Provider Notes (Signed)
[facility] Emergency Department Provider Note MRN:  [id number]  Arrival date & time: 04/15/18     Chief Complaint   Abdominal Pain   History of Present Illness   Stephanie Mack is a 28 y.o. year-old female with a history of bipolar 1 disorder presenting to the ED with chief complaint of abdominal pain.  3 days of left lower quadrant abdominal pain, constant, described as a crampy pain, associated with vaginal bleeding.  Using 2 pads per day.  Patient explains that she normally uses the Depakote shot for birth control, has been off it for several months.  Explains that the shot was interfering with her mood.  Patient is unsure if she is pregnant.  Denies nausea or vomiting, endorsing intermittent diarrhea recently but also is constipated at times.  Review of Systems  A complete 10 system review of systems was obtained and all systems are negative except as noted in the HPI and PMH.   Patient's Health History    Past Medical History:  Diagnosis Date  . ADHD (attention deficit hyperactivity disorder)   . Anemia   . Anxiety   . Bipolar 1 disorder ([place])   . Chlamydia 05-31-10  . Chronic constipation   . Depression    depression  . GERD (gastroesophageal reflux disease)    with pregnancy  . Gestational diabetes    current pregnancy  . Gonorrhea contact, treated   . Headache   . Infection    UTI  . Pregnancy induced hypertension    previous pregnancy  . Pseudocyesis 2013   Seen in MAU for percieved FM, abd distension. Normal exam.   . Sickle cell trait ([place])   . Type 2 diabetes mellitus ([place]) 02/13/2017    Past Surgical History:  Procedure Laterality Date  . CESAREAN SECTION N/A 09/01/2012   Procedure:  Primary cesarean section with delivery of baby girl at 3.  Apgars 1/1.  ;  Surgeon: Frederico Hamman, MD;  Location: [facility];  Service: Obstetrics;  Laterality: N/A;  . CESAREAN SECTION N/A 01/07/2015   Procedure: REPEAT CESAREAN SECTION;  Surgeon:  Shelly Bombard, MD;  Location: [facility];  Service: Obstetrics;  Laterality: N/A;  . NASAL SEPTUM SURGERY    . WISDOM TOOTH EXTRACTION      Family History  Problem Relation Age of Onset  . Kidney disease Mother   . Hypertension Father   . Cancer Sister   . Asthma Brother   . Diabetes Maternal Grandfather   . Heart disease Neg Hx     Social History   Socioeconomic History  . Marital status: Single    Spouse name: Not on file  . Number of children: Not on file  . Years of education: Not on file  . Highest education level: Not on file  Occupational History  . Not on file  Social Needs  . Financial resource strain: Not on file  . Food insecurity:    Worry: Not on file    Inability: Not on file  . Transportation needs:    Medical: Not on file    Non-medical: Not on file  Tobacco Use  . Smoking status: Never Smoker  . Smokeless tobacco: Never Used  Substance and Sexual Activity  . Alcohol use: No    Alcohol/week: 0.0 standard drinks  . Drug use: No  . Sexual activity: Yes    Partners: Male    Birth control/protection: None    Comment: last intercourse in august 2019  Lifestyle  . Physical activity:    Days per week: Not on file    Minutes per session: Not on file  . Stress: Not on file  Relationships  . Social connections:    Talks on phone: Not on file    Gets together: Not on file    Attends religious service: Not on file    Active member of club or organization: Not on file    Attends meetings of clubs or organizations: Not on file    Relationship status: Not on file  . Intimate partner violence:    Fear of current or ex partner: No    Emotionally abused: No    Physically abused: No    Forced sexual activity: No  Other Topics Concern  . Not on file  Social History Narrative   Single, one daughter born 2016            Physical Exam  Vital Signs and Nursing Notes reviewed Vitals:   04/15/18 1608  BP: 116/72  Pulse: 94  Resp: 16  Temp: 98.5 F (36.9  C)  SpO2: 98%    CONSTITUTIONAL: Well-appearing, NAD NEURO:  Alert and oriented x 3, no focal deficits EYES:  eyes equal and reactive ENT/NECK:  no LAD, no JVD CARDIO: Regular rate, well-perfused, normal S1 and S2 PULM:  CTAB no wheezing or rhonchi GI/GU:  normal bowel sounds, non-distended, non-tender; normal-appearing external genitalia, no significant adnexal tenderness or masses, no cervical motion tenderness, scant blood in the vaginal vault with no significant purulent discharge exam chaperoned by female nurse MSK/SPINE:  No gross deformities, no edema SKIN:  no rash, atraumatic PSYCH:  Appropriate speech and behavior  Diagnostic and Interventional Summary    Labs Reviewed  WET PREP, GENITAL - Abnormal; Notable for the following components:      Result Value   Clue Cells Wet Prep HPF POC PRESENT (*)    WBC, Wet Prep HPF POC FEW (*)    All other components within normal limits  URINALYSIS, ROUTINE W REFLEX MICROSCOPIC - Abnormal; Notable for the following components:   APPearance HAZY (*)    Hgb urine dipstick LARGE (*)    Protein, ur 30 (*)    Bacteria, UA RARE (*)    All other components within normal limits  PREGNANCY, URINE  GC/CHLAMYDIA PROBE AMP ([place]) NOT AT [facility]    No orders to display    Medications - No data to display   Procedures Critical Care  ED Course and Medical Decision Making  I have reviewed the triage vital signs and the nursing notes.  Pertinent labs & imaging results that were available during my care of the patient were reviewed by me and considered in my medical decision making (see below for details).  Favoring a regular uterine bleeding related to inconsistent birth control, pain likely related to menses, less likely new ovarian cyst.  Patient had a recent ultrasound last month showing a right-sided ovarian cyst but her pain is on the left today.  Largely nontender on exam, normal vital signs.  Pelvic exam non-concerning, awaiting wet  prep and GC.  Patient does have a history of gonorrhea and chlamydia in the past.  Clue cells found on wet prep, patient continues to be well-appearing with normal vital signs and a reassuring abdominal exam, appropriate for discharge with Flagyl.  After the discussed management above, the patient was determined to be safe for discharge.  The patient was in agreement with this plan and  all questions regarding their care were answered.  ED return precautions were discussed and the patient will return to the ED with any significant worsening of condition.  Barth Kirks. Sedonia Small, [place] [email or web address]  Final Clinical Impressions(s) / ED Diagnoses     ICD-10-CM   1. BV (bacterial vaginosis) N76.0    B96.89   2. Vaginal bleeding N93.9   3. Left lower quadrant abdominal pain R10.32     ED Discharge Orders         Ordered    metroNIDAZOLE (FLAGYL) 500 MG tablet  2 times daily     04/15/18 1841             Maudie Flakes, MD 04/15/18 1842

## 2018-04-15 NOTE — ED Notes (Signed)
ED Provider at bedside. 

## 2018-04-15 NOTE — Discharge Instructions (Addendum)
You were evaluated in the Emergency Department and after careful evaluation, we did not find any emergent condition requiring admission or further testing in the hospital.  Your symptoms today seem to be due to bacterial vaginosis.  Please take the antibiotic as directed and follow-up with your regular doctor.  Please return to the Emergency Department if you experience any worsening of your condition.  We encourage you to follow up with a primary care provider.  Thank you for allowing [place] to be a part of your care.

## 2018-04-17 ENCOUNTER — Ambulatory Visit: Payer: Self-pay | Admitting: Obstetrics

## 2018-04-17 LAB — GC/CHLAMYDIA PROBE AMP (~~LOC~~) NOT AT ARMC
Chlamydia: NEGATIVE
Neisseria Gonorrhea: NEGATIVE

## 2018-04-18 ENCOUNTER — Other Ambulatory Visit: Payer: Self-pay | Admitting: Family Medicine

## 2018-04-30 ENCOUNTER — Other Ambulatory Visit (HOSPITAL_COMMUNITY)
Admission: RE | Admit: 2018-04-30 | Discharge: 2018-04-30 | Disposition: A | Discharge status: Home / Self Care | Payer: Medicaid Other | Source: Ambulatory Visit | Attending: Obstetrics | Admitting: Obstetrics

## 2018-04-30 ENCOUNTER — Ambulatory Visit (INDEPENDENT_AMBULATORY_CARE_PROVIDER_SITE_OTHER): Payer: Medicaid Other | Admitting: Obstetrics

## 2018-04-30 ENCOUNTER — Encounter: Payer: Self-pay | Admitting: Obstetrics

## 2018-04-30 VITALS — BP 144/87 | HR 82 | Wt 142.0 lb

## 2018-04-30 DIAGNOSIS — N760 Acute vaginitis: Secondary | ICD-10-CM | POA: Diagnosis not present

## 2018-04-30 DIAGNOSIS — B9689 Other specified bacterial agents as the cause of diseases classified elsewhere: Secondary | ICD-10-CM

## 2018-04-30 DIAGNOSIS — R102 Pelvic and perineal pain: Secondary | ICD-10-CM

## 2018-04-30 DIAGNOSIS — N83201 Unspecified ovarian cyst, right side: Secondary | ICD-10-CM | POA: Diagnosis not present

## 2018-04-30 MED ORDER — METRONIDAZOLE 0.75 % VA GEL: 1.0000 | Freq: Two times a day (BID) | VAGINAL | 5 refills | Status: DC

## 2018-04-30 NOTE — Progress Notes (Addendum)
Patient ID: Stephanie Mack, female   DOB: November 11, 1990, 28 y.o.   MRN: [id number]  Chief Complaint  Patient presents with  . Gynecologic Exam    lower right side pain    HPI Stephanie Mack is a 28 y.o. female.  Persistent RLQ abdominal pain. HPI  Past Medical History:  Diagnosis Date  . ADHD (attention deficit hyperactivity disorder)   . Anemia   . Anxiety   . Bipolar 1 disorder ([place])   . Chlamydia 05-31-10  . Chronic constipation   . Depression    depression  . GERD (gastroesophageal reflux disease)    with pregnancy  . Gestational diabetes    current pregnancy  . Gonorrhea contact, treated   . Headache   . Infection    UTI  . Pregnancy induced hypertension    previous pregnancy  . Pseudocyesis 2013   Seen in MAU for percieved FM, abd distension. Normal exam.   . Sickle cell trait ([place])   . Type 2 diabetes mellitus ([place]) 02/13/2017    Past Surgical History:  Procedure Laterality Date  . CESAREAN SECTION N/A 09/01/2012   Procedure:  Primary cesarean section with delivery of baby girl at 57.  Apgars 1/1.  ;  Surgeon: Frederico Hamman, MD;  Location: [facility];  Service: Obstetrics;  Laterality: N/A;  . CESAREAN SECTION N/A 01/07/2015   Procedure: REPEAT CESAREAN SECTION;  Surgeon: Shelly Bombard, MD;  Location: [facility];  Service: Obstetrics;  Laterality: N/A;  . NASAL SEPTUM SURGERY    . WISDOM TOOTH EXTRACTION      Family History  Problem Relation Age of Onset  . Kidney disease Mother   . Hypertension Father   . Cancer Sister   . Asthma Brother   . Diabetes Maternal Grandfather   . Heart disease Neg Hx     Social History Social History   Tobacco Use  . Smoking status: Never Smoker  . Smokeless tobacco: Never Used  Substance Use Topics  . Alcohol use: No    Alcohol/week: 0.0 standard drinks  . Drug use: No    No Known Allergies  Current Outpatient Medications  Medication Sig Dispense Refill  . ACCU-CHEK FASTCLIX LANCETS MISC TEST UPTO 4 TIMES DAILY  102 each 3  . ARIPiprazole (ABILIFY) 20 MG tablet Take 20 mg by mouth daily. Sees mental health. Unsure of dosage    . blood glucose meter kit and supplies KIT Dispense based on patient and insurance preference. Use up to four times daily as directed. (FOR ICD-9 250.00, 250.01). 1 each 0  . dicyclomine (BENTYL) 20 MG tablet Take 1 tablet (20 mg total) by mouth 3 (three) times daily before meals. (Patient not taking: Reported on 04/05/2018) 90 tablet 5  . gabapentin (NEURONTIN) 100 MG capsule Take 1 capsule (100 mg total) by mouth at bedtime. 90 capsule 3  . glucose blood test strip TEST UPTO 4 TIMES DAILY 100 each 3  . ibuprofen (ADVIL,MOTRIN) 800 MG tablet Take 1 tablet (800 mg total) by mouth every 8 (eight) hours as needed. 30 tablet 5  . lisinopril (ZESTRIL) 2.5 MG tablet Take 1 tablet (2.5 mg total) by mouth daily. (Patient not taking: Reported on 04/05/2018) 90 tablet 2  . Lurasidone HCl (LATUDA PO) Take by mouth.    . metFORMIN (GLUCOPHAGE) 500 MG tablet Take 1 tablet (500 mg total) by mouth 2 (two) times daily with a meal. 60 tablet 5  . metroNIDAZOLE (METROGEL VAGINAL) 0.75 % vaginal gel Place 1 Applicatorful  vaginally 2 (two) times daily. 70 g 5  . ondansetron (ZOFRAN) 4 MG tablet Take 1 tablet (4 mg total) by mouth every 8 (eight) hours as needed for nausea or vomiting. 4 tablet 0  . potassium chloride SA (K-DUR,KLOR-CON) 20 MEQ tablet Take 1 tablet (20 mEq total) by mouth 3 (three) times daily. 6 tablet 0   No current facility-administered medications for this visit.     Review of Systems Review of Systems Constitutional: negative for fatigue and weight loss Respiratory: negative for cough and wheezing Cardiovascular: negative for chest pain, fatigue and palpitations Gastrointestinal: negative for abdominal pain and change in bowel habits Genitourinary: positive for abdominal pain - RLQ Integument/breast: negative for nipple discharge Musculoskeletal:negative for  myalgias Neurological: negative for gait problems and tremors Behavioral/Psych: negative for abusive relationship, depression Endocrine: negative for temperature intolerance      Blood pressure (!) 144/87, pulse 82, weight 142 lb (64.4 kg).  Physical Exam Physical Exam General:   alert  Skin:   no rash or abnormalities  Lungs:   clear to auscultation bilaterally  Heart:   regular rate and rhythm, S1, S2 normal, no murmur, click, rub or gallop  Breasts:   normal without suspicious masses, skin or nipple changes or axillary nodes  Abdomen:  normal findings: no organomegaly, soft, non-tender and no hernia  Pelvis:  External genitalia: normal general appearance Urinary system: urethral meatus normal and bladder without fullness, nontender Vaginal: normal without tenderness, induration or masses Cervix: normal appearance Adnexa: tender adnexa.  No masses palpable Uterus: anteverted and non-tender, normal size.    50% of 15 min visit spent on counseling and coordination of care.   Data Reviewed Ultrasound  Assessment and Plan:    1. BV (bacterial vaginosis) - Flagyl Rx but she did not tolerate Rx: - metroNIDAZOLE (METROGEL VAGINAL) 0.75 % vaginal gel; Place 1 Applicatorful vaginally 2 (two) times daily.  Dispense: 70 g; Refill: 5 - Cervicovaginal ancillary only( [place])  2. Pelvic pain Rx: - Cervicovaginal ancillary only( [place])  3. Cyst of right ovary Rx: - US PELVIC COMPLETE WITH TRANSVAGINAL; Future    Plan    FOLLOW UP IN 2 WEEKS FOR RESULTS  Orders Placed This Encounter  Procedures  . US PELVIC COMPLETE WITH TRANSVAGINAL    Standing Status:   Future    Standing Expiration Date:   06/29/2019    Order Specific Question:   Reason for Exam (SYMPTOM  OR DIAGNOSIS REQUIRED)    Answer:   Pelvic pain - RLQ.  H/ O small right simple ovarian cyst.    Order Specific Question:   Preferred imaging location?    Answer:   [facility]   Meds ordered this  encounter  Medications  . metroNIDAZOLE (METROGEL VAGINAL) 0.75 % vaginal gel    Sig: Place 1 Applicatorful vaginally 2 (two) times daily.    Dispense:  70 g    Refill:  5      Shelly Bombard MD 04-30-2018

## 2018-05-01 LAB — CERVICOVAGINAL ANCILLARY ONLY
Bacterial vaginitis: POSITIVE — AB
Candida vaginitis: NEGATIVE

## 2018-05-02 ENCOUNTER — Other Ambulatory Visit: Payer: Self-pay | Admitting: Obstetrics

## 2018-05-04 ENCOUNTER — Ambulatory Visit (HOSPITAL_COMMUNITY)
Admission: RE | Admit: 2018-05-04 | Discharge: 2018-05-04 | Disposition: A | Discharge status: Home / Self Care | Payer: Medicaid Other | Source: Ambulatory Visit | Attending: Obstetrics | Admitting: Obstetrics

## 2018-05-04 ENCOUNTER — Other Ambulatory Visit: Payer: Self-pay | Admitting: Obstetrics

## 2018-05-04 DIAGNOSIS — N83201 Unspecified ovarian cyst, right side: Secondary | ICD-10-CM | POA: Insufficient documentation

## 2018-05-18 ENCOUNTER — Ambulatory Visit (INDEPENDENT_AMBULATORY_CARE_PROVIDER_SITE_OTHER): Payer: Medicaid Other | Admitting: Obstetrics

## 2018-05-18 ENCOUNTER — Encounter: Payer: Self-pay | Admitting: Obstetrics

## 2018-05-18 VITALS — BP 131/83 | HR 94 | Wt 146.6 lb

## 2018-05-18 DIAGNOSIS — R102 Pelvic and perineal pain: Secondary | ICD-10-CM | POA: Diagnosis not present

## 2018-05-18 DIAGNOSIS — N83201 Unspecified ovarian cyst, right side: Secondary | ICD-10-CM | POA: Diagnosis not present

## 2018-05-18 NOTE — Progress Notes (Signed)
Pt presents to f/u after pelvic u/s d/t RLQ pain.  Pt still having pain.

## 2018-05-18 NOTE — Progress Notes (Signed)
Patient ID: Stephanie Mack, female   DOB: 1990-09-09, 28 y.o.   MRN: [id number]  Chief Complaint  Patient presents with  . Follow-up    HPI Stephanie Mack is a 28 y.o. female.   HPI Persistent RLQ pelvic pain.  Has history of right ovarian cyst, resolving on U/S.  Past Medical History:  Diagnosis Date  . ADHD (attention deficit hyperactivity disorder)   . Anemia   . Anxiety   . Bipolar 1 disorder ([place])   . Chlamydia 05-31-10  . Chronic constipation   . Depression    depression  . GERD (gastroesophageal reflux disease)    with pregnancy  . Gestational diabetes    current pregnancy  . Gonorrhea contact, treated   . Headache   . Infection    UTI  . Pregnancy induced hypertension    previous pregnancy  . Pseudocyesis 2013   Seen in [facility] for percieved FM, abd distension. Normal exam.   . Sickle cell trait ([place])   . Type 2 diabetes mellitus ([place]) 02/13/2017    Past Surgical History:  Procedure Laterality Date  . CESAREAN SECTION N/A 09/01/2012   Procedure:  Primary cesarean section with delivery of baby girl at 64.  Apgars 1/1.  ;  Surgeon: Frederico Hamman, MD;  Location: [facility];  Service: Obstetrics;  Laterality: N/A;  . CESAREAN SECTION N/A 01/07/2015   Procedure: REPEAT CESAREAN SECTION;  Surgeon: Shelly Bombard, MD;  Location: [facility];  Service: Obstetrics;  Laterality: N/A;  . NASAL SEPTUM SURGERY    . WISDOM TOOTH EXTRACTION      Family History  Problem Relation Age of Onset  . Kidney disease Mother   . Hypertension Father   . Cancer Sister   . Asthma Brother   . Diabetes Maternal Grandfather   . Heart disease Neg Hx     Social History Social History   Tobacco Use  . Smoking status: Never Smoker  . Smokeless tobacco: Never Used  Substance Use Topics  . Alcohol use: No    Alcohol/week: 0.0 standard drinks  . Drug use: No    No Known Allergies  Current Outpatient Medications  Medication Sig Dispense Refill  . ACCU-CHEK FASTCLIX LANCETS MISC TEST  UPTO 4 TIMES DAILY 102 each 3  . ARIPiprazole (ABILIFY) 20 MG tablet Take 20 mg by mouth daily. Sees mental health. Unsure of dosage    . blood glucose meter kit and supplies KIT Dispense based on patient and insurance preference. Use up to four times daily as directed. (FOR ICD-9 250.00, 250.01). 1 each 0  . glucose blood test strip TEST UPTO 4 TIMES DAILY 100 each 3  . metroNIDAZOLE (METROGEL VAGINAL) 0.75 % vaginal gel Place 1 Applicatorful vaginally 2 (two) times daily. 70 g 5  . dicyclomine (BENTYL) 20 MG tablet Take 1 tablet (20 mg total) by mouth 3 (three) times daily before meals. (Patient not taking: Reported on 04/05/2018) 90 tablet 5  . gabapentin (NEURONTIN) 100 MG capsule Take 1 capsule (100 mg total) by mouth at bedtime. (Patient not taking: Reported on 05/18/2018) 90 capsule 3  . ibuprofen (ADVIL,MOTRIN) 800 MG tablet Take 1 tablet (800 mg total) by mouth every 8 (eight) hours as needed. (Patient not taking: Reported on 05/18/2018) 30 tablet 5  . lisinopril (ZESTRIL) 2.5 MG tablet Take 1 tablet (2.5 mg total) by mouth daily. (Patient not taking: Reported on 04/05/2018) 90 tablet 2  . Lurasidone HCl (LATUDA PO) Take by mouth.    Marland Kitchen  metFORMIN (GLUCOPHAGE) 500 MG tablet Take 1 tablet (500 mg total) by mouth 2 (two) times daily with a meal. 60 tablet 5  . ondansetron (ZOFRAN) 4 MG tablet Take 1 tablet (4 mg total) by mouth every 8 (eight) hours as needed for nausea or vomiting. (Patient not taking: Reported on 05/18/2018) 4 tablet 0  . potassium chloride SA (K-DUR,KLOR-CON) 20 MEQ tablet Take 1 tablet (20 mEq total) by mouth 3 (three) times daily. (Patient not taking: Reported on 05/18/2018) 6 tablet 0   No current facility-administered medications for this visit.     Review of Systems Review of Systems Constitutional: negative for fatigue and weight loss Respiratory: negative for cough and wheezing Cardiovascular: negative for chest pain, fatigue and palpitations Gastrointestinal: negative  for abdominal pain and change in bowel habits Genitourinary:positive for pelvic pain Integument/breast: negative for nipple discharge Musculoskeletal:negative for myalgias Neurological: negative for gait problems and tremors Behavioral/Psych: negative for abusive relationship, depression Endocrine: negative for temperature intolerance      Blood pressure 131/83, pulse 94, weight 146 lb 9.6 oz (66.5 kg).  Physical Exam Physical Exam General:   alert  Skin:   no rash or abnormalities  Lungs:   clear to auscultation bilaterally  Heart:   regular rate and rhythm, S1, S2 normal, no murmur, click, rub or gallop  Breasts:   normal without suspicious masses, skin or nipple changes or axillary nodes  Abdomen:  normal findings: no organomegaly, soft, non-tender and no hernia  Pelvis:  External genitalia: normal general appearance Urinary system: urethral meatus normal and bladder without fullness, nontender Vaginal: normal without tenderness, induration or masses Cervix: normal appearance Adnexa: normal bimanual exam Uterus: anteverted and non-tender, normal size    >50% of 15 min visit spent on counseling and coordination of care.   Data Reviewed Ultrasound: US PELVIC COMPLETE WITH TRANSVAGINAL (Accession [id number]) (Order [id number])  Imaging  Date: 05/04/2018 Department: [place] Released By: Adelene Amas, NT Authorizing: Shelly Bombard, MD  Exam Information   Status Exam Begun  Exam Ended   Final [99] 05/04/2018 9:02 AM 05/04/2018 9:27 AM  PACS Images   Show images for US PELVIC COMPLETE WITH TRANSVAGINAL  Study Result   CLINICAL DATA:  Pelvic pain, RIGHT lower quadrant pain, history of RIGHT ovarian cyst  EXAM: TRANSABDOMINAL AND TRANSVAGINAL ULTRASOUND OF PELVIS  TECHNIQUE: Both transabdominal and transvaginal ultrasound examinations of the pelvis were performed. Transabdominal technique was performed for global imaging of  the pelvis including uterus, ovaries, adnexal regions, and pelvic cul-de-sac. It was necessary to proceed with endovaginal exam following the transabdominal exam to visualize the endometrium and ovaries.  COMPARISON:  03/21/2018  FINDINGS: Uterus  Measurements: 9.0 x 4.0 x 6.8 cm = volume: 128 mL. Anteverted. Small anterior wall Caesarean section scar. Otherwise normal morphology without mass.  Endometrium  Thickness: 3 mm.  No endometrial fluid or focal abnormality  Right ovary  Measurements: 3.9 x 2.7 x 2.6 cm = volume: 14.2 mL. Small minimally complicated cyst RIGHT ovary 2.9 x 1.4 x 1.8 cm, decreased from the 3.2 x 2.2 x 2.8 cm on the previous exam. Either several thin septations or small adjacent follicles are identified. No mural nodularity. Blood flow detected within RIGHT ovary on color Doppler imaging.  Left ovary  Measurements: 3.7 x 2.5 x 2.8 cm = volume: 13.5 mL. Normal morphology without mass. Internal blood flow present on color Doppler imaging.  Other findings  No abnormal free fluid.  IMPRESSION: Small  mildly complicated cyst of the RIGHT ovary, decreased in size since previous study.  The lesion may contain several tiny thin septations or these could represent small adjacent follicles.  Follow-up ultrasound recommended in 1-2 months to ensure continued regression and exclude cystic ovarian neoplasm.   Electronically Signed   By: Lavonia Dana M.D.   On: 05/04/2018 10:58    Assessment     1. Cyst of right ovary - resolving on ultrasound  2. Pelvic pain - Ibuprofen prn    Plan    No orders of the defined types were placed in this encounter.  No orders of the defined types were placed in this encounter.   Shelly Bombard MD 05-18-2018

## 2018-05-22 IMAGING — CT CT ANGIO CHEST
2 of 6 series · 18 of 36 positions shown · IV contrast (Omni 300)
Comparison: None.

CLINICAL DATA: Syncope and ECG abnormalities

EXAM:
CT ANGIOGRAPHY CHEST WITH CONTRAST
TECHNIQUE: Multidetector CT imaging of the chest was performed using the
standard protocol during bolus administration of intravenous
contrast. Multiplanar CT image reconstructions and MIPs were
obtained to evaluate the vascular anatomy.
CONTRAST:  100 mL Isovue 370 IV at 4 milliliters/second

[Series 6: pe thins · axial · 0.60mm/px · z∈[-296,-72]mm · 17 of 254 slices shown]
[im 15/254  lung]
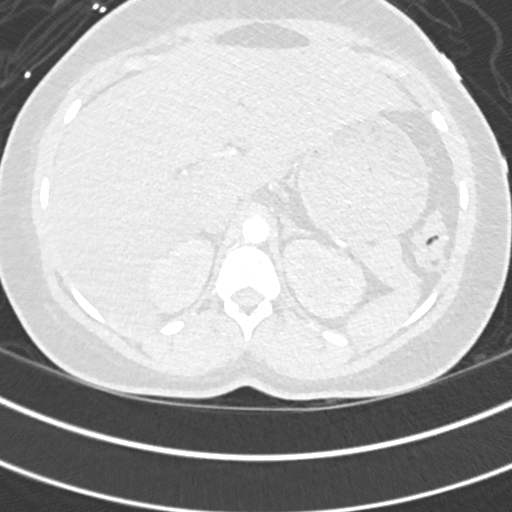
[im 29/254  mediastinal]
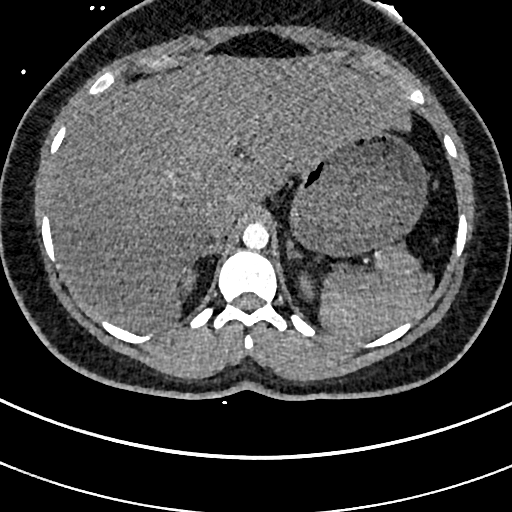
[im 43/254  lung]
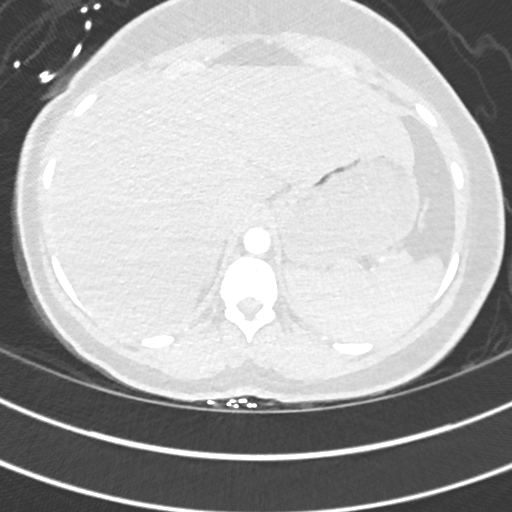
[im 57/254  mediastinal]
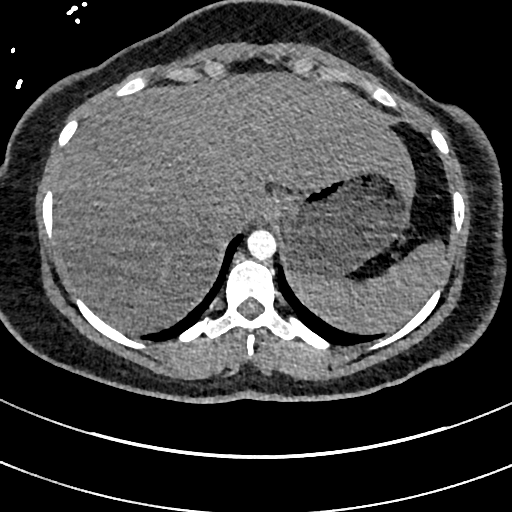
[im 71/254  lung]
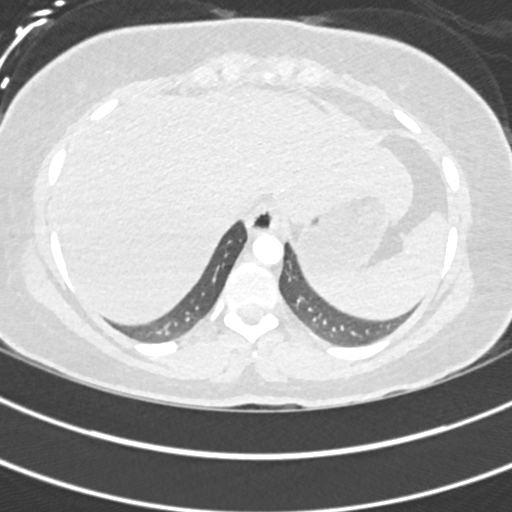
[im 85/254  mediastinal]
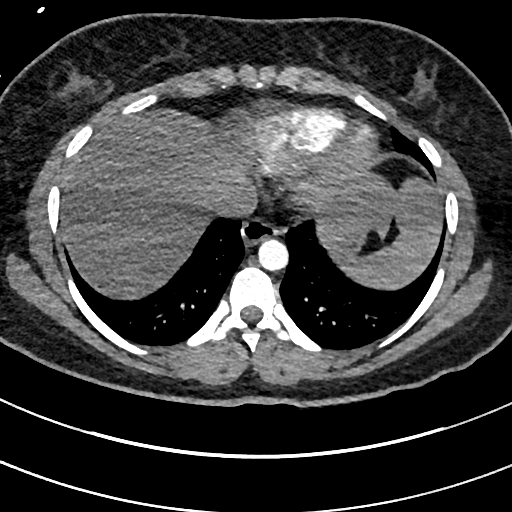
[im 99/254  lung]
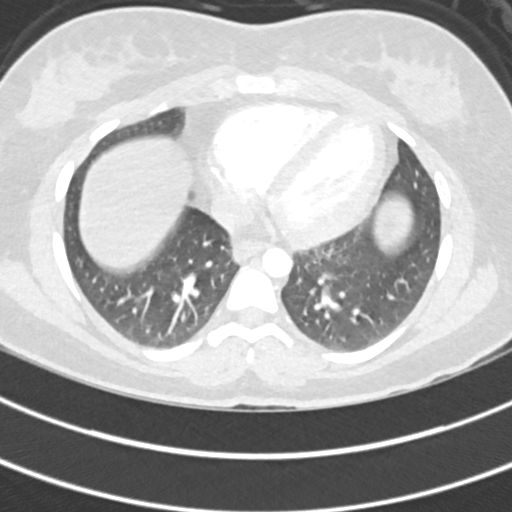
[im 113/254  mediastinal]
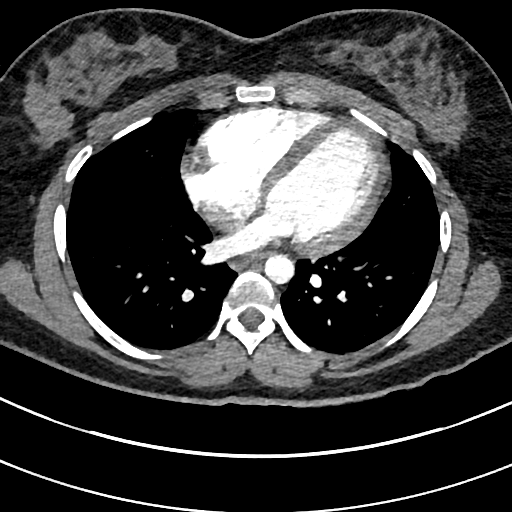
[im 127/254  lung]
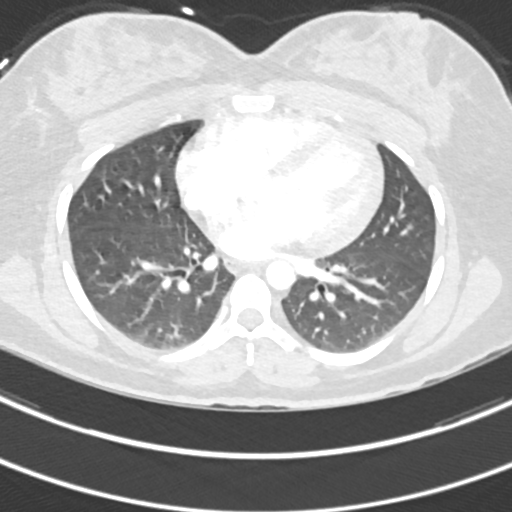
[im 141/254  mediastinal]
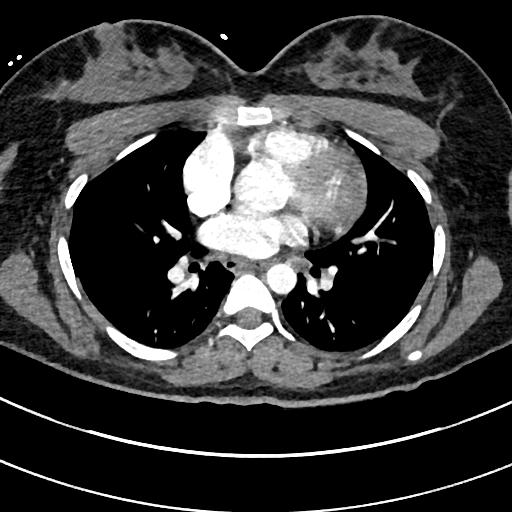
[im 155/254  lung]
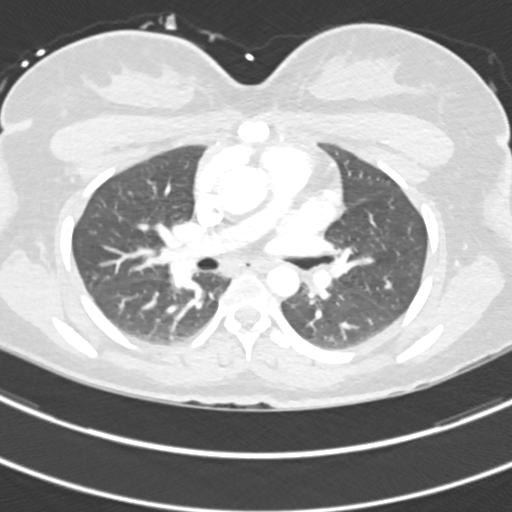
[im 169/254  mediastinal]
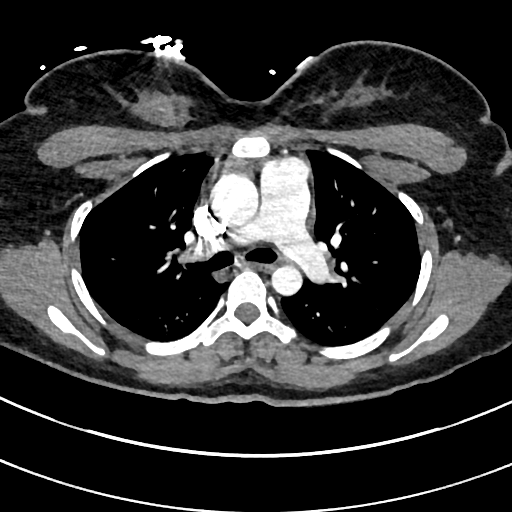
[im 183/254  lung]
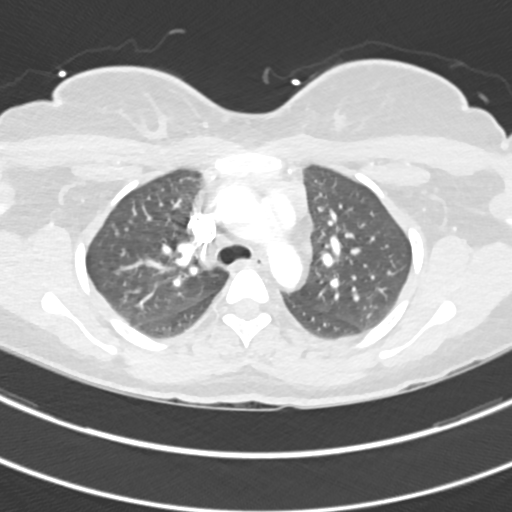
[im 197/254  mediastinal]
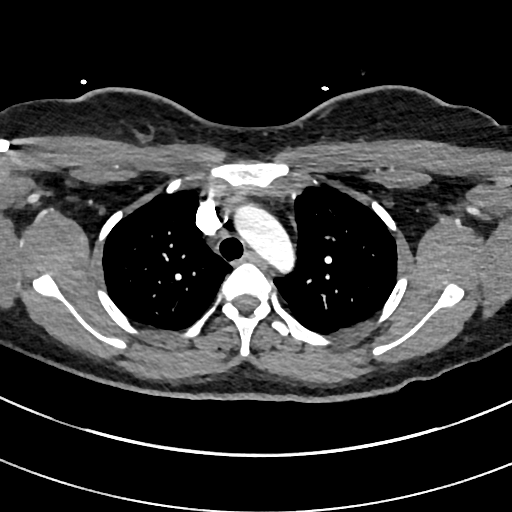
[im 211/254  lung]
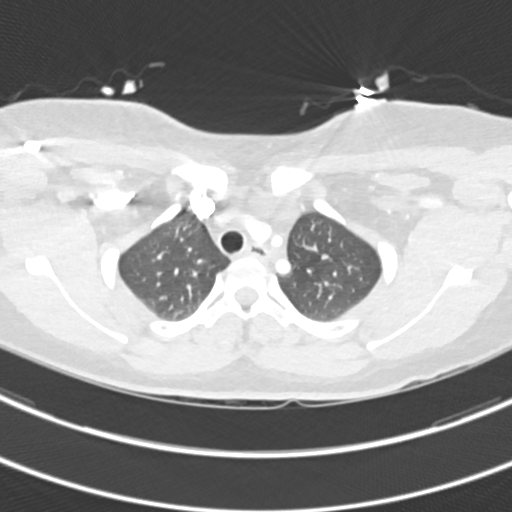
[im 225/254  mediastinal]
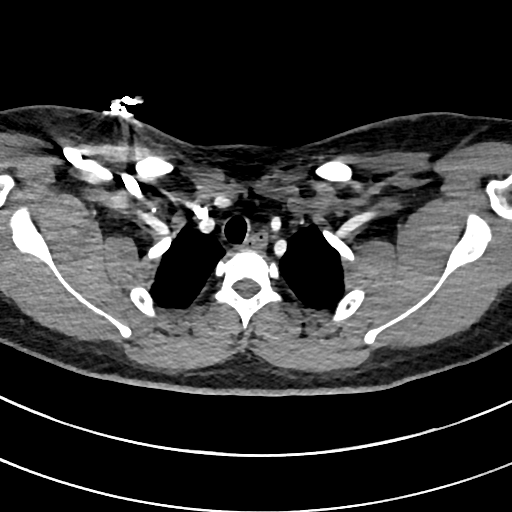
[im 239/254  lung]
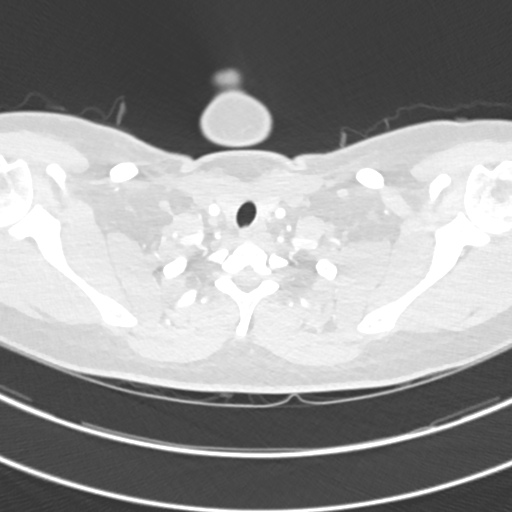

[Series 7: pe 2mm cor · coronal · 0.50mm/px · 1 of 113 slices shown]
[im 57/113  mediastinal]
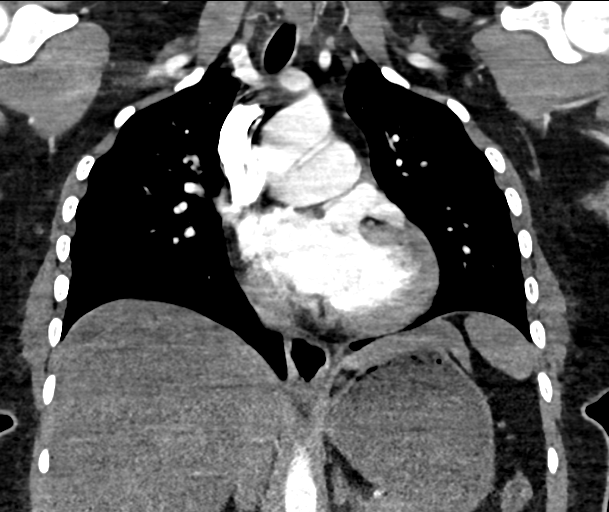

[18 of 36 positions shown; findings below may reference images not displayed]

FINDINGS: Cardiovascular: There is satisfactory opacification of the pulmonary
arteries to the segmental level. There is no pulmonary embolus. The
main pulmonary artery is within normal limits for size, measuring
2.6 cm at the bifurcation. There is no CT evidence of acute right
heart strain. The visualized aorta is normal. There is a normal
3-vessel arch branching pattern. Heart size is normal, without
pericardial effusion.

Mediastinum/Nodes: No mediastinal, hilar or axillary
lymphadenopathy. The visualized thyroid and thoracic esophageal
course are unremarkable.

Lungs/Pleura: No pulmonary nodules or masses. No pleural effusion or
pneumothorax. No focal airspace consolidation. No focal pleural
abnormality.

Upper Abdomen: Contrast bolus timing is not optimized for evaluation
of the abdominal organs. Within this limitation, the visualized
organs of the upper abdomen are normal.

Musculoskeletal: No chest wall abnormality. No acute or significant
osseous findings.

Review of the MIP images confirms the above findings.
IMPRESSION: No pulmonary embolus or other acute abnormality of the chest.

## 2018-05-28 ENCOUNTER — Ambulatory Visit (INDEPENDENT_AMBULATORY_CARE_PROVIDER_SITE_OTHER): Payer: Medicaid Other

## 2018-05-28 VITALS — BP 117/77 | HR 67 | Ht 62.0 in | Wt 147.0 lb

## 2018-05-28 DIAGNOSIS — Z3202 Encounter for pregnancy test, result negative: Secondary | ICD-10-CM

## 2018-05-28 DIAGNOSIS — Z30013 Encounter for initial prescription of injectable contraceptive: Secondary | ICD-10-CM | POA: Diagnosis not present

## 2018-05-28 LAB — POCT URINE PREGNANCY: Preg Test, Ur: NEGATIVE

## 2018-05-28 MED ORDER — MEDROXYPROGESTERONE ACETATE 150 MG/ML IM SUSP
150.0000 mg | Freq: Once | INTRAMUSCULAR | Status: AC
  Administered 2018-05-28: 150 mg via INTRAMUSCULAR

## 2018-05-28 NOTE — Progress Notes (Signed)
Presents for DEPO, given in RUOQ, tolerated well.  Next DEPO May 12-26, 2020.  Administrations This Visit    medroxyPROGESTERone (DEPO-PROVERA) injection 150 mg    Admin Date 05/28/2018 Action Given Dose 150 mg Route Intramuscular Administered By Tamela Oddi, RMA

## 2018-05-28 NOTE — Addendum Note (Signed)
Addended by: Tamela Oddi on: 05/28/2018 09:40 AM   Modules accepted: Level of Service

## 2018-06-04 ENCOUNTER — Ambulatory Visit (HOSPITAL_COMMUNITY)
Admission: RE | Admit: 2018-06-04 | Discharge: 2018-06-04 | Disposition: A | Discharge status: Home / Self Care | Payer: Medicaid Other | Source: Ambulatory Visit | Attending: Obstetrics | Admitting: Obstetrics

## 2018-06-04 DIAGNOSIS — N83201 Unspecified ovarian cyst, right side: Secondary | ICD-10-CM | POA: Diagnosis present

## 2018-06-05 ENCOUNTER — Other Ambulatory Visit: Payer: Self-pay | Admitting: Obstetrics

## 2018-06-05 DIAGNOSIS — N83201 Unspecified ovarian cyst, right side: Secondary | ICD-10-CM

## 2018-06-15 ENCOUNTER — Ambulatory Visit (HOSPITAL_COMMUNITY): Payer: Medicaid Other

## 2018-06-19 ENCOUNTER — Telehealth: Payer: Self-pay

## 2018-06-19 ENCOUNTER — Other Ambulatory Visit: Payer: Self-pay | Admitting: Obstetrics

## 2018-06-19 DIAGNOSIS — N83201 Unspecified ovarian cyst, right side: Secondary | ICD-10-CM

## 2018-06-19 NOTE — Telephone Encounter (Signed)
Returned call, pt requesting provider to call with U/S results. Routed to provider for review.

## 2018-06-28 ENCOUNTER — Ambulatory Visit: Payer: Self-pay | Admitting: Obstetrics

## 2018-07-05 ENCOUNTER — Other Ambulatory Visit: Payer: Self-pay

## 2018-07-05 ENCOUNTER — Ambulatory Visit (INDEPENDENT_AMBULATORY_CARE_PROVIDER_SITE_OTHER): Payer: Medicaid Other | Admitting: Obstetrics

## 2018-07-05 ENCOUNTER — Ambulatory Visit (INDEPENDENT_AMBULATORY_CARE_PROVIDER_SITE_OTHER): Payer: Medicaid Other | Admitting: Family Medicine

## 2018-07-05 ENCOUNTER — Encounter: Payer: Self-pay | Admitting: Obstetrics

## 2018-07-05 DIAGNOSIS — N83201 Unspecified ovarian cyst, right side: Secondary | ICD-10-CM

## 2018-07-05 DIAGNOSIS — R609 Edema, unspecified: Secondary | ICD-10-CM

## 2018-07-05 DIAGNOSIS — R102 Pelvic and perineal pain: Secondary | ICD-10-CM | POA: Diagnosis not present

## 2018-07-05 DIAGNOSIS — I10 Essential (primary) hypertension: Secondary | ICD-10-CM | POA: Diagnosis not present

## 2018-07-05 MED ORDER — HYDROCHLOROTHIAZIDE 12.5 MG PO TABS: 12.5000 mg | ORAL_TABLET | Freq: Every day | ORAL | 3 refills | Status: DC

## 2018-07-05 MED ORDER — LISINOPRIL 2.5 MG PO TABS: 2.5000 mg | ORAL_TABLET | Freq: Every day | ORAL | 2 refills | Status: DC

## 2018-07-05 NOTE — Progress Notes (Signed)
Televisit f/u after u/s d/t RLQ pelvic pain. Pt still experiencing pain.

## 2018-07-05 NOTE — Progress Notes (Signed)
  Patient [facility]  Virtual Visit via Telephone Note  I connected with Stephanie Mack on 07/05/18 at  1:20 PM EDT by telephone and verified that I am speaking with the correct person using two identifiers.   I discussed the limitations, risks, security and privacy concerns of performing an evaluation and management service by telephone and the availability of in person appointments. I also discussed with the patient that there may be a patient responsible charge related to this service. The patient expressed understanding and agreed to proceed.   History of Present Illness: Patient with a history of  has a past medical history of  Type 2 . Currently taking metformin 500 mg BID. Her last A1C was 5.7 on 12/28/2017. She denies side effects of medications. She states that she checks her CBG intermittently. Her last reading was 187. Does not state that it was fasting. She reports having swelling of her      Observations/Objective:   Assessment and Plan:   Follow Up Instructions:    I discussed the assessment and treatment plan with the patient. The patient was provided an opportunity to ask questions and all were answered. The patient agreed with the plan and demonstrated an understanding of the instructions.   The patient was advised to call back or seek an in-person evaluation if the symptoms worsen or if the condition fails to improve as anticipated.  I provided 15 minutes of non-face-to-face time during this encounter.  Ms. Stephanie L. Nathaneil Canary, FNP-BC [facility] [address] [place], [place] [address] [phone]

## 2018-07-05 NOTE — Progress Notes (Addendum)
Patient ID: Stephanie Mack, female   DOB: January 08, 1991, 28 y.o.   MRN: [id number]  TELEHEALTH VIRTUAL GYNECOLOGY VISIT ENCOUNTER NOTE  I connected with [name] at 09:30 on 07-05-2018 by telephone at home and verified that I am speaking to the correct person.  I informed her that this was a TELEVISIT and the limitations and risks thereof.  She expressed an understanding and agreed to proceed.  HPI Stephanie Mack is a 28 y.o. female.  History of pelvic pain from small resolving right ovarian cyst. HPI  Past Medical History:  Diagnosis Date  . ADHD (attention deficit hyperactivity disorder)   . Anemia   . Anxiety   . Bipolar 1 disorder ([place])   . Chlamydia 05-31-10  . Chronic constipation   . Depression    depression  . GERD (gastroesophageal reflux disease)    with pregnancy  . Gestational diabetes    current pregnancy  . Gonorrhea contact, treated   . Headache   . Infection    UTI  . Pregnancy induced hypertension    previous pregnancy  . Pseudocyesis 2013   Seen in [facility] for percieved FM, abd distension. Normal exam.   . Sickle cell trait ([place])   . Type 2 diabetes mellitus ([place]) 02/13/2017    Past Surgical History:  Procedure Laterality Date  . CESAREAN SECTION N/A 09/01/2012   Procedure:  Primary cesarean section with delivery of baby girl at 45.  Apgars 1/1.  ;  Surgeon: Frederico Hamman, MD;  Location: [facility];  Service: Obstetrics;  Laterality: N/A;  . CESAREAN SECTION N/A 01/07/2015   Procedure: REPEAT CESAREAN SECTION;  Surgeon: Shelly Bombard, MD;  Location: [facility];  Service: Obstetrics;  Laterality: N/A;  . NASAL SEPTUM SURGERY    . WISDOM TOOTH EXTRACTION      Family History  Problem Relation Age of Onset  . Kidney disease Mother   . Hypertension Father   . Cancer Sister   . Asthma Brother   . Diabetes Maternal Grandfather   . Heart disease Neg Hx     Social History Social History   Tobacco Use  . Smoking status: Never Smoker  . Smokeless  tobacco: Never Used  Substance Use Topics  . Alcohol use: No    Alcohol/week: 0.0 standard drinks  . Drug use: No    No Known Allergies  Current Outpatient Medications  Medication Sig Dispense Refill  . ACCU-CHEK FASTCLIX LANCETS MISC TEST UPTO 4 TIMES DAILY 102 each 3  . ARIPiprazole (ABILIFY) 20 MG tablet Take 20 mg by mouth daily. Sees mental health. Unsure of dosage    . blood glucose meter kit and supplies KIT Dispense based on patient and insurance preference. Use up to four times daily as directed. (FOR ICD-9 250.00, 250.01). 1 each 0  . glucose blood test strip TEST UPTO 4 TIMES DAILY 100 each 3  . ibuprofen (ADVIL,MOTRIN) 800 MG tablet Take 1 tablet (800 mg total) by mouth every 8 (eight) hours as needed. 30 tablet 5  . dicyclomine (BENTYL) 20 MG tablet Take 1 tablet (20 mg total) by mouth 3 (three) times daily before meals. (Patient not taking: Reported on 04/05/2018) 90 tablet 5  . gabapentin (NEURONTIN) 100 MG capsule Take 1 capsule (100 mg total) by mouth at bedtime. (Patient not taking: Reported on 05/18/2018) 90 capsule 3  . lisinopril (ZESTRIL) 2.5 MG tablet Take 1 tablet (2.5 mg total) by mouth daily. (Patient not taking: Reported on 04/05/2018) 90 tablet 2  .  Lurasidone HCl (LATUDA PO) Take by mouth.    . metFORMIN (GLUCOPHAGE) 500 MG tablet Take 1 tablet (500 mg total) by mouth 2 (two) times daily with a meal. 60 tablet 5  . metroNIDAZOLE (METROGEL VAGINAL) 0.75 % vaginal gel Place 1 Applicatorful vaginally 2 (two) times daily. (Patient not taking: Reported on 07/05/2018) 70 g 5  . ondansetron (ZOFRAN) 4 MG tablet Take 1 tablet (4 mg total) by mouth every 8 (eight) hours as needed for nausea or vomiting. (Patient not taking: Reported on 05/18/2018) 4 tablet 0  . potassium chloride SA (K-DUR,KLOR-CON) 20 MEQ tablet Take 1 tablet (20 mEq total) by mouth 3 (three) times daily. (Patient not taking: Reported on 05/18/2018) 6 tablet 0   No current facility-administered medications for  this visit.     Review of Systems Review of Systems Constitutional: negative for fatigue and weight loss Respiratory: negative for cough and wheezing Cardiovascular: negative for chest pain, fatigue and palpitations Gastrointestinal: negative for abdominal pain and change in bowel habits Genitourinary:POSITIVE for pelvic pain Integument/breast: negative for nipple discharge Musculoskeletal:negative for myalgias Neurological: negative for gait problems and tremors Behavioral/Psych: negative for abusive relationship, depression Endocrine: negative for temperature intolerance      There were no vitals taken for this visit.  Physical Exam Physical Exam:  Deferred    Data Reviewed Ultrasound: US PELVIC COMPLETE WITH TRANSVAGINAL (Accession [id number]) (Order [id number])  Imaging  Date: 06/04/2018 Department: [facility] Released By: Adelene Amas, NT Authorizing: Shelly Bombard, MD  Exam Information   Status Exam Begun  Exam Ended   Final [99] 06/04/2018 11:14 AM 06/04/2018 11:50 AM  PACS Images   Show images for US PELVIC COMPLETE WITH TRANSVAGINAL  Study Result   CLINICAL DATA:  RIGHT ovarian cyst, RIGHT lower quadrant pain  EXAM: TRANSABDOMINAL AND TRANSVAGINAL ULTRASOUND OF PELVIS  TECHNIQUE: Both transabdominal and transvaginal ultrasound examinations of the pelvis were performed. Transabdominal technique was performed for global imaging of the pelvis including uterus, ovaries, adnexal regions, and pelvic cul-de-sac. It was necessary to proceed with endovaginal exam following the transabdominal exam to visualize the endometrium, and to characterize a RIGHT ovarian lesion.  COMPARISON:  05/04/2018  FINDINGS: Uterus  Measurements: 9.8 x 4.3 x 6.6 cm = volume: 144 mL. Normal morphology. Mildly heterogeneous myometrial echogenicity. No discrete mass  Endometrium  Thickness: 3 mm, normal. No endometrial fluid or focal  abnormality. Several tiny some endometrial cysts are noted.  Right ovary  Measurements: 4.0 x 2.3 x 2.5 cm = volume: 12.3 mL. Previously identified cyst now measures 23 x 11 x 15 mm, previously 2.9 x 1.4 x 1.8 cm. The cyst contains peripheral mixed echogenicity material which questionably demonstrates internal blood flow on color Doppler imaging versus blood within the adjacent cyst wall.  Left ovary  Measurements: 3.9 x 2.2 x 2.7 cm = volume: 12.2 mL. Normal morphology without mass  Other findings  No free pelvic fluid or other adnexal masses.  IMPRESSION: Continued decrease in size of a complicated cyst within RIGHT ovary now 2.3 x 1.1 x 1.5 cm.  Lesion contains peripheral mixed echogenic material which may represent resolving clot, though questionably demonstrating blood flow on color Doppler imaging.  Continued sonographic follow-up recommended in 4-6 weeks to ensure resolution.   Electronically Signed   By: Lavonia Dana M.D.   On: 06/04/2018 12:48    Assessment     1. Right ovarian cyst, resolving - repeat ultrasound in 6-8 weeks  2. Pelvic pain -  clinically stable - Ibuprofen prn    Plan    Follow up in 6-8 weeks  I spent 10 minutes of non-face-to-face time with this encounter  No orders of the defined types were placed in this encounter.  No orders of the defined types were placed in this encounter.   Shelly Bombard MD 07-05-2018

## 2018-07-16 ENCOUNTER — Ambulatory Visit (HOSPITAL_COMMUNITY): Admission: RE | Admit: 2018-07-16 | Payer: Medicaid Other | Source: Ambulatory Visit

## 2018-07-17 ENCOUNTER — Other Ambulatory Visit: Payer: Self-pay

## 2018-07-17 ENCOUNTER — Ambulatory Visit (HOSPITAL_COMMUNITY)
Admission: RE | Admit: 2018-07-17 | Discharge: 2018-07-17 | Disposition: A | Discharge status: Home / Self Care | Payer: Medicaid Other | Source: Ambulatory Visit | Attending: Obstetrics | Admitting: Obstetrics

## 2018-07-17 ENCOUNTER — Other Ambulatory Visit: Payer: Self-pay | Admitting: Obstetrics

## 2018-07-17 DIAGNOSIS — N83201 Unspecified ovarian cyst, right side: Secondary | ICD-10-CM

## 2018-07-18 ENCOUNTER — Other Ambulatory Visit: Payer: Self-pay | Admitting: Obstetrics

## 2018-07-18 DIAGNOSIS — B9689 Other specified bacterial agents as the cause of diseases classified elsewhere: Secondary | ICD-10-CM

## 2018-07-18 DIAGNOSIS — N760 Acute vaginitis: Principal | ICD-10-CM

## 2018-07-18 MED ORDER — METRONIDAZOLE 0.75 % VA GEL: 1.0000 | Freq: Two times a day (BID) | VAGINAL | 2 refills | Status: DC

## 2018-07-26 ENCOUNTER — Ambulatory Visit (INDEPENDENT_AMBULATORY_CARE_PROVIDER_SITE_OTHER): Payer: Medicaid Other | Admitting: Family Medicine

## 2018-07-26 ENCOUNTER — Other Ambulatory Visit: Payer: Self-pay

## 2018-07-26 ENCOUNTER — Encounter: Payer: Self-pay | Admitting: Family Medicine

## 2018-07-26 VITALS — BP 127/71 | HR 88 | Temp 98.6°F | Resp 14 | Ht 61.0 in | Wt 148.0 lb

## 2018-07-26 DIAGNOSIS — R112 Nausea with vomiting, unspecified: Secondary | ICD-10-CM | POA: Diagnosis not present

## 2018-07-26 DIAGNOSIS — E119 Type 2 diabetes mellitus without complications: Secondary | ICD-10-CM

## 2018-07-26 LAB — POCT URINALYSIS DIPSTICK
Bilirubin, UA: NEGATIVE
Glucose, UA: NEGATIVE
Ketones, UA: NEGATIVE
Leukocytes, UA: NEGATIVE
Nitrite, UA: NEGATIVE
Protein, UA: POSITIVE — AB
Spec Grav, UA: 1.025 (ref 1.010–1.025)
Urobilinogen, UA: 0.2 E.U./dL
pH, UA: 6 (ref 5.0–8.0)

## 2018-07-26 LAB — POCT GLYCOSYLATED HEMOGLOBIN (HGB A1C): Hemoglobin A1C: 6.2 % — AB (ref 4.0–5.6)

## 2018-07-26 MED ORDER — ONDANSETRON 8 MG PO TBDP: 8.0000 mg | ORAL_TABLET | Freq: Three times a day (TID) | ORAL | 0 refills | Status: DC | PRN

## 2018-07-26 NOTE — Patient Instructions (Signed)
Nausea, Adult Nausea is feeling sick to your stomach or feeling that you are about to throw up (vomit). Feeling sick to your stomach is usually not serious, but it may be an early sign of a more serious medical problem. As you feel sicker to your stomach, you may throw up. If you throw up, or if you are not able to drink enough fluids, there is a risk that you may lose too much water in your body (get dehydrated). If you lose too much water in your body, you may:  Feel tired.  Feel thirsty.  Have a dry mouth.  Have cracked lips.  Go pee (urinate) less often. Older adults and people who have other diseases or a weak body defense system (immune system) have a higher risk of losing too much water in the body. The main goals of treating this condition are:  To relieve your nausea.  To ensure your nausea occurs less often.  To prevent throwing up and losing too much fluid. Follow these instructions at home: Watch your symptoms for any changes. Tell your doctor about them. Follow these instructions as told by your doctor. Eating and drinking      Take an ORS (oral rehydration solution). This is a drink that is sold at pharmacies and stores.  Drink clear fluids in small amounts as you are able. These include: ? Water. ? Ice chips. ? Fruit juice that has water added (diluted fruit juice). ? Low-calorie sports drinks.  Eat bland, easy-to-digest foods in small amounts as you are able, such as: ? Bananas. ? Applesauce. ? Rice. ? Low-fat (lean) meats. ? Toast. ? Crackers.  Avoid drinking fluids that have a lot of sugar or caffeine in them. This includes energy drinks, sports drinks, and soda.  Avoid alcohol.  Avoid spicy or fatty foods. General instructions  Take over-the-counter and prescription medicines only as told by your doctor.  Rest at home while you get better.  Drink enough fluid to keep your pee (urine) pale yellow.  Take slow and deep breaths when you feel  sick to your stomach.  Avoid food or things that have strong smells.  Wash your hands often with soap and water. If you cannot use soap and water, use hand sanitizer.  Make sure that all people in your home wash their hands well and often.  Keep all follow-up visits as told by your doctor. This is important. Contact a doctor if:  You feel sicker to your stomach.  You feel sick to your stomach for more than 2 days.  You throw up.  You are not able to drink fluids without throwing up.  You have new symptoms.  You have a fever.  You have a headache.  You have muscle cramps.  You have a rash.  You have pain while peeing.  You feel light-headed or dizzy. Get help right away if:  You have pain in your chest, neck, arm, or jaw.  You feel very weak or you pass out (faint).  You have throw up that is bright red or looks like coffee grounds.  You have bloody or black poop (stools) or poop that looks like tar.  You have a very bad headache, a stiff neck, or both.  You have very bad pain, cramping, or bloating in your belly (abdomen).  You have trouble breathing or you are breathing very quickly.  Your heart is beating very quickly.  Your skin feels cold and clammy.  You feel confused.    You have signs of losing too much water in your body, such as: ? Dark pee, very little pee, or no pee. ? Cracked lips. ? Dry mouth. ? Sunken eyes. ? Sleepiness. ? Weakness. These symptoms may be an emergency. Do not wait to see if the symptoms will go away. Get medical help right away. Call your local emergency services (911 in the [place] Do not drive yourself to the hospital. Summary  Nausea is feeling sick to your stomach or feeling that you are about to throw up (vomit).  If you throw up, or if you are not able to drink enough fluids, there is a risk that you may lose too much water in your body (get dehydrated).  Eat and drink what your doctor tells you. Take  over-the-counter and prescription medicines only as told by your doctor.  Contact a doctor right away if your symptoms get worse or you have new symptoms.  Keep all follow-up visits as told by your doctor. This is important. This information is not intended to replace advice given to you by your health care provider. Make sure you discuss any questions you have with your health care provider. Document Released: 03/10/2011 Document Revised: 08/29/2017 Document Reviewed: 08/29/2017 Elsevier Interactive Patient Education  2019 [employer]  

## 2018-07-26 NOTE — Progress Notes (Signed)
Patient [facility]   Progress Note: Sick Visit Provider: Lanae Boast, FNP  SUBJECTIVE:   Stephanie Mack is a 28 y.o. female who  has a past medical history of ADHD (attention deficit hyperactivity disorder), Anemia, Anxiety, Bipolar 1 disorder ([place]), Chlamydia (05-31-10), Chronic constipation, Depression, GERD (gastroesophageal reflux disease), Gestational diabetes, Gonorrhea contact, treated, Headache, Infection, Pregnancy induced hypertension, Pseudocyesis (2013), Sickle cell trait ([place]), and Type 2 diabetes mellitus ([place]) (02/13/2017).. 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 She started with nausea, vomiting and diarrhea x 2 days prior to this visit.  Review of Systems  Constitutional: Negative.   HENT: Negative.   Eyes: Negative.   Respiratory: Negative.   Cardiovascular: Negative.   Gastrointestinal: Positive for nausea and vomiting.  Genitourinary: Negative.   Musculoskeletal: Negative.   Skin: Negative.   Neurological: Negative.   Psychiatric/Behavioral: Negative.      OBJECTIVE: BP 127/71 (BP Location: Right Arm, Patient Position: Sitting, Cuff Size: Normal)   Pulse 88   Temp 98.6 F (37 C) (Oral)   Resp 14   Ht 5\' 1"  (1.549 m)   Wt 148 lb (67.1 kg)   SpO2 100%   BMI 27.96 kg/m   Wt Readings from Last 3 Encounters:  07/26/18 148 lb (67.1 kg)  05/28/18 147 lb (66.7 kg)  05/18/18 146 lb 9.6 oz (66.5 kg)     Physical Exam Vitals signs and nursing note reviewed.  Constitutional:      General: She is not in acute distress.    Appearance: Normal appearance.  HENT:     Head: Normocephalic and atraumatic.  Eyes:     Extraocular Movements: Extraocular movements intact.     Conjunctiva/sclera: Conjunctivae normal.     Pupils: Pupils are equal, round, and reactive to light.  Cardiovascular:     Rate  and Rhythm: Normal rate and regular rhythm.     Heart sounds: No murmur.  Pulmonary:     Effort: Pulmonary effort is normal.     Breath sounds: Normal breath sounds.  Abdominal:     General: Bowel sounds are normal.     Palpations: Abdomen is soft.  Musculoskeletal: Normal range of motion.  Skin:    General: Skin is warm and dry.  Neurological:     Mental Status: She is alert and oriented to person, place, and time.  Psychiatric:        Mood and Affect: Mood normal.        Behavior: Behavior normal.        Thought Content: Thought content normal.        Judgment: Judgment normal.     ASSESSMENT/PLAN:   1. Type 2 diabetes mellitus without complication, without long-term current use of insulin No medication changes warranted at the present time.   - HgB A1c - Urinalysis Dipstick - Comprehensive metabolic panel  2. Nausea and vomiting, intractability of vomiting not specified, unspecified vomiting type Continue with increasing fluids and checking blood sugars. Information given on being sick while diabetic.  - ondansetron (ZOFRAN ODT) 8 MG disintegrating tablet; Take 1 tablet (8 mg total) by mouth every 8 (eight) hours as needed for nausea or vomiting. (Patient not taking: Reported on 08/16/2018)  Dispense: 20 tablet; Refill: 0         The patient was given clear instructions to go to  ER or return to medical center if symptoms do not improve, worsen or new problems develop. The patient verbalized understanding and agreed with plan of care.   Ms. Doug Sou. Nathaneil Canary, FNP-BC Patient [facility] [address] [place],  [address] [phone]     This note has been created with Dragon speech recognition software and smart phrase technology. Any transcriptional errors are unintentional.

## 2018-07-27 ENCOUNTER — Telehealth: Payer: Self-pay

## 2018-07-27 ENCOUNTER — Telehealth: Payer: Self-pay | Admitting: Family Medicine

## 2018-07-27 LAB — COMPREHENSIVE METABOLIC PANEL
ALT: 10 IU/L (ref 0–32)
AST: 13 IU/L (ref 0–40)
Albumin/Globulin Ratio: 1.5 (ref 1.2–2.2)
Albumin: 4.3 g/dL (ref 3.9–5.0)
Alkaline Phosphatase: 66 IU/L (ref 39–117)
BUN/Creatinine Ratio: 20 (ref 9–23)
BUN: 11 mg/dL (ref 6–20)
Bilirubin Total: 0.5 mg/dL (ref 0.0–1.2)
CO2: 18 mmol/L — ABNORMAL LOW (ref 20–29)
Calcium: 9.2 mg/dL (ref 8.7–10.2)
Chloride: 99 mmol/L (ref 96–106)
Creatinine, Ser: 0.56 mg/dL — ABNORMAL LOW (ref 0.57–1.00)
GFR calc Af Amer: 150 mL/min/{1.73_m2} (ref >59)
GFR calc non Af Amer: 130 mL/min/{1.73_m2} (ref >59)
Globulin, Total: 2.9 g/dL (ref 1.5–4.5)
Glucose: 134 mg/dL — ABNORMAL HIGH (ref 65–99)
Potassium: 3.6 mmol/L (ref 3.5–5.2)
Sodium: 137 mmol/L (ref 134–144)
Total Protein: 7.2 g/dL (ref 6.0–8.5)

## 2018-07-27 NOTE — Telephone Encounter (Signed)
Called spoke with patient. Advised that labs are stable without significant change and that no medications are changed at this time. Patient verbalized understanding. Thanks!

## 2018-07-27 NOTE — Telephone Encounter (Signed)
-----   Message from Lanae Boast, [place] sent at 07/27/2018  3:26 PM EDT ----- The  labs are stable without significant clinical change.  All other results are normal or within acceptable limits. No Medication changes

## 2018-08-02 ENCOUNTER — Telehealth: Payer: Self-pay | Admitting: *Deleted

## 2018-08-02 NOTE — Telephone Encounter (Signed)
Spoke with pt today.  She states she is having period like bleeding while on Depo.  Pt states she never had bleeding like this before. Last Depo given in February.  Pt states she is also having lower pelvic cramping and burning with urination. Request Rx for urinary symptoms. Pt had recent udip at another office and was within normal limits.  Pt advised message to be sent to provider for recommendations. Please advise on bleeding and ?need for Rx

## 2018-08-07 ENCOUNTER — Telehealth: Payer: Self-pay | Admitting: *Deleted

## 2018-08-07 NOTE — Telephone Encounter (Signed)
Pt called to office stating she is having bleeding with Depo, wants to know if she can get shot early.  Pt is not due for Depo until 08/20/2018. Per Dr Jodi Mourning, pt may schedule appt for Depo sooner than current due date.  LM on pt VM making her aware and to call office to schedule.

## 2018-08-16 ENCOUNTER — Ambulatory Visit (INDEPENDENT_AMBULATORY_CARE_PROVIDER_SITE_OTHER): Payer: Medicaid Other

## 2018-08-16 ENCOUNTER — Other Ambulatory Visit: Payer: Self-pay

## 2018-08-16 DIAGNOSIS — Z3042 Encounter for surveillance of injectable contraceptive: Secondary | ICD-10-CM | POA: Diagnosis not present

## 2018-08-16 DIAGNOSIS — R350 Frequency of micturition: Secondary | ICD-10-CM

## 2018-08-16 MED ORDER — MEDROXYPROGESTERONE ACETATE 150 MG/ML IM SUSP
150.0000 mg | INTRAMUSCULAR | Status: DC
  Administered 2018-08-16 – 2018-11-15 (×2): 150 mg via INTRAMUSCULAR

## 2018-08-16 NOTE — Progress Notes (Addendum)
Pt is in the office for depo injection, administered in [place] and pt tolerated well .Marland KitchenPt complains of urinary frequency along with back and pelvic pain, pt left urine sample for culture. Administrations This Visit    medroxyPROGESTERone (DEPO-PROVERA) injection 150 mg    Admin Date 08/16/2018 Action Given Dose 150 mg Route Intramuscular Administered By Hinton Lovely, RN

## 2018-08-18 ENCOUNTER — Other Ambulatory Visit: Payer: Self-pay | Admitting: Obstetrics

## 2018-08-18 DIAGNOSIS — N3000 Acute cystitis without hematuria: Secondary | ICD-10-CM

## 2018-08-18 LAB — URINE CULTURE

## 2018-08-18 MED ORDER — AMOXICILLIN-POT CLAVULANATE 875-125 MG PO TABS: 1.0000 | ORAL_TABLET | Freq: Two times a day (BID) | ORAL | 0 refills | Status: DC

## 2018-08-21 ENCOUNTER — Ambulatory Visit: Payer: Self-pay

## 2018-09-06 ENCOUNTER — Ambulatory Visit: Payer: Medicaid Other

## 2018-09-06 NOTE — Telephone Encounter (Signed)
Error

## 2018-10-12 ENCOUNTER — Ambulatory Visit: Payer: Medicaid Other

## 2018-10-25 ENCOUNTER — Telehealth: Payer: Self-pay

## 2018-10-25 ENCOUNTER — Ambulatory Visit: Payer: Self-pay | Admitting: Family Medicine

## 2018-10-25 MED ORDER — METRONIDAZOLE 0.75 % VA GEL: 1.0000 | Freq: Two times a day (BID) | VAGINAL | 0 refills | Status: DC

## 2018-10-25 NOTE — Telephone Encounter (Signed)
Returned call, pt reports vaginal d/c and odor, requests refill on metrogel, sent with approval.

## 2018-11-02 ENCOUNTER — Other Ambulatory Visit: Payer: Self-pay | Admitting: Obstetrics

## 2018-11-02 ENCOUNTER — Ambulatory Visit: Payer: Medicaid Other

## 2018-11-02 ENCOUNTER — Ambulatory Visit: Payer: Self-pay | Admitting: Family Medicine

## 2018-11-02 DIAGNOSIS — Z30013 Encounter for initial prescription of injectable contraceptive: Secondary | ICD-10-CM

## 2018-11-08 ENCOUNTER — Ambulatory Visit: Payer: Medicaid Other

## 2018-11-12 ENCOUNTER — Ambulatory Visit: Payer: Self-pay | Admitting: Family Medicine

## 2018-11-14 ENCOUNTER — Ambulatory Visit: Payer: Medicaid Other

## 2018-11-15 ENCOUNTER — Ambulatory Visit (INDEPENDENT_AMBULATORY_CARE_PROVIDER_SITE_OTHER): Payer: Medicaid Other

## 2018-11-15 ENCOUNTER — Other Ambulatory Visit: Payer: Self-pay

## 2018-11-15 DIAGNOSIS — Z3042 Encounter for surveillance of injectable contraceptive: Secondary | ICD-10-CM

## 2018-11-15 NOTE — Progress Notes (Signed)
Pt is in the office for depo injection, administered in [place] and pt tolerated well. Next due 10/29-11/12 .Marland Kitchen Administrations This Visit    medroxyPROGESTERone (DEPO-PROVERA) injection 150 mg    Admin Date 11/15/2018 Action Given Dose 150 mg Route Intramuscular Administered By Hinton Lovely, RN

## 2018-12-11 ENCOUNTER — Other Ambulatory Visit: Payer: Self-pay

## 2018-12-11 ENCOUNTER — Ambulatory Visit (HOSPITAL_COMMUNITY)
Admission: EM | Admit: 2018-12-11 | Discharge: 2018-12-11 | Disposition: A | Discharge status: Home / Self Care | Payer: Medicaid Other | Attending: Family Medicine | Admitting: Family Medicine

## 2018-12-11 ENCOUNTER — Encounter (HOSPITAL_COMMUNITY): Payer: Self-pay | Admitting: Emergency Medicine

## 2018-12-11 DIAGNOSIS — R110 Nausea: Secondary | ICD-10-CM

## 2018-12-11 DIAGNOSIS — Z3202 Encounter for pregnancy test, result negative: Secondary | ICD-10-CM

## 2018-12-11 LAB — POCT PREGNANCY, URINE: Preg Test, Ur: NEGATIVE

## 2018-12-11 NOTE — ED Triage Notes (Signed)
Pt here for pregnancy test; pt unsure when last period was; pt has abd distention; pt sts some nausea; denies taking home pregnancy test

## 2018-12-12 ENCOUNTER — Encounter (HOSPITAL_COMMUNITY): Payer: Self-pay

## 2018-12-12 ENCOUNTER — Encounter (HOSPITAL_COMMUNITY): Payer: Self-pay | Admitting: *Deleted

## 2018-12-12 NOTE — ED Provider Notes (Signed)
[facility]   [id number] 12/11/18 Arrival Time: 1900  ASSESSMENT & PLAN:  1. Nausea without vomiting   2. Negative pregnancy test     UPT negative. Declines serum HCG. Observation. Nausea has resolved. Tolerating normal PO intake. Benign abdominal exam. No STI testing desired.  Recommend: Follow-up Information    Lanae Boast, [place].   Specialty: Family Medicine Why: As needed. Contact information: [place] [id number] [phone]          Reviewed expectations re: course of current medical issues. Questions answered. Outlined signs and symptoms indicating need for more acute intervention. Patient verbalized understanding. After Visit Summary given.   SUBJECTIVE: History from: patient.  Stephanie Mack is a 28 y.o. female who requests a pregnancy test. Unsure of LMP. Irregular. "Maybe a couple of months ago." Mild nausea without vomiting recently; none currently. Occasional "bloated feeling" of abdomen; none currently. No specific aggravating or alleviating factors reported. Associated symptoms: "feel tired sometimes too". She denies arthralgias, belching, chills, constipation, diarrhea, headache, myalgias and sweats. Appetite: normal. PO intake: normal. Ambulatory without assistance. Urinary symptoms: none. Sick contacts: none. Recent travel or camping: none. OTC treatment: none.  No LMP recorded (lmp unknown). "Not sure."  Past Surgical History:  Procedure Laterality Date  . CESAREAN SECTION N/A 09/01/2012   Procedure:  Primary cesarean section with delivery of baby girl at 3.  Apgars 1/1.  ;  Surgeon: Frederico Hamman, MD;  Location: [facility];  Service: Obstetrics;  Laterality: N/A;  . CESAREAN SECTION N/A 01/07/2015   Procedure: REPEAT CESAREAN SECTION;  Surgeon: Shelly Bombard, MD;  Location: [facility];  Service: Obstetrics;  Laterality: N/A;  . NASAL SEPTUM SURGERY    . WISDOM TOOTH EXTRACTION      ROS: As per HPI. All other  systems negative.   OBJECTIVE:  Vitals:   12/11/18 1921  BP: (!) 144/91  Pulse: 80  Resp: 18  Temp: 98.5 F (36.9 C)  TempSrc: Oral  SpO2: 99%    General appearance: alert; no distress Oropharynx: moist Lungs: clear to auscultation bilaterally; unlabored Heart: regular rate and rhythm Abdomen: soft; non-distended; no significant abdominal tenderness; bowel sounds present; no masses or organomegaly; no guarding or rebound tenderness Back: no CVA tenderness Extremities: no edema; symmetrical with no gross deformities Skin: warm; dry Neurologic: normal gait Psychological: alert and cooperative; normal mood and affect  Labs: Results for orders placed or performed during the hospital encounter of 12/11/18  Pregnancy, urine POC  Result Value Ref Range   Preg Test, Ur NEGATIVE NEGATIVE   Labs Reviewed  POC URINE PREG, ED  POCT PREGNANCY, URINE    No Known Allergies                                             Past Medical History:  Diagnosis Date  . ADHD (attention deficit hyperactivity disorder)   . Anemia   . Anxiety   . Bipolar 1 disorder ([place])   . Chlamydia 05-31-10  . Chronic constipation   . Depression    depression  . GERD (gastroesophageal reflux disease)    with pregnancy  . Gestational diabetes    current pregnancy  . Gonorrhea contact, treated   . Headache   . Infection    UTI  . Pregnancy induced hypertension    previous pregnancy  . Pseudocyesis  2013   Seen in [facility]U for percieved FM, abd distension. Normal exam.   . Sickle cell trait ([place])   . Type 2 diabetes mellitus ([place]) 02/13/2017   Social History   Socioeconomic History  . Marital status: Single    Spouse name: Not on file  . Number of children: Not on file  . Years of education: Not on file  . Highest education level: Not on file  Occupational History  . Not on file  Social Needs  . Financial resource strain: Not on file  . Food insecurity    Worry: Not on file    Inability: Not on  file  . Transportation needs    Medical: Not on file    Non-medical: Not on file  Tobacco Use  . Smoking status: Never Smoker  . Smokeless tobacco: Never Used  Substance and Sexual Activity  . Alcohol use: No    Alcohol/week: 0.0 standard drinks  . Drug use: No  . Sexual activity: Yes    Partners: Male    Birth control/protection: Injection    Comment: last intercourse in august 2019  Lifestyle  . Physical activity    Days per week: Not on file    Minutes per session: Not on file  . Stress: Not on file  Relationships  . Social Herbalist on phone: Not on file    Gets together: Not on file    Attends religious service: Not on file    Active member of club or organization: Not on file    Attends meetings of clubs or organizations: Not on file    Relationship status: Not on file  . Intimate partner violence    Fear of current or ex partner: No    Emotionally abused: No    Physically abused: No    Forced sexual activity: No  Other Topics Concern  . Not on file  Social History Narrative   Single, one daughter born 2016          Family History  Problem Relation Age of Onset  . Kidney disease Mother   . Hypertension Father   . Cancer Sister   . Asthma Brother   . Diabetes Maternal Grandfather   . Heart disease Neg Hx      Vanessa Kick, MD 12/12/18 1135

## 2019-01-03 ENCOUNTER — Ambulatory Visit: Payer: Medicaid Other | Admitting: Obstetrics

## 2019-01-20 ENCOUNTER — Emergency Department (HOSPITAL_COMMUNITY)
Admission: EM | Admit: 2019-01-20 | Discharge: 2019-01-20 | Disposition: A | Discharge status: Home / Self Care | Payer: Medicaid Other | Attending: Emergency Medicine | Admitting: Emergency Medicine

## 2019-01-20 ENCOUNTER — Encounter (HOSPITAL_COMMUNITY): Payer: Self-pay

## 2019-01-20 ENCOUNTER — Other Ambulatory Visit: Payer: Self-pay

## 2019-01-20 DIAGNOSIS — Z760 Encounter for issue of repeat prescription: Secondary | ICD-10-CM

## 2019-01-20 DIAGNOSIS — I10 Essential (primary) hypertension: Secondary | ICD-10-CM | POA: Insufficient documentation

## 2019-01-20 DIAGNOSIS — E119 Type 2 diabetes mellitus without complications: Secondary | ICD-10-CM | POA: Insufficient documentation

## 2019-01-20 DIAGNOSIS — Z79899 Other long term (current) drug therapy: Secondary | ICD-10-CM | POA: Diagnosis not present

## 2019-01-20 MED ORDER — PROPRANOLOL HCL 10 MG PO TABS: 10.0000 mg | ORAL_TABLET | Freq: Every day | ORAL | 0 refills | Status: DC

## 2019-01-20 NOTE — Discharge Instructions (Addendum)
Take your blood pressure medication as prescribed.  Follow-up with your doctor as scheduled.

## 2019-01-20 NOTE — ED Triage Notes (Signed)
Patient needs BP medication refill-states cant get MD appt until 10/29, NAD
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
## 2019-01-22 ENCOUNTER — Encounter: Payer: Self-pay | Admitting: Obstetrics and Gynecology

## 2019-01-22 ENCOUNTER — Ambulatory Visit (INDEPENDENT_AMBULATORY_CARE_PROVIDER_SITE_OTHER): Payer: Medicaid Other | Admitting: Obstetrics and Gynecology

## 2019-01-22 ENCOUNTER — Other Ambulatory Visit: Payer: Self-pay

## 2019-01-22 DIAGNOSIS — N921 Excessive and frequent menstruation with irregular cycle: Secondary | ICD-10-CM

## 2019-01-22 NOTE — Progress Notes (Signed)
TELEHEALTH GYNECOLOGY VIRTUAL VIDEO VISIT ENCOUNTER NOTE  Provider location: [facility] [place]   I connected with [name] on 01/22/19 at  4:15 PM EDT by WebEx Video Encounter at home and verified that I am speaking with the correct person using two identifiers.   I discussed the limitations, risks, security and privacy concerns of performing an evaluation and management service virtually and the availability of in person appointments. I also discussed with the patient that there may be a patient responsible charge related to this service. The patient expressed understanding and agreed to proceed.   History:  Stephanie Mack is a 28 y.o. [phone] female being evaluated today for AUB with depo-provera. Patient started depo-provera in 05/2018 and has been amenorrheic since initiation. She reports experiencing some vaginal spotting in early October associated with some cramping pain. She denies any abnormal vaginal discharge, bleeding, pelvic pain or other concerns currently.       Past Medical History:  Diagnosis Date  . ADHD (attention deficit hyperactivity disorder)   . Anemia   . Anxiety   . Bipolar 1 disorder ([place])   . Chlamydia 05-31-10  . Chronic constipation   . Depression    depression  . GERD (gastroesophageal reflux disease)    with pregnancy  . Gestational diabetes    current pregnancy  . Gonorrhea contact, treated   . Headache   . Infection    UTI  . Pregnancy induced hypertension    previous pregnancy  . Pseudocyesis 2013   Seen in MAU for percieved FM, abd distension. Normal exam.   . Sickle cell trait ([place])   . Type 2 diabetes mellitus ([place]) 02/13/2017   Past Surgical History:  Procedure Laterality Date  . CESAREAN SECTION N/A 09/01/2012   Procedure:  Primary cesarean section with delivery of baby girl at 70.  Apgars 1/1.  ;  Surgeon: Frederico Hamman, MD;  Location: [facility];  Service: Obstetrics;  Laterality: N/A;  . CESAREAN  SECTION N/A 01/07/2015   Procedure: REPEAT CESAREAN SECTION;  Surgeon: Shelly Bombard, MD;  Location: [facility];  Service: Obstetrics;  Laterality: N/A;  . NASAL SEPTUM SURGERY    . WISDOM TOOTH EXTRACTION     The following portions of the patient's history were reviewed and updated as appropriate: allergies, current medications, past family history, past medical history, past social history, past surgical history and problem list.   Health Maintenance:  Normal pap and negative HRHPV on 01/2017  Review of Systems:  Pertinent items noted in HPI and remainder of comprehensive ROS otherwise negative.  Physical Exam:   General:  Alert, oriented and cooperative. Patient appears to be in no acute distress.  Mental Status: Normal mood and affect. Normal behavior. Normal judgment and thought content.   Respiratory: Normal respiratory effort, no problems with respiration noted  Rest of physical exam deferred due to type of encounter  Labs and Imaging No results found for this or any previous visit (from the past 336 hour(s)). No results found.     Assessment and Plan:     First episode of vaginal bleeding on depo-provera - Advised patient to take Ibuprofen for cramps - Advised patient to keep a menstrual calendar - Informed patient that some women continue to have a light period while on depo-provera which is not considered abnormal - RTC prn.      I discussed the assessment and treatment plan with the patient. The patient was provided an opportunity to  ask questions and all were answered. The patient agreed with the plan and demonstrated an understanding of the instructions.   The patient was advised to call back or seek an in-person evaluation/go to the ED if the symptoms worsen or if the condition fails to improve as anticipated.  I provided 11 minutes of face-to-face time during this encounter.   Mora Bellman, MD [facility]

## 2019-01-22 NOTE — Progress Notes (Signed)
S/w pt for webex visit. Pt reports that she has been on depo since 05-28-18 and has not had a cycle. Pt reports on Oct 2nd she was cramping and went to the bathroom and saw blood when she wipes. Pt reports occasional cramp since then no more bleeding.

## 2019-02-07 ENCOUNTER — Ambulatory Visit: Payer: Medicaid Other

## 2019-02-14 ENCOUNTER — Ambulatory Visit: Payer: Medicaid Other | Admitting: Obstetrics and Gynecology

## 2019-02-14 ENCOUNTER — Other Ambulatory Visit: Payer: Self-pay

## 2019-02-14 ENCOUNTER — Encounter: Payer: Self-pay | Admitting: Obstetrics and Gynecology

## 2019-02-14 ENCOUNTER — Other Ambulatory Visit (HOSPITAL_COMMUNITY)
Admission: RE | Admit: 2019-02-14 | Discharge: 2019-02-14 | Disposition: A | Discharge status: Home / Self Care | Payer: Medicaid Other | Source: Ambulatory Visit | Attending: Obstetrics and Gynecology | Admitting: Obstetrics and Gynecology

## 2019-02-14 ENCOUNTER — Ambulatory Visit (INDEPENDENT_AMBULATORY_CARE_PROVIDER_SITE_OTHER): Payer: Medicaid Other | Admitting: Obstetrics and Gynecology

## 2019-02-14 VITALS — BP 132/89 | HR 102 | Wt 157.3 lb

## 2019-02-14 DIAGNOSIS — Z113 Encounter for screening for infections with a predominantly sexual mode of transmission: Secondary | ICD-10-CM | POA: Diagnosis present

## 2019-02-14 DIAGNOSIS — Z3042 Encounter for surveillance of injectable contraceptive: Secondary | ICD-10-CM

## 2019-02-14 MED ORDER — MEDROXYPROGESTERONE ACETATE 150 MG/ML IM SUSP
150.0000 mg | Freq: Once | INTRAMUSCULAR | Status: AC
  Administered 2019-02-14: 150 mg via INTRAMUSCULAR

## 2019-02-14 MED ORDER — CEPHALEXIN 500 MG PO CAPS: 500.0000 mg | ORAL_CAPSULE | Freq: Four times a day (QID) | ORAL | 2 refills | Status: DC

## 2019-02-14 NOTE — Progress Notes (Signed)
28 yo P2 here for the evaluation of vaginitis and desire for STI screening. Patient is sexually active using depo-provera for contraception. She reports the presence of a white discharge with lower abdominal pain. She also reports feeling bloated and having issues with constipation. She has a painful boil on her back that her mother tried to drain.   Past Medical History:  Diagnosis Date  . ADHD (attention deficit hyperactivity disorder)   . Anemia   . Anxiety   . Bipolar 1 disorder ([place])   . Chlamydia 05-31-10  . Chronic constipation   . Depression    depression  . GERD (gastroesophageal reflux disease)    with pregnancy  . Gestational diabetes    current pregnancy  . Gonorrhea contact, treated   . Headache   . Infection    UTI  . Pregnancy induced hypertension    previous pregnancy  . Pseudocyesis 2013   Seen in MAU for percieved FM, abd distension. Normal exam.   . Sickle cell trait ([place])   . Type 2 diabetes mellitus ([place]) 02/13/2017   Past Surgical History:  Procedure Laterality Date  . CESAREAN SECTION N/A 09/01/2012   Procedure:  Primary cesarean section with delivery of baby girl at 76.  Apgars 1/1.  ;  Surgeon: Frederico Hamman, MD;  Location: [facility];  Service: Obstetrics;  Laterality: N/A;  . CESAREAN SECTION N/A 01/07/2015   Procedure: REPEAT CESAREAN SECTION;  Surgeon: Shelly Bombard, MD;  Location: [facility];  Service: Obstetrics;  Laterality: N/A;  . NASAL SEPTUM SURGERY    . WISDOM TOOTH EXTRACTION     Family History  Problem Relation Age of Onset  . Kidney disease Mother   . Hypertension Father   . Cancer Sister   . Asthma Brother   . Diabetes Maternal Grandfather   . Heart disease Neg Hx    Social History   Tobacco Use  . Smoking status: Never Smoker  . Smokeless tobacco: Never Used  Substance Use Topics  . Alcohol use: No    Alcohol/week: 0.0 standard drinks  . Drug use: No   ROS See pertinent in HPI  Blood pressure 132/89, pulse (!) 102,  weight 157 lb 4.8 oz (71.4 kg), last menstrual period 01/04/2019.  GENERAL: Well-developed, well-nourished female in no acute distress.  ABDOMEN: Soft, nontender, nondistended. No organomegaly. PELVIC: Normal external female genitalia. Vagina is pink and rugated.  Normal discharge. Normal appearing cervix. Uterus is normal in size. No adnexal mass or tenderness. EXTREMITIES: No cyanosis, clubbing, or edema, 2+ distal pulses. BACK: small boil with dry scap over it, non fluctuant and tender to touch  A/P 28 yo with boil and desire for STI screening - Rx keflex provided - STI screen per patient request - depo-provera today - Advised patient to increase water and fiber intake and to follow up with PCP if bloating and constipation persists

## 2019-02-14 NOTE — Progress Notes (Signed)
Pt is here today for pelvic pain, pt reports this has been going on for 2 months. Depo injection given today in [place] without difficulty, next injection due 1/28-2/11, pt made aware.

## 2019-02-15 LAB — CERVICOVAGINAL ANCILLARY ONLY
Bacterial Vaginitis (gardnerella): POSITIVE — AB
Candida Glabrata: NEGATIVE
Candida Vaginitis: POSITIVE — AB
Chlamydia: NEGATIVE
Comment: NEGATIVE
Comment: NEGATIVE
Comment: NEGATIVE
Comment: NEGATIVE
Comment: NEGATIVE
Comment: NORMAL
Neisseria Gonorrhea: NEGATIVE
Trichomonas: NEGATIVE

## 2019-02-15 LAB — HEPATITIS B SURFACE ANTIGEN: Hepatitis B Surface Ag: NEGATIVE

## 2019-02-15 LAB — HIV ANTIBODY (ROUTINE TESTING W REFLEX): HIV Screen 4th Generation wRfx: NONREACTIVE

## 2019-02-15 LAB — RPR: RPR Ser Ql: NONREACTIVE

## 2019-02-15 LAB — HEPATITIS C ANTIBODY: Hep C Virus Ab: <0.1 s/co ratio (ref 0.0–0.9)

## 2019-02-18 ENCOUNTER — Other Ambulatory Visit: Payer: Self-pay | Admitting: Obstetrics

## 2019-02-18 DIAGNOSIS — N946 Dysmenorrhea, unspecified: Secondary | ICD-10-CM

## 2019-02-18 MED ORDER — METRONIDAZOLE 500 MG PO TABS: 500.0000 mg | ORAL_TABLET | Freq: Two times a day (BID) | ORAL | 0 refills | Status: DC

## 2019-02-18 MED ORDER — FLUCONAZOLE 150 MG PO TABS: 150.0000 mg | ORAL_TABLET | Freq: Once | ORAL | 0 refills | Status: AC

## 2019-02-18 NOTE — Addendum Note (Signed)
Addended by: Mora Bellman on: 02/18/2019 08:23 AM   Modules accepted: Orders

## 2019-02-19 ENCOUNTER — Other Ambulatory Visit: Payer: Self-pay

## 2019-02-19 ENCOUNTER — Telehealth: Payer: Self-pay

## 2019-02-19 DIAGNOSIS — B9689 Other specified bacterial agents as the cause of diseases classified elsewhere: Secondary | ICD-10-CM

## 2019-02-19 DIAGNOSIS — N760 Acute vaginitis: Secondary | ICD-10-CM

## 2019-02-19 MED ORDER — METRONIDAZOLE 0.75 % VA GEL: 1.0000 | Freq: Every day | VAGINAL | 1 refills | Status: DC

## 2019-02-19 NOTE — Telephone Encounter (Signed)
TC tp pt regarding not being able to take tablets for BV Gel Rx sent in.

## 2019-03-09 ENCOUNTER — Encounter (HOSPITAL_COMMUNITY): Payer: Self-pay

## 2019-03-09 ENCOUNTER — Ambulatory Visit (HOSPITAL_COMMUNITY)
Admission: EM | Admit: 2019-03-09 | Discharge: 2019-03-09 | Disposition: A | Discharge status: Home / Self Care | Payer: Medicaid Other | Attending: Family Medicine | Admitting: Family Medicine

## 2019-03-09 ENCOUNTER — Other Ambulatory Visit: Payer: Self-pay

## 2019-03-09 ENCOUNTER — Emergency Department (HOSPITAL_COMMUNITY)
Admission: EM | Admit: 2019-03-09 | Discharge: 2019-03-09 | Disposition: A | Discharge status: Home / Self Care | Payer: Medicaid Other | Attending: Emergency Medicine | Admitting: Emergency Medicine

## 2019-03-09 ENCOUNTER — Encounter (HOSPITAL_COMMUNITY): Payer: Self-pay | Admitting: Emergency Medicine

## 2019-03-09 DIAGNOSIS — R519 Headache, unspecified: Secondary | ICD-10-CM | POA: Diagnosis present

## 2019-03-09 DIAGNOSIS — K5900 Constipation, unspecified: Secondary | ICD-10-CM | POA: Insufficient documentation

## 2019-03-09 DIAGNOSIS — Z5321 Procedure and treatment not carried out due to patient leaving prior to being seen by health care provider: Secondary | ICD-10-CM | POA: Insufficient documentation

## 2019-03-09 DIAGNOSIS — R1084 Generalized abdominal pain: Secondary | ICD-10-CM | POA: Insufficient documentation

## 2019-03-09 DIAGNOSIS — R1110 Vomiting, unspecified: Secondary | ICD-10-CM | POA: Insufficient documentation

## 2019-03-09 DIAGNOSIS — L0291 Cutaneous abscess, unspecified: Secondary | ICD-10-CM

## 2019-03-09 LAB — CBC
HCT: 40.9 % (ref 36.0–46.0)
Hemoglobin: 12.6 g/dL (ref 12.0–15.0)
MCH: 25.3 pg — ABNORMAL LOW (ref 26.0–34.0)
MCHC: 30.8 g/dL (ref 30.0–36.0)
MCV: 82 fL (ref 80.0–100.0)
Platelets: 246 10*3/uL (ref 150–400)
RBC: 4.99 MIL/uL (ref 3.87–5.11)
RDW: 13.4 % (ref 11.5–15.5)
WBC: 9.7 10*3/uL (ref 4.0–10.5)
nRBC: 0 % (ref 0.0–0.2)

## 2019-03-09 LAB — COMPREHENSIVE METABOLIC PANEL
ALT: 18 U/L (ref 0–44)
AST: 13 U/L — ABNORMAL LOW (ref 15–41)
Albumin: 3.6 g/dL (ref 3.5–5.0)
Alkaline Phosphatase: 62 U/L (ref 38–126)
Anion gap: 8 (ref 5–15)
BUN: 7 mg/dL (ref 6–20)
CO2: 22 mmol/L (ref 22–32)
Calcium: 9.2 mg/dL (ref 8.9–10.3)
Chloride: 110 mmol/L (ref 98–111)
Creatinine, Ser: 0.6 mg/dL (ref 0.44–1.00)
GFR calc Af Amer: >60 mL/min (ref >60)
GFR calc non Af Amer: >60 mL/min (ref >60)
Glucose, Bld: 138 mg/dL — ABNORMAL HIGH (ref 70–99)
Potassium: 3.7 mmol/L (ref 3.5–5.1)
Sodium: 140 mmol/L (ref 135–145)
Total Bilirubin: 0.8 mg/dL (ref 0.3–1.2)
Total Protein: 7.5 g/dL (ref 6.5–8.1)

## 2019-03-09 LAB — URINALYSIS, ROUTINE W REFLEX MICROSCOPIC
Bilirubin Urine: NEGATIVE
Glucose, UA: NEGATIVE mg/dL
Ketones, ur: NEGATIVE mg/dL
Nitrite: NEGATIVE
Protein, ur: 100 mg/dL — AB
Specific Gravity, Urine: 1.025 (ref 1.005–1.030)
pH: 5 (ref 5.0–8.0)

## 2019-03-09 LAB — LIPASE, BLOOD: Lipase: 42 U/L (ref 11–51)

## 2019-03-09 LAB — CBG MONITORING, ED: Glucose-Capillary: 148 mg/dL — ABNORMAL HIGH (ref 70–99)

## 2019-03-09 LAB — I-STAT BETA HCG BLOOD, ED (MC, WL, AP ONLY): I-stat hCG, quantitative: <5 m[IU]/mL (ref <5)

## 2019-03-09 MED ORDER — SULFAMETHOXAZOLE-TRIMETHOPRIM 800-160 MG PO TABS: 1.0000 | ORAL_TABLET | Freq: Two times a day (BID) | ORAL | 0 refills | Status: DC

## 2019-03-09 MED ORDER — SODIUM CHLORIDE 0.9% FLUSH: 3.0000 mL | Freq: Once | INTRAVENOUS | Status: DC

## 2019-03-09 MED ORDER — PREDNISONE 5 MG PO TABS: ORAL_TABLET | ORAL | 0 refills | Status: DC
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
## 2019-03-09 NOTE — ED Triage Notes (Signed)
Pt states she has a abscess on her back. X 2 days ( red and swollen )

## 2019-03-09 NOTE — Discharge Instructions (Signed)
Please try the new antibiotic  Please check your blood sugar on the prednisone  Please follow up with your primary doctor about your scalp  Please avoid any chemicals or treatment to your hair in the meantime  Please follow up if no improvement.

## 2019-03-09 NOTE — ED Notes (Signed)
Patient to [facility]-did not advise staff she was leaving.

## 2019-03-09 NOTE — ED Triage Notes (Signed)
C/o generalized abd pain, vomiting, constipation, and blood sugars high x 2 days.  States she got her Depo shot on Nov. 10th but that she was late getting it and wants to make sure she isn't pregnant.

## 2019-03-12 LAB — AEROBIC CULTURE W GRAM STAIN (SUPERFICIAL SPECIMEN)

## 2019-03-12 NOTE — Progress Notes (Signed)
Patient ID: Stephanie Mack, female   DOB: 02/18/1991, 28 y.o.   MRN: [id number] Patient seen and assessed by nursing staff during this encounter. I have reviewed the chart and agree with the documentation and plan.  Emeterio Reeve, MD 03/12/2019 3:47 PM

## 2019-03-13 ENCOUNTER — Telehealth (HOSPITAL_COMMUNITY): Payer: Self-pay | Admitting: Emergency Medicine

## 2019-03-13 NOTE — Telephone Encounter (Signed)
Wound culture shows MRSA, treated with bactrim, per Claiborne Billings, 7 days is sufficient. Pt instructed to monitor symptoms and if not improving after 7 day course of treatment, to return for wound recheck. Pt verbalized understanding, all questions answered.

## 2019-05-08 ENCOUNTER — Other Ambulatory Visit: Payer: Self-pay

## 2019-05-08 ENCOUNTER — Ambulatory Visit (INDEPENDENT_AMBULATORY_CARE_PROVIDER_SITE_OTHER): Payer: Medicaid Other

## 2019-05-08 VITALS — BP 139/91 | HR 91 | Ht 62.0 in | Wt 167.4 lb

## 2019-05-08 DIAGNOSIS — Z3042 Encounter for surveillance of injectable contraceptive: Secondary | ICD-10-CM

## 2019-05-08 MED ORDER — MEDROXYPROGESTERONE ACETATE 150 MG/ML IM SUSP
150.0000 mg | Freq: Once | INTRAMUSCULAR | Status: AC
  Administered 2019-05-08: 15:00:00 150 mg via INTRAMUSCULAR

## 2019-05-08 NOTE — Progress Notes (Signed)
GYN presents for DEPO, given in [facility], tolerated well.  Next DEPO 04/21-08/07/2019  Administrations This Visit    medroxyPROGESTERone (DEPO-PROVERA) injection 150 mg    Admin Date 05/08/2019 Action Given Dose 150 mg Route Intramuscular Administered By Tamela Oddi, RMA

## 2019-06-20 NOTE — Progress Notes (Signed)
    SUBJECTIVE:   CHIEF COMPLAINT / HPI:   DM Her current medication includes metformin 500 bid.  Bipolar 1 She is currently monitored by a psychiatrist who prescribes her Abilify.  She reports that her depressive symptoms are currently well controlled.  HTN She is a history of hypertension and has a number of antihypertensives on her medication list.  She reports only taking propanolol daily.  Social history She is currently in a romantic relationship and is interested in becoming pregnant.  She is not currently using any form of birth control.  Lives with mom, grandmother, uncle, daughter.  She feels safe at home.  Vaginal discharge She has noted recent vaginal discharge in the past week.  Is white in color, nonchunky.  No malodor.  No fever, nausea.  She notes some sharp suprapubic pain in addition to achy abdominal pain in her lower quadrants.  PERTINENT  PMH / PSH: See HPI  OBJECTIVE:   BP 114/80   Pulse 81   Ht 5\' 2"  (1.575 m)   Wt 165 lb 8 oz (75.1 kg)   SpO2 99%   BMI 30.27 kg/m    General: Alert and cooperative and appears to be in no acute distress HEENT: Neck non-tender without lymphadenopathy, masses or thyromegaly Cardio: Normal S1 and S2, no S3 or S4. Rhythm is regular. No murmurs or rubs.   Pulm: Clear to auscultation bilaterally, no crackles, wheezing, or diminished breath sounds. Normal respiratory effort Abdomen: Bowel sounds normal. Abdomen soft and non-tender.  Pelvic: Normal external genitalia.  Healthy vaginal mucosa.  Mild leukorrhea.  Samples taken.  No cervical motion tenderness.  No adnexal masses appreciated. Extremities: No peripheral edema. Warm/ well perfused.  Strong radial pulse. Neuro: Cranial nerves grossly intact   ASSESSMENT/PLAN:   Abdominal pain in female patient UA is unremarkable.  Pregnancy test negative.  No cervical motion tenderness on exam.  Will follow up with STI testing.  High suspicion for vaginal/pelvic  infection. -Follow-up STI testing -Return precautions provided  Type 2 diabetes mellitus ([place]) Mildly worsened A1c.  Foot exam performed today. -Increase Metformin to 1000 mg twice daily -Ambulatory referral to ophthalmology for diabetic eye exam -Return to clinic in 3 months  Essential hypertension, benign Stable today.  On further review of her chart, her propanolol was prescribed only as 14 pills been clearly not intended for daily use.  We will follow-up with this medication use at subsequent visit.  Her blood pressure appears to be well controlled.  Bipolar 1 disorder ([place]) Continue current medication.  Abilify will be monitored by psychiatric provider.     Matilde Haymaker, MD [place]

## 2019-06-21 ENCOUNTER — Other Ambulatory Visit: Payer: Self-pay

## 2019-06-21 ENCOUNTER — Other Ambulatory Visit (HOSPITAL_COMMUNITY)
Admission: RE | Admit: 2019-06-21 | Discharge: 2019-06-21 | Disposition: A | Discharge status: Home / Self Care | Payer: Medicaid Other | Source: Ambulatory Visit | Attending: Family Medicine | Admitting: Family Medicine

## 2019-06-21 ENCOUNTER — Ambulatory Visit (INDEPENDENT_AMBULATORY_CARE_PROVIDER_SITE_OTHER): Payer: Medicaid Other | Admitting: Family Medicine

## 2019-06-21 ENCOUNTER — Encounter: Payer: Self-pay | Admitting: Family Medicine

## 2019-06-21 VITALS — BP 114/80 | HR 81 | Ht 62.0 in | Wt 165.5 lb

## 2019-06-21 DIAGNOSIS — E118 Type 2 diabetes mellitus with unspecified complications: Secondary | ICD-10-CM

## 2019-06-21 DIAGNOSIS — N898 Other specified noninflammatory disorders of vagina: Secondary | ICD-10-CM | POA: Insufficient documentation

## 2019-06-21 DIAGNOSIS — Z3200 Encounter for pregnancy test, result unknown: Secondary | ICD-10-CM

## 2019-06-21 DIAGNOSIS — F319 Bipolar disorder, unspecified: Secondary | ICD-10-CM

## 2019-06-21 DIAGNOSIS — E119 Type 2 diabetes mellitus without complications: Secondary | ICD-10-CM

## 2019-06-21 DIAGNOSIS — I10 Essential (primary) hypertension: Secondary | ICD-10-CM

## 2019-06-21 DIAGNOSIS — R109 Unspecified abdominal pain: Secondary | ICD-10-CM | POA: Diagnosis not present

## 2019-06-21 LAB — POCT URINALYSIS DIP (MANUAL ENTRY)
Bilirubin, UA: NEGATIVE
Glucose, UA: NEGATIVE mg/dL
Ketones, POC UA: NEGATIVE mg/dL
Leukocytes, UA: NEGATIVE
Nitrite, UA: NEGATIVE
Protein Ur, POC: 30 mg/dL — AB
Spec Grav, UA: 1.025 (ref 1.010–1.025)
Urobilinogen, UA: 0.2 E.U./dL
pH, UA: 6 (ref 5.0–8.0)

## 2019-06-21 LAB — POCT GLYCOSYLATED HEMOGLOBIN (HGB A1C): HbA1c, POC (controlled diabetic range): 7.1 % — AB (ref 0.0–7.0)

## 2019-06-21 LAB — POCT UA - MICROSCOPIC ONLY

## 2019-06-21 LAB — POCT URINE PREGNANCY: Preg Test, Ur: NEGATIVE

## 2019-06-21 MED ORDER — METFORMIN HCL 500 MG PO TABS: 1000.0000 mg | ORAL_TABLET | Freq: Two times a day (BID) | ORAL | 5 refills | Status: DC

## 2019-06-21 MED ORDER — PRENATAL VITAMINS 28-0.8 MG PO TABS: 1.0000 | ORAL_TABLET | Freq: Every day | ORAL | 2 refills | Status: DC

## 2019-06-21 NOTE — Patient Instructions (Addendum)
It was nice to meet you today [name].  Is a quick review of the things we talked about:  Diabetes: Looks like your A1c is the tiniest bit elevated today, were going to increase her Metformin.  You can start taking 2 pills in the morning and 2 pills in the evening.  We also referred you to ophthalmology for diabetic eye exam.  She got a phone call about this in the next week or 2.  Please let us know if you have not heard anything in the next 2 weeks.  Regarding your abdominal pain, not positive what the cause of your abdominal pain is.  It does not look like you have a urinary tract infection.  We are going to send out the lab today to see if there is any evidence of a pelvic or vaginal infection.  I will let you know the results.  If you do find that your abdominal pain is worsening and he began to feel feverish or nauseated I recommend seeing a provider very soon.

## 2019-06-23 NOTE — Assessment & Plan Note (Signed)
Stable today.  On further review of her chart, her propanolol was prescribed only as 14 pills been clearly not intended for daily use.  We will follow-up with this medication use at subsequent visit.  Her blood pressure appears to be well controlled.

## 2019-06-23 NOTE — Assessment & Plan Note (Signed)
UA is unremarkable.  Pregnancy test negative.  No cervical motion tenderness on exam.  Will follow up with STI testing.  High suspicion for vaginal/pelvic infection. -Follow-up STI testing -Return precautions provided

## 2019-06-23 NOTE — Assessment & Plan Note (Signed)
Mildly worsened A1c.  Foot exam performed today. -Increase Metformin to 1000 mg twice daily -Ambulatory referral to ophthalmology for diabetic eye exam -Return to clinic in 3 months

## 2019-06-23 NOTE — Assessment & Plan Note (Signed)
Continue current medication.  Abilify will be monitored by psychiatric provider.

## 2019-06-25 ENCOUNTER — Other Ambulatory Visit: Payer: Self-pay | Admitting: Family Medicine

## 2019-06-25 LAB — CERVICOVAGINAL ANCILLARY ONLY
Bacterial Vaginitis (gardnerella): NEGATIVE
Candida Glabrata: NEGATIVE
Candida Vaginitis: NEGATIVE
Chlamydia: NEGATIVE
Comment: NEGATIVE
Comment: NEGATIVE
Comment: NEGATIVE
Comment: NEGATIVE
Comment: NEGATIVE
Comment: NORMAL
Neisseria Gonorrhea: NEGATIVE
Trichomonas: NEGATIVE

## 2019-06-25 LAB — URINE CULTURE

## 2019-06-25 MED ORDER — CEPHALEXIN 500 MG PO CAPS: 500.0000 mg | ORAL_CAPSULE | Freq: Two times a day (BID) | ORAL | 0 refills | Status: DC

## 2019-06-25 NOTE — Progress Notes (Signed)
Ms. Rhyner was informed that her labs are consistent with a urinary tract infection.  She was prescribed Keflex 500 mg p.o. twice daily for 3 days.

## 2019-07-02 ENCOUNTER — Ambulatory Visit: Payer: Medicaid Other

## 2019-07-12 ENCOUNTER — Telehealth (INDEPENDENT_AMBULATORY_CARE_PROVIDER_SITE_OTHER): Payer: Medicaid Other | Admitting: Family Medicine

## 2019-07-12 ENCOUNTER — Encounter: Payer: Self-pay | Admitting: Family Medicine

## 2019-07-12 ENCOUNTER — Telehealth: Payer: Medicaid Other | Admitting: Family Medicine

## 2019-07-12 VITALS — BP 143/98 | Wt 155.0 lb

## 2019-07-12 DIAGNOSIS — R109 Unspecified abdominal pain: Secondary | ICD-10-CM

## 2019-07-12 NOTE — Progress Notes (Signed)
Entered in error

## 2019-07-12 NOTE — Progress Notes (Signed)
[facility] Telemedicine Visit  Patient consented to have virtual visit. Method of visit: Video  Encounter participants: Patient: Stephanie Mack - located parked in her car Provider: Matilde Haymaker - located at [facility] Others (if applicable): None  Chief Complaint: Stomach swelling  HPI:  Stomach swelling Stephanie Mack made a visit today because she wanted to talk about swelling in her stomach.  She had noticed stomach swelling for the past 2-4 weeks.  She is not positive exactly when this might of started.  In addition to her swelling she is noticing mild fatigue and some stomach aching below her bellybutton which is mild in intensity.  She does not have any burning with urination no she does report some increased urinary frequency.  She was recently seen in clinic and diagnosed with a UTI for which she completed antibiotic therapy of 3 days of Keflex twice daily.  Apart from these symptoms, she has no other complaints.  Our review of systems include the following: She denies fever, rashes, nasal congestion, cough, shortness of breath, palpitations, nausea, vomiting, diarrhea, constipation, chills, tremors, dysuria, vaginal discharge.  ROS: per HPI  Pertinent PMHx: Pseudocyesis, recent UTI, previous PID  Exam:  General: Seated comfortably in the passenger seat of her car.  Occasionally distracted by her children outside of the car.  Able to move around the car comfortably without any significant discomfort or distress. Respiratory: Breathing comfortably on room air.  Normal respiratory effort.  Able to complete long sentences without effort. Abdomen: Protuberant stomach  Assessment/Plan:  Stomach pain It is difficult to fully address her stomach complaint over the phone.  It seems her main complaint is her stomach swelling and the stomach aching and polyuria are only secondary issues.  The differential for her stomach swelling and discomfort includes: Pregnancy,  pseudocyesis, PID, UTI.  Very low suspicion for acute abdomen based on her history and very comfortable appearance.  Low suspicion for UTI based on recent culture and treatment data.  Her history does not seem consistent with PID at this time.  Pregnancy and pseudocyesis remain high in the differential.  She was informed that it is difficult to say exactly what is causing her stomach issue at this time although I think we have enough information to rule out dangerous causes.  She was encouraged to schedule a clinic visit at her convenience so that basic labs could be done to rule out pregnancy and a physical exam could be performed to further assess her stomach discomfort and swelling.    Time spent during visit with patient: 15 minutes

## 2019-07-12 NOTE — Assessment & Plan Note (Addendum)
It is difficult to fully address her stomach complaint over the phone.  It seems her main complaint is her stomach swelling and the stomach aching and polyuria are only secondary issues.  The differential for her stomach swelling and discomfort includes: Pregnancy, pseudocyesis, PID, UTI.  Very low suspicion for acute abdomen based on her history and very comfortable appearance.  Low suspicion for UTI based on recent culture and treatment data.  Her history does not seem consistent with PID at this time.  Pregnancy and pseudocyesis remain high in the differential.  She was informed that it is difficult to say exactly what is causing her stomach issue at this time although I think we have enough information to rule out dangerous causes.  She was encouraged to schedule a clinic visit at her convenience so that basic labs could be done to rule out pregnancy and a physical exam could be performed to further assess her stomach discomfort and swelling.

## 2019-07-15 ENCOUNTER — Telehealth: Payer: Medicaid Other

## 2019-07-16 ENCOUNTER — Ambulatory Visit (INDEPENDENT_AMBULATORY_CARE_PROVIDER_SITE_OTHER): Payer: Medicaid Other | Admitting: Family Medicine

## 2019-07-16 ENCOUNTER — Other Ambulatory Visit: Payer: Self-pay

## 2019-07-16 VITALS — BP 126/84 | HR 70 | Ht 62.0 in | Wt 166.0 lb

## 2019-07-16 DIAGNOSIS — Z309 Encounter for contraceptive management, unspecified: Secondary | ICD-10-CM | POA: Diagnosis not present

## 2019-07-16 DIAGNOSIS — N926 Irregular menstruation, unspecified: Secondary | ICD-10-CM

## 2019-07-16 LAB — POCT URINE PREGNANCY: Preg Test, Ur: NEGATIVE

## 2019-07-16 NOTE — Patient Instructions (Signed)
It was great seeing you today!  Your parasite test came back negative.  I believe that your.  Duration is shortened a little bit because you stopped her Depo this month.  I am giving you a handout for all different types of birth control options.  If you are not trying to become pregnant I would recommend taking 1 of these options as soon as possible.

## 2019-07-16 NOTE — Assessment & Plan Note (Addendum)
Upreg negative.  Her shorter menstrual cycle is likely due to the effect of her Depo shot wearing off.  I did offer patient a handout with various types of contraception, she will think about these and come back for follow-up.

## 2019-07-16 NOTE — Progress Notes (Signed)
   CHIEF COMPLAINT / HPI: 28 year old female who presents for shorter duration of period than usual.  Patient states that her menstrual cycle only lasted for 2 days with a white lighter flow than usual.  Her menstrual cycle usually last for anywhere from 5 to 7 days typically.  Of note the patient stopped taking her Depo shot and a 3 months would have run out this month per her report.  Patient is concerned that she may be pregnant.  PERTINENT  PMH / PSH: None    OBJECTIVE: BP 126/84   Pulse 70   Ht 5\' 2"  (1.575 m)   Wt 166 lb (75.3 kg)   LMP 07/13/2019 (Exact Date)   BMI 30.36 kg/m   Gen: 29 year old, pleasant, African-American female, no acute distress CV: Skin warm and dry Resp: No accessory muscle use, no respiratory distress Neuro: Alert and oriented, Speech clear, No gross deficits Urine pregnancy test: Negative  ASSESSMENT / PLAN:  Contraception management Upreg negative.  Her shorter menstrual cycle is likely due to the effect of her Depo shot wearing off.  I did offer patient a handout with various types of contraception, she will think about these and come back for follow-up.   Guadalupe Dawn MD PGY-3 Family Medicine Resident [place]

## 2019-07-25 ENCOUNTER — Other Ambulatory Visit: Payer: Self-pay

## 2019-07-25 ENCOUNTER — Ambulatory Visit (INDEPENDENT_AMBULATORY_CARE_PROVIDER_SITE_OTHER): Payer: Medicaid Other

## 2019-07-25 DIAGNOSIS — Z3202 Encounter for pregnancy test, result negative: Secondary | ICD-10-CM | POA: Diagnosis not present

## 2019-07-25 DIAGNOSIS — N912 Amenorrhea, unspecified: Secondary | ICD-10-CM

## 2019-07-25 DIAGNOSIS — L989 Disorder of the skin and subcutaneous tissue, unspecified: Secondary | ICD-10-CM | POA: Insufficient documentation

## 2019-07-25 LAB — POCT URINE PREGNANCY: Preg Test, Ur: NEGATIVE

## 2019-07-25 NOTE — Progress Notes (Signed)
I reviewed the nurses note and agree with the plan of care.   Shelly Bombard, MD 06/30/2017 10:46 AM

## 2019-07-25 NOTE — Progress Notes (Signed)
    Patient no-showed 07/26/2019 appt. Can reschedule as needed.  Milus Banister, [place], PGY-2 07/31/2019 9:46 PM

## 2019-07-25 NOTE — Progress Notes (Signed)
Ms. Whitty presents today for UPT. She has no unusual complaints. LMP: was on depo last inj was 02/14/19. She states that she did not get her last inj that was due in march 2021.     OBJECTIVE: Appears well, in no apparent distress.  OB History    Gravida  3   Para  2   Term  0   Preterm  2   AB  1   Living  1     SAB  1   TAB  0   Ectopic  0   Multiple  0   Live Births  2          Home UPT Result: did not take HPT In-Office UPT result: negative I have reviewed the patient's medical, obstetrical, social, and family histories, and medications.   ASSESSMENT: Negative pregnancy test  PLAN Patient informed that cycles can be delayed starting back after stopping depo injections. Advised patient to take HPT in a couple of weeks if she has not started her cycle. Patient has no other concerns.

## 2019-07-26 ENCOUNTER — Ambulatory Visit (INDEPENDENT_AMBULATORY_CARE_PROVIDER_SITE_OTHER): Payer: Medicaid Other | Admitting: Family Medicine

## 2019-07-26 DIAGNOSIS — L989 Disorder of the skin and subcutaneous tissue, unspecified: Secondary | ICD-10-CM

## 2019-07-26 DIAGNOSIS — Z5329 Procedure and treatment not carried out because of patient's decision for other reasons: Secondary | ICD-10-CM

## 2019-07-29 ENCOUNTER — Ambulatory Visit: Payer: Medicaid Other

## 2019-08-01 ENCOUNTER — Ambulatory Visit (HOSPITAL_COMMUNITY)
Admission: EM | Admit: 2019-08-01 | Discharge: 2019-08-01 | Disposition: A | Discharge status: Home / Self Care | Payer: Medicaid Other | Attending: Urgent Care | Admitting: Urgent Care

## 2019-08-01 DIAGNOSIS — R1030 Lower abdominal pain, unspecified: Secondary | ICD-10-CM | POA: Insufficient documentation

## 2019-08-01 DIAGNOSIS — N3090 Cystitis, unspecified without hematuria: Secondary | ICD-10-CM | POA: Insufficient documentation

## 2019-08-01 DIAGNOSIS — R000 Tachycardia, unspecified: Secondary | ICD-10-CM | POA: Diagnosis present

## 2019-08-01 DIAGNOSIS — R0981 Nasal congestion: Secondary | ICD-10-CM | POA: Diagnosis present

## 2019-08-01 DIAGNOSIS — Z3202 Encounter for pregnancy test, result negative: Secondary | ICD-10-CM | POA: Diagnosis not present

## 2019-08-01 LAB — POCT URINALYSIS DIP (DEVICE)
Bilirubin Urine: NEGATIVE
Glucose, UA: NEGATIVE mg/dL
Ketones, ur: NEGATIVE mg/dL
Leukocytes,Ua: NEGATIVE
Nitrite: POSITIVE — AB
Protein, ur: 100 mg/dL — AB
Specific Gravity, Urine: >=1.03 (ref 1.005–1.030)
Urobilinogen, UA: 0.2 mg/dL (ref 0.0–1.0)
pH: 6 (ref 5.0–8.0)

## 2019-08-01 LAB — POC URINE PREG, ED: Preg Test, Ur: NEGATIVE

## 2019-08-01 MED ORDER — SULFAMETHOXAZOLE-TRIMETHOPRIM 800-160 MG PO TABS: 1.0000 | ORAL_TABLET | Freq: Two times a day (BID) | ORAL | 0 refills | Status: DC

## 2019-08-01 MED ORDER — CETIRIZINE HCL 10 MG PO TABS: 10.0000 mg | ORAL_TABLET | Freq: Every day | ORAL | 0 refills | Status: DC

## 2019-08-01 NOTE — ED Triage Notes (Addendum)
C/o lower abdominal pain that started yesterday with nasal congestion. Requesting pregnancy test.   Refused COVID testing

## 2019-08-01 NOTE — ED Provider Notes (Signed)
[place]   MRN: [id number] DOB: 1990-04-12  Subjective:   Stephanie Mack is a 29 y.o. female presenting for 1 day history of nasal congestion, lower abdominal pain.  Patient would like to have a pregnancy test done.  She is not interested in COVID-19 testing.  No current facility-administered medications for this encounter.  Current Outpatient Medications:  .  ACCU-CHEK FASTCLIX LANCETS MISC, TEST UPTO 4 TIMES DAILY, Disp: 102 each, Rfl: 3 .  ARIPiprazole (ABILIFY) 20 MG tablet, Take 20 mg by mouth daily. Sees mental health. Unsure of dosage, Disp: , Rfl:  .  blood glucose meter kit and supplies KIT, Dispense based on patient and insurance preference. Use up to four times daily as directed. (FOR ICD-9 250.00, 250.01). (Patient not taking: Reported on 08/16/2018), Disp: 1 each, Rfl: 0 .  glucose blood test strip, TEST UPTO 4 TIMES DAILY (Patient not taking: Reported on 08/16/2018), Disp: 100 each, Rfl: 3 .  metFORMIN (GLUCOPHAGE) 500 MG tablet, Take 2 tablets (1,000 mg total) by mouth 2 (two) times daily with a meal., Disp: 60 tablet, Rfl: 5 .  potassium chloride SA (K-DUR,KLOR-CON) 20 MEQ tablet, Take 1 tablet (20 mEq total) by mouth 3 (three) times daily. (Patient not taking: Reported on 08/16/2018), Disp: 6 tablet, Rfl: 0 .  Prenatal Vit-Fe Fumarate-FA (PRENATAL VITAMINS) 28-0.8 MG TABS, Take 1 tablet by mouth daily., Disp: 90 tablet, Rfl: 2 .  propranolol (INDERAL) 10 MG tablet, Take 1 tablet (10 mg total) by mouth daily., Disp: 14 tablet, Rfl: 0   No Known Allergies  Past Medical History:  Diagnosis Date  . ADHD (attention deficit hyperactivity disorder)   . Anemia   . Anxiety   . Bipolar 1 disorder ([place])   . Chlamydia 05-31-10  . Chronic constipation   . Depression    depression  . GERD (gastroesophageal reflux disease)    with pregnancy  . Gestational diabetes    current pregnancy  . Gonorrhea contact, treated   . Headache   . Infection    UTI  . Pregnancy  induced hypertension    previous pregnancy  . Pseudocyesis 2013   Seen in MAU for percieved FM, abd distension. Normal exam.   . Sickle cell trait ([place])   . Type 2 diabetes mellitus ([employer]) 02/13/2017     Past Surgical History:  Procedure Laterality Date  . CESAREAN SECTION N/A 09/01/2012   Procedure:  Primary cesarean section with delivery of baby girl at 74.  Apgars 1/1.  ;  Surgeon: Frederico Hamman, MD;  Location: [facility];  Service: Obstetrics;  Laterality: N/A;  . CESAREAN SECTION N/A 01/07/2015   Procedure: REPEAT CESAREAN SECTION;  Surgeon: Shelly Bombard, MD;  Location: [facility];  Service: Obstetrics;  Laterality: N/A;  . NASAL SEPTUM SURGERY    . WISDOM TOOTH EXTRACTION      Family History  Problem Relation Age of Onset  . Kidney disease Mother   . Hypertension Father   . Cancer Sister   . Asthma Brother   . Diabetes Maternal Grandfather   . Heart disease Neg Hx     Social History   Tobacco Use  . Smoking status: Never Smoker  . Smokeless tobacco: Never Used  Substance Use Topics  . Alcohol use: No    Alcohol/week: 0.0 standard drinks  . Drug use: No    Review of Systems  Constitutional: Negative for fever and malaise/fatigue.  HENT: Positive for congestion. Negative for ear pain, sinus pain and  sore throat.   Eyes: Negative for discharge and redness.  Respiratory: Negative for cough, hemoptysis, shortness of breath and wheezing.   Cardiovascular: Negative for chest pain.  Gastrointestinal: Positive for abdominal pain. Negative for diarrhea, nausea and vomiting.  Genitourinary: Negative for dysuria, flank pain and hematuria.  Musculoskeletal: Negative for myalgias.  Skin: Negative for rash.  Neurological: Negative for dizziness, weakness and headaches.  Psychiatric/Behavioral: Negative for depression and substance abuse.     Objective:   Vitals: BP (!) 143/92 (BP Location: Left Arm)   Pulse (!) 121   Temp 99.6 F (37.6 C) (Oral) Comment: took  tylenol right before coming to UC  Resp 16   Wt 169 lb (76.7 kg)   LMP 07/13/2019 (Exact Date)   SpO2 99%   BMI 30.91 kg/m   BP Readings from Last 3 Encounters:  08/01/19 (!) 143/92  07/16/19 126/84  07/12/19 (!) 143/98   Pulse was 116-122 on recheck by PA-Umeka Wrench.   Physical Exam Constitutional:      General: She is not in acute distress.    Appearance: Normal appearance. She is well-developed and normal weight. She is not ill-appearing, toxic-appearing or diaphoretic.  HENT:     Head: Normocephalic and atraumatic.     Right Ear: External ear normal.     Left Ear: External ear normal.     Nose: Congestion and rhinorrhea present.     Mouth/Throat:     Mouth: Mucous membranes are moist.     Pharynx: Oropharynx is clear.  Eyes:     General: No scleral icterus.    Extraocular Movements: Extraocular movements intact.     Pupils: Pupils are equal, round, and reactive to light.  Cardiovascular:     Rate and Rhythm: Normal rate and regular rhythm.     Pulses: Normal pulses.     Heart sounds: Normal heart sounds. No murmur. No friction rub. No gallop.   Pulmonary:     Effort: Pulmonary effort is normal. No respiratory distress.     Breath sounds: Normal breath sounds. No stridor. No wheezing, rhonchi or rales.  Abdominal:     General: Bowel sounds are normal. There is no distension.     Palpations: Abdomen is soft. There is no mass.     Tenderness: There is no abdominal tenderness. There is no right CVA tenderness, left CVA tenderness, guarding or rebound.  Skin:    General: Skin is warm and dry.     Coloration: Skin is not pale.     Findings: No rash.  Neurological:     General: No focal deficit present.     Mental Status: She is alert and oriented to person, place, and time.  Psychiatric:        Mood and Affect: Mood normal.        Behavior: Behavior normal.        Thought Content: Thought content normal.        Judgment: Judgment normal.     Results for orders placed  or performed during the hospital encounter of 08/01/19 (from the past 24 hour(s))  POCT urinalysis dip (device)     Status: Abnormal   Collection Time: 08/01/19 12:03 PM  Result Value Ref Range   Glucose, UA NEGATIVE NEGATIVE mg/dL   Bilirubin Urine NEGATIVE NEGATIVE   Ketones, ur NEGATIVE NEGATIVE mg/dL   Specific Gravity, Urine >=1.030 1.005 - 1.030   Hgb urine dipstick MODERATE (A) NEGATIVE   pH 6.0 5.0 - 8.0   Protein,  ur 100 (A) NEGATIVE mg/dL   Urobilinogen, UA 0.2 0.0 - 1.0 mg/dL   Nitrite POSITIVE (A) NEGATIVE   Leukocytes,Ua NEGATIVE NEGATIVE  POC urine pregnancy     Status: None   Collection Time: 08/01/19 12:18 PM  Result Value Ref Range   Preg Test, Ur NEGATIVE NEGATIVE    Assessment and Plan :   PDMP not reviewed this encounter.  1. Lower abdominal pain   2. Cystitis   3. Nasal congestion   4. Tachycardia     I insisted that the patient get COVID-19 testing given her congestion, rhinorrhea and persistent tachycardia.  Counseled that this is a common finding in COVID-19 patients.  Patient ultimately refused.  Start Bactrim for hemorrhagic cystitis, urine culture pending Counseled patient on potential for adverse effects with medications prescribed/recommended today, ER and return-to-clinic precautions discussed, patient verbalized understanding.    Jaynee Eagles, PA-C 08/01/19 1325

## 2019-08-03 LAB — URINE CULTURE: Culture: >=100000 — AB

## 2019-08-19 ENCOUNTER — Ambulatory Visit (INDEPENDENT_AMBULATORY_CARE_PROVIDER_SITE_OTHER): Payer: Medicaid Other

## 2019-08-19 ENCOUNTER — Other Ambulatory Visit: Payer: Self-pay

## 2019-08-19 VITALS — BP 135/90 | HR 114 | Temp 98.0°F | Ht 62.0 in | Wt 171.0 lb

## 2019-08-19 DIAGNOSIS — Z30013 Encounter for initial prescription of injectable contraceptive: Secondary | ICD-10-CM

## 2019-08-19 DIAGNOSIS — Z3202 Encounter for pregnancy test, result negative: Secondary | ICD-10-CM

## 2019-08-19 LAB — POCT URINE PREGNANCY: Preg Test, Ur: NEGATIVE

## 2019-08-19 MED ORDER — MEDROXYPROGESTERONE ACETATE 150 MG/ML IM SUSP
150.0000 mg | Freq: Once | INTRAMUSCULAR | Status: AC
  Administered 2019-08-19: 150 mg via INTRAMUSCULAR

## 2019-08-19 NOTE — Progress Notes (Signed)
SUBJECTIVE: GYN presents for DEPO restart.  UPT today is NEGATIVE.   Administrations This Visit    medroxyPROGESTERone (DEPO-PROVERA) injection 150 mg    Admin Date 08/19/2019 Action Given Dose 150 mg Route Intramuscular Administered By Tamela Oddi, RMA         PLAN DEPO injection given in LUOQ, tolerated well.  Next DEPO August 2-16, 2021

## 2019-09-17 ENCOUNTER — Ambulatory Visit: Payer: Medicaid Other

## 2019-10-09 ENCOUNTER — Encounter (HOSPITAL_COMMUNITY): Payer: Self-pay

## 2019-10-09 ENCOUNTER — Ambulatory Visit (HOSPITAL_COMMUNITY)
Admission: EM | Admit: 2019-10-09 | Discharge: 2019-10-09 | Disposition: A | Discharge status: Home / Self Care | Payer: Medicaid Other | Attending: Family Medicine | Admitting: Family Medicine

## 2019-10-09 ENCOUNTER — Other Ambulatory Visit: Payer: Self-pay

## 2019-10-09 DIAGNOSIS — R102 Pelvic and perineal pain: Secondary | ICD-10-CM

## 2019-10-09 DIAGNOSIS — N939 Abnormal uterine and vaginal bleeding, unspecified: Secondary | ICD-10-CM | POA: Diagnosis not present

## 2019-10-09 DIAGNOSIS — E119 Type 2 diabetes mellitus without complications: Secondary | ICD-10-CM

## 2019-10-09 DIAGNOSIS — Z3202 Encounter for pregnancy test, result negative: Secondary | ICD-10-CM | POA: Diagnosis not present

## 2019-10-09 DIAGNOSIS — Z8742 Personal history of other diseases of the female genital tract: Secondary | ICD-10-CM | POA: Diagnosis present

## 2019-10-09 DIAGNOSIS — N921 Excessive and frequent menstruation with irregular cycle: Secondary | ICD-10-CM

## 2019-10-09 DIAGNOSIS — Z8619 Personal history of other infectious and parasitic diseases: Secondary | ICD-10-CM

## 2019-10-09 DIAGNOSIS — Z7251 High risk heterosexual behavior: Secondary | ICD-10-CM | POA: Diagnosis present

## 2019-10-09 LAB — POC URINE PREG, ED: Preg Test, Ur: NEGATIVE

## 2019-10-09 MED ORDER — AZITHROMYCIN 250 MG PO TABS: 1000.0000 mg | ORAL_TABLET | Freq: Once | ORAL | Status: DC

## 2019-10-09 MED ORDER — DOXYCYCLINE HYCLATE 100 MG PO CAPS: 100.0000 mg | ORAL_CAPSULE | Freq: Two times a day (BID) | ORAL | 0 refills | Status: DC

## 2019-10-09 MED ORDER — NAPROXEN 500 MG PO TABS
500.0000 mg | ORAL_TABLET | Freq: Two times a day (BID) | ORAL | 0 refills | Status: DC
Start: 2019-10-09 — End: 2021-02-20

## 2019-10-09 MED ORDER — CEFTRIAXONE SODIUM 500 MG IJ SOLR
500.0000 mg | Freq: Once | INTRAMUSCULAR | Status: AC
  Administered 2019-10-09: 500 mg via INTRAMUSCULAR

## 2019-10-09 MED ORDER — CEFTRIAXONE SODIUM 500 MG IJ SOLR
INTRAMUSCULAR | Status: AC
  Filled 2019-10-09: qty 500

## 2019-10-09 MED ORDER — METRONIDAZOLE 500 MG PO TABS
500.0000 mg | ORAL_TABLET | Freq: Two times a day (BID) | ORAL | 0 refills | Status: DC
Start: 2019-10-09 — End: 2020-01-02

## 2019-10-09 NOTE — Discharge Instructions (Addendum)
Stephanie Mack, I am treating you for sexually transmitted infection today with ceftriaxone injection and need you to pick up doxycycline as well.  This will cover for both gonorrhea and chlamydia.  With your history of frequent bacterial vaginosis I think it is important to cover for this as well with metronidazole.  Please avoid alcohol use while taking your antibiotics.  I highly emphasized that you should abstain from sex for at least a week until after you finish your antibiotics.  You have several tests pending including a urine culture and the cervical swab test that we will look for sexually transmitted infections, bacterial vaginosis and yeast infection.  Please make sure you follow-up with your gynecologist, Dr. Jodi Mourning, as soon as possible for recheck given your history of fibroids and ovarian cysts.

## 2019-10-09 NOTE — ED Provider Notes (Signed)
[place]   MRN: [id number] DOB: [birth year]  Subjective:   Stephanie Mack is a 29 y.o. female presenting for 2 day hx of acute onset heavy vaginal bleeding, irregular cycle. Has had irregular cycles. Has a hx of PID in 2016, history of persistent BV, yeast infections; history of chlamydia as recent as 2019, history of fibroids, cysts. Has 1 sex partner, does not use condoms for protection. Does not know if he is monogamous. Is seen by Dr. Jodi Mourning. Has not followed up recently.   No current facility-administered medications for this encounter.  Current Outpatient Medications:  .  ACCU-CHEK FASTCLIX LANCETS MISC, TEST UPTO 4 TIMES DAILY, Disp: 102 each, Rfl: 3 .  ARIPiprazole (ABILIFY) 20 MG tablet, Take 20 mg by mouth daily. Sees mental health. Unsure of dosage, Disp: , Rfl:  .  blood glucose meter kit and supplies KIT, Dispense based on patient and insurance preference. Use up to four times daily as directed. (FOR ICD-9 250.00, 250.01). (Patient not taking: Reported on 08/16/2018), Disp: 1 each, Rfl: 0 .  cetirizine (ZYRTEC ALLERGY) 10 MG tablet, Take 1 tablet (10 mg total) by mouth daily., Disp: 30 tablet, Rfl: 0 .  glucose blood test strip, TEST UPTO 4 TIMES DAILY (Patient not taking: Reported on 08/16/2018), Disp: 100 each, Rfl: 3 .  metFORMIN (GLUCOPHAGE) 500 MG tablet, Take 2 tablets (1,000 mg total) by mouth 2 (two) times daily with a meal., Disp: 60 tablet, Rfl: 5 .  potassium chloride SA (K-DUR,KLOR-CON) 20 MEQ tablet, Take 1 tablet (20 mEq total) by mouth 3 (three) times daily. (Patient not taking: Reported on 08/16/2018), Disp: 6 tablet, Rfl: 0 .  Prenatal Vit-Fe Fumarate-FA (PRENATAL VITAMINS) 28-0.8 MG TABS, Take 1 tablet by mouth daily., Disp: 90 tablet, Rfl: 2 .  propranolol (INDERAL) 10 MG tablet, Take 1 tablet (10 mg total) by mouth daily., Disp: 14 tablet, Rfl: 0 .  sulfamethoxazole-trimethoprim (BACTRIM DS) 800-160 MG tablet, Take 1 tablet by mouth 2 (two) times daily.,  Disp: 10 tablet, Rfl: 0   No Known Allergies  Past Medical History:  Diagnosis Date  . ADHD (attention deficit hyperactivity disorder)   . Anemia   . Anxiety   . Bipolar 1 disorder ([place])   . Chlamydia 05-31-10  . Chronic constipation   . Depression    depression  . GERD (gastroesophageal reflux disease)    with pregnancy  . Gestational diabetes    current pregnancy  . Gonorrhea contact, treated   . Headache   . Infection    UTI  . Pregnancy induced hypertension    previous pregnancy  . Pseudocyesis 2013   Seen in MAU for percieved FM, abd distension. Normal exam.   . Sickle cell trait ([place])   . Type 2 diabetes mellitus ([place]) 02/13/2017     Past Surgical History:  Procedure Laterality Date  . CESAREAN SECTION N/A 09/01/2012   Procedure:  Primary cesarean section with delivery of baby girl at 62.  Apgars 1/1.  ;  Surgeon: Frederico Hamman, MD;  Location: [facility];  Service: Obstetrics;  Laterality: N/A;  . CESAREAN SECTION N/A 01/07/2015   Procedure: REPEAT CESAREAN SECTION;  Surgeon: Shelly Bombard, MD;  Location: [facility];  Service: Obstetrics;  Laterality: N/A;  . NASAL SEPTUM SURGERY    . WISDOM TOOTH EXTRACTION      Family History  Problem Relation Age of Onset  . Kidney disease Mother   . Hypertension Father   . Cancer Sister   .  Asthma Brother   . Diabetes Maternal Grandfather   . Heart disease Neg Hx     Social History   Tobacco Use  . Smoking status: Never Smoker  . Smokeless tobacco: Never Used  Vaping Use  . Vaping Use: Never used  Substance Use Topics  . Alcohol use: No    Alcohol/week: 0.0 standard drinks  . Drug use: No    ROS   Objective:   Vitals: BP 128/80 (BP Location: Right Arm)   Pulse 100   Temp 98.1 F (36.7 C) (Oral)   Resp 18   SpO2 100%   Physical Exam Constitutional:      General: She is not in acute distress.    Appearance: Normal appearance. She is well-developed. She is not ill-appearing, toxic-appearing or  diaphoretic.  HENT:     Head: Normocephalic and atraumatic.     Nose: Nose normal.     Mouth/Throat:     Mouth: Mucous membranes are moist.  Eyes:     Extraocular Movements: Extraocular movements intact.     Pupils: Pupils are equal, round, and reactive to light.  Cardiovascular:     Rate and Rhythm: Normal rate and regular rhythm.     Pulses: Normal pulses.     Heart sounds: Normal heart sounds. No murmur heard.  No friction rub. No gallop.   Pulmonary:     Effort: Pulmonary effort is normal. No respiratory distress.     Breath sounds: Normal breath sounds. No stridor. No wheezing, rhonchi or rales.  Abdominal:     General: Bowel sounds are normal. There is distension.     Palpations: Abdomen is soft. There is no mass.     Tenderness: There is abdominal tenderness (generalized throughout, mild). There is guarding. There is no right CVA tenderness, left CVA tenderness or rebound.  Skin:    General: Skin is warm and dry.     Findings: No rash.  Neurological:     Mental Status: She is alert and oriented to person, place, and time.  Psychiatric:        Mood and Affect: Mood normal.        Behavior: Behavior normal.        Thought Content: Thought content normal.        Judgment: Judgment normal.     Results for orders placed or performed during the hospital encounter of 10/09/19 (from the past 24 hour(s))  POC urine pregnancy     Status: None   Collection Time: 10/09/19  4:00 PM  Result Value Ref Range   Preg Test, Ur NEGATIVE NEGATIVE   Urinalysis showed large blood, no glucose.   Assessment and Plan :   PDMP not reviewed this encounter.  1. Menorrhagia with irregular cycle   2. Pelvic cramping   3. Unprotected sex   4. Type 2 diabetes mellitus without complication, without long-term current use of insulin ([place])   5. History of chlamydia   6. History of pelvic inflammatory disease     Empiric treatment for STIs given patient is a high risk.  She is given IM  ceftriaxone in clinic.  Start doxycycline as an outpatient.  Given her history of frequent BV, will use metronidazole for this.  Naproxen for pain.  Labs pending.  Emphasized need to follow-up with her gynecologist soon as possible. Counseled patient on potential for adverse effects with medications prescribed/recommended today, ER and return-to-clinic precautions discussed, patient verbalized understanding.    Jaynee Eagles, [place] 10/09/19 1659

## 2019-10-09 NOTE — ED Triage Notes (Signed)
Pt presents with abnormal heavy amounts of vaginal bleeding X 2 days.  Pt states she has irregular periods and is unsure if she is pregnant.

## 2019-10-09 NOTE — ED Notes (Signed)
Urinalysis exceeded the limitations of the [employer] capabilities. Notified provider. Urine culture ordered

## 2019-10-10 LAB — CERVICOVAGINAL ANCILLARY ONLY
Bacterial Vaginitis (gardnerella): POSITIVE — AB
Candida Glabrata: NEGATIVE
Candida Vaginitis: NEGATIVE
Chlamydia: NEGATIVE
Comment: NEGATIVE
Comment: NEGATIVE
Comment: NEGATIVE
Comment: NEGATIVE
Comment: NEGATIVE
Comment: NORMAL
Neisseria Gonorrhea: NEGATIVE
Trichomonas: NEGATIVE

## 2019-10-11 LAB — URINE CULTURE: Culture: 50000 — AB

## 2019-10-15 ENCOUNTER — Other Ambulatory Visit: Payer: Self-pay | Admitting: Obstetrics

## 2019-10-15 DIAGNOSIS — N939 Abnormal uterine and vaginal bleeding, unspecified: Secondary | ICD-10-CM

## 2019-10-15 MED ORDER — ORTHO-NOVUM 1/35 (28) 1-35 MG-MCG PO TABS: 1.0000 | ORAL_TABLET | Freq: Every day | ORAL | 0 refills | Status: DC

## 2019-11-04 ENCOUNTER — Other Ambulatory Visit: Payer: Self-pay | Admitting: Obstetrics

## 2019-11-04 DIAGNOSIS — N939 Abnormal uterine and vaginal bleeding, unspecified: Secondary | ICD-10-CM

## 2019-11-11 ENCOUNTER — Other Ambulatory Visit: Payer: Self-pay

## 2019-11-11 ENCOUNTER — Ambulatory Visit (INDEPENDENT_AMBULATORY_CARE_PROVIDER_SITE_OTHER): Payer: Medicaid Other

## 2019-11-11 DIAGNOSIS — Z3042 Encounter for surveillance of injectable contraceptive: Secondary | ICD-10-CM | POA: Diagnosis not present

## 2019-11-11 MED ORDER — MEDROXYPROGESTERONE ACETATE 150 MG/ML IM SUSP
150.0000 mg | INTRAMUSCULAR | Status: DC
  Administered 2019-11-11: 150 mg via INTRAMUSCULAR

## 2019-11-11 NOTE — Progress Notes (Signed)
Patient presents for Depo injection today.  Depo given in Left Hip w/o any problems  Next Depo Due 01/27/20-02/10/20.  Pt used office supply today per pt pharmacy opened at [place] pt had 11 am appt  Mychart message was sent to pt that she could arrive at 11:15 am and go get Depo Rx I would wait.   Pt advised next appt bring Rx.  Pt agreeable.

## 2019-11-12 ENCOUNTER — Other Ambulatory Visit: Payer: Self-pay | Admitting: Obstetrics

## 2019-11-12 DIAGNOSIS — N939 Abnormal uterine and vaginal bleeding, unspecified: Secondary | ICD-10-CM

## 2019-11-12 MED ORDER — NORTREL 1/35 (28) 1-35 MG-MCG PO TABS: 1.0000 | ORAL_TABLET | Freq: Every day | ORAL | 0 refills | Status: DC

## 2019-11-12 NOTE — Telephone Encounter (Signed)
Nortrel 1/35 Rx for AUB

## 2019-11-17 NOTE — Progress Notes (Signed)
Patient ID: Stephanie Mack, female   DOB: 02/25/1991, 29 y.o.   MRN: [id number] Patient was assessed and managed by nursing staff during this encounter. I have reviewed the chart and agree with the documentation and plan. I have also made any necessary editorial changes.  Emeterio Reeve, MD 11/17/2019 11:40 PM

## 2019-11-18 ENCOUNTER — Other Ambulatory Visit: Payer: Self-pay | Admitting: Obstetrics

## 2019-11-18 DIAGNOSIS — Z30013 Encounter for initial prescription of injectable contraceptive: Secondary | ICD-10-CM

## 2019-11-29 ENCOUNTER — Other Ambulatory Visit: Payer: Self-pay

## 2019-11-29 ENCOUNTER — Encounter: Payer: Self-pay | Admitting: Family Medicine

## 2019-11-29 ENCOUNTER — Ambulatory Visit (INDEPENDENT_AMBULATORY_CARE_PROVIDER_SITE_OTHER): Payer: Medicaid Other | Admitting: Family Medicine

## 2019-11-29 VITALS — BP 110/60 | HR 96 | Ht 62.0 in | Wt 165.2 lb

## 2019-11-29 DIAGNOSIS — R109 Unspecified abdominal pain: Secondary | ICD-10-CM

## 2019-11-29 LAB — POCT URINALYSIS DIP (MANUAL ENTRY)
Bilirubin, UA: NEGATIVE
Glucose, UA: NEGATIVE mg/dL
Leukocytes, UA: NEGATIVE
Nitrite, UA: NEGATIVE
Protein Ur, POC: 100 mg/dL — AB
Spec Grav, UA: >=1.03 — AB (ref 1.010–1.025)
Urobilinogen, UA: 0.2 E.U./dL
pH, UA: 6 (ref 5.0–8.0)

## 2019-11-29 LAB — POCT UA - MICROSCOPIC ONLY

## 2019-11-29 LAB — POCT URINE PREGNANCY: Preg Test, Ur: NEGATIVE

## 2019-11-29 MED ORDER — FAMOTIDINE 20 MG PO TABS: 20.0000 mg | ORAL_TABLET | Freq: Two times a day (BID) | ORAL | 3 refills | Status: DC

## 2019-11-29 NOTE — Patient Instructions (Signed)
Abdominal pain: Based on what we talked about today, I think this abdominal pain might be from some gastric reflux, also called heartburn.  Lets start by treating you with a medicine called famotidine.  You can take this once daily for 2 weeks.  If your symptoms get better, you can use this medication only as needed.  If it does not get better then maybe you should do some more searching.  Please come back in 2-4 weeks to see how you are doing.  We will also get another urine sample at that time to make sure your urine looks okay.

## 2019-11-29 NOTE — Assessment & Plan Note (Signed)
Urine pregnancy test negative.  Urinalysis without evidence of infection.  Based on tenderness with palpation of the epigastric region, GERD seems most likely at this point.  Irritable bowel syndrome is also a consideration but we will first see if she is responsive to an H2 blocker.  Low suspicion for vaginal infection at this time due to lack of vaginal discharge or pelvic complaints. -Famotidine 20 mg daily for 2 weeks.  If this is helpful for symptom management continue to use as needed -Follow-up in 2-4 weeks to ensure symptoms have improved and to recheck urine for blood

## 2019-11-29 NOTE — Progress Notes (Signed)
    SUBJECTIVE:   CHIEF COMPLAINT / HPI:   Abdominal pain Ms. Stivers presents to clinic today to discuss abdominal pain.  In the past year, she has had 3 or 4 presentations to our clinic or urgent care for abdominal pain.  She has had a urinary tract infection and a BV diagnosis.  Today, she reports that she has abdominal pain feels similar to past presentations.  This pain is primarily an achy or sometimes sharp pain partly in her upper abdomen and partly in her lower abdomen.  This discomfort is sometimes associated with nausea.  She does not associated with bowel movements or meals.  It is sometimes worse when lying down at night.  She denies hard stools, bloody stools, dysuria, polyuria, fever, vaginal discharge.  She is currently sexually active with one partner on oral contraceptives for birth control.   Her last period was earlier in the month and she does not expect to be on her period anytime soon.  PERTINENT  PMH / PSH: Reproductive age female, dysmenorrhea, PID, frequent abdominal pain  OBJECTIVE:   BP 110/60   Pulse 96   Ht 5\' 2"  (1.575 m)   Wt 165 lb 4 oz (75 kg)   LMP 11/15/2019   SpO2 98%   BMI 30.22 kg/m    General: Alert and cooperative and appears to be in no acute distress HEENT: Neck non-tender without lymphadenopathy, masses or thyromegaly Cardio: Normal S1 and S2, no S3 or S4. Rhythm is regular. No murmurs or rubs.   Pulm: Clear to auscultation bilaterally, no crackles, wheezing, or diminished breath sounds. Normal respiratory effort Abdomen: Bowel sounds normal. Abdomen soft and tender.  Extremities: No peripheral edema. Warm/ well perfused.  Strong radial pulse.   ASSESSMENT/PLAN:   Abdominal pain Urine pregnancy test negative.  Urinalysis without evidence of infection.  Based on tenderness with palpation of the epigastric region, GERD seems most likely at this point.  Irritable bowel syndrome is also a consideration but we will first see if she is  responsive to an H2 blocker.  Low suspicion for vaginal infection at this time due to lack of vaginal discharge or pelvic complaints. -Famotidine 20 mg daily for 2 weeks.  If this is helpful for symptom management continue to use as needed -Follow-up in 2-4 weeks to ensure symptoms have improved and to recheck urine for blood     Matilde Haymaker, MD [place]

## 2019-12-16 ENCOUNTER — Ambulatory Visit: Payer: Medicaid Other | Admitting: Family Medicine

## 2019-12-28 ENCOUNTER — Encounter: Payer: Self-pay | Admitting: Family Medicine

## 2020-01-02 ENCOUNTER — Other Ambulatory Visit (HOSPITAL_COMMUNITY)
Admission: RE | Admit: 2020-01-02 | Discharge: 2020-01-02 | Disposition: A | Discharge status: Home / Self Care | Payer: Medicaid Other | Source: Ambulatory Visit | Attending: Family Medicine | Admitting: Family Medicine

## 2020-01-02 ENCOUNTER — Encounter: Payer: Self-pay | Admitting: Family Medicine

## 2020-01-02 ENCOUNTER — Other Ambulatory Visit: Payer: Self-pay

## 2020-01-02 ENCOUNTER — Ambulatory Visit (INDEPENDENT_AMBULATORY_CARE_PROVIDER_SITE_OTHER): Payer: Medicaid Other | Admitting: Family Medicine

## 2020-01-02 VITALS — BP 124/84 | Ht 62.0 in | Wt 163.0 lb

## 2020-01-02 DIAGNOSIS — N921 Excessive and frequent menstruation with irregular cycle: Secondary | ICD-10-CM | POA: Diagnosis not present

## 2020-01-02 DIAGNOSIS — N898 Other specified noninflammatory disorders of vagina: Secondary | ICD-10-CM | POA: Diagnosis present

## 2020-01-02 LAB — POCT URINE PREGNANCY: Preg Test, Ur: NEGATIVE

## 2020-01-02 MED ORDER — FLUCONAZOLE 150 MG PO TABS
150.0000 mg | ORAL_TABLET | Freq: Once | ORAL | 0 refills | Status: AC
Start: 2020-01-02 — End: 2020-01-02

## 2020-01-02 NOTE — Assessment & Plan Note (Signed)
No pelvic exam performed today due to patient declining pelvic exam.  Vaginal itching is most likely secondary to yeast infection.  We will plan to treat for yeast infection and continue to monitor symptoms.  She did note some burning with urination which may simply be related to an external dysuria from irritated vaginal mucosa.  We will see if this resolves with treatment for a yeast infection.  She may have an additional vaginal infection based on her history and current symptoms.  We will monitor her urine gonorrhea/chlamydia test.  She was encouraged to return to clinic if her symptoms have not resolved with the Diflucan. -Diflucan 150 mg -Follow-up GC/chlamydia

## 2020-01-02 NOTE — Patient Instructions (Signed)
Vaginal itching: This is most likely from yeast infection.  I have sent in a prescription to treat your yeast infection.  If this does not improve your symptoms, I recommend you come back to clinic and we can do more testing with a pelvic exam.  We are not able to take a closer look at your urine but I have a low suspicion for a UTI.

## 2020-01-02 NOTE — Progress Notes (Signed)
    SUBJECTIVE:   CHIEF COMPLAINT / HPI:   Vaginal itching Ms. Rowand presents to clinic today for 3-4 days of vaginal itching.  She occasionally has noted some yellowish vaginal discharge.  She does experience some burning with urination and notes that she may be having some urinary frequency as well.  She has mild lower abdominal pain.  She denies nausea, vomiting, fevers.  In addition to this itching, she notes that she has also started on her menstrual period which has been associated with some low abdominal and low back pain.  She is sexually active but is currently on 2 methods of contraception: Depo-Provera for birth control and OCP to control her menstrual bleeding.  She is interested in STI testing but declined a pelvic exam due to her current vaginal bleeding.  PERTINENT  PMH / PSH: Sexually active, previous PID, chlamydia infection  OBJECTIVE:   BP 124/84   Ht 5\' 2"  (1.575 m)   Wt 163 lb (73.9 kg)   LMP 12/30/2019 (Exact Date)   BMI 29.81 kg/m    General: Alert and cooperative and appears to be in no acute distress Cardio: Normal S1 and S2, no S3 or S4. Rhythm is regular. No murmurs or rubs.   Pulm: Clear to auscultation bilaterally, no crackles, wheezing, or diminished breath sounds. Normal respiratory effort Abdomen: Bowel sounds normal. Abdomen soft and mildly tender across lower quadrants.  No CVA tenderness. Extremities: No peripheral edema. Warm/ well perfused. .  ASSESSMENT/PLAN:   Vaginal itching No pelvic exam performed today due to patient declining pelvic exam.  Vaginal itching is most likely secondary to yeast infection.  We will plan to treat for yeast infection and continue to monitor symptoms.  She did note some burning with urination which may simply be related to an external dysuria from irritated vaginal mucosa.  We will see if this resolves with treatment for a yeast infection.  She may have an additional vaginal infection based on her history and  current symptoms.  We will monitor her urine gonorrhea/chlamydia test.  She was encouraged to return to clinic if her symptoms have not resolved with the Diflucan. -Diflucan 150 mg -Follow-up GC/chlamydia     Matilde Haymaker, MD [place]

## 2020-01-06 LAB — URINE CYTOLOGY ANCILLARY ONLY
Chlamydia: NEGATIVE
Comment: NEGATIVE
Comment: NEGATIVE
Comment: NORMAL
Neisseria Gonorrhea: NEGATIVE
Trichomonas: NEGATIVE

## 2020-01-07 ENCOUNTER — Encounter: Payer: Self-pay | Admitting: Family Medicine

## 2020-01-21 ENCOUNTER — Ambulatory Visit: Payer: Medicaid Other

## 2020-01-21 NOTE — Progress Notes (Deleted)
    SUBJECTIVE:   CHIEF COMPLAINT / HPI: " Sores on head"  Ms. Ledwell is a 29 year old female presenting to discuss the following:  Sores on head:  ***  PERTINENT  PMH / PSH: Type 2 diabetes, hypertension, chronic headaches, bipolar 1 disorder, history of herpes, ADHD  OBJECTIVE:   LMP 12/30/2019 (Exact Date)   ***  ASSESSMENT/PLAN:   No problem-specific Assessment & Plan notes found for this encounter.     Patriciaann Clan, [place]

## 2020-02-03 ENCOUNTER — Ambulatory Visit: Payer: Medicaid Other

## 2020-02-14 ENCOUNTER — Other Ambulatory Visit (HOSPITAL_COMMUNITY)
Admission: RE | Admit: 2020-02-14 | Discharge: 2020-02-14 | Disposition: A | Discharge status: Home / Self Care | Payer: Medicaid Other | Source: Ambulatory Visit | Attending: Family Medicine | Admitting: Family Medicine

## 2020-02-14 ENCOUNTER — Other Ambulatory Visit: Payer: Self-pay

## 2020-02-14 ENCOUNTER — Ambulatory Visit (INDEPENDENT_AMBULATORY_CARE_PROVIDER_SITE_OTHER): Payer: Medicaid Other | Admitting: Family Medicine

## 2020-02-14 VITALS — BP 108/58 | HR 87 | Ht 62.0 in | Wt 163.5 lb

## 2020-02-14 DIAGNOSIS — Z113 Encounter for screening for infections with a predominantly sexual mode of transmission: Secondary | ICD-10-CM | POA: Insufficient documentation

## 2020-02-14 DIAGNOSIS — N760 Acute vaginitis: Secondary | ICD-10-CM | POA: Diagnosis not present

## 2020-02-14 DIAGNOSIS — L509 Urticaria, unspecified: Secondary | ICD-10-CM | POA: Diagnosis not present

## 2020-02-14 DIAGNOSIS — L210 Seborrhea capitis: Secondary | ICD-10-CM | POA: Diagnosis not present

## 2020-02-14 DIAGNOSIS — B9689 Other specified bacterial agents as the cause of diseases classified elsewhere: Secondary | ICD-10-CM | POA: Diagnosis not present

## 2020-02-14 DIAGNOSIS — N926 Irregular menstruation, unspecified: Secondary | ICD-10-CM

## 2020-02-14 LAB — POCT WET PREP (WET MOUNT)
Clue Cells Wet Prep Whiff POC: POSITIVE
Trichomonas Wet Prep HPF POC: ABSENT

## 2020-02-14 LAB — POCT URINE PREGNANCY: Preg Test, Ur: NEGATIVE

## 2020-02-14 MED ORDER — CETIRIZINE HCL 10 MG PO TABS: 10.0000 mg | ORAL_TABLET | Freq: Every day | ORAL | 0 refills | Status: DC

## 2020-02-14 MED ORDER — FAMOTIDINE 20 MG PO TABS: 20.0000 mg | ORAL_TABLET | Freq: Two times a day (BID) | ORAL | 3 refills | Status: DC

## 2020-02-14 MED ORDER — METRONIDAZOLE 500 MG PO TABS
500.0000 mg | ORAL_TABLET | Freq: Two times a day (BID) | ORAL | 0 refills | Status: DC
Start: 2020-02-14 — End: 2020-02-18

## 2020-02-14 NOTE — Assessment & Plan Note (Signed)
This rash is most consistent with urticaria.  The differential also includes eczema/psoriasis.  She does have acanthosis nigra cans which makes the rash a little bit more difficult to assess.  She does have mild facial swelling without tongue swelling, throat closing, shortness of breath. -Start cetirizine 10 mg daily -Start famotidine 20 mg twice daily -Follow-up in 2 weeks to ensure improvement

## 2020-02-14 NOTE — Progress Notes (Signed)
    SUBJECTIVE:   CHIEF COMPLAINT / HPI:   Vaginal discharge She reports that she has been having several days of white, malodorous vaginal discharge.  She does note some itching.  She also notices some burning with urination.  Neck rash For the past 2 weeks, she has been having increasing redness and itching around her neck.  The redness seems to be spreading outward from her neck and splotches.  She is also having some dryness of the skin.  Initially, this was more prominent on her face and now seems to be around her neck and upper chest.  She is also noted a similar redness and itching in her pelvis.  Seborrhea She is also noted some flaking of her hair and wants to know if anything can be done to help with this.  PERTINENT  PMH / PSH: Diabetes   OBJECTIVE:   BP (!) 108/58   Pulse 87   Ht 5\' 2"  (1.575 m)   Wt 163 lb 8 oz (74.2 kg)   SpO2 96%   BMI 29.90 kg/m    General: Alert and cooperative and appears to be in no acute distress HEENT: A red blanching nonraised patch extends around her neck with some evidence of dryness and scaling on her cheeks.  There is also erythematous patches spreading below down her neckline. Abdomen: Bowel sounds normal. Abdomen soft and non-tender. Pelvis: External genitalia is notable for a blanching pink nonraised patch over her mons and perineum.  External genitalia is otherwise normal.  Moist vaginal mucosa.  Os visualized.  White vaginal discharge observed, samples taken.  No cervical motion tenderness.  No enlarged adnexa. Extremities: No peripheral edema. Warm/ well perfused.     ASSESSMENT/PLAN:   Bacterial vaginosis -Metronidazole twice daily for 7 days -Follow-up HIV, RPR, GC/chlamydia  Urticarial rash This rash is most consistent with urticaria.  The differential also includes eczema/psoriasis.  She does have acanthosis nigra cans which makes the rash a little bit more difficult to assess.  She does have mild facial swelling without  tongue swelling, throat closing, shortness of breath. -Start cetirizine 10 mg daily -Start famotidine 20 mg twice daily -Follow-up in 2 weeks to ensure improvement  Seborrhea capitis -Selsun Blue shampoo     Matilde Haymaker, MD [place]

## 2020-02-14 NOTE — Assessment & Plan Note (Signed)
-  Selsun Blue shampoo 

## 2020-02-14 NOTE — Patient Instructions (Addendum)
Red, itchy rash: I think this is most consistent with an urticarial rash.  This is likely a hypersensitivity/allergy to new food or soap.  I recommend that you start taking your famotidine 20 mg twice daily and your cetirizine once daily.  Do this every day for the next 2 weeks and return to clinic if is not improving.  Vaginal discharge: Based on the test she did in clinic today, looks like a bacterial vaginosis.  This will improve with 1 week of an antibiotic called metronidazole. I will let you know if there is any lab work that we need to follow-up with.  Seborrhea: Recommend that you start using Selsun Blue shampoo which she can buy over-the-counter.  This will help with the flaking of your scalp.

## 2020-02-14 NOTE — Assessment & Plan Note (Signed)
-  Metronidazole twice daily for 7 days -Follow-up HIV, RPR, GC/chlamydia

## 2020-02-15 LAB — RPR: RPR Ser Ql: NONREACTIVE

## 2020-02-15 LAB — HIV ANTIBODY (ROUTINE TESTING W REFLEX): HIV Screen 4th Generation wRfx: NONREACTIVE

## 2020-02-17 LAB — CERVICOVAGINAL ANCILLARY ONLY
Chlamydia: NEGATIVE
Comment: NEGATIVE
Comment: NEGATIVE
Comment: NORMAL
Neisseria Gonorrhea: NEGATIVE
Trichomonas: NEGATIVE

## 2020-02-18 ENCOUNTER — Encounter: Payer: Self-pay | Admitting: Family Medicine

## 2020-02-18 MED ORDER — METRONIDAZOLE 0.75 % VA GEL: 1.0000 | Freq: Two times a day (BID) | VAGINAL | 0 refills | Status: DC

## 2020-02-21 ENCOUNTER — Encounter: Payer: Self-pay | Admitting: Family Medicine

## 2020-02-25 NOTE — Telephone Encounter (Signed)
Please see below for Medicaid preferred medications.   To PCP  Please advise  Talbot Grumbling, RN

## 2020-03-02 MED ORDER — METRONIDAZOLE 0.75 % VA GEL: 1.0000 | Freq: Two times a day (BID) | VAGINAL | 0 refills | Status: AC

## 2020-04-10 ENCOUNTER — Encounter: Payer: Self-pay | Admitting: Family Medicine

## 2020-04-21 ENCOUNTER — Encounter: Payer: Self-pay | Admitting: Family Medicine

## 2020-04-21 ENCOUNTER — Ambulatory Visit: Payer: Medicaid Other | Admitting: Family Medicine

## 2020-04-22 ENCOUNTER — Ambulatory Visit: Payer: Medicaid Other | Admitting: Family Medicine

## 2020-04-26 NOTE — Progress Notes (Signed)
SUBJECTIVE:   CHIEF COMPLAINT / HPI:   C/f pregnancy: Patient cannot remember her last period, believes it may have been in summer 2021. She reports amenorrhea, insomnia, abdominal pain. Last intercourse 1 week ago. She has taken an at home pregnancy test which she reports was "fake positive" and had a weak second line. Today, urine pregnancy test is negative. Normal BM, one a day, soft, brown, no straining. No dysuria, some frequency. She denies using any birth control. She reports some vague abdominal pain on right side, both upper and lower.   DM: Last A1c in March 2021 of 7.1%. Patient is taking metformin 1000mg  BID. She does not check her blood sugar at home, reports having a glucometer.  Scalp: patient with significant, severe scale over entire scalp and ears. She has previously been treated with , with no improvement in symptoms. She has significant itching and discomfort, highly motivated to improve her scalp as this is very distressing to her.  Left forearm: patient with hypopigmented macules along dorsal forearm consistent with tinea versicolor, which she has had in the past. She is interested in treatment.  PERTINENT  PMH / PSH: pseudocyesis, DMT2, seborrhea capitis.  OBJECTIVE:   BP 120/84   Pulse 85   Ht 5\' 2"  (1.575 m)   Wt 158 lb 3.2 oz (71.8 kg)   SpO2 99%   BMI 28.94 kg/m   Nursing note and vitals reviewed GEN: age-appropriate AAW, resting comfortably in chair, NAD, obese HEENT: NCAT. Sclera without injection or icterus. Severe waxy scales over entire scalp, behind ears Abdomen: Normoactive bowel sounds. No tenderness to deep or light palpation. No rebound or guarding. Soft, and not distended, but very large abdomen Skin: hypopigmented macules diffusely down left dorsal forearm Neuro: AOx3 Ext: no edema Psych: Pleasant, responded appropriately, blunted affect  ASSESSMENT/PLAN:   Pseudocyesis Patient presents for appointment to confirm pregnancy with report of  "weak positive" test at home. Urine pregnancy today negative. Patient has not had a menstrual cycle in many months and does have a large abdomen with reports of vague pains. She has thought she was pregnant multiple times and has not been pregnant. For patient's comfort and to work up source of right-sided pains with large abdomen, will obtain complete abdominal US to look for ascites/liver/gallbladder dysfunction, and to help emphasize patient is not pregnant, will obtain pelvic US, also can r/o any ovarian or uterine structural abnormalities that may be source of pain/large abdomen/amenorrhea. Suspect that most likely dx is NASH/PCOS(insulin resistance) with patient's history of DMT2 and obesity. Precepted with Dr. 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 Patient is significantly distressed by state of scalp. Precepted with Dr. Nori Riis and recommend treatment with ketoconazole shampoo 2% with high-potency steroid (halobetasol oint 0.05%) for two weeks only. Discussed importance of keeping steroid away from face and using only for two weeks due to risk of skin thinning and hypopigmentation. Patient able to repeat care instructions in teachback. Follow up as needed.  Type 2 diabetes mellitus (HCC) A1c improved to 6.1% today from 7.1% 11 months ago. Recommend she continue metformin, refill sent. Discussed importance of diet and exercise. Urine microalbumin obtained as well, recommend annual ophthalmology appt. Return to care in 3 months with PCP.  Tinea versicolor Patient with history of tinea versicolor with classic sx of hypopigmented macules on left arm. Recommend using ketoconazole 2% gel on left arm BID x2 weeks. RTC as  needed.     Gladys Damme, MD [place]

## 2020-04-26 NOTE — Patient Instructions (Addendum)
It was a pleasure to see you today!  1. We are scheduling an ultrasound for your abdominal pain and distension. We will let you know when this is.  2. For your scalp: use ketoconazole SHAMPOO once a day until scale is gone. Use a warm wash cloth and scrub off scale to reach new skin underneath. Then you can use halobetasol (ultravate) lotion twice a day for no more than 2 weeks. Avoid this lotion in your face, eyes, or mouth. Do not use the lotion for more than 2 weeks. If no improvement, please follow up.   3. For the light spots on your left arm: use ketoconazole CREAM twice a day for 2 weeks. If no improvement, follow up.  4. For your diabetes, continue using metformin twice a day.  Be Well,  Dr. Chauncey Reading

## 2020-04-27 ENCOUNTER — Encounter: Payer: Self-pay | Admitting: Family Medicine

## 2020-04-27 ENCOUNTER — Ambulatory Visit (INDEPENDENT_AMBULATORY_CARE_PROVIDER_SITE_OTHER): Payer: Medicaid Other | Admitting: Family Medicine

## 2020-04-27 ENCOUNTER — Other Ambulatory Visit: Payer: Self-pay

## 2020-04-27 VITALS — BP 120/84 | HR 85 | Ht 62.0 in | Wt 158.2 lb

## 2020-04-27 DIAGNOSIS — N926 Irregular menstruation, unspecified: Secondary | ICD-10-CM

## 2020-04-27 DIAGNOSIS — R1030 Lower abdominal pain, unspecified: Secondary | ICD-10-CM | POA: Diagnosis not present

## 2020-04-27 DIAGNOSIS — B360 Pityriasis versicolor: Secondary | ICD-10-CM | POA: Diagnosis not present

## 2020-04-27 DIAGNOSIS — R109 Unspecified abdominal pain: Secondary | ICD-10-CM | POA: Diagnosis not present

## 2020-04-27 DIAGNOSIS — F458 Other somatoform disorders: Secondary | ICD-10-CM

## 2020-04-27 DIAGNOSIS — L210 Seborrhea capitis: Secondary | ICD-10-CM

## 2020-04-27 DIAGNOSIS — E119 Type 2 diabetes mellitus without complications: Secondary | ICD-10-CM

## 2020-04-27 DIAGNOSIS — E118 Type 2 diabetes mellitus with unspecified complications: Secondary | ICD-10-CM

## 2020-04-27 LAB — POCT GLYCOSYLATED HEMOGLOBIN (HGB A1C): Hemoglobin A1C: 6.1 % — AB (ref 4.0–5.6)

## 2020-04-27 LAB — POCT UA - MICROALBUMIN
Creatinine, POC: 200 mg/dL
Microalbumin Ur, POC: 80 mg/L

## 2020-04-27 LAB — POCT URINE PREGNANCY: Preg Test, Ur: NEGATIVE

## 2020-04-27 MED ORDER — KETOCONAZOLE 2 % EX GEL: 1.0000 | Freq: Two times a day (BID) | CUTANEOUS | 0 refills | Status: DC

## 2020-04-27 MED ORDER — METFORMIN HCL 500 MG PO TABS: 1000.0000 mg | ORAL_TABLET | Freq: Two times a day (BID) | ORAL | 5 refills | Status: DC

## 2020-04-27 MED ORDER — HALOBETASOL PROPIONATE 0.05 % EX OINT: TOPICAL_OINTMENT | Freq: Two times a day (BID) | CUTANEOUS | 0 refills | Status: DC

## 2020-04-27 MED ORDER — KETOCONAZOLE 2 % EX SHAM: 1.0000 "application " | MEDICATED_SHAMPOO | Freq: Every day | CUTANEOUS | 0 refills | Status: DC

## 2020-04-29 ENCOUNTER — Other Ambulatory Visit: Payer: Self-pay | Admitting: Family Medicine

## 2020-04-29 ENCOUNTER — Ambulatory Visit
Admission: RE | Admit: 2020-04-29 | Discharge: 2020-04-29 | Disposition: A | Discharge status: Home / Self Care | Payer: Medicaid Other | Source: Ambulatory Visit | Attending: Family Medicine | Admitting: Family Medicine

## 2020-04-29 ENCOUNTER — Other Ambulatory Visit: Payer: Medicaid Other

## 2020-04-29 DIAGNOSIS — F458 Other somatoform disorders: Secondary | ICD-10-CM

## 2020-04-29 DIAGNOSIS — R109 Unspecified abdominal pain: Secondary | ICD-10-CM

## 2020-04-29 DIAGNOSIS — N912 Amenorrhea, unspecified: Secondary | ICD-10-CM

## 2020-05-04 ENCOUNTER — Encounter: Payer: Self-pay | Admitting: Family Medicine

## 2020-05-10 DIAGNOSIS — B360 Pityriasis versicolor: Secondary | ICD-10-CM | POA: Insufficient documentation

## 2020-05-10 NOTE — Assessment & Plan Note (Addendum)
Patient presents for appointment to confirm pregnancy with report of "weak positive" test at home. Urine pregnancy today negative. Patient has not had a menstrual cycle in many months and does have a large abdomen with reports of vague pains. She has thought she was pregnant multiple times and has not been pregnant. For patient's comfort and to work up source of right-sided pains with large abdomen, will obtain complete abdominal US to look for ascites/liver/gallbladder dysfunction, and to help emphasize patient is not pregnant, will obtain pelvic US, also can r/o any ovarian or uterine structural abnormalities that may be source of pain/large abdomen/amenorrhea. Suspect that most likely dx is NASH/PCOS(insulin resistance) with patient's history of DMT2 and obesity. Precepted with Dr. Nori Riis

## 2020-05-10 NOTE — Assessment & Plan Note (Signed)
Patient with history of tinea versicolor with classic sx of hypopigmented macules on left arm. Recommend using ketoconazole 2% gel on left arm BID x2 weeks. RTC as needed.

## 2020-05-10 NOTE — Assessment & Plan Note (Signed)
A1c improved to 6.1% today from 7.1% 11 months ago. Recommend she continue metformin, refill sent. Discussed importance of diet and exercise. Urine microalbumin obtained as well, recommend annual ophthalmology appt. Return to care in 3 months with PCP.

## 2020-05-10 NOTE — Assessment & Plan Note (Signed)
Patient with very severe case of seborrhea capitis that has failed conservative tx with selsun blue. Patient is significantly distressed by state of scalp. Precepted with Dr. Nori Riis and recommend treatment with ketoconazole shampoo 2% with high-potency steroid (halobetasol oint 0.05%) for two weeks only. Discussed importance of keeping steroid away from face and using only for two weeks due to risk of skin thinning and hypopigmentation. Patient able to repeat care instructions in teachback. Follow up as needed.

## 2020-05-12 ENCOUNTER — Ambulatory Visit (HOSPITAL_COMMUNITY): Payer: Medicaid Other

## 2020-05-12 ENCOUNTER — Encounter: Payer: Self-pay | Admitting: Family Medicine

## 2020-05-19 ENCOUNTER — Ambulatory Visit (HOSPITAL_COMMUNITY): Payer: Medicaid Other | Attending: Family Medicine

## 2020-05-25 ENCOUNTER — Ambulatory Visit: Payer: Medicaid Other

## 2020-05-27 ENCOUNTER — Ambulatory Visit: Payer: Medicaid Other

## 2020-06-10 ENCOUNTER — Ambulatory Visit: Payer: Medicaid Other

## 2020-06-18 ENCOUNTER — Ambulatory Visit: Payer: Medicaid Other

## 2020-06-24 ENCOUNTER — Ambulatory Visit (INDEPENDENT_AMBULATORY_CARE_PROVIDER_SITE_OTHER): Payer: Medicaid Other

## 2020-06-24 ENCOUNTER — Other Ambulatory Visit: Payer: Self-pay

## 2020-06-24 DIAGNOSIS — N912 Amenorrhea, unspecified: Secondary | ICD-10-CM

## 2020-06-24 LAB — POCT PREGNANCY, URINE

## 2020-06-24 NOTE — Progress Notes (Addendum)
Ms. Buell presents today for UPT. She has no  pregnancy complaints at this time.  LMP: unknown    OBJECTIVE: Appears well, in no apparent distress.  OB History    Gravida  3   Para  2   Term  0   Preterm  2   AB  1   Living  1     SAB  1   IAB  0   Ectopic  0   Multiple  0   Live Births  2          Home UPT Result: negative In-Office UPT result: negative I have reviewed the patient's medical, obstetrical, social, and family histories, and medications.   ASSESSMENT: Negative pregnancy test  PLAN Patient to follow up with an office visit to discuss abnormal cycle.

## 2020-07-07 ENCOUNTER — Ambulatory Visit: Payer: Medicaid Other | Admitting: Obstetrics

## 2020-07-08 ENCOUNTER — Other Ambulatory Visit (HOSPITAL_COMMUNITY)
Admission: RE | Admit: 2020-07-08 | Discharge: 2020-07-08 | Disposition: A | Discharge status: Home / Self Care | Payer: Medicaid Other | Source: Ambulatory Visit | Attending: Obstetrics | Admitting: Obstetrics

## 2020-07-08 ENCOUNTER — Other Ambulatory Visit: Payer: Self-pay

## 2020-07-08 ENCOUNTER — Ambulatory Visit (INDEPENDENT_AMBULATORY_CARE_PROVIDER_SITE_OTHER): Payer: Medicaid Other | Admitting: Obstetrics

## 2020-07-08 ENCOUNTER — Encounter: Payer: Self-pay | Admitting: Obstetrics

## 2020-07-08 VITALS — BP 141/94 | HR 86 | Ht 62.0 in | Wt 157.0 lb

## 2020-07-08 DIAGNOSIS — Z3202 Encounter for pregnancy test, result negative: Secondary | ICD-10-CM | POA: Diagnosis not present

## 2020-07-08 DIAGNOSIS — Z3169 Encounter for other general counseling and advice on procreation: Secondary | ICD-10-CM | POA: Diagnosis not present

## 2020-07-08 DIAGNOSIS — Z01419 Encounter for gynecological examination (general) (routine) without abnormal findings: Secondary | ICD-10-CM | POA: Insufficient documentation

## 2020-07-08 DIAGNOSIS — Z0000 Encounter for general adult medical examination without abnormal findings: Secondary | ICD-10-CM | POA: Diagnosis not present

## 2020-07-08 DIAGNOSIS — N926 Irregular menstruation, unspecified: Secondary | ICD-10-CM | POA: Diagnosis not present

## 2020-07-08 LAB — POCT URINE PREGNANCY: Preg Test, Ur: NEGATIVE

## 2020-07-08 MED ORDER — VITAFOL ULTRA 29-0.6-0.4-200 MG PO CAPS: 1.0000 | ORAL_CAPSULE | Freq: Every day | ORAL | 4 refills | Status: DC

## 2020-07-08 NOTE — Progress Notes (Signed)
Subjective:        Stephanie Mack is a 30 y.o. female here for a routine exam.  Current complaints: Irregular menstrual cycles.   Negative subjective signs of pregnancy. Wants to conceive but has not been able to get pregnant.  She is taking folic acid.  Personal health questionnaire:  Is patient [name], have a family history of breast and/or ovarian cancer: no Is there a family history of uterine cancer diagnosed at age < 30, gastrointestinal cancer, urinary tract cancer, family member who is a Field seismologist syndrome-associated carrier: no Is the patient overweight and hypertensive, family history of diabetes, personal history of gestational diabetes, preeclampsia or PCOS: no Is patient over 62, have PCOS,  family history of premature CHD under age 41, diabetes, smoke, have hypertension or peripheral artery disease:  no At any time, has a partner hit, kicked or otherwise hurt or frightened you?: no Over the past 2 weeks, have you felt down, depressed or hopeless?: no Over the past 2 weeks, have you felt little interest or pleasure in doing things?:no   Gynecologic History No LMP recorded. (Menstrual status: Irregular Periods). Contraception: none Last Pap: 01-31-2017. Results were: normal Last mammogram: n/a. Results were: n/a  Obstetric History OB History  Gravida Para Term Preterm AB Living  3 2 0 2 1 1   SAB IAB Ectopic Multiple Live Births  1 0 0 0 2    # Outcome Date GA Lbr Len/2nd Weight Sex Delivery Anes PTL Lv  3 Preterm 01/07/15 [redacted]w[redacted]d 6 lb 3.8 oz (2.829 kg) F CS-Vac Spinal  LIV  2 Preterm 09/01/12 281w3d F CS-LTranv Spinal  DEC  1 SAB 09/16/09 6w33w5d         Birth Comments: Twin gestational sacs.     Past Medical History:  Diagnosis Date  . ADHD (attention deficit hyperactivity disorder)   . Anemia   . Anxiety   . Bipolar 1 disorder ([facility] . Chlamydia 05-31-10  . Chronic constipation   . Depression    depression  . GERD (gastroesophageal reflux  disease)    with pregnancy  . Gestational diabetes    current pregnancy  . Gonorrhea contact, treated   . Headache   . Infection    UTI  . Pregnancy induced hypertension    previous pregnancy  . Pseudocyesis 2013   Seen in MAU for percieved FM, abd distension. Normal exam.   . Sickle cell trait ([facility] . Type 2 diabetes mellitus ([facility]1/03/2017    Past Surgical History:  Procedure Laterality Date  . CESAREAN SECTION N/A 09/01/2012   Procedure:  Primary cesarean section with delivery of baby girl at 17478Apgars 1/1.  ;  Surgeon: BerFrederico HammanD;  [facility];  Service: Obstetrics;  Laterality: N/A;  . CESAREAN SECTION N/A 01/07/2015   Procedure: REPEAT CESAREAN SECTION;  Surgeon: ChaShelly BombardD;  [facility];  Service: Obstetrics;  Laterality: N/A;  . NASAL SEPTUM SURGERY    . WISDOM TOOTH EXTRACTION       Current Outpatient Medications:  .  Prenat-Fe Poly-Methfol-FA-DHA (VITAFOL ULTRA) 29-0.6-0.4-200 MG CAPS, Take 1 capsule by mouth daily before breakfast., Disp: 90 capsule, Rfl: 4 .  ACCU-CHEK FASTCLIX LANCETS MISC, TEST UPTO 4 TIMES DAILY (Patient not taking: Reported on 07/08/2020), Disp: 102 each, Rfl: 3 .  ARIPiprazole (ABILIFY) 20 MG tablet, Take 20 mg by mouth daily. Sees mental health. Unsure of dosage (Patient  not taking: Reported on 07/08/2020), Disp: , Rfl:  .  blood glucose meter kit and supplies KIT, Dispense based on patient and insurance preference. Use up to four times daily as directed. (FOR ICD-9 250.00, 250.01). (Patient not taking: No sig reported), Disp: 1 each, Rfl: 0 .  cetirizine (ZYRTEC ALLERGY) 10 MG tablet, Take 1 tablet (10 mg total) by mouth daily. (Patient not taking: Reported on 07/08/2020), Disp: 30 tablet, Rfl: 0 .  famotidine (PEPCID) 20 MG tablet, Take 1 tablet (20 mg total) by mouth 2 (two) times daily. (Patient not taking: Reported on 07/08/2020), Disp: 60 tablet, Rfl: 3 .  glucose blood test strip, TEST UPTO 4 TIMES DAILY (Patient  not taking: No sig reported), Disp: 100 each, Rfl: 3 .  halobetasol (ULTRAVATE) 0.05 % ointment, Apply topically 2 (two) times daily. To scalp only, avoid face, eyes, and mouth. Use only for 2 weeks and stop. (Patient not taking: Reported on 07/08/2020), Disp: 50 g, Rfl: 0 .  ketoconazole (NIZORAL) 2 % shampoo, Apply 1 application topically daily. To scalp (Patient not taking: Reported on 07/08/2020), Disp: 120 mL, Rfl: 0 .  Ketoconazole 2 % GEL, Apply 1 Squirt topically in the morning and at bedtime. For two weeks to left arm. (Patient not taking: Reported on 07/08/2020), Disp: 45 g, Rfl: 0 .  metFORMIN (GLUCOPHAGE) 500 MG tablet, Take 2 tablets (1,000 mg total) by mouth 2 (two) times daily with a meal. (Patient not taking: Reported on 07/08/2020), Disp: 60 tablet, Rfl: 5 .  naproxen (NAPROSYN) 500 MG tablet, Take 1 tablet (500 mg total) by mouth 2 (two) times daily with a meal. (Patient not taking: Reported on 07/08/2020), Disp: 30 tablet, Rfl: 0 .  norethindrone-ethinyl estradiol 1/35 (NORTREL 1/35, 28,) tablet, Take 1 tablet by mouth daily. (Patient not taking: Reported on 07/08/2020), Disp: 84 tablet, Rfl: 0 .  propranolol (INDERAL) 10 MG tablet, Take 1 tablet (10 mg total) by mouth daily. (Patient not taking: Reported on 07/08/2020), Disp: 14 tablet, Rfl: 0  Current Facility-Administered Medications:  .  medroxyPROGESTERone (DEPO-PROVERA) injection 150 mg, 150 mg, Intramuscular, Q90 days, Woodroe Mode, MD, 150 mg at 11/11/19 1134 No Known Allergies  Social History   Tobacco Use  . Smoking status: Never Smoker  . Smokeless tobacco: Never Used  Substance Use Topics  . Alcohol use: No    Alcohol/week: 0.0 standard drinks    Family History  Problem Relation Age of Onset  . Kidney disease Mother   . Hypertension Father   . Cancer Sister   . Asthma Brother   . Diabetes Maternal Grandfather   . Heart disease Neg Hx       Review of Systems  Constitutional: negative for fatigue and weight  loss Respiratory: negative for cough and wheezing Cardiovascular: negative for chest pain, fatigue and palpitations Gastrointestinal: negative for abdominal pain and change in bowel habits Musculoskeletal:negative for myalgias Neurological: negative for gait problems and tremors Behavioral/Psych: negative for abusive relationship, depression Endocrine: negative for temperature intolerance    Genitourinary: positive for abnormal menstrual periods - irregular.  Negative for genital lesions, hot flashes, sexual problems and vaginal discharge Integument/breast: negative for breast lump, breast tenderness, nipple discharge and skin lesion(s)    Objective:       BP (!) 141/94   Pulse 86   Ht 5' 2"  (1.575 m)   Wt 157 lb (71.2 kg)   BMI 28.72 kg/m  General:   alert and no distress  Skin:   no  rash or abnormalities  Lungs:   clear to auscultation bilaterally  Heart:   regular rate and rhythm, S1, S2 normal, no murmur, click, rub or gallop  Breasts:   normal without suspicious masses, skin or nipple changes or axillary nodes  Abdomen:  normal findings: no organomegaly, soft, non-tender and no hernia  Pelvis:  External genitalia: normal general appearance Urinary system: urethral meatus normal and bladder without fullness, nontender Vaginal: normal without tenderness, induration or masses Cervix: normal appearance Adnexa: normal bimanual exam Uterus: anteverted and non-tender, normal size   Lab Review Urine pregnancy test Labs reviewed yes Radiologic studies reviewed no  I have spent a total of 20 minutes of face-to-face time, excluding clinical staff time, reviewing notes and preparing to see patient, ordering tests and/or medications, and counseling the patient.  Assessment:     1. Encounter for gynecological examination with Papanicolaou smear of cervix Rx: - Cytology - PAP( [facility])  2. Missed periods Rx: - hCG, serum, qualitative:  Pending - POCT urine pregnancy:   NEGATIVE  3. Encounter for preconception consultation Rx: - Prenat-Fe Poly-Methfol-FA-DHA (VITAFOL ULTRA) 29-0.6-0.4-200 MG CAPS; Take 1 capsule by mouth daily before breakfast.  Dispense: 90 capsule; Refill: 4    Plan:    Education reviewed: calcium supplements, depression evaluation, low fat, low cholesterol diet, safe sex/STD prevention, self breast exams and weight bearing exercise. Follow up in: 1 year. [facility] for amenorrhea if serum beta-Hcg is negative  Meds ordered this encounter  Medications  . Prenat-Fe Poly-Methfol-FA-DHA (VITAFOL ULTRA) 29-0.6-0.4-200 MG CAPS    Sig: Take 1 capsule by mouth daily before breakfast.    Dispense:  90 capsule    Refill:  4   Orders Placed This Encounter  Procedures  . hCG, serum, qualitative  . POCT urine pregnancy    Shelly Bombard, MD 07/08/2020 2:45 PM

## 2020-07-08 NOTE — Progress Notes (Signed)
Patient presents for Annual Exam.  LMP: has not had cycle Last pap: 01/31/2017 WNL  Contraception: None  STD Screening: Declines  Family Hx of Breast Cancer: None   Pt had recent visit for UPT on 06/24/20 result was Negative.   Pt wants to discuss trying to conceive.

## 2020-07-09 LAB — HCG, SERUM, QUALITATIVE: hCG,Beta Subunit,Qual,Serum: NEGATIVE m[IU]/mL (ref <6)

## 2020-07-09 LAB — CYTOLOGY - PAP: Diagnosis: NEGATIVE

## 2020-07-10 ENCOUNTER — Other Ambulatory Visit: Payer: Self-pay | Admitting: Obstetrics

## 2020-07-30 ENCOUNTER — Ambulatory Visit: Payer: Medicaid Other | Admitting: Obstetrics

## 2020-08-07 ENCOUNTER — Other Ambulatory Visit: Payer: Self-pay

## 2020-08-07 ENCOUNTER — Inpatient Hospital Stay (HOSPITAL_COMMUNITY)
Admission: AD | Admit: 2020-08-07 | Discharge: 2020-08-07 | Disposition: A | Discharge status: Home / Self Care | Payer: Medicaid Other | Attending: Obstetrics & Gynecology | Admitting: Obstetrics & Gynecology

## 2020-08-07 DIAGNOSIS — Z3202 Encounter for pregnancy test, result negative: Secondary | ICD-10-CM | POA: Insufficient documentation

## 2020-08-07 DIAGNOSIS — N939 Abnormal uterine and vaginal bleeding, unspecified: Secondary | ICD-10-CM | POA: Diagnosis not present

## 2020-08-07 DIAGNOSIS — R109 Unspecified abdominal pain: Secondary | ICD-10-CM | POA: Insufficient documentation

## 2020-08-07 LAB — POCT PREGNANCY, URINE: Preg Test, Ur: NEGATIVE

## 2020-08-07 NOTE — MAU Note (Signed)
Presents with c/o lower abdominal pain that's sharp, tender, and aching.  Also states has VB with wiping.  Denies LOF.  Unsure of gestational age, no PNC.

## 2020-08-07 NOTE — MAU Provider Note (Signed)
Event Date/Time   First Provider Initiated Contact with Patient 08/07/20 1219      S [name] is a 30 y.o. [id number] patient who presents to MAU today with complaint of cramping and vaginal bleeding only when wiping after using the bathroom. Patient has an appointment at [facility] scheduled 08/13/2020. Patient has no other concerns today.  O BP (!) 131/95 (BP Location: Right Arm)   Pulse 86   Temp 98.3 F (36.8 C) (Oral)   Resp 20   Ht 5\' 2"  (1.575 m)   Wt 72.5 kg   BMI 29.25 kg/m    Patient Vitals for the past 24 hrs:  BP Temp Temp src Pulse Resp Height Weight  08/07/20 1150 (!) 131/95 98.3 F (36.8 C) Oral 86 20 -- --  08/07/20 1143 -- -- -- -- -- 5\' 2"  (1.575 m) 72.5 kg   Physical Exam Vitals and nursing note reviewed.  Constitutional:      General: She is not in acute distress.    Appearance: Normal appearance. She is not ill-appearing, toxic-appearing or diaphoretic.  HENT:     Head: Normocephalic and atraumatic.  Pulmonary:     Effort: Pulmonary effort is normal.  Neurological:     Mental Status: She is alert and oriented to person, place, and time.  Psychiatric:        Mood and Affect: Mood normal.        Behavior: Behavior normal.        Thought Content: Thought content normal.        Judgment: Judgment normal.    A Medical screening exam complete UPT negative in MAU  P Discharge from MAU in stable condition Patient given the option of transfer to [facility] for further evaluation or seek care in outpatient facility of choice List of options for follow-up given Warning signs for worsening condition that would warrant emergency follow-up discussed Patient may return to MAU as needed   Whitt Auletta, Gerrie Nordmann, NP 08/07/2020 12:27 PM

## 2020-08-13 ENCOUNTER — Ambulatory Visit: Payer: Medicaid Other | Admitting: Obstetrics

## 2020-08-18 ENCOUNTER — Other Ambulatory Visit: Payer: Self-pay

## 2020-08-18 ENCOUNTER — Ambulatory Visit (INDEPENDENT_AMBULATORY_CARE_PROVIDER_SITE_OTHER): Payer: Medicaid Other | Admitting: Obstetrics

## 2020-08-18 ENCOUNTER — Ambulatory Visit: Payer: Medicaid Other | Admitting: Obstetrics

## 2020-08-18 ENCOUNTER — Encounter: Payer: Self-pay | Admitting: Obstetrics

## 2020-08-18 VITALS — BP 132/90 | HR 83 | Ht 61.0 in | Wt 157.0 lb

## 2020-08-18 DIAGNOSIS — Z3169 Encounter for other general counseling and advice on procreation: Secondary | ICD-10-CM | POA: Diagnosis not present

## 2020-08-18 DIAGNOSIS — E139 Other specified diabetes mellitus without complications: Secondary | ICD-10-CM | POA: Diagnosis not present

## 2020-08-18 MED ORDER — VITAFOL ULTRA 29-0.6-0.4-200 MG PO CAPS: 1.0000 | ORAL_CAPSULE | Freq: Every day | ORAL | 4 refills | Status: DC

## 2020-08-18 MED ORDER — METFORMIN HCL ER 500 MG PO TB24
ORAL_TABLET | ORAL | 11 refills | Status: DC
Start: 2020-08-18 — End: 2021-08-11

## 2020-08-18 NOTE — Progress Notes (Signed)
Patient wants to discuss trying to conceive.

## 2020-08-18 NOTE — Progress Notes (Signed)
Patient ID: Stephanie Mack, female   DOB: 1991-01-29, 30 y.o.   MRN: [id number]  Chief Complaint  Patient presents with  . Trying to conceive    HPI Stephanie Mack is a 30 y.o. female.  Patient presents for preconception consultation.  She states that she would like to conceive over the next year, HPI  Past Medical History:  Diagnosis Date  . ADHD (attention deficit hyperactivity disorder)   . Anemia   . Anxiety   . Bipolar 1 disorder ([place])   . Chlamydia 05-31-10  . Chronic constipation   . Depression    depression  . GERD (gastroesophageal reflux disease)    with pregnancy  . Gestational diabetes    current pregnancy  . Gonorrhea contact, treated   . Headache   . Infection    UTI  . Pregnancy induced hypertension    previous pregnancy  . Pseudocyesis 2013   Seen in MAU for percieved FM, abd distension. Normal exam.   . Sickle cell trait ([place])   . Type 2 diabetes mellitus ([place]) 02/13/2017    Past Surgical History:  Procedure Laterality Date  . CESAREAN SECTION N/A 09/01/2012   Procedure:  Primary cesarean section with delivery of baby girl at 11.  Apgars 1/1.  ;  Surgeon: Frederico Hamman, MD;  Location: [facility];  Service: Obstetrics;  Laterality: N/A;  . CESAREAN SECTION N/A 01/07/2015   Procedure: REPEAT CESAREAN SECTION;  Surgeon: Shelly Bombard, MD;  Location: [facility];  Service: Obstetrics;  Laterality: N/A;  . NASAL SEPTUM SURGERY    . WISDOM TOOTH EXTRACTION      Family History  Problem Relation Age of Onset  . Kidney disease Mother   . Hypertension Father   . Cancer Sister   . Asthma Brother   . Diabetes Maternal Grandfather   . Heart disease Neg Hx     Social History Social History   Tobacco Use  . Smoking status: Never Smoker  . Smokeless tobacco: Never Used  Vaping Use  . Vaping Use: Never used  Substance Use Topics  . Alcohol use: No    Alcohol/week: 0.0 standard drinks  . Drug use: No    No Known Allergies  Current Outpatient  Medications  Medication Sig Dispense Refill  . ACCU-CHEK FASTCLIX LANCETS MISC TEST UPTO 4 TIMES DAILY 102 each 3  . ARIPiprazole (ABILIFY) 20 MG tablet Take 20 mg by mouth daily. Sees mental health. Unsure of dosage    . blood glucose meter kit and supplies KIT Dispense based on patient and insurance preference. Use up to four times daily as directed. (FOR ICD-9 250.00, 250.01). 1 each 0  . glucose blood test strip TEST UPTO 4 TIMES DAILY 100 each 3  . halobetasol (ULTRAVATE) 0.05 % ointment Apply topically 2 (two) times daily. To scalp only, avoid face, eyes, and mouth. Use only for 2 weeks and stop. 50 g 0  . ketoconazole (NIZORAL) 2 % shampoo Apply 1 application topically daily. To scalp 120 mL 0  . Ketoconazole 2 % GEL Apply 1 Squirt topically in the morning and at bedtime. For two weeks to left arm. 45 g 0  . metFORMIN (GLUCOPHAGE-XR) 500 MG 24 hr tablet Take 2 tablets po daily after breakfast and after supper. 120 tablet 11  . propranolol (INDERAL) 10 MG tablet Take 1 tablet (10 mg total) by mouth daily. 14 tablet 0  . cetirizine (ZYRTEC ALLERGY) 10 MG tablet Take 1 tablet (10 mg total) by mouth  daily. (Patient not taking: Reported on 08/18/2020) 30 tablet 0  . famotidine (PEPCID) 20 MG tablet Take 1 tablet (20 mg total) by mouth 2 (two) times daily. (Patient not taking: No sig reported) 60 tablet 3  . naproxen (NAPROSYN) 500 MG tablet Take 1 tablet (500 mg total) by mouth 2 (two) times daily with a meal. (Patient not taking: No sig reported) 30 tablet 0  . Prenat-Fe Poly-Methfol-FA-DHA (VITAFOL ULTRA) 29-0.6-0.4-200 MG CAPS Take 1 capsule by mouth daily before breakfast. 90 capsule 4   Current Facility-Administered Medications  Medication Dose Route Frequency Provider Last Rate Last Admin  . medroxyPROGESTERone (DEPO-PROVERA) injection 150 mg  150 mg Intramuscular Q90 days Woodroe Mode, MD   150 mg at 11/11/19 1134    Review of Systems Review of Systems Constitutional: negative for  fatigue and weight loss Respiratory: negative for cough and wheezing Cardiovascular: negative for chest pain, fatigue and palpitations Gastrointestinal: negative for abdominal pain and change in bowel habits Genitourinary:negative Integument/breast: negative for nipple discharge Musculoskeletal:negative for myalgias Neurological: negative for gait problems and tremors Behavioral/Psych: negative for abusive relationship, depression Endocrine: negative for temperature intolerance      Blood pressure 132/90, pulse 83, height _0  (1.549 m), weight 157 lb (71.2 kg), last menstrual period 08/07/2020.  Physical Exam Physical Exam General:   alert and no distress  Skin:   no rash or abnormalities  Lungs:   clear to auscultation bilaterally  Heart:   regular rate and rhythm, S1, S2 normal, no murmur, click, rub or gallop  The remainder of the physical exam deferred due to the nature of the encounter being consultation.  I have spent a total of 15 minutes of face-to-face time, excluding clinical staff time, reviewing notes and preparing to see patient, ordering tests and/or medications, and counseling the patient.  Data Reviewed Labs  Assessment     1. Encounter for preconception consultation - importance of healthy lifestyle, diet, exercise,  and intake of folic acid for neural development stressed Rx: - Prenat-Fe Poly-Methfol-FA-DHA (VITAFOL ULTRA) 29-0.6-0.4-200 MG CAPS; Take 1 capsule by mouth daily before breakfast.  Dispense: 90 capsule; Refill: 4  2. Diabetes 1.5, managed as type 2 ([place]) Rx: - metFORMIN (GLUCOPHAGE-XR) 500 MG 24 hr tablet; Take 2 tablets po daily after breakfast and after supper.  Dispense: 120 tablet; Refill: 11    Plan   Follow up prn   Meds ordered this encounter  Medications  . metFORMIN (GLUCOPHAGE-XR) 500 MG 24 hr tablet    Sig: Take 2 tablets po daily after breakfast and after supper.    Dispense:  120 tablet    Refill:  11  . Prenat-Fe  Poly-Methfol-FA-DHA (VITAFOL ULTRA) 29-0.6-0.4-200 MG CAPS    Sig: Take 1 capsule by mouth daily before breakfast.    Dispense:  90 capsule    Refill:  4      Shelly Bombard, MD 08/18/2020 12:39 PM

## 2020-08-29 ENCOUNTER — Other Ambulatory Visit: Payer: Self-pay | Admitting: Obstetrics

## 2020-09-15 ENCOUNTER — Telehealth: Payer: Self-pay | Admitting: Obstetrics

## 2021-01-19 ENCOUNTER — Ambulatory Visit (HOSPITAL_COMMUNITY)
Admission: EM | Admit: 2021-01-19 | Discharge: 2021-01-19 | Disposition: A | Discharge status: Home / Self Care | Payer: Medicaid Other | Attending: Student | Admitting: Student

## 2021-01-19 ENCOUNTER — Other Ambulatory Visit: Payer: Self-pay

## 2021-01-19 ENCOUNTER — Encounter (HOSPITAL_COMMUNITY): Payer: Self-pay | Admitting: Emergency Medicine

## 2021-01-19 DIAGNOSIS — E1169 Type 2 diabetes mellitus with other specified complication: Secondary | ICD-10-CM | POA: Diagnosis not present

## 2021-01-19 DIAGNOSIS — R112 Nausea with vomiting, unspecified: Secondary | ICD-10-CM | POA: Diagnosis not present

## 2021-01-19 DIAGNOSIS — J069 Acute upper respiratory infection, unspecified: Secondary | ICD-10-CM | POA: Diagnosis not present

## 2021-01-19 DIAGNOSIS — Z794 Long term (current) use of insulin: Secondary | ICD-10-CM

## 2021-01-19 LAB — CBG MONITORING, ED: Glucose-Capillary: 133 mg/dL — ABNORMAL HIGH (ref 70–99)

## 2021-01-19 MED ORDER — ONDANSETRON 8 MG PO TBDP: 8.0000 mg | ORAL_TABLET | Freq: Three times a day (TID) | ORAL | 0 refills | Status: DC | PRN

## 2021-01-19 MED ORDER — PROMETHAZINE-DM 6.25-15 MG/5ML PO SYRP: 5.0000 mL | ORAL_SOLUTION | Freq: Four times a day (QID) | ORAL | 0 refills | Status: DC | PRN

## 2021-01-19 NOTE — ED Triage Notes (Addendum)
On Thursday started having uri symptoms.  Cough, runny/stuffy nose , light headed, 2 episodes of vomiting today.  Patient is a diabetic/ takes metformin.   Did not check cbg today

## 2021-01-19 NOTE — Discharge Instructions (Addendum)
-  Promethazine DM cough syrup for congestion/cough. This could make you drowsy, so take at night before bed. -Take the Zofran (ondansetron) up to 3 times daily for nausea and vomiting. Dissolve one pill under your tongue or between your teeth and your cheek. -With a virus, you're typically contagious for 5-7 days, or as long as you're having fevers.  -Tylenol/ibuprofen
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
## 2021-02-20 ENCOUNTER — Other Ambulatory Visit: Payer: Self-pay

## 2021-02-20 ENCOUNTER — Inpatient Hospital Stay (HOSPITAL_COMMUNITY)
Admission: AD | Admit: 2021-02-20 | Discharge: 2021-02-20 | Disposition: A | Discharge status: Home / Self Care | Payer: Medicaid Other | Attending: Obstetrics & Gynecology | Admitting: Obstetrics & Gynecology

## 2021-02-20 ENCOUNTER — Inpatient Hospital Stay (HOSPITAL_COMMUNITY): Payer: Medicaid Other

## 2021-02-20 ENCOUNTER — Encounter (HOSPITAL_COMMUNITY): Payer: Self-pay | Admitting: Family Medicine

## 2021-02-20 DIAGNOSIS — Z3A01 Less than 8 weeks gestation of pregnancy: Secondary | ICD-10-CM

## 2021-02-20 DIAGNOSIS — O26891 Other specified pregnancy related conditions, first trimester: Secondary | ICD-10-CM | POA: Insufficient documentation

## 2021-02-20 DIAGNOSIS — O3680X0 Pregnancy with inconclusive fetal viability, not applicable or unspecified: Secondary | ICD-10-CM

## 2021-02-20 DIAGNOSIS — O26899 Other specified pregnancy related conditions, unspecified trimester: Secondary | ICD-10-CM

## 2021-02-20 LAB — CBC
HCT: 38.2 % (ref 36.0–46.0)
Hemoglobin: 12.4 g/dL (ref 12.0–15.0)
MCH: 25.7 pg — ABNORMAL LOW (ref 26.0–34.0)
MCHC: 32.5 g/dL (ref 30.0–36.0)
MCV: 79.3 fL — ABNORMAL LOW (ref 80.0–100.0)
Platelets: 288 10*3/uL (ref 150–400)
RBC: 4.82 MIL/uL (ref 3.87–5.11)
RDW: 14.1 % (ref 11.5–15.5)
WBC: 7.9 10*3/uL (ref 4.0–10.5)
nRBC: 0 % (ref 0.0–0.2)

## 2021-02-20 LAB — URINALYSIS, ROUTINE W REFLEX MICROSCOPIC
Bilirubin Urine: NEGATIVE
Glucose, UA: NEGATIVE mg/dL
Hgb urine dipstick: NEGATIVE
Ketones, ur: NEGATIVE mg/dL
Leukocytes,Ua: NEGATIVE
Nitrite: NEGATIVE
Protein, ur: NEGATIVE mg/dL
Specific Gravity, Urine: 1.019 (ref 1.005–1.030)
pH: 6 (ref 5.0–8.0)

## 2021-02-20 LAB — WET PREP, GENITAL
Sperm: NONE SEEN
Trich, Wet Prep: NONE SEEN
WBC, Wet Prep HPF POC: >=10 — AB (ref <10)
Yeast Wet Prep HPF POC: NONE SEEN

## 2021-02-20 LAB — HCG, QUANTITATIVE, PREGNANCY: hCG, Beta Chain, Quant, S: 3572 m[IU]/mL — ABNORMAL HIGH (ref <5)

## 2021-02-20 NOTE — MAU Note (Signed)
Pt reports has been constipated, they told her some thing to do but she can't remember.  Safe med list given, meds for constipation discussed and high lighted. . Pt collected vag swabs.  Waiting on blood work.

## 2021-02-20 NOTE — Discharge Instructions (Signed)

## 2021-02-20 NOTE — MAU Note (Addendum)
Pregnancy was confirmed at [facility] on 11/17. Has letter with her.  Pt thinks she is further along, 'seems bigger'.  Just wanting to make sure. Having pain in lower abd, mild cramping- comes and goes, started today. No bleeding.

## 2021-02-20 NOTE — MAU Provider Note (Signed)
History     CSN: [id number]  Arrival date and time: 02/20/21 1207   Event Date/Time   First Provider Initiated Contact with Patient 02/20/21 1515      Chief Complaint  Patient presents with   Abdominal Pain   HPI Stephanie Mack is a 30 y.o. [id number] at [redacted]w[redacted]d who presents with abdominal pain. She reports it is cramping and she rates the pain a 5/10 when it comes. She has not tried anything for the pain. She denies any bleeding or discharge. She states she is here because she thinks she is farther along in her pregnancy than [facility] told her she is.   OB History     Gravida  4   Para  2   Term  0   Preterm  2   AB  1   Living  1      SAB  1   IAB  0   Ectopic  0   Multiple  0   Live Births  2           Past Medical History:  Diagnosis Date   ADHD (attention deficit hyperactivity disorder)    Anemia    Anxiety    Bipolar 1 disorder ([place])    Chlamydia 05-31-10   Chronic constipation    Depression    depression   GERD (gastroesophageal reflux disease)    with pregnancy   Gestational diabetes    current pregnancy   Gonorrhea contact, treated    Headache    Infection    UTI   Pregnancy induced hypertension    previous pregnancy   Pseudocyesis 2013   Seen in MAU for percieved FM, abd distension. Normal exam.    Sickle cell trait ([place])    Type 2 diabetes mellitus ([place]) 02/13/2017    Past Surgical History:  Procedure Laterality Date   CESAREAN SECTION N/A 09/01/2012   Procedure:  Primary cesarean section with delivery of baby girl at 34.  Apgars 1/1.  ;  Surgeon: Frederico Hamman, MD;  Location: [facility];  Service: Obstetrics;  Laterality: N/A;   CESAREAN SECTION N/A 01/07/2015   Procedure: REPEAT CESAREAN SECTION;  Surgeon: Shelly Bombard, MD;  Location: [facility];  Service: Obstetrics;  Laterality: N/A;   NASAL SEPTUM SURGERY     WISDOM TOOTH EXTRACTION      Family History  Problem Relation Age of Onset   Kidney disease Mother    Hypertension  Father    Cancer Sister    Asthma Brother    Diabetes Maternal Grandfather    Heart disease Neg Hx     Social History   Tobacco Use   Smoking status: Never   Smokeless tobacco: Never  Vaping Use   Vaping Use: Never used  Substance Use Topics   Alcohol use: No    Alcohol/week: 0.0 standard drinks   Drug use: No    Allergies: No Known Allergies  No medications prior to admission.    Review of Systems  Constitutional: Negative.  Negative for fatigue and fever.  HENT: Negative.    Respiratory: Negative.  Negative for shortness of breath.   Cardiovascular: Negative.  Negative for chest pain.  Gastrointestinal:  Positive for abdominal pain. Negative for constipation, diarrhea, nausea and vomiting.  Genitourinary: Negative.  Negative for dysuria and vaginal bleeding.  Neurological: Negative.  Negative for dizziness and headaches.  Physical Exam   Blood pressure 134/85, pulse 94, temperature 98.8 F (37.1 C), temperature source Oral, resp.  rate 18, height 5\' 2"  (1.575 m), weight 68.9 kg, last menstrual period 01/21/2021, SpO2 100 %.  Physical Exam Vitals and nursing note reviewed.  Constitutional:      General: She is not in acute distress.    Appearance: She is well-developed.  HENT:     Head: Normocephalic.  Eyes:     Pupils: Pupils are equal, round, and reactive to light.  Cardiovascular:     Rate and Rhythm: Normal rate and regular rhythm.     Heart sounds: Normal heart sounds.  Pulmonary:     Effort: Pulmonary effort is normal. No respiratory distress.     Breath sounds: Normal breath sounds.  Abdominal:     General: Bowel sounds are normal. There is no distension.     Palpations: Abdomen is soft.     Tenderness: There is no abdominal tenderness.  Skin:    General: Skin is warm and dry.  Neurological:     Mental Status: She is alert and oriented to person, place, and time.  Psychiatric:        Mood and Affect: Mood normal.        Behavior: Behavior normal.         Thought Content: Thought content normal.        Judgment: Judgment normal.    MAU Course  Procedures Results for orders placed or performed during the hospital encounter of 02/20/21 (from the past 24 hour(s))  Urinalysis, Routine w reflex microscopic Urine, Clean Catch     Status: Abnormal   Collection Time: 02/20/21 12:29 PM  Result Value Ref Range   Color, Urine YELLOW YELLOW   APPearance HAZY (A) CLEAR   Specific Gravity, Urine 1.019 1.005 - 1.030   pH 6.0 5.0 - 8.0   Glucose, UA NEGATIVE NEGATIVE mg/dL   Hgb urine dipstick NEGATIVE NEGATIVE   Bilirubin Urine NEGATIVE NEGATIVE   Ketones, ur NEGATIVE NEGATIVE mg/dL   Protein, ur NEGATIVE NEGATIVE mg/dL   Nitrite NEGATIVE NEGATIVE   Leukocytes,Ua NEGATIVE NEGATIVE  hCG, quantitative, pregnancy     Status: Abnormal   Collection Time: 02/20/21 12:29 PM  Result Value Ref Range   hCG, Beta Chain, Quant, S 3,572 (H) <5 mIU/mL  Wet prep, genital     Status: Abnormal   Collection Time: 02/20/21 12:30 PM   Specimen: Vaginal  Result Value Ref Range   Yeast Wet Prep HPF POC NONE SEEN NONE SEEN   Trich, Wet Prep NONE SEEN NONE SEEN   Clue Cells Wet Prep HPF POC PRESENT (A) NONE SEEN   WBC, Wet Prep HPF POC >=10 (A) <10   Sperm NONE SEEN   CBC     Status: Abnormal   Collection Time: 02/20/21 12:50 PM  Result Value Ref Range   WBC 7.9 4.0 - 10.5 K/uL   RBC 4.82 3.87 - 5.11 MIL/uL   Hemoglobin 12.4 12.0 - 15.0 g/dL   HCT 38.2 36.0 - 46.0 %   MCV 79.3 (L) 80.0 - 100.0 fL   MCH 25.7 (L) 26.0 - 34.0 pg   MCHC 32.5 30.0 - 36.0 g/dL   RDW 14.1 11.5 - 15.5 %   Platelets 288 150 - 400 K/uL   nRBC 0.0 0.0 - 0.2 %    MDM UA, UPT CBC, HCG ABO/Rh- B Neg US OB Comp Less 14 weeks with Transvaginal  Patient has appointment Monday for swabs with pelvic  Consulted with Dr. Kennon Rounds- ok to have patient keep scheduled appointment in 48 hours for  repeat labs  Discussed with client the diagnosis of pregnancy of unknown anatomic location.   Three possibilities of outcome are: a healthy pregnancy that is too early to see a yolk sac to confirm the pregnancy is in the uterus, a pregnancy that is not healthy and has not developed and will not develop, and an ectopic pregnancy that is in the abdomen that cannot be identified at this time.  And ectopic pregnancy can be a life threatening situation as a pregnancy needs to be in the uterus which is a muscle and can stretch to accommodate the growth of a pregnancy.  Other structures in the pelvis and abdomen as not muscular and do not stretch with the growth of a pregnancy.  Worst case scenario is that a structure ruptures with a growing pregnancy not in the uterus and and internal hemorrhage can be a life threatening situation.  We need to follow the progression of this pregnancy carefully.  We need to check another serum pregnancy hormone level to determine if the levels are rising appropriately  and to determine the next steps that are needed for you. Patient's questions were answered.   Assessment and Plan   1. Pregnancy of unknown anatomic location   2. Abdominal pain affecting pregnancy   3. [redacted] weeks gestation of pregnancy    -Discharge home in stable condition -Strict ectopic precautions discussed -Patient advised to follow-up with OB as scheduled Monday for repeat labs -Patient may return to MAU as needed or if her condition were to change or worsen   Wende Mott CNM 02/20/2021, 5:46 PM

## 2021-02-22 LAB — GC/CHLAMYDIA PROBE AMP (~~LOC~~) NOT AT ARMC
Chlamydia: NEGATIVE
Comment: NEGATIVE
Comment: NORMAL
Neisseria Gonorrhea: NEGATIVE

## 2021-03-01 ENCOUNTER — Other Ambulatory Visit: Payer: Self-pay

## 2021-03-01 ENCOUNTER — Inpatient Hospital Stay (HOSPITAL_COMMUNITY)
Admission: AD | Admit: 2021-03-01 | Discharge: 2021-03-01 | Disposition: A | Discharge status: Home / Self Care | Payer: Medicaid Other | Attending: Obstetrics & Gynecology | Admitting: Obstetrics & Gynecology

## 2021-03-01 DIAGNOSIS — Z3A01 Less than 8 weeks gestation of pregnancy: Secondary | ICD-10-CM

## 2021-03-01 DIAGNOSIS — Z3481 Encounter for supervision of other normal pregnancy, first trimester: Secondary | ICD-10-CM | POA: Insufficient documentation

## 2021-03-01 NOTE — MAU Provider Note (Signed)
   S Stephanie Mack is a 30 y.o. [phone] patient who presents to MAU today with complaint of wanting to know whether she was pregnant with twins. She denies abdominal pain, bleeding, nausea, vomiting.   O BP 134/75 (BP Location: Right Arm)   Pulse 92   Temp 98 F (36.7 C) (Oral)   Resp 19   Ht 5\' 2"  (1.575 m)   Wt 69.3 kg   LMP 01/21/2021   SpO2 100%   BMI 27.93 kg/m  Physical Exam Vitals reviewed.  Constitutional:      Appearance: Normal appearance.  Cardiovascular:     Rate and Rhythm: Normal rate and regular rhythm.  Pulmonary:     Effort: Pulmonary effort is normal.     Breath sounds: Normal breath sounds.  Abdominal:     General: Abdomen is flat.     Palpations: Abdomen is soft.  Skin:    Capillary Refill: Capillary refill takes less than 2 seconds.  Neurological:     General: No focal deficit present.     Mental Status: She is alert.  Psychiatric:        Mood and Affect: Mood normal.        Behavior: Behavior normal.        Thought Content: Thought content normal.        Judgment: Judgment normal.    A Medical screening exam complete 1. [redacted] weeks gestation of pregnancy    P Discharge from MAU in stable condition Unfortunately, [redacted] weeks gestation is not the best time for [place] for twin gestation No emergent concerns identified.  Patient may return to MAU as needed   Truett Mainland, DO 03/01/2021 2:15 PM

## 2021-03-01 NOTE — MAU Note (Signed)
Presents stating she wants to verify gestational number, "just want to see how many I'm having".  Denies pain or VB.

## 2021-03-12 ENCOUNTER — Inpatient Hospital Stay (HOSPITAL_COMMUNITY)
Admission: AD | Admit: 2021-03-12 | Discharge: 2021-03-13 | Disposition: A | Discharge status: Home / Self Care | Payer: Medicaid Other | Attending: Obstetrics and Gynecology | Admitting: Obstetrics and Gynecology

## 2021-03-12 ENCOUNTER — Other Ambulatory Visit: Payer: Self-pay

## 2021-03-12 DIAGNOSIS — Z3A01 Less than 8 weeks gestation of pregnancy: Secondary | ICD-10-CM | POA: Insufficient documentation

## 2021-03-12 DIAGNOSIS — O26891 Other specified pregnancy related conditions, first trimester: Secondary | ICD-10-CM | POA: Insufficient documentation

## 2021-03-12 DIAGNOSIS — R519 Headache, unspecified: Secondary | ICD-10-CM | POA: Insufficient documentation

## 2021-03-12 NOTE — MAU Note (Signed)
..  Stephanie Mack is a 30 y.o. at [redacted]w[redacted]d here in MAU reporting: headache that began around 7pm, took tylenol right after it started for it but it has not helped. Denies vaginal bleeding or abdominal pain.  Pain score: 8/10 Vitals:   03/12/21 2234  BP: 127/84  Pulse: 91  Resp: 18  Temp: 98.4 F (36.9 C)  SpO2: 100%

## 2021-03-13 ENCOUNTER — Encounter: Payer: Self-pay | Admitting: Student

## 2021-03-13 DIAGNOSIS — R519 Headache, unspecified: Secondary | ICD-10-CM | POA: Diagnosis not present

## 2021-03-13 DIAGNOSIS — O26891 Other specified pregnancy related conditions, first trimester: Secondary | ICD-10-CM

## 2021-03-13 DIAGNOSIS — Z3A01 Less than 8 weeks gestation of pregnancy: Secondary | ICD-10-CM | POA: Diagnosis not present

## 2021-03-13 LAB — URINALYSIS, ROUTINE W REFLEX MICROSCOPIC
Bilirubin Urine: NEGATIVE
Glucose, UA: NEGATIVE mg/dL
Ketones, ur: NEGATIVE mg/dL
Leukocytes,Ua: NEGATIVE
Nitrite: NEGATIVE
Protein, ur: NEGATIVE mg/dL
Specific Gravity, Urine: >1.03 — ABNORMAL HIGH (ref 1.005–1.030)
pH: 6 (ref 5.0–8.0)

## 2021-03-13 LAB — URINALYSIS, MICROSCOPIC (REFLEX)

## 2021-03-13 MED ORDER — BUTALBITAL-APAP-CAFFEINE 50-325-40 MG PO TABS
2.0000 | ORAL_TABLET | Freq: Once | ORAL | Status: AC
  Administered 2021-03-13: 2 via ORAL
  Filled 2021-03-13: qty 2

## 2021-03-13 NOTE — MAU Provider Note (Signed)
History     CSN: [id number]  Arrival date and time: 03/12/21 2105   Event Date/Time   First Provider Initiated Contact with Patient 03/13/21 0027      Chief Complaint  Patient presents with   Headache   Stephanie Mack is a 30 y.o. [id number] at 10w2dwho [facility]  She presents today for Headache.  [place] taking one extra strength tylenol shortly after.  She reports the headache is located in the frontal area and is intermittent.  She questions if the HA could be related to hunger or being active at a kids party today.    OB History     Gravida  4   Para  2   Term  0   Preterm  2   AB  1   Living  1      SAB  1   IAB  0   Ectopic  0   Multiple  0   Live Births  2           Past Medical History:  Diagnosis Date   ADHD (attention deficit hyperactivity disorder)    Anemia    Anxiety    Bipolar 1 disorder ([place]    Chlamydia 05-31-10   Chronic constipation    Depression    depression   GERD (gastroesophageal reflux disease)    with pregnancy   Gestational diabetes    current pregnancy   Gonorrhea contact, treated    Headache    Infection    UTI   Pregnancy induced hypertension    previous pregnancy   Pseudocyesis 2013   Seen in MAU for percieved FM, abd distension. Normal exam.    Sickle cell trait ([place]    Type 2 diabetes mellitus ([place] 02/13/2017    Past Surgical History:  Procedure Laterality Date   CESAREAN SECTION N/A 09/01/2012   Procedure:  Primary cesarean section with delivery of baby girl at 127  Apgars 1/1.  ;  Surgeon: BFrederico Hamman MD;  [facility];  Service: Obstetrics;  Laterality: N/A;   CESAREAN SECTION N/A 01/07/2015   Procedure: REPEAT CESAREAN SECTION;  Surgeon: CShelly Bombard MD;  [facility];  Service: Obstetrics;  Laterality: N/A;   NASAL SEPTUM SURGERY     WISDOM TOOTH EXTRACTION      Family History  Problem Relation Age of Onset   Kidney  disease Mother    Hypertension Father    Cancer Sister    Asthma Brother    Diabetes Maternal Grandfather    Heart disease Neg Hx     Social History   Tobacco Use   Smoking status: Never   Smokeless tobacco: Never  Vaping Use   Vaping Use: Never used  Substance Use Topics   Alcohol use: No    Alcohol/week: 0.0 standard drinks   Drug use: No    Allergies: No Known Allergies  Medications Prior to Admission  Medication Sig Dispense Refill Last Dose   ACCU-CHEK FASTCLIX LANCETS MISC TEST UPTO 4 TIMES DAILY 102 each 3    blood glucose meter kit and supplies KIT Dispense based on patient and insurance preference. Use up to four times daily as directed. (FOR ICD-9 250.00, 250.01). 1 each 0    cetirizine (ZYRTEC ALLERGY) 10 MG tablet Take 1 tablet (10 mg total) by mouth daily. (Patient not taking: Reported on 08/18/2020) 30 tablet 0    famotidine (PEPCID)  20 MG tablet Take 1 tablet (20 mg total) by mouth 2 (two) times daily. (Patient not taking: No sig reported) 60 tablet 3    glucose blood test strip TEST UPTO 4 TIMES DAILY 100 each 3    halobetasol (ULTRAVATE) 0.05 % ointment Apply topically 2 (two) times daily. To scalp only, avoid face, eyes, and mouth. Use only for 2 weeks and stop. 50 g 0    ketoconazole (NIZORAL) 2 % shampoo Apply 1 application topically daily. To scalp 120 mL 0    Ketoconazole 2 % GEL Apply 1 Squirt topically in the morning and at bedtime. For two weeks to left arm. 45 g 0    metFORMIN (GLUCOPHAGE-XR) 500 MG 24 hr tablet Take 2 tablets po daily after breakfast and after supper. 120 tablet 11    ondansetron (ZOFRAN ODT) 8 MG disintegrating tablet Take 1 tablet (8 mg total) by mouth every 8 (eight) hours as needed for nausea or vomiting. 20 tablet 0    Prenat-Fe Poly-Methfol-FA-DHA (VITAFOL ULTRA) 29-0.6-0.4-200 MG CAPS Take 1 capsule by mouth daily before breakfast. 90 capsule 4     Review of Systems  Eyes:  Negative for visual disturbance.  Gastrointestinal:   Negative for nausea and vomiting.  Genitourinary:  Negative for difficulty urinating, dysuria, vaginal bleeding and vaginal discharge.  Neurological:  Positive for headaches. Negative for dizziness and light-headedness.  Physical Exam   Blood pressure 127/84, pulse 91, temperature 98.4 F (36.9 C), temperature source Oral, resp. rate 18, height $RemoveBe'5\' 2"'EusfqWsor$  (1.575 m), weight 68.8 kg, last menstrual period 01/21/2021, SpO2 100 %.  Physical Exam Vitals reviewed.  Constitutional:      Appearance: Normal appearance. She is well-developed.  HENT:     Head: Normocephalic and atraumatic.  Eyes:     Conjunctiva/sclera: Conjunctivae normal.  Cardiovascular:     Rate and Rhythm: Normal rate.     Heart sounds: Normal heart sounds.  Pulmonary:     Effort: Pulmonary effort is normal. No respiratory distress.     Breath sounds: Normal breath sounds.  Abdominal:     General: There is distension.     Palpations: Abdomen is soft.     Comments: Appears gravid, but no fundus palpated  Musculoskeletal:        General: Normal range of motion.     Cervical back: Normal range of motion.  Skin:    General: Skin is warm and dry.  Neurological:     Mental Status: She is alert and oriented to person, place, and time.  Psychiatric:        Mood and Affect: Mood normal.        Behavior: Behavior normal.    MAU Course  Procedures Results for orders placed or performed during the hospital encounter of 03/12/21 (from the past 24 hour(s))  Urinalysis, Routine w reflex microscopic     Status: Abnormal   Collection Time: 03/13/21  1:05 AM  Result Value Ref Range   Color, Urine YELLOW YELLOW   APPearance CLEAR CLEAR   Specific Gravity, Urine >1.030 (H) 1.005 - 1.030   pH 6.0 5.0 - 8.0   Glucose, UA NEGATIVE NEGATIVE mg/dL   Hgb urine dipstick TRACE (A) NEGATIVE   Bilirubin Urine NEGATIVE NEGATIVE   Ketones, ur NEGATIVE NEGATIVE mg/dL   Protein, ur NEGATIVE NEGATIVE mg/dL   Nitrite NEGATIVE NEGATIVE    Leukocytes,Ua NEGATIVE NEGATIVE  Urinalysis, Microscopic (reflex)     Status: Abnormal   Collection Time: 03/13/21  1:05 AM  Result Value Ref Range   RBC / HPF 0-5 0 - 5 RBC/hpf   WBC, UA 0-5 0 - 5 WBC/hpf   Bacteria, UA RARE (A) NONE SEEN   Squamous Epithelial / LPF 0-5 0 - 5   Mucus PRESENT    Ca Oxalate Crys, UA PRESENT    Urine-Other LESS THAN 10 mL OF URINE SUBMITTED     MDM Pain Medication Labs: UA Assessment and Plan  30 year old, [id number]  SIUP at 7.2 weeks HA  -Reviewed POC with patient. -Exam performed.  -Discussed usage of OTC medications. -Will give pregnancy safe medication list upon discharge. -Reviewed usage of Fioricet dosing for current HA. -Patient agreeable.  Questions regarding BTL, metformin modifications, and next [place]. -Informed that she would need to follow up with primary ob provider to address this questions. -Patient without further questions.; -Will give medication and reassess.   Maryann Conners 03/13/2021, 12:27 AM   Reassessment (1:25 AM)  -Patient reports HA has improved. Rates a 3/10. -Discussed management at home. -Informed that script will not be sent.  -Encouraged to follow up with primary office as scheduled. -Bleeding precautions given. -Discharged to home in stable condition.  Maryann Conners MSN, CNM Advanced Practice Provider, [facility]

## 2021-03-31 LAB — OB RESULTS CONSOLE HIV ANTIBODY (ROUTINE TESTING): HIV: NONREACTIVE

## 2021-03-31 LAB — OB RESULTS CONSOLE GC/CHLAMYDIA
Chlamydia: NEGATIVE
Neisseria Gonorrhea: NEGATIVE

## 2021-03-31 LAB — HEPATITIS C ANTIBODY: HCV Ab: NEGATIVE

## 2021-03-31 LAB — OB RESULTS CONSOLE RPR: RPR: NONREACTIVE

## 2021-03-31 LAB — OB RESULTS CONSOLE ANTIBODY SCREEN: Antibody Screen: NEGATIVE

## 2021-03-31 LAB — OB RESULTS CONSOLE HEPATITIS B SURFACE ANTIGEN: Hepatitis B Surface Ag: NEGATIVE

## 2021-03-31 LAB — OB RESULTS CONSOLE RUBELLA ANTIBODY, IGM: Rubella: IMMUNE

## 2021-03-31 LAB — OB RESULTS CONSOLE ABO/RH: RH Type: NEGATIVE

## 2021-04-11 ENCOUNTER — Other Ambulatory Visit: Payer: Self-pay

## 2021-04-11 ENCOUNTER — Encounter (HOSPITAL_COMMUNITY): Payer: Self-pay | Admitting: Obstetrics and Gynecology

## 2021-04-11 ENCOUNTER — Inpatient Hospital Stay (HOSPITAL_COMMUNITY)
Admission: AD | Admit: 2021-04-11 | Discharge: 2021-04-11 | Disposition: A | Discharge status: Home / Self Care | Payer: Medicaid Other | Attending: Obstetrics and Gynecology | Admitting: Obstetrics and Gynecology

## 2021-04-11 DIAGNOSIS — R109 Unspecified abdominal pain: Secondary | ICD-10-CM | POA: Insufficient documentation

## 2021-04-11 DIAGNOSIS — O26891 Other specified pregnancy related conditions, first trimester: Secondary | ICD-10-CM | POA: Insufficient documentation

## 2021-04-11 DIAGNOSIS — O09299 Supervision of pregnancy with other poor reproductive or obstetric history, unspecified trimester: Secondary | ICD-10-CM

## 2021-04-11 DIAGNOSIS — O26899 Other specified pregnancy related conditions, unspecified trimester: Secondary | ICD-10-CM

## 2021-04-11 DIAGNOSIS — O09291 Supervision of pregnancy with other poor reproductive or obstetric history, first trimester: Secondary | ICD-10-CM | POA: Insufficient documentation

## 2021-04-11 DIAGNOSIS — Z3A12 12 weeks gestation of pregnancy: Secondary | ICD-10-CM

## 2021-04-11 LAB — URINALYSIS, ROUTINE W REFLEX MICROSCOPIC
Bilirubin Urine: NEGATIVE
Glucose, UA: NEGATIVE mg/dL
Hgb urine dipstick: NEGATIVE
Ketones, ur: NEGATIVE mg/dL
Nitrite: NEGATIVE
Protein, ur: NEGATIVE mg/dL
Specific Gravity, Urine: >1.03 — ABNORMAL HIGH (ref 1.005–1.030)
pH: 6 (ref 5.0–8.0)

## 2021-04-11 LAB — URINALYSIS, MICROSCOPIC (REFLEX)

## 2021-04-11 NOTE — MAU Note (Signed)
Stephanie Mack is a 31 y.o. at [redacted]w[redacted]d here in MAU reporting: Lower abdominal pain starting at [place]. LMP: unknown Onset of complaint: 1830 Pain score: 7/10 Vitals:   04/11/21 1920  BP: 125/75  Pulse: 79  Resp: 16  Temp: 98.8 F (37.1 C)  SpO2: 100%     FHT:174 Lab orders placed from triage:

## 2021-04-11 NOTE — MAU Provider Note (Signed)
Chief Complaint: Abdominal Pain   Event Date/Time   First Provider Initiated Contact with Patient 04/11/21 1928      SUBJECTIVE HPI: Stephanie Mack is a 31 y.o. [id number] at 56w5dby early ultrasound in the office who presents to maternity admissions via EMS reporting an episode of abdominal pain at home that had her worried. She has hx PTL x 2 with 25 week delivery with child that is not living and 36 week delivery with her healthy 31year old.  She reports that the pain in her low abdomen that was sharp and cramping earlier tonight has resolved. She denies pain in MAU.  There are no other symptoms. She has not tried any treatments.    HPI  Past Medical History:  Diagnosis Date   ADHD (attention deficit hyperactivity disorder)    Anemia    Anxiety    Bipolar 1 disorder ([place]    Chlamydia 05-31-10   Chronic constipation    Depression    depression   GERD (gastroesophageal reflux disease)    with pregnancy   Gestational diabetes    current pregnancy   Gonorrhea contact, treated    Headache    Infection    UTI   Pregnancy induced hypertension    previous pregnancy   Pseudocyesis 2013   Seen in MAU for percieved FM, abd distension. Normal exam.    Sickle cell trait ([place]    Type 2 diabetes mellitus ([place] 02/13/2017   Past Surgical History:  Procedure Laterality Date   CESAREAN SECTION N/A 09/01/2012   Procedure:  Primary cesarean section with delivery of baby girl at 129  Apgars 1/1.  ;  Surgeon: BFrederico Hamman MD;  [facility];  Service: Obstetrics;  Laterality: N/A;   CESAREAN SECTION N/A 01/07/2015   Procedure: REPEAT CESAREAN SECTION;  Surgeon: CShelly Bombard MD;  [facility];  Service: Obstetrics;  Laterality: N/A;   NASAL SEPTUM SURGERY     WISDOM TOOTH EXTRACTION     Social History   Socioeconomic History   Marital status: Single    Spouse name: Not on file   Number of children: Not on file   Years of education: Not on file   Highest education  level: Not on file  Occupational History   Not on file  Tobacco Use   Smoking status: Never   Smokeless tobacco: Never  Vaping Use   Vaping Use: Never used  Substance and Sexual Activity   Alcohol use: No    Alcohol/week: 0.0 standard drinks   Drug use: No   Sexual activity: Yes    Partners: Male    Birth control/protection: None  Other Topics Concern   Not on file  Social History Narrative   Single, one daughter born 2016          Social Determinants of Health   Financial Resource Strain: Not on file  Food Insecurity: Not on file  Transportation Needs: Not on file  Physical Activity: Not on file  Stress: Not on file  Social Connections: Not on file  Intimate Partner Violence: Not on file   No current facility-administered medications on file prior to encounter.   Current Outpatient Medications on File Prior to Encounter  Medication Sig Dispense Refill   ACCU-CHEK FASTCLIX LANCETS MISC TEST UPTO 4 TIMES DAILY 102 each 3   blood glucose meter kit and supplies KIT Dispense based on patient and insurance preference. Use up to four times daily as directed. (FOR ICD-9 250.00,  250.01). 1 each 0   glucose blood test strip TEST UPTO 4 TIMES DAILY 100 each 3   metFORMIN (GLUCOPHAGE-XR) 500 MG 24 hr tablet Take 2 tablets po daily after breakfast and after supper. 120 tablet 11   ondansetron (ZOFRAN ODT) 8 MG disintegrating tablet Take 1 tablet (8 mg total) by mouth every 8 (eight) hours as needed for nausea or vomiting. 20 tablet 0   Prenat-Fe Poly-Methfol-FA-DHA (VITAFOL ULTRA) 29-0.6-0.4-200 MG CAPS Take 1 capsule by mouth daily before breakfast. 90 capsule 4   No Known Allergies  ROS:  Review of Systems   I have reviewed patient's Past Medical Hx, Surgical Hx, Family Hx, Social Hx, medications and allergies.   Physical Exam  Patient Vitals for the past 24 hrs:  BP Temp Temp src Pulse Resp SpO2  04/11/21 1920 125/75 98.8 F (37.1 C) Oral 79 16 100 %   Constitutional:  Well-developed, well-nourished female in no acute distress.  Cardiovascular: normal rate Respiratory: normal effort GI: Abd soft, non-tender. Pos BS x 4 MS: Extremities nontender, no edema, normal ROM Neurologic: Alert and oriented x 4.  GU: Neg CVAT.  PELVIC EXAM: Deferred  FHT 174 by doppler  LAB RESULTS No results found for this or any previous visit (from the past 24 hour(s)).     IMAGING No results found.  MAU Management/MDM: Orders Placed This Encounter  Procedures   Urinalysis, Routine w reflex microscopic Urine, Clean Catch   Discharge patient    No orders of the defined types were placed in this encounter.   Pt reassured by fetal heart tones.  Pain resolved before arriving in MAU. Rest/ice/heat/warm bath/Tylenol if pain returns.  F/U with [facility] as scheduled. Return to MAU as needed for emergencies.   ASSESSMENT 1. Abdominal pain affecting pregnancy   2. Pregnancy with poor obstetric history   3. [redacted] weeks gestation of pregnancy     PLAN Discharge home Allergies as of 04/11/2021   No Known Allergies      Medication List     TAKE these medications    Accu-Chek FastClix Lancets Misc TEST UPTO 4 TIMES DAILY   blood glucose meter kit and supplies Kit Dispense based on patient and insurance preference. Use up to four times daily as directed. (FOR ICD-9 250.00, 250.01).   glucose blood test strip TEST UPTO 4 TIMES DAILY   metFORMIN 500 MG 24 hr tablet Commonly known as: GLUCOPHAGE-XR Take 2 tablets po daily after breakfast and after supper.   ondansetron 8 MG disintegrating tablet Commonly known as: Zofran ODT Take 1 tablet (8 mg total) by mouth every 8 (eight) hours as needed for nausea or vomiting.   Vitafol Ultra 29-0.6-0.4-200 MG Caps Take 1 capsule by mouth daily before breakfast.        [facility] Follow up.   Specialty: Obstetrics and Gynecology Why: As scheduled on 04/29/21 Contact  information: [address] [place] [address] [phone]        [facility] Follow up.   Specialty: Obstetrics and Gynecology Why: As needed for emergencies Contact information: [address] [id number] [place] Certified Nurse-Midwife 04/11/2021  7:50 PM

## 2021-04-22 ENCOUNTER — Encounter (HOSPITAL_COMMUNITY): Payer: Self-pay | Admitting: Obstetrics & Gynecology

## 2021-04-22 ENCOUNTER — Other Ambulatory Visit: Payer: Self-pay

## 2021-04-22 ENCOUNTER — Inpatient Hospital Stay (HOSPITAL_COMMUNITY)
Admission: AD | Admit: 2021-04-22 | Discharge: 2021-04-22 | Disposition: A | Discharge status: Home / Self Care | Payer: Medicaid Other | Attending: Obstetrics & Gynecology | Admitting: Obstetrics & Gynecology

## 2021-04-22 DIAGNOSIS — Z833 Family history of diabetes mellitus: Secondary | ICD-10-CM | POA: Diagnosis not present

## 2021-04-22 DIAGNOSIS — Z3A14 14 weeks gestation of pregnancy: Secondary | ICD-10-CM | POA: Insufficient documentation

## 2021-04-22 DIAGNOSIS — D649 Anemia, unspecified: Secondary | ICD-10-CM | POA: Diagnosis not present

## 2021-04-22 DIAGNOSIS — G44209 Tension-type headache, unspecified, not intractable: Secondary | ICD-10-CM | POA: Insufficient documentation

## 2021-04-22 DIAGNOSIS — O24112 Pre-existing diabetes mellitus, type 2, in pregnancy, second trimester: Secondary | ICD-10-CM | POA: Diagnosis not present

## 2021-04-22 DIAGNOSIS — Z7984 Long term (current) use of oral hypoglycemic drugs: Secondary | ICD-10-CM | POA: Diagnosis not present

## 2021-04-22 DIAGNOSIS — O99012 Anemia complicating pregnancy, second trimester: Secondary | ICD-10-CM | POA: Diagnosis not present

## 2021-04-22 DIAGNOSIS — Z79899 Other long term (current) drug therapy: Secondary | ICD-10-CM | POA: Insufficient documentation

## 2021-04-22 DIAGNOSIS — E119 Type 2 diabetes mellitus without complications: Secondary | ICD-10-CM | POA: Insufficient documentation

## 2021-04-22 DIAGNOSIS — Z8249 Family history of ischemic heart disease and other diseases of the circulatory system: Secondary | ICD-10-CM | POA: Insufficient documentation

## 2021-04-22 DIAGNOSIS — O99352 Diseases of the nervous system complicating pregnancy, second trimester: Secondary | ICD-10-CM | POA: Diagnosis present

## 2021-04-22 DIAGNOSIS — O219 Vomiting of pregnancy, unspecified: Secondary | ICD-10-CM | POA: Diagnosis not present

## 2021-04-22 DIAGNOSIS — O10912 Unspecified pre-existing hypertension complicating pregnancy, second trimester: Secondary | ICD-10-CM | POA: Diagnosis not present

## 2021-04-22 HISTORY — DX: Essential (primary) hypertension: I10

## 2021-04-22 LAB — URINALYSIS, ROUTINE W REFLEX MICROSCOPIC
Bilirubin Urine: NEGATIVE
Glucose, UA: NEGATIVE mg/dL
Hgb urine dipstick: NEGATIVE
Ketones, ur: NEGATIVE mg/dL
Leukocytes,Ua: NEGATIVE
Nitrite: NEGATIVE
Protein, ur: NEGATIVE mg/dL
Specific Gravity, Urine: 1.01 (ref 1.005–1.030)
pH: 6.5 (ref 5.0–8.0)

## 2021-04-22 LAB — CBC
HCT: 33.8 % — ABNORMAL LOW (ref 36.0–46.0)
Hemoglobin: 10.9 g/dL — ABNORMAL LOW (ref 12.0–15.0)
MCH: 25.5 pg — ABNORMAL LOW (ref 26.0–34.0)
MCHC: 32.2 g/dL (ref 30.0–36.0)
MCV: 79 fL — ABNORMAL LOW (ref 80.0–100.0)
Platelets: 238 10*3/uL (ref 150–400)
RBC: 4.28 MIL/uL (ref 3.87–5.11)
RDW: 13.3 % (ref 11.5–15.5)
WBC: 6.7 10*3/uL (ref 4.0–10.5)
nRBC: 0 % (ref 0.0–0.2)

## 2021-04-22 MED ORDER — METOCLOPRAMIDE HCL 10 MG PO TABS
10.0000 mg | ORAL_TABLET | Freq: Once | ORAL | Status: AC
  Administered 2021-04-22: 10 mg via ORAL
  Filled 2021-04-22: qty 1

## 2021-04-22 MED ORDER — METOCLOPRAMIDE HCL 10 MG PO TABS: 10.0000 mg | ORAL_TABLET | Freq: Four times a day (QID) | ORAL | 0 refills | Status: DC | PRN

## 2021-04-22 MED ORDER — DIPHENHYDRAMINE HCL 25 MG PO CAPS
25.0000 mg | ORAL_CAPSULE | Freq: Once | ORAL | Status: AC
  Administered 2021-04-22: 25 mg via ORAL
  Filled 2021-04-22: qty 1

## 2021-04-22 MED ORDER — DIPHENHYDRAMINE HCL 25 MG PO CAPS: 25.0000 mg | ORAL_CAPSULE | Freq: Four times a day (QID) | ORAL | 0 refills | Status: DC | PRN

## 2021-04-22 NOTE — MAU Provider Note (Signed)
History     CSN: [id number]  Arrival date and time: 04/22/21 1307   Event Date/Time   First Provider Initiated Contact with Patient 04/22/21 1354      Chief Complaint  Patient presents with   Headache   31 y.o. [id number] $RemoveBefo'@14'GwfaCcjYbid$ .2 wks presenting with HA. Reports onset yesterday. HA is located frontal on left. Rates pain 7/10. She took Tylenol 1g about 3 hrs ago and it helped some. Denies visual disturbances. Reports frequent HAs when she wasn't pregnant. Hasn't been evaluated for, usually resolves with Tylenol and sleep. Reports occasional vomiting with certain foods or smells over the last 2 wks. She had pizza earlier and threw up. She had yogurt after that and tolerated. She's tolerating po liquids w/o difficulty. Has used Zofran in past but states it didn't help. Denies abd pain or VB.    OB History     Gravida  4   Para  2   Term  0   Preterm  2   AB  1   Living  1      SAB  1   IAB  0   Ectopic  0   Multiple  0   Live Births  2           Past Medical History:  Diagnosis Date   ADHD (attention deficit hyperactivity disorder)    Anemia    Anxiety    Bipolar 1 disorder ([place])    Chlamydia 05/31/2010   Chronic constipation    Depression    depression   GERD (gastroesophageal reflux disease)    with pregnancy   Gestational diabetes    current pregnancy   Gonorrhea contact, treated    Headache    Hypertension    Infection    UTI   Pregnancy induced hypertension    previous pregnancy   Pseudocyesis 2013   Seen in MAU for percieved FM, abd distension. Normal exam.    Sickle cell trait ([place])    Type 2 diabetes mellitus ([place]) 02/13/2017    Past Surgical History:  Procedure Laterality Date   CESAREAN SECTION N/A 09/01/2012   Procedure:  Primary cesarean section with delivery of baby girl at 42.  Apgars 1/1.  ;  Surgeon: Frederico Hamman, MD;  Location: [facility];  Service: Obstetrics;  Laterality: N/A;   CESAREAN SECTION N/A 01/07/2015   Procedure:  REPEAT CESAREAN SECTION;  Surgeon: Shelly Bombard, MD;  Location: [facility];  Service: Obstetrics;  Laterality: N/A;   NASAL SEPTUM SURGERY     WISDOM TOOTH EXTRACTION      Family History  Problem Relation Age of Onset   Kidney disease Mother    Hypertension Father    Cancer Sister    Asthma Brother    Diabetes Maternal Grandfather    Heart disease Neg Hx     Social History   Tobacco Use   Smoking status: Never   Smokeless tobacco: Never  Vaping Use   Vaping Use: Never used  Substance Use Topics   Alcohol use: No    Alcohol/week: 0.0 standard drinks   Drug use: No    Allergies: No Known Allergies  Medications Prior to Admission  Medication Sig Dispense Refill Last Dose   metFORMIN (GLUCOPHAGE-XR) 500 MG 24 hr tablet Take 2 tablets po daily after breakfast and after supper. 120 tablet 11 04/22/2021 at 1000   Prenat-Fe Poly-Methfol-FA-DHA (VITAFOL ULTRA) 29-0.6-0.4-200 MG CAPS Take 1 capsule by mouth daily before breakfast. 90 capsule 4  04/22/2021 at 1000   ACCU-CHEK FASTCLIX LANCETS MISC TEST UPTO 4 TIMES DAILY 102 each 3    blood glucose meter kit and supplies KIT Dispense based on patient and insurance preference. Use up to four times daily as directed. (FOR ICD-9 250.00, 250.01). 1 each 0    glucose blood test strip TEST UPTO 4 TIMES DAILY 100 each 3    ondansetron (ZOFRAN ODT) 8 MG disintegrating tablet Take 1 tablet (8 mg total) by mouth every 8 (eight) hours as needed for nausea or vomiting. 20 tablet 0     Review of Systems  Constitutional:  Negative for fever.  Eyes:  Negative for visual disturbance.  Gastrointestinal:  Positive for nausea and vomiting. Negative for abdominal pain.  Neurological:  Positive for headaches.  Physical Exam   Blood pressure 131/73, pulse (!) 102, temperature 98.4 F (36.9 C), temperature source Oral, resp. rate 16, height 5' 2"  (1.575 m), weight 68.8 kg, last menstrual period 01/21/2021, SpO2 99 %.  Physical Exam Vitals and nursing  note reviewed.  Constitutional:      General: She is not in acute distress.    Appearance: Normal appearance.  HENT:     Head: Normocephalic and atraumatic.  Pulmonary:     Effort: Pulmonary effort is normal. No respiratory distress.  Musculoskeletal:        General: Normal range of motion.     Cervical back: Normal range of motion.  Skin:    General: Skin is warm and dry.  Neurological:     General: No focal deficit present.     Mental Status: She is alert and oriented to person, place, and time.     Cranial Nerves: No cranial nerve deficit.     Deep Tendon Reflexes: Reflexes normal.  Psychiatric:        Mood and Affect: Mood normal.        Behavior: Behavior normal.  FHT 154  Results for orders placed or performed during the hospital encounter of 04/22/21 (from the past 24 hour(s))  Urinalysis, Routine w reflex microscopic Urine, Clean Catch     Status: None   Collection Time: 04/22/21  1:45 PM  Result Value Ref Range   Color, Urine YELLOW YELLOW   APPearance CLEAR CLEAR   Specific Gravity, Urine 1.010 1.005 - 1.030   pH 6.5 5.0 - 8.0   Glucose, UA NEGATIVE NEGATIVE mg/dL   Hgb urine dipstick NEGATIVE NEGATIVE   Bilirubin Urine NEGATIVE NEGATIVE   Ketones, ur NEGATIVE NEGATIVE mg/dL   Protein, ur NEGATIVE NEGATIVE mg/dL   Nitrite NEGATIVE NEGATIVE   Leukocytes,Ua NEGATIVE NEGATIVE  CBC     Status: Abnormal   Collection Time: 04/22/21  2:15 PM  Result Value Ref Range   WBC 6.7 4.0 - 10.5 K/uL   RBC 4.28 3.87 - 5.11 MIL/uL   Hemoglobin 10.9 (L) 12.0 - 15.0 g/dL   HCT 33.8 (L) 36.0 - 46.0 %   MCV 79.0 (L) 80.0 - 100.0 fL   MCH 25.5 (L) 26.0 - 34.0 pg   MCHC 32.2 30.0 - 36.0 g/dL   RDW 13.3 11.5 - 15.5 %   Platelets 238 150 - 400 K/uL   nRBC 0.0 0.0 - 0.2 %    MAU Course  Procedures Reglan Benadryl  MDM No prenatal records on file. Hx of HTN on Nifedipine and T2DM on Metformin. Labs ordered and reviewed. Normal neuro exam. HA improved with meds. Tolerating po.  Mild anemia noted. Stable for discharge home.  Assessment and Plan   1. [redacted] weeks gestation of pregnancy   2. Acute non intractable tension-type headache   3. Anemia during pregnancy in second trimester    Discharge home [facility] as scheduled Rx Reglan Rx Benadryl Continue Tylenol prn Notify OB provider if sx persist  Allergies as of 04/22/2021   No Known Allergies      Medication List     STOP taking these medications    ondansetron 8 MG disintegrating tablet Commonly known as: Zofran ODT       TAKE these medications    Accu-Chek FastClix Lancets Misc TEST UPTO 4 TIMES DAILY   blood glucose meter kit and supplies Kit Dispense based on patient and insurance preference. Use up to four times daily as directed. (FOR ICD-9 250.00, 250.01).   diphenhydrAMINE 25 mg capsule Commonly known as: BENADRYL Take 1 capsule (25 mg total) by mouth every 6 (six) hours as needed (headache or itching).   glucose blood test strip TEST UPTO 4 TIMES DAILY   metFORMIN 500 MG 24 hr tablet Commonly known as: GLUCOPHAGE-XR Take 2 tablets po daily after breakfast and after supper.   metoCLOPramide 10 MG tablet Commonly known as: Reglan Take 1 tablet (10 mg total) by mouth every 6 (six) hours as needed (headache or nausea).   Vitafol Ultra 29-0.6-0.4-200 MG Caps Take 1 capsule by mouth daily before breakfast.        Julianne Handler, CNM 04/22/2021, 3:35 PM

## 2021-04-22 NOTE — MAU Note (Signed)
Stephanie Mack is a 31 y.o. at [redacted]w[redacted]d here in MAU reporting: intermittent headache for the past couple of days. Tried 1 extra strength tylenol around 11, states it helped a little. No bleeding, no LOF or discharge.   Onset of complaint: ongoing  Pain score: 7/10  Vitals:   04/22/21 1323  BP: 126/76  Pulse: 97  Resp: 16  Temp: 98.4 F (36.9 C)  SpO2: 98%     Lab orders placed from triage: UA

## 2021-05-05 ENCOUNTER — Encounter (HOSPITAL_COMMUNITY): Payer: Self-pay | Admitting: Obstetrics and Gynecology

## 2021-05-05 ENCOUNTER — Inpatient Hospital Stay (HOSPITAL_COMMUNITY)
Admission: AD | Admit: 2021-05-05 | Discharge: 2021-05-05 | Disposition: A | Discharge status: Home / Self Care | Payer: Medicaid Other | Attending: Obstetrics and Gynecology | Admitting: Obstetrics and Gynecology

## 2021-05-05 DIAGNOSIS — R519 Headache, unspecified: Secondary | ICD-10-CM | POA: Diagnosis not present

## 2021-05-05 DIAGNOSIS — R1030 Lower abdominal pain, unspecified: Secondary | ICD-10-CM | POA: Insufficient documentation

## 2021-05-05 DIAGNOSIS — O2342 Unspecified infection of urinary tract in pregnancy, second trimester: Secondary | ICD-10-CM

## 2021-05-05 DIAGNOSIS — Z3A16 16 weeks gestation of pregnancy: Secondary | ICD-10-CM | POA: Insufficient documentation

## 2021-05-05 DIAGNOSIS — R109 Unspecified abdominal pain: Secondary | ICD-10-CM | POA: Diagnosis not present

## 2021-05-05 DIAGNOSIS — O23592 Infection of other part of genital tract in pregnancy, second trimester: Secondary | ICD-10-CM | POA: Insufficient documentation

## 2021-05-05 DIAGNOSIS — Z7982 Long term (current) use of aspirin: Secondary | ICD-10-CM | POA: Diagnosis not present

## 2021-05-05 DIAGNOSIS — O26892 Other specified pregnancy related conditions, second trimester: Secondary | ICD-10-CM

## 2021-05-05 DIAGNOSIS — B9689 Other specified bacterial agents as the cause of diseases classified elsewhere: Secondary | ICD-10-CM | POA: Diagnosis not present

## 2021-05-05 DIAGNOSIS — N390 Urinary tract infection, site not specified: Secondary | ICD-10-CM | POA: Insufficient documentation

## 2021-05-05 DIAGNOSIS — O26893 Other specified pregnancy related conditions, third trimester: Secondary | ICD-10-CM | POA: Insufficient documentation

## 2021-05-05 DIAGNOSIS — N760 Acute vaginitis: Secondary | ICD-10-CM

## 2021-05-05 LAB — URINALYSIS, MICROSCOPIC (REFLEX)

## 2021-05-05 LAB — URINALYSIS, ROUTINE W REFLEX MICROSCOPIC
Bilirubin Urine: NEGATIVE
Glucose, UA: NEGATIVE mg/dL
Hgb urine dipstick: NEGATIVE
Ketones, ur: 40 mg/dL — AB
Nitrite: POSITIVE — AB
Protein, ur: NEGATIVE mg/dL
Specific Gravity, Urine: 1.025 (ref 1.005–1.030)
pH: 6 (ref 5.0–8.0)

## 2021-05-05 LAB — WET PREP, GENITAL
Sperm: NONE SEEN
Trich, Wet Prep: NONE SEEN
WBC, Wet Prep HPF POC: >=10 — AB (ref <10)
Yeast Wet Prep HPF POC: NONE SEEN

## 2021-05-05 MED ORDER — METRONIDAZOLE 500 MG PO TABS: 500.0000 mg | ORAL_TABLET | Freq: Two times a day (BID) | ORAL | 0 refills | Status: AC

## 2021-05-05 MED ORDER — CEFADROXIL 500 MG PO CAPS: 500.0000 mg | ORAL_CAPSULE | Freq: Two times a day (BID) | ORAL | 0 refills | Status: DC

## 2021-05-05 MED ORDER — ACETAMINOPHEN 500 MG PO TABS
1000.0000 mg | ORAL_TABLET | Freq: Once | ORAL | Status: AC
  Administered 2021-05-05: 1000 mg via ORAL
  Filled 2021-05-05: qty 2

## 2021-05-05 NOTE — MAU Provider Note (Signed)
History     CSN: [id number]  Arrival date and time: 05/05/21 1550   Event Date/Time   First Provider Initiated Contact with Patient 05/05/21 1751      Chief Complaint  Patient presents with   Abdominal Pain   Hypertension   Headache   HPI  [name] is a 31 y.o. female [phone] @ 63w1dhere in MAU with complaints of lower abdominal pain and HA. She has had HA off and on throughout the pregnancy. This HA is not worse than the HA's she has had in the past. The HA is located in the center of her forehead. The headache is band like. She took one tylenol earlier which helped some.  Tylenol usually helps her HA's.  Reports occasional lower abdominal pain that comes and goes. She has no bleeding. The pain does not radiate.  Hx of 26 week loss.   OB History     Gravida  4   Para  2   Term  0   Preterm  2   AB  1   Living  1      SAB  1   IAB  0   Ectopic  0   Multiple  0   Live Births  2           Past Medical History:  Diagnosis Date   ADHD (attention deficit hyperactivity disorder)    Anemia    Anxiety    Bipolar 1 disorder ([facility]    Chlamydia 05/31/2010   Chronic constipation    Depression    depression   GERD (gastroesophageal reflux disease)    with pregnancy   Gestational diabetes    current pregnancy   Gonorrhea contact, treated    Headache    Hypertension    Infection    UTI   Pregnancy induced hypertension    previous pregnancy   Pseudocyesis 2013   Seen in MAU for percieved FM, abd distension. Normal exam.    Sickle cell trait ([facility]    Type 2 diabetes mellitus ([facility] 02/13/2017    Past Surgical History:  Procedure Laterality Date   CESAREAN SECTION N/A 09/01/2012   Procedure:  Primary cesarean section with delivery of baby girl at 132  Apgars 1/1.  ;  Surgeon: BFrederico Hamman MD;  [facility];  Service: Obstetrics;  Laterality: N/A;   CESAREAN SECTION N/A 01/07/2015   Procedure: REPEAT CESAREAN SECTION;  Surgeon:  CShelly Bombard MD;  [facility];  Service: Obstetrics;  Laterality: N/A;   NASAL SEPTUM SURGERY     WISDOM TOOTH EXTRACTION      Family History  Problem Relation Age of Onset   Kidney disease Mother    Hypertension Father    Cancer Sister    Asthma Brother    Diabetes Maternal Grandfather    Heart disease Neg Hx     Social History   Tobacco Use   Smoking status: Never   Smokeless tobacco: Never  Vaping Use   Vaping Use: Never used  Substance Use Topics   Alcohol use: No    Alcohol/week: 0.0 standard drinks   Drug use: No    Allergies: No Known Allergies  Medications Prior to Admission  Medication Sig Dispense Refill Last Dose   aspirin EC 81 MG tablet Take 81 mg by mouth daily. Swallow whole.   05/05/2021   cholecalciferol (VITAMIN D3) 25 MCG (1000 UNIT) tablet Take 1,000 Units by mouth daily.   05/05/2021  metFORMIN (GLUCOPHAGE-XR) 500 MG 24 hr tablet Take 2 tablets po daily after breakfast and after supper. 120 tablet 11 05/05/2021   NIFEdipine (PROCARDIA) 20 MG capsule Take 20 mg by mouth once.   05/05/2021   Prenat-Fe Poly-Methfol-FA-DHA (VITAFOL ULTRA) 29-0.6-0.4-200 MG CAPS Take 1 capsule by mouth daily before breakfast. 90 capsule 4 05/05/2021   ACCU-CHEK FASTCLIX LANCETS MISC TEST UPTO 4 TIMES DAILY 102 each 3    blood glucose meter kit and supplies KIT Dispense based on patient and insurance preference. Use up to four times daily as directed. (FOR ICD-9 250.00, 250.01). 1 each 0    diphenhydrAMINE (BENADRYL) 25 mg capsule Take 1 capsule (25 mg total) by mouth every 6 (six) hours as needed (headache or itching). 20 capsule 0    glucose blood test strip TEST UPTO 4 TIMES DAILY 100 each 3    metoCLOPramide (REGLAN) 10 MG tablet Take 1 tablet (10 mg total) by mouth every 6 (six) hours as needed (headache or nausea). 20 tablet 0    Results for orders placed or performed during the hospital encounter of 05/05/21 (from the past 72 hour(s))  Urinalysis, Routine w reflex  microscopic Urine, Clean Catch     Status: Abnormal   Collection Time: 05/05/21  5:34 PM  Result Value Ref Range   Color, Urine YELLOW YELLOW   APPearance CLEAR CLEAR   Specific Gravity, Urine 1.025 1.005 - 1.030   pH 6.0 5.0 - 8.0   Glucose, UA NEGATIVE NEGATIVE mg/dL   Hgb urine dipstick NEGATIVE NEGATIVE   Bilirubin Urine NEGATIVE NEGATIVE   Ketones, ur 40 (A) NEGATIVE mg/dL   Protein, ur NEGATIVE NEGATIVE mg/dL   Nitrite POSITIVE (A) NEGATIVE   Leukocytes,Ua TRACE (A) NEGATIVE    Comment: Performed at [facility] [address], [place], [place] [address]  Urinalysis, Microscopic (reflex)     Status: Abnormal   Collection Time: 05/05/21  5:34 PM  Result Value Ref Range   RBC / HPF 0-5 0 - 5 RBC/hpf   WBC, UA 11-20 0 - 5 WBC/hpf   Bacteria, UA MANY (A) NONE SEEN   Squamous Epithelial / LPF 6-10 0 - 5   Mucus PRESENT    Hyaline Casts, UA PRESENT     Comment: Performed at [facility], [place] [address], [place],  [address]  Wet prep, genital     Status: Abnormal   Collection Time: 05/05/21  6:50 PM  Result Value Ref Range   Yeast Wet Prep HPF POC NONE SEEN NONE SEEN   Trich, Wet Prep NONE SEEN NONE SEEN   Clue Cells Wet Prep HPF POC PRESENT (A) NONE SEEN   WBC, Wet Prep HPF POC >=10 (A) <10   Sperm NONE SEEN     Comment: Performed at [facility], [place] [address], [place],  [address]     Review of Systems  Constitutional:  Negative for fever.  Eyes:  Negative for visual disturbance.  Gastrointestinal:  Positive for abdominal pain.  Genitourinary:  Negative for vaginal bleeding.  Neurological:  Positive for headaches.  Physical Exam   Temperature 98.1 F (36.7 C), temperature source Oral, resp. rate 17, last menstrual period 01/21/2021, SpO2 100 %.  Physical Exam Exam conducted with a chaperone present.  Constitutional:      General: She is not in acute distress.    Appearance: She is well-developed. She is not ill-appearing,  toxic-appearing or diaphoretic.  HENT:     Head:  Normocephalic.  Pulmonary:     Effort: Pulmonary effort is normal.  Abdominal:     Tenderness: There is no abdominal tenderness.  Genitourinary:      Comments: ? Folliculitis to right labia majora. HSV culture collected. No concern for infection. Cervix closed, thick, posterior.  Skin:    General: Skin is warm.  Neurological:     Mental Status: She is alert and oriented to person, place, and time.   MAU Course  Procedures  MDM  + fetal heart tones via bedside US Tylenol given 1 gram. Ha now 0/10 Urine culture sent and pending.   Assessment and Plan   A:  1. Pregnancy headache in second trimester   2. Bacterial vaginosis   3. [redacted] weeks gestation of pregnancy   4. Abdominal pain during pregnancy in third trimester   5. Urinary tract infection in mother during second trimester of pregnancy      P:  Discharge home in stable condition Rx: flagyl, duricef  Return to MAU if symptoms worsen May benefit from a neurology consult for hx of headaches. Urine culture pending  Lezlie Lye, NP 05/05/2021 8:44 PM

## 2021-05-05 NOTE — MAU Note (Signed)
.  Stephanie Mack is a 31 y.o. at [redacted]w[redacted]d here in MAU reporting: lower abdominal pain and headache that started this morning. Denies Vb or LOF. Has not taken anything for the pain. States the pain is aching.   Pain score: Head: 7                    Abdomen: 8 Vitals:   05/05/21 1731  Resp: 17  Temp: 98.1 F (36.7 C)  SpO2: 100%     FHT:145 Lab orders placed from triage:  UA

## 2021-05-07 LAB — CULTURE, OB URINE

## 2021-05-08 LAB — HSV DNA BY PCR (REFERENCE LAB)
HSV 1 DNA: NEGATIVE
HSV 2 DNA: POSITIVE — AB

## 2021-05-10 ENCOUNTER — Other Ambulatory Visit: Payer: Self-pay | Admitting: Advanced Practice Midwife

## 2021-05-10 MED ORDER — VALACYCLOVIR HCL 1 G PO TABS: 1000.0000 mg | ORAL_TABLET | Freq: Two times a day (BID) | ORAL | 0 refills | Status: AC

## 2021-05-10 NOTE — Progress Notes (Signed)
Called patient and verified I was speaking to the correct patient with 2 identifiers.   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   Discussed these results and answered questions.   RX: Valtrex 1g BID x 10 days   Marcille Buffy DNP, CNM  05/10/21  2:07 PM

## 2021-05-15 ENCOUNTER — Inpatient Hospital Stay (HOSPITAL_COMMUNITY)
Admission: AD | Admit: 2021-05-15 | Discharge: 2021-05-16 | Disposition: A | Discharge status: Home / Self Care | Payer: Medicaid Other | Attending: Obstetrics and Gynecology | Admitting: Obstetrics and Gynecology

## 2021-05-15 DIAGNOSIS — M549 Dorsalgia, unspecified: Secondary | ICD-10-CM | POA: Insufficient documentation

## 2021-05-15 DIAGNOSIS — Z3A17 17 weeks gestation of pregnancy: Secondary | ICD-10-CM | POA: Insufficient documentation

## 2021-05-15 DIAGNOSIS — M7918 Myalgia, other site: Secondary | ICD-10-CM

## 2021-05-15 DIAGNOSIS — O26892 Other specified pregnancy related conditions, second trimester: Secondary | ICD-10-CM | POA: Insufficient documentation

## 2021-05-16 ENCOUNTER — Encounter (HOSPITAL_COMMUNITY): Payer: Self-pay | Admitting: Obstetrics and Gynecology

## 2021-05-16 DIAGNOSIS — Z3A17 17 weeks gestation of pregnancy: Secondary | ICD-10-CM | POA: Diagnosis not present

## 2021-05-16 DIAGNOSIS — M7918 Myalgia, other site: Secondary | ICD-10-CM

## 2021-05-16 DIAGNOSIS — O26892 Other specified pregnancy related conditions, second trimester: Secondary | ICD-10-CM | POA: Diagnosis not present

## 2021-05-16 DIAGNOSIS — M549 Dorsalgia, unspecified: Secondary | ICD-10-CM | POA: Diagnosis present

## 2021-05-16 MED ORDER — CYCLOBENZAPRINE HCL 10 MG PO TABS: 10.0000 mg | ORAL_TABLET | Freq: Two times a day (BID) | ORAL | 0 refills | Status: DC | PRN

## 2021-05-16 NOTE — Discharge Instructions (Signed)
Soak in a tub of warm water with EPSOM SALT for 20-30 minutes. You can safely take Tylenol 1000 mg every 8 hours as needed for pain.  Safe Medications in Pregnancy   Acne: Benzoyl Peroxide Salicylic Acid  Backache/Headache: Tylenol: 2 regular strength every 4 hours OR              2 Extra strength every 6 hours  Colds/Coughs/Allergies: Benadryl (alcohol free) 25 mg every 6 hours as needed Breath right strips Claritin Cepacol throat lozenges Chloraseptic throat spray Cold-Eeze- up to three times per day Cough drops, alcohol free Flonase (by prescription only) Guaifenesin Mucinex Robitussin DM (plain only, alcohol free) Saline nasal spray/drops Sudafed (pseudoephedrine) & Actifed ** use only after [redacted] weeks gestation and if you do not have high blood pressure Tylenol Vicks Vaporub Zinc lozenges Zyrtec   Constipation: Colace Ducolax suppositories Fleet enema Glycerin suppositories Metamucil Milk of magnesia Miralax Senokot Smooth move tea  Diarrhea: Kaopectate Imodium A-D  *NO pepto Bismol  Hemorrhoids: Anusol Anusol HC Preparation H Tucks  Indigestion: Tums Maalox Mylanta Zantac  Pepcid  Insomnia: Benadryl (alcohol free) 25mg  every 6 hours as needed Tylenol PM Unisom, no Gelcaps  Leg Cramps: Tums MagGel  Nausea/Vomiting:  Bonine Dramamine Emetrol Ginger extract Sea bands Meclizine  Nausea medication to take during pregnancy:  Unisom (doxylamine succinate 25 mg tablets) Take one tablet daily at bedtime. If symptoms are not adequately controlled, the dose can be increased to a maximum recommended dose of two tablets daily (1/2 tablet in the morning, 1/2 tablet mid-afternoon and one at bedtime). Vitamin B6 100mg  tablets. Take one tablet twice a day (up to 200 mg per day).  Skin Rashes: Aveeno products Benadryl cream or 25mg  every 6 hours as needed Calamine Lotion 1% cortisone cream  Yeast infection: Gyne-lotrimin 7 Monistat  7   **If taking multiple medications, please check labels to avoid duplicating the same active ingredients **take medication as directed on the label ** Do not exceed 4000 mg of tylenol in 24 hours **Do not take medications that contain aspirin or ibuprofen
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
## 2021-05-16 NOTE — MAU Provider Note (Signed)
Event Date/Time   First Provider Initiated Contact with Patient 05/16/21 0024      S [name] is a 31 y.o. [phone] patient who presents to MAU today with complaint of mild RT buttock pain after falling of a chair last night, woke up with a sore throat and runny nose. She took Tylenol 500 mg today and had "some relief" of the pain. She also complains that she often gets H/As off & on since becoming pregnant, but none today. She is a patient of [facility]; next appt 05/28/2021.  O BP 126/79    Pulse 95    Temp 98.1 F (36.7 C)    Resp 18    LMP 01/21/2021  Physical Exam Vitals and nursing note reviewed.  Constitutional:      Appearance: Normal appearance. She is obese.  Cardiovascular:     Rate and Rhythm: Normal rate.  Pulmonary:     Effort: Pulmonary effort is normal.  Abdominal:     Palpations: Abdomen is soft.  Genitourinary:    Comments: Not indicated Musculoskeletal:        General: Normal range of motion.  Skin:    General: Skin is warm and dry.  Neurological:     Mental Status: She is alert.  Psychiatric:        Mood and Affect: Mood normal.        Behavior: Behavior normal.        Thought Content: Thought content normal.        Judgment: Judgment normal.   FHTs by doppler: 152 bpm   A Medical screening exam complete Musculoskeletal pain - Plan: Discharge patient  [redacted] weeks gestation of pregnancy   P Discharge from MAU in stable condition List of safe medications in pregnancy given Warning signs for worsening condition that would warrant emergency follow-up discussed Patient may return to MAU as needed for pregnancy related issues  Laury Deep, CNM 05/16/2021 12:25 AM

## 2021-05-17 MED ORDER — VALACYCLOVIR HCL 1 G PO TABS: 2000.0000 mg | ORAL_TABLET | Freq: Two times a day (BID) | ORAL | 0 refills | Status: AC

## 2021-05-21 ENCOUNTER — Inpatient Hospital Stay (HOSPITAL_COMMUNITY)
Admission: AD | Admit: 2021-05-21 | Discharge: 2021-05-21 | Disposition: A | Discharge status: Home / Self Care | Payer: Medicaid Other | Attending: Obstetrics and Gynecology | Admitting: Obstetrics and Gynecology

## 2021-05-21 ENCOUNTER — Other Ambulatory Visit: Payer: Self-pay

## 2021-05-21 DIAGNOSIS — O26892 Other specified pregnancy related conditions, second trimester: Secondary | ICD-10-CM | POA: Diagnosis not present

## 2021-05-21 DIAGNOSIS — R55 Syncope and collapse: Secondary | ICD-10-CM | POA: Diagnosis not present

## 2021-05-21 DIAGNOSIS — R1030 Lower abdominal pain, unspecified: Secondary | ICD-10-CM | POA: Diagnosis not present

## 2021-05-21 DIAGNOSIS — Z3A18 18 weeks gestation of pregnancy: Secondary | ICD-10-CM | POA: Insufficient documentation

## 2021-05-21 DIAGNOSIS — Z3492 Encounter for supervision of normal pregnancy, unspecified, second trimester: Secondary | ICD-10-CM

## 2021-05-21 LAB — CBC
HCT: 34 % — ABNORMAL LOW (ref 36.0–46.0)
Hemoglobin: 10.9 g/dL — ABNORMAL LOW (ref 12.0–15.0)
MCH: 26 pg (ref 26.0–34.0)
MCHC: 32.1 g/dL (ref 30.0–36.0)
MCV: 81.1 fL (ref 80.0–100.0)
Platelets: 256 10*3/uL (ref 150–400)
RBC: 4.19 MIL/uL (ref 3.87–5.11)
RDW: 14.3 % (ref 11.5–15.5)
WBC: 9.6 10*3/uL (ref 4.0–10.5)
nRBC: 0 % (ref 0.0–0.2)

## 2021-05-21 LAB — BASIC METABOLIC PANEL
Anion gap: 9 (ref 5–15)
BUN: 5 mg/dL — ABNORMAL LOW (ref 6–20)
CO2: 21 mmol/L — ABNORMAL LOW (ref 22–32)
Calcium: 9.3 mg/dL (ref 8.9–10.3)
Chloride: 105 mmol/L (ref 98–111)
Creatinine, Ser: 0.54 mg/dL (ref 0.44–1.00)
GFR, Estimated: >60 mL/min (ref >60)
Glucose, Bld: 107 mg/dL — ABNORMAL HIGH (ref 70–99)
Potassium: 3.3 mmol/L — ABNORMAL LOW (ref 3.5–5.1)
Sodium: 135 mmol/L (ref 135–145)

## 2021-05-21 LAB — URINALYSIS, ROUTINE W REFLEX MICROSCOPIC
Bilirubin Urine: NEGATIVE
Glucose, UA: 50 mg/dL — AB
Hgb urine dipstick: NEGATIVE
Ketones, ur: NEGATIVE mg/dL
Leukocytes,Ua: NEGATIVE
Nitrite: NEGATIVE
Protein, ur: NEGATIVE mg/dL
Specific Gravity, Urine: 1.02 (ref 1.005–1.030)
pH: 6 (ref 5.0–8.0)
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
## 2021-05-31 ENCOUNTER — Other Ambulatory Visit: Payer: Self-pay | Admitting: Obstetrics and Gynecology

## 2021-05-31 DIAGNOSIS — Z3689 Encounter for other specified antenatal screening: Secondary | ICD-10-CM

## 2021-06-02 ENCOUNTER — Other Ambulatory Visit: Payer: Self-pay

## 2021-06-02 ENCOUNTER — Inpatient Hospital Stay (HOSPITAL_COMMUNITY)
Admission: AD | Admit: 2021-06-02 | Discharge: 2021-06-03 | Disposition: A | Discharge status: Home / Self Care | Payer: Medicaid Other | Attending: Obstetrics and Gynecology | Admitting: Obstetrics and Gynecology

## 2021-06-02 ENCOUNTER — Encounter (HOSPITAL_COMMUNITY): Payer: Self-pay | Admitting: Obstetrics and Gynecology

## 2021-06-02 DIAGNOSIS — O26892 Other specified pregnancy related conditions, second trimester: Secondary | ICD-10-CM | POA: Insufficient documentation

## 2021-06-02 DIAGNOSIS — O26899 Other specified pregnancy related conditions, unspecified trimester: Secondary | ICD-10-CM

## 2021-06-02 DIAGNOSIS — Z7982 Long term (current) use of aspirin: Secondary | ICD-10-CM | POA: Insufficient documentation

## 2021-06-02 DIAGNOSIS — Z3A20 20 weeks gestation of pregnancy: Secondary | ICD-10-CM

## 2021-06-02 DIAGNOSIS — R102 Pelvic and perineal pain: Secondary | ICD-10-CM | POA: Insufficient documentation

## 2021-06-02 NOTE — MAU Provider Note (Signed)
?History  ?  ? ?[id number] ? ?Arrival date and time: 06/02/21 2249 ?  ? ?Chief Complaint  ?Patient presents with  ? Abdominal Pain  ? ? ? ?HPI ?Stephanie Mack is a 31 y.o. at 36w1dwith PMHx notable for preeclampsia, who presents for abdominal pain. ?Reports symptoms started at 9 PM.  Describes lower abdominal pain that occurs when she goes to lie down or goes to sit up.  While lying here has no pain.  Denies nausea, vomiting, diarrhea, constipation, dysuria, loss of fluid, or vaginal bleeding.  Denies complications with this pregnancy. ? ?OB History   ? ? Gravida  ?4  ? Para  ?2  ? Term  ?0  ? Preterm  ?2  ? AB  ?1  ? Living  ?1  ?  ? ? SAB  ?1  ? IAB  ?0  ? Ectopic  ?0  ? Multiple  ?0  ? Live Births  ?2  ?   ?  ?  ? ? ?Past Medical History:  ?Diagnosis Date  ? ADHD (attention deficit hyperactivity disorder)   ? Anemia   ? Anxiety   ? [place] 1 disorder [place]   ? Chlamydia 05/31/2010  ? Chronic constipation   ? Depression   ? depression  ? GERD (gastroesophageal reflux disease)   ? with pregnancy  ? Gestational diabetes   ? current pregnancy  ? Gonorrhea contact, treated   ? Headache   ? Hypertension   ? Infection   ? UTI  ? Pregnancy induced hypertension   ? previous pregnancy  ? Pseudocyesis 2013  ? Seen in MAU for percieved FM, abd distension. Normal exam.   ? [place]   ? [place] 02/13/2017  ? ? ?Past Surgical History:  ?Procedure Laterality Date  ? CESAREAN SECTION N/A 09/01/2012  ? Procedure:  Primary cesarean section with delivery of baby girl at 146  Apgars 1/1.  ;  Surgeon: BFrederico Hamman MD;  [facility];  Service: Obstetrics;  Laterality: N/A;  ? CESAREAN SECTION N/A 01/07/2015  ? Procedure: REPEAT CESAREAN SECTION;  Surgeon: CShelly Bombard MD;  [facility];  Service: Obstetrics;  Laterality: N/A;  ? NASAL SEPTUM SURGERY    ? WISDOM TOOTH EXTRACTION    ? ? ?Family History  ?Problem Relation Age of Onset  ? Kidney disease Mother   ? Hypertension Father    ? Cancer Sister   ? Asthma Brother   ? Diabetes Maternal Grandfather   ? Heart disease Neg Hx   ? ? ?No Known Allergies ? ?No current facility-administered medications on file prior to encounter.  ? ?Current Outpatient Medications on File Prior to Encounter  ?Medication Sig Dispense Refill  ? ACCU-CHEK FASTCLIX LANCETS MISC TEST UPTO 4 TIMES DAILY 102 each 3  ? aspirin EC 81 MG tablet Take 81 mg by mouth daily. Swallow whole.    ? blood glucose meter kit and supplies KIT Dispense based on patient and insurance preference. Use up to four times daily as directed. (FOR ICD-9 250.00, 250.01). 1 each 0  ? glucose blood test strip TEST UPTO 4 TIMES DAILY 100 each 3  ? metFORMIN (GLUCOPHAGE-XR) 500 MG 24 hr tablet Take 2 tablets po daily after breakfast and after supper. 120 tablet 11  ? cholecalciferol (VITAMIN D3) 25 MCG (1000 UNIT) tablet Take 1,000 Units by mouth daily.    ? NIFEdipine (PROCARDIA) 20 MG capsule Take 20 mg by mouth once.    ?  Prenat-Fe Poly-Methfol-FA-DHA (VITAFOL ULTRA) 29-0.6-0.4-200 MG CAPS Take 1 capsule by mouth daily before breakfast. 90 capsule 4  ? ? ? ?Review of Systems  ?Constitutional: Negative.   ?Gastrointestinal:  Positive for abdominal pain.  ?Genitourinary: Negative.   ?Pertinent positives and negative per HPI, all others reviewed and negative ? ?Physical Exam  ? ?BP 122/74 (BP Location: Right Arm)   Pulse 68   Temp 98.2 ?F (36.8 ?C) (Oral)   Resp 16   Ht _0  (1.575 m)   Wt 70.7 kg   LMP 01/21/2021   SpO2 100%   BMI 28.50 kg/m?  ? ?Patient Vitals for the past 24 hrs: ? BP Temp Temp src Pulse Resp SpO2 Height Weight  ?06/03/21 0024 122/74 -- -- 68 16 -- -- --  ?06/02/21 2328 128/81 98.2 ?F (36.8 ?C) Oral 78 17 100 % _1  (1.575 m) 70.7 kg  ? ? ?Physical Exam ?Vitals and nursing note reviewed. Exam conducted with a chaperone present.  ?Constitutional:   ?   General: She is not in acute distress. ?   Appearance: She is well-developed.  ?HENT:  ?   Head: Normocephalic and  atraumatic.  ?Eyes:  ?   General: No scleral icterus. ?Pulmonary:  ?   Effort: Pulmonary effort is normal. No respiratory distress.  ?Abdominal:  ?   Palpations: Abdomen is soft.  ?   Tenderness: There is no abdominal tenderness.  ?Skin: ?   General: Skin is warm and dry.  ?Neurological:  ?   Mental Status: She is alert.  ?Psychiatric:     ?   Mood and Affect: Mood normal.     ?   Behavior: Behavior normal.  ?  ? ?Cervical Exam ?Dilation: Closed ?Effacement (%): Thick ?Cervical Position: Posterior ?Exam by:: Jorje Guild NP ? ? ? ?Labs ?Results for orders placed or performed during the hospital encounter of 06/02/21 (from the past 24 hour(s))  ?Urinalysis, Routine w reflex microscopic Urine, Clean Catch     Status: Abnormal  ? Collection Time: 06/02/21 11:48 PM  ?Result Value Ref Range  ? Color, Urine YELLOW YELLOW  ? APPearance HAZY (A) CLEAR  ? Specific Gravity, Urine 1.015 1.005 - 1.030  ? pH 6.0 5.0 - 8.0  ? Glucose, UA NEGATIVE NEGATIVE mg/dL  ? Hgb urine dipstick NEGATIVE NEGATIVE  ? Bilirubin Urine NEGATIVE NEGATIVE  ? Ketones, ur NEGATIVE NEGATIVE mg/dL  ? Protein, ur NEGATIVE NEGATIVE mg/dL  ? Nitrite NEGATIVE NEGATIVE  ? Leukocytes,Ua NEGATIVE NEGATIVE  ? ? ?Imaging ?No results found. ? ?MAU Course  ?Procedures ?Lab Orders    ?     Urinalysis, Routine w reflex microscopic Urine, Clean Catch    ?No orders of the defined types were placed in this encounter. ? ?Imaging Orders  ?No imaging studies ordered today  ? ? ?MDM ?FHT present via doppler ?Ordered & reviewed urinalysis ? ?Abdominal pain only occurs with movement, consistent with round ligament pain.  Cervix is closed thick and firm.  UA is negative.  Discussed treating at home with maternity support belt and Tylenol as needed. ?Assessment and Plan  ? ?1. Pain of round ligament affecting pregnancy, antepartum   ?2. [redacted] weeks gestation of pregnancy   ? ?-Encouraged patient to get maternity support belt. ?- Reviewed reasons to return to MAU ?- Take Tylenol  as needed ?- Follow-up with OB as scheduled ? ?Jorje Guild, NP ?06/03/21 ?1:38 AM ? ? ?

## 2021-06-02 NOTE — MAU Note (Signed)
Stephanie Mack is a 31 y.o. at [redacted]w[redacted]d here in MAU reporting: "I've been in pain since 9 o'clock" states she has lower abdominal pain that comes and goes.Marland Kitchen describes it as a shooting pain. Denies VB or LOF. Has started to feel some movement.  ? ?Onset of complaint: 2100 ?Pain score: 8 ?  ?FHT:134 ?Lab orders placed from triage: U/A  ? ?

## 2021-06-03 ENCOUNTER — Other Ambulatory Visit: Payer: Self-pay

## 2021-06-03 DIAGNOSIS — Z7982 Long term (current) use of aspirin: Secondary | ICD-10-CM | POA: Diagnosis not present

## 2021-06-03 DIAGNOSIS — R102 Pelvic and perineal pain: Secondary | ICD-10-CM | POA: Diagnosis not present

## 2021-06-03 DIAGNOSIS — O26899 Other specified pregnancy related conditions, unspecified trimester: Secondary | ICD-10-CM | POA: Diagnosis not present

## 2021-06-03 DIAGNOSIS — O26892 Other specified pregnancy related conditions, second trimester: Secondary | ICD-10-CM | POA: Diagnosis not present

## 2021-06-03 DIAGNOSIS — Z3A20 20 weeks gestation of pregnancy: Secondary | ICD-10-CM | POA: Diagnosis not present

## 2021-06-03 DIAGNOSIS — R109 Unspecified abdominal pain: Secondary | ICD-10-CM | POA: Diagnosis present

## 2021-06-03 LAB — URINALYSIS, ROUTINE W REFLEX MICROSCOPIC
Bilirubin Urine: NEGATIVE
Glucose, UA: NEGATIVE mg/dL
Hgb urine dipstick: NEGATIVE
Ketones, ur: NEGATIVE mg/dL
Leukocytes,Ua: NEGATIVE
Nitrite: NEGATIVE
Protein, ur: NEGATIVE mg/dL
Specific Gravity, Urine: 1.015 (ref 1.005–1.030)
pH: 6 (ref 5.0–8.0)

## 2021-06-16 ENCOUNTER — Ambulatory Visit (HOSPITAL_BASED_OUTPATIENT_CLINIC_OR_DEPARTMENT_OTHER): Payer: Medicaid Other | Admitting: Obstetrics

## 2021-06-16 ENCOUNTER — Ambulatory Visit: Payer: Medicaid Other | Admitting: *Deleted

## 2021-06-16 ENCOUNTER — Other Ambulatory Visit: Payer: Self-pay

## 2021-06-16 ENCOUNTER — Ambulatory Visit: Payer: Medicaid Other | Attending: Obstetrics and Gynecology

## 2021-06-16 ENCOUNTER — Other Ambulatory Visit: Payer: Self-pay | Admitting: *Deleted

## 2021-06-16 VITALS — BP 123/73 | HR 89

## 2021-06-16 DIAGNOSIS — O10912 Unspecified pre-existing hypertension complicating pregnancy, second trimester: Secondary | ICD-10-CM

## 2021-06-16 DIAGNOSIS — Z3689 Encounter for other specified antenatal screening: Secondary | ICD-10-CM | POA: Insufficient documentation

## 2021-06-16 DIAGNOSIS — Z3A22 22 weeks gestation of pregnancy: Secondary | ICD-10-CM

## 2021-06-16 DIAGNOSIS — O24112 Pre-existing diabetes mellitus, type 2, in pregnancy, second trimester: Secondary | ICD-10-CM | POA: Diagnosis present

## 2021-06-16 DIAGNOSIS — O24319 Unspecified pre-existing diabetes mellitus in pregnancy, unspecified trimester: Secondary | ICD-10-CM

## 2021-06-16 NOTE — Progress Notes (Signed)
MFM Note ? ?Stephanie Mack was seen for a detailed fetal anatomy scan and consultation due to pregestational diabetes treated with metformin and chronic hypertension treated with Procardia.  The patient reports that she was diagnosed with diabetes about 6 years ago.  Her most recent hemoglobin A1c was 5.8%. ? ?During her pregnancy in 2014, she developed severe preeclampsia requiring an indicated preterm delivery via classical C-section.  Unfortunately, that baby did not survive due to extreme prematurity.  Her next pregnancy in 2016, resulted in a 36-week repeat cesarean delivery without any major issues. ? ?She denies any other significant past medical history and denies any problems in her current pregnancy.  ?  ?She had a cell free DNA test earlier in her pregnancy which indicated a low risk for trisomy 26, 27, and 13. A female fetus is predicted.  Her MSAFP was 0.76 MoM. ? ?She was informed that the fetal growth and amniotic fluid level were appropriate for her gestational age.  ? ?There were no obvious fetal anomalies noted on today's ultrasound exam.  However, today's exam was limited due to the fetal position. ? ?The patient was informed that anomalies may be missed due to technical limitations. If the fetus is in a suboptimal position or maternal habitus is increased, visualization of the fetus in the maternal uterus may be impaired. ? ?The following were discussed during our consultation today: ? ?Pregestational diabetes and pregnancy ? ?The implications and management of diabetes in pregnancy was discussed in detail with the patient.   ? ?She was advised to continue to monitor her fingersticks 4 times daily (fasting and 2 hours after each meal).   ? ?She was advised that our goals for her fingerstick values are fasting values of 90-95 or less and two-hour postprandial values of 120 or less.   ? ?Should the majority of her fingerstick results be above these values, her metformin dose may have to be increased  or she may have to be switched to insulin to help her achieve better glycemic control.  ? ?The patient was advised that getting her fingerstick values as close to these goals as possible would provide her with the most optimal obstetrical outcome. ? ?Due to pregestational diabetes, she was referred to [facility] pediatric cardiology for a fetal echocardiogram.  We will continue to follow her with monthly growth ultrasounds.   ? ?Weekly fetal testing using NSTs or biophysical profiles to be started at around 32 weeks. ? ?The increased risk of polyhydramnios, fetal macrosomia, and preeclampsia associated with diabetes was also discussed.   ? ?Chronic hypertension in pregnancy ? ?The implications and management of chronic hypertension in pregnancy was discussed.  She was advised to continue taking Procardia for blood pressure control throughout her pregnancy.  The dosage of her Procardia may need to be increased should her blood pressures be persistently greater than the 140 over 90s range.   ? ?The increased risk of superimposed preeclampsia, an indicated preterm delivery, and possible fetal growth restriction due to chronic hypertension in pregnancy was discussed.  ?  ?Due to her significant history of early onset severe preeclampsia in addition to her underlying medical conditions, she was advised to take 2 tablets of baby aspirin (81 mg each) daily for the duration of her pregnancy for preeclampsia prophylaxis.   ? ?Prior classical cesarean delivery ? ?Due to her prior classical C-section, a repeat C-section should be scheduled at between 41 to 27 weeks. ? ?The patient was reassured that many pregnant women have pregnancies  complicated by pregestational diabetes and  chronic hypertension.  With proper management, most women with these conditions may anticipate a successful pregnancy outcome.   ? ?The patient stated that all of her questions have been answered to her satisfaction.   ? ?We will continue to follow her  closely with you throughout her pregnancy. ? ?A follow-up exam was scheduled in 4 weeks. ? ?A total of 45 minutes was spent counseling and coordinating the care for this patient.  Greater than 50% of the time was spent in direct face-to-face contact.  ?

## 2021-06-22 ENCOUNTER — Inpatient Hospital Stay (HOSPITAL_COMMUNITY)
Admission: AD | Admit: 2021-06-22 | Discharge: 2021-06-22 | Disposition: A | Discharge status: Home / Self Care | Payer: Medicaid Other | Attending: Obstetrics & Gynecology | Admitting: Obstetrics & Gynecology

## 2021-06-22 ENCOUNTER — Encounter (HOSPITAL_COMMUNITY): Payer: Self-pay | Admitting: Obstetrics & Gynecology

## 2021-06-22 ENCOUNTER — Other Ambulatory Visit: Payer: Self-pay

## 2021-06-22 DIAGNOSIS — O26892 Other specified pregnancy related conditions, second trimester: Secondary | ICD-10-CM | POA: Insufficient documentation

## 2021-06-22 DIAGNOSIS — O99891 Other specified diseases and conditions complicating pregnancy: Secondary | ICD-10-CM | POA: Insufficient documentation

## 2021-06-22 DIAGNOSIS — M62838 Other muscle spasm: Secondary | ICD-10-CM | POA: Insufficient documentation

## 2021-06-22 DIAGNOSIS — Z3A23 23 weeks gestation of pregnancy: Secondary | ICD-10-CM | POA: Insufficient documentation

## 2021-06-22 DIAGNOSIS — R109 Unspecified abdominal pain: Secondary | ICD-10-CM | POA: Diagnosis not present

## 2021-06-22 DIAGNOSIS — Z3492 Encounter for supervision of normal pregnancy, unspecified, second trimester: Secondary | ICD-10-CM

## 2021-06-22 LAB — URINALYSIS, ROUTINE W REFLEX MICROSCOPIC
Bilirubin Urine: NEGATIVE
Glucose, UA: NEGATIVE mg/dL
Hgb urine dipstick: NEGATIVE
Ketones, ur: NEGATIVE mg/dL
Leukocytes,Ua: NEGATIVE
Nitrite: NEGATIVE
Protein, ur: NEGATIVE mg/dL
Specific Gravity, Urine: 1.019 (ref 1.005–1.030)
pH: 6 (ref 5.0–8.0)

## 2021-06-22 MED ORDER — CYCLOBENZAPRINE HCL 10 MG PO TABS: 10.0000 mg | ORAL_TABLET | Freq: Two times a day (BID) | ORAL | 0 refills | Status: DC | PRN

## 2021-06-22 MED ORDER — CYCLOBENZAPRINE HCL 5 MG PO TABS
10.0000 mg | ORAL_TABLET | Freq: Once | ORAL | Status: AC
Start: 2021-06-22 — End: 2021-06-22
  Administered 2021-06-22: 10 mg via ORAL
  Filled 2021-06-22: qty 2

## 2021-06-22 NOTE — MAU Provider Note (Signed)
?History  ?  ? ?CSN: [id number] ? ?Arrival date and time: 06/22/21 1647 ? ? Event Date/Time  ? First Provider Initiated Contact with Patient 06/22/21 1745   ?  ? ?Chief Complaint  ?Patient presents with  ? Abdominal Pain  ? ?HPI ?Stephanie Mack is a 31 y.o. [phone] at 70w0dwho presents to MAU with right mid-abdominal pain. This is a recurrent problem. Patient states her pain switches sides at night, forcing her to lose sleep and reposition multiple times and she "can't get comfortable". Pain almost completely resolves with rest. She denies abdominal tenderness, vaginal bleeding, dysuria, flank pain, DFM, fever or recent illness. She has not taken medication or tried other treatments for this complaint.  ? ?Patient receives care with [facility]. ? ?OB History   ? ? Gravida  ?4  ? Para  ?2  ? Term  ?0  ? Preterm  ?2  ? AB  ?1  ? Living  ?1  ?  ? ? SAB  ?1  ? IAB  ?0  ? Ectopic  ?0  ? Multiple  ?0  ? Live Births  ?2  ?   ?  ?  ? ? ?Past Medical History:  ?Diagnosis Date  ? ADHD (attention deficit hyperactivity disorder)   ? Anemia   ? Anxiety   ? [place] 1 disorder ([place]   ? Chlamydia 05/31/2010  ? Chronic constipation   ? Depression   ? depression  ? GERD (gastroesophageal reflux disease)   ? with pregnancy  ? Gestational diabetes   ? current pregnancy  ? Gonorrhea contact, treated   ? Headache   ? Hypertension   ? Infection   ? UTI  ? Pregnancy induced hypertension   ? previous pregnancy  ? Pseudocyesis 2013  ? Seen in MAU for percieved FM, abd distension. Normal exam.   ? [place] cell [place] ([place]   ? [place] 02/13[place]  ? ? ?Past Surgical History:  ?Procedure Laterality Date  ? CESAREAN SECTION N/A 09/01/2012  ? Procedure:  Primary cesarean section with delivery of baby girl at 165  Apgars 1/1.  ;  Surgeon: BFrederico Hamman MD;  [facility];  Service: Obstetrics;  Laterality: N/A;  ? CESAREAN SECTION N/A 01/07/2015  ? Procedure: REPEAT CESAREAN SECTION;  Surgeon: CShelly Bombard MD;   [facility];  Service: Obstetrics;  Laterality: N/A;  ? NASAL SEPTUM SURGERY    ? WISDOM TOOTH EXTRACTION    ? ? ?Family History  ?Problem Relation Age of Onset  ? Kidney disease Mother   ? Hypertension Father   ? Cancer Sister   ? Asthma Brother   ? Diabetes Maternal Grandfather   ? Heart disease Neg Hx   ? ? ?Social History  ? ?Tobacco Use  ? Smoking status: Never  ? Smokeless tobacco: Never  ?Vaping Use  ? Vaping Use: Never used  ?Substance Use Topics  ? Alcohol use: No  ?  Alcohol/week: 0.0 standard drinks  ? Drug use: No  ? ? ?Allergies: No Known Allergies ? ?Medications Prior to Admission  ?Medication Sig Dispense Refill Last Dose  ? ACCU-CHEK FASTCLIX LANCETS MISC TEST UPTO 4 TIMES DAILY 102 each 3 06/22/2021  ? aspirin EC 81 MG tablet Take 81 mg by mouth daily. Swallow whole.   06/22/2021  ? blood glucose meter kit and supplies KIT Dispense based on patient and insurance preference. Use up to four times daily as directed. (FOR ICD-9 250.00, 250.01). 1  each 0 06/22/2021  ? cholecalciferol (VITAMIN D3) 25 MCG (1000 UNIT) tablet Take 1,000 Units by mouth daily.   06/22/2021  ? glucose blood test strip TEST UPTO 4 TIMES DAILY 100 each 3 06/22/2021  ? metFORMIN (GLUCOPHAGE-XR) 500 MG 24 hr tablet Take 2 tablets po daily after breakfast and after supper. 120 tablet 11 06/22/2021  ? NIFEdipine (PROCARDIA) 20 MG capsule Take 20 mg by mouth once.   06/22/2021  ? Prenat-Fe Poly-Methfol-FA-DHA (VITAFOL ULTRA) 29-0.6-0.4-200 MG CAPS Take 1 capsule by mouth daily before breakfast. 90 capsule 4 06/22/2021  ? ? ?Review of Systems  ?Gastrointestinal:  Positive for abdominal pain.  ?All other systems reviewed and are negative. ?Physical Exam  ? ?Blood pressure 122/67, pulse (!) 103, temperature (!) 97.4 ?F (36.3 ?C), temperature source Oral, resp. rate 18, height _0  (1.575 m), weight 71.1 kg, last menstrual period 01/21/2021, SpO2 97 %. ? ?Physical Exam ?Vitals and nursing note reviewed. Exam conducted with a chaperone  present.  ?Constitutional:   ?   General: She is not in acute distress. ?   Appearance: She is well-developed. She is not ill-appearing.  ?Cardiovascular:  ?   Rate and Rhythm: Normal rate and regular rhythm.  ?   Heart sounds: Normal heart sounds.  ?Pulmonary:  ?   Effort: Pulmonary effort is normal.  ?   Breath sounds: Normal breath sounds.  ?Abdominal:  ?   Tenderness: There is no abdominal tenderness. There is no right CVA tenderness, left CVA tenderness, guarding or rebound.  ?   Comments: Gravid  ?Skin: ?   General: Skin is warm and dry.  ?   Capillary Refill: Capillary refill takes less than 2 seconds.  ?Neurological:  ?   Mental Status: She is alert and oriented to person, place, and time.  ? ? ?MAU Course  ?Procedures ? ?MDM ? ?--Cervix closed per digital exam ?--Reassuring fetal surveillance at 23 weeks. Baseline 135, mod var, +10 x 10 accels, no decels ?--Toco: quiet ?--Pertinent negatives: vaginal bleeding, abdominal tenderness, RUQ or RLQ pain, pain that persists at rest, fever ?--Patient sleeping when CNM entered room for pain score re-check. Once awake patient reported pain score 6/10 ? ?Patient Vitals for the past 24 hrs: ? BP Temp Temp src Pulse Resp SpO2 Height Weight  ?06/22/21 1848 139/74 -- -- 89 -- -- -- --  ?06/22/21 1724 122/67 (!) 97.4 ?F (36.3 ?C) Oral (!) 103 18 97 % -- --  ?06/22/21 1717 -- -- -- -- -- -- _1  (1.575 m) 71.1 kg  ? ?Results for orders placed or performed during the hospital encounter of 06/22/21 (from the past 24 hour(s))  ?Urinalysis, Routine w reflex microscopic Urine, Clean Catch     Status: Abnormal  ? Collection Time: 06/22/21  6:04 PM  ?Result Value Ref Range  ? Color, Urine YELLOW YELLOW  ? APPearance HAZY (A) CLEAR  ? Specific Gravity, Urine 1.019 1.005 - 1.030  ? pH 6.0 5.0 - 8.0  ? Glucose, UA NEGATIVE NEGATIVE mg/dL  ? Hgb urine dipstick NEGATIVE NEGATIVE  ? Bilirubin Urine NEGATIVE NEGATIVE  ? Ketones, ur NEGATIVE NEGATIVE mg/dL  ? Protein, ur NEGATIVE  NEGATIVE mg/dL  ? Nitrite NEGATIVE NEGATIVE  ? Leukocytes,Ua NEGATIVE NEGATIVE  ? ?Meds ordered this encounter  ?Medications  ? cyclobenzaprine (FLEXERIL) tablet 10 mg  ? cyclobenzaprine (FLEXERIL) 10 MG tablet  ?  Sig: Take 1 tablet (10 mg total) by mouth 2 (two) times daily as needed for muscle spasms.  ?  Dispense:  20 tablet  ?  Refill:  0  ?  Order Specific Question:   Supervising Provider  ?  Answer:   Woodroe Mode [[id number]]  ? ? ?Assessment and Plan  ?--31 y.o. [id number] at [redacted]w[redacted]d ?--Reactive tracing, closed cervix ?--Musculoskeletal pain in pregnancy ?--Continue Flexeril and maternity belt PRN ?--Discharge home in stable condition ? ?SDarlina Rumpf CNM ? ?

## 2021-06-22 NOTE — Discharge Instructions (Signed)

## 2021-06-22 NOTE — MAU Note (Signed)
.  Katlynn Naser is a 31 y.o. at 56w0dhere in MAU reporting: aching pain located on right lateral abdomen that began around 1600 today.  Denies taking meds or utilizing heat to alleviate pain. ? ?Onset of complaint: today @ 1600 ?Pain score: 8 ?Vitals:  ? 06/22/21 1724  ?BP: 122/67  ?Pulse: (!) 103  ?Resp: 18  ?Temp: (!) 97.4 ?F (36.3 ?C)  ?SpO2: 97%  ?   ?FHT: 145 bpm w/ +FM.  Denies LOF or VB ?Lab orders placed from triage:    ?

## 2021-07-05 ENCOUNTER — Inpatient Hospital Stay (HOSPITAL_COMMUNITY)
Admission: AD | Admit: 2021-07-05 | Discharge: 2021-07-06 | Disposition: A | Discharge status: Home / Self Care | Payer: Medicaid Other | Attending: Obstetrics and Gynecology | Admitting: Obstetrics and Gynecology

## 2021-07-05 ENCOUNTER — Other Ambulatory Visit: Payer: Self-pay

## 2021-07-05 DIAGNOSIS — O09292 Supervision of pregnancy with other poor reproductive or obstetric history, second trimester: Secondary | ICD-10-CM | POA: Insufficient documentation

## 2021-07-05 DIAGNOSIS — Z3689 Encounter for other specified antenatal screening: Secondary | ICD-10-CM

## 2021-07-05 DIAGNOSIS — Z7984 Long term (current) use of oral hypoglycemic drugs: Secondary | ICD-10-CM | POA: Insufficient documentation

## 2021-07-05 DIAGNOSIS — K5904 Chronic idiopathic constipation: Secondary | ICD-10-CM

## 2021-07-05 DIAGNOSIS — O99612 Diseases of the digestive system complicating pregnancy, second trimester: Secondary | ICD-10-CM | POA: Insufficient documentation

## 2021-07-05 DIAGNOSIS — O34219 Maternal care for unspecified type scar from previous cesarean delivery: Secondary | ICD-10-CM | POA: Insufficient documentation

## 2021-07-05 DIAGNOSIS — Z3A25 25 weeks gestation of pregnancy: Secondary | ICD-10-CM

## 2021-07-05 DIAGNOSIS — O24112 Pre-existing diabetes mellitus, type 2, in pregnancy, second trimester: Secondary | ICD-10-CM | POA: Insufficient documentation

## 2021-07-05 NOTE — MAU Note (Signed)
PT SAYS LOWER ABD HURTS - STARTED TODAY 4 PM ?[facility] ?DID NOT CALL OFFICE ?LAST SEX- MARCH  ?

## 2021-07-06 ENCOUNTER — Encounter (HOSPITAL_COMMUNITY): Payer: Self-pay | Admitting: Obstetrics and Gynecology

## 2021-07-06 DIAGNOSIS — O34219 Maternal care for unspecified type scar from previous cesarean delivery: Secondary | ICD-10-CM | POA: Diagnosis not present

## 2021-07-06 DIAGNOSIS — O09292 Supervision of pregnancy with other poor reproductive or obstetric history, second trimester: Secondary | ICD-10-CM | POA: Diagnosis not present

## 2021-07-06 DIAGNOSIS — Z3A25 25 weeks gestation of pregnancy: Secondary | ICD-10-CM | POA: Diagnosis not present

## 2021-07-06 DIAGNOSIS — O24112 Pre-existing diabetes mellitus, type 2, in pregnancy, second trimester: Secondary | ICD-10-CM | POA: Diagnosis not present

## 2021-07-06 DIAGNOSIS — K5904 Chronic idiopathic constipation: Secondary | ICD-10-CM | POA: Diagnosis not present

## 2021-07-06 DIAGNOSIS — O26892 Other specified pregnancy related conditions, second trimester: Secondary | ICD-10-CM | POA: Diagnosis present

## 2021-07-06 DIAGNOSIS — Z7984 Long term (current) use of oral hypoglycemic drugs: Secondary | ICD-10-CM | POA: Diagnosis not present

## 2021-07-06 DIAGNOSIS — O99612 Diseases of the digestive system complicating pregnancy, second trimester: Secondary | ICD-10-CM | POA: Diagnosis not present

## 2021-07-06 LAB — URINALYSIS, ROUTINE W REFLEX MICROSCOPIC
Bilirubin Urine: NEGATIVE
Glucose, UA: NEGATIVE mg/dL
Hgb urine dipstick: NEGATIVE
Ketones, ur: 20 mg/dL — AB
Leukocytes,Ua: NEGATIVE
Nitrite: NEGATIVE
Protein, ur: NEGATIVE mg/dL
Specific Gravity, Urine: 1.02 (ref 1.005–1.030)
pH: 5 (ref 5.0–8.0)

## 2021-07-06 LAB — RESPIRATORY PANEL BY PCR

## 2021-07-06 MED ORDER — POLYETHYLENE GLYCOL 3350 17 G PO PACK: 17.0000 g | PACK | Freq: Every day | ORAL | 2 refills | Status: DC

## 2021-07-06 MED ORDER — CYCLOBENZAPRINE HCL 5 MG PO TABS
5.0000 mg | ORAL_TABLET | Freq: Once | ORAL | Status: AC
Start: 2021-07-06 — End: 2021-07-06
  Administered 2021-07-06: 5 mg via ORAL
  Filled 2021-07-06: qty 1

## 2021-07-06 MED ORDER — ACETAMINOPHEN 500 MG PO TABS
1000.0000 mg | ORAL_TABLET | Freq: Once | ORAL | Status: AC
  Administered 2021-07-06: 1000 mg via ORAL
  Filled 2021-07-06: qty 2

## 2021-07-06 NOTE — Discharge Instructions (Signed)
You have constipation which is hard stools that are difficult to pass. It is important to have regular bowel movements every 1-3 days that are soft and easy to pass. Hard stools increase your risk of hemorrhoids and are very uncomfortable.  ° °To prevent constipation you can increase the amount of fiber in your diet. Examples of foods with fiber are leafy greens, whole grain breads, oatmeal and other grains.  It is also important to drink at least eight 8oz glass of water everyday.  ° °If you have not has a bowel movement in 4-5 days you made need to clean out your bowel.  This will have establish normal movement through your bowel.   ° °Miralax Clean out °· Take 8 capfuls of miralax in 64 oz of gatorade. You can use any fluid that appeals to you (gatorade, water, juice) °· Continue to drink at least eight 8 oz glasses of water throughout the day °· You can repeat with another 8 capfuls of miralax in 64 oz of gatorade if you are not having a large amount of stools °· You will need to be at home and close to a bathroom for about 8 hours when you do the above as you may need to go to the bathroom frequently.  ° °After you are cleaned out: °- Start Colace100mg twice daily °- Start Miralax once daily °- Start a daily fiber supplement like metamucil or citrucel °- You can safely use enemas in pregnancy  °- if you are having diarrhea you can reduce to Colace once a day or miralax every other day or a 1/2 capful daily.  °

## 2021-07-06 NOTE — MAU Provider Note (Signed)
Chief Complaint:  Contractions ? ? Event Date/Time  ? First Provider Initiated Contact with Patient 07/06/21 0009   ?  ?HPI: Stephanie Mack is a 31 y.o. [phone] at 55w0dwho presents to maternity admissions reporting feeling ill starting this afternoon around 4pm. She has been battling allergies, went to the beach over the weekend and starting feeling rundown today. Felt feverish at home but didn't take her temp (thermometer not working) or Tylenol (does not have meds at home). Endorses lower abdominal pain she thinks is related to constipation, she does not feel uterine cramping, says it feels more like gas. Had a regular BM yesterday, tried to go this morning and only a little came out. Has been uncomfortable and only passing gas ever since. Denies vaginal bleeding, leaking of fluid, decreased fetal movement, fever, falls, or other recent illness.  ? ?[facility] Course: [facility] and [facility] records reviewed. Has pregestational diabetes (on metformin), hx of gHTN (ok so far this pregnancy) and a hx of a classical cesarean at 25wks for fetal distress due to severe preeclampsia (baby passed). ? ?Past Medical History:  ?Diagnosis Date  ? ADHD (attention deficit hyperactivity disorder)   ? Anemia   ? Anxiety   ? Bipolar 1 disorder ([place]   ? Chlamydia 05/31/2010  ? Chronic constipation   ? Depression   ? depression  ? GERD (gastroesophageal reflux disease)   ? with pregnancy  ? Gestational diabetes   ? current pregnancy  ? Gonorrhea contact, treated   ? Headache   ? Hypertension   ? Infection   ? UTI  ? Pregnancy induced hypertension   ? previous pregnancy  ? Pseudocyesis 2013  ? Seen in MAU for percieved FM, abd distension. Normal exam.   ? Sickle cell trait ([place]   ? Type 2 diabetes mellitus ([place] 02/13/2017  ? ?OB History  ?Gravida Para Term Preterm AB Living  ?4 2 0 '2 1 1  '$ ?SAB IAB Ectopic Multiple Live Births  ?1 0 0 0 2  ?  ?# Outcome Date GA Lbr Len/2nd Weight Sex Delivery Anes  PTL Lv  ?4 Current           ?3 Preterm 01/07/15 350w0d6 lb 3.8 oz (2.829 kg) F CS-Vac Spinal  LIV  ?2 Preterm 09/01/12 2553w3dF CS-LTranv Spinal  DEC  ?1 SAB 09/16/09 6w531w5d     ?   Birth Comments: Twin gestational sacs.   ? ?Past Surgical History:  ?Procedure Laterality Date  ? CESAREAN SECTION N/A 09/01/2012  ? Procedure:  Primary cesarean section with delivery of baby girl at 174130pgars 1/1.  ;  Surgeon: BernFrederico Hamman;  [facility];  Service: Obstetrics;  Laterality: N/A;  ? CESAREAN SECTION N/A 01/07/2015  ? Procedure: REPEAT CESAREAN SECTION;  Surgeon: CharShelly Bombard;  [facility];  Service: Obstetrics;  Laterality: N/A;  ? NASAL SEPTUM SURGERY    ? WISDOM TOOTH EXTRACTION    ? ?Family History  ?Problem Relation Age of Onset  ? Kidney disease Mother   ? Hypertension Father   ? Cancer Sister   ? Asthma Brother   ? Diabetes Maternal Grandfather   ? Heart disease Neg Hx   ? ?Social History  ? ?Tobacco Use  ? Smoking status: Never  ? Smokeless tobacco: Never  ?Vaping Use  ? Vaping Use: Never used  ?Substance Use Topics  ? Alcohol use:  No  ?  Alcohol/week: 0.0 standard drinks  ? Drug use: No  ? ?No Known Allergies ?No medications prior to admission.  ? ?I have reviewed patient's Past Medical Hx, Surgical Hx, Family Hx, Social Hx, medications and allergies.  ? ?ROS:  ?Pertinent items noted in HPI and remainder of comprehensive ROS otherwise negative.  ? ?Physical Exam  ?Patient Vitals for the past 24 hrs: ? BP Temp Temp src Pulse Resp Height Weight  ?07/06/21 0110 128/61 -- -- (!) 114 18 -- --  ?07/05/21 2343 122/66 99.1 ?F (37.3 ?C) Oral (!) 120 20 '5\' 2"'$  (1.575 m) 160 lb 6.4 oz (72.8 kg)  ? ?Physical Exam ?Vitals and nursing note reviewed.  ?Constitutional:   ?   Appearance: Normal appearance. She is normal weight. She is diaphoretic.  ?HENT:  ?   Head: Normocephalic and atraumatic.  ?   Nose: Congestion present.  ?   Mouth/Throat:  ?   Mouth: Mucous membranes are moist.  ?Eyes:  ?    Pupils: Pupils are equal, round, and reactive to light.  ?Cardiovascular:  ?   Rate and Rhythm: Regular rhythm. Tachycardia present.  ?   Pulses: Normal pulses.  ?Pulmonary:  ?   Effort: Pulmonary effort is normal.  ?Abdominal:  ?   General: Bowel sounds are normal. There is no distension.  ?   Palpations: Abdomen is soft.  ?   Tenderness: There is no abdominal tenderness.  ?Musculoskeletal:     ?   General: Normal range of motion.  ?Skin: ?   General: Skin is warm.  ?   Capillary Refill: Capillary refill takes less than 2 seconds.  ?Neurological:  ?   Mental Status: She is alert and oriented to person, place, and time.  ?Psychiatric:     ?   Mood and Affect: Mood normal.     ?   Behavior: Behavior normal.     ?   Thought Content: Thought content normal.     ?   Judgment: Judgment normal.  ?  ?Fetal Tracing: reactive ?Baseline: 150 ?Variability: minimal but appropriate for gestational age ?Accelerations: 10x10 ?Decelerations: occasional variable (again, appropriate for gestational age) ?Toco: relaxed ?  ?Labs: ?Results for orders placed or performed during the hospital encounter of 07/05/21 (from the past 24 hour(s))  ?Urinalysis, Routine w reflex microscopic Urine, Clean Catch     Status: Abnormal  ? Collection Time: 07/06/21 12:14 AM  ?Result Value Ref Range  ? Color, Urine YELLOW YELLOW  ? APPearance HAZY (A) CLEAR  ? Specific Gravity, Urine 1.020 1.005 - 1.030  ? pH 5.0 5.0 - 8.0  ? Glucose, UA NEGATIVE NEGATIVE mg/dL  ? Hgb urine dipstick NEGATIVE NEGATIVE  ? Bilirubin Urine NEGATIVE NEGATIVE  ? Ketones, ur 20 (A) NEGATIVE mg/dL  ? Protein, ur NEGATIVE NEGATIVE mg/dL  ? Nitrite NEGATIVE NEGATIVE  ? Leukocytes,Ua NEGATIVE NEGATIVE  ?Respiratory (~20 pathogens) panel by PCR     Status: None  ? Collection Time: 07/06/21 12:31 AM  ? Specimen: Nasopharyngeal Swab; Respiratory  ?Result Value Ref Range  ? Adenovirus NOT DETECTED NOT DETECTED  ? Coronavirus 229E NOT DETECTED NOT DETECTED  ? Coronavirus HKU1 NOT  DETECTED NOT DETECTED  ? Coronavirus NL63 NOT DETECTED NOT DETECTED  ? Coronavirus OC43 NOT DETECTED NOT DETECTED  ? Metapneumovirus NOT DETECTED NOT DETECTED  ? Rhinovirus / Enterovirus NOT DETECTED NOT DETECTED  ? Influenza A NOT DETECTED NOT DETECTED  ? Influenza B NOT DETECTED NOT DETECTED  ?  Parainfluenza Virus 1 NOT DETECTED NOT DETECTED  ? Parainfluenza Virus 2 NOT DETECTED NOT DETECTED  ? Parainfluenza Virus 3 NOT DETECTED NOT DETECTED  ? Parainfluenza Virus 4 NOT DETECTED NOT DETECTED  ? Respiratory Syncytial Virus NOT DETECTED NOT DETECTED  ? Bordetella pertussis NOT DETECTED NOT DETECTED  ? Bordetella Parapertussis NOT DETECTED NOT DETECTED  ? Chlamydophila pneumoniae NOT DETECTED NOT DETECTED  ? Mycoplasma pneumoniae NOT DETECTED NOT DETECTED  ? ? ?Imaging:  ?No results found. ? ?MAU Course: ?Orders Placed This Encounter  ?Procedures  ? Respiratory (~20 pathogens) panel by PCR  ? Urinalysis, Routine w reflex microscopic Urine, Clean Catch  ? Discharge patient  ? ?Meds ordered this encounter  ?Medications  ? acetaminophen (TYLENOL) tablet 1,000 mg  ? cyclobenzaprine (FLEXERIL) tablet 5 mg  ? polyethylene glycol (MIRALAX) 17 g packet  ?  Sig: Take 17 g by mouth daily.  ?  Dispense:  100 each  ?  Refill:  2  ?  Order Specific Question:   Supervising Provider  ?  Answer:   Donnamae Jude [[id number]]  ? ?MDM: ?Respiratory panel and urinalysis ordered. Encouraged oral hydration, ordered Tylenol for low grade fever, flexeril and soap suds enema for constipation. Will reassess abdominal cramping once constipation is resolved. No contractions or c/o uterine cramping, pelvic exam deferred. ? ?Constipation relieved by flexeril and enema, pt reported feeling much better and desiring to go home to bed. ? ?Assessment: ?1. Chronic idiopathic constipation   ?2. NST (non-stress test) reactive   ?3. [redacted] weeks gestation of pregnancy   ? ?Plan: ?Discharge home in stable condition with return precautions.  ?  ? Follow-up  Information   ? ? Ob/Gyn, [facility] Follow up.   ?Specialty: Obstetrics and Gynecology ?Why: as scheduled for ongoing prenatal care ?Contact information: ?[address] ?[address] ?[place] [phone] ?[phone]

## 2021-07-07 ENCOUNTER — Ambulatory Visit: Payer: Medicaid Other | Admitting: Registered"

## 2021-07-12 ENCOUNTER — Encounter (HOSPITAL_COMMUNITY): Payer: Self-pay | Admitting: Obstetrics and Gynecology

## 2021-07-12 ENCOUNTER — Other Ambulatory Visit: Payer: Self-pay

## 2021-07-12 ENCOUNTER — Inpatient Hospital Stay (HOSPITAL_COMMUNITY)
Admission: AD | Admit: 2021-07-12 | Discharge: 2021-07-13 | Disposition: A | Discharge status: Home / Self Care | Payer: Medicaid Other | Attending: Obstetrics and Gynecology | Admitting: Obstetrics and Gynecology

## 2021-07-12 DIAGNOSIS — E162 Hypoglycemia, unspecified: Secondary | ICD-10-CM | POA: Diagnosis not present

## 2021-07-12 DIAGNOSIS — R55 Syncope and collapse: Secondary | ICD-10-CM | POA: Diagnosis not present

## 2021-07-12 DIAGNOSIS — O99282 Endocrine, nutritional and metabolic diseases complicating pregnancy, second trimester: Secondary | ICD-10-CM | POA: Insufficient documentation

## 2021-07-12 DIAGNOSIS — O26892 Other specified pregnancy related conditions, second trimester: Secondary | ICD-10-CM | POA: Diagnosis not present

## 2021-07-12 DIAGNOSIS — Z3A25 25 weeks gestation of pregnancy: Secondary | ICD-10-CM | POA: Insufficient documentation

## 2021-07-12 DIAGNOSIS — E876 Hypokalemia: Secondary | ICD-10-CM | POA: Diagnosis not present

## 2021-07-12 DIAGNOSIS — Z794 Long term (current) use of insulin: Secondary | ICD-10-CM | POA: Diagnosis not present

## 2021-07-12 DIAGNOSIS — O24112 Pre-existing diabetes mellitus, type 2, in pregnancy, second trimester: Secondary | ICD-10-CM | POA: Diagnosis not present

## 2021-07-12 DIAGNOSIS — Z3A26 26 weeks gestation of pregnancy: Secondary | ICD-10-CM

## 2021-07-12 DIAGNOSIS — R519 Headache, unspecified: Secondary | ICD-10-CM

## 2021-07-12 LAB — CBC WITH DIFFERENTIAL/PLATELET
Abs Immature Granulocytes: 0.07 10*3/uL (ref 0.00–0.07)
Basophils Absolute: 0 10*3/uL (ref 0.0–0.1)
Basophils Relative: 0 %
Eosinophils Absolute: 0.1 10*3/uL (ref 0.0–0.5)
Eosinophils Relative: 1 %
HCT: 32.6 % — ABNORMAL LOW (ref 36.0–46.0)
Hemoglobin: 10.5 g/dL — ABNORMAL LOW (ref 12.0–15.0)
Immature Granulocytes: 1 %
Lymphocytes Relative: 32 %
Lymphs Abs: 3.1 10*3/uL (ref 0.7–4.0)
MCH: 26.4 pg (ref 26.0–34.0)
MCHC: 32.2 g/dL (ref 30.0–36.0)
MCV: 82.1 fL (ref 80.0–100.0)
Monocytes Absolute: 0.8 10*3/uL (ref 0.1–1.0)
Monocytes Relative: 8 %
Neutro Abs: 5.7 10*3/uL (ref 1.7–7.7)
Neutrophils Relative %: 58 %
Platelets: 254 10*3/uL (ref 150–400)
RBC: 3.97 MIL/uL (ref 3.87–5.11)
RDW: 13.3 % (ref 11.5–15.5)
WBC: 9.8 10*3/uL (ref 4.0–10.5)
nRBC: 0 % (ref 0.0–0.2)

## 2021-07-12 LAB — COMPREHENSIVE METABOLIC PANEL
ALT: 10 U/L (ref 0–44)
AST: 17 U/L (ref 15–41)
Albumin: 2.8 g/dL — ABNORMAL LOW (ref 3.5–5.0)
Alkaline Phosphatase: 49 U/L (ref 38–126)
Anion gap: 10 (ref 5–15)
BUN: <5 mg/dL — ABNORMAL LOW (ref 6–20)
CO2: 20 mmol/L — ABNORMAL LOW (ref 22–32)
Calcium: 8.7 mg/dL — ABNORMAL LOW (ref 8.9–10.3)
Chloride: 105 mmol/L (ref 98–111)
Creatinine, Ser: 0.53 mg/dL (ref 0.44–1.00)
GFR, Estimated: >60 mL/min (ref >60)
Glucose, Bld: 92 mg/dL (ref 70–99)
Potassium: 3.2 mmol/L — ABNORMAL LOW (ref 3.5–5.1)
Sodium: 135 mmol/L (ref 135–145)
Total Bilirubin: 0.2 mg/dL — ABNORMAL LOW (ref 0.3–1.2)
Total Protein: 6.5 g/dL (ref 6.5–8.1)

## 2021-07-12 LAB — URINALYSIS, ROUTINE W REFLEX MICROSCOPIC
Bilirubin Urine: NEGATIVE
Glucose, UA: NEGATIVE mg/dL
Hgb urine dipstick: NEGATIVE
Ketones, ur: NEGATIVE mg/dL
Nitrite: NEGATIVE
Protein, ur: NEGATIVE mg/dL
Specific Gravity, Urine: 1.012 (ref 1.005–1.030)
pH: 7 (ref 5.0–8.0)

## 2021-07-12 LAB — MAGNESIUM: Magnesium: 1.8 mg/dL (ref 1.7–2.4)

## 2021-07-12 LAB — GLUCOSE, CAPILLARY: Glucose-Capillary: 104 mg/dL — ABNORMAL HIGH (ref 70–99)

## 2021-07-12 MED ORDER — ACETAMINOPHEN 500 MG PO TABS
1000.0000 mg | ORAL_TABLET | Freq: Once | ORAL | Status: AC
Start: 2021-07-12 — End: 2021-07-12
  Administered 2021-07-12: 1000 mg via ORAL
  Filled 2021-07-12: qty 2

## 2021-07-12 MED ORDER — LACTATED RINGERS IV BOLUS
500.0000 mL | Freq: Once | INTRAVENOUS | Status: AC
  Administered 2021-07-12: 500 mL via INTRAVENOUS

## 2021-07-12 MED ORDER — LACTATED RINGERS IV BOLUS: 1000.0000 mL | Freq: Once | INTRAVENOUS | Status: DC

## 2021-07-12 NOTE — MAU Provider Note (Signed)
?History  ?  ? ?CSN: [id number] ? ?Arrival date and time: 07/12/21 2144 ? ? None  ?  ? ?Chief Complaint  ?Patient presents with  ? Loss of Consciousness  ? ?HPI ?31 yo F [id number] at 83w6dwho presents after having a witnessed episode of passing out at home. Patient has a hx of diabetes and was previously on insulin however due to hypoglycemic episodes insulin stopped last week and currently only on Metformin ? ? ?#Passed out ?- last insulin Friday, passed out and BG 71 and then 44 - EMS checked BG ?- at that time BG improved with EMS intervention so did not come in to MAU until today ?- Since then Metformin only- took '1000mg'$  BID ?- Took metformin at 7 or 8 PM ?- reports she was laying in bed and was briefly not responding to stimuli by family ?- no prior syncopal episode ?- no chest pain ?- no shortness of breath ?- feels fatigued ?- last PO fluid intake 4 bottles of water ?- did not have any body movements that were witnessed ?- did not have any post ictal symptoms ?- currently has a headache that is 8/10 ? ?OB History   ? ? Gravida  ?4  ? Para  ?2  ? Term  ?0  ? Preterm  ?2  ? AB  ?1  ? Living  ?1  ?  ? ? SAB  ?1  ? IAB  ?0  ? Ectopic  ?0  ? Multiple  ?0  ? Live Births  ?2  ?   ?  ?  ? ? ?Past Medical History:  ?Diagnosis Date  ? ADHD (attention deficit hyperactivity disorder)   ? Anemia   ? Anxiety   ? Bipolar 1 disorder ([place]   ? Chlamydia 05/31/2010  ? Chronic constipation   ? Depression   ? depression  ? GERD (gastroesophageal reflux disease)   ? with pregnancy  ? Gestational diabetes   ? current pregnancy  ? Gonorrhea contact, treated   ? Headache   ? Hypertension   ? Infection   ? UTI  ? Pregnancy induced hypertension   ? previous pregnancy  ? Pseudocyesis 2013  ? Seen in MAU for percieved FM, abd distension. Normal exam.   ? Sickle cell trait ([place]   ? Type 2 diabetes mellitus ([place] 02/13/2017  ? ? ?Past Surgical History:  ?Procedure Laterality Date  ? CESAREAN SECTION N/A 09/01/2012  ? Procedure:  Primary  cesarean section with delivery of baby girl at 160  Apgars 1/1.  ;  Surgeon: BFrederico Hamman MD;  [facility];  Service: Obstetrics;  Laterality: N/A;  ? CESAREAN SECTION N/A 01/07/2015  ? Procedure: REPEAT CESAREAN SECTION;  Surgeon: CShelly Bombard MD;  [facility];  Service: Obstetrics;  Laterality: N/A;  ? NASAL SEPTUM SURGERY    ? WISDOM TOOTH EXTRACTION    ? ? ?Family History  ?Problem Relation Age of Onset  ? Kidney disease Mother   ? Vision loss Father   ? Hypertension Father   ? Cancer Sister   ? Asthma Brother   ? Diabetes Maternal Grandfather   ? Heart disease Neg Hx   ? ? ?Social History  ? ?Tobacco Use  ? Smoking status: Never  ? Smokeless tobacco: Never  ?Vaping Use  ? Vaping Use: Never used  ?Substance Use Topics  ? Alcohol use: No  ?  Alcohol/week: 0.0 standard drinks  ? Drug use: No  ? ? ?Allergies:  No Known Allergies ? ?No medications prior to admission.  ? ? ?Review of Systems  ?Constitutional:  Negative for chills, diaphoresis and fever.  ?Eyes:  Negative for photophobia and visual disturbance.  ?Respiratory:  Negative for shortness of breath.   ?Cardiovascular:  Negative for chest pain, palpitations and leg swelling.  ?Gastrointestinal:  Negative for abdominal pain.  ?Neurological:  Positive for syncope and headaches. Negative for tremors, seizures, speech difficulty, weakness, light-headedness and numbness.  ?Physical Exam  ? ?Blood pressure 132/74, pulse 82, temperature 98.2 ?F (36.8 ?C), resp. rate 17, last menstrual period 01/21/2021, SpO2 100 %. ? ?Physical Exam ?Vitals and nursing note reviewed.  ?HENT:  ?   Head: Normocephalic.  ?Cardiovascular:  ?   Rate and Rhythm: Normal rate.  ?   Pulses: Normal pulses.  ?Pulmonary:  ?   Effort: Pulmonary effort is normal.  ?Abdominal:  ?   General: Abdomen is flat.  ?Skin: ?   General: Skin is warm.  ?   Capillary Refill: Capillary refill takes less than 2 seconds.  ?Neurological:  ?   General: No focal deficit present.  ?   Mental  Status: She is alert and oriented to person, place, and time.  ?   Cranial Nerves: No cranial nerve deficit.  ?Psychiatric:     ?   Mood and Affect: Mood normal.  ? ? ?MAU Course  ?Procedures ?NST appropriate for GA ? ?MDM ?Patient with one episode of witnessed decreased consciousness per family member. Patient was laying in bed when this happened and her BG per EMS was initially in the 80s. Had similar episode of decreased consciousness a few days ago when her BG was in 70s at which time her insulin was stopped. Today took Metformin prior to episode. Orthostatics normal. EKG with NSR. Unclear exact etiology of episode however neurologically intact, no signs of seizures, and no cardiac symptoms with normal EKG therefore lower suspicion for neurological or cardiac etiology. Possibly related to hypoglycemia. Discussed checking BG (fasting) and post prandial. Discussed taking '500mg'$  BID of Metformin instead of '1000mg'$  BID and reaching out to [facility] for further management of diabetes.  ? ?Assessment and Plan  ?Syncope ?Unclear exact etiology of episode however neurologically intact, no signs of seizures, and no cardiac symptoms with normal EKG therefore lower suspicion for neurological or cardiac etiology. Possibly related to transient hypoglycemia. Discussed checking BG (fasting) and post prandial.  ?Discussed taking '500mg'$  BID of Metformin instead of '1000mg'$  BID ?Reaching out to [facility] for further management of diabetes.  ?Note forwarded to [facility] ? ?2. Headache ?Improved with Tylenol. PreE labs normal. Discussed return precautions.  ? ?3. Mild HypoK ?Sent K to patient's pharmacy for 5 day course. ? ? ?Renard Matter, MD, MPH ?OB Fellow, Faculty [facility] ? ?

## 2021-07-12 NOTE — MAU Note (Signed)
.  Stephanie Mack is a 31 y.o. at 20w6dhere in MAU reporting: pt arrives via EMS from home. Pt reports a syncopal episode at home. States she has not felt well all day and was at home in the bed and her family says she passed out. Denies injury.  ?Onset of complaint: 2100 ?Pain score: 9/10 ?Vitals:  ? 07/12/21 2157  ?BP: 130/83  ?Pulse: 82  ?Resp: 17  ?Temp: 98.2 ?F (36.8 ?C)  ?SpO2: 100%  ?   ?[id number]?Lab orders placed from triage urine:   ? ?

## 2021-07-13 DIAGNOSIS — E162 Hypoglycemia, unspecified: Secondary | ICD-10-CM | POA: Diagnosis not present

## 2021-07-13 LAB — PROTEIN / CREATININE RATIO, URINE
Creatinine, Urine: 80.46 mg/dL
Protein Creatinine Ratio: 0.17 mg/mg{Cre} — ABNORMAL HIGH (ref 0.00–0.15)
Total Protein, Urine: 14 mg/dL

## 2021-07-13 MED ORDER — POTASSIUM CHLORIDE CRYS ER 10 MEQ PO TBCR: 10.0000 meq | EXTENDED_RELEASE_TABLET | Freq: Every day | ORAL | 0 refills | Status: DC

## 2021-07-13 NOTE — Discharge Instructions (Signed)
You came to the MAU because you were told you passed out. They checked your blood sugar with EMS and it was in the 80s. Here your blood sugar was in the low 100s. We did an EKG which was normal and your other blood work was stable. It is possible your blood sugar got low and that is why you passed out. Please reach out to your doctor's office so they can change your blood sugar medicine if appropriate. ? ?Your potassium was a little low so I sent a few days of potassium medication to your pharmacy. ? ?Please seek medical care if you have lightheadedness, dizziness, pass out again, or your blood sugars are less than 70. Also seek medical care if you have significant vaginal bleeding or feel like your water is broken. ? ? ?

## 2021-07-14 ENCOUNTER — Other Ambulatory Visit: Payer: Self-pay | Admitting: *Deleted

## 2021-07-14 ENCOUNTER — Ambulatory Visit: Payer: Medicaid Other | Admitting: *Deleted

## 2021-07-14 ENCOUNTER — Ambulatory Visit: Payer: Medicaid Other | Attending: Obstetrics

## 2021-07-14 VITALS — BP 139/71 | HR 84

## 2021-07-14 DIAGNOSIS — O99342 Other mental disorders complicating pregnancy, second trimester: Secondary | ICD-10-CM

## 2021-07-14 DIAGNOSIS — Z3A26 26 weeks gestation of pregnancy: Secondary | ICD-10-CM

## 2021-07-14 DIAGNOSIS — F319 Bipolar disorder, unspecified: Secondary | ICD-10-CM

## 2021-07-14 DIAGNOSIS — O24112 Pre-existing diabetes mellitus, type 2, in pregnancy, second trimester: Secondary | ICD-10-CM

## 2021-07-14 DIAGNOSIS — O360120 Maternal care for anti-D [Rh] antibodies, second trimester, not applicable or unspecified: Secondary | ICD-10-CM

## 2021-07-14 DIAGNOSIS — O98512 Other viral diseases complicating pregnancy, second trimester: Secondary | ICD-10-CM | POA: Diagnosis not present

## 2021-07-14 DIAGNOSIS — O09292 Supervision of pregnancy with other poor reproductive or obstetric history, second trimester: Secondary | ICD-10-CM

## 2021-07-14 DIAGNOSIS — O24912 Unspecified diabetes mellitus in pregnancy, second trimester: Secondary | ICD-10-CM

## 2021-07-14 DIAGNOSIS — Z8759 Personal history of other complications of pregnancy, childbirth and the puerperium: Secondary | ICD-10-CM

## 2021-07-14 DIAGNOSIS — O09212 Supervision of pregnancy with history of pre-term labor, second trimester: Secondary | ICD-10-CM

## 2021-07-14 DIAGNOSIS — O24319 Unspecified pre-existing diabetes mellitus in pregnancy, unspecified trimester: Secondary | ICD-10-CM | POA: Diagnosis present

## 2021-07-14 DIAGNOSIS — O34219 Maternal care for unspecified type scar from previous cesarean delivery: Secondary | ICD-10-CM

## 2021-07-14 DIAGNOSIS — Z363 Encounter for antenatal screening for malformations: Secondary | ICD-10-CM

## 2021-07-14 DIAGNOSIS — B009 Herpesviral infection, unspecified: Secondary | ICD-10-CM

## 2021-07-19 ENCOUNTER — Other Ambulatory Visit: Payer: Self-pay | Admitting: Obstetrics & Gynecology

## 2021-07-21 ENCOUNTER — Encounter: Payer: Medicaid Other | Attending: Obstetrics and Gynecology | Admitting: Registered"

## 2021-07-23 ENCOUNTER — Other Ambulatory Visit: Payer: Self-pay

## 2021-07-23 ENCOUNTER — Inpatient Hospital Stay (HOSPITAL_COMMUNITY)
Admission: AD | Admit: 2021-07-23 | Discharge: 2021-07-23 | Disposition: A | Discharge status: Home / Self Care | Payer: Medicaid Other | Attending: Obstetrics & Gynecology | Admitting: Obstetrics & Gynecology

## 2021-07-23 ENCOUNTER — Encounter (HOSPITAL_COMMUNITY): Payer: Self-pay | Admitting: Obstetrics & Gynecology

## 2021-07-23 DIAGNOSIS — O219 Vomiting of pregnancy, unspecified: Secondary | ICD-10-CM

## 2021-07-23 DIAGNOSIS — O26892 Other specified pregnancy related conditions, second trimester: Secondary | ICD-10-CM | POA: Insufficient documentation

## 2021-07-23 DIAGNOSIS — R531 Weakness: Secondary | ICD-10-CM | POA: Insufficient documentation

## 2021-07-23 DIAGNOSIS — O212 Late vomiting of pregnancy: Secondary | ICD-10-CM | POA: Diagnosis present

## 2021-07-23 DIAGNOSIS — R109 Unspecified abdominal pain: Secondary | ICD-10-CM | POA: Diagnosis not present

## 2021-07-23 DIAGNOSIS — Z3A27 27 weeks gestation of pregnancy: Secondary | ICD-10-CM | POA: Diagnosis not present

## 2021-07-23 DIAGNOSIS — O24415 Gestational diabetes mellitus in pregnancy, controlled by oral hypoglycemic drugs: Secondary | ICD-10-CM | POA: Diagnosis not present

## 2021-07-23 LAB — URINALYSIS, ROUTINE W REFLEX MICROSCOPIC
Bilirubin Urine: NEGATIVE
Glucose, UA: NEGATIVE mg/dL
Hgb urine dipstick: NEGATIVE
Ketones, ur: NEGATIVE mg/dL
Leukocytes,Ua: NEGATIVE
Nitrite: NEGATIVE
Protein, ur: NEGATIVE mg/dL
Specific Gravity, Urine: 1.008 (ref 1.005–1.030)
pH: 6 (ref 5.0–8.0)

## 2021-07-23 LAB — GLUCOSE, CAPILLARY: Glucose-Capillary: 94 mg/dL (ref 70–99)

## 2021-07-23 MED ORDER — ONDANSETRON 4 MG PO TBDP
4.0000 mg | ORAL_TABLET | Freq: Once | ORAL | Status: AC
  Administered 2021-07-23: 4 mg via ORAL
  Filled 2021-07-23: qty 1

## 2021-07-23 MED ORDER — ONDANSETRON 4 MG PO TBDP: 4.0000 mg | ORAL_TABLET | Freq: Four times a day (QID) | ORAL | 0 refills | Status: DC | PRN

## 2021-07-23 NOTE — MAU Provider Note (Signed)
Chief Complaint:  Nausea, Emesis, and Abdominal Pain ? ? Event Date/Time  ? First Provider Initiated Contact with Patient 07/23/21 1804   ?  ? ?HPI: Stephanie Mack is a 31 y.o. [phone] at 7w3dwho presents to maternity admissions reporting nausea, vomiting, and weakness today and her diabetes meter is not working.  She reports vomiting x 3 today and does not have any nausea medications.  She reports her glucose values are mostly normal, with 1 postprandial of 127, most below 120 in the last week.  She is taking Metformin BID as prescribed.  There are no there symptoms.   ?She reports good fetal movement. ? ? ?HPI ? ?Past Medical History: ?Past Medical History:  ?Diagnosis Date  ? ADHD (attention deficit hyperactivity disorder)   ? Anemia   ? Anxiety   ? Bipolar 1 disorder ([place]   ? Chlamydia 05/31/2010  ? Chronic constipation   ? Depression   ? depression  ? GERD (gastroesophageal reflux disease)   ? with pregnancy  ? Gestational diabetes   ? current pregnancy  ? Gonorrhea contact, treated   ? Headache   ? Hypertension   ? Infection   ? UTI  ? Pregnancy induced hypertension   ? previous pregnancy  ? Pseudocyesis 2013  ? Seen in MAU for percieved FM, abd distension. Normal exam.   ? Sickle cell trait ([place]   ? Type 2 diabetes mellitus ([place] 02/13/2017  ? ? ?Past obstetric history: ?OB History  ?Gravida Para Term Preterm AB Living  ?4 2 0 2 1 1   ?SAB IAB Ectopic Multiple Live Births  ?1 0 0 0 2  ?  ?# Outcome Date GA Lbr Len/2nd Weight Sex Delivery Anes PTL Lv  ?4 Current           ?3 Preterm 01/07/15 310w0d2829 g F CS-Vac Spinal  LIV  ?2 Preterm 09/01/12 2582w3dF CS-LTranv Spinal  DEC  ?1 SAB 09/16/09 6w578w5d     ?   Birth Comments: Twin gestational sacs.   ? ? ?Past Surgical History: ?Past Surgical History:  ?Procedure Laterality Date  ? CESAREAN SECTION N/A 09/01/2012  ? Procedure:  Primary cesarean section with delivery of baby girl at 174165pgars 1/1.  ;  Surgeon: BernFrederico Hamman;  [facility];   Service: Obstetrics;  Laterality: N/A;  ? CESAREAN SECTION N/A 01/07/2015  ? Procedure: REPEAT CESAREAN SECTION;  Surgeon: CharShelly Bombard;  [facility];  Service: Obstetrics;  Laterality: N/A;  ? NASAL SEPTUM SURGERY    ? WISDOM TOOTH EXTRACTION    ? ? ?Family History: ?Family History  ?Problem Relation Age of Onset  ? Kidney disease Mother   ? Vision loss Father   ? Hypertension Father   ? Cancer Sister   ? Asthma Brother   ? Diabetes Maternal Grandfather   ? Heart disease Neg Hx   ? ? ?Social History: ?Social History  ? ?Tobacco Use  ? Smoking status: Never  ? Smokeless tobacco: Never  ?Vaping Use  ? Vaping Use: Never used  ?Substance Use Topics  ? Alcohol use: No  ?  Alcohol/week: 0.0 standard drinks  ? Drug use: No  ? ? ?Allergies: No Known Allergies ? ?Meds:  ?Medications Prior to Admission  ?Medication Sig Dispense Refill Last Dose  ? cholecalciferol (VITAMIN D3) 25 MCG (1000 UNIT) tablet Take 1,000 Units by mouth daily.   07/23/2021  ? metFORMIN (GLUCOPHAGE-XR)  500 MG 24 hr tablet Take 2 tablets po daily after breakfast and after supper. 120 tablet 11 07/23/2021  ? NIFEdipine (PROCARDIA) 20 MG capsule Take 20 mg by mouth once.   07/23/2021  ? ACCU-CHEK FASTCLIX LANCETS MISC TEST UPTO 4 TIMES DAILY 102 each 3   ? aspirin EC 81 MG tablet Take 81 mg by mouth daily. Swallow whole.     ? blood glucose meter kit and supplies KIT Dispense based on patient and insurance preference. Use up to four times daily as directed. (FOR ICD-9 250.00, 250.01). 1 each 0   ? cyclobenzaprine (FLEXERIL) 10 MG tablet Take 1 tablet (10 mg total) by mouth 2 (two) times daily as needed for muscle spasms. 20 tablet 0   ? glucose blood test strip TEST UPTO 4 TIMES DAILY 100 each 3   ? polyethylene glycol (MIRALAX) 17 g packet Take 17 g by mouth daily. 100 each 2   ? potassium chloride (KLOR-CON M) 10 MEQ tablet Take 1 tablet (10 mEq total) by mouth daily for 5 doses. 5 tablet 0   ? Prenat-Fe Poly-Methfol-FA-DHA (VITAFOL ULTRA)  29-0.6-0.4-200 MG CAPS Take 1 capsule by mouth daily before breakfast. 90 capsule 4   ? ? ?ROS:  ?Review of Systems ? ? ?I have reviewed patient's Past Medical Hx, Surgical Hx, Family Hx, Social Hx, medications and allergies.  ? ?Physical Exam  ?Patient Vitals for the past 24 hrs: ? BP Temp Temp src Pulse Resp SpO2  ?07/23/21 1746 128/74 -- -- (!) 102 -- 98 %  ?07/23/21 1741 129/82 98.8 ?F (37.1 ?C) Oral (!) 107 15 --  ? ?Constitutional: Well-developed, well-nourished female in no acute distress.  ?Cardiovascular: normal rate ?Respiratory: normal effort ?GI: Abd soft, non-tender, gravid appropriate for gestational age.  ?MS: Extremities nontender, no edema, normal ROM ?Neurologic: Alert and oriented x 4.  ?GU: Neg CVAT. ? ?PELVIC EXAM: Cervix pink, visually closed, without lesion, scant white creamy discharge, vaginal walls and external genitalia normal ?Bimanual exam: Cervix 0/long/high, firm, anterior, neg CMT, uterus nontender, nonenlarged, adnexa without tenderness, enlargement, or mass ? ?  ? ?FHT:  Baseline 135, moderate variability, accelerations present, no decelerations ?Contractions: none on toco or to palpation ?  ?Labs: ?Results for orders placed or performed during the hospital encounter of 07/23/21 (from the past 24 hour(s))  ?Urinalysis, Routine w reflex microscopic Urine, Clean Catch     Status: Abnormal  ? Collection Time: 07/23/21  5:47 PM  ?Result Value Ref Range  ? Color, Urine STRAW (A) YELLOW  ? APPearance HAZY (A) CLEAR  ? Specific Gravity, Urine 1.008 1.005 - 1.030  ? pH 6.0 5.0 - 8.0  ? Glucose, UA NEGATIVE NEGATIVE mg/dL  ? Hgb urine dipstick NEGATIVE NEGATIVE  ? Bilirubin Urine NEGATIVE NEGATIVE  ? Ketones, ur NEGATIVE NEGATIVE mg/dL  ? Protein, ur NEGATIVE NEGATIVE mg/dL  ? Nitrite NEGATIVE NEGATIVE  ? Leukocytes,Ua NEGATIVE NEGATIVE  ?Glucose, capillary     Status: None  ? Collection Time: 07/23/21  6:14 PM  ?Result Value Ref Range  ? Glucose-Capillary 94 70 - 99 mg/dL  ? ?  ? ?Imaging:   ? ?MAU Course/MDM: ?Orders Placed This Encounter  ?Procedures  ? Urinalysis, Routine w reflex microscopic Urine, Clean Catch  ? Glucose, capillary  ? Discharge patient  ?  ?Meds ordered this encounter  ?Medications  ? ondansetron (ZOFRAN-ODT) disintegrating tablet 4 mg  ?  ? ?NST reviewed and appropriate for gestational age ?Pt with vomiting, but no ketones in  UA today ?Zofran ODT given in MAU ?POCT glucose 94  ?Pt to continue Metformin BID as prescribed and f/u with ObGyn office on Monday if meter is still not working and needs to be replaced ?Return to MAU as needed for emergencies ? ? ? ? ?Assessment: ?1. Nausea and vomiting during pregnancy   ?2. Gestational diabetes mellitus (GDM) in second trimester controlled on oral hypoglycemic drug   ?3. [redacted] weeks gestation of pregnancy   ? ? ?Plan: ?Discharge home ?Labor precautions and fetal kick counts ? ?Allergies as of 07/23/2021   ?No Known Allergies ?  ? ?  ?Medication List  ?  ? ?TAKE these medications   ? ?Accu-Chek FastClix Lancets Misc ?TEST UPTO 4 TIMES DAILY ?  ?aspirin EC 81 MG tablet ?Take 81 mg by mouth daily. Swallow whole. ?  ?blood glucose meter kit and supplies Kit ?Dispense based on patient and insurance preference. Use up to four times daily as directed. (FOR ICD-9 250.00, 250.01). ?  ?cholecalciferol 25 MCG (1000 UNIT) tablet ?Commonly known as: VITAMIN D3 ?Take 1,000 Units by mouth daily. ?  ?cyclobenzaprine 10 MG tablet ?Commonly known as: FLEXERIL ?Take 1 tablet (10 mg total) by mouth 2 (two) times daily as needed for muscle spasms. ?  ?glucose blood test strip ?TEST UPTO 4 TIMES DAILY ?  ?metFORMIN 500 MG 24 hr tablet ?Commonly known as: GLUCOPHAGE-XR ?Take 2 tablets po daily after breakfast and after supper. ?  ?NIFEdipine 20 MG capsule ?Commonly known as: PROCARDIA ?Take 20 mg by mouth once. ?  ?polyethylene glycol 17 g packet ?Commonly known as: MiraLax ?Take 17 g by mouth daily. ?  ?potassium chloride 10 MEQ tablet ?Commonly known as: KLOR-CON  M ?Take 1 tablet (10 mEq total) by mouth daily for 5 doses. ?  ?Vitafol Ultra 29-0.6-0.4-200 MG Caps ?Take 1 capsule by mouth daily before breakfast. ?  ? ?  ? ? ?Lattie Haw Leftwich-Kirby ?Certified Nurse-Mid

## 2021-07-23 NOTE — MAU Note (Signed)
...  Stephanie Mack is a 31 y.o. at 53w3dhere in MAU via EMS reporting: N/V since last night. Patient reports she tried to eat pizza earlier today and did not keep it down. Denies taking any medications. No VB or LOF. Endorses fetal movement but states she feels it "a little bit." ? ?Lab orders placed from triage: UA ? ?

## 2021-07-29 ENCOUNTER — Inpatient Hospital Stay (HOSPITAL_COMMUNITY)
Admission: AD | Admit: 2021-07-29 | Discharge: 2021-07-29 | Disposition: A | Discharge status: Home / Self Care | Payer: Medicaid Other | Attending: Obstetrics & Gynecology | Admitting: Obstetrics & Gynecology

## 2021-07-29 ENCOUNTER — Encounter (HOSPITAL_COMMUNITY): Payer: Self-pay | Admitting: Obstetrics & Gynecology

## 2021-07-29 DIAGNOSIS — K219 Gastro-esophageal reflux disease without esophagitis: Secondary | ICD-10-CM | POA: Diagnosis not present

## 2021-07-29 DIAGNOSIS — R109 Unspecified abdominal pain: Secondary | ICD-10-CM | POA: Diagnosis present

## 2021-07-29 DIAGNOSIS — O09893 Supervision of other high risk pregnancies, third trimester: Secondary | ICD-10-CM | POA: Diagnosis not present

## 2021-07-29 DIAGNOSIS — O24113 Pre-existing diabetes mellitus, type 2, in pregnancy, third trimester: Secondary | ICD-10-CM | POA: Insufficient documentation

## 2021-07-29 DIAGNOSIS — O26893 Other specified pregnancy related conditions, third trimester: Secondary | ICD-10-CM

## 2021-07-29 DIAGNOSIS — Z98891 History of uterine scar from previous surgery: Secondary | ICD-10-CM | POA: Insufficient documentation

## 2021-07-29 DIAGNOSIS — O99613 Diseases of the digestive system complicating pregnancy, third trimester: Secondary | ICD-10-CM | POA: Diagnosis not present

## 2021-07-29 DIAGNOSIS — Z7984 Long term (current) use of oral hypoglycemic drugs: Secondary | ICD-10-CM | POA: Diagnosis not present

## 2021-07-29 DIAGNOSIS — Z3A28 28 weeks gestation of pregnancy: Secondary | ICD-10-CM

## 2021-07-29 LAB — URINALYSIS, ROUTINE W REFLEX MICROSCOPIC
Bacteria, UA: NONE SEEN
Bilirubin Urine: NEGATIVE
Glucose, UA: 50 mg/dL — AB
Hgb urine dipstick: NEGATIVE
Ketones, ur: 5 mg/dL — AB
Leukocytes,Ua: NEGATIVE
Nitrite: NEGATIVE
Protein, ur: 30 mg/dL — AB
Specific Gravity, Urine: 1.024 (ref 1.005–1.030)
pH: 6 (ref 5.0–8.0)

## 2021-07-29 LAB — GLUCOSE, CAPILLARY: Glucose-Capillary: 89 mg/dL (ref 70–99)

## 2021-07-29 LAB — WET PREP, GENITAL
Clue Cells Wet Prep HPF POC: NONE SEEN
Sperm: NONE SEEN
Trich, Wet Prep: NONE SEEN
WBC, Wet Prep HPF POC: <10 (ref <10)
Yeast Wet Prep HPF POC: NONE SEEN

## 2021-07-29 MED ORDER — POLYETHYLENE GLYCOL 3350 17 GM/SCOOP PO POWD: 17.0000 g | Freq: Every day | ORAL | 1 refills | Status: DC | PRN

## 2021-07-29 NOTE — MAU Provider Note (Signed)
?History  ?  ? ?[id number] ? ?Arrival date and time: 07/29/21 1502 ?  ? ?Chief Complaint  ?Patient presents with  ? Abdominal Pain  ? ? ? ?HPI ?Stephanie Mack is a 31 y.o. at 71w2dby LMP with PMHx notable for prior classical cesarean, cesarean x2, T2DM, hx of PreE, who presents for abdominal pain.  ? ?Review of outside prenatal records from [facility](in media tab): unfortunately the only thing that has been scanned in is a tubal consent form ? ?She reports she has had three episodes of what she describes as abdominal tightening today ?Does not think they are contractions but is worried they might be ?No vaginal bleeding or leaking fluid ?Does endorse some new vaginal discharge that is different in the past few days ?Also endorses some mild nausea, and ongoing chronic constipation ?Last BM this morning, was large ?Denies burning or pain with urination ?No fevers ?Reports her sugars have been up and down ? ? ?  ? ?OB History   ? ? Gravida  ?4  ? Para  ?2  ? Term  ?0  ? Preterm  ?2  ? AB  ?1  ? Living  ?1  ?  ? ? SAB  ?1  ? IAB  ?0  ? Ectopic  ?0  ? Multiple  ?0  ? Live Births  ?2  ?   ?  ?  ? ? ?Past Medical History:  ?Diagnosis Date  ? ADHD (attention deficit hyperactivity disorder)   ? Anemia   ? Anxiety   ? Bipolar 1 disorder ([facility]   ? Chlamydia 05/31/2010  ? Chronic constipation   ? Depression   ? depression  ? GERD (gastroesophageal reflux disease)   ? with pregnancy  ? Gestational diabetes   ? current pregnancy  ? Gonorrhea contact, treated   ? Headache   ? Hypertension   ? Infection   ? UTI  ? Pregnancy induced hypertension   ? previous pregnancy  ? Pseudocyesis 2013  ? Seen in MAU for percieved FM, abd distension. Normal exam.   ? Sickle cell trait ([facility]   ? Type 2 diabetes mellitus ([facility] 02/13/2017  ? ? ?Past Surgical History:  ?Procedure Laterality Date  ? CESAREAN SECTION N/A 09/01/2012  ? Procedure:  Primary cesarean section with delivery of baby girl at 161  Apgars 1/1.  ;  Surgeon:  BFrederico Hamman MD;  [facility];  Service: Obstetrics;  Laterality: N/A;  ? CESAREAN SECTION N/A 01/07/2015  ? Procedure: REPEAT CESAREAN SECTION;  Surgeon: CShelly Bombard MD;  [facility];  Service: Obstetrics;  Laterality: N/A;  ? NASAL SEPTUM SURGERY    ? WISDOM TOOTH EXTRACTION    ? ? ?Family History  ?Problem Relation Age of Onset  ? Kidney disease Mother   ? Vision loss Father   ? Hypertension Father   ? Cancer Sister   ? Asthma Brother   ? Diabetes Maternal Grandfather   ? Heart disease Neg Hx   ? ? ?Social History  ? ?Socioeconomic History  ? Marital status: Single  ?  Spouse name: Not on file  ? Number of children: Not on file  ? Years of education: Not on file  ? Highest education level: Not on file  ?Occupational History  ? Not on file  ?Tobacco Use  ? Smoking status: Never  ? Smokeless tobacco: Never  ?Vaping Use  ? Vaping Use: Never used  ?Substance and Sexual Activity  ?  Alcohol use: No  ?  Alcohol/week: 0.0 standard drinks  ? Drug use: No  ? Sexual activity: Yes  ?  Partners: Male  ?  Birth control/protection: None  ?Other Topics Concern  ? Not on file  ?Social History Narrative  ? Single, one daughter born 2016  ?   ?    ? ?Social Determinants of Health  ? ?Financial Resource Strain: Not on file  ?Food Insecurity: Not on file  ?Transportation Needs: Not on file  ?Physical Activity: Not on file  ?Stress: Not on file  ?Social Connections: Not on file  ?Intimate Partner Violence: Not on file  ? ? ?No Known Allergies ? ?No current facility-administered medications on file prior to encounter.  ? ?Current Outpatient Medications on File Prior to Encounter  ?Medication Sig Dispense Refill  ? aspirin EC 81 MG tablet Take 81 mg by mouth daily. Swallow whole.    ? cholecalciferol (VITAMIN D3) 25 MCG (1000 UNIT) tablet Take 1,000 Units by mouth daily.    ? metFORMIN (GLUCOPHAGE-XR) 500 MG 24 hr tablet Take 2 tablets po daily after breakfast and after supper. 120 tablet 11  ? NIFEdipine  (PROCARDIA) 20 MG capsule Take 20 mg by mouth once.    ? Prenat-Fe Poly-Methfol-FA-DHA (VITAFOL ULTRA) 29-0.6-0.4-200 MG CAPS Take 1 capsule by mouth daily before breakfast. 90 capsule 4  ? ACCU-CHEK FASTCLIX LANCETS MISC TEST UPTO 4 TIMES DAILY 102 each 3  ? blood glucose meter kit and supplies KIT Dispense based on patient and insurance preference. Use up to four times daily as directed. (FOR ICD-9 250.00, 250.01). 1 each 0  ? cyclobenzaprine (FLEXERIL) 10 MG tablet Take 1 tablet (10 mg total) by mouth 2 (two) times daily as needed for muscle spasms. 20 tablet 0  ? glucose blood test strip TEST UPTO 4 TIMES DAILY 100 each 3  ? ondansetron (ZOFRAN-ODT) 4 MG disintegrating tablet Take 1 tablet (4 mg total) by mouth every 6 (six) hours as needed for nausea. 20 tablet 0  ? polyethylene glycol (MIRALAX) 17 g packet Take 17 g by mouth daily. 100 each 2  ? potassium chloride (KLOR-CON M) 10 MEQ tablet Take 1 tablet (10 mEq total) by mouth daily for 5 doses. 5 tablet 0  ? ? ? ?ROS ?Pertinent positives and negative per HPI, all others reviewed and negative ? ?Physical Exam  ? ?BP 132/72   Pulse (!) 102   Temp 98 ?F (36.7 ?C) (Oral)   Resp 17   LMP 01/21/2021   SpO2 96%  ? ?Patient Vitals for the past 24 hrs: ? BP Temp Temp src Pulse Resp SpO2  ?07/29/21 1532 132/72 -- -- (!) 102 -- 96 %  ?07/29/21 1517 138/77 98 ?F (36.7 ?C) Oral (!) 102 17 98 %  ? ? ?Physical Exam ?Vitals reviewed.  ?Constitutional:   ?   General: She is not in acute distress. ?   Appearance: She is well-developed. She is not diaphoretic.  ?Eyes:  ?   General: No scleral icterus. ?Pulmonary:  ?   Effort: Pulmonary effort is normal. No respiratory distress.  ?Abdominal:  ?   General: There is no distension.  ?   Palpations: Abdomen is soft.  ?   Tenderness: There is no abdominal tenderness. There is no guarding or rebound.  ?Skin: ?   General: Skin is warm and dry.  ?Neurological:  ?   Mental Status: She is alert.  ?   Coordination: Coordination  normal.  ?  ? ?  Cervical Exam ?  ? ?Bedside Ultrasound ?Not done ? ?My interpretation: n/a ? ?FHT ?Baseline 140, moderate variability, +accels, no decels ?Toco: flat ?Cat: I ? ?Labs ?Results for orders placed or performed during the hospital encounter of 07/29/21 (from the past 24 hour(s))  ?Urinalysis, Routine w reflex microscopic Urine, Clean Catch     Status: Abnormal  ? Collection Time: 07/29/21  3:18 PM  ?Result Value Ref Range  ? Color, Urine YELLOW YELLOW  ? APPearance HAZY (A) CLEAR  ? Specific Gravity, Urine 1.024 1.005 - 1.030  ? pH 6.0 5.0 - 8.0  ? Glucose, UA 50 (A) NEGATIVE mg/dL  ? Hgb urine dipstick NEGATIVE NEGATIVE  ? Bilirubin Urine NEGATIVE NEGATIVE  ? Ketones, ur 5 (A) NEGATIVE mg/dL  ? Protein, ur 30 (A) NEGATIVE mg/dL  ? Nitrite NEGATIVE NEGATIVE  ? Leukocytes,Ua NEGATIVE NEGATIVE  ? RBC / HPF 0-5 0 - 5 RBC/hpf  ? WBC, UA 0-5 0 - 5 WBC/hpf  ? Bacteria, UA NONE SEEN NONE SEEN  ? Squamous Epithelial / LPF 0-5 0 - 5  ? Mucus PRESENT   ? Ca Oxalate Crys, UA PRESENT   ?Glucose, capillary     Status: None  ? Collection Time: 07/29/21  3:56 PM  ?Result Value Ref Range  ? Glucose-Capillary 89 70 - 99 mg/dL  ?Wet prep, genital     Status: None  ? Collection Time: 07/29/21  4:01 PM  ? Specimen: Vaginal  ?Result Value Ref Range  ? Yeast Wet Prep HPF POC NONE SEEN NONE SEEN  ? Trich, Wet Prep NONE SEEN NONE SEEN  ? Clue Cells Wet Prep HPF POC NONE SEEN NONE SEEN  ? WBC, Wet Prep HPF POC <10 <10  ? Sperm NONE SEEN   ? ? ?Imaging ?No results found. ? ?MAU Course  ?Procedures ?Lab Orders    ?     Wet prep, genital    ?     Urinalysis, Routine w reflex microscopic Urine, Clean Catch    ?     Glucose, capillary    ?     Glucose, capillary    ? ?Meds ordered this encounter  ?Medications  ? polyethylene glycol powder (GLYCOLAX/MIRALAX) 17 GM/SCOOP powder  ?  Sig: Take 17 g by mouth daily as needed.  ?  Dispense:  510 g  ?  Refill:  1  ? ?Imaging Orders  ?No imaging studies ordered today   ? ? ?MDM ?moderate ? ?Assessment and Plan  ?#Abdominal pain in pregnancy, third trimester ?#[redacted] weeks gestation of pregnancy ?No contractions on monitor, cervix closed and long, wet prep unremarkable. Reassured patient no acute findings to expla

## 2021-07-29 NOTE — MAU Note (Signed)
...  Stephanie Mack is a 31 y.o. at 52w2dhere via EMS in MAU reporting: Intermittent abdominal tightness that began around 1420 after bending over to get dressed. She states the tightness is painful and it made her scared. She states it has occurred three times. Denies VB or LOF. +FM. ? ?Last IC two nights ago. ? ?Next OB visit on May 11th. ? ?Pain score:  ?7/10 abdomen ?  ?FHT: 155 initial external ?Lab orders placed from triage: UA ? ?

## 2021-07-30 LAB — GC/CHLAMYDIA PROBE AMP (~~LOC~~) NOT AT ARMC
Chlamydia: NEGATIVE
Comment: NEGATIVE
Comment: NORMAL
Neisseria Gonorrhea: NEGATIVE

## 2021-08-01 ENCOUNTER — Inpatient Hospital Stay (HOSPITAL_COMMUNITY)
Admission: AD | Admit: 2021-08-01 | Discharge: 2021-08-01 | Disposition: A | Discharge status: Home / Self Care | Payer: Medicaid Other | Attending: Obstetrics and Gynecology | Admitting: Obstetrics and Gynecology

## 2021-08-01 ENCOUNTER — Other Ambulatory Visit: Payer: Self-pay

## 2021-08-01 ENCOUNTER — Encounter (HOSPITAL_COMMUNITY): Payer: Self-pay | Admitting: Obstetrics and Gynecology

## 2021-08-01 DIAGNOSIS — O24113 Pre-existing diabetes mellitus, type 2, in pregnancy, third trimester: Secondary | ICD-10-CM | POA: Insufficient documentation

## 2021-08-01 DIAGNOSIS — Z3A28 28 weeks gestation of pregnancy: Secondary | ICD-10-CM | POA: Insufficient documentation

## 2021-08-01 DIAGNOSIS — R739 Hyperglycemia, unspecified: Secondary | ICD-10-CM | POA: Diagnosis not present

## 2021-08-01 DIAGNOSIS — E1165 Type 2 diabetes mellitus with hyperglycemia: Secondary | ICD-10-CM | POA: Diagnosis not present

## 2021-08-01 DIAGNOSIS — E119 Type 2 diabetes mellitus without complications: Secondary | ICD-10-CM

## 2021-08-01 DIAGNOSIS — Z7984 Long term (current) use of oral hypoglycemic drugs: Secondary | ICD-10-CM | POA: Diagnosis not present

## 2021-08-01 DIAGNOSIS — Z833 Family history of diabetes mellitus: Secondary | ICD-10-CM | POA: Diagnosis not present

## 2021-08-01 LAB — COMPREHENSIVE METABOLIC PANEL
ALT: 11 U/L (ref 0–44)
AST: 16 U/L (ref 15–41)
Albumin: 2.6 g/dL — ABNORMAL LOW (ref 3.5–5.0)
Alkaline Phosphatase: 44 U/L (ref 38–126)
Anion gap: 7 (ref 5–15)
BUN: <5 mg/dL — ABNORMAL LOW (ref 6–20)
CO2: 19 mmol/L — ABNORMAL LOW (ref 22–32)
Calcium: 8.3 mg/dL — ABNORMAL LOW (ref 8.9–10.3)
Chloride: 108 mmol/L (ref 98–111)
Creatinine, Ser: 0.48 mg/dL (ref 0.44–1.00)
GFR, Estimated: >60 mL/min (ref >60)
Glucose, Bld: 181 mg/dL — ABNORMAL HIGH (ref 70–99)
Potassium: 2.8 mmol/L — ABNORMAL LOW (ref 3.5–5.1)
Sodium: 134 mmol/L — ABNORMAL LOW (ref 135–145)
Total Bilirubin: 0.3 mg/dL (ref 0.3–1.2)
Total Protein: 6 g/dL — ABNORMAL LOW (ref 6.5–8.1)

## 2021-08-01 LAB — URINALYSIS, ROUTINE W REFLEX MICROSCOPIC
Bilirubin Urine: NEGATIVE
Glucose, UA: >=500 mg/dL — AB
Hgb urine dipstick: NEGATIVE
Ketones, ur: NEGATIVE mg/dL
Nitrite: NEGATIVE
Protein, ur: NEGATIVE mg/dL
Specific Gravity, Urine: 1.002 — ABNORMAL LOW (ref 1.005–1.030)
pH: 7 (ref 5.0–8.0)

## 2021-08-01 LAB — CBC WITH DIFFERENTIAL/PLATELET
Abs Immature Granulocytes: 0.03 10*3/uL (ref 0.00–0.07)
Basophils Absolute: 0 10*3/uL (ref 0.0–0.1)
Basophils Relative: 0 %
Eosinophils Absolute: 0 10*3/uL (ref 0.0–0.5)
Eosinophils Relative: 1 %
HCT: 31 % — ABNORMAL LOW (ref 36.0–46.0)
Hemoglobin: 9.9 g/dL — ABNORMAL LOW (ref 12.0–15.0)
Immature Granulocytes: 0 %
Lymphocytes Relative: 27 %
Lymphs Abs: 2 10*3/uL (ref 0.7–4.0)
MCH: 26.3 pg (ref 26.0–34.0)
MCHC: 31.9 g/dL (ref 30.0–36.0)
MCV: 82.4 fL (ref 80.0–100.0)
Monocytes Absolute: 0.6 10*3/uL (ref 0.1–1.0)
Monocytes Relative: 7 %
Neutro Abs: 4.9 10*3/uL (ref 1.7–7.7)
Neutrophils Relative %: 65 %
Platelets: 197 10*3/uL (ref 150–400)
RBC: 3.76 MIL/uL — ABNORMAL LOW (ref 3.87–5.11)
RDW: 13.6 % (ref 11.5–15.5)
WBC: 7.5 10*3/uL (ref 4.0–10.5)
nRBC: 0 % (ref 0.0–0.2)

## 2021-08-01 LAB — URIC ACID: Uric Acid, Serum: 2.8 mg/dL (ref 2.5–7.1)

## 2021-08-01 LAB — LACTATE DEHYDROGENASE: LDH: 102 U/L (ref 98–192)

## 2021-08-01 LAB — GLUCOSE, CAPILLARY: Glucose-Capillary: 176 mg/dL — ABNORMAL HIGH (ref 70–99)

## 2021-08-01 MED ORDER — LACTATED RINGERS IV BOLUS
1000.0000 mL | Freq: Once | INTRAVENOUS | Status: AC
  Administered 2021-08-01: 1000 mL via INTRAVENOUS

## 2021-08-01 NOTE — MAU Note (Signed)
Call from front desk, "pt non-responsive".  CNM and RN x2- to lobby, pt placed in wc, coming around, taken immediately to rm 127.  OB RR called ?

## 2021-08-01 NOTE — Discharge Instructions (Signed)

## 2021-08-01 NOTE — MAU Note (Signed)
Patient arrived to MAU complaining of waking up ar 1400 and not feeling well.. She checked her blood sugar and it was 235. Patient did not eat today. ?MAU staff called to waiting room by registration stating that patient was unresponsive.  Patient appeared to be lethargic.  ?Patient was able to respond and ambulate to chair and stretcher. Patient is responding verbally to questions.  ?+ fm reported ?Denies vaginal bleeding and or leakage of fluid ? ?

## 2021-08-01 NOTE — MAU Provider Note (Signed)
?History  ?  ? ?CSN: [id number] ? ?Arrival date and time: 08/01/21 1514 ? ? Event Date/Time  ? First Provider Initiated Contact with Patient 08/01/21 1525   ?  ? ?Chief Complaint  ?Patient presents with  ? Hyperglycemia  ? ?HPI ?Stephanie Mack is a 31 y.o. [phone] at 18w5dwho presents feeling generally unwell with elevated blood sugars. She reports she woke up late today and was getting ready for breakfast when she started feeling "bad." She reports she checked her CBG and it was 235. She reports she hasn't eaten anything today. She reports she last took her metformin last and this morning as prescribed. She denies any abdominal pain, vaginal bleeding or discharge. She reports normal fetal movement.  ? ?OB History   ? ? Gravida  ?4  ? Para  ?2  ? Term  ?0  ? Preterm  ?2  ? AB  ?1  ? Living  ?1  ?  ? ? SAB  ?1  ? IAB  ?0  ? Ectopic  ?0  ? Multiple  ?0  ? Live Births  ?2  ?   ?  ?  ? ? ?Past Medical History:  ?Diagnosis Date  ? ADHD (attention deficit hyperactivity disorder)   ? Anemia   ? Anxiety   ? Bipolar 1 disorder ([facility]   ? Chlamydia 05/31/2010  ? Chronic constipation   ? Depression   ? depression  ? GERD (gastroesophageal reflux disease)   ? with pregnancy  ? Gestational diabetes   ? current pregnancy  ? Gonorrhea contact, treated   ? Headache   ? Hypertension   ? Infection   ? UTI  ? Pregnancy induced hypertension   ? previous pregnancy  ? Pseudocyesis 2013  ? Seen in MAU for percieved FM, abd distension. Normal exam.   ? Sickle cell trait ([facility]   ? Type 2 diabetes mellitus ([place] 02/13/2017  ? ? ?Past Surgical History:  ?Procedure Laterality Date  ? CESAREAN SECTION N/A 09/01/2012  ? Procedure:  Primary cesarean section with delivery of baby girl at 142  Apgars 1/1.  ;  Surgeon: BFrederico Hamman MD;  [facility];  Service: Obstetrics;  Laterality: N/A;  ? CESAREAN SECTION N/A 01/07/2015  ? Procedure: REPEAT CESAREAN SECTION;  Surgeon: CShelly Bombard MD;  [facility];  Service: Obstetrics;   Laterality: N/A;  ? NASAL SEPTUM SURGERY    ? WISDOM TOOTH EXTRACTION    ? ? ?Family History  ?Problem Relation Age of Onset  ? Kidney disease Mother   ? Vision loss Father   ? Hypertension Father   ? Cancer Sister   ? Asthma Brother   ? Diabetes Maternal Grandfather   ? Heart disease Neg Hx   ? ? ?Social History  ? ?Tobacco Use  ? Smoking status: Never  ? Smokeless tobacco: Never  ?Vaping Use  ? Vaping Use: Never used  ?Substance Use Topics  ? Alcohol use: No  ?  Alcohol/week: 0.0 standard drinks  ? Drug use: No  ? ? ?Allergies: No Known Allergies ? ?Medications Prior to Admission  ?Medication Sig Dispense Refill Last Dose  ? ACCU-CHEK FASTCLIX LANCETS MISC TEST UPTO 4 TIMES DAILY 102 each 3   ? aspirin EC 81 MG tablet Take 81 mg by mouth daily. Swallow whole.     ? blood glucose meter kit and supplies KIT Dispense based on patient and insurance preference. Use up to four times daily as directed. (FOR ICD-9  250.00, 250.01). 1 each 0   ? cholecalciferol (VITAMIN D3) 25 MCG (1000 UNIT) tablet Take 1,000 Units by mouth daily.     ? cyclobenzaprine (FLEXERIL) 10 MG tablet Take 1 tablet (10 mg total) by mouth 2 (two) times daily as needed for muscle spasms. 20 tablet 0   ? glucose blood test strip TEST UPTO 4 TIMES DAILY 100 each 3   ? metFORMIN (GLUCOPHAGE-XR) 500 MG 24 hr tablet Take 2 tablets po daily after breakfast and after supper. 120 tablet 11   ? NIFEdipine (PROCARDIA) 20 MG capsule Take 20 mg by mouth once.     ? ondansetron (ZOFRAN-ODT) 4 MG disintegrating tablet Take 1 tablet (4 mg total) by mouth every 6 (six) hours as needed for nausea. 20 tablet 0   ? polyethylene glycol (MIRALAX) 17 g packet Take 17 g by mouth daily. 100 each 2   ? polyethylene glycol powder (GLYCOLAX/MIRALAX) 17 GM/SCOOP powder Take 17 g by mouth daily as needed. 510 g 1   ? potassium chloride (KLOR-CON M) 10 MEQ tablet Take 1 tablet (10 mEq total) by mouth daily for 5 doses. 5 tablet 0   ? Prenat-Fe Poly-Methfol-FA-DHA (VITAFOL ULTRA)  29-0.6-0.4-200 MG CAPS Take 1 capsule by mouth daily before breakfast. 90 capsule 4   ? ? ?Review of Systems  ?Constitutional:  Positive for diaphoresis. Negative for fatigue and fever.  ?HENT: Negative.    ?Respiratory: Negative.  Negative for shortness of breath.   ?Cardiovascular: Negative.  Negative for chest pain.  ?Gastrointestinal: Negative.  Negative for abdominal pain, constipation, diarrhea, nausea and vomiting.  ?Genitourinary: Negative.  Negative for dysuria, vaginal bleeding and vaginal discharge.  ?Neurological: Negative.  Negative for dizziness and headaches.  ?Physical Exam  ? ?Blood pressure (!) 121/59, pulse 79, temperature 98 ?F (36.7 ?C), temperature source Oral, resp. rate 14, last menstrual period 01/21/2021, SpO2 99 %. ? ?Physical Exam ?Vitals and nursing note reviewed.  ?Constitutional:   ?   General: She is not in acute distress. ?   Appearance: She is well-developed.  ?HENT:  ?   Head: Normocephalic.  ?Eyes:  ?   Pupils: Pupils are equal, round, and reactive to light.  ?Cardiovascular:  ?   Rate and Rhythm: Normal rate and regular rhythm.  ?   Heart sounds: Normal heart sounds.  ?Pulmonary:  ?   Effort: Pulmonary effort is normal. No respiratory distress.  ?   Breath sounds: Normal breath sounds.  ?Abdominal:  ?   General: Bowel sounds are normal. There is no distension.  ?   Palpations: Abdomen is soft.  ?   Tenderness: There is no abdominal tenderness.  ?Skin: ?   General: Skin is warm and dry.  ?Neurological:  ?   Mental Status: She is alert and oriented to person, place, and time.  ?Psychiatric:     ?   Mood and Affect: Mood normal.     ?   Behavior: Behavior normal.     ?   Thought Content: Thought content normal.     ?   Judgment: Judgment normal.  ? ?Fetal Tracing: ? ?Baseline: 135 ?Variability: moderate ?Accels: 15x15 ?Decels: none ? ?Toco: none ? ? ?MAU Course  ?Procedures ?Results for orders placed or performed during the hospital encounter of 08/01/21 (from the past 24 hour(s))   ?Glucose, capillary     Status: Abnormal  ? Collection Time: 08/01/21  3:39 PM  ?Result Value Ref Range  ? Glucose-Capillary 176 (H) 70 - 99  mg/dL  ?CBC with Differential/Platelet     Status: Abnormal  ? Collection Time: 08/01/21  3:43 PM  ?Result Value Ref Range  ? WBC 7.5 4.0 - 10.5 K/uL  ? RBC 3.76 (L) 3.87 - 5.11 MIL/uL  ? Hemoglobin 9.9 (L) 12.0 - 15.0 g/dL  ? HCT 31.0 (L) 36.0 - 46.0 %  ? MCV 82.4 80.0 - 100.0 fL  ? MCH 26.3 26.0 - 34.0 pg  ? MCHC 31.9 30.0 - 36.0 g/dL  ? RDW 13.6 11.5 - 15.5 %  ? Platelets 197 150 - 400 K/uL  ? nRBC 0.0 0.0 - 0.2 %  ? Neutrophils Relative % 65 %  ? Neutro Abs 4.9 1.7 - 7.7 K/uL  ? Lymphocytes Relative 27 %  ? Lymphs Abs 2.0 0.7 - 4.0 K/uL  ? Monocytes Relative 7 %  ? Monocytes Absolute 0.6 0.1 - 1.0 K/uL  ? Eosinophils Relative 1 %  ? Eosinophils Absolute 0.0 0.0 - 0.5 K/uL  ? Basophils Relative 0 %  ? Basophils Absolute 0.0 0.0 - 0.1 K/uL  ? Immature Granulocytes 0 %  ? Abs Immature Granulocytes 0.03 0.00 - 0.07 K/uL  ?Comprehensive metabolic panel     Status: Abnormal  ? Collection Time: 08/01/21  3:43 PM  ?Result Value Ref Range  ? Sodium 134 (L) 135 - 145 mmol/L  ? Potassium 2.8 (L) 3.5 - 5.1 mmol/L  ? Chloride 108 98 - 111 mmol/L  ? CO2 19 (L) 22 - 32 mmol/L  ? Glucose, Bld 181 (H) 70 - 99 mg/dL  ? BUN <5 (L) 6 - 20 mg/dL  ? Creatinine, Ser 0.48 0.44 - 1.00 mg/dL  ? Calcium 8.3 (L) 8.9 - 10.3 mg/dL  ? Total Protein 6.0 (L) 6.5 - 8.1 g/dL  ? Albumin 2.6 (L) 3.5 - 5.0 g/dL  ? AST 16 15 - 41 U/L  ? ALT 11 0 - 44 U/L  ? Alkaline Phosphatase 44 38 - 126 U/L  ? Total Bilirubin 0.3 0.3 - 1.2 mg/dL  ? GFR, Estimated >60 >60 mL/min  ? Anion gap 7 5 - 15  ?Uric acid     Status: None  ? Collection Time: 08/01/21  3:43 PM  ?Result Value Ref Range  ? Uric Acid, Serum 2.8 2.5 - 7.1 mg/dL  ?Lactate dehydrogenase     Status: None  ? Collection Time: 08/01/21  3:43 PM  ?Result Value Ref Range  ? LDH 102 98 - 192 U/L  ?Type and screen [place]     Status: None  ?  Collection Time: 08/01/21  3:43 PM  ?Result Value Ref Range  ? ABO/RH(D) B NEG   ? Antibody Screen POS   ? Sample Expiration 08/04/2021,2359   ? Antibody Identification    ?  PASSIVELY ACQUIRED ANTI-D ?Perform

## 2021-08-02 LAB — TYPE AND SCREEN
ABO/RH(D): B NEG
Antibody Screen: POSITIVE

## 2021-08-05 ENCOUNTER — Other Ambulatory Visit: Payer: Medicaid Other

## 2021-08-11 ENCOUNTER — Inpatient Hospital Stay (HOSPITAL_COMMUNITY)
Admission: AD | Admit: 2021-08-11 | Discharge: 2021-08-11 | Disposition: A | Discharge status: Home / Self Care | Payer: Medicaid Other | Attending: Obstetrics and Gynecology | Admitting: Obstetrics and Gynecology

## 2021-08-11 ENCOUNTER — Encounter (HOSPITAL_COMMUNITY): Payer: Self-pay | Admitting: Obstetrics and Gynecology

## 2021-08-11 DIAGNOSIS — Z7984 Long term (current) use of oral hypoglycemic drugs: Secondary | ICD-10-CM | POA: Insufficient documentation

## 2021-08-11 DIAGNOSIS — O99810 Abnormal glucose complicating pregnancy: Secondary | ICD-10-CM

## 2021-08-11 DIAGNOSIS — Z794 Long term (current) use of insulin: Secondary | ICD-10-CM | POA: Diagnosis not present

## 2021-08-11 DIAGNOSIS — Z3689 Encounter for other specified antenatal screening: Secondary | ICD-10-CM

## 2021-08-11 DIAGNOSIS — O26893 Other specified pregnancy related conditions, third trimester: Secondary | ICD-10-CM | POA: Insufficient documentation

## 2021-08-11 DIAGNOSIS — R109 Unspecified abdominal pain: Secondary | ICD-10-CM | POA: Insufficient documentation

## 2021-08-11 DIAGNOSIS — Z3A30 30 weeks gestation of pregnancy: Secondary | ICD-10-CM

## 2021-08-11 DIAGNOSIS — E139 Other specified diabetes mellitus without complications: Secondary | ICD-10-CM

## 2021-08-11 DIAGNOSIS — R739 Hyperglycemia, unspecified: Secondary | ICD-10-CM | POA: Insufficient documentation

## 2021-08-11 LAB — URINALYSIS, ROUTINE W REFLEX MICROSCOPIC
Bacteria, UA: NONE SEEN
Bilirubin Urine: NEGATIVE
Glucose, UA: >=500 mg/dL — AB
Hgb urine dipstick: NEGATIVE
Ketones, ur: NEGATIVE mg/dL
Leukocytes,Ua: NEGATIVE
Nitrite: NEGATIVE
Protein, ur: NEGATIVE mg/dL
Specific Gravity, Urine: 1.01 (ref 1.005–1.030)
pH: 6 (ref 5.0–8.0)

## 2021-08-11 LAB — GLUCOSE, CAPILLARY: Glucose-Capillary: 169 mg/dL — ABNORMAL HIGH (ref 70–99)

## 2021-08-11 MED ORDER — METFORMIN HCL ER (MOD) 1000 MG PO TB24: ORAL_TABLET | ORAL | 2 refills | Status: DC

## 2021-08-11 MED ORDER — ACETAMINOPHEN 500 MG PO TABS
1000.0000 mg | ORAL_TABLET | Freq: Once | ORAL | Status: AC
Start: 2021-08-11 — End: 2021-08-11
  Administered 2021-08-11: 1000 mg via ORAL
  Filled 2021-08-11: qty 2

## 2021-08-11 NOTE — MAU Note (Signed)
Stephanie Mack is a 31 y.o. at 19w1dhere in MAU reporting: "my sugar too high", 370 before she came, 372 in lobby. Stopped taking insulin, "made her go into 3 diabetic comas (April)". Feels weak, body hurts.  Denies bleeding or LOF, reports +FM ? ?Onset of complaint: earlier this morning ?Pain score: 8 ?Vitals:  ? 08/11/21 1326  ?BP: 132/79  ?Pulse: (!) 110  ?Resp: 17  ?Temp: 98.7 ?F (37.1 ?C)  ?SpO2: 99%  ?   ?FHT:143 ?Lab orders placed from triage:  urine, cbg ?

## 2021-08-11 NOTE — MAU Provider Note (Signed)
?History  ?  ? ?CSN: [id number] ? ?Arrival date and time: 08/11/21 1315 ? ? Event Date/Time  ? First Provider Initiated Contact with Patient 08/11/21 1411   ?  ? ?No chief complaint on file. ? ?Lalena Salas is a 31 y.o. [phone] at [redacted]w[redacted]d who receives care at [facility].  She presents today for Hyperglycemia in setting of known T2DM on insulin.  Patient reports today CBGs were "almost 400," despite taking metformin $RemoveBeforeDE'500mg'UUGDukdcHkWpoPt$ . She reports she is suppose to be on insulin, but discontinued it in April because it "almost killed me 3x." She reports she has informed her primary office of her discontinuation of insulin and they "told me not to take it." Patient also reports some abdominal cramping that has been intermittent throughout the day.  She rates the pain a 6/10 and denies vaginal bleeding, itching, or odor.  She also denies aggravating or relieving factors for her pain.  Patient endorses fetal movement  ? ? ?OB History   ? ? Gravida  ?4  ? Para  ?2  ? Term  ?0  ? Preterm  ?2  ? AB  ?1  ? Living  ?1  ?  ? ? SAB  ?1  ? IAB  ?0  ? Ectopic  ?0  ? Multiple  ?0  ? Live Births  ?2  ?   ?  ?  ? ? ?Past Medical History:  ?Diagnosis Date  ? ADHD (attention deficit hyperactivity disorder)   ? Anemia   ? Anxiety   ? Bipolar 1 disorder ([place])   ? Chlamydia 05/31/2010  ? Chronic constipation   ? Depression   ? depression  ? GERD (gastroesophageal reflux disease)   ? with pregnancy  ? Gestational diabetes   ? current pregnancy  ? Gonorrhea contact, treated   ? Headache   ? Hypertension   ? Infection   ? UTI  ? Pregnancy induced hypertension   ? previous pregnancy  ? Pseudocyesis 2013  ? Seen in MAU for percieved FM, abd distension. Normal exam.   ? Sickle cell trait ([place])   ? Type 2 diabetes mellitus ([place]) 02/13/2017  ? ? ?Past Surgical History:  ?Procedure Laterality Date  ? CESAREAN SECTION N/A 09/01/2012  ? Procedure:  Primary cesarean section with delivery of baby girl at 34.  Apgars 1/1.  ;  Surgeon: Frederico Hamman, MD;  Location:  [facility];  Service: Obstetrics;  Laterality: N/A;  ? CESAREAN SECTION N/A 01/07/2015  ? Procedure: REPEAT CESAREAN SECTION;  Surgeon: Shelly Bombard, MD;  Location: [facility];  Service: Obstetrics;  Laterality: N/A;  ? NASAL SEPTUM SURGERY    ? WISDOM TOOTH EXTRACTION    ? ? ?Family History  ?Problem Relation Age of Onset  ? Kidney disease Mother   ? Vision loss Father   ? Hypertension Father   ? Cancer Sister   ? Asthma Brother   ? Diabetes Maternal Grandfather   ? Heart disease Neg Hx   ? ? ?Social History  ? ?Tobacco Use  ? Smoking status: Never  ? Smokeless tobacco: Never  ?Vaping Use  ? Vaping Use: Never used  ?Substance Use Topics  ? Alcohol use: No  ?  Alcohol/week: 0.0 standard drinks  ? Drug use: No  ? ? ?Allergies: No Known Allergies ? ?Medications Prior to Admission  ?Medication Sig Dispense Refill Last Dose  ? aspirin EC 81 MG tablet Take 81 mg by mouth daily. Swallow whole.   08/11/2021 at 1130  ?  cholecalciferol (VITAMIN D3) 25 MCG (1000 UNIT) tablet Take 1,000 Units by mouth daily.   08/11/2021 at 1130  ? cyclobenzaprine (FLEXERIL) 10 MG tablet Take 1 tablet (10 mg total) by mouth 2 (two) times daily as needed for muscle spasms. 20 tablet 0 08/11/2021 at 1130  ? metFORMIN (GLUCOPHAGE-XR) 500 MG 24 hr tablet Take 2 tablets po daily after breakfast and after supper. 120 tablet 11 08/11/2021 at 1130  ? NIFEdipine (PROCARDIA) 20 MG capsule Take 20 mg by mouth once.   08/11/2021 at 1130  ? Prenat-Fe Poly-Methfol-FA-DHA (VITAFOL ULTRA) 29-0.6-0.4-200 MG CAPS Take 1 capsule by mouth daily before breakfast. 90 capsule 4 08/11/2021 at 1130  ? ACCU-CHEK FASTCLIX LANCETS MISC TEST UPTO 4 TIMES DAILY 102 each 3   ? blood glucose meter kit and supplies KIT Dispense based on patient and insurance preference. Use up to four times daily as directed. (FOR ICD-9 250.00, 250.01). 1 each 0   ? glucose blood test strip TEST UPTO 4 TIMES DAILY 100 each 3   ? ondansetron (ZOFRAN-ODT) 4 MG disintegrating tablet Take 1 tablet (4 mg  total) by mouth every 6 (six) hours as needed for nausea. 20 tablet 0   ? polyethylene glycol (MIRALAX) 17 g packet Take 17 g by mouth daily. 100 each 2   ? polyethylene glycol powder (GLYCOLAX/MIRALAX) 17 GM/SCOOP powder Take 17 g by mouth daily as needed. 510 g 1   ? potassium chloride (KLOR-CON M) 10 MEQ tablet Take 1 tablet (10 mEq total) by mouth daily for 5 doses. 5 tablet 0   ? ? ?Review of Systems  ?Eyes:  Negative for visual disturbance.  ?Gastrointestinal:  Positive for abdominal pain (6/10). Negative for constipation, diarrhea, nausea and vomiting.  ?Genitourinary:  Negative for difficulty urinating, dysuria, vaginal bleeding and vaginal discharge.  ?Musculoskeletal:  Negative for back pain.  ?Neurological:  Negative for dizziness, light-headedness and headaches.  ?Physical Exam  ? ?Blood pressure 129/77, pulse (!) 116, temperature 98.7 ?F (37.1 ?C), temperature source Oral, resp. rate 18, height $RemoveBe'5\' 2"'jecYqEtdk$  (1.575 m), weight 75.2 kg, last menstrual period 01/21/2021, SpO2 97 %. ? ?Physical Exam ?Vitals reviewed.  ?Constitutional:   ?   Appearance: Normal appearance.  ?HENT:  ?   Head: Normocephalic and atraumatic.  ?Eyes:  ?   Conjunctiva/sclera: Conjunctivae normal.  ?Cardiovascular:  ?   Rate and Rhythm: Normal rate.  ?Pulmonary:  ?   Effort: Pulmonary effort is normal. No respiratory distress.  ?Musculoskeletal:  ?   Cervical back: Normal range of motion.  ?Neurological:  ?   Mental Status: She is alert and oriented to person, place, and time.  ?Psychiatric:     ?   Mood and Affect: Mood normal.     ?   Behavior: Behavior normal.  ? ? ?Fetal Assessment ?135 bpm, Mod Var, -Decels, +10x10 Accels ?Toco: Occ. Ctx ? ?MAU Course  ? ?Results for orders placed or performed during the hospital encounter of 08/11/21 (from the past 24 hour(s))  ?Urinalysis, Routine w reflex microscopic Urine, Clean Catch     Status: Abnormal  ? Collection Time: 08/11/21  2:24 PM  ?Result Value Ref Range  ? Color, Urine YELLOW YELLOW   ? APPearance HAZY (A) CLEAR  ? Specific Gravity, Urine 1.010 1.005 - 1.030  ? pH 6.0 5.0 - 8.0  ? Glucose, UA >=500 (A) NEGATIVE mg/dL  ? Hgb urine dipstick NEGATIVE NEGATIVE  ? Bilirubin Urine NEGATIVE NEGATIVE  ? Ketones, ur NEGATIVE NEGATIVE mg/dL  ?  Protein, ur NEGATIVE NEGATIVE mg/dL  ? Nitrite NEGATIVE NEGATIVE  ? Leukocytes,Ua NEGATIVE NEGATIVE  ? RBC / HPF 0-5 0 - 5 RBC/hpf  ? WBC, UA 0-5 0 - 5 WBC/hpf  ? Bacteria, UA NONE SEEN NONE SEEN  ? Squamous Epithelial / LPF 6-10 0 - 5  ? Mucus PRESENT   ?Glucose, capillary     Status: Abnormal  ? Collection Time: 08/11/21  2:30 PM  ?Result Value Ref Range  ? Glucose-Capillary 169 (H) 70 - 99 mg/dL  ? ?No results found. ? ?MDM ?PE ?Labs: CBG, UA ?EFM ?Pain Medication ?Prescription Modification ?Assessment and Plan  ?31 year old [id number]  ?SIUP at 30.1 weeks ?Cat I FT ?Hyperglycemia ?Abdominal Cramping ? ? ?-Exam performed. ?-Informed that CBG will be obtained and will consult with MD. ?-Patient offered and accepts pain medication for abdominal cramping. ?-Tylenol 1000g ordered. ?-NST reactive and reassuring for GA. ?-Will await CBG results and treat as appropriate.  ? ?Maryann Conners MSN, CNM ?08/11/2021, 2:11 PM  ? ?Reassessment (3:00 PM) ?-CBG returns at 169 ?-Dr. Elray Mcgregor consulted and advised okay to defer insulin dosing at current. Consider increasing metformin to 1028m BID. ?-Patient informed and agreeable with change. ?-Provider to modify medication and noted that currently prescribed for 5038mTake 2 tablets twice daily.  Patient informed of improper dosing. ?-Medication modified to 100064mablet for BID dosing. ?-Patient instructed to follow up, as scheduled, with primary office tomorrow and encouraged to discussed proper monitoring and management of diabetes.  ?-Patient reports pain improved with tylenol dosing.  ?-Encouraged to call primary office or return to MAU if symptoms worsen or with the onset of new symptoms. ?-Discharged to home in stable  condition. ? ?JesMaryann ConnersN, CNM ?[employer] managerenter for [employer]? ? ?

## 2021-08-13 ENCOUNTER — Ambulatory Visit: Payer: Medicaid Other | Attending: Maternal & Fetal Medicine

## 2021-08-13 ENCOUNTER — Encounter: Payer: Self-pay | Admitting: *Deleted

## 2021-08-13 ENCOUNTER — Ambulatory Visit: Payer: Medicaid Other | Admitting: *Deleted

## 2021-08-13 VITALS — BP 130/72 | HR 94

## 2021-08-13 DIAGNOSIS — O24113 Pre-existing diabetes mellitus, type 2, in pregnancy, third trimester: Secondary | ICD-10-CM

## 2021-08-13 DIAGNOSIS — O09293 Supervision of pregnancy with other poor reproductive or obstetric history, third trimester: Secondary | ICD-10-CM | POA: Diagnosis not present

## 2021-08-13 DIAGNOSIS — F319 Bipolar disorder, unspecified: Secondary | ICD-10-CM

## 2021-08-13 DIAGNOSIS — O24912 Unspecified diabetes mellitus in pregnancy, second trimester: Secondary | ICD-10-CM | POA: Insufficient documentation

## 2021-08-13 DIAGNOSIS — O09292 Supervision of pregnancy with other poor reproductive or obstetric history, second trimester: Secondary | ICD-10-CM | POA: Diagnosis present

## 2021-08-13 DIAGNOSIS — O34219 Maternal care for unspecified type scar from previous cesarean delivery: Secondary | ICD-10-CM | POA: Diagnosis not present

## 2021-08-13 DIAGNOSIS — O10013 Pre-existing essential hypertension complicating pregnancy, third trimester: Secondary | ICD-10-CM

## 2021-08-13 DIAGNOSIS — B009 Herpesviral infection, unspecified: Secondary | ICD-10-CM

## 2021-08-13 DIAGNOSIS — O09213 Supervision of pregnancy with history of pre-term labor, third trimester: Secondary | ICD-10-CM

## 2021-08-13 DIAGNOSIS — Z8759 Personal history of other complications of pregnancy, childbirth and the puerperium: Secondary | ICD-10-CM | POA: Diagnosis present

## 2021-08-13 DIAGNOSIS — Z3A30 30 weeks gestation of pregnancy: Secondary | ICD-10-CM

## 2021-08-13 DIAGNOSIS — O98513 Other viral diseases complicating pregnancy, third trimester: Secondary | ICD-10-CM

## 2021-08-13 DIAGNOSIS — O360130 Maternal care for anti-D [Rh] antibodies, third trimester, not applicable or unspecified: Secondary | ICD-10-CM

## 2021-08-13 DIAGNOSIS — O99343 Other mental disorders complicating pregnancy, third trimester: Secondary | ICD-10-CM

## 2021-08-16 ENCOUNTER — Encounter (HOSPITAL_COMMUNITY): Payer: Self-pay | Admitting: Obstetrics and Gynecology

## 2021-08-16 ENCOUNTER — Inpatient Hospital Stay (HOSPITAL_COMMUNITY)
Admission: AD | Admit: 2021-08-16 | Discharge: 2021-08-16 | Disposition: A | Discharge status: Home / Self Care | Payer: Medicaid Other | Attending: Obstetrics and Gynecology | Admitting: Obstetrics and Gynecology

## 2021-08-16 ENCOUNTER — Other Ambulatory Visit: Payer: Self-pay

## 2021-08-16 DIAGNOSIS — Z3A30 30 weeks gestation of pregnancy: Secondary | ICD-10-CM | POA: Diagnosis not present

## 2021-08-16 DIAGNOSIS — Z3689 Encounter for other specified antenatal screening: Secondary | ICD-10-CM

## 2021-08-16 DIAGNOSIS — O479 False labor, unspecified: Secondary | ICD-10-CM

## 2021-08-16 DIAGNOSIS — O4703 False labor before 37 completed weeks of gestation, third trimester: Secondary | ICD-10-CM | POA: Insufficient documentation

## 2021-08-16 LAB — URINALYSIS, ROUTINE W REFLEX MICROSCOPIC
Bilirubin Urine: NEGATIVE
Glucose, UA: NEGATIVE mg/dL
Hgb urine dipstick: NEGATIVE
Ketones, ur: NEGATIVE mg/dL
Nitrite: NEGATIVE
Protein, ur: NEGATIVE mg/dL
Specific Gravity, Urine: 1.01 (ref 1.005–1.030)
pH: 7 (ref 5.0–8.0)

## 2021-08-16 NOTE — MAU Note (Signed)
Pt says feels UC's - started  at 630pm- she called office - told to come here ?PNCJocelyn Lamer , CNM ?Last sex- last mth ? ? ?

## 2021-08-16 NOTE — MAU Provider Note (Signed)
Event Date/Time  ? First Provider Initiated Contact with Patient 08/16/21 2021   ?  ?S: [name] is a 31 y.o. [phone] at [redacted]w[redacted]d who presents to MAU today reporting "belly tightenings" (1-2x) since 6:30pm. Denies any pain or cramping, just tightness.  She denies vaginal bleeding. She denies LOF. She reports normal fetal movement.   ? ?O: BP 119/75 (BP Location: Right Arm)   Pulse 91   Temp 98.1 ?F (36.7 ?C) (Oral)   Resp 20   Ht '5\' 2"'$  (1.575 m)   Wt 166 lb 14.4 oz (75.7 kg)   LMP 01/21/2021   BMI 30.53 kg/m?  ?GENERAL: Well-developed, well-nourished female in no acute distress.  ?HEAD: Normocephalic, atraumatic.  ?CHEST: Normal effort of breathing, regular heart rate ?ABDOMEN: Soft, nontender, gravid ? ?Cervical exam:  ?Dilation: Closed ?Effacement (%): Thick ?Cervical Position: Posterior ?Station: Ballotable ?Presentation: Undeterminable ?Exam by:: JKandee Keen CNM ? ?Fetal Monitoring: reactive ?Baseline: 135 ?Variability: moderate ?Accelerations: 10x10 (appropriate for gestational age) ?Decelerations: none ?Contractions: very occasional and mild ? ?MDM/MAU Course: ?Reassurance given that what she felt are Braxton-Hicks and discussed warning signs of preterm labor. Baby very active with easily palpable movement. ? ?A: ?SIUP at 342w6d?Braxton-Hicks contractions ?NST reactive ? ?P: ?Discharge home in stable condition with preterm labor precautions ?[facility] ? ?WaGabriel CarinaCNM ?08/16/2021 8:43 PM ? ?

## 2021-08-19 ENCOUNTER — Other Ambulatory Visit: Payer: Self-pay | Admitting: Obstetrics & Gynecology

## 2021-08-25 ENCOUNTER — Other Ambulatory Visit: Payer: Self-pay | Admitting: *Deleted

## 2021-08-25 ENCOUNTER — Ambulatory Visit: Payer: Medicaid Other | Attending: Maternal & Fetal Medicine

## 2021-08-25 ENCOUNTER — Ambulatory Visit: Payer: Medicaid Other | Admitting: *Deleted

## 2021-08-25 ENCOUNTER — Encounter: Payer: Self-pay | Admitting: *Deleted

## 2021-08-25 VITALS — BP 125/73 | HR 87

## 2021-08-25 DIAGNOSIS — O403XX0 Polyhydramnios, third trimester, not applicable or unspecified: Secondary | ICD-10-CM

## 2021-08-25 DIAGNOSIS — Z7984 Long term (current) use of oral hypoglycemic drugs: Secondary | ICD-10-CM

## 2021-08-25 DIAGNOSIS — O10913 Unspecified pre-existing hypertension complicating pregnancy, third trimester: Secondary | ICD-10-CM

## 2021-08-25 DIAGNOSIS — O10013 Pre-existing essential hypertension complicating pregnancy, third trimester: Secondary | ICD-10-CM

## 2021-08-25 DIAGNOSIS — O360130 Maternal care for anti-D [Rh] antibodies, third trimester, not applicable or unspecified: Secondary | ICD-10-CM

## 2021-08-25 DIAGNOSIS — O24912 Unspecified diabetes mellitus in pregnancy, second trimester: Secondary | ICD-10-CM | POA: Diagnosis present

## 2021-08-25 DIAGNOSIS — O24113 Pre-existing diabetes mellitus, type 2, in pregnancy, third trimester: Secondary | ICD-10-CM

## 2021-08-25 DIAGNOSIS — O98519 Other viral diseases complicating pregnancy, unspecified trimester: Secondary | ICD-10-CM

## 2021-08-25 DIAGNOSIS — Z3A32 32 weeks gestation of pregnancy: Secondary | ICD-10-CM

## 2021-08-25 DIAGNOSIS — O34219 Maternal care for unspecified type scar from previous cesarean delivery: Secondary | ICD-10-CM | POA: Insufficient documentation

## 2021-08-25 DIAGNOSIS — F319 Bipolar disorder, unspecified: Secondary | ICD-10-CM

## 2021-08-25 DIAGNOSIS — O09213 Supervision of pregnancy with history of pre-term labor, third trimester: Secondary | ICD-10-CM

## 2021-08-25 DIAGNOSIS — B009 Herpesviral infection, unspecified: Secondary | ICD-10-CM

## 2021-08-25 DIAGNOSIS — O09293 Supervision of pregnancy with other poor reproductive or obstetric history, third trimester: Secondary | ICD-10-CM

## 2021-08-25 DIAGNOSIS — Z8759 Personal history of other complications of pregnancy, childbirth and the puerperium: Secondary | ICD-10-CM | POA: Diagnosis present

## 2021-08-25 DIAGNOSIS — O09292 Supervision of pregnancy with other poor reproductive or obstetric history, second trimester: Secondary | ICD-10-CM | POA: Insufficient documentation

## 2021-08-25 DIAGNOSIS — O99340 Other mental disorders complicating pregnancy, unspecified trimester: Secondary | ICD-10-CM

## 2021-08-29 ENCOUNTER — Encounter (HOSPITAL_COMMUNITY): Payer: Self-pay | Admitting: Obstetrics and Gynecology

## 2021-08-29 ENCOUNTER — Inpatient Hospital Stay (HOSPITAL_COMMUNITY)
Admission: AD | Admit: 2021-08-29 | Discharge: 2021-08-29 | Disposition: A | Discharge status: Home / Self Care | Payer: Medicaid Other | Attending: Obstetrics and Gynecology | Admitting: Obstetrics and Gynecology

## 2021-08-29 DIAGNOSIS — R12 Heartburn: Secondary | ICD-10-CM | POA: Diagnosis not present

## 2021-08-29 DIAGNOSIS — R110 Nausea: Secondary | ICD-10-CM | POA: Diagnosis not present

## 2021-08-29 DIAGNOSIS — Z3A32 32 weeks gestation of pregnancy: Secondary | ICD-10-CM | POA: Insufficient documentation

## 2021-08-29 DIAGNOSIS — O24415 Gestational diabetes mellitus in pregnancy, controlled by oral hypoglycemic drugs: Secondary | ICD-10-CM | POA: Insufficient documentation

## 2021-08-29 DIAGNOSIS — O4703 False labor before 37 completed weeks of gestation, third trimester: Secondary | ICD-10-CM | POA: Diagnosis present

## 2021-08-29 DIAGNOSIS — O26893 Other specified pregnancy related conditions, third trimester: Secondary | ICD-10-CM | POA: Diagnosis not present

## 2021-08-29 DIAGNOSIS — O98819 Other maternal infectious and parasitic diseases complicating pregnancy, unspecified trimester: Secondary | ICD-10-CM | POA: Diagnosis not present

## 2021-08-29 DIAGNOSIS — B3731 Acute candidiasis of vulva and vagina: Secondary | ICD-10-CM | POA: Diagnosis not present

## 2021-08-29 DIAGNOSIS — O98813 Other maternal infectious and parasitic diseases complicating pregnancy, third trimester: Secondary | ICD-10-CM | POA: Insufficient documentation

## 2021-08-29 DIAGNOSIS — Z3689 Encounter for other specified antenatal screening: Secondary | ICD-10-CM

## 2021-08-29 DIAGNOSIS — R1030 Lower abdominal pain, unspecified: Secondary | ICD-10-CM | POA: Insufficient documentation

## 2021-08-29 LAB — URINALYSIS, ROUTINE W REFLEX MICROSCOPIC
Bilirubin Urine: NEGATIVE
Bilirubin Urine: NEGATIVE
Glucose, UA: NEGATIVE mg/dL
Glucose, UA: NEGATIVE mg/dL
Hgb urine dipstick: NEGATIVE
Hgb urine dipstick: NEGATIVE
Ketones, ur: NEGATIVE mg/dL
Ketones, ur: NEGATIVE mg/dL
Nitrite: NEGATIVE
Nitrite: NEGATIVE
Protein, ur: NEGATIVE mg/dL
Protein, ur: NEGATIVE mg/dL
Specific Gravity, Urine: 1.012 (ref 1.005–1.030)
Specific Gravity, Urine: 1.012 (ref 1.005–1.030)
pH: 6 (ref 5.0–8.0)
pH: 6 (ref 5.0–8.0)

## 2021-08-29 LAB — WET PREP, GENITAL
Clue Cells Wet Prep HPF POC: NONE SEEN
Trich, Wet Prep: NONE SEEN
WBC, Wet Prep HPF POC: >=10 — AB (ref <10)

## 2021-08-29 MED ORDER — ACETAMINOPHEN 500 MG PO TABS
1000.0000 mg | ORAL_TABLET | Freq: Once | ORAL | Status: AC
  Administered 2021-08-29: 1000 mg via ORAL

## 2021-08-29 MED ORDER — ONDANSETRON 4 MG PO TBDP
8.0000 mg | ORAL_TABLET | Freq: Once | ORAL | Status: AC
  Administered 2021-08-29: 8 mg via ORAL
  Filled 2021-08-29: qty 2

## 2021-08-29 MED ORDER — TERCONAZOLE 0.4 % VA CREA: 1.0000 | TOPICAL_CREAM | Freq: Every day | VAGINAL | 0 refills | Status: DC

## 2021-08-29 MED ORDER — CALCIUM CARBONATE ANTACID 500 MG PO CHEW
2.0000 | CHEWABLE_TABLET | Freq: Once | ORAL | Status: AC
  Administered 2021-08-29: 400 mg via ORAL
  Filled 2021-08-29: qty 2

## 2021-08-29 NOTE — MAU Provider Note (Signed)
History     CSN: [id number]  Arrival date and time: 08/29/21 1951   Event Date/Time   First Provider Initiated Contact with Patient 08/29/21 2120      Chief Complaint  Patient presents with   Contractions   Stephanie Mack is a 31 y.o. [phone] at 94w5dwho [facility]  She presents today for Contractions. She reports contraction started around 1720 and they were "come and go."  She states the pain is located down low and was "so bad I was crying in the waiting room."  She reports some improvement since arriving into the room.  She reports some nausea as well, but had no emesis.  She reports drinking about two bottles of water and one juice.  She rates the pain a 6/10 and states it is worse with sitting up, but has no known relieving factors. She reports it was initially an 8/10.  She denies vaginal concerns including leaking, bleeding, and discharge. She denies sexually activity stating that it is painful.  She endorses fetal movement. She endorses some heartburn that is usually relieved with peppermint, but not as late.    OB History     Gravida  4   Para  2   Term  0   Preterm  2   AB  1   Living  1      SAB  1   IAB  0   Ectopic  0   Multiple  0   Live Births  2           Past Medical History:  Diagnosis Date   ADHD (attention deficit hyperactivity disorder)    Anemia    Anxiety    [place] 1 [place]    Chlamydia 05/31/2010   Chronic constipation    Depression    depression   GERD (gastroesophageal reflux disease)    with pregnancy   Gestational diabetes    current pregnancy   Gonorrhea contact, treated    Headache    Hypertension    Infection    UTI   Pregnancy induced hypertension    previous pregnancy   Pseudocyesis 2013   Seen in MAU for percieved FM, abd distension. Normal exam.    S[place]le cell trait ([place]    [place] 02/13/2017    Past Surgical History:  Procedure Laterality Date   CESAREAN  SECTION N/A 09/01/2012   Procedure:  Primary cesarean section with delivery of baby girl at 145  Apgars 1/1.  ;  Surgeon: BFrederico Hamman MD;  [facility];  Service: Obstetrics;  Laterality: N/A;   CESAREAN SECTION N/A 01/07/2015   Procedure: REPEAT CESAREAN SECTION;  Surgeon: CShelly Bombard MD;  [facility];  Service: Obstetrics;  Laterality: N/A;   NASAL SEPTUM SURGERY     WISDOM TOOTH EXTRACTION      Family History  Problem Relation Age of Onset   Kidney disease Mother    Vision loss Father    Hypertension Father    Cancer Sister    Asthma Brother    Diabetes Maternal Grandfather    Heart disease Neg Hx     Social History   Tobacco Use   Smoking status: Never   Smokeless tobacco: Never  Vaping Use   Vaping Use: Never used  Substance Use Topics   Alcohol use: No    Alcohol/week: 0.0 standard drinks   Drug use: No    Allergies: No Known Allergies  Medications Prior to Admission  Medication Sig Dispense Refill Last Dose   aspirin EC 81 MG tablet Take 81 mg by mouth daily. Swallow whole.   08/29/2021   cholecalciferol (VITAMIN D3) 25 MCG (1000 UNIT) tablet Take 1,000 Units by mouth daily.   08/29/2021   metFORMIN (GLUMETZA) 1000 MG (MOD) 24 hr tablet Take 1 tablet, by mouth, after breakfast and after supper. 60 tablet 2 08/29/2021   NIFEdipine (PROCARDIA) 20 MG capsule Take 20 mg by mouth once.   08/29/2021 at 1100   Prenat-Fe Poly-Methfol-FA-DHA (VITAFOL ULTRA) 29-0.6-0.4-200 MG CAPS Take 1 capsule by mouth daily before breakfast. 90 capsule 4 08/29/2021   ACCU-CHEK FASTCLIX LANCETS MISC TEST UPTO 4 TIMES DAILY 102 each 3    blood glucose meter kit and supplies KIT Dispense based on patient and insurance preference. Use up to four times daily as directed. (FOR ICD-9 250.00, 250.01). 1 each 0    cyclobenzaprine (FLEXERIL) 10 MG tablet Take 1 tablet (10 mg total) by mouth 2 (two) times daily as needed for muscle spasms. 20 tablet 0    glucose blood test strip  TEST UPTO 4 TIMES DAILY 100 each 3     Review of Systems  Gastrointestinal:  Positive for abdominal pain and nausea. Negative for vomiting.  Genitourinary:  Negative for difficulty urinating, dysuria, vaginal bleeding and vaginal discharge.  Neurological:  Negative for dizziness, light-headedness and headaches.  Physical Exam   Blood pressure 138/79, pulse 83, temperature 98.4 F (36.9 C), temperature source Oral, resp. rate 18, height _0  (1.575 m), weight 75.8 kg, last menstrual period 01/21/2021, SpO2 100 %.  Physical Exam Vitals reviewed. Exam conducted with a chaperone present.  Constitutional:      Appearance: Normal appearance.  HENT:     Head: Normocephalic and atraumatic.  Eyes:     Conjunctiva/sclera: Conjunctivae normal.  Cardiovascular:     Rate and Rhythm: Normal rate.  Pulmonary:     Effort: Pulmonary effort is normal. No respiratory distress.  Abdominal:     Tenderness: There is abdominal tenderness.    Genitourinary:    Comments: Speculum Exam: -External Genitalia: Skin appears white and flaky at area of perineum and posterior aspects of labia majora bilaterally.  Non tender, Apparent white watery discharge at introitus.  -Vaginal Vault: Pink mucosa with good rugae. No apparent discharge.  White plaques noted on vaginal walls. -wet prep collected -Cervix:Pink, no lesions, cysts, or polyps.  Appears closed. No active bleeding from os- -Bimanual Exam:  Closed/Thick Musculoskeletal:     Cervical back: Normal range of motion.  Skin:    General: Skin is warm and dry.  Neurological:     Mental Status: She is alert.  Psychiatric:        Mood and Affect: Mood normal.        Behavior: Behavior normal.    Fetal Assessment 135 bpm, Mod Var, -Decels, +15x15 Accels Toco: Occasional Mild Ctx graphed  MAU Course   Results for orders placed or performed during the hospital encounter of 08/29/21 (from the past 24 hour(s))  Urinalysis, Routine w reflex microscopic  Urine, Clean Catch     Status: Abnormal   Collection Time: 08/29/21  8:41 PM  Result Value Ref Range   Color, Urine YELLOW YELLOW   APPearance HAZY (A) CLEAR   Specific Gravity, Urine 1.012 1.005 - 1.030   pH 6.0 5.0 - 8.0   Glucose, UA NEGATIVE NEGATIVE mg/dL   Hgb urine dipstick NEGATIVE NEGATIVE   Bilirubin  Urine NEGATIVE NEGATIVE   Ketones, ur NEGATIVE NEGATIVE mg/dL   Protein, ur NEGATIVE NEGATIVE mg/dL   Nitrite NEGATIVE NEGATIVE   Leukocytes,Ua LARGE (A) NEGATIVE  Urinalysis, Routine w reflex microscopic     Status: Abnormal   Collection Time: 08/29/21  8:41 PM  Result Value Ref Range   Color, Urine YELLOW YELLOW   APPearance HAZY (A) CLEAR   Specific Gravity, Urine 1.012 1.005 - 1.030   pH 6.0 5.0 - 8.0   Glucose, UA NEGATIVE NEGATIVE mg/dL   Hgb urine dipstick NEGATIVE NEGATIVE   Bilirubin Urine NEGATIVE NEGATIVE   Ketones, ur NEGATIVE NEGATIVE mg/dL   Protein, ur NEGATIVE NEGATIVE mg/dL   Nitrite NEGATIVE NEGATIVE   Leukocytes,Ua LARGE (A) NEGATIVE   RBC / HPF 6-10 0 - 5 RBC/hpf   WBC, UA 6-10 0 - 5 WBC/hpf   Bacteria, UA RARE (A) NONE SEEN   Squamous Epithelial / LPF 6-10 0 - 5   Ca Oxalate Crys, UA PRESENT   Wet prep, genital     Status: Abnormal   Collection Time: 08/29/21  9:30 PM  Result Value Ref Range   Yeast Wet Prep HPF POC PRESENT (A) NONE SEEN   Trich, Wet Prep NONE SEEN NONE SEEN   Clue Cells Wet Prep HPF POC NONE SEEN NONE SEEN   WBC, Wet Prep HPF POC >=10 (A) <10   Sperm PRESENT    No results found.  MDM PE Labs: Wet prep EFM Analgesic Antacid Antiemetic Prescription Assessment and Plan  31 year old [id number]  SIUP at 32.5 weeks Cat I FT Abdominal Pain Heartburn  Nausea  -POC Reviewed -Exam performed and findings discussed. -Informed that findings are suggestive of yeast and with known T1DM, will treat. -Educated on how uncontrolled blood glucose levels can contribute to recurrent yeast infections.  Encouraged nutritional intake  modifications.  -Further informed on how yeast can cause some increased contractions during pregnancy.  -Rx for Terazol 7 to be sent to pharmacy on file.  -Informed that cervix is close and in absence of regular contractions, will not reassess.  -Discussed treatment of nausea with zofran. Patient agreeable. -Will also give tums for c/o heartburn.  Encouraged to continue usage at home and change to prescription medication if necessary. -Reviewed contraction complaint.  Informed that some noted on monitor, but specific abdominal pain likely related to fetal position. -Encouraged usage of maternity belt.   Maryann Conners MSN, CNM 08/29/2021, 9:21 PM    Reassessment (10:10 PM)  -Patient reports relief with medications. -Tylenol given. -Rx sent. -Nurse to give precautions.  -Discharged to home in stable condition.  Maryann Conners MSN, CNM Advanced Practice Provider, Center for [employer]

## 2021-08-29 NOTE — MAU Note (Signed)
.  Stephanie Mack is a 31 y.o. at 97w5dhere in MAU reporting: stomach tightening after nap. Pt reports they remain the same strength and frequency as when started. Denies SROM, vaginal bleeding or bloody show. Endorses+ fetal movement.  Onset of complaint: 1720 Pain score: 8 Vitals:   08/29/21 2019  BP: 138/79  Pulse: 83  Resp: 18  Temp: 98.4 F (36.9 C)  SpO2: 100%     FHT:135bpm Lab orders placed from triage:  UA

## 2021-09-01 ENCOUNTER — Ambulatory Visit: Payer: Medicaid Other | Admitting: *Deleted

## 2021-09-01 ENCOUNTER — Ambulatory Visit: Payer: Medicaid Other | Attending: Maternal & Fetal Medicine

## 2021-09-01 ENCOUNTER — Encounter: Payer: Self-pay | Admitting: *Deleted

## 2021-09-01 VITALS — BP 135/80 | HR 93

## 2021-09-01 DIAGNOSIS — O99340 Other mental disorders complicating pregnancy, unspecified trimester: Secondary | ICD-10-CM | POA: Diagnosis not present

## 2021-09-01 DIAGNOSIS — O09293 Supervision of pregnancy with other poor reproductive or obstetric history, third trimester: Secondary | ICD-10-CM

## 2021-09-01 DIAGNOSIS — O24912 Unspecified diabetes mellitus in pregnancy, second trimester: Secondary | ICD-10-CM | POA: Diagnosis present

## 2021-09-01 DIAGNOSIS — O403XX0 Polyhydramnios, third trimester, not applicable or unspecified: Secondary | ICD-10-CM

## 2021-09-01 DIAGNOSIS — O24113 Pre-existing diabetes mellitus, type 2, in pregnancy, third trimester: Secondary | ICD-10-CM | POA: Diagnosis not present

## 2021-09-01 DIAGNOSIS — F319 Bipolar disorder, unspecified: Secondary | ICD-10-CM

## 2021-09-01 DIAGNOSIS — O34219 Maternal care for unspecified type scar from previous cesarean delivery: Secondary | ICD-10-CM

## 2021-09-01 DIAGNOSIS — O10913 Unspecified pre-existing hypertension complicating pregnancy, third trimester: Secondary | ICD-10-CM

## 2021-09-01 DIAGNOSIS — Z8759 Personal history of other complications of pregnancy, childbirth and the puerperium: Secondary | ICD-10-CM | POA: Insufficient documentation

## 2021-09-01 DIAGNOSIS — O09292 Supervision of pregnancy with other poor reproductive or obstetric history, second trimester: Secondary | ICD-10-CM | POA: Insufficient documentation

## 2021-09-01 DIAGNOSIS — O10013 Pre-existing essential hypertension complicating pregnancy, third trimester: Secondary | ICD-10-CM

## 2021-09-01 DIAGNOSIS — Z3A33 33 weeks gestation of pregnancy: Secondary | ICD-10-CM

## 2021-09-03 ENCOUNTER — Inpatient Hospital Stay (HOSPITAL_COMMUNITY)
Admission: AD | Admit: 2021-09-03 | Discharge: 2021-09-03 | Disposition: A | Discharge status: Home / Self Care | Payer: Medicaid Other | Attending: Obstetrics & Gynecology | Admitting: Obstetrics & Gynecology

## 2021-09-03 DIAGNOSIS — O479 False labor, unspecified: Secondary | ICD-10-CM

## 2021-09-03 DIAGNOSIS — O4703 False labor before 37 completed weeks of gestation, third trimester: Secondary | ICD-10-CM | POA: Insufficient documentation

## 2021-09-03 DIAGNOSIS — Z3689 Encounter for other specified antenatal screening: Secondary | ICD-10-CM

## 2021-09-03 DIAGNOSIS — O09213 Supervision of pregnancy with history of pre-term labor, third trimester: Secondary | ICD-10-CM | POA: Insufficient documentation

## 2021-09-03 DIAGNOSIS — Z3A33 33 weeks gestation of pregnancy: Secondary | ICD-10-CM | POA: Diagnosis not present

## 2021-09-03 LAB — FETAL FIBRONECTIN: Fetal Fibronectin: NEGATIVE

## 2021-09-03 MED ORDER — ACETAMINOPHEN 500 MG PO TABS
1000.0000 mg | ORAL_TABLET | Freq: Once | ORAL | Status: AC
  Administered 2021-09-03: 1000 mg via ORAL
  Filled 2021-09-03: qty 2

## 2021-09-03 MED ORDER — LACTATED RINGERS IV BOLUS
1000.0000 mL | Freq: Once | INTRAVENOUS | Status: AC
  Administered 2021-09-03: 1000 mL via INTRAVENOUS

## 2021-09-03 MED ORDER — CYCLOBENZAPRINE HCL 5 MG PO TABS
10.0000 mg | ORAL_TABLET | Freq: Once | ORAL | Status: AC
  Administered 2021-09-03: 10 mg via ORAL
  Filled 2021-09-03: qty 2

## 2021-09-03 MED ORDER — NIFEDIPINE 10 MG PO CAPS
10.0000 mg | ORAL_CAPSULE | ORAL | Status: DC | PRN
  Administered 2021-09-03: 10 mg via ORAL
  Filled 2021-09-03: qty 1

## 2021-09-03 MED ORDER — LACTATED RINGERS IV BOLUS: 1000.0000 mL | Freq: Once | INTRAVENOUS | Status: DC

## 2021-09-03 NOTE — MAU Note (Signed)
.  Stephanie Mack is a 31 y.o. at 73w3dhere in MAU reporting: EMS arrival. Pt c/o ctx since 5pm about 3 in 20 min increased pelvic pressure. [place] walking around. Took frequent breaks and did make sure she was drinking water but started feeling ctx when they got home. Good fetal movement felt. Denies any leaking or bleeding at this time. Reports mild nauseat at this time.   Onset of complaint:5pm Pain score: 8/10 Vitals:   09/03/21 1751  BP: 132/78  Pulse: (!) 101  Resp: 18  Temp: 98.7 F (37.1 C)     FHT:140 Lab orders placed from triage:

## 2021-09-03 NOTE — MAU Provider Note (Signed)
History     CSN: [id number]  Arrival date and time: 09/03/21 1742   Event Date/Time   First Provider Initiated Contact with Patient 09/03/21 1800      Chief Complaint  Patient presents with   Contractions   HPI Stephanie Mack is a 31 y.o. [id number] at [redacted]w[redacted]d who presents to MAU via EMS with chief complaint of preterm contractions. Patient [place] her [place] the zoo today and reports prolonged walking in the sun. She reports recurrent painful contractions q 3-20 minutes. Pain score on arrival to MAU is 8/10. She has not taken medication for this complaint. She denies vaginal bleeding, leaking of fluid, decreased fetal movement, fever, falls, or recent illness.   Patient receives care with [facility] and her next appointment is Monday 09/06/21.  OB History     Gravida  4   Para  2   Term  0   Preterm  2   AB  1   Living  1      SAB  1   IAB  0   Ectopic  0   Multiple  0   Live Births  2           Past Medical History:  Diagnosis Date   ADHD (attention deficit hyperactivity disorder)    Anemia    Anxiety    Bipolar 1 disorder ([place])    Chlamydia 05/31/2010   Chronic constipation    Depression    depression   GERD (gastroesophageal reflux disease)    with pregnancy   Gestational diabetes    current pregnancy   Gonorrhea contact, treated    Headache    Hypertension    Infection    UTI   Pregnancy induced hypertension    previous pregnancy   Pseudocyesis 2013   Seen in MAU for percieved FM, abd distension. Normal exam.    Sickle cell trait ([place])    Type 2 diabetes mellitus ([place]) 02/13/2017    Past Surgical History:  Procedure Laterality Date   CESAREAN SECTION N/A 09/01/2012   Procedure:  Primary cesarean section with delivery of baby girl at 68.  Apgars 1/1.  ;  Surgeon: Frederico Hamman, MD;  Location: [facility];  Service: Obstetrics;  Laterality: N/A;   CESAREAN SECTION N/A 01/07/2015   Procedure: REPEAT CESAREAN SECTION;  Surgeon: Shelly Bombard, MD;  Location: [facility];  Service: Obstetrics;  Laterality: N/A;   NASAL SEPTUM SURGERY     WISDOM TOOTH EXTRACTION      Family History  Problem Relation Age of Onset   Kidney disease Mother    Vision loss Father    Hypertension Father    Cancer Sister    Asthma Brother    Diabetes Maternal Grandfather    Heart disease Neg Hx     Social History   Tobacco Use   Smoking status: Never   Smokeless tobacco: Never  Vaping Use   Vaping Use: Never used  Substance Use Topics   Alcohol use: No    Alcohol/week: 0.0 standard drinks   Drug use: No    Allergies: No Known Allergies  Medications Prior to Admission  Medication Sig Dispense Refill Last Dose   aspirin EC 81 MG tablet Take 81 mg by mouth daily. Swallow whole.   09/03/2021   cholecalciferol (VITAMIN D3) 25 MCG (1000 UNIT) tablet Take 1,000 Units by mouth daily.   09/03/2021   metFORMIN (GLUMETZA) 1000 MG (MOD) 24 hr tablet Take 1 tablet, by  mouth, after breakfast and after supper. 60 tablet 2 09/03/2021   NIFEdipine (PROCARDIA) 20 MG capsule Take 20 mg by mouth once.   09/03/2021   Prenat-Fe Poly-Methfol-FA-DHA (VITAFOL ULTRA) 29-0.6-0.4-200 MG CAPS Take 1 capsule by mouth daily before breakfast. 90 capsule 4 09/03/2021   ACCU-CHEK FASTCLIX LANCETS MISC TEST UPTO 4 TIMES DAILY 102 each 3    blood glucose meter kit and supplies KIT Dispense based on patient and insurance preference. Use up to four times daily as directed. (FOR ICD-9 250.00, 250.01). 1 each 0    cyclobenzaprine (FLEXERIL) 10 MG tablet Take 1 tablet (10 mg total) by mouth 2 (two) times daily as needed for muscle spasms. 20 tablet 0    glucose blood test strip TEST UPTO 4 TIMES DAILY 100 each 3    terconazole (TERAZOL 7) 0.4 % vaginal cream Place 1 applicator vaginally at bedtime. Use for seven days 45 g 0     Review of Systems  Gastrointestinal:  Positive for abdominal pain.  All other systems reviewed and are negative. Physical Exam   Blood pressure (!)  125/56, pulse (!) 101, temperature 98.7 F (37.1 C), resp. rate 18, last menstrual period 01/21/2021.  Physical Exam Vitals and nursing note reviewed. Exam conducted with a chaperone present.  Constitutional:      Appearance: Normal appearance.  Cardiovascular:     Rate and Rhythm: Normal rate and regular rhythm.     Pulses: Normal pulses.     Heart sounds: Normal heart sounds.  Pulmonary:     Effort: Pulmonary effort is normal.     Breath sounds: Normal breath sounds.  Abdominal:     Comments: Gravid  Genitourinary:    Comments: Pelvic exam: External genitalia normal, vaginal walls pink and well rugated, cervix visually closed, no lesions noted.   Neurological:     Mental Status: She is alert.    MAU Course  Procedures  MDM --Hx preterm birth. No FFN in current pregnancy. Discussed significance of negative result. Patient agreeable to collection --2005: CNM returned to bedside. Patient sleeping. Discussed reassuring toco, negative FFN, cervix remains closed. Patient agrees with plan to discharge home.  Patient Vitals for the past 24 hrs:  BP Temp Pulse Resp  09/03/21 1852 (!) 125/56 -- -- --  09/03/21 1751 132/78 98.7 F (37.1 C) (!) 101 18   Meds ordered this encounter  Medications   NIFEdipine (PROCARDIA) capsule 10 mg   lactated ringers bolus 1,000 mL   acetaminophen (TYLENOL) tablet 1,000 mg   cyclobenzaprine (FLEXERIL) tablet 10 mg   Results for orders placed or performed during the hospital encounter of 09/03/21 (from the past 24 hour(s))  Fetal fibronectin     Status: None   Collection Time: 09/03/21  6:10 PM  Result Value Ref Range   Fetal Fibronectin NEGATIVE NEGATIVE   Assessment and Plan  --30 y.o. [id number] at [redacted]w[redacted]d --Closed cervix --Negative FFN --Reactive tracing, quiet toco prior to discharge --Patient sleeping s/p treatments in MAU --Cervix remains closed prior to discharge --Discharge home in stable condition  SDarlina Mack MSA, MSN,  CNM

## 2021-09-06 NOTE — Patient Instructions (Signed)
Abagale Boulos  09/06/2021   Your procedure is scheduled on:  09/21/2021  Arrive at [facility] at Entrance C on [address] at [facility]  and [address]. You are invited to use the FREE valet parking or use the Visitor's parking deck.  Pick up the phone at the desk and dial [phone].  Call this number if you have problems the morning of surgery: [phone]  Remember:   Do not eat food:(After Midnight) Desps de medianoche.  Do not drink clear liquids: (After Midnight) Desps de medianoche.  Take these medicines the morning of surgery with A SIP OF WATER:  none   Do not wear jewelry, make-up or nail polish.  Do not wear lotions, powders, or perfumes. Do not wear deodorant.  Do not shave 48 hours prior to surgery.  Do not bring valuables to the hospital.  [facility] is not   responsible for any belongings or valuables brought to the hospital.  Contacts, dentures or bridgework may not be worn into surgery.  Leave suitcase in the car. After surgery it may be brought to your room.  For patients admitted to the hospital, checkout time is 11:00 AM the day of              discharge.      Please read over the following fact sheets that you were given:     Preparing for Surgery

## 2021-09-07 ENCOUNTER — Encounter: Payer: Self-pay | Admitting: *Deleted

## 2021-09-07 ENCOUNTER — Encounter (HOSPITAL_COMMUNITY): Payer: Self-pay

## 2021-09-08 ENCOUNTER — Ambulatory Visit: Payer: Medicaid Other | Admitting: *Deleted

## 2021-09-08 ENCOUNTER — Ambulatory Visit: Payer: Medicaid Other | Attending: Maternal & Fetal Medicine

## 2021-09-08 VITALS — BP 137/75 | HR 81

## 2021-09-08 DIAGNOSIS — O24912 Unspecified diabetes mellitus in pregnancy, second trimester: Secondary | ICD-10-CM | POA: Diagnosis not present

## 2021-09-08 DIAGNOSIS — O34219 Maternal care for unspecified type scar from previous cesarean delivery: Secondary | ICD-10-CM

## 2021-09-08 DIAGNOSIS — O10013 Pre-existing essential hypertension complicating pregnancy, third trimester: Secondary | ICD-10-CM | POA: Diagnosis not present

## 2021-09-08 DIAGNOSIS — Z8759 Personal history of other complications of pregnancy, childbirth and the puerperium: Secondary | ICD-10-CM | POA: Diagnosis present

## 2021-09-08 DIAGNOSIS — O24013 Pre-existing diabetes mellitus, type 1, in pregnancy, third trimester: Secondary | ICD-10-CM

## 2021-09-08 DIAGNOSIS — O09292 Supervision of pregnancy with other poor reproductive or obstetric history, second trimester: Secondary | ICD-10-CM | POA: Insufficient documentation

## 2021-09-08 DIAGNOSIS — O09293 Supervision of pregnancy with other poor reproductive or obstetric history, third trimester: Secondary | ICD-10-CM

## 2021-09-08 DIAGNOSIS — O24415 Gestational diabetes mellitus in pregnancy, controlled by oral hypoglycemic drugs: Secondary | ICD-10-CM

## 2021-09-08 DIAGNOSIS — F319 Bipolar disorder, unspecified: Secondary | ICD-10-CM

## 2021-09-08 DIAGNOSIS — O403XX0 Polyhydramnios, third trimester, not applicable or unspecified: Secondary | ICD-10-CM | POA: Diagnosis not present

## 2021-09-08 DIAGNOSIS — Z3A34 34 weeks gestation of pregnancy: Secondary | ICD-10-CM

## 2021-09-08 DIAGNOSIS — O99343 Other mental disorders complicating pregnancy, third trimester: Secondary | ICD-10-CM

## 2021-09-09 ENCOUNTER — Inpatient Hospital Stay (HOSPITAL_COMMUNITY)
Admission: AD | Admit: 2021-09-09 | Discharge: 2021-09-09 | Disposition: A | Discharge status: Home / Self Care | Payer: Medicaid Other | Attending: Obstetrics & Gynecology | Admitting: Obstetrics & Gynecology

## 2021-09-09 ENCOUNTER — Encounter (HOSPITAL_COMMUNITY): Payer: Self-pay | Admitting: Obstetrics & Gynecology

## 2021-09-09 DIAGNOSIS — Z3689 Encounter for other specified antenatal screening: Secondary | ICD-10-CM

## 2021-09-09 DIAGNOSIS — Z3A34 34 weeks gestation of pregnancy: Secondary | ICD-10-CM | POA: Diagnosis not present

## 2021-09-09 DIAGNOSIS — Z79899 Other long term (current) drug therapy: Secondary | ICD-10-CM | POA: Insufficient documentation

## 2021-09-09 DIAGNOSIS — O4703 False labor before 37 completed weeks of gestation, third trimester: Secondary | ICD-10-CM

## 2021-09-09 DIAGNOSIS — O09293 Supervision of pregnancy with other poor reproductive or obstetric history, third trimester: Secondary | ICD-10-CM | POA: Insufficient documentation

## 2021-09-09 DIAGNOSIS — O24113 Pre-existing diabetes mellitus, type 2, in pregnancy, third trimester: Secondary | ICD-10-CM | POA: Insufficient documentation

## 2021-09-09 DIAGNOSIS — O10913 Unspecified pre-existing hypertension complicating pregnancy, third trimester: Secondary | ICD-10-CM | POA: Insufficient documentation

## 2021-09-09 DIAGNOSIS — O34219 Maternal care for unspecified type scar from previous cesarean delivery: Secondary | ICD-10-CM | POA: Insufficient documentation

## 2021-09-09 HISTORY — DX: Benign neoplasm of connective and other soft tissue, unspecified: D21.9

## 2021-09-09 LAB — URINALYSIS, ROUTINE W REFLEX MICROSCOPIC
Bilirubin Urine: NEGATIVE
Glucose, UA: NEGATIVE mg/dL
Hgb urine dipstick: NEGATIVE
Ketones, ur: NEGATIVE mg/dL
Leukocytes,Ua: NEGATIVE
Nitrite: NEGATIVE
Protein, ur: NEGATIVE mg/dL
Specific Gravity, Urine: 1.002 — ABNORMAL LOW (ref 1.005–1.030)
pH: 7 (ref 5.0–8.0)

## 2021-09-09 MED ORDER — LACTATED RINGERS IV BOLUS
1000.0000 mL | Freq: Once | INTRAVENOUS | Status: AC
  Administered 2021-09-09: 1000 mL via INTRAVENOUS

## 2021-09-09 MED ORDER — NIFEDIPINE 10 MG PO CAPS
10.0000 mg | ORAL_CAPSULE | ORAL | Status: AC
  Administered 2021-09-09 (×2): 10 mg via ORAL
  Filled 2021-09-09 (×3): qty 1

## 2021-09-09 MED ORDER — ONDANSETRON HCL 4 MG/5ML PO SOLN
4.0000 mg | Freq: Once | ORAL | Status: DC
  Filled 2021-09-09: qty 5

## 2021-09-09 MED ORDER — CYCLOBENZAPRINE HCL 5 MG PO TABS
10.0000 mg | ORAL_TABLET | Freq: Once | ORAL | Status: AC
  Administered 2021-09-09: 10 mg via ORAL
  Filled 2021-09-09: qty 2

## 2021-09-09 MED ORDER — ONDANSETRON HCL 4 MG/2ML IJ SOLN
4.0000 mg | Freq: Once | INTRAMUSCULAR | Status: AC
  Administered 2021-09-09: 4 mg via INTRAVENOUS
  Filled 2021-09-09: qty 2

## 2021-09-09 NOTE — MAU Provider Note (Signed)
History     CSN: [id number]  Arrival date and time: 09/09/21 1300   Event Date/Time   First Provider Initiated Contact with Patient 09/09/21 1338      Chief Complaint  Patient presents with   Contractions   31 y.o. [id number] @34 .2 wks presenting with ctx. Reports onset yesterday.Ctx initially were q10 min but since 1100 today have been q5 min. Denies VB or LOF. Reports good FM. Denies urinary sx. Endorses LBP. She was seen in MAU 6 days ago for ctx and was given fluids and Procardia which helped.    OB History     Gravida  4   Para  2   Term  0   Preterm  2   AB  1   Living  1      SAB  1   IAB  0   Ectopic  0   Multiple  0   Live Births  2           Past Medical History:  Diagnosis Date   ADHD (attention deficit hyperactivity disorder)    Anemia    Anxiety    Bipolar 1 disorder ([place])    Chlamydia 05/31/2010   Chronic constipation    Depression    depression   Fibroid    GERD (gastroesophageal reflux disease)    with pregnancy   Gestational diabetes    current pregnancy   Gonorrhea contact, treated    Headache    Hypertension    Infection    UTI   Pregnancy induced hypertension    previous pregnancy   Pseudocyesis 2013   Seen in MAU for percieved FM, abd distension. Normal exam.    Sickle cell trait ([place])    Type 2 diabetes mellitus ([place]) 02/13/2017    Past Surgical History:  Procedure Laterality Date   CESAREAN SECTION N/A 09/01/2012   Procedure:  Primary cesarean section with delivery of baby girl at 38.  Apgars 1/1.  ;  Surgeon: Frederico Hamman, MD;  Location: [facility];  Service: Obstetrics;  Laterality: N/A;   CESAREAN SECTION N/A 01/07/2015   Procedure: REPEAT CESAREAN SECTION;  Surgeon: Shelly Bombard, MD;  Location: [facility];  Service: Obstetrics;  Laterality: N/A;   DILATION AND CURETTAGE OF UTERUS     NASAL SEPTUM SURGERY     WISDOM TOOTH EXTRACTION      Family History  Problem Relation Age of Onset   Kidney disease  Mother    Lupus Mother    Vision loss Father    Hypertension Father    Cancer Sister    Asthma Brother    Diabetes Maternal Grandfather    Heart disease Neg Hx     Social History   Tobacco Use   Smoking status: Never   Smokeless tobacco: Never  Vaping Use   Vaping Use: Never used  Substance Use Topics   Alcohol use: No    Alcohol/week: 0.0 standard drinks of alcohol   Drug use: No    Allergies: No Known Allergies  Medications Prior to Admission  Medication Sig Dispense Refill Last Dose   aspirin EC 81 MG tablet Take 81 mg by mouth daily. Swallow whole.   09/09/2021   cholecalciferol (VITAMIN D3) 25 MCG (1000 UNIT) tablet Take 1,000 Units by mouth daily.   09/09/2021   cyclobenzaprine (FLEXERIL) 10 MG tablet Take 1 tablet (10 mg total) by mouth 2 (two) times daily as needed for muscle spasms. 20 tablet 0 09/09/2021  metFORMIN (GLUMETZA) 1000 MG (MOD) 24 hr tablet Take 1 tablet, by mouth, after breakfast and after supper. 60 tablet 2 09/09/2021   NIFEdipine (PROCARDIA) 20 MG capsule Take 20 mg by mouth once.   09/09/2021 at 0900   Prenat-Fe Poly-Methfol-FA-DHA (VITAFOL ULTRA) 29-0.6-0.4-200 MG CAPS Take 1 capsule by mouth daily before breakfast. 90 capsule 4 09/09/2021   ACCU-CHEK FASTCLIX LANCETS MISC TEST UPTO 4 TIMES DAILY 102 each 3    blood glucose meter kit and supplies KIT Dispense based on patient and insurance preference. Use up to four times daily as directed. (FOR ICD-9 250.00, 250.01). 1 each 0    glucose blood test strip TEST UPTO 4 TIMES DAILY 100 each 3    terconazole (TERAZOL 7) 0.4 % vaginal cream Place 1 applicator vaginally at bedtime. Use for seven days 45 g 0     Review of Systems  Gastrointestinal:  Positive for abdominal pain (ctx).  Genitourinary:  Negative for dysuria, hematuria, urgency, vaginal bleeding and vaginal discharge.  Musculoskeletal:  Positive for back pain.   Physical Exam   Blood pressure (!) 119/57, pulse 96, temperature 97.9 F (36.6 C),  temperature source Oral, resp. rate 16, last menstrual period 01/21/2021, SpO2 99 %.  Physical Exam Vitals and nursing note reviewed. Exam conducted with a chaperone present.  Constitutional:      General: She is not in acute distress.    Appearance: Normal appearance.  HENT:     Head: Normocephalic and atraumatic.  Cardiovascular:     Rate and Rhythm: Normal rate.  Pulmonary:     Effort: Pulmonary effort is normal. No respiratory distress.  Abdominal:     Palpations: Abdomen is soft.     Tenderness: There is no abdominal tenderness.     Comments: gravid  Genitourinary:    Comments: VE: closed/long Musculoskeletal:        General: Normal range of motion.     Cervical back: Normal range of motion.  Skin:    General: Skin is warm and dry.  Neurological:     General: No focal deficit present.     Mental Status: She is alert and oriented to person, place, and time.  Psychiatric:        Mood and Affect: Mood normal.        Behavior: Behavior normal.   EFM: 135 bpm, mod variability, + accels, no decels Toco: rare  Results for orders placed or performed during the hospital encounter of 09/09/21 (from the past 24 hour(s))  Urinalysis, Routine w reflex microscopic Urine, Clean Catch     Status: Abnormal   Collection Time: 09/09/21  2:54 PM  Result Value Ref Range   Color, Urine STRAW (A) YELLOW   APPearance CLEAR CLEAR   Specific Gravity, Urine 1.002 (L) 1.005 - 1.030   pH 7.0 5.0 - 8.0   Glucose, UA NEGATIVE NEGATIVE mg/dL   Hgb urine dipstick NEGATIVE NEGATIVE   Bilirubin Urine NEGATIVE NEGATIVE   Ketones, ur NEGATIVE NEGATIVE mg/dL   Protein, ur NEGATIVE NEGATIVE mg/dL   Nitrite NEGATIVE NEGATIVE   Leukocytes,Ua NEGATIVE NEGATIVE   MAU Course  Procedures LR Flexeril Procardia x2  MDM Prenatal records reviewed, pregnancy complicated by type 2 diabetes, previous C-section x2, history of HSV, anemia, bipolar, chronic hypertension on Procardia, polyhydramnios, history  of severe preeclampsia, history of preterm birth, Rh-, rubella nonimmune. Pt reports improvement in ctx, now only cramping. Rare ctx on toco. Cervix unchanged. Stable for discharge home.   Assessment and Plan  1. [redacted] weeks gestation of pregnancy   2. NST (non-stress test) reactive   3. Preterm uterine contractions in third trimester, antepartum    Discharge home Follow up at [facility] in 4 days as scheduled PTL precautions  Allergies as of 09/09/2021   No Known Allergies      Medication List     STOP taking these medications    terconazole 0.4 % vaginal cream Commonly known as: TERAZOL 7       TAKE these medications    Accu-Chek FastClix Lancets Misc TEST UPTO 4 TIMES DAILY   aspirin EC 81 MG tablet Take 81 mg by mouth daily. Swallow whole.   blood glucose meter kit and supplies Kit Dispense based on patient and insurance preference. Use up to four times daily as directed. (FOR ICD-9 250.00, 250.01).   cholecalciferol 25 MCG (1000 UNIT) tablet Commonly known as: VITAMIN D3 Take 1,000 Units by mouth daily.   cyclobenzaprine 10 MG tablet Commonly known as: FLEXERIL Take 1 tablet (10 mg total) by mouth 2 (two) times daily as needed for muscle spasms.   glucose blood test strip TEST UPTO 4 TIMES DAILY   metFORMIN 1000 MG (MOD) 24 hr tablet Commonly known as: GLUMETZA Take 1 tablet, by mouth, after breakfast and after supper.   NIFEdipine 20 MG capsule Commonly known as: PROCARDIA Take 20 mg by mouth once.   Vitafol Ultra 29-0.6-0.4-200 MG Caps Take 1 capsule by mouth daily before breakfast.        Julianne Handler, CNM 09/09/2021, 4:22 PM

## 2021-09-09 NOTE — MAU Note (Signed)
Stephanie Mack is a 31 y.o. at 54w2dhere in MAU reporting: started contracting at 1100, every 5 min. No bleeding or leaking, reports +FM.  Scheduled for R C/S 6/20 Onset of complaint: 1100 Pain score: 8 Vitals:   09/09/21 1304  BP: (!) 137/91  Pulse: (!) 106  Resp: 17  Temp: 97.9 F (36.6 C)     FHT:144 Lab orders placed from triage:  none

## 2021-09-14 ENCOUNTER — Other Ambulatory Visit: Payer: Self-pay

## 2021-09-14 ENCOUNTER — Encounter (HOSPITAL_COMMUNITY): Payer: Self-pay | Admitting: Obstetrics & Gynecology

## 2021-09-14 ENCOUNTER — Inpatient Hospital Stay (HOSPITAL_COMMUNITY)
Admission: AD | Admit: 2021-09-14 | Discharge: 2021-09-14 | Disposition: A | Discharge status: Home / Self Care | Payer: Medicaid Other | Attending: Obstetrics & Gynecology | Admitting: Obstetrics & Gynecology

## 2021-09-14 DIAGNOSIS — O26893 Other specified pregnancy related conditions, third trimester: Secondary | ICD-10-CM | POA: Diagnosis present

## 2021-09-14 DIAGNOSIS — E118 Type 2 diabetes mellitus with unspecified complications: Secondary | ICD-10-CM | POA: Insufficient documentation

## 2021-09-14 DIAGNOSIS — Z3A35 35 weeks gestation of pregnancy: Secondary | ICD-10-CM | POA: Diagnosis not present

## 2021-09-14 DIAGNOSIS — O4703 False labor before 37 completed weeks of gestation, third trimester: Secondary | ICD-10-CM | POA: Insufficient documentation

## 2021-09-14 DIAGNOSIS — Z0371 Encounter for suspected problem with amniotic cavity and membrane ruled out: Secondary | ICD-10-CM | POA: Diagnosis not present

## 2021-09-14 DIAGNOSIS — R1030 Lower abdominal pain, unspecified: Secondary | ICD-10-CM | POA: Diagnosis not present

## 2021-09-14 DIAGNOSIS — O24113 Pre-existing diabetes mellitus, type 2, in pregnancy, third trimester: Secondary | ICD-10-CM | POA: Insufficient documentation

## 2021-09-14 DIAGNOSIS — Z98891 History of uterine scar from previous surgery: Secondary | ICD-10-CM

## 2021-09-14 DIAGNOSIS — O34211 Maternal care for low transverse scar from previous cesarean delivery: Secondary | ICD-10-CM | POA: Insufficient documentation

## 2021-09-14 DIAGNOSIS — O34212 Maternal care for vertical scar from previous cesarean delivery: Secondary | ICD-10-CM | POA: Diagnosis not present

## 2021-09-14 DIAGNOSIS — O479 False labor, unspecified: Secondary | ICD-10-CM | POA: Diagnosis not present

## 2021-09-14 LAB — POCT FERN TEST: POCT Fern Test: NEGATIVE

## 2021-09-14 NOTE — MAU Provider Note (Signed)
Chief Complaint  Patient presents with   Abdominal Pain   Rupture of Membranes   Vaginal Discharge     Event Date/Time   First Provider Initiated Contact with Patient 09/14/21 1710      S: Stephanie Mack  is a 31 y.o. y.o. year old [id number] female at 62w0dweeks gestation who presents to MAU reporting leaking of white fluid at 1600 and a prolong low abd cramp and a few cramps since then.   Hx of classical C/S in 2014 and LTCS in 2016. Planned repeat C/S scheduled 6/20.   Contractions: irreg Vaginal bleeding: Denies Fetal movement: Active  O: Patient Vitals for the past 24 hrs:  BP Pulse Resp  09/14/21 1642 139/85 83 18   General: NAD Heart: Regular rate Lungs: Normal rate and effort Abd: Soft, NT, Gravid, S=D Pelvic: NEFG, Neg pooling, small-mod amount of creamy, white, odorless discharge, no blood.  Dilation: Closed Effacement (%): Thick Cervical Position: Posterior Station: Ballotable Presentation: Undeterminable Exam by:: V. Melvia Matousek,CNM  EFM: 135, Moderate variability, 15 x 15 accelerations, no decelerations Toco: UI  Neg Fern  A: 362w0deek IUP No evidence of SROM or labor FHR reactive  P: Discharge home in stable condition. Labor precautions and fetal kick counts. Follow-up as scheduled for prenatal visit or sooner as needed if symptoms worsen. Return to maternity admissions as needed if symptoms worsen.  SmTamala Julian[place]/13/2023 7:04 PM  2

## 2021-09-14 NOTE — MAU Note (Addendum)
...  Stephanie Mack is a 31 y.o. at 97w0dhere in MAU reporting: Patient reports she was sitting on the toilet at 1600 and she began having constant lower abdominal pain that feels like a cramp. She reports she urinated and then saw "white discharge" on her leg. She reports this pain is constant and is not coming and going like contractions. Denies VB. +FM.   She denies continual leaking of fluid. Fern slide collected.  Onset of complaint: Today at 1600 Pain score: 9/10 lower abdomen  Lab orders placed from triage:m UA

## 2021-09-15 ENCOUNTER — Ambulatory Visit: Payer: Medicaid Other | Attending: Obstetrics and Gynecology | Admitting: *Deleted

## 2021-09-15 ENCOUNTER — Ambulatory Visit: Payer: Medicaid Other | Admitting: *Deleted

## 2021-09-15 VITALS — BP 134/84 | HR 87

## 2021-09-15 DIAGNOSIS — O10913 Unspecified pre-existing hypertension complicating pregnancy, third trimester: Secondary | ICD-10-CM | POA: Diagnosis present

## 2021-09-15 DIAGNOSIS — E119 Type 2 diabetes mellitus without complications: Secondary | ICD-10-CM | POA: Insufficient documentation

## 2021-09-15 DIAGNOSIS — Z3A35 35 weeks gestation of pregnancy: Secondary | ICD-10-CM | POA: Diagnosis not present

## 2021-09-15 DIAGNOSIS — O24113 Pre-existing diabetes mellitus, type 2, in pregnancy, third trimester: Secondary | ICD-10-CM | POA: Diagnosis not present

## 2021-09-15 NOTE — Procedures (Signed)
Stephanie Mack 01-Oct-1990 [redacted]w[redacted]d Fetus A Non-Stress Test Interpretation for 09/15/21  Indication: Chronic Hypertenstion,T2DM  Fetal Heart Rate A Mode: External Baseline Rate (A): 150 bpm Variability: Moderate Accelerations: 15 x 15 Decelerations: Variable Multiple birth?: No  Uterine Activity Mode: Palpation, Toco Contraction Frequency (min): occ with ui Contraction Duration (sec): 50-60 Contraction Quality: Mild Resting Tone Palpated: Relaxed Resting Time: Adequate  Interpretation (Fetal Testing) Nonstress Test Interpretation: Reactive Overall Impression: Reassuring for gestational age Comments: Dr. BGertie Exonreviewed tracing

## 2021-09-20 ENCOUNTER — Encounter (HOSPITAL_COMMUNITY)
Admission: RE | Admit: 2021-09-20 | Discharge: 2021-09-20 | Disposition: A | Discharge status: Home / Self Care | Payer: Medicaid Other | Source: Ambulatory Visit | Attending: Family Medicine | Admitting: Family Medicine

## 2021-09-20 LAB — CBC
HCT: 31.4 % — ABNORMAL LOW (ref 36.0–46.0)
Hemoglobin: 10 g/dL — ABNORMAL LOW (ref 12.0–15.0)
MCH: 25.3 pg — ABNORMAL LOW (ref 26.0–34.0)
MCHC: 31.8 g/dL (ref 30.0–36.0)
MCV: 79.5 fL — ABNORMAL LOW (ref 80.0–100.0)
Platelets: 195 10*3/uL (ref 150–400)
RBC: 3.95 MIL/uL (ref 3.87–5.11)
RDW: 14.7 % (ref 11.5–15.5)
WBC: 8.6 10*3/uL (ref 4.0–10.5)
nRBC: 0 % (ref 0.0–0.2)

## 2021-09-20 LAB — TYPE AND SCREEN
ABO/RH(D): B NEG
Antibody Screen: POSITIVE

## 2021-09-20 MED ORDER — CEFAZOLIN SODIUM-DEXTROSE 2-4 GM/100ML-% IV SOLN: 2.0000 g | INTRAVENOUS | Status: AC

## 2021-09-20 MED ORDER — CEFAZOLIN SODIUM-DEXTROSE 2-4 GM/100ML-% IV SOLN: 2.0000 g | INTRAVENOUS | Status: DC

## 2021-09-20 MED ORDER — LACTATED RINGERS IV SOLN: INTRAVENOUS | Status: DC

## 2021-09-20 MED ORDER — POVIDONE-IODINE 10 % EX SWAB: 2.0000 "application " | Freq: Once | CUTANEOUS | Status: DC

## 2021-09-20 MED ORDER — FENTANYL CITRATE (PF) 100 MCG/2ML IJ SOLN
INTRAMUSCULAR | Status: AC
  Filled 2021-09-20: qty 2

## 2021-09-20 MED ORDER — MORPHINE SULFATE (PF) 0.5 MG/ML IJ SOLN
INTRAMUSCULAR | Status: AC
  Filled 2021-09-20: qty 10

## 2021-09-21 ENCOUNTER — Inpatient Hospital Stay (HOSPITAL_COMMUNITY): Payer: Medicaid Other | Admitting: Anesthesiology

## 2021-09-21 ENCOUNTER — Encounter (HOSPITAL_COMMUNITY)
Admission: RE | Disposition: A | Discharge status: Home / Self Care | Payer: Self-pay | Source: Ambulatory Visit | Attending: Obstetrics & Gynecology

## 2021-09-21 ENCOUNTER — Inpatient Hospital Stay (HOSPITAL_COMMUNITY)
Admission: RE | Admit: 2021-09-21 | Discharge: 2021-09-23 | DRG: 783 | Disposition: A | Discharge status: Home / Self Care | Payer: Medicaid Other | Source: Ambulatory Visit | Attending: Obstetrics & Gynecology | Admitting: Obstetrics & Gynecology

## 2021-09-21 ENCOUNTER — Encounter (HOSPITAL_COMMUNITY): Payer: Self-pay | Admitting: Obstetrics & Gynecology

## 2021-09-21 ENCOUNTER — Other Ambulatory Visit: Payer: Self-pay

## 2021-09-21 DIAGNOSIS — F319 Bipolar disorder, unspecified: Secondary | ICD-10-CM | POA: Diagnosis present

## 2021-09-21 DIAGNOSIS — E119 Type 2 diabetes mellitus without complications: Secondary | ICD-10-CM

## 2021-09-21 DIAGNOSIS — O99344 Other mental disorders complicating childbirth: Secondary | ICD-10-CM | POA: Diagnosis present

## 2021-09-21 DIAGNOSIS — O34212 Maternal care for vertical scar from previous cesarean delivery: Principal | ICD-10-CM | POA: Diagnosis present

## 2021-09-21 DIAGNOSIS — A6000 Herpesviral infection of urogenital system, unspecified: Secondary | ICD-10-CM | POA: Diagnosis present

## 2021-09-21 DIAGNOSIS — Z302 Encounter for sterilization: Secondary | ICD-10-CM

## 2021-09-21 DIAGNOSIS — Z6791 Unspecified blood type, Rh negative: Secondary | ICD-10-CM | POA: Diagnosis not present

## 2021-09-21 DIAGNOSIS — O2412 Pre-existing diabetes mellitus, type 2, in childbirth: Secondary | ICD-10-CM | POA: Diagnosis present

## 2021-09-21 DIAGNOSIS — O99824 Streptococcus B carrier state complicating childbirth: Secondary | ICD-10-CM | POA: Diagnosis present

## 2021-09-21 DIAGNOSIS — O1002 Pre-existing essential hypertension complicating childbirth: Secondary | ICD-10-CM | POA: Diagnosis present

## 2021-09-21 DIAGNOSIS — O403XX0 Polyhydramnios, third trimester, not applicable or unspecified: Secondary | ICD-10-CM | POA: Diagnosis present

## 2021-09-21 DIAGNOSIS — F909 Attention-deficit hyperactivity disorder, unspecified type: Secondary | ICD-10-CM | POA: Diagnosis present

## 2021-09-21 DIAGNOSIS — O26893 Other specified pregnancy related conditions, third trimester: Secondary | ICD-10-CM | POA: Diagnosis present

## 2021-09-21 DIAGNOSIS — O34219 Maternal care for unspecified type scar from previous cesarean delivery: Secondary | ICD-10-CM

## 2021-09-21 DIAGNOSIS — O2492 Unspecified diabetes mellitus in childbirth: Secondary | ICD-10-CM | POA: Diagnosis not present

## 2021-09-21 DIAGNOSIS — O365930 Maternal care for other known or suspected poor fetal growth, third trimester, not applicable or unspecified: Secondary | ICD-10-CM | POA: Diagnosis present

## 2021-09-21 DIAGNOSIS — Z3A36 36 weeks gestation of pregnancy: Secondary | ICD-10-CM

## 2021-09-21 DIAGNOSIS — Z7984 Long term (current) use of oral hypoglycemic drugs: Secondary | ICD-10-CM | POA: Diagnosis not present

## 2021-09-21 DIAGNOSIS — O9832 Other infections with a predominantly sexual mode of transmission complicating childbirth: Secondary | ICD-10-CM | POA: Diagnosis present

## 2021-09-21 DIAGNOSIS — Z98891 History of uterine scar from previous surgery: Principal | ICD-10-CM

## 2021-09-21 DIAGNOSIS — I10 Essential (primary) hypertension: Secondary | ICD-10-CM | POA: Diagnosis present

## 2021-09-21 LAB — GLUCOSE, CAPILLARY
Glucose-Capillary: 106 mg/dL — ABNORMAL HIGH (ref 70–99)
Glucose-Capillary: 117 mg/dL — ABNORMAL HIGH (ref 70–99)

## 2021-09-21 LAB — RPR: RPR Ser Ql: NONREACTIVE

## 2021-09-21 LAB — CREATININE, SERUM
Creatinine, Ser: 0.48 mg/dL (ref 0.44–1.00)
GFR, Estimated: >60 mL/min (ref >60)

## 2021-09-21 SURGERY — Surgical Case
Anesthesia: Spinal

## 2021-09-21 MED ORDER — KETOROLAC TROMETHAMINE 30 MG/ML IJ SOLN: 30.0000 mg | Freq: Four times a day (QID) | INTRAMUSCULAR | Status: AC | PRN

## 2021-09-21 MED ORDER — DIBUCAINE (PERIANAL) 1 % EX OINT: 1.0000 | TOPICAL_OINTMENT | CUTANEOUS | Status: DC | PRN

## 2021-09-21 MED ORDER — SIMETHICONE 80 MG PO CHEW
80.0000 mg | CHEWABLE_TABLET | Freq: Three times a day (TID) | ORAL | Status: DC
  Administered 2021-09-21 – 2021-09-23 (×6): 80 mg via ORAL
  Filled 2021-09-21 (×6): qty 1

## 2021-09-21 MED ORDER — ACETAMINOPHEN 325 MG PO TABS: 325.0000 mg | ORAL_TABLET | ORAL | Status: DC | PRN

## 2021-09-21 MED ORDER — KETOROLAC TROMETHAMINE 30 MG/ML IJ SOLN: 30.0000 mg | Freq: Four times a day (QID) | INTRAMUSCULAR | Status: DC | PRN

## 2021-09-21 MED ORDER — ONDANSETRON HCL 4 MG/2ML IJ SOLN: 4.0000 mg | Freq: Once | INTRAMUSCULAR | Status: DC | PRN

## 2021-09-21 MED ORDER — FENTANYL CITRATE (PF) 100 MCG/2ML IJ SOLN
INTRAMUSCULAR | Status: DC | PRN
Start: 2021-09-21 — End: 2021-09-21
  Administered 2021-09-21: 15 ug via INTRATHECAL

## 2021-09-21 MED ORDER — SODIUM CHLORIDE 0.9 % IR SOLN
Status: DC | PRN
  Administered 2021-09-21: 1

## 2021-09-21 MED ORDER — GABAPENTIN 100 MG PO CAPS: 100.0000 mg | ORAL_CAPSULE | Freq: Three times a day (TID) | ORAL | Status: DC | PRN

## 2021-09-21 MED ORDER — ACETAMINOPHEN 10 MG/ML IV SOLN
INTRAVENOUS | Status: AC
  Filled 2021-09-21: qty 100

## 2021-09-21 MED ORDER — SIMETHICONE 80 MG PO CHEW: 80.0000 mg | CHEWABLE_TABLET | ORAL | Status: DC | PRN

## 2021-09-21 MED ORDER — WITCH HAZEL-GLYCERIN EX PADS: 1.0000 | MEDICATED_PAD | CUTANEOUS | Status: DC | PRN

## 2021-09-21 MED ORDER — MENTHOL 3 MG MT LOZG: 1.0000 | LOZENGE | OROMUCOSAL | Status: DC | PRN

## 2021-09-21 MED ORDER — ENOXAPARIN SODIUM 40 MG/0.4ML IJ SOSY
40.0000 mg | PREFILLED_SYRINGE | INTRAMUSCULAR | Status: DC
  Administered 2021-09-22 – 2021-09-23 (×2): 40 mg via SUBCUTANEOUS
  Filled 2021-09-21 (×2): qty 0.4

## 2021-09-21 MED ORDER — BUPIVACAINE HCL (PF) 0.5 % IJ SOLN
INTRAMUSCULAR | Status: AC
  Filled 2021-09-21: qty 30

## 2021-09-21 MED ORDER — PRENATAL MULTIVITAMIN CH
1.0000 | ORAL_TABLET | Freq: Every day | ORAL | Status: DC
  Administered 2021-09-21 – 2021-09-23 (×3): 1 via ORAL
  Filled 2021-09-21 (×3): qty 1

## 2021-09-21 MED ORDER — PHENYLEPHRINE HCL-NACL 20-0.9 MG/250ML-% IV SOLN
INTRAVENOUS | Status: AC
  Filled 2021-09-21: qty 250

## 2021-09-21 MED ORDER — ONDANSETRON HCL 4 MG/2ML IJ SOLN
INTRAMUSCULAR | Status: DC | PRN
  Administered 2021-09-21: 4 mg via INTRAVENOUS

## 2021-09-21 MED ORDER — SCOPOLAMINE 1 MG/3DAYS TD PT72: 1.0000 | MEDICATED_PATCH | Freq: Once | TRANSDERMAL | Status: DC

## 2021-09-21 MED ORDER — DIPHENHYDRAMINE HCL 50 MG/ML IJ SOLN
12.5000 mg | INTRAMUSCULAR | Status: DC | PRN
  Administered 2021-09-21: 12.5 mg via INTRAVENOUS
  Filled 2021-09-21: qty 1

## 2021-09-21 MED ORDER — KETOROLAC TROMETHAMINE 30 MG/ML IJ SOLN
30.0000 mg | Freq: Four times a day (QID) | INTRAMUSCULAR | Status: AC | PRN
  Administered 2021-09-21: 30 mg via INTRAMUSCULAR

## 2021-09-21 MED ORDER — POVIDONE-IODINE 10 % EX SWAB
Freq: Once | CUTANEOUS | Status: DC
Start: 2021-09-21 — End: 2021-09-21

## 2021-09-21 MED ORDER — KETOROLAC TROMETHAMINE 30 MG/ML IJ SOLN
INTRAMUSCULAR | Status: AC
  Filled 2021-09-21: qty 1

## 2021-09-21 MED ORDER — TETANUS-DIPHTH-ACELL PERTUSSIS 5-2.5-18.5 LF-MCG/0.5 IM SUSY: 0.5000 mL | PREFILLED_SYRINGE | Freq: Once | INTRAMUSCULAR | Status: DC

## 2021-09-21 MED ORDER — ONDANSETRON HCL 4 MG/2ML IJ SOLN: 4.0000 mg | Freq: Three times a day (TID) | INTRAMUSCULAR | Status: DC | PRN

## 2021-09-21 MED ORDER — OXYTOCIN-SODIUM CHLORIDE 30-0.9 UT/500ML-% IV SOLN
INTRAVENOUS | Status: DC | PRN
  Administered 2021-09-21: 300 mL via INTRAVENOUS

## 2021-09-21 MED ORDER — TRANEXAMIC ACID-NACL 1000-0.7 MG/100ML-% IV SOLN
INTRAVENOUS | Status: AC
  Filled 2021-09-21: qty 100

## 2021-09-21 MED ORDER — MORPHINE SULFATE (PF) 0.5 MG/ML IJ SOLN
INTRAMUSCULAR | Status: DC | PRN
  Administered 2021-09-21: 150 ug via INTRATHECAL

## 2021-09-21 MED ORDER — SODIUM CHLORIDE 0.9% FLUSH: 3.0000 mL | INTRAVENOUS | Status: DC | PRN

## 2021-09-21 MED ORDER — OXYTOCIN-SODIUM CHLORIDE 30-0.9 UT/500ML-% IV SOLN: 2.5000 [IU]/h | INTRAVENOUS | Status: AC

## 2021-09-21 MED ORDER — SCOPOLAMINE 1 MG/3DAYS TD PT72
MEDICATED_PATCH | TRANSDERMAL | Status: AC
  Filled 2021-09-21: qty 1

## 2021-09-21 MED ORDER — RHO D IMMUNE GLOBULIN 1500 UNIT/2ML IJ SOSY
300.0000 ug | PREFILLED_SYRINGE | Freq: Once | INTRAMUSCULAR | Status: AC
  Administered 2021-09-22: 300 ug via INTRAVENOUS
  Filled 2021-09-21: qty 2

## 2021-09-21 MED ORDER — BUPIVACAINE IN DEXTROSE 0.75-8.25 % IT SOLN
INTRATHECAL | Status: DC | PRN
  Administered 2021-09-21: 1.5 mL via INTRATHECAL

## 2021-09-21 MED ORDER — FENTANYL CITRATE (PF) 100 MCG/2ML IJ SOLN
INTRAMUSCULAR | Status: AC
  Filled 2021-09-21: qty 2

## 2021-09-21 MED ORDER — BUPIVACAINE HCL 0.5 % IJ SOLN
INTRAMUSCULAR | Status: DC | PRN
  Administered 2021-09-21: 30 mL

## 2021-09-21 MED ORDER — CEFAZOLIN SODIUM-DEXTROSE 2-4 GM/100ML-% IV SOLN
INTRAVENOUS | Status: AC
  Filled 2021-09-21: qty 100

## 2021-09-21 MED ORDER — CEFAZOLIN SODIUM-DEXTROSE 2-3 GM-%(50ML) IV SOLR
INTRAVENOUS | Status: DC | PRN
  Administered 2021-09-21: 2 g via INTRAVENOUS

## 2021-09-21 MED ORDER — DIPHENHYDRAMINE HCL 50 MG/ML IJ SOLN: 12.5000 mg | INTRAMUSCULAR | Status: DC | PRN

## 2021-09-21 MED ORDER — MORPHINE SULFATE (PF) 0.5 MG/ML IJ SOLN
INTRAMUSCULAR | Status: AC
  Filled 2021-09-21: qty 10

## 2021-09-21 MED ORDER — MEPERIDINE HCL 25 MG/ML IJ SOLN: 6.2500 mg | INTRAMUSCULAR | Status: DC | PRN

## 2021-09-21 MED ORDER — NALOXONE HCL 4 MG/10ML IJ SOLN: 1.0000 ug/kg/h | INTRAVENOUS | Status: DC | PRN

## 2021-09-21 MED ORDER — SCOPOLAMINE 1 MG/3DAYS TD PT72
1.0000 | MEDICATED_PATCH | Freq: Once | TRANSDERMAL | Status: DC
  Administered 2021-09-21: 1.5 mg via TRANSDERMAL

## 2021-09-21 MED ORDER — TRANEXAMIC ACID-NACL 1000-0.7 MG/100ML-% IV SOLN
INTRAVENOUS | Status: DC | PRN
  Administered 2021-09-21: 1000 mg via INTRAVENOUS

## 2021-09-21 MED ORDER — DIPHENHYDRAMINE HCL 25 MG PO CAPS: 25.0000 mg | ORAL_CAPSULE | ORAL | Status: DC | PRN

## 2021-09-21 MED ORDER — LACTATED RINGERS IV SOLN: INTRAVENOUS | Status: DC

## 2021-09-21 MED ORDER — NALOXONE HCL 0.4 MG/ML IJ SOLN: 0.4000 mg | INTRAMUSCULAR | Status: DC | PRN

## 2021-09-21 MED ORDER — PHENYLEPHRINE HCL-NACL 20-0.9 MG/250ML-% IV SOLN
INTRAVENOUS | Status: DC | PRN
  Administered 2021-09-21: 60 ug/min via INTRAVENOUS

## 2021-09-21 MED ORDER — IBUPROFEN 600 MG PO TABS
600.0000 mg | ORAL_TABLET | Freq: Four times a day (QID) | ORAL | Status: DC
  Administered 2021-09-22 – 2021-09-23 (×3): 600 mg via ORAL
  Filled 2021-09-21 (×3): qty 1

## 2021-09-21 MED ORDER — ACETAMINOPHEN 500 MG PO TABS
1000.0000 mg | ORAL_TABLET | Freq: Four times a day (QID) | ORAL | Status: DC
  Administered 2021-09-21 – 2021-09-23 (×7): 1000 mg via ORAL
  Filled 2021-09-21 (×7): qty 2

## 2021-09-21 MED ORDER — NALBUPHINE HCL 10 MG/ML IJ SOLN
10.0000 mg | Freq: Once | INTRAMUSCULAR | Status: AC
  Administered 2021-09-22: 10 mg via INTRAVENOUS
  Filled 2021-09-21: qty 1

## 2021-09-21 MED ORDER — ACETAMINOPHEN 10 MG/ML IV SOLN
1000.0000 mg | Freq: Four times a day (QID) | INTRAVENOUS | Status: AC
  Administered 2021-09-21: 1000 mg via INTRAVENOUS
  Filled 2021-09-21 (×3): qty 100

## 2021-09-21 MED ORDER — OXYCODONE HCL 5 MG PO TABS: 5.0000 mg | ORAL_TABLET | Freq: Once | ORAL | Status: DC | PRN

## 2021-09-21 MED ORDER — OXYCODONE HCL 5 MG/5ML PO SOLN: 5.0000 mg | Freq: Once | ORAL | Status: DC | PRN

## 2021-09-21 MED ORDER — ZOLPIDEM TARTRATE 5 MG PO TABS: 5.0000 mg | ORAL_TABLET | Freq: Every evening | ORAL | Status: DC | PRN

## 2021-09-21 MED ORDER — DIPHENHYDRAMINE HCL 25 MG PO CAPS: 25.0000 mg | ORAL_CAPSULE | Freq: Four times a day (QID) | ORAL | Status: DC | PRN

## 2021-09-21 MED ORDER — OXYTOCIN-SODIUM CHLORIDE 30-0.9 UT/500ML-% IV SOLN
INTRAVENOUS | Status: AC
  Filled 2021-09-21: qty 500

## 2021-09-21 MED ORDER — KETOROLAC TROMETHAMINE 30 MG/ML IJ SOLN
30.0000 mg | Freq: Four times a day (QID) | INTRAMUSCULAR | Status: AC
  Administered 2021-09-21 – 2021-09-22 (×4): 30 mg via INTRAVENOUS
  Filled 2021-09-21 (×4): qty 1

## 2021-09-21 MED ORDER — FENTANYL CITRATE (PF) 100 MCG/2ML IJ SOLN: 25.0000 ug | INTRAMUSCULAR | Status: DC | PRN

## 2021-09-21 MED ORDER — STERILE WATER FOR IRRIGATION IR SOLN
Status: DC | PRN
  Administered 2021-09-21: 1

## 2021-09-21 MED ORDER — OXYCODONE HCL 5 MG PO TABS: 5.0000 mg | ORAL_TABLET | ORAL | Status: DC | PRN

## 2021-09-21 MED ORDER — HYDROMORPHONE HCL 1 MG/ML IJ SOLN: 0.2000 mg | INTRAMUSCULAR | Status: DC | PRN

## 2021-09-21 MED ORDER — ACETAMINOPHEN 160 MG/5ML PO SOLN: 325.0000 mg | ORAL | Status: DC | PRN

## 2021-09-21 MED ORDER — COCONUT OIL OIL: 1.0000 | TOPICAL_OIL | Status: DC | PRN

## 2021-09-21 MED ORDER — SENNOSIDES-DOCUSATE SODIUM 8.6-50 MG PO TABS
2.0000 | ORAL_TABLET | ORAL | Status: DC
  Administered 2021-09-21 – 2021-09-23 (×2): 2 via ORAL
  Filled 2021-09-21 (×2): qty 2

## 2021-09-21 MED ORDER — ONDANSETRON HCL 4 MG/2ML IJ SOLN
INTRAMUSCULAR | Status: AC
  Filled 2021-09-21: qty 2

## 2021-09-21 SURGICAL SUPPLY — 43 items
APL PRP STRL LF DISP 70% ISPRP (MISCELLANEOUS) ×2
APL SKNCLS STERI-STRIP NONHPOA (GAUZE/BANDAGES/DRESSINGS) ×1
BARRIER ADHS 3X4 INTERCEED (GAUZE/BANDAGES/DRESSINGS) ×1 IMPLANT
BENZOIN TINCTURE PRP APPL 2/3 (GAUZE/BANDAGES/DRESSINGS) ×2 IMPLANT
BRR ADH 4X3 ABS CNTRL BYND (GAUZE/BANDAGES/DRESSINGS) ×1
CHLORAPREP W/TINT 26 (MISCELLANEOUS) ×4 IMPLANT
CLAMP CORD UMBIL (MISCELLANEOUS) ×2 IMPLANT
CLIP FILSHIE TUBAL LIGA STRL (Clip) ×1 IMPLANT
CLOTH BEACON ORANGE TIMEOUT ST (SAFETY) ×2 IMPLANT
DRSG OPSITE POSTOP 4X10 (GAUZE/BANDAGES/DRESSINGS) ×2 IMPLANT
ELECT REM PT RETURN 9FT ADLT (ELECTROSURGICAL) ×2
ELECTRODE REM PT RTRN 9FT ADLT (ELECTROSURGICAL) ×1 IMPLANT
GAUZE SPONGE 4X4 12PLY STRL LF (GAUZE/BANDAGES/DRESSINGS) ×2 IMPLANT
GLOVE BIO SURGEON STRL SZ 6.5 (GLOVE) ×2 IMPLANT
GLOVE BIOGEL PI IND STRL 7.0 (GLOVE) ×2 IMPLANT
GLOVE BIOGEL PI INDICATOR 7.0 (GLOVE) ×2
GOWN STRL REUS W/TWL LRG LVL3 (GOWN DISPOSABLE) ×6 IMPLANT
HEMOSTAT ARISTA ABSORB 3G PWDR (HEMOSTASIS) ×2 IMPLANT
KIT ABG SYR 3ML LUER SLIP (SYRINGE) IMPLANT
NDL HYPO 25X5/8 SAFETYGLIDE (NEEDLE) IMPLANT
NEEDLE HYPO 22GX1.5 SAFETY (NEEDLE) ×2 IMPLANT
NEEDLE HYPO 25X5/8 SAFETYGLIDE (NEEDLE) IMPLANT
NS IRRIG 1000ML POUR BTL (IV SOLUTION) ×2 IMPLANT
PACK C SECTION WH (CUSTOM PROCEDURE TRAY) ×2 IMPLANT
PAD ABD 7.5X8 STRL (GAUZE/BANDAGES/DRESSINGS) ×1 IMPLANT
PAD ABD DERMACEA PRESS 5X9 (GAUZE/BANDAGES/DRESSINGS) ×1 IMPLANT
PAD OB MATERNITY 4.3X12.25 (PERSONAL CARE ITEMS) ×2 IMPLANT
RTRCTR C-SECT PINK 25CM LRG (MISCELLANEOUS) ×2 IMPLANT
STRIP CLOSURE SKIN 1/2X4 (GAUZE/BANDAGES/DRESSINGS) ×2 IMPLANT
STRIP SURGICAL 1/4 X 6 IN (GAUZE/BANDAGES/DRESSINGS) ×1 IMPLANT
SUT CHROMIC 2 0 CT 1 (SUTURE) ×5 IMPLANT
SUT MNCRL 0 VIOLET CTX 36 (SUTURE) ×2 IMPLANT
SUT MONOCRYL 0 CTX 36 (SUTURE) ×4
SUT PDS AB 0 CTX 60 (SUTURE) ×3 IMPLANT
SUT PLAIN 2 0 (SUTURE)
SUT PLAIN ABS 2-0 CT1 27XMFL (SUTURE) IMPLANT
SUT VIC AB 0 CTX 36 (SUTURE) ×4
SUT VIC AB 0 CTX36XBRD ANBCTRL (SUTURE) ×2 IMPLANT
SUT VIC AB 4-0 KS 27 (SUTURE) ×2 IMPLANT
SYR CONTROL 10ML LL (SYRINGE) ×2 IMPLANT
TOWEL OR 17X24 6PK STRL BLUE (TOWEL DISPOSABLE) ×2 IMPLANT
TRAY FOLEY W/BAG SLVR 14FR LF (SET/KITS/TRAYS/PACK) ×2 IMPLANT
WATER STERILE IRR 1000ML POUR (IV SOLUTION) ×2 IMPLANT

## 2021-09-21 NOTE — Lactation Note (Signed)
This note was copied from a baby's chart. Lactation Consultation Note  Patient Name: Stephanie Mack [id number] Date: 09/21/2021   Age:29 hours  Per RN, Tilda Burrow, patient declines lactation services. She does not wish to pump or breastfeed at this time. Her feeding plan indicates formula.   Consult Status Consult Status: Complete (Formula)    Lenore Manner 09/21/2021, 2:43 PM

## 2021-09-21 NOTE — Transfer of Care (Signed)
Immediate Anesthesia Transfer of Care Note  Patient: Stephanie Mack  Procedure(s) Performed: CESAREAN SECTION WITH BILATERAL TUBAL LIGATION  Patient Location: PACU  Anesthesia Type:Spinal  Level of Consciousness: awake  Airway & Oxygen Therapy: Patient Spontanous Breathing  Post-op Assessment: Report given to RN and Post -op Vital signs reviewed and stable  Post vital signs: Reviewed and stable  Last Vitals:  Vitals Value Taken Time  BP 139/89 09/21/21 1145  Temp    Pulse 87 09/21/21 1146  Resp 14 09/21/21 1146  SpO2 95 % 09/21/21 1146  Vitals shown include unvalidated device data.  Last Pain:  Vitals:   09/21/21 0820  TempSrc: Oral         Complications: No notable events documented.
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
## 2021-09-21 NOTE — Anesthesia Procedure Notes (Addendum)
Spinal  Patient location during procedure: OR Start time: 09/21/2021 10:00 AM End time: 09/21/2021 10:04 AM Reason for block: surgical anesthesia Staffing Anesthesiologist: Janeece Riggers, MD Performed by: Janeece Riggers, MD Authorized by: Janeece Riggers, MD   Preanesthetic Checklist Completed: patient identified, IV checked, site marked, risks and benefits discussed, surgical consent, monitors and equipment checked, pre-op evaluation and timeout performed Spinal Block Patient position: sitting Prep: DuraPrep Patient monitoring: heart rate, cardiac monitor, continuous pulse ox and blood pressure Approach: midline Location: L3-4 Injection technique: single-shot Needle Needle type: Sprotte  Needle gauge: 24 G Needle length: 9 cm Assessment Sensory level: T4 Events: CSF return

## 2021-09-21 NOTE — Anesthesia Preprocedure Evaluation (Addendum)
Anesthesia Evaluation  Patient identified by MRN, date of birth, ID band Patient awake    Reviewed: Allergy & Precautions, H&P , NPO status , Patient's Chart, lab work & pertinent test results  Airway Mallampati: I  TM Distance: >3 FB Neck ROM: full    Dental no notable dental hx. (+) Teeth Intact, Dental Advisory Given   Pulmonary neg pulmonary ROS,    Pulmonary exam normal        Cardiovascular hypertension, Pt. on medications Normal cardiovascular exam     Neuro/Psych  Headaches, PSYCHIATRIC DISORDERS Anxiety Depression Bipolar Disorder    GI/Hepatic negative GI ROS, Neg liver ROS,   Endo/Other  diabetes, Type 2  Renal/GU negative Renal ROS     Musculoskeletal   Abdominal Normal abdominal exam  (+)   Peds  Hematology  (+) Blood dyscrasia, anemia ,   Anesthesia Other Findings   Reproductive/Obstetrics (+) Pregnancy                             Anesthesia Physical  Anesthesia Plan  ASA: 3  Anesthesia Plan: Spinal   Post-op Pain Management: Minimal or no pain anticipated   Induction:   PONV Risk Score and Plan: 2 and Scopolamine patch - Pre-op and Treatment may vary due to age or medical condition  Airway Management Planned: Natural Airway  Additional Equipment: None  Intra-op Plan:   Post-operative Plan:   Informed Consent: I have reviewed the patients History and Physical, chart, labs and discussed the procedure including the risks, benefits and alternatives for the proposed anesthesia with the patient or authorized representative who has indicated his/her understanding and acceptance.     Dental Advisory Given  Plan Discussed with: CRNA and Anesthesiologist  Anesthesia Plan Comments: (  )        Anesthesia Quick Evaluation

## 2021-09-21 NOTE — Transfer of Care (Deleted)
Immediate Anesthesia Transfer of Care Note  Patient: Stephanie Mack  Procedure(s) Performed: CESAREAN SECTION WITH BILATERAL TUBAL LIGATION  Patient Location: PACU  Anesthesia Type:Spinal  Level of Consciousness: awake, alert  and oriented  Airway & Oxygen Therapy: Patient Spontanous Breathing  Post-op Assessment: Report given to RN and Post -op Vital signs reviewed and stable  Post vital signs: Reviewed and stable  Last Vitals:  Vitals Value Taken Time  BP    Temp    Pulse    Resp    SpO2      Last Pain:  Vitals:   09/21/21 0820  TempSrc: Oral         Complications: No notable events documented.

## 2021-09-21 NOTE — Op Note (Signed)
Cesarean Section Procedure Note  Indications: 31 yo [id number] at 36 weeks presents for scheduled repeat cesarean section. Patient with history of 25 week classical cesarean section   Pre-operative Diagnosis:  36 week 0 day pregnancy.  Prior 1 classical cesarean section and one Low transverse cesarean section Desires permanent sterilization  Post-operative Diagnosis: same  Surgeon: Sanjuana Kava MD  Assistants: Crawford Givens, MD  Anesthesia: combined spinal epidural  ASA Class: 2   Procedure Details   The patient was seen in the Holding Room. The risks, benefits, complications, treatment options, and expected outcomes were discussed with the patient.  The patient concurred with the proposed plan, giving informed consent.  The site of surgery properly noted/marked. The patient was taken to Operating Room # A, identified as Stephanie Mack and the procedure verified as C-Section Delivery. A Time Out was held and the above information confirmed.  After spinal was found to adequate , the patient was placed in the dorsal supine position with a leftward tilt, draped and prepped in the usual sterile manner. A Pfannenstiel incision was made and carried down through the subcutaneous tissue to the fascia.  The fascia was incised in the midline and the fascial incision was extended transversely with Mayo scissors. The superior aspect of the fascial incision was grasped with Coker clamps x2, tented up and the rectus muscles dissected off sharply with the scalpel. The rectus was then dissected off with blunt dissection and Mayo scissors inferiorly. The rectus muscles were separated in the midline. The abdominal peritoneum was identified, and entered bluntly using surgeon fingers, and extended with gentle pulling.  The Alexis retractor was then deployed. Scalpel was then used to make a low transverse incision on the uterus which was extended laterally with  blunt dissection.  The fetal vertex was identified,  the head was brought out from the pelvis. The head was then delivered easily through the uterine incision with fundal pressure given by assistant followed by the body.    Delivered from cephalic presentation was OP a 2920 gram Female with Apgar scores of 7 at one minute and 9 at five minutes. After the umbilical cord was clamped and cut cord blood was obtained for evaluation. infant was bulb suctioned on the operative field cried vigorously, cord was quickly double clamped and cut and the infant was passed to the waiting neonatologist. Apgars 7/9. Cord blood obtained for evaluation. Placenta was then delivered spontaneously, intact and appeared normal, the uterus was cleared of all clot and debris. The uterine incision was repaired with #0 Monocryl in running locked fashion. A second imbricating suture was performed using the same suture. There was oozing along the right anterior broad ligament and at the midline of the incision. 2-0 chromic was utilized to repair any defect. Interceed placed over the incision and Arista placed in broad ligament. The uterine outline, tubes and ovaries appeared normal.   The fallopian tube on the right side was located with the surgeon's fingers through the incision and the left fallopian tube was swept into view so that the midportion of the tube could be grasped with Babcock clamps and delivered. As the loop was held up, the fallopian tube was traced down to the fimbriated end to ensure that this was the fallopian tube. From the fimbriated end of the tube the mesosalpinx was sequentially clamped, cauterized and transected with the Ligasure Impact. The tubal segment was handed off to be sent to Pathology. The ligated tubal stump was noted to be hemostatic. The  process was repeated on the opposite side, grasping the midportion of the left fallopian tube, visualizing the fimbriated end and sequentially clamping, cauterizing and cutting the tube off the uterus with the Ligasure  Impact. TheThe Alexis retractor was removed. The abdominal cavity was irrigate with sterile normal saline and cleared of all clot and debris. The abdominal peritoneum was reapproximated with 2-0 chromic  in a running fashion, the rectus muscles was reapproximated with same suture. The fascia was closed with 0 looped PDS in a running fashion. The subcuticular layer was irrigated and all bleeders cauterized.     The incision was injected with 30 mL of 0.5% Marcaine. The skin was closed with 4-0 vicryl in a subcuticular fashion using a Lanny Hurst needle. The incision was dressed with benzoine, steri strips, honeycomb and pressure dressing. All sponge lap and needle counts were correct x3. Patient tolerated the procedure well and recovered in stable condition following the procedure.   Findings: Live female infant 2920 grams APGARS 7/9; clear amniotic fluid; Placenta appears mottled with possible calcifications; 3VC; normal appearing uterus, ovaries, and bilateral fallopian tubes  Estimated Blood Loss:  984 mL         Drains:  Foley catheter  Urine output: 650 mL clear urine         Total IV Fluids:  3000 mL         Specimens: Fallopian Tube, Right, and Left         Implants: None         Complications:  None; patient tolerated the procedure well.         Disposition: PACU - hemodynamically stable.         Condition: stable  Attending Attestation: I was present and scrubbed for the entire procedure and an assistant was necessary due to the complexity of the procedure  Sanjuana Kava, MD

## 2021-09-22 ENCOUNTER — Other Ambulatory Visit: Payer: Medicaid Other

## 2021-09-22 ENCOUNTER — Ambulatory Visit: Payer: Self-pay

## 2021-09-22 LAB — CBC
HCT: 26.9 % — ABNORMAL LOW (ref 36.0–46.0)
Hemoglobin: 8.9 g/dL — ABNORMAL LOW (ref 12.0–15.0)
MCH: 25.9 pg — ABNORMAL LOW (ref 26.0–34.0)
MCHC: 33.1 g/dL (ref 30.0–36.0)
MCV: 78.2 fL — ABNORMAL LOW (ref 80.0–100.0)
Platelets: 194 10*3/uL (ref 150–400)
RBC: 3.44 MIL/uL — ABNORMAL LOW (ref 3.87–5.11)
RDW: 14.6 % (ref 11.5–15.5)
WBC: 13.2 10*3/uL — ABNORMAL HIGH (ref 4.0–10.5)
nRBC: 0 % (ref 0.0–0.2)

## 2021-09-22 MED ORDER — NIFEDIPINE ER OSMOTIC RELEASE 30 MG PO TB24
30.0000 mg | ORAL_TABLET | Freq: Every day | ORAL | Status: DC
  Administered 2021-09-22 – 2021-09-23 (×2): 30 mg via ORAL
  Filled 2021-09-22 (×2): qty 1

## 2021-09-22 MED ORDER — METFORMIN HCL ER 500 MG PO TB24
1000.0000 mg | ORAL_TABLET | Freq: Two times a day (BID) | ORAL | Status: DC
  Administered 2021-09-22 – 2021-09-23 (×2): 1000 mg via ORAL
  Filled 2021-09-22 (×3): qty 2

## 2021-09-22 MED ORDER — SERTRALINE HCL 50 MG PO TABS
50.0000 mg | ORAL_TABLET | Freq: Every day | ORAL | Status: DC
  Administered 2021-09-22 – 2021-09-23 (×2): 50 mg via ORAL
  Filled 2021-09-22 (×2): qty 1

## 2021-09-22 NOTE — Anesthesia Postprocedure Evaluation (Signed)
Anesthesia Post Note  Patient: Stephanie Mack  Procedure(s) Performed: CESAREAN SECTION WITH BILATERAL TUBAL LIGATION     Patient location during evaluation: Mother Baby Anesthesia Type: Spinal Level of consciousness: oriented and awake and alert Pain management: pain level controlled Vital Signs Assessment: post-procedure vital signs reviewed and stable Respiratory status: spontaneous breathing and respiratory function stable Cardiovascular status: blood pressure returned to baseline and stable Postop Assessment: no headache, no backache, no apparent nausea or vomiting and able to ambulate Anesthetic complications: no   No notable events documented.  Last Vitals:  Vitals:   09/22/21 0800 09/22/21 1216  BP: 131/77 123/78  Pulse: 88 91  Resp: 19 17  Temp: 36.5 C 36.5 C  SpO2: 96% 100%    Last Pain:  Vitals:   09/22/21 1242  TempSrc:   PainSc: 5                  Chanteria Haggard

## 2021-09-22 NOTE — Progress Notes (Signed)
Subjective: POD# 1 Information for the patient's newborn:  Stephanie Mack, Stephanie Mack [[id number]]  female    Circumcision in-patient  Reports feeling good, just itchy Feeding: formula Reports tolerating PO and denies N/V, foley removed, ambulating and urinating w/o difficulty  Pain controlled with  PO meds Denies HA/SOB/dizziness  Flatus not passing Vaginal bleeding is normal, no clots     Objective:  VS:  Vitals:   09/21/21 1831 09/21/21 2056 09/22/21 0006 09/22/21 0431  BP: (!) 146/86 128/78 134/77 (!) 114/57  Pulse: (!) 123 77 87 88  Resp:  '18 17 17  '$ Temp:  98 F (36.7 C) 98 F (36.7 C) 98.1 F (36.7 C)  TempSrc:  Oral Oral Oral  SpO2:  96%  94%  Weight:      Height:        Intake/Output Summary (Last 24 hours) at 09/22/2021 0076 Last data filed at 09/21/2021 2252 Gross per 24 hour  Intake 3390 ml  Output 2209 ml  Net 1181 ml     Recent Labs    09/20/21 0940 09/22/21 0451  WBC 8.6 13.2*  HGB 10.0* 8.9*  HCT 31.4* 26.9*  PLT 195 194    Blood type: --/--/B NEG (06/19 0941) Rubella: Immune (12/28 0000)    Physical Exam:  General: alert, cooperative, and no distress CV: Regular rate and rhythm Resp: clear Abdomen: soft, non-tender, hypoactive bowel sounds x4 Incision:  pressure dressing removed, honeycomb dressing CDI Perineum:  Uterine Fundus: firm, below umbilicus, nontender Lochia:  appropriate Ext:  neg for edema, pain, tenderness, and cords   Assessment/Plan: 31 y.o.   POD# 1. [id number]                  Principal Problem:   History of classical cesarean section Active Problems:   Status post repeat low transverse cesarean section   Routine post-op PP care          Advance diet as tolerated Nubain for itching Advised warm fluids and ambulation to improve GI motility Breastfeeding support Anticipate D/C on post-op day 2 or [place], DNP, CNM 09/22/2021, 6:24 AM

## 2021-09-23 ENCOUNTER — Other Ambulatory Visit (HOSPITAL_COMMUNITY): Payer: Self-pay

## 2021-09-23 LAB — RH IG WORKUP (INCLUDES ABO/RH)
Fetal Screen: NEGATIVE
Gestational Age(Wks): 36
Unit division: 0

## 2021-09-23 LAB — SURGICAL PATHOLOGY

## 2021-09-23 MED ORDER — OXYCODONE-ACETAMINOPHEN 5-325 MG PO TABS
1.0000 | ORAL_TABLET | ORAL | 0 refills | Status: DC | PRN
  Filled 2021-09-23: qty 30, 3d supply, fill #0

## 2021-09-23 MED ORDER — IBUPROFEN 600 MG PO TABS
600.0000 mg | ORAL_TABLET | Freq: Four times a day (QID) | ORAL | 3 refills | Status: DC | PRN
  Filled 2021-09-23: qty 60, 15d supply, fill #0

## 2021-09-23 MED ORDER — SENNOSIDES-DOCUSATE SODIUM 8.6-50 MG PO TABS
2.0000 | ORAL_TABLET | Freq: Two times a day (BID) | ORAL | 3 refills | Status: DC | PRN
  Filled 2021-09-23: qty 60, 15d supply, fill #0

## 2021-09-23 NOTE — Clinical Social Work Maternal (Signed)
CLINICAL SOCIAL WORK MATERNAL/CHILD NOTE  Patient Details  Name: Stephanie Mack MRN: [id number] Date of Birth: [birth year]  Date:  09/23/2021  Clinical Social Worker Initiating Note:  Abundio Miu,  Date/Time: Initiated:  09/23/21/1126     Child's Name:  Gibson Ramp   Biological Parents:  Mother, Father (Father: Andree Coss)   Need for Interpreter:  None   Reason for Referral:  Behavioral Health Concerns, Other (Comment) (Infant's NICU Admission)   Address:  [address] [place] [address]    Phone number:  [phone]   Additional phone number:   Household Members/Support Persons (HM/SP):   Household Member/Support Person 1, Household Member/Support Person 2   HM/SP Name Relationship DOB or Age  HM/SP -1   grandmother    HM/SP -2 Versie Starks daughter 01/07/15  HM/SP -3        HM/SP -4        HM/SP -5        HM/SP -6        HM/SP -7        HM/SP -8          Natural Supports (not living in the home):      Professional Supports: None   Employment: Unemployed   Type of Work:     Education:  Programmer, systems   Homebound arranged:    Museum/gallery curator Resources:  [employer]   Other Resources:  [employer], Physicist, medical     Cultural/Religious Considerations Which May Impact Care:    Strengths:  Ability to meet basic needs  , Psychotropic Medications, Pediatrician chosen, Home prepared for child  , Understanding of illness   Psychotropic Medications:  Zoloft      Pediatrician:    [place]  Pediatrician List:   [facility] ([address])  [place]      Pediatrician Fax Number:    Risk Factors/Current Problems:  Mental Health Concerns     Cognitive State:  Able to Concentrate  , Alert  , Linear Thinking  , Goal Oriented     Mood/Affect:  Calm  , Interested  , Comfortable     CSW Assessment: CSW met with MOB  at infant's bedside to complete psychosocial assessment. MOB was sitting in recliner and holding infant. CSW introduced self and explained role. MOB was pleasant and remained engaged during assessment. MOB reported that she resides with her grandmother and daughter. MOB reported that she receives food stamps and is open to a [facility] referral. CSW agreed to complete [facility] referral. MOB reported that she has all items needed to care for infant including a car seat and a basinet that will be delivered today. CSW inquired about MOB's support system, MOB reported that her grandmother is a support.   CSW inquired about MOB's mental health history. MOB reported that she was diagnosed with Schizophrenia and Bipolar Disorder at age 31. CSW inquired about any auditory or visual hallucinations, MOB denied AVH. CSW inquired about MOB's symptoms that led to diagnosis. MOB shared that she would get in a mood and isolate, MOB denied any additional symptoms. MOB reported that she is currently taking Zoloft which has been helpful. MOB verbalized a plan to continue taking Zoloft. MOB denied taking any additional medication in the past to treat her mental health diagnoses. MOB reported that she has not participated in  therapy and denied needing any resources. MOB reported that she has been diagnosed with depression but unable to recall when. MOB denied any current depressive symptoms. MOB reported that she experienced postpartum depression after having her daughter. MOB described crying out of no where and being sensitive as her symptoms, noting it lasted for a few weeks. CSW informed about baby blues and explored how MOB may have been experiencing baby blues during that time. MOB reported that the symptoms subsided without treatment. MOB denied any additional mental health history. Per chart review, MOB has a history of depression, anxiety, Bipolar 1 Disorder, and Bipolar affective Disorder, depressed, severe. CSW inquired about how MOB  was feeling emotionally since giving birth, MOB reported that she was feeling good. MOB presented calm and did not demonstrate any acute mental health signs/symptoms. CSW assessed for safety, MOB denied SI,HI, and domestic violence.   CSW provided education regarding the baby blues period vs. perinatal mood disorders, discussed treatment and gave resources for mental health follow up if concerns arise.  CSW recommends self-evaluation during the postpartum time period using the New Mom Checklist from Postpartum Progress and encouraged MOB to contact a medical professional if symptoms are noted at any time.    CSW provided review of Sudden Infant Death Syndrome (SIDS) precautions.    CSW and MOB discussed infant's NICU admission. MOB reported that she feels well informed about infant's care and shared that infant will be discharged today. MOB denied any questions/concerns regarding the NICU.   CSW identifies no further need for intervention and no barriers to discharge at this time.  CSW completed [facility] referral.    CSW Plan/Description:  No Further Intervention Required/No Barriers to Discharge, Sudden Infant Death Syndrome (SIDS) Education, Perinatal Mood and Anxiety Disorder (PMADs) Education    Burnis Medin, LCSW 09/23/2021, 11:29 AM

## 2021-09-23 NOTE — Progress Notes (Signed)
   09/23/21 1549  Departure Condition  Departure Condition Good  Mobility at [facility]  Patient/Caregiver Teaching Teach Back Method Used;Discharge instructions reviewed;Prescriptions reviewed;Follow-up care reviewed;Pain management discussed;Medications discussed;Educated about hypertension in pregnancy  Departure Mode By self  Was procedural sedation performed on this patient during this visit? No   Patient alert and oriented x4, and VS and pain stable.

## 2021-09-23 NOTE — Progress Notes (Signed)
Subjective: POD# 2 Information for the patient's newborn:  Stephanie Mack, Stephanie Mack [[id number]]  female   Circumcision desires in-pt circ after infant is discharged from NICU  Reports feeling good Feeding: formula Reports tolerating PO and denies N/V, foley removed, ambulating and urinating w/o difficulty  Pain controlled with  PO meds are effective for pain relief Denies HA/SOB/dizziness  Flatus passing Vaginal bleeding is normal, no clots     Objective:  VS:  Vitals:   09/22/21 0800 09/22/21 1216 09/22/21 1951 09/23/21 0343  BP: 131/77 123/78 129/79 140/74  Pulse: 88 91 83 80  Resp: '19 17 16 16  '$ Temp: 97.7 F (36.5 C) 97.7 F (36.5 C) 98.2 F (36.8 C) 98.3 F (36.8 C)  TempSrc: Oral Oral Oral Oral  SpO2: 96% 100% 100% 100%  Weight:      Height:        Intake/Output Summary (Last 24 hours) at 09/23/2021 0650 Last data filed at 09/22/2021 0800 Gross per 24 hour  Intake 0 ml  Output --  Net 0 ml     Recent Labs    09/20/21 0940 09/22/21 0451  WBC 8.6 13.2*  HGB 10.0* 8.9*  HCT 31.4* 26.9*  PLT 195 194    Blood type: --/--/B NEG (06/19 0941) Rubella: Immune (12/28 0000)    Physical Exam:  General: alert, cooperative, and no distress Resp: unlabored Abdomen: soft, nontender, normal bowel sounds Incision: honey comb dressing clean, dry, and intact Perineum:  Uterine Fundus: firm, below umbilicus, nontender Lochia: minimal Ext: no edema, redness or tenderness in the calves or thighs   Assessment/Plan: 31 y.o.   POD# 2. [id number]                  Principal Problem:   History of classical cesarean section Active Problems:   Essential hypertension, benign   ADHD (attention deficit hyperactivity disorder)   Bipolar 1 disorder (HCC)   Type 2 diabetes mellitus ([place])   Status post repeat low transverse cesarean section   Continue routine post-op PP care   Continue Metformin 1000 mg PO BID Continue Procardia XL 30 mg PO daily Infant in NICU and is anticipated to  be DC'd tomorrow         Anticipate D/C 09/24/21  Arrie Eastern, DNP, CNM 09/23/2021, 6:50 AM

## 2021-09-23 NOTE — Plan of Care (Signed)
  Problem: Education: Goal: Knowledge of General Education information will improve Description: Including pain rating scale, medication(s)/side effects and non-pharmacologic comfort measures Outcome: Adequate for Discharge   Problem: Health Behavior/Discharge Planning: Goal: Ability to manage health-related needs will improve Outcome: Adequate for Discharge   Problem: Clinical Measurements: Goal: Ability to maintain clinical measurements within normal limits will improve Outcome: Adequate for Discharge Goal: Will remain free from infection Outcome: Adequate for Discharge Goal: Diagnostic test results will improve Outcome: Adequate for Discharge Goal: Respiratory complications will improve Outcome: Adequate for Discharge Goal: Cardiovascular complication will be avoided Outcome: Adequate for Discharge   Problem: Activity: Goal: Risk for activity intolerance will decrease Outcome: Adequate for Discharge   Problem: Nutrition: Goal: Adequate nutrition will be maintained Outcome: Adequate for Discharge   Problem: Coping: Goal: Level of anxiety will decrease Outcome: Adequate for Discharge   Problem: Elimination: Goal: Will not experience complications related to bowel motility Outcome: Adequate for Discharge Goal: Will not experience complications related to urinary retention Outcome: Adequate for Discharge   Problem: Pain Managment: Goal: General experience of comfort will improve Outcome: Adequate for Discharge   Problem: Safety: Goal: Ability to remain free from injury will improve Outcome: Adequate for Discharge   Problem: Skin Integrity: Goal: Risk for impaired skin integrity will decrease Outcome: Adequate for Discharge   Problem: Education: Goal: Knowledge of condition will improve Outcome: Adequate for Discharge Goal: Individualized Educational Video(s) Outcome: Adequate for Discharge Goal: Individualized Newborn Educational Video(s) Outcome: Adequate for  Discharge   Problem: Activity: Goal: Will verbalize the importance of balancing activity with adequate rest periods Outcome: Adequate for Discharge Goal: Ability to tolerate increased activity will improve Outcome: Adequate for Discharge   Problem: Coping: Goal: Ability to identify and utilize available resources and services will improve Outcome: Adequate for Discharge   Problem: Life Cycle: Goal: Chance of risk for complications during the postpartum period will decrease Outcome: Adequate for Discharge   Problem: Role Relationship: Goal: Ability to demonstrate positive interaction with newborn will improve Outcome: Adequate for Discharge   Problem: Skin Integrity: Goal: Demonstration of wound healing without infection will improve Outcome: Adequate for Discharge   

## 2021-09-30 ENCOUNTER — Telehealth (HOSPITAL_COMMUNITY): Payer: Self-pay | Admitting: *Deleted

## 2021-09-30 NOTE — Telephone Encounter (Signed)
Mom reports feeling good. Incision healing well per mom. No concerns regarding herself at this time. EPDS=2 (hospital score=2) Mom reports baby is well. Feeding, peeing, and pooping without difficulty. Reviewed safe sleep. Mom has no concerns about baby at present.  Odis Hollingshead, RN 09-30-2021 at 2:00pm

## 2021-12-20 NOTE — Progress Notes (Deleted)
Annual Wellness Visit     Patient: Stephanie Mack, Female    DOB: 1990/11/17, 31 y.o.   MRN: [id number]  Subjective  No chief complaint on file.   Stephanie Mack is a 31 y.o. female who presents today for her Annual Wellness Visit. She reports consuming a {diet types:17450} diet. {Exercise:19826} She generally feels {well/fairly well/poorly:18703}. She reports sleeping {well/fairly well/poorly:18703}. She {does/does not:200015} have additional problems to discuss today. Recent CS delivery on 6/23.   HPI  Sex Hx Work Hx [employer] use  Illicit drug use  Alcohol use Lives with  {VISON DENTAL STD PSA (Optional):27386}   {History (Optional):23778}  Medications: Outpatient Medications Prior to Visit  Medication Sig   ACCU-CHEK FASTCLIX LANCETS MISC TEST UPTO 4 TIMES DAILY   aspirin EC 81 MG tablet Take 81 mg by mouth daily. Swallow whole.   blood glucose meter kit and supplies KIT Dispense based on patient and insurance preference. Use up to four times daily as directed. (FOR ICD-9 250.00, 250.01).   cholecalciferol (VITAMIN D3) 25 MCG (1000 UNIT) tablet Take 1,000 Units by mouth daily.   glucose blood test strip TEST UPTO 4 TIMES DAILY   ibuprofen (ADVIL) 600 MG tablet Take 1 tablet (600 mg total) by mouth every 6 (six) hours as needed for moderate pain.   metFORMIN (GLUMETZA) 1000 MG (MOD) 24 hr tablet Take 1 tablet, by mouth, after breakfast and after supper.   NIFEdipine (PROCARDIA-XL/NIFEDICAL-XL) 30 MG 24 hr tablet Take 30 mg by mouth daily.   oxyCODONE-acetaminophen (PERCOCET/ROXICET) 5-325 MG tablet Take 1-2 tablets by mouth every 4 (four) hours as needed for severe pain.   Prenatal Vit-Fe Fumarate-FA (PRENATAL MULTIVITAMIN) TABS tablet Take 1 tablet by mouth daily.   senna-docusate (SENOKOT-S) 8.6-50 MG tablet Take 2 tablets by mouth 2 (two) times daily as needed for mild constipation.   sertraline (ZOLOFT) 50 MG tablet Take 50 mg by mouth daily.   No  facility-administered medications prior to visit.    No Known Allergies  Patient Care Team: Alen Bleacher, MD as PCP - General (Family Medicine) Donnel Saxon, CNM as PCP - OBGYN (Obstetrics and Gynecology)  ROS      Objective  There were no vitals taken for this visit. {Vitals History (Optional):23777}  Physical Exam    Most recent functional status assessment:    09/21/2021    2:00 PM  In your present state of health, do you have any difficulty performing the following activities:  Doing errands, shopping? 0   Most recent fall risk assessment:    07/12/2019    2:56 PM  [place] in the past year? 0  Number falls in past yr: 0  Follow up Falls evaluation completed    Most recent depression screenings:    04/27/2020    3:14 PM 02/14/2020    9:34 AM  PHQ 2/9 Scores  PHQ - 2 Score 1 1  PHQ- 9 Score 7 3   Most recent cognitive screening:     No data to display         Most recent Audit-C alcohol use screening    08/25/2016    4:38 PM  Alcohol Use Disorder Test (AUDIT)  1. How often do you have a drink containing alcohol?   9. Have you or someone else been injured as a result of your drinking?   10. Has a relative or friend or a doctor or another health worker been concerned about your drinking or suggested you  cut down?   Alcohol Use Disorder Identification Test Final Score (AUDIT)      Information is confidential and restricted. Go to Review Flowsheets to unlock data.   A score of 3 or more in women, and 4 or more in men indicates increased risk for alcohol abuse, EXCEPT if all of the points are from question 1   Vision/Hearing Screen: No results found.  {Labs (Optional):23779}  No results found for any visits on 12/21/21.    Assessment & Plan   Annual wellness visit done today including the all of the following: Reviewed patient's Family Medical History Reviewed and updated list of patient's medical providers Assessment of cognitive  impairment was done Assessed patient's functional ability Established a written schedule for health screening [place] Completed and Reviewed  Exercise Activities and Dietary recommendations  Goals   None     Immunization History  Administered Date(s) Administered   Influenza,inj,Quad PF,6+ Mos 01/09/2015, 02/11/2016   MMR 01/10/2015   Pneumococcal Polysaccharide-23 08/10/2016   Rho (D) Immune Globulin 09/03/2012   Tdap 01/08/2015    Health Maintenance  Topic Date Due   COVID-19 Vaccine (1) Never done   FOOT EXAM  05/17/2018   OPHTHALMOLOGY EXAM  01/25/2019   HEMOGLOBIN A1C  10/25/2020   Diabetic kidney evaluation - Urine ACR  07/13/2022   Diabetic kidney evaluation - GFR measurement  09/21/2022   PAP-Cervical Cytology Screening  07/09/2023   PAP SMEAR-Modifier  07/09/2023   TETANUS/TDAP  01/07/2025   Hepatitis C Screening  Completed   HIV Screening  Completed   HPV VACCINES  Aged Out     Discussed health benefits of physical activity, and encouraged her to engage in regular exercise appropriate for her age and condition.    Problem List Items Addressed This Visit   None   HCM Patient with hx of T2DM is due for her annual foot and ophthalmology exam. -Foot exam today was normal -Advised patient to follow with opthomologiest for her annual eye exam  No follow-ups on file.     Alen Bleacher, MD

## 2021-12-21 ENCOUNTER — Encounter: Payer: Medicaid Other | Admitting: Student

## 2022-01-11 ENCOUNTER — Ambulatory Visit (INDEPENDENT_AMBULATORY_CARE_PROVIDER_SITE_OTHER): Payer: Medicaid Other | Admitting: Student

## 2022-01-11 ENCOUNTER — Other Ambulatory Visit: Payer: Self-pay | Admitting: Student

## 2022-01-11 VITALS — BP 143/99 | HR 95 | Ht 61.0 in | Wt 159.2 lb

## 2022-01-11 DIAGNOSIS — N939 Abnormal uterine and vaginal bleeding, unspecified: Secondary | ICD-10-CM | POA: Diagnosis not present

## 2022-01-11 DIAGNOSIS — E119 Type 2 diabetes mellitus without complications: Secondary | ICD-10-CM

## 2022-01-11 DIAGNOSIS — E139 Other specified diabetes mellitus without complications: Secondary | ICD-10-CM | POA: Diagnosis not present

## 2022-01-11 DIAGNOSIS — I10 Essential (primary) hypertension: Secondary | ICD-10-CM

## 2022-01-11 MED ORDER — AMLODIPINE BESYLATE 5 MG PO TABS: 5.0000 mg | ORAL_TABLET | Freq: Every day | ORAL | 3 refills | Status: AC

## 2022-01-11 MED ORDER — IBUPROFEN 600 MG PO TABS: 600.0000 mg | ORAL_TABLET | Freq: Four times a day (QID) | ORAL | 3 refills | Status: DC | PRN

## 2022-01-11 MED ORDER — MEDROXYPROGESTERONE ACETATE 10 MG PO TABS: 10.0000 mg | ORAL_TABLET | Freq: Every day | ORAL | 0 refills | Status: DC

## 2022-01-11 MED ORDER — METFORMIN HCL ER (MOD) 1000 MG PO TB24: ORAL_TABLET | ORAL | 2 refills | Status: DC

## 2022-01-11 MED ORDER — NIFEDIPINE ER OSMOTIC RELEASE 30 MG PO TB24: 30.0000 mg | ORAL_TABLET | Freq: Every day | ORAL | 0 refills | Status: DC

## 2022-01-11 NOTE — Progress Notes (Cosign Needed Addendum)
    SUBJECTIVE:   CHIEF COMPLAINT / HPI:   Patient is a 31 year old female presenting today for vaginal bleeding. She reports vaginal bleed has been going on for 2 months everyday. Initially bleeding stopped after delivery but restarted a month later and since then has had daily vaginal bleeding for 2 months. Describes it as a heavy bleeding. She soaks about 3 pads every day. Patient have abdominal pain which she describes as cramping pain. Cramp is constant and mostly localized over the suprapelvic area. She has only tried tylenol for pain without significant relief.  Denies any abdominal distention, SOB, dizziness but have intermittent lightheadedness  Birth was via CS  without complication. Had tubal ligation postdelivery. Currently not on any birth control and baby is strictly formula fed.  Elevated BP Patient's blood pressure today was elevated at 143/99. Medication include nifedipine and patient's reports noncompliance to medication. She does have blood pressure cuff at home but does not check ambulatory blood pressures.  PERTINENT  PMH / PSH: Reviewed   OBJECTIVE:   BP (!) 143/99   Pulse 95   Ht '5\' 1"'$  (1.549 m)   Wt 159 lb 4 oz (72.2 kg)   SpO2 98%   BMI 30.09 kg/m    Physical Exam General: Alert, well appearing, NAD Cardiovascular: RRR, No Murmurs, Normal S2/S2 Respiratory: CTAB, No wheezing or Rales Abdomen: No distension, Mild suprapubic tenderness Extremities: No edema on extremities    ASSESSMENT/PLAN:   Abnormal uterine bleeding Patient reports 2 months of abnormal uterine bleeding that's non stop. Endorses intermittent lightheadedness. Unclear causes of AUB at this time. Exam was unremarkable aside from mild suprapubic tenderness. -RX Provera 10 mg daily for 10 days (Changed to '20mg'$  TID) -Refilled ibuprofen - Ordered CBC to check for Anemia -Patient to follow up in 2 weeks  Essential hypertension, benign BP today is elevated at 143/99 and patient is  symptomatic.  Patient reports noncompliance to medication .  Per chart review home medication include nifedipine.  -Started patient on amlodipine 5 mg 3 times daily -Advised patient to keep daily blood pressure diary. -Follow-up in 2 weeks to reassess blood pressure  Type 2 diabetes mellitus ([place]) Refilled patient's metformin.    Note Addendum: Called patient to inform her that I have increased the provera to '20mg'$  TID. She still endorses vaginal bleeding at this time.,  Alen Bleacher, MD [place]

## 2022-01-11 NOTE — Assessment & Plan Note (Signed)
Refilled patient's metformin.

## 2022-01-11 NOTE — Assessment & Plan Note (Signed)
BP today is elevated at 143/99 and patient is symptomatic.  Patient reports noncompliance to medication .  Per chart review home medication include nifedipine.  -Started patient on amlodipine 5 mg 3 times daily -Advised patient to keep daily blood pressure diary. -Follow-up in 2 weeks to reassess blood pressure

## 2022-01-11 NOTE — Patient Instructions (Addendum)
It was wonderful to meet you today. Thank you for allowing me to be a part of your care. Below is a short summary of what we discussed at your visit today:  For your abnormal bleeding I have prescribed Provera which you should take daily for 10 days.  Blood pressure today was elevated.  We will switch you to amlodipine 5 mg which you should take daily.  Please keep a daily diary of your blood pressure and follow-up in 2 weeks to reevaluate your blood pressure..  Please bring all of your medications to every appointment!  If you have any questions or concerns, please do not hesitate to contact us via phone or MyChart message.   Alen Bleacher, MD [facility]

## 2022-01-12 LAB — CBC WITH DIFFERENTIAL/PLATELET
Basophils Absolute: 0 10*3/uL (ref 0.0–0.2)
Basos: 1 %
EOS (ABSOLUTE): 0.1 10*3/uL (ref 0.0–0.4)
Eos: 1 %
Hematocrit: 37.3 % (ref 34.0–46.6)
Hemoglobin: 11.7 g/dL (ref 11.1–15.9)
Immature Grans (Abs): 0 10*3/uL (ref 0.0–0.1)
Immature Granulocytes: 0 %
Lymphocytes Absolute: 3.4 10*3/uL — ABNORMAL HIGH (ref 0.7–3.1)
Lymphs: 46 %
MCH: 23.8 pg — ABNORMAL LOW (ref 26.6–33.0)
MCHC: 31.4 g/dL — ABNORMAL LOW (ref 31.5–35.7)
MCV: 76 fL — ABNORMAL LOW (ref 79–97)
Monocytes Absolute: 0.6 10*3/uL (ref 0.1–0.9)
Monocytes: 8 %
Neutrophils Absolute: 3.3 10*3/uL (ref 1.4–7.0)
Neutrophils: 44 %
Platelets: 276 10*3/uL (ref 150–450)
RBC: 4.91 x10E6/uL (ref 3.77–5.28)
RDW: 14.3 % (ref 11.7–15.4)
WBC: 7.4 10*3/uL (ref 3.4–10.8)

## 2022-01-15 ENCOUNTER — Other Ambulatory Visit: Payer: Self-pay | Admitting: Student

## 2022-01-15 DIAGNOSIS — N939 Abnormal uterine and vaginal bleeding, unspecified: Secondary | ICD-10-CM

## 2022-01-15 MED ORDER — MEDROXYPROGESTERONE ACETATE 10 MG PO TABS: 20.0000 mg | ORAL_TABLET | Freq: Three times a day (TID) | ORAL | 0 refills | Status: DC

## 2022-01-15 NOTE — Progress Notes (Signed)
Called patient to discuss medication change.  Stephanie Mack still endorses bleeding. Advised her to increase her Provera to '20mg'$  TID and that I have sent in prescription for her to take for 10 days.

## 2022-01-20 NOTE — Progress Notes (Deleted)
    SUBJECTIVE:   CHIEF COMPLAINT / HPI:   Patient a 31 year old female presenting today for follow up of AUB She was seen alittle over a week ago for continuous abnormal bleeding for 4 months She was started on na 10 day course of provera Patient report/denies improvement of symptoms Denies/report light headedness, SOB or chest pain  PERTINENT  PMH / PSH: ***  OBJECTIVE:   There were no vitals taken for this visit.  ***  Physical Exam General: Alert, well appearing, NAD, Oriented x4 Cardiovascular: RRR, No Murmurs, Normal S2/S2 Respiratory: CTAB, No wheezing or Rales Abdomen: No distension or tenderness Extremities: No edema on extremities   Skin: Warm and dry  ASSESSMENT/PLAN:   No problem-specific Assessment & Plan notes found for this encounter.     Alen Bleacher, MD [place]   {    This will disappear when note is signed, click to select method of visit    :1}

## 2022-01-21 ENCOUNTER — Ambulatory Visit: Payer: Self-pay | Admitting: Student

## 2022-04-10 ENCOUNTER — Other Ambulatory Visit: Payer: Self-pay

## 2022-04-10 ENCOUNTER — Inpatient Hospital Stay (HOSPITAL_COMMUNITY)
Admission: AD | Admit: 2022-04-10 | Discharge: 2022-04-10 | Disposition: A | Discharge status: Home / Self Care | Payer: Medicaid Other | Attending: Obstetrics and Gynecology | Admitting: Obstetrics and Gynecology

## 2022-04-10 ENCOUNTER — Encounter (HOSPITAL_COMMUNITY): Payer: Self-pay | Admitting: Obstetrics and Gynecology

## 2022-04-10 DIAGNOSIS — Z3202 Encounter for pregnancy test, result negative: Secondary | ICD-10-CM | POA: Diagnosis not present

## 2022-04-10 DIAGNOSIS — N939 Abnormal uterine and vaginal bleeding, unspecified: Secondary | ICD-10-CM | POA: Diagnosis present

## 2022-04-10 DIAGNOSIS — Z789 Other specified health status: Secondary | ICD-10-CM

## 2022-04-10 LAB — CBC WITH DIFFERENTIAL/PLATELET
Abs Immature Granulocytes: 0.01 10*3/uL (ref 0.00–0.07)
Basophils Absolute: 0 10*3/uL (ref 0.0–0.1)
Basophils Relative: 0 %
Eosinophils Absolute: 0 10*3/uL (ref 0.0–0.5)
Eosinophils Relative: 0 %
HCT: 40.3 % (ref 36.0–46.0)
Hemoglobin: 12.6 g/dL (ref 12.0–15.0)
Immature Granulocytes: 0 %
Lymphocytes Relative: 50 %
Lymphs Abs: 4 10*3/uL (ref 0.7–4.0)
MCH: 24 pg — ABNORMAL LOW (ref 26.0–34.0)
MCHC: 31.3 g/dL (ref 30.0–36.0)
MCV: 76.9 fL — ABNORMAL LOW (ref 80.0–100.0)
Monocytes Absolute: 0.3 10*3/uL (ref 0.1–1.0)
Monocytes Relative: 4 %
Neutro Abs: 3.7 10*3/uL (ref 1.7–7.7)
Neutrophils Relative %: 46 %
Platelets: 297 10*3/uL (ref 150–400)
RBC: 5.24 MIL/uL — ABNORMAL HIGH (ref 3.87–5.11)
RDW: 15.1 % (ref 11.5–15.5)
WBC: 8.1 10*3/uL (ref 4.0–10.5)
nRBC: 0 % (ref 0.0–0.2)

## 2022-04-10 LAB — URINALYSIS, ROUTINE W REFLEX MICROSCOPIC
Bacteria, UA: NONE SEEN
Bilirubin Urine: NEGATIVE
Glucose, UA: NEGATIVE mg/dL
Ketones, ur: NEGATIVE mg/dL
Leukocytes,Ua: NEGATIVE
Nitrite: NEGATIVE
Protein, ur: 100 mg/dL — AB
Specific Gravity, Urine: 1.025 (ref 1.005–1.030)
pH: 5 (ref 5.0–8.0)

## 2022-04-10 LAB — TSH: TSH: 2.153 u[IU]/mL (ref 0.350–4.500)

## 2022-04-10 LAB — WET PREP, GENITAL
Clue Cells Wet Prep HPF POC: NONE SEEN
Sperm: NONE SEEN
Trich, Wet Prep: NONE SEEN
WBC, Wet Prep HPF POC: <10 (ref <10)
Yeast Wet Prep HPF POC: NONE SEEN

## 2022-04-10 LAB — POCT PREGNANCY, URINE: Preg Test, Ur: NEGATIVE

## 2022-04-10 MED ORDER — MEGESTROL ACETATE 20 MG PO TABS: 20.0000 mg | ORAL_TABLET | Freq: Every day | ORAL | 2 refills | Status: DC

## 2022-04-10 NOTE — MAU Note (Signed)
Stephanie Mack is a 32 y.o. at Unknown here in MAU reporting: delivered 09/21/2021.  Has had ongoing bleeding since then, has never stopped.  Soaking pads, changing 2-3/day.Went to [facility] visits and has been seen since for this.  Was told there is nothing they can do.  Had her tubes tied.  Doesn't understand. Having pain in her upper abd, that has also been ongoing since delivery.  LMP: - still bleeding since June delivery Onset of complaint: ongoing since June Pain score: 8 Vitals:   04/10/22 1650  BP: (!) 144/98  Pulse: 87  Resp: 17  Temp: 98.4 F (36.9 C)  SpO2: 100%     Lab orders placed from triage:  upt

## 2022-04-10 NOTE — Progress Notes (Signed)
Bridgette Habermann, CNM at bedside reviewing D/C instructions and follow-up care. CNM aware of elevated BP, pt states she has not taken her BP medications today otherwise asymptomatic

## 2022-04-10 NOTE — MAU Provider Note (Addendum)
History     CSN: [id number]  Arrival date and time: 04/10/22 1625   Event Date/Time   First Provider Initiated Contact with Patient 04/10/22 1837      Chief Complaint  Patient presents with   Vaginal Bleeding   Abdominal Pain   Stephanie Mack , a  32 y.o. [phone] non-pregnant presents to MAU with complaints of on-going vaginal bleeding and abdominal pain since 09/21/21 the day she had her c-section. Patient reports her postpartum bleeding never stopped. She noted that she went to her postpartum visit and still had bleeding. She had her tubes tied at time of C/S. Patient reports heavy bright red bleeding and saturating 2-3 times per day and passing clots. She endorses lighter days without saturating but  "still heavy". Patient states she was placed on Depo for bleeding by OBGYN last dose in November 2023. She endorses on heavier days some dizziness, nausea and fatigue.   She also endorses upper umbilicus abdominal pain that has been present since delivery. Patient states she "regularly takes ibuprofen and or Tylenol" for pain and it never goes away, but improves. Denies taking anything for pain today. The waxing ans waning pain is painfully present for days then lightens for days. Reporting "cramping pain" at its worse is a 9/10 currently rating pain 6/10. Denies constipation, last BM was today.    Patient's grandmother present for visit and contributing to HPI.     OB History     Gravida  4   Para  3   Term  0   Preterm  3   AB  1   Living  2      SAB  1   IAB  0   Ectopic  0   Multiple  0   Live Births  3           Past Medical History:  Diagnosis Date   ADHD (attention deficit hyperactivity disorder)    Anemia    Anxiety    Bipolar 1 disorder ([place])    Chlamydia 05/31/2010   Chronic constipation    Depression    depression   Fibroid    GERD (gastroesophageal reflux disease)    with pregnancy   Gestational diabetes    current pregnancy   Gonorrhea  contact, treated    Headache    Hypertension    Infection    UTI   Pregnancy induced hypertension    previous pregnancy   Pseudocyesis 2013   Seen in MAU for percieved FM, abd distension. Normal exam.    Sickle cell trait ([place])    Type 2 diabetes mellitus ([place]) 02/13/2017    Past Surgical History:  Procedure Laterality Date   CESAREAN SECTION N/A 09/01/2012   Procedure:  Primary cesarean section with delivery of baby girl at 52.  Apgars 1/1.  ;  Surgeon: Frederico Hamman, MD;  Location: [facility];  Service: Obstetrics;  Laterality: N/A;   CESAREAN SECTION N/A 01/07/2015   Procedure: REPEAT CESAREAN SECTION;  Surgeon: Shelly Bombard, MD;  Location: [facility];  Service: Obstetrics;  Laterality: N/A;   CESAREAN SECTION WITH BILATERAL TUBAL LIGATION N/A 09/21/2021   Procedure: CESAREAN SECTION WITH BILATERAL TUBAL LIGATION;  Surgeon: Sanjuana Kava, MD;  Location: [facility];  Service: Obstetrics;  Laterality: N/A;   DILATION AND CURETTAGE OF UTERUS     NASAL SEPTUM SURGERY     WISDOM TOOTH EXTRACTION      Family History  Problem Relation Age of Onset  Kidney disease Mother    Lupus Mother    Vision loss Father    Hypertension Father    Cancer Sister    Asthma Brother    Diabetes Maternal Grandfather    Heart disease Neg Hx     Social History   Tobacco Use   Smoking status: Never   Smokeless tobacco: Never  Vaping Use   Vaping Use: Never used  Substance Use Topics   Alcohol use: No    Alcohol/week: 0.0 standard drinks of alcohol   Drug use: No    Allergies: No Known Allergies  Medications Prior to Admission  Medication Sig Dispense Refill Last Dose   ACCU-CHEK FASTCLIX LANCETS MISC TEST UPTO 4 TIMES DAILY 102 each 3 04/10/2022   blood glucose meter kit and supplies KIT Dispense based on patient and insurance preference. Use up to four times daily as directed. (FOR ICD-9 250.00, 250.01). 1 each 0 04/10/2022   glucose blood test strip TEST UPTO 4 TIMES DAILY 100 each 3  04/10/2022   ibuprofen (ADVIL) 600 MG tablet Take 1 tablet (600 mg total) by mouth every 6 (six) hours as needed for moderate pain. 60 tablet 3 04/09/2022   medroxyPROGESTERone (DEPO-PROVERA) 150 MG/ML injection Inject 150 mg into the muscle every 3 (three) months.      metFORMIN (GLUMETZA) 1000 MG (MOD) 24 hr tablet Take 1 tablet, by mouth, after breakfast and after supper. 60 tablet 2 04/10/2022 at 0900   senna-docusate (SENOKOT-S) 8.6-50 MG tablet Take 2 tablets by mouth 2 (two) times daily as needed for mild constipation. 60 tablet 3 Past Week   sertraline (ZOLOFT) 50 MG tablet Take 50 mg by mouth daily.   04/10/2022 at 0900   amLODipine (NORVASC) 5 MG tablet Take 1 tablet (5 mg total) by mouth at bedtime. (Patient not taking: Reported on 04/10/2022) 90 tablet 3 Not Taking   aspirin EC 81 MG tablet Take 81 mg by mouth daily. Swallow whole. (Patient not taking: Reported on 04/10/2022)   Not Taking   cholecalciferol (VITAMIN D3) 25 MCG (1000 UNIT) tablet Take 1,000 Units by mouth daily. (Patient not taking: Reported on 04/10/2022)   Not Taking   medroxyPROGESTERone (PROVERA) 10 MG tablet Take 2 tablets (20 mg total) by mouth 3 (three) times daily for 10 days. (Patient not taking: Reported on 04/10/2022) 60 tablet 0 Not Taking   oxyCODONE-acetaminophen (PERCOCET/ROXICET) 5-325 MG tablet Take 1-2 tablets by mouth every 4 (four) hours as needed for severe pain. (Patient not taking: Reported on 04/10/2022) 30 tablet 0 Not Taking   Prenatal Vit-Fe Fumarate-FA (PRENATAL MULTIVITAMIN) TABS tablet Take 1 tablet by mouth daily. (Patient not taking: Reported on 04/10/2022)   Not Taking    Review of Systems  Constitutional:  Positive for fatigue. Negative for chills and fever.  Eyes:  Negative for pain and visual disturbance.  Respiratory:  Negative for apnea, shortness of breath and wheezing.   Cardiovascular:  Negative for chest pain and palpitations.  Gastrointestinal:  Positive for abdominal pain. Negative for  constipation, diarrhea, nausea and vomiting.  Genitourinary:  Positive for vaginal bleeding. Negative for difficulty urinating, dysuria and pelvic pain.  Musculoskeletal:  Negative for back pain.  Neurological:  Positive for dizziness, weakness and light-headedness. Negative for seizures and headaches.  Psychiatric/Behavioral:  Negative for suicidal ideas.    Physical Exam   Blood pressure (!) 141/98, pulse 79, temperature 98.4 F (36.9 C), temperature source Oral, resp. rate 17, height '5\' 3"'$  (1.6 m), weight 71.7 kg,  SpO2 100 %, not currently breastfeeding.  Physical Exam Vitals and nursing note reviewed. Exam conducted with a chaperone present.  Constitutional:      General: She is not in acute distress.    Appearance: Normal appearance.  HENT:     Head: Normocephalic.  Pulmonary:     Effort: Pulmonary effort is normal.  Abdominal:     Palpations: Abdomen is soft.     Tenderness: There is abdominal tenderness in the periumbilical area and left lower quadrant. There is no guarding or rebound.  Genitourinary:    Vagina: Bleeding present.     Comments: Blood observes on labia and inner thighs. Bright red & Brown vaginal bleeding noted on exam. No pooling into the speculum no clots. Bleeding cleared with 1 fox swab.  Musculoskeletal:     Cervical back: Normal range of motion.  Skin:    General: Skin is warm and dry.  Neurological:     Mental Status: She is alert and oriented to person, place, and time.  Psychiatric:        Mood and Affect: Mood normal.     MAU Course  Procedures Orders Placed This Encounter  Procedures   Wet prep, genital   US PELVIC COMPLETE WITH TRANSVAGINAL   Urinalysis, Routine w reflex microscopic Urine, Clean Catch   CBC with Differential/Platelet   T3, free   T4   TSH   Pregnancy, urine POC   Discharge patient   Results for orders placed or performed during the hospital encounter of 04/10/22 (from the past 24 hour(s))  Pregnancy, urine POC      Status: None   Collection Time: 04/10/22  5:06 PM  Result Value Ref Range   Preg Test, Ur NEGATIVE NEGATIVE  Urinalysis, Routine w reflex microscopic Urine, Clean Catch     Status: Abnormal   Collection Time: 04/10/22  5:53 PM  Result Value Ref Range   Color, Urine YELLOW YELLOW   APPearance HAZY (A) CLEAR   Specific Gravity, Urine 1.025 1.005 - 1.030   pH 5.0 5.0 - 8.0   Glucose, UA NEGATIVE NEGATIVE mg/dL   Hgb urine dipstick MODERATE (A) NEGATIVE   Bilirubin Urine NEGATIVE NEGATIVE   Ketones, ur NEGATIVE NEGATIVE mg/dL   Protein, ur 100 (A) NEGATIVE mg/dL   Nitrite NEGATIVE NEGATIVE   Leukocytes,Ua NEGATIVE NEGATIVE   RBC / HPF 0-5 0 - 5 RBC/hpf   WBC, UA 0-5 0 - 5 WBC/hpf   Bacteria, UA NONE SEEN NONE SEEN   Squamous Epithelial / HPF 6-10 0 - 5 /HPF   Mucus PRESENT   Wet prep, genital     Status: None   Collection Time: 04/10/22  7:30 PM   Specimen: PATH Cytology Cervicovaginal Ancillary Only  Result Value Ref Range   Yeast Wet Prep HPF POC NONE SEEN NONE SEEN   Trich, Wet Prep NONE SEEN NONE SEEN   Clue Cells Wet Prep HPF POC NONE SEEN NONE SEEN   WBC, Wet Prep HPF POC <10 <10   Sperm NONE SEEN   CBC with Differential/Platelet     Status: Abnormal   Collection Time: 04/10/22  8:00 PM  Result Value Ref Range   WBC 8.1 4.0 - 10.5 K/uL   RBC 5.24 (H) 3.87 - 5.11 MIL/uL   Hemoglobin 12.6 12.0 - 15.0 g/dL   HCT 40.3 36.0 - 46.0 %   MCV 76.9 (L) 80.0 - 100.0 fL   MCH 24.0 (L) 26.0 - 34.0 pg   MCHC 31.3  30.0 - 36.0 g/dL   RDW 15.1 11.5 - 15.5 %   Platelets 297 150 - 400 K/uL   nRBC 0.0 0.0 - 0.2 %   Neutrophils Relative % 46 %   Neutro Abs 3.7 1.7 - 7.7 K/uL   Lymphocytes Relative 50 %   Lymphs Abs 4.0 0.7 - 4.0 K/uL   Monocytes Relative 4 %   Monocytes Absolute 0.3 0.1 - 1.0 K/uL   Eosinophils Relative 0 %   Eosinophils Absolute 0.0 0.0 - 0.5 K/uL   Basophils Relative 0 %   Basophils Absolute 0.0 0.0 - 0.1 K/uL   Immature Granulocytes 0 %   Abs Immature  Granulocytes 0.01 0.00 - 0.07 K/uL    MDM - Non-pregnant  - Vaginal Bleeding stable at this time.  - Wet prep normal  - GC pending upon discharge.  - Plan for outpatient [place] follow up.   Recommended to stop Deprovera injection. Recommend to use Megace until outpatient follow up. Patient verbalized understanding.   Outpatient AUB Pelvic US ordered and Message sent to [facility] for follow up.   Rx for Megace sent to outpatient pharmacy   Report given to D. Moshe Cipro, CNM @ [address] ([place]) Rollene Rotunda, MSN, [place] for [place]  04/10/22 8:13 PM   Assumed care of patient at 2013  CBC stable- Hgb 12.6, Hct 40.3. Thyroid panel pending. Patient does not need to wait for these results. At this time, patient is stable for discharge home with close outpatient follow up.   Assessment and Plan  Nonpregnant female Abnormal Uterine Bleeding  - Discharge home in stable condition - Return precautions given. Return to MAU as needed for OBGYN emergencies - Follow up at [facility]- someone will contact to schedule appointment   Renee Harder, [place] 04/10/22, 8:28 PM

## 2022-04-11 LAB — GC/CHLAMYDIA PROBE AMP (~~LOC~~) NOT AT ARMC
Chlamydia: NEGATIVE
Comment: NEGATIVE
Comment: NORMAL
Neisseria Gonorrhea: NEGATIVE

## 2022-04-12 LAB — T3, FREE: T3, Free: 2.8 pg/mL (ref 2.0–4.4)

## 2022-04-12 LAB — T4: T4, Total: 6.8 ug/dL (ref 4.5–12.0)

## 2022-04-20 ENCOUNTER — Ambulatory Visit (HOSPITAL_COMMUNITY)
Admission: RE | Admit: 2022-04-20 | Discharge: 2022-04-20 | Disposition: A | Discharge status: Home / Self Care | Payer: Medicaid Other | Source: Ambulatory Visit | Attending: Certified Nurse Midwife | Admitting: Certified Nurse Midwife

## 2022-04-20 DIAGNOSIS — N939 Abnormal uterine and vaginal bleeding, unspecified: Secondary | ICD-10-CM | POA: Diagnosis present

## 2022-04-20 DIAGNOSIS — Z789 Other specified health status: Secondary | ICD-10-CM | POA: Diagnosis present

## 2022-04-29 ENCOUNTER — Telehealth: Payer: Self-pay | Admitting: Student

## 2022-04-29 NOTE — Telephone Encounter (Signed)
Friend dropped off form to be completed by pcp. She was last seen on 01/11/22.  Form is for special needs.  Put information to be signed in [place] folder this afternoon.  Any questions please call patient [phone]

## 2022-05-02 NOTE — Telephone Encounter (Signed)
Reviewed form and placed in PCP's box for completion.  .Samanthia Howland R Allanna Bresee, CMA  

## 2022-05-04 ENCOUNTER — Ambulatory Visit: Payer: Medicaid Other | Admitting: Obstetrics and Gynecology

## 2022-05-04 NOTE — Telephone Encounter (Signed)
Called and LVM for patient to make an appointment.  Stephanie Mack, [place]

## 2022-05-07 ENCOUNTER — Other Ambulatory Visit: Payer: Self-pay

## 2022-05-07 ENCOUNTER — Encounter (HOSPITAL_COMMUNITY): Payer: Self-pay

## 2022-05-07 ENCOUNTER — Emergency Department (HOSPITAL_COMMUNITY)
Admission: EM | Admit: 2022-05-07 | Discharge: 2022-05-07 | Disposition: A | Discharge status: Home / Self Care | Payer: Medicaid Other | Attending: Emergency Medicine | Admitting: Emergency Medicine

## 2022-05-07 DIAGNOSIS — E119 Type 2 diabetes mellitus without complications: Secondary | ICD-10-CM | POA: Diagnosis not present

## 2022-05-07 DIAGNOSIS — Z7984 Long term (current) use of oral hypoglycemic drugs: Secondary | ICD-10-CM | POA: Diagnosis not present

## 2022-05-07 DIAGNOSIS — I10 Essential (primary) hypertension: Secondary | ICD-10-CM | POA: Diagnosis not present

## 2022-05-07 DIAGNOSIS — R55 Syncope and collapse: Secondary | ICD-10-CM | POA: Insufficient documentation

## 2022-05-07 LAB — CBC WITH DIFFERENTIAL/PLATELET
Abs Immature Granulocytes: 0 10*3/uL (ref 0.00–0.07)
Basophils Absolute: 0 10*3/uL (ref 0.0–0.1)
Basophils Relative: 0 %
Eosinophils Absolute: 0 10*3/uL (ref 0.0–0.5)
Eosinophils Relative: 1 %
HCT: 40 % (ref 36.0–46.0)
Hemoglobin: 12.1 g/dL (ref 12.0–15.0)
Immature Granulocytes: 0 %
Lymphocytes Relative: 46 %
Lymphs Abs: 2.7 10*3/uL (ref 0.7–4.0)
MCH: 23.5 pg — ABNORMAL LOW (ref 26.0–34.0)
MCHC: 30.3 g/dL (ref 30.0–36.0)
MCV: 77.8 fL — ABNORMAL LOW (ref 80.0–100.0)
Monocytes Absolute: 0.4 10*3/uL (ref 0.1–1.0)
Monocytes Relative: 7 %
Neutro Abs: 2.7 10*3/uL (ref 1.7–7.7)
Neutrophils Relative %: 46 %
Platelets: 292 10*3/uL (ref 150–400)
RBC: 5.14 MIL/uL — ABNORMAL HIGH (ref 3.87–5.11)
RDW: 14.9 % (ref 11.5–15.5)
WBC: 5.8 10*3/uL (ref 4.0–10.5)
nRBC: 0 % (ref 0.0–0.2)

## 2022-05-07 LAB — BASIC METABOLIC PANEL
Anion gap: 10 (ref 5–15)
BUN: 10 mg/dL (ref 6–20)
CO2: 21 mmol/L — ABNORMAL LOW (ref 22–32)
Calcium: 8.7 mg/dL — ABNORMAL LOW (ref 8.9–10.3)
Chloride: 109 mmol/L (ref 98–111)
Creatinine, Ser: 0.63 mg/dL (ref 0.44–1.00)
GFR, Estimated: >60 mL/min (ref >60)
Glucose, Bld: 94 mg/dL (ref 70–99)
Potassium: 3.4 mmol/L — ABNORMAL LOW (ref 3.5–5.1)
Sodium: 140 mmol/L (ref 135–145)

## 2022-05-07 LAB — CBG MONITORING, ED: Glucose-Capillary: 104 mg/dL — ABNORMAL HIGH (ref 70–99)

## 2022-05-07 LAB — I-STAT BETA HCG BLOOD, ED (MC, WL, AP ONLY): I-stat hCG, quantitative: <5 m[IU]/mL (ref <5)

## 2022-05-07 NOTE — Discharge Instructions (Signed)
Your episode of passing out today.  Your lab results and EKG were all reassuring.  We do not have a cause today but at this time you are safe to go home.  Please follow-up with your primary care doctor on Monday and also schedule point with cardiology for further evaluation.  Come back to the ER for new or worsening symptoms.

## 2022-05-07 NOTE — ED Provider Notes (Signed)
[facility] Provider Note   CSN: [id number] Arrival date & time: 05/07/22  1213     History  Chief Complaint  Patient presents with   Loss of Consciousness    Stephanie Mack is a 32 y.o. female.  She has past medical history of hypertension and diabetes, she is only on metformin for diabetes.  She presents to the ED today via EMS after reported syncopal episode.  Patient states she was holding her 31-monthold child the couch while she was folding some laundry and she started feeling very lightheaded like she might pass out.  She handed the infant to her 758year-old daughter and asked her to call her grandmother.  Patient states she then passed out, she describes this as lying on the couch with her eyes closed able to hear her and experience of being around her but not able to get up or talk.  She is not sure if she fully lost consciousness or not.  This happened a couple weeks ago as well but she recovered fairly quickly and did not come to the hospital, since it happened again today patient wanted to be evaluated.  She denies any palpitations, chest pain, shortness of breath.  States she had a similar episode happened when she was pregnant as well.   Loss of Consciousness      Home Medications Prior to Admission medications   Medication Sig Start Date End Date Taking? Authorizing Provider  ACCU-CHEK FASTCLIX LANCETS MISC TEST UPTO 4 TIMES DAILY 04/18/18   DLanae Boast FNP  amLODipine (NORVASC) 5 MG tablet Take 1 tablet (5 mg total) by mouth at bedtime. Patient not taking: Reported on 04/10/2022 01/11/22   NAlen Bleacher MD  aspirin EC 81 MG tablet Take 81 mg by mouth daily. Swallow whole. Patient not taking: Reported on 04/10/2022    [provider]  blood glucose meter kit and supplies KIT Dispense based on patient and insurance preference. Use up to four times daily as directed. (FOR ICD-9 250.00, 250.01). 03/26/18   DLanae Boast FNP  cholecalciferol (VITAMIN D3) 25 MCG (1000 UNIT) tablet Take 1,000 Units by mouth daily. Patient not taking: Reported on 04/10/2022    [provider]  glucose blood test strip TEST UPTO 4 TIMES DAILY 04/18/18   DLanae Boast FNP  ibuprofen (ADVIL) 600 MG tablet Take 1 tablet (600 mg total) by mouth every 6 (six) hours as needed for moderate pain. 01/11/22   NAlen Bleacher MD  medroxyPROGESTERone (PROVERA) 10 MG tablet Take 2 tablets (20 mg total) by mouth 3 (three) times daily for 10 days. Patient not taking: Reported on 04/10/2022 01/15/22 01/25/22  NAlen Bleacher MD  megestrol (MEGACE) 20 MG tablet Take 1 tablet (20 mg total) by mouth daily. 04/10/22   PDeloris Ping CNM  metFORMIN (GLUMETZA) 1000 MG (MOD) 24 hr tablet Take 1 tablet, by mouth, after breakfast and after supper. 01/11/22   NAlen Bleacher MD  oxyCODONE-acetaminophen (PERCOCET/ROXICET) 5-325 MG tablet Take 1-2 tablets by mouth every 4 (four) hours as needed for severe pain. Patient not taking: Reported on 04/10/2022 09/23/21   PSanjuana Kava MD  Prenatal Vit-Fe Fumarate-FA (PRENATAL MULTIVITAMIN) TABS tablet Take 1 tablet by mouth daily. Patient not taking: Reported on 04/10/2022    [provider]  senna-docusate (SENOKOT-S) 8.6-50 MG tablet Take 2 tablets by mouth 2 (two) times daily as needed for mild constipation. 09/23/21   PSanjuana Kava MD  sertraline (ZOLOFT) 50 MG tablet Take  50 mg by mouth daily. 08/27/21   [provider]      Allergies    Patient has no known allergies.    Review of Systems   Review of Systems  Cardiovascular:  Positive for syncope.    Physical Exam Updated Vital Signs BP (!) 140/106   Pulse 71   Temp 98.3 F (36.8 C) (Oral)   Resp 19   SpO2 97%  Physical Exam Neurological:     General: No focal deficit present.     GCS: GCS eye subscore is 4. GCS verbal subscore is 5. GCS motor subscore is 6.     Cranial Nerves: Cranial nerves 2-12 are intact.      Sensory: Sensation is intact.     Motor: No weakness, tremor, seizure activity or pronator drift.     Coordination: Finger-Nose-Finger Test normal.     Gait: Gait is intact.     ED Results / Procedures / Treatments   Labs (all labs ordered are listed, but only abnormal results are displayed) Labs Reviewed  BASIC METABOLIC PANEL - Abnormal; Notable for the following components:      Result Value   Potassium 3.4 (*)    CO2 21 (*)    Calcium 8.7 (*)    All other components within normal limits  CBC WITH DIFFERENTIAL/PLATELET - Abnormal; Notable for the following components:   RBC 5.14 (*)    MCV 77.8 (*)    MCH 23.5 (*)    All other components within normal limits  CBG MONITORING, ED - Abnormal; Notable for the following components:   Glucose-Capillary 104 (*)    All other components within normal limits  I-STAT BETA HCG BLOOD, ED ([facility], [facility], [facility] ONLY)    EKG EKG Interpretation  Date/Time:  Saturday May 07 2022 12:20:40 EST Ventricular Rate:  66 PR Interval:  154 QRS Duration: 80 QT Interval:  402 QTC Calculation: 422 R Axis:   41 Text Interpretation: Sinus rhythm Confirmed by Godfrey Pick [phone]) on 05/07/2022 1:43:44 PM  Radiology No results found.  Procedures Procedures    Medications Ordered in ED Medications - No data to display  ED Course/ Medical Decision Making/ A&P                             Medical Decision Making This patient presents to the ED for concern of syncope, this involves an extensive number of treatment options, and is a complaint that carries with it a high risk of complications and morbidity.  The differential diagnosis includes vasovagal syncope, POTS, arrhythmia, seizure, otehr   Co morbidities that complicate the patient evaluation  DM, HTN   Additional history obtained:  Additional history obtained from EMR External records from outside source obtained and reviewed including prior outpatient notes/OB notes   Lab Tests:  I  Ordered, and personally interpreted labs.  The pertinent results include: CBC, BMP, pregnancy all reassuring patient is not       Cardiac Monitoring: / EKG:  The patient was maintained on a cardiac monitor.  I personally viewed and interpreted the cardiac monitored which showed an underlying rhythm of: Sinus Rhythm      Problem List / ED Course / Critical interventions / Medication management  Here for reported syncope-no tongue biting, convulsions or incontinence to suggest seizure.  Patient also seems to have been semiaware and had prodrome of dizziness when this happened favoring syncope.  She is overall low  risk with no EKG changes, no history of heart disease.  She is not orthostatic, she is feeling much better now and feels comfortable going home, her mother is with her and was agreeable with this plan as well.  There given strict return precautions.  They are given time to ask questions and these were all answered to their satisfaction  I have reviewed the patients home medicines and have made adjustments as needed     Test / Admission - Considered:  Consider admission for syncope but patient is low risk, young healthy with no history of heart disease.  Patient was also evaluated by attending, patient is to follow-up with her PCP and with cardiology for possible ambulatory cardiac monitoring for syncope workup    Amount and/or Complexity of Data Reviewed Labs: ordered.           Final Clinical Impression(s) / ED Diagnoses Final diagnoses:  Syncope and collapse    Rx / DC Orders ED Discharge Orders     None         Darci Current 05/07/22 1912    Isla Pence, MD 05/07/22 2028

## 2022-05-07 NOTE — ED Triage Notes (Signed)
Pt arrives via [facility] from home for syncopal episode. Pt was holding her baby and felt faint, passing the baby to another child and then she passed out from a seated position to laying on the couch. Pt not post ictal with EMS. Pt denies head injury.   EMS last VS - 158/98, HR 68, SPO2 99% on RA, CBG 111.

## 2022-05-11 ENCOUNTER — Ambulatory Visit (INDEPENDENT_AMBULATORY_CARE_PROVIDER_SITE_OTHER): Payer: Medicaid Other | Admitting: Student

## 2022-05-11 ENCOUNTER — Encounter: Payer: Self-pay | Admitting: Student

## 2022-05-11 VITALS — BP 120/80 | HR 80

## 2022-05-11 DIAGNOSIS — R42 Dizziness and giddiness: Secondary | ICD-10-CM | POA: Diagnosis not present

## 2022-05-11 LAB — GLUCOSE, POCT (MANUAL RESULT ENTRY): POC Glucose: 121 mg/dl — AB (ref 70–99)

## 2022-05-11 NOTE — Progress Notes (Signed)
    SUBJECTIVE:   CHIEF COMPLAINT / HPI:   Patient is a 32 year old female presenting today requesting completion of form for personal care service.  Past medical history includes bipolar, ADHD, T2DM and HTN.  Patient said she can't take care of yourself due to her medical condition which includes Diabetes, HTN and intermittent pain from ovarian cyst. Patient said she gets so weak and in too much pain. She passed out last week at her mom's place. Before passing out she got dizzy, hands sweating and jittery before passing out. Prompting EMS taking her to the ED. That was her only episode of passing out and since then she has he 2-3 episodes of dizziness. Not on insulin but on metformin '1000mg'$  XR daily. Endorses eating 2 meals a day. Work at [employer] with 2 kids and most recently had a baby 6 months ago.  Of note patient's reports feeling lightheaded while in the waiting area of the clinic today and so blood glucose was obtain which was 121.  PERTINENT  PMH / PSH: Reviewed   OBJECTIVE:   BP 120/80   Pulse 80   SpO2 97%    Physical Exam General: Alert, well appearing, NAD Cardiovascular: RRR, No Murmurs, Normal S2/S2 Respiratory: CTAB, No wheezing or Rales Abdomen: No distension or tenderness Extremities: No edema on extremities    ASSESSMENT/PLAN:   Dizziness Unclear cause of patient's dizziness and gait.  Unremarkable workup in the ED.  EKG showed sinus rhythm, TSH within normal limits and electrolytes fairly normal. Her symptoms could be likely due to dehydration or possible hypoglycemic episode given her reports of poor care and inconsistency with meals. Her exam today was unremarkable, BP was normal  and blood glucose was 121 -Obtain blood glucose -Placed referral for cardiology for further evaluation -PCS form was completed which will be faxed to [facility].  -Reviewed return precautions with patient.   Alen Bleacher, MD [place]

## 2022-05-11 NOTE — Patient Instructions (Signed)
It was wonderful to see you today. Thank you for allowing me to be a part of your care. Below is a short summary of what we discussed at your visit today:  I have completed your personal care service form.  We will fax it to [facility].  I encourage you to make sure you are eating adequately not to have low sugars and made you you are adequately hydrated.  Please bring all of your medications to every appointment!  If you have any questions or concerns, please do not hesitate to contact us via phone or MyChart message.   Alen Bleacher, MD [facility]

## 2022-05-13 ENCOUNTER — Telehealth: Payer: Self-pay

## 2022-05-13 NOTE — Telephone Encounter (Signed)
Personal Care Service forms faxed to [facility] with supporting office note.   Copy made for batch scanning.

## 2022-05-17 ENCOUNTER — Other Ambulatory Visit: Payer: Self-pay | Admitting: *Deleted

## 2022-05-17 MED ORDER — GLUCOSE BLOOD VI STRP: ORAL_STRIP | 3 refills | Status: DC

## 2022-05-18 ENCOUNTER — Encounter (HOSPITAL_COMMUNITY): Payer: Self-pay

## 2022-05-18 ENCOUNTER — Other Ambulatory Visit: Payer: Self-pay

## 2022-05-18 ENCOUNTER — Emergency Department (HOSPITAL_COMMUNITY)
Admission: EM | Admit: 2022-05-18 | Discharge: 2022-05-18 | Disposition: A | Discharge status: Home / Self Care | Payer: Medicaid Other | Attending: Emergency Medicine | Admitting: Emergency Medicine

## 2022-05-18 ENCOUNTER — Emergency Department (HOSPITAL_COMMUNITY): Payer: Medicaid Other

## 2022-05-18 DIAGNOSIS — E119 Type 2 diabetes mellitus without complications: Secondary | ICD-10-CM | POA: Insufficient documentation

## 2022-05-18 DIAGNOSIS — Z5329 Procedure and treatment not carried out because of patient's decision for other reasons: Secondary | ICD-10-CM | POA: Diagnosis not present

## 2022-05-18 DIAGNOSIS — I10 Essential (primary) hypertension: Secondary | ICD-10-CM | POA: Insufficient documentation

## 2022-05-18 DIAGNOSIS — I469 Cardiac arrest, cause unspecified: Secondary | ICD-10-CM

## 2022-05-18 DIAGNOSIS — Z7984 Long term (current) use of oral hypoglycemic drugs: Secondary | ICD-10-CM | POA: Insufficient documentation

## 2022-05-18 DIAGNOSIS — Z79899 Other long term (current) drug therapy: Secondary | ICD-10-CM | POA: Insufficient documentation

## 2022-05-18 DIAGNOSIS — M542 Cervicalgia: Secondary | ICD-10-CM | POA: Insufficient documentation

## 2022-05-18 LAB — COMPREHENSIVE METABOLIC PANEL
ALT: 14 U/L (ref 0–44)
AST: 16 U/L (ref 15–41)
Albumin: 4 g/dL (ref 3.5–5.0)
Alkaline Phosphatase: 50 U/L (ref 38–126)
Anion gap: 10 (ref 5–15)
BUN: 6 mg/dL (ref 6–20)
CO2: 20 mmol/L — ABNORMAL LOW (ref 22–32)
Calcium: 9.1 mg/dL (ref 8.9–10.3)
Chloride: 108 mmol/L (ref 98–111)
Creatinine, Ser: 0.64 mg/dL (ref 0.44–1.00)
GFR, Estimated: >60 mL/min (ref >60)
Glucose, Bld: 86 mg/dL (ref 70–99)
Potassium: 3.4 mmol/L — ABNORMAL LOW (ref 3.5–5.1)
Sodium: 138 mmol/L (ref 135–145)
Total Bilirubin: 0.6 mg/dL (ref 0.3–1.2)
Total Protein: 7.6 g/dL (ref 6.5–8.1)

## 2022-05-18 LAB — CBC WITH DIFFERENTIAL/PLATELET
Abs Immature Granulocytes: 0.02 10*3/uL (ref 0.00–0.07)
Basophils Absolute: 0 10*3/uL (ref 0.0–0.1)
Basophils Relative: 0 %
Eosinophils Absolute: 0 10*3/uL (ref 0.0–0.5)
Eosinophils Relative: 0 %
HCT: 41.8 % (ref 36.0–46.0)
Hemoglobin: 12.7 g/dL (ref 12.0–15.0)
Immature Granulocytes: 0 %
Lymphocytes Relative: 26 %
Lymphs Abs: 2.2 10*3/uL (ref 0.7–4.0)
MCH: 23.7 pg — ABNORMAL LOW (ref 26.0–34.0)
MCHC: 30.4 g/dL (ref 30.0–36.0)
MCV: 78 fL — ABNORMAL LOW (ref 80.0–100.0)
Monocytes Absolute: 0.6 10*3/uL (ref 0.1–1.0)
Monocytes Relative: 7 %
Neutro Abs: 5.8 10*3/uL (ref 1.7–7.7)
Neutrophils Relative %: 67 %
Platelets: 275 10*3/uL (ref 150–400)
RBC: 5.36 MIL/uL — ABNORMAL HIGH (ref 3.87–5.11)
RDW: 14.8 % (ref 11.5–15.5)
WBC: 8.6 10*3/uL (ref 4.0–10.5)
nRBC: 0 % (ref 0.0–0.2)

## 2022-05-18 LAB — TROPONIN I (HIGH SENSITIVITY)
Troponin I (High Sensitivity): 4 ng/L (ref <18)
Troponin I (High Sensitivity): 4 ng/L (ref <18)

## 2022-05-18 LAB — D-DIMER, QUANTITATIVE: D-Dimer, Quant: <0.27 ug/mL-FEU (ref 0.00–0.50)

## 2022-05-18 LAB — HCG, QUANTITATIVE, PREGNANCY: hCG, Beta Chain, Quant, S: 1 m[IU]/mL (ref <5)
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
## 2022-05-18 NOTE — ED Notes (Addendum)
Pt left AMA... PA Small was notified.Marland KitchenMarland Kitchen

## 2022-05-30 ENCOUNTER — Ambulatory Visit (INDEPENDENT_AMBULATORY_CARE_PROVIDER_SITE_OTHER): Payer: Medicaid Other | Admitting: Family Medicine

## 2022-05-30 ENCOUNTER — Encounter: Payer: Self-pay | Admitting: Family Medicine

## 2022-05-30 VITALS — BP 147/84 | HR 81 | Ht 63.0 in | Wt 154.4 lb

## 2022-05-30 DIAGNOSIS — I10 Essential (primary) hypertension: Secondary | ICD-10-CM

## 2022-05-30 DIAGNOSIS — R109 Unspecified abdominal pain: Secondary | ICD-10-CM | POA: Diagnosis present

## 2022-05-30 NOTE — Assessment & Plan Note (Addendum)
-  BP slightly elevated, likely secondary to her not taking her amlodipine yet today -continue amlodipine daily -instructed to maintain BP log at home twice daily and bring log to next visit  -follow up with PCP In 1 week for BP check -follow up with cardiologist 2/29 given extensive personal and family cardiac history

## 2022-05-30 NOTE — Patient Instructions (Addendum)
It was great seeing you today!  Today we discussed your diarrhea, I believe this is a GI virus. Make sure to stay hydrated. You should continue feel better.  Please check your blood pressure twice daily, please record this and bring to your next visit. If you are having blood pressures above 130/80 then please schedule with Dr. Adah Salvage. Please make sure to take your amlodipine daily.   Please follow up at your next scheduled appointment in 1 week, if anything arises between now and then, please don't hesitate to contact our office.   Thank you for allowing [place] to be a part of your medical care!  Thank you, Dr. Larae Grooms  Also a reminder of our clinic's no-show policy. Please make sure to arrive at least 15 minutes prior to your scheduled appointment time. Please try to cancel before 24 hours if you are not able to make it. If you no-show for 2 appointments then you will be receiving a warning letter. If you no-show after 3 visits, then you may be at risk of being dismissed from our clinic. This is to ensure that everyone is able to be seen in a timely manner. Thank you, we appreciate your assistance with this!

## 2022-05-30 NOTE — Progress Notes (Signed)
    SUBJECTIVE:   CHIEF COMPLAINT / HPI:   Patient presents with diarrhea for 5 days. Sick contacts include her 30 year old nephew who also had diarrhea. Denies any new foods. Has had about 3 episodes of diarrhea since her symptoms started, last episode was yesterday. Denies hematochezia or melena. Denies fever, chills, emesis, dysuria. Has had worsening abdominal pain since her diarrhea started, she has an ovarian cyst that causes her some abdominal pain at baseline for which she follows with gynecology. Able to tolerate fluids.   OBJECTIVE:   BP (!) 147/84   Pulse 81   Ht 5' 3"$  (1.6 m)   Wt 154 lb 6.4 oz (70 kg)   SpO2 100%   BMI 27.35 kg/m   General: Patient well-appearing, in no acute distress. HEENT: moist mucous membranes CV: RRR, no murmurs or gallops auscultated  Resp: CTAB, no wheezing, rales or rhonchi  noted Abdomen: soft, nontender, nondistended, presence of bowel sounds  MSK: no CVA tenderness  Ext: capillary refill less than 2 sec   ASSESSMENT/PLAN:   Abdominal pain -generalized pain, unremarkable abdominal exam. Likely secondary to cyst but worsening from recent GI illness. It seems patient is improving and expect that she continue to improve. -discussed importance of adequate hydration -work note provided -reassurance provided   Essential hypertension, benign -BP slightly elevated, likely secondary to her not taking her amlodipine yet today -continue amlodipine daily -instructed to maintain BP log at home twice daily and bring log to next visit  -follow up with PCP In 1 week for BP check -follow up with cardiologist 2/29 given extensive personal and family cardiac history    Stephanie Mack, [place]

## 2022-05-30 NOTE — Assessment & Plan Note (Signed)
-  generalized pain, unremarkable abdominal exam. Likely secondary to cyst but worsening from recent GI illness. It seems patient is improving and expect that she continue to improve. -discussed importance of adequate hydration -work note provided -reassurance provided

## 2022-06-01 NOTE — Progress Notes (Unsigned)
Cardiology Office Note:    Date:  06/01/2022   ID:  Stephanie Mack, DOB 1990/06/04, MRN [id number]  PCP:  Stephanie Bleacher, MD   [place] Providers Cardiologist:  None { Click to update primary MD,subspecialty MD or APP then REFRESH:1}    Referring MD: McDiarmid, Blane Ohara, MD   No chief complaint on file. ***  History of Present Illness:    Stephanie Mack is a 32 y.o. female with a hx of ADHD, bipolar, referral for LH. Orthostatics today ***. EKG earlier this month is normal.   Past Medical History:  Diagnosis Date   ADHD (attention deficit hyperactivity disorder)    Anemia    Anxiety    Bipolar 1 disorder ([place])    Chlamydia 05/31/2010   Chronic constipation    Depression    depression   Fibroid    GERD (gastroesophageal reflux disease)    with pregnancy   Gestational diabetes    current pregnancy   Gonorrhea contact, treated    Headache    Hypertension    Infection    UTI   Pregnancy induced hypertension    previous pregnancy   Pseudocyesis 2013   Seen in [facility] for percieved FM, abd distension. Normal exam.    Sickle cell trait ([place])    Type 2 diabetes mellitus ([place]) 02/13/2017    Past Surgical History:  Procedure Laterality Date   CESAREAN SECTION N/A 09/01/2012   Procedure:  Primary cesarean section with delivery of baby girl at 39.  Apgars 1/1.  ;  Surgeon: Stephanie Hamman, MD;  Location: [facility];  Service: Obstetrics;  Laterality: N/A;   CESAREAN SECTION N/A 01/07/2015   Procedure: REPEAT CESAREAN SECTION;  Surgeon: Stephanie Bombard, MD;  Location: [facility];  Service: Obstetrics;  Laterality: N/A;   CESAREAN SECTION WITH BILATERAL TUBAL LIGATION N/A 09/21/2021   Procedure: CESAREAN SECTION WITH BILATERAL TUBAL LIGATION;  Surgeon: Stephanie Kava, MD;  Location: [facility];  Service: Obstetrics;  Laterality: N/A;   DILATION AND CURETTAGE OF UTERUS     NASAL SEPTUM SURGERY     WISDOM TOOTH EXTRACTION      Current Medications: No outpatient  medications have been marked as taking for the 06/02/22 encounter (Appointment) with Stephanie Mayo, MD.     Allergies:   Patient has no known allergies.   Social History   Socioeconomic History   Marital status: Single    Spouse name: Not on file   Number of children: Not on file   Years of education: Not on file   Highest education level: Not on file  Occupational History   Not on file  Tobacco Use   Smoking status: Never    Passive exposure: Never   Smokeless tobacco: Never  Vaping Use   Vaping Use: Never used  Substance and Sexual Activity   Alcohol use: No    Alcohol/week: 0.0 standard drinks of alcohol   Drug use: No   Sexual activity: Not Currently    Partners: Male    Birth control/protection: None  Other Topics Concern   Not on file  Social History Narrative   Single, one daughter born 2016          Social Determinants of Health   Financial Resource Strain: Not on file  Food Insecurity: Not on file  Transportation Needs: Not on file  Physical Activity: Not on file  Stress: Not on file  Social Connections: Not on file     Family History:  The patient's ***family history includes Asthma in her brother; Cancer in her sister; Diabetes in her maternal grandfather; Hypertension in her father; Kidney disease in her mother; Lupus in her mother; Vision loss in her father. There is no history of Heart disease.  ROS:   Please see the history of present illness.    *** All other systems reviewed and are negative.  EKGs/Labs/Other Studies Reviewed:    The following studies were reviewed today: ***  EKG:  EKG is *** ordered today.  The ekg ordered today demonstrates ***  Recent Labs: 07/12/2021: Magnesium 1.8 04/10/2022: TSH 2.153 05/18/2022: ALT 14; BUN 6; Creatinine, Ser 0.64; Hemoglobin 12.7; Platelets 275; Potassium 3.4; Sodium 138  Recent Lipid Panel    Component Value Date/Time   CHOL 194 08/27/2016 0615   TRIG 211 (H) 08/27/2016 0615   HDL 47 08/27/2016  0615   CHOLHDL 4.1 08/27/2016 0615   VLDL 42 (H) 08/27/2016 0615   LDLCALC 105 (H) 08/27/2016 0615     Risk Assessment/Calculations:   {Does this patient have ATRIAL FIBRILLATION?:[phone]}  No BP recorded.  {Refresh Note OR Click here to enter BP  :1}***         Physical Exam:    VS:  There were no vitals taken for this visit.    Wt Readings from Last 3 Encounters:  05/30/22 154 lb 6.4 oz (70 kg)  04/10/22 158 lb (71.7 kg)  01/11/22 159 lb 4 oz (72.2 kg)     GEN: *** Well nourished, well developed in no acute distress HEENT: Normal NECK: No JVD; No carotid bruits LYMPHATICS: No lymphadenopathy CARDIAC: ***RRR, no murmurs, rubs, gallops RESPIRATORY:  Clear to auscultation without rales, wheezing or rhonchi  ABDOMEN: Soft, non-tender, non-distended MUSCULOSKELETAL:  No edema; No deformity  SKIN: Warm and dry NEUROLOGIC:  Alert and oriented x 3 PSYCHIATRIC:  Normal affect   ASSESSMENT:    LH: EKG is normal. No murmur on exam. Low probability for afib. No syncope. LH unlikely related to a cardiac cause. PLAN:    In order of problems listed above:  No further cardiac w/u      {Are you ordering a CV Procedure (e.g. stress test, cath, DCCV, TEE, etc)?   Press F2        :[id number]    Medication Adjustments/Labs and Tests Ordered: Current medicines are reviewed at length with the patient today.  Concerns regarding medicines are outlined above.  No orders of the defined types were placed in this encounter.  No orders of the defined types were placed in this encounter.   There are no Patient Instructions on file for this visit.   Signed, Stephanie Mayo, MD  06/01/2022 4:10 PM    Stephanie Mack

## 2022-06-02 ENCOUNTER — Ambulatory Visit: Payer: Medicaid Other

## 2022-06-02 ENCOUNTER — Encounter: Payer: Self-pay | Admitting: Internal Medicine

## 2022-06-02 ENCOUNTER — Ambulatory Visit: Payer: Medicaid Other | Attending: Internal Medicine | Admitting: Internal Medicine

## 2022-06-02 VITALS — BP 134/86 | HR 70 | Ht 61.0 in | Wt 153.0 lb

## 2022-06-02 DIAGNOSIS — R55 Syncope and collapse: Secondary | ICD-10-CM

## 2022-06-02 NOTE — Patient Instructions (Addendum)
Medication Instructions:   No changes  Lab Work: Not needed    Testing/Procedures: Will be schedule at [facility] has requested that you have an echocardiogram. Echocardiography is a painless test that uses sound waves to create images of your heart. It provides your doctor with information about the size and shape of your heart and how well your heart's chambers and valves are working. This procedure takes approximately one hour. There are no restrictions for this procedure. Please do NOT wear cologne, perfume, aftershave, or lotions (deodorant is allowed). Please arrive 15 minutes prior to your appointment time.   Will be mailed to you in 3 to 7 days  Your physician has recommended that you wear a holter monitor Zio  14 days . Holter monitors are medical devices that record the heart's electrical activity. Doctors most often use these monitors to diagnose arrhythmias. Arrhythmias are problems with the speed or rhythm of the heartbeat. The monitor is a small, portable device. You can wear one while you do your normal daily activities. This is usually used to diagnose what is causing palpitations/syncope (passing out).    Follow-Up: At [facility], you and your health needs are our priority.  As part of our continuing mission to provide you with exceptional heart care, we have created designated Provider Care Teams.  These Care Teams include your primary Cardiologist (physician) and Advanced Practice Providers (APPs -  Physician Assistants and Nurse Practitioners) who all work together to provide you with the care you need, when you need it.     Your next appointment:   As needed if test are normal  The format for your next appointment:   In Person  Provider:   Janina Mayo, MD    Other Instructions  [place] Monitor Instructions  Your physician has requested you wear a ZIO patch monitor for 14 days.  This is a single patch  monitor. [employer] supplies one patch monitor per enrollment. Additional stickers are not available. Please do not apply patch if you will be having a Nuclear Stress Test,  Echocardiogram, Cardiac CT, MRI, or Chest Xray during the period you would be wearing the  monitor. The patch cannot be worn during these tests. You cannot remove and re-apply the  ZIO XT patch monitor.  Your ZIO patch monitor will be mailed 3 day USPS to your address on file. It may take 3-5 days  to receive your monitor after you have been enrolled.  Once you have received your monitor, please review the enclosed instructions. Your monitor  has already been registered assigning a specific monitor serial # to you.  Billing and Patient Assistance Program Information  We have supplied [employer] with any of your insurance information on file for billing purposes. [employer] offers a sliding scale Patient Assistance Program for patients that do not have  insurance, or whose insurance does not completely cover the cost of the ZIO monitor.  You must apply for the Patient Assistance Program to qualify for this discounted rate.  To apply, please call [employer] at [phone], select option 4, select option 2, ask to apply for  Patient Assistance Program. Stephanie Mack will ask your household income, and how many people  are in your household. They will quote your out-of-pocket cost based on that information.  [employer] will also be able to set up a 8-month interest-free payment plan if needed.  Applying the monitor   Shave hair from upper left chest.  Hold abrader disc by orange tab. Rub abrader in 40 strokes over the upper left chest as  indicated in your monitor instructions.  Clean area with 4 enclosed alcohol pads. Let dry.  Apply patch as indicated in monitor instructions. Patch will be placed under collarbone on left  side of chest with arrow pointing upward.  Rub patch adhesive wings for 2 minutes. Remove white label marked "1".  Remove the white  label marked "2". Rub patch adhesive wings for 2 additional minutes.  While looking in a mirror, press and release button in center of patch. A small green light will  flash 3-4 times. This will be your only indicator that the monitor has been turned on.  Do not shower for the first 24 hours. You may shower after the first 24 hours.  Press the button if you feel a symptom. You will hear a small click. Record Date, Time and  Symptom in the Patient Logbook.  When you are ready to remove the patch, follow instructions on the last 2 pages of Patient  Logbook. Stick patch monitor onto the last page of Patient Logbook.  Place Patient Logbook in the blue and white box. Use locking tab on box and tape box closed  securely. The blue and white box has prepaid postage on it. Please place it in the mailbox as  soon as possible. Your physician should have your test results approximately 7 days after the  monitor has been mailed back to [facility]  Call Stephanie Mack at [phone] if you have questions regarding  your ZIO XT patch monitor. Call them immediately if you see an orange light blinking on your  monitor.  If your monitor falls off in less than 4 days, contact our Monitor department at [phone].  If your monitor becomes loose or falls off after 4 days call [employer] at [phone] for  suggestions on securing your monitor

## 2022-06-02 NOTE — Progress Notes (Unsigned)
Enrolled patient for a 14 Day Zio XT monitor to be mailed to patients home

## 2022-06-07 ENCOUNTER — Ambulatory Visit: Payer: Medicaid Other | Admitting: Student

## 2022-06-08 ENCOUNTER — Telehealth: Payer: Self-pay | Admitting: Student

## 2022-06-08 NOTE — Telephone Encounter (Signed)
Patient dropped off form for personal care services to be completed. Last DOS was 05/30/22. Placed in [facility].

## 2022-06-13 ENCOUNTER — Telehealth: Payer: Self-pay | Admitting: Internal Medicine

## 2022-06-13 NOTE — Telephone Encounter (Signed)
Send to monitor team

## 2022-06-13 NOTE — Telephone Encounter (Signed)
Wells, Karin Lieu M, RN Caller: Unspecified (Today, 12:35 PM) Advised patient to mail back monitor for processing if orange light blinking since she has worn it over 4 days.  Patient also brought up she was not able to go to work because she has been having chest discomfort with SOB and Headache, on and off since Friday.  She would like to speak with someone regarding these symptoms.

## 2022-06-13 NOTE — Telephone Encounter (Signed)
Spoke with patient about her reported new cardiac symptoms. Chest discomfort with SOB, headache, off and on since Friday. She reports her chest was hurting so bad she couldn't sit up. She didn't go to ED. She took an aspirin with no relief. She was ordered a monitor and echo -- both not completed -- so no cardiac test results to go off of. Advised with acute symptoms, she should go to ED for eval. She said her mom is getting something done to her car, and that she would be the one to take her to the hospital. Advised she could also call EMS for them to come assess her too. She voiced understanding.

## 2022-06-13 NOTE — Telephone Encounter (Signed)
Pt states her infant keep pulling her heart monitor off and she wants to know if she can get something else to keep it on. Please advise.

## 2022-06-14 ENCOUNTER — Encounter: Payer: Self-pay | Admitting: Internal Medicine

## 2022-06-14 NOTE — Telephone Encounter (Signed)
Per chart note, PCS forms were faxed back in February.   There is a copy in media.  Will attempt to refax.

## 2022-06-14 NOTE — Telephone Encounter (Signed)
Recent form was in green folder attached to another pt's form. Clinical info has been filled out and put in Dr. Queen Slough box. Ottis Stain, CMA

## 2022-06-14 NOTE — Telephone Encounter (Signed)
Patient is calling to check on the status of her PCS form being completed and faxed to [facility].  I did not see the form in outgoing faxes or in Dr. Elwin Sleight box.   When form was dropped off she asked for it to be faxed but was wanting to leave the fax number again because she is not sure if the orginial one is correct. Please fax form to [phone].  Patient would like for someone to call her when form has been completed and faxed.

## 2022-06-15 NOTE — Telephone Encounter (Signed)
I can not refax the printed original. A copy will not qualify.   Please just sign the new one in your box and return back to me.   I will transfer information and refax.

## 2022-06-27 ENCOUNTER — Telehealth: Payer: Self-pay

## 2022-06-27 NOTE — Telephone Encounter (Signed)
Patient returns my call.   She reports she has felt dizzy and fatigued all day. She reports she missed my call due to taking a nap.   Patient reports her sugar has been normal. When asked the most recent cbg she reported "2." She reports she has felt like passing out all day. However, she denies doing so.   Patient advised to go to an UC for evaluation.   She declined stating "they will not do anything but send me to yall."   Patient advised to monitor her cbgs and to stay well hydrated. ED precautions discussed.  Patient scheduled for tomorrow.

## 2022-06-27 NOTE — Telephone Encounter (Signed)
Patient LVM on nurse line requesting to schedule an apt.   She reports she she feels fatigued and dizzy from her "type 2 diabetes."   She is also requesting to pick up a copy of PCS form.   I attempted to call patient back, however no answer.   I placed a copy of PCS form up front for pick up.

## 2022-06-28 ENCOUNTER — Encounter: Payer: Self-pay | Admitting: Student

## 2022-06-28 ENCOUNTER — Ambulatory Visit (INDEPENDENT_AMBULATORY_CARE_PROVIDER_SITE_OTHER): Payer: Medicaid Other | Admitting: Student

## 2022-06-28 VITALS — BP 124/72 | HR 82 | Ht 61.0 in | Wt 157.0 lb

## 2022-06-28 DIAGNOSIS — R55 Syncope and collapse: Secondary | ICD-10-CM | POA: Diagnosis not present

## 2022-06-28 DIAGNOSIS — E119 Type 2 diabetes mellitus without complications: Secondary | ICD-10-CM | POA: Diagnosis present

## 2022-06-28 LAB — POCT GLYCOSYLATED HEMOGLOBIN (HGB A1C): HbA1c, POC (controlled diabetic range): 6 % (ref 0.0–7.0)

## 2022-06-28 LAB — GLUCOSE, POCT (MANUAL RESULT ENTRY): POC Glucose: 147 mg/dl — AB (ref 70–99)

## 2022-06-28 NOTE — Assessment & Plan Note (Signed)
CBG 147. F/u with PCP for A1c.

## 2022-06-28 NOTE — Assessment & Plan Note (Signed)
Patient is well-appearing at today's visit.   She ambulates unassisted and has no focal neurologic deficits. Vital signs are stable and within normal limits. She clearly does not get enough caloric intake or adequate oral hydration in the day.  Per my 24-hour recall, she is only having about 800 to 900 cal/day and sugary ice tea.  Discussed drinking enough water as well as adding in snacks throughout the day. -Given her history of cardiac arrest with syncope, I stressed the importance of follow-up with cardiology and having her Zio patch and echocardiogram on 07/04/2022.  I discussed the importance of ruling out underlying structural abnormalities or arrhythmias. -She understands return precautions.

## 2022-06-28 NOTE — Patient Instructions (Signed)
Great to meet you today, Stephanie Mack.  Please go to your cardiology appointment on April 04/2022 with Dr. Harl Bowie. You also have an ultrasound of your heart scheduled that day.  Very important that you go to both of these appointments.  In the meantime, make sure that you are eating at least 3 meals a day with small snacks in between. Drink plenty of water, Gatorade, Pedialyte to ensure adequate hydration.  Please follow-up with your primary care doctor, Dr. Adah Salvage in the next month so we can see how you are doing.  Best wishes, Dr. Owens Shark
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
## 2022-06-28 NOTE — Telephone Encounter (Signed)
Form given to patient

## 2022-07-04 ENCOUNTER — Ambulatory Visit (HOSPITAL_COMMUNITY): Payer: Medicaid Other | Attending: Internal Medicine

## 2022-07-04 DIAGNOSIS — R55 Syncope and collapse: Secondary | ICD-10-CM | POA: Insufficient documentation

## 2022-07-04 LAB — ECHOCARDIOGRAM COMPLETE
Area-P 1/2: 3.99 cm2
Calc EF: 58.1 %
S' Lateral: 2.9 cm
Single Plane A2C EF: 64.1 %
Single Plane A4C EF: 54.2 %

## 2022-07-18 ENCOUNTER — Ambulatory Visit: Payer: Medicaid Other | Admitting: Student

## 2022-07-27 ENCOUNTER — Telehealth: Payer: Self-pay | Admitting: Student

## 2022-07-27 NOTE — Telephone Encounter (Signed)
Stephanie Mack dropped off form at front desk for [name].  Verified that patient section of form has been completed.  Last DOS/WCC with PCP was 06/28/2022.  Placed form in green team folder to be completed by clinical staff.  Amy Shaune Spittle

## 2022-07-28 NOTE — Telephone Encounter (Signed)
Reviewed form and placed in PCP's for completion.  .Khalif Stender R Xadrian Craighead, CMA  

## 2022-08-08 ENCOUNTER — Ambulatory Visit: Payer: Medicaid Other | Admitting: Student

## 2022-08-09 NOTE — Telephone Encounter (Signed)
Form was placed in to be faxed pile but when it was filled out it said the patient would like to pick up at front.   I called patient and lvm letting patient know I had placed form at the front to be picked up.

## 2022-08-15 ENCOUNTER — Ambulatory Visit: Payer: Medicaid Other | Admitting: Student

## 2022-08-15 NOTE — Telephone Encounter (Signed)
Precepted with Dr. Lum Babe about patient's echo and PCS form application.  Session was performed to be faxed to the [place] [facility] which is provided on the application form.  Regional copy is to be faxed and patient can get a copy of the completed and signed form.

## 2022-08-18 NOTE — Telephone Encounter (Signed)
Caregiver, Thayer Ohm, came to pick up the form.  Unsure if it had been faxed yet, so made copy to be faxed and gave original to [place].    Form placed in to be faxed pile. Jone Baseman, CMA

## 2022-09-01 ENCOUNTER — Ambulatory Visit (INDEPENDENT_AMBULATORY_CARE_PROVIDER_SITE_OTHER): Payer: Medicaid Other | Admitting: Family Medicine

## 2022-09-01 VITALS — BP 147/101 | HR 89 | Wt 152.6 lb

## 2022-09-01 DIAGNOSIS — R55 Syncope and collapse: Secondary | ICD-10-CM | POA: Diagnosis present

## 2022-09-01 DIAGNOSIS — R197 Diarrhea, unspecified: Secondary | ICD-10-CM | POA: Diagnosis not present

## 2022-09-01 NOTE — Patient Instructions (Signed)
It was nice seeing you today!  Make sure you drink plenty of fluids to stay hydrated to keep up with the fluid losses.  Stay well, Littie Deeds, MD [facility] [phone]  --  Make sure to check out at the front desk before you leave today.  Please arrive at least 15 minutes prior to your scheduled appointments.  If you had blood work today, I will send you a MyChart message or a letter if results are normal. Otherwise, I will give you a call.  If you had a referral placed, they will call you to set up an appointment. Please give [place] a call if you don't hear back in the next 2 weeks.  If you need additional refills before your next appointment, please call your pharmacy first.

## 2022-09-01 NOTE — Progress Notes (Signed)
    SUBJECTIVE:   CHIEF COMPLAINT / HPI:   Stephanie Mack is a 32 y.o. female who presents today for presyncopal episode.  She reports stomach pain, diarrhea for past 3 days. Pain with pooping as well. Reports some nausea which began 5/29; no vomiting. She also began feeling shaky and dizzy with increased sleepiness when symptoms started; also has been experiencing headache. She reports a presyncopal episode on 5/29; she had just gotten up and felt light headed with darkening of her vision; did not fully lose consciousness or fall. Another lady gave her orange juice, which kept her from passing out.  Did not check glucose immediately after her syncopal event, but reports it was 58 earlier that day. Today, reports a morning reading of 68. Reports sugars normally in the 120s.  However, her glucometer is having a problem and reporting units in millimoles per liter.  Reports diet is half eating out; occasionally eating fried chicken, mashed potatoes, greens, occasional fruit and yogurts. Denies recent changes to DM medication.  PERTINENT  PMH / PSH: DM2, syncopal episodes, HTN, depression  OBJECTIVE:   BP (!) 147/101   Pulse 89   Wt 152 lb 9.6 oz (69.2 kg)   SpO2 100%   BMI 28.83 kg/m   General: Pt is well appearing and seated in chair, no acute distress. Cardiovascular: RRR, no murmurs, rubs, gallops. Pulmonary: Normal work of breathing. Lungs clear to auscultation bilaterally. Abdomen: Normal bowel sounds. Soft and nondistended in all four quadrants. Diffusely tender to palpation, especially in central abdomen; no rebound tenderness, no guarding. Neuro/Psych: Alert and oriented to person, place, event. (Time not formerly assessed.) Normal affect.  ASSESSMENT/PLAN:   1. Pre-syncope Suspect syncopal episode related to dehydration and questionable low sugars from diarrheal illness. Pt had normal echo in 07/2022. Pt also had Zio placed in 0/2024. results unknown. Low suspicion for cardiac  etiology at this time. - Encouraged increased hydration, especially while sick - Encouraged regular meals, especially while sick - new glucometer provided today  2. Diarrhea of presumed infectious origin Pt has had 3 days of nausea and diarrhea with diffuse abdominal tenderness on exam. Findings most consistent with viral gastroenteritis.  - Pt provided work note for today and tomorrow at pt's request - encouraged oral hydration - Encouraged to follow-up if symptoms do not resolve or improve in a couple weeks.   Governor Rooks, Medical Student [facility]   I was personally present and performed or re-performed the history, physical exam and medical decision making activities of this service and have verified that the service and findings are accurately documented in the student's note.  Littie Deeds, MD                  09/01/2022, 4:20 PM

## 2022-09-07 ENCOUNTER — Other Ambulatory Visit: Payer: Self-pay

## 2022-09-07 NOTE — Progress Notes (Signed)
   Stephanie Mack 1991-03-01 [id number]  Patient outreached by Alesia Banda , PharmD Candidate on 09/07/2022.  Blood Pressure Readings: Last documented ambulatory systolic blood pressure: 147 Last documented ambulatory diastolic blood pressure: 101 Does the patient have a validated home blood pressure machine?: No Will be receiving a blood pressure machine through the mail.  Medication review was performed. Is the patient taking their medications as prescribed?: No Differences from their prescribed list include: patient not taking metformin due to experiencing low blood sugars. Patient not taking sertraline due to "feeling like a zombie"  The following barriers to adherence were noted: Does the patient have cost concerns?: No Does the patient have transportation concerns?: No Does the patient need assistance obtaining refills?: No Does the patient occassionally forget to take some of their prescribed medications?: No Does the patient feel like one/some of their medications make them feel poorly?: Yes Does the patient have questions or concerns about their medications?: Yes Does the patient have a follow up scheduled with their primary care provider/cardiologist?: Yes   Interventions: Interventions Completed: Medications were reviewed, Patient was educated on medications, including indication and administration, Patient was educated on how to access home blood pressure machine  The patient has follow up scheduled:  PCP: Jerre Simon, MD   Alesia Banda, Student-PharmD

## 2022-09-08 ENCOUNTER — Ambulatory Visit: Payer: Medicaid Other | Admitting: Student

## 2022-09-12 ENCOUNTER — Ambulatory Visit (INDEPENDENT_AMBULATORY_CARE_PROVIDER_SITE_OTHER): Payer: Medicaid Other | Admitting: Family Medicine

## 2022-09-12 ENCOUNTER — Encounter: Payer: Self-pay | Admitting: Family Medicine

## 2022-09-12 VITALS — BP 128/89 | HR 79 | Ht 61.0 in | Wt 149.8 lb

## 2022-09-12 DIAGNOSIS — R4586 Emotional lability: Secondary | ICD-10-CM

## 2022-09-12 DIAGNOSIS — R1084 Generalized abdominal pain: Secondary | ICD-10-CM | POA: Diagnosis not present

## 2022-09-12 DIAGNOSIS — E119 Type 2 diabetes mellitus without complications: Secondary | ICD-10-CM | POA: Diagnosis present

## 2022-09-12 DIAGNOSIS — E876 Hypokalemia: Secondary | ICD-10-CM | POA: Diagnosis not present

## 2022-09-12 MED ORDER — ONDANSETRON 4 MG PO TBDP: 4.0000 mg | ORAL_TABLET | Freq: Three times a day (TID) | ORAL | 0 refills | Status: DC | PRN

## 2022-09-12 MED ORDER — PANTOPRAZOLE SODIUM 20 MG PO TBEC: 20.0000 mg | DELAYED_RELEASE_TABLET | Freq: Every day | ORAL | 0 refills | Status: DC

## 2022-09-12 NOTE — Assessment & Plan Note (Signed)
Patient currently not on any medications.  Reports she was diagnosed with diabetes while she was pregnant.  She has been checking her sugars at home when she feels symptomatic with hypoglycemia, review of her glucose monitor shows that sugars have not been <100 recently when she has checked. - A1c today

## 2022-09-12 NOTE — Patient Instructions (Addendum)
I think that we have a lot of different things that are going on right now contributing.  Being overwhelmed and stressed/depressed can affect a lot of your body and can cause some of your similar symptoms.  I do think that trying to figure out getting with counseling while you are doing the medical workup is also appropriate.  - I have sent in an antinausea medication (Zofran) for you to take every 8 hours as needed. - I have sent in a stomach medication called omeprazole (Protonix) that you can take once daily and see if it helps - We are getting labs to check and make sure your kidney function and electrolytes are normal  Make sure to come to your appointment with your PCP next week to keep a close eye on things.  If you have any episodes of syncope further, please immediately call the office.    Therapy and Counseling Resources Most providers on this list will take Medicaid. Patients with commercial insurance or Medicare should contact their insurance company to get a list of in network providers.  [facility] (takes children) Location 1: [address] [place], [place] [address] Location 2: [address] [place], [place] [address] [phone]   [facility] (spanish speaking therapist available)(habla espanol)(take medicare and medicaid)  [address], [place], [place] [address], [email or web address] [phone]  [facility]Psychiatry and Counseling [address] [facility] [address] [place] [place], [place] [address] [phone]  [facility]   [address] [place], [place] [address]      [phone]  [facility] (spanish available) [address]  [place], [place] [address] [phone]  [facility] (take [facility] and medicare) [address], [address]  [place], [place] [address]       [phone]     [facility] (virtual only) [phone]  [facility] [address] [place], [place] [phone]  [facility]:  [address] [place] [phone]  [facility]:  [address] [place] [place] [phone]  [facility] (under & uninsured) [address]   [place] [phone]    [email or web address]    [facility] [address]  [place]    [phone]  [facility] -[address]     Phone:  [phone]     [facility]-  [address]  [phone]   [facility] [address]      [place], [place] [phone]  [facility]: [address] [place], [place], [place]  [phone]   [facility] (Spanish therapist) [email or web address]  [address] [place]  [address]   [place] [address]    [phone]    [facility]   [address],  [place], [place]  [phone]   [facility]  [address] [place]  [phone]  [facility]  [address] [place], [place]        [phone]  Open Access/Walk In Clinic under & uninsured  [facility]  [address] [place], [place] [phone] Crisis [phone]  [facility] [address],  (Spanish)   [address], [place]: [phone]) 8:30 - 12; 1 - 2:30  [facility] [employer],  [address] [place], [place]    [phone]):8:30 - 12;  2 - 3PM  [facility] [employer],  [address]  [place]; [phone]):   Mon - Fri 8 AM - 5 PM  [facility] [address] [place]  MWF 12:30 to 3:00 or call to schedule an appointment  [phone]  Specific Provider options Psychology Today  [email or web address] click on find a therapist  enter your zip code left side and select or tailor a therapist for your specific need.   [facility] Provider  Directory [email or web address]  (Medicaid)   Follow all drop down to find a provider  Social Support program [facility] [phone] or PhotoSolver.pl [address], [place], [place] Recovery support and educational   24- Hour Availability:   [facility]  [address] [place], [place] [phone] Crisis [phone]  [facility] [phone]  [facility]  [phone]   [facility]  [phone] (after hours)  Therapeutic Alternative/Mobile Crisis   [phone]  [place] National Suicide Hotline  [phone])  Call 911 or go to emergency room  [facility]  [phone]);  [place] and [employer]  [phone]); [place], [place], [place], [place], [place], [place], [place]

## 2022-09-12 NOTE — Assessment & Plan Note (Signed)
Patient presented initially for stomach pain complaint and was very emotional in the room due to being overwhelmed from social stressors of having to take care of her children and also help her mother and grandmother, while not having much support for herself.  Patient also has hypertension and history of type 2 diabetes and is frustrated and upset that she is dealing with these at such a young age and also scares her as some of her family members have died from these diseases.  She is having difficulty at work secondary to her manager not recognizing her medical conditions affecting her during work.  I do feel that patient's stomach pains and mood are probably interrelated.  Patient has protective factors of her children but does not seem to have good coping mechanisms. - Patient given Medicaid therapy resources for coping mechanism assistance

## 2022-09-12 NOTE — Progress Notes (Signed)
SUBJECTIVE:   CHIEF COMPLAINT / HPI:   Stomach Pains - Patient reports acute stomach pains for the last week - Pain is accompanied by malaise and nausea - Has had presyncopal events recently and feels they are connected - Believes that a lot of this could be related to her sugars and that her symptoms are worse in the afternoons - Has had significantly decreased appetite and does not drink or eat much - Sometimes notes pain with eating food as well - Does note significant stressors as well as some depression  Stress and anxiety/depression - Patient has 2 children (ages 1 and 7) for which she does not have much support in their care - Her mother and grandmother both have significant medical conditions and patient struggles with helping care for them - Patient does not feel like she has many coping mechanisms at this time - When overwhelmed, tries to go lay down by herself  PERTINENT  PMH / PSH: T2DM, HTN  OBJECTIVE:   BP 128/89   Pulse 79   Ht 5\' 1"  (1.549 m)   Wt 149 lb 12.8 oz (67.9 kg)   SpO2 99%   BMI 28.30 kg/m   Gen: Appears in emotional distress, tearful in the room during discussions CV: RRR, no m/r/g appreciated, no peripheral edema Respiratory: No respiratory distress, breathing comfortably, able to speak in full sentences Abdomen: soft, non-distended, no guarding, able to hold her child comfortably in her lap and move around the room  ASSESSMENT/PLAN:   Type 2 diabetes mellitus (HCC) Patient currently not on any medications.  Reports she was diagnosed with diabetes while she was pregnant.  She has been checking her sugars at home when she feels symptomatic with hypoglycemia, review of her glucose monitor shows that sugars have not been <100 recently when she has checked. - A1c today  Mood changes Patient presented initially for stomach pain complaint and was very emotional in the room due to being overwhelmed from social stressors of having to take care of  her children and also help her mother and grandmother, while not having much support for herself.  Patient also has hypertension and history of type 2 diabetes and is frustrated and upset that she is dealing with these at such a young age and also scares her as some of her family members have died from these diseases.  She is having difficulty at work secondary to her manager not recognizing her medical conditions affecting her during work.  I do feel that patient's stomach pains and mood are probably interrelated.  Patient has protective factors of her children but does not seem to have good coping mechanisms. - Patient given Medicaid therapy resources for coping mechanism assistance  Abdominal pain Patient reports stomach pains for the last week that are accompanied by malaise some nausea.  Has had many different symptoms recently including syncope/presyncope and abdominal pain.  The patient has a lot on her plate in regards to stressors at home and responsibilities and has been struggling with this and her medical conditions.  Do feel that patient's current anxiety/depression issues are likely contributing to overall picture.  Patient still does not have adequate oral intake and is likely making her stomach pains worse, though would need to also consider the possibility of gallstone and ulcer disease given the pain after eating.  At this time, we will trial further discussions on adequate oral intake as well as medications listed below.  We will also obtain CMP today to  monitor hypokalemia and ensure adequate liver and kidney function. - Discussed adequate hydration and nutrition at length - Zofran ODT as needed - Protonix daily - Follow-up scheduled with PCP in 1 week - ER and return precautions discussed - CMP     Sie Formisano, DO [place] [facility]

## 2022-09-12 NOTE — Assessment & Plan Note (Addendum)
Patient reports stomach pains for the last week that are accompanied by malaise some nausea.  Has had many different symptoms recently including syncope/presyncope and abdominal pain.  The patient has a lot on her plate in regards to stressors at home and responsibilities and has been struggling with this and her medical conditions.  Do feel that patient's current anxiety/depression issues are likely contributing to overall picture.  Patient still does not have adequate oral intake and is likely making her stomach pains worse, though would need to also consider the possibility of gallstone and ulcer disease given the pain after eating.  At this time, we will trial further discussions on adequate oral intake as well as medications listed below.  We will also obtain CMP today to monitor hypokalemia and ensure adequate liver and kidney function. - Discussed adequate hydration and nutrition at length - Zofran ODT as needed - Protonix daily - Follow-up scheduled with PCP in 1 week - ER and return precautions discussed - CMP

## 2022-09-13 LAB — COMPREHENSIVE METABOLIC PANEL
ALT: 11 IU/L (ref 0–32)
AST: 12 IU/L (ref 0–40)
Albumin/Globulin Ratio: 1.4
Albumin: 4.3 g/dL (ref 4.0–5.0)
Alkaline Phosphatase: 59 IU/L (ref 44–121)
BUN/Creatinine Ratio: 10 (ref 9–23)
BUN: 7 mg/dL (ref 6–20)
Bilirubin Total: 0.6 mg/dL (ref 0.0–1.2)
CO2: 19 mmol/L — ABNORMAL LOW (ref 20–29)
Calcium: 9.2 mg/dL (ref 8.7–10.2)
Chloride: 106 mmol/L (ref 96–106)
Creatinine, Ser: 0.7 mg/dL (ref 0.57–1.00)
Globulin, Total: 3 g/dL (ref 1.5–4.5)
Glucose: 101 mg/dL — ABNORMAL HIGH (ref 70–99)
Potassium: 3.9 mmol/L (ref 3.5–5.2)
Sodium: 140 mmol/L (ref 134–144)
Total Protein: 7.3 g/dL (ref 6.0–8.5)
eGFR: 120 mL/min/{1.73_m2} (ref >59)

## 2022-09-13 LAB — HEMOGLOBIN A1C
Est. average glucose Bld gHb Est-mCnc: 131 mg/dL
Hgb A1c MFr Bld: 6.2 % — ABNORMAL HIGH (ref 4.8–5.6)

## 2022-09-15 ENCOUNTER — Ambulatory Visit: Payer: Medicaid Other | Admitting: Student

## 2022-09-22 ENCOUNTER — Ambulatory Visit: Payer: Medicaid Other | Admitting: Student

## 2022-09-22 ENCOUNTER — Telehealth: Payer: Self-pay | Admitting: Student

## 2022-09-22 NOTE — Telephone Encounter (Signed)
Patient's friend dropped off personal care services form to be completed Last DOS was 09/12/22. Placed in [employer].

## 2022-09-23 ENCOUNTER — Ambulatory Visit: Payer: Self-pay | Admitting: Student

## 2022-09-23 NOTE — Telephone Encounter (Signed)
Reviewed form and placed in PCP's box for completion.  .Grasiela Jonsson R Cecile Guevara, CMA  

## 2022-09-26 ENCOUNTER — Ambulatory Visit (HOSPITAL_COMMUNITY)
Admission: EM | Admit: 2022-09-26 | Discharge: 2022-09-26 | Disposition: A | Discharge status: Home / Self Care | Payer: Medicaid Other

## 2022-09-26 ENCOUNTER — Ambulatory Visit
Admission: EM | Admit: 2022-09-26 | Discharge: 2022-09-26 | Disposition: A | Discharge status: Home / Self Care | Payer: Medicaid Other

## 2022-09-26 ENCOUNTER — Emergency Department (HOSPITAL_COMMUNITY)
Admission: EM | Admit: 2022-09-26 | Discharge: 2022-09-26 | Discharge status: Against Medical Advice | Payer: Medicaid Other | Source: Home / Self Care | Attending: Emergency Medicine | Admitting: Emergency Medicine

## 2022-09-26 ENCOUNTER — Emergency Department (HOSPITAL_COMMUNITY): Payer: Medicaid Other

## 2022-09-26 ENCOUNTER — Emergency Department (HOSPITAL_COMMUNITY)
Admission: AD | Admit: 2022-09-26 | Discharge: 2022-09-26 | Discharge status: Against Medical Advice | Payer: Medicaid Other | Attending: Family Medicine | Admitting: Family Medicine

## 2022-09-26 ENCOUNTER — Encounter (HOSPITAL_COMMUNITY): Payer: Self-pay

## 2022-09-26 ENCOUNTER — Other Ambulatory Visit: Payer: Self-pay

## 2022-09-26 DIAGNOSIS — M542 Cervicalgia: Secondary | ICD-10-CM | POA: Insufficient documentation

## 2022-09-26 DIAGNOSIS — I10 Essential (primary) hypertension: Secondary | ICD-10-CM | POA: Insufficient documentation

## 2022-09-26 DIAGNOSIS — Z3202 Encounter for pregnancy test, result negative: Secondary | ICD-10-CM | POA: Insufficient documentation

## 2022-09-26 DIAGNOSIS — K429 Umbilical hernia without obstruction or gangrene: Secondary | ICD-10-CM | POA: Insufficient documentation

## 2022-09-26 DIAGNOSIS — Z5329 Procedure and treatment not carried out because of patient's decision for other reasons: Secondary | ICD-10-CM | POA: Insufficient documentation

## 2022-09-26 DIAGNOSIS — Z5321 Procedure and treatment not carried out due to patient leaving prior to being seen by health care provider: Secondary | ICD-10-CM | POA: Insufficient documentation

## 2022-09-26 DIAGNOSIS — R55 Syncope and collapse: Secondary | ICD-10-CM | POA: Insufficient documentation

## 2022-09-26 DIAGNOSIS — E119 Type 2 diabetes mellitus without complications: Secondary | ICD-10-CM | POA: Insufficient documentation

## 2022-09-26 DIAGNOSIS — R1033 Periumbilical pain: Secondary | ICD-10-CM

## 2022-09-26 DIAGNOSIS — N939 Abnormal uterine and vaginal bleeding, unspecified: Secondary | ICD-10-CM | POA: Insufficient documentation

## 2022-09-26 LAB — CBC
HCT: 40.3 % (ref 36.0–46.0)
Hemoglobin: 12.4 g/dL (ref 12.0–15.0)
MCH: 24.2 pg — ABNORMAL LOW (ref 26.0–34.0)
MCHC: 30.8 g/dL (ref 30.0–36.0)
MCV: 78.6 fL — ABNORMAL LOW (ref 80.0–100.0)
Platelets: 248 10*3/uL (ref 150–400)
RBC: 5.13 MIL/uL — ABNORMAL HIGH (ref 3.87–5.11)
RDW: 15.2 % (ref 11.5–15.5)
WBC: 6.2 10*3/uL (ref 4.0–10.5)
nRBC: 0 % (ref 0.0–0.2)

## 2022-09-26 LAB — URINALYSIS, ROUTINE W REFLEX MICROSCOPIC
Bilirubin Urine: NEGATIVE
Glucose, UA: NEGATIVE mg/dL
Ketones, ur: NEGATIVE mg/dL
Leukocytes,Ua: NEGATIVE
Nitrite: NEGATIVE
Protein, ur: 30 mg/dL — AB
RBC / HPF: >50 RBC/hpf (ref 0–5)
Specific Gravity, Urine: 1.043 — ABNORMAL HIGH (ref 1.005–1.030)
pH: 5 (ref 5.0–8.0)

## 2022-09-26 LAB — POCT PREGNANCY, URINE: Preg Test, Ur: NEGATIVE

## 2022-09-26 LAB — COMPREHENSIVE METABOLIC PANEL
ALT: 14 U/L (ref 0–44)
AST: 15 U/L (ref 15–41)
Albumin: 3.8 g/dL (ref 3.5–5.0)
Alkaline Phosphatase: 57 U/L (ref 38–126)
Anion gap: 9 (ref 5–15)
BUN: 8 mg/dL (ref 6–20)
CO2: 20 mmol/L — ABNORMAL LOW (ref 22–32)
Calcium: 9 mg/dL (ref 8.9–10.3)
Chloride: 109 mmol/L (ref 98–111)
Creatinine, Ser: 0.63 mg/dL (ref 0.44–1.00)
GFR, Estimated: >60 mL/min (ref >60)
Glucose, Bld: 88 mg/dL (ref 70–99)
Potassium: 3.4 mmol/L — ABNORMAL LOW (ref 3.5–5.1)
Sodium: 138 mmol/L (ref 135–145)
Total Bilirubin: 0.9 mg/dL (ref 0.3–1.2)
Total Protein: 7.4 g/dL (ref 6.5–8.1)

## 2022-09-26 LAB — I-STAT BETA HCG BLOOD, ED (MC, WL, AP ONLY): I-stat hCG, quantitative: <5 m[IU]/mL (ref <5)

## 2022-09-26 LAB — LIPASE, BLOOD: Lipase: 52 U/L — ABNORMAL HIGH (ref 11–51)

## 2022-09-26 MED ORDER — IOHEXOL 350 MG/ML SOLN
75.0000 mL | Freq: Once | INTRAVENOUS | Status: AC | PRN
  Administered 2022-09-26: 75 mL via INTRAVENOUS

## 2022-09-26 MED ORDER — ONDANSETRON 4 MG PO TBDP
4.0000 mg | ORAL_TABLET | Freq: Once | ORAL | Status: AC
  Administered 2022-09-26: 4 mg via ORAL
  Filled 2022-09-26: qty 1

## 2022-09-26 MED ORDER — OXYCODONE HCL 5 MG PO TABS
5.0000 mg | ORAL_TABLET | Freq: Once | ORAL | Status: AC
  Administered 2022-09-26: 5 mg via ORAL
  Filled 2022-09-26: qty 1

## 2022-09-26 NOTE — MAU Provider Note (Signed)
Event Date/Time   First Provider Initiated Contact with Patient 09/26/22 1318     S [name] is a 32 y.o. [phone] patient who presents to MAU today with complaint of abdominal pain and a cyst near her belly button. She reports upper/periumbilical pain since she delivered on 09/21/2021. She reports there is a cyst on her belly button that has also been present since she delivered last year. The cyst is very painful and itchy. She is reporting some nausea and headaches but denies vomiting or fever. She reports the pain is so bad that it makes it difficult to work, do any activities, and sleep during the night.  She also notes that she is having some vaginal bleeding that started last week. Bleeding has been intermittent and present since she received her Depo shot. She has passed a few quarter sized clots.  Patient has discussed this with her OBGYN who recommended another form of birth control, however she does not want that. She last saw her OBGYN on 09/20/2022.  O BP 133/87 (BP Location: Right Arm)   Pulse 73   Temp 98.2 F (36.8 C) (Oral)   Resp 18   Ht 5\' 2"  (1.575 m)   Wt 68.4 kg   SpO2 100%   BMI 27.60 kg/m   Physical Exam Vitals and nursing note reviewed.  Constitutional:      General: She is not in acute distress. Eyes:     Extraocular Movements: Extraocular movements intact.     Pupils: Pupils are equal, round, and reactive to light.  Cardiovascular:     Rate and Rhythm: Normal rate.  Pulmonary:     Effort: Pulmonary effort is normal. No respiratory distress.  Abdominal:     Palpations: Abdomen is soft.     Tenderness: There is abdominal tenderness in the periumbilical area.     Comments: Umbilicus with small, painful cyst, no erythema, no drainage  Genitourinary:    Comments: Scant brown blood on pad Musculoskeletal:        General: Normal range of motion.     Cervical back: Normal range of motion.  Skin:    General: Skin is warm and dry.  Neurological:      General: No focal deficit present.     Mental Status: She is alert and oriented to person, place, and time.  Psychiatric:        Mood and Affect: Mood normal.        Behavior: Behavior normal.    A Medical screening exam complete 1. Periumbilical abdominal pain   2. Vaginal bleeding   3. Pregnancy test negative    P Discharge from MAU in stable condition Vaginal bleeding 2/2 Depo- follow up outpatient with OBGYN Patient given the option of transfer to [facility] for further evaluation or seek care in outpatient facility of choice for umbilical pain. Patient opts to be evaluated at [facility]. I spoke with PA Victory Dakin who accepts patient Patient may return to MAU as needed for OB concerns  Brand Males, CNM 09/26/2022 1:54 PM

## 2022-09-26 NOTE — ED Provider Triage Note (Signed)
Emergency Medicine Provider Triage Evaluation Note  Stephanie Mack , a 32 y.o. female  was evaluated in triage.  Pt complains of syncopal episode today. Grandma reports that she was told by Fire that she had a seizure. Patient reports that she remembers being lightheaded before passing. Currently complaining of abdominal pain, periumbilical.  Review of Systems  Positive:  Negative:   Physical Exam  BP (!) 153/94   Pulse 80   Temp 98.9 F (37.2 C)   Resp 16   SpO2 100%  Gen:   Awake, no distress   Resp:  Normal effort  MSK:   Moves extremities without difficulty  Other:  Small umbilical hernia, unable to reduce as patient is sitting up and crying.   Medical Decision Making  Medically screening exam initiated at 5:20 PM.  Appropriate orders placed.  Stephanie Mack was informed that the remainder of the evaluation will be completed by another provider, this initial triage assessment does not replace that evaluation, and the importance of remaining in the ED until their evaluation is complete.  CT cervical clear. I removed c-collar.    Achille Rich, PA-C 09/26/22 1723

## 2022-09-26 NOTE — ED Notes (Signed)
Patient is being discharged from the Urgent Care and sent to the Emergency Department via private vehicle . Per L. [place], [place], patient is in need of higher level of care due to seizure, abdominal cyst, vaginal bleeding. Patient is aware and verbalizes understanding of plan of care.  Vitals:   09/26/22 1906  BP: (!) 170/106  Pulse: 80  SpO2: 98%

## 2022-09-26 NOTE — MAU Note (Addendum)
Stephanie Mack is a 32 y.o. at Unknown here in MAU reporting: she's not pregnant and has cyst in her upper abdomen that's been growing since she delivered last year.  Also reports she's having VB that began 1 week ago and she's saturating a pad and changing it hourly. LMP: unsure Onset of complaint: ongoing Pain score: 8 Vitals:   09/26/22 1236  BP: 133/87  Pulse: 73  Resp: 18  Temp: 98.2 F (36.8 C)  SpO2: 100%     FHT:NA Lab orders placed from triage:   UPT

## 2022-09-26 NOTE — ED Triage Notes (Signed)
Patient arrived from the grounds of [facility] after reported by patient weakness/syncope. Supposedly went to [facility] to have [facility]nia checked and she was sent to ED, patient didn't want to wait and walked to [facility] were she was found on ground. Patient arrived with c-collar due to fall and complains of mild neck pain. Patient alert and oriented, NAD

## 2022-09-26 NOTE — Progress Notes (Addendum)
Pt transferred to [facility] ED for further evaluation after MSE completed by D. Lodema Hong, CNM.

## 2022-09-26 NOTE — ED Triage Notes (Signed)
Pt and significant other state pt has a "cyst" on abd and c/o vaginal bleeding and went to [facility], then presented to ED but "they were rude", so pt went to urgent care; states she "had a seizure" in urgent care parking lot and EMS took pt back to ED; pt states she left ED due to wait. Informed pt urgent care is unable to treat seizures. Pt alert and very talkative.

## 2022-09-26 NOTE — ED Notes (Signed)
Spoke to patient.  Reviewed medical record.  Patient has been seen at MAU this morning and instructed to go to ED.  Reviewed with patient that these were the instructions of MAU and documentation shows MAU spoke to ED staff about her being sent to their location.  Patient says she went to ED but was told they were short staffed and lobby was full and it was suggested to come to UC.  Medical record and patient situation discussed with Dr Marlinda Mike and Dorann Ou, PA .

## 2022-09-26 NOTE — ED Notes (Signed)
Pt left. 

## 2022-09-27 ENCOUNTER — Other Ambulatory Visit: Payer: Self-pay

## 2022-09-27 ENCOUNTER — Emergency Department (HOSPITAL_COMMUNITY)
Admission: EM | Admit: 2022-09-27 | Discharge: 2022-09-27 | Disposition: A | Discharge status: Home / Self Care | Payer: Medicaid Other | Attending: Student | Admitting: Student

## 2022-09-27 ENCOUNTER — Encounter (HOSPITAL_COMMUNITY): Payer: Self-pay

## 2022-09-27 ENCOUNTER — Ambulatory Visit: Payer: Medicaid Other

## 2022-09-27 DIAGNOSIS — R1033 Periumbilical pain: Secondary | ICD-10-CM | POA: Insufficient documentation

## 2022-09-27 DIAGNOSIS — Z7984 Long term (current) use of oral hypoglycemic drugs: Secondary | ICD-10-CM | POA: Diagnosis not present

## 2022-09-27 DIAGNOSIS — Z79899 Other long term (current) drug therapy: Secondary | ICD-10-CM | POA: Insufficient documentation

## 2022-09-27 DIAGNOSIS — I10 Essential (primary) hypertension: Secondary | ICD-10-CM | POA: Diagnosis not present

## 2022-09-27 DIAGNOSIS — E1165 Type 2 diabetes mellitus with hyperglycemia: Secondary | ICD-10-CM | POA: Insufficient documentation

## 2022-09-27 DIAGNOSIS — R109 Unspecified abdominal pain: Secondary | ICD-10-CM | POA: Diagnosis present

## 2022-09-27 DIAGNOSIS — K429 Umbilical hernia without obstruction or gangrene: Secondary | ICD-10-CM

## 2022-09-27 LAB — COMPREHENSIVE METABOLIC PANEL
ALT: 13 U/L (ref 0–44)
AST: 14 U/L — ABNORMAL LOW (ref 15–41)
Albumin: 3.9 g/dL (ref 3.5–5.0)
Alkaline Phosphatase: 54 U/L (ref 38–126)
Anion gap: 8 (ref 5–15)
BUN: 10 mg/dL (ref 6–20)
CO2: 21 mmol/L — ABNORMAL LOW (ref 22–32)
Calcium: 9 mg/dL (ref 8.9–10.3)
Chloride: 109 mmol/L (ref 98–111)
Creatinine, Ser: 0.7 mg/dL (ref 0.44–1.00)
GFR, Estimated: >60 mL/min (ref >60)
Glucose, Bld: 86 mg/dL (ref 70–99)
Potassium: 3.5 mmol/L (ref 3.5–5.1)
Sodium: 138 mmol/L (ref 135–145)
Total Bilirubin: 0.4 mg/dL (ref 0.3–1.2)
Total Protein: 7.4 g/dL (ref 6.5–8.1)

## 2022-09-27 LAB — CBC WITH DIFFERENTIAL/PLATELET
Abs Immature Granulocytes: 0 10*3/uL (ref 0.00–0.07)
Basophils Absolute: 0 10*3/uL (ref 0.0–0.1)
Basophils Relative: 0 %
Eosinophils Absolute: 0 10*3/uL (ref 0.0–0.5)
Eosinophils Relative: 0 %
HCT: 40.9 % (ref 36.0–46.0)
Hemoglobin: 12.5 g/dL (ref 12.0–15.0)
Immature Granulocytes: 0 %
Lymphocytes Relative: 43 %
Lymphs Abs: 2.5 10*3/uL (ref 0.7–4.0)
MCH: 24.2 pg — ABNORMAL LOW (ref 26.0–34.0)
MCHC: 30.6 g/dL (ref 30.0–36.0)
MCV: 79.3 fL — ABNORMAL LOW (ref 80.0–100.0)
Monocytes Absolute: 0.5 10*3/uL (ref 0.1–1.0)
Monocytes Relative: 9 %
Neutro Abs: 2.8 10*3/uL (ref 1.7–7.7)
Neutrophils Relative %: 48 %
Platelets: 255 10*3/uL (ref 150–400)
RBC: 5.16 MIL/uL — ABNORMAL HIGH (ref 3.87–5.11)
RDW: 15.1 % (ref 11.5–15.5)
WBC: 5.8 10*3/uL (ref 4.0–10.5)
nRBC: 0 % (ref 0.0–0.2)

## 2022-09-27 LAB — CBG MONITORING, ED: Glucose-Capillary: 106 mg/dL — ABNORMAL HIGH (ref 70–99)

## 2022-09-27 MED ORDER — ONDANSETRON HCL 4 MG/2ML IJ SOLN
4.0000 mg | Freq: Once | INTRAMUSCULAR | Status: AC
  Administered 2022-09-27: 4 mg via INTRAVENOUS
  Filled 2022-09-27: qty 2

## 2022-09-27 MED ORDER — NAPROXEN 375 MG PO TABS: 375.0000 mg | ORAL_TABLET | Freq: Two times a day (BID) | ORAL | 0 refills | Status: DC

## 2022-09-27 MED ORDER — ACETAMINOPHEN 500 MG PO TABS: 1000.0000 mg | ORAL_TABLET | Freq: Three times a day (TID) | ORAL | 0 refills | Status: AC

## 2022-09-27 MED ORDER — KETOROLAC TROMETHAMINE 15 MG/ML IJ SOLN
15.0000 mg | Freq: Once | INTRAMUSCULAR | Status: AC
  Administered 2022-09-27: 15 mg via INTRAVENOUS
  Filled 2022-09-27: qty 1

## 2022-09-27 MED ORDER — LACTATED RINGERS IV BOLUS
1000.0000 mL | Freq: Once | INTRAVENOUS | Status: AC
  Administered 2022-09-27: 1000 mL via INTRAVENOUS

## 2022-09-27 MED ORDER — ONDANSETRON 4 MG PO TBDP: 4.0000 mg | ORAL_TABLET | Freq: Three times a day (TID) | ORAL | 0 refills | Status: DC | PRN

## 2022-09-27 NOTE — ED Provider Notes (Signed)
[place] EMERGENCY DEPARTMENT AT [facility] Provider Note   CSN: [id number] Arrival date & time: 09/26/22  1432     History  No chief complaint on file.   Delayla Hoffmaster is a 32 y.o. female.  HPI     31yo female with history of type II DM, bipolar 1, GERD, htn who presents with concern for abdominal pain, syncopal episode.  She initially presented to the [facility] today with concern for abdominal pain.  Reported that she been having periumbilical pain and appearance of a cyst in that area since last June, and notes that it has been increasingly more painful, and has itching over it at times as well.  On my history, she reports both nausea and vomiting in the last day and increased pain.  She reported at the [facility]U that she was having pain that was making it difficult for her to perform activities.  She also had been having vaginal bleeding that have been intermittent since her Depo shot.  She had a pregnancy test that was done at the [facility]U that was negative.  They discussed that she could follow-up with outpatient OB/GYN regarding her vaginal bleeding, and discussed options for evaluation for abdominal pain including outpt evaluation vs ED, and at MAU she reported wanting to go to ED and call was made to ED for notification..  Patient reports she went to go to the emergency department for her abdominal pain.  When she got to the emergency department, she was worried the wait would be long so she decided to walk to the urgent care.  When she was outside of the urgent care she felt lightheadedness, shortness of breath, and had a syncopal episode.  Reports she does have some neck pain and headache after her syncopal event and fall. Continues to have periumbilical abdominal pain. Is passing flatus.   Past Medical History:  Diagnosis Date   ADHD (attention deficit hyperactivity disorder)    Anemia    Anxiety    Bipolar 1 disorder (HCC)    Chlamydia 05/31/2010   Chronic constipation     Depression    depression   Fibroid    GERD (gastroesophageal reflux disease)    with pregnancy   Gestational diabetes    current pregnancy   Gonorrhea contact, treated    Headache    Hypertension    Infection    UTI   Pregnancy induced hypertension    previous pregnancy   Pseudocyesis 2013   Seen in [facility]U for percieved FM, abd distension. Normal exam.    Sickle cell trait (HCC)    Type 2 diabetes mellitus (HCC) 02/13/2017     Home Medications Prior to Admission medications   Medication Sig Start Date End Date Taking? Authorizing Provider  amLODipine (NORVASC) 5 MG tablet Take 1 tablet (5 mg total) by mouth at bedtime. 01/11/22   Jerre Simon, MD  ibuprofen (ADVIL) 600 MG tablet Take 1 tablet (600 mg total) by mouth every 6 (six) hours as needed for moderate pain. 01/11/22   Jerre Simon, MD  medroxyPROGESTERone (PROVERA) 10 MG tablet Take 2 tablets (20 mg total) by mouth 3 (three) times daily for 10 days. Patient not taking: Reported on 04/10/2022 01/15/22 01/25/22  Jerre Simon, MD  megestrol (MEGACE) 20 MG tablet Take 1 tablet (20 mg total) by mouth daily. 04/10/22   Carlynn Herald, CNM  metFORMIN (GLUMETZA) 1000 MG (MOD) 24 hr tablet Take 1 tablet, by mouth, after breakfast and after supper. Patient not  taking: Reported on 09/07/2022 01/11/22   Jerre Simon, MD  ondansetron (ZOFRAN-ODT) 4 MG disintegrating tablet Take 1 tablet (4 mg total) by mouth every 8 (eight) hours as needed for nausea or vomiting. 09/12/22   Lilland, Alana, DO  pantoprazole (PROTONIX) 20 MG tablet Take 1 tablet (20 mg total) by mouth daily. 09/12/22   Lilland, Alana, DO  senna-docusate (SENOKOT-S) 8.6-50 MG tablet Take 2 tablets by mouth 2 (two) times daily as needed for mild constipation. 09/23/21   Essie Hart, MD  sertraline (ZOLOFT) 50 MG tablet Take 50 mg by mouth daily. Patient not taking: Reported on 09/07/2022 08/27/21   [provider]      Allergies    Patient has no known allergies.     Review of Systems   Review of Systems  Physical Exam Updated Vital Signs BP (!) 153/94   Pulse 80   Temp 98.9 F (37.2 C)   Resp 16   SpO2 100%  Physical Exam Vitals and nursing note reviewed.  Constitutional:      General: She is not in acute distress.    Appearance: Normal appearance. She is not ill-appearing, toxic-appearing or diaphoretic.  HENT:     Head: Normocephalic.  Eyes:     Conjunctiva/sclera: Conjunctivae normal.  Cardiovascular:     Rate and Rhythm: Normal rate and regular rhythm.     Pulses: Normal pulses.     Heart sounds: Normal heart sounds.  Pulmonary:     Effort: Pulmonary effort is normal. No respiratory distress.  Abdominal:     Tenderness: There is abdominal tenderness (tender periumbilical hernia, reducible on my exam).  Musculoskeletal:        General: Tenderness (cervical spine) present. No deformity or signs of injury.     Cervical back: No rigidity.  Skin:    General: Skin is warm and dry.     Coloration: Skin is not jaundiced or pale.  Neurological:     General: No focal deficit present.     Mental Status: She is alert and oriented to person, place, and time.     ED Results / Procedures / Treatments   Labs (all labs ordered are listed, but only abnormal results are displayed) Labs Reviewed  LIPASE, BLOOD - Abnormal; Notable for the following components:      Result Value   Lipase 52 (*)    All other components within normal limits  COMPREHENSIVE METABOLIC PANEL - Abnormal; Notable for the following components:   Potassium 3.4 (*)    CO2 20 (*)    All other components within normal limits  CBC - Abnormal; Notable for the following components:   RBC 5.13 (*)    MCV 78.6 (*)    MCH 24.2 (*)    All other components within normal limits  URINALYSIS, ROUTINE W REFLEX MICROSCOPIC - Abnormal; Notable for the following components:   APPearance HAZY (*)    Specific Gravity, Urine 1.043 (*)    Hgb urine dipstick LARGE (*)    Protein,  ur 30 (*)    Bacteria, UA RARE (*)    All other components within normal limits  I-STAT BETA HCG BLOOD, ED ([facility], [facility], [facility] ONLY)    EKG EKG Interpretation  Date/Time:  Monday September 26 2022 15:12:56 EDT Ventricular Rate:  69 PR Interval:  156 QRS Duration: 84 QT Interval:  388 QTC Calculation: 415 R Axis:   25 Text Interpretation: Normal sinus rhythm with sinus arrhythmia Nonspecific T wave abnormality Abnormal ECG  When compared with ECG of 18-May-2022 17:39, No significant change since last tracing Confirmed by Alvira Monday ([id number]) on 09/27/2022 9:56:03 AM  Radiology CT Angio Chest PE W and/or Wo Contrast  Result Date: 09/26/2022 CLINICAL DATA:  Suspected pulmonary embolism, suspected hernia EXAM: CT ANGIOGRAPHY CHEST CT ABDOMEN AND PELVIS WITH CONTRAST TECHNIQUE: Multidetector CT imaging of the chest was performed using the standard protocol during bolus administration of intravenous contrast. Multiplanar CT image reconstructions and MIPs were obtained to evaluate the vascular anatomy. Multidetector CT imaging of the abdomen and pelvis was performed using the standard protocol during bolus administration of intravenous contrast. RADIATION DOSE REDUCTION: This exam was performed according to the departmental dose-optimization program which includes automated exposure control, adjustment of the mA and/or kV according to patient size and/or use of iterative reconstruction technique. CONTRAST:  75mL OMNIPAQUE IOHEXOL 350 MG/ML SOLN COMPARISON:  05/18/2022, 02/07/2016 FINDINGS: CTA CHEST FINDINGS Cardiovascular: This is a technically adequate evaluation of the pulmonary vasculature. No filling defects or pulmonary emboli. The heart is unremarkable without pericardial effusion. No evidence of thoracic aortic aneurysm or dissection. Mediastinum/Nodes: No enlarged mediastinal, hilar, or axillary lymph nodes. Thyroid gland, trachea, and esophagus demonstrate no significant findings. Lungs/Pleura: No acute  airspace disease, effusion, or pneumothorax. Central airways are patent. Musculoskeletal: No acute or destructive bony abnormalities. Reconstructed images demonstrate no additional findings. Review of the MIP images confirms the above findings. CT ABDOMEN and PELVIS FINDINGS Hepatobiliary: No focal liver abnormality is seen. No gallstones, gallbladder wall thickening, or biliary dilatation. Pancreas: Unremarkable. No pancreatic ductal dilatation or surrounding inflammatory changes. Spleen: Normal in size without focal abnormality. Adrenals/Urinary Tract: Adrenal glands are unremarkable. Kidneys are normal, without renal calculi, focal lesion, or hydronephrosis. Bladder is unremarkable. Stomach/Bowel: No bowel obstruction or ileus. Normal appendix right lower quadrant. No bowel wall thickening or inflammatory change. Vascular/Lymphatic: No significant vascular findings are present. No enlarged abdominal or pelvic lymph nodes. Reproductive: Uterus and bilateral adnexa are unremarkable. 2.6 cm dominant follicle right ovary. Other: No free fluid or free intraperitoneal gas. Small fat containing umbilical hernia, with stranding in the herniated fat which could reflect incarceration. No bowel herniation. Musculoskeletal: No acute or destructive bony abnormalities. Reconstructed images demonstrate no additional findings. Review of the MIP images confirms the above findings. IMPRESSION: 1. Small fat containing umbilical hernia. Stranding within the herniated fat may suggest superimposed incarceration. 2. No evidence of pulmonary embolus. Electronically Signed   By: Sharlet Salina M.D.   On: 09/26/2022 16:21   CT ABDOMEN PELVIS W CONTRAST  Result Date: 09/26/2022 CLINICAL DATA:  Suspected pulmonary embolism, suspected hernia EXAM: CT ANGIOGRAPHY CHEST CT ABDOMEN AND PELVIS WITH CONTRAST TECHNIQUE: Multidetector CT imaging of the chest was performed using the standard protocol during bolus administration of intravenous  contrast. Multiplanar CT image reconstructions and MIPs were obtained to evaluate the vascular anatomy. Multidetector CT imaging of the abdomen and pelvis was performed using the standard protocol during bolus administration of intravenous contrast. RADIATION DOSE REDUCTION: This exam was performed according to the departmental dose-optimization program which includes automated exposure control, adjustment of the mA and/or kV according to patient size and/or use of iterative reconstruction technique. CONTRAST:  75mL OMNIPAQUE IOHEXOL 350 MG/ML SOLN COMPARISON:  05/18/2022, 02/07/2016 FINDINGS: CTA CHEST FINDINGS Cardiovascular: This is a technically adequate evaluation of the pulmonary vasculature. No filling defects or pulmonary emboli. The heart is unremarkable without pericardial effusion. No evidence of thoracic aortic aneurysm or dissection. Mediastinum/Nodes: No enlarged mediastinal, hilar, or axillary lymph nodes. Thyroid  gland, trachea, and esophagus demonstrate no significant findings. Lungs/Pleura: No acute airspace disease, effusion, or pneumothorax. Central airways are patent. Musculoskeletal: No acute or destructive bony abnormalities. Reconstructed images demonstrate no additional findings. Review of the MIP images confirms the above findings. CT ABDOMEN and PELVIS FINDINGS Hepatobiliary: No focal liver abnormality is seen. No gallstones, gallbladder wall thickening, or biliary dilatation. Pancreas: Unremarkable. No pancreatic ductal dilatation or surrounding inflammatory changes. Spleen: Normal in size without focal abnormality. Adrenals/Urinary Tract: Adrenal glands are unremarkable. Kidneys are normal, without renal calculi, focal lesion, or hydronephrosis. Bladder is unremarkable. Stomach/Bowel: No bowel obstruction or ileus. Normal appendix right lower quadrant. No bowel wall thickening or inflammatory change. Vascular/Lymphatic: No significant vascular findings are present. No enlarged abdominal  or pelvic lymph nodes. Reproductive: Uterus and bilateral adnexa are unremarkable. 2.6 cm dominant follicle right ovary. Other: No free fluid or free intraperitoneal gas. Small fat containing umbilical hernia, with stranding in the herniated fat which could reflect incarceration. No bowel herniation. Musculoskeletal: No acute or destructive bony abnormalities. Reconstructed images demonstrate no additional findings. Review of the MIP images confirms the above findings. IMPRESSION: 1. Small fat containing umbilical hernia. Stranding within the herniated fat may suggest superimposed incarceration. 2. No evidence of pulmonary embolus. Electronically Signed   By: Sharlet Salina M.D.   On: 09/26/2022 16:21   CT Head Wo Contrast  Result Date: 09/26/2022 CLINICAL DATA:  Head trauma, intracranial venous injury suspected; Neck trauma (Age >= 65y) EXAM: CT HEAD WITHOUT CONTRAST CT CERVICAL SPINE WITHOUT CONTRAST TECHNIQUE: Multidetector CT imaging of the head and cervical spine was performed following the standard protocol without intravenous contrast. Multiplanar CT image reconstructions of the cervical spine were also generated. RADIATION DOSE REDUCTION: This exam was performed according to the departmental dose-optimization program which includes automated exposure control, adjustment of the mA and/or kV according to patient size and/or use of iterative reconstruction technique. COMPARISON:  None Available. FINDINGS: CT HEAD FINDINGS Brain: No evidence of acute infarction, hemorrhage, hydrocephalus, extra-axial collection or mass lesion/mass effect. Vascular: No hyperdense vessel identified. Skull: No acute fracture. Sinuses/Orbits: Clear sinuses.  No acute orbital findings. Other: No mastoid effusions. CT CERVICAL SPINE FINDINGS Alignment: Straightening.  No substantial sagittal subluxation. Skull base and vertebrae: Vertebral body heights are maintained. No evidence of acute fracture. Soft tissues and spinal canal:  No prevertebral fluid or swelling. No visible canal hematoma. Disc levels:  No significant focal bony degenerative change. Upper chest: Visualized lung apices are clear. IMPRESSION: No acute abnormality intracranially or in the cervical spine. Electronically Signed   By: Feliberto Harts M.D.   On: 09/26/2022 16:18   CT Cervical Spine Wo Contrast  Result Date: 09/26/2022 CLINICAL DATA:  Head trauma, intracranial venous injury suspected; Neck trauma (Age >= 65y) EXAM: CT HEAD WITHOUT CONTRAST CT CERVICAL SPINE WITHOUT CONTRAST TECHNIQUE: Multidetector CT imaging of the head and cervical spine was performed following the standard protocol without intravenous contrast. Multiplanar CT image reconstructions of the cervical spine were also generated. RADIATION DOSE REDUCTION: This exam was performed according to the departmental dose-optimization program which includes automated exposure control, adjustment of the mA and/or kV according to patient size and/or use of iterative reconstruction technique. COMPARISON:  None Available. FINDINGS: CT HEAD FINDINGS Brain: No evidence of acute infarction, hemorrhage, hydrocephalus, extra-axial collection or mass lesion/mass effect. Vascular: No hyperdense vessel identified. Skull: No acute fracture. Sinuses/Orbits: Clear sinuses.  No acute orbital findings. Other: No mastoid effusions. CT CERVICAL SPINE FINDINGS Alignment: Straightening.  No substantial  sagittal subluxation. Skull base and vertebrae: Vertebral body heights are maintained. No evidence of acute fracture. Soft tissues and spinal canal: No prevertebral fluid or swelling. No visible canal hematoma. Disc levels:  No significant focal bony degenerative change. Upper chest: Visualized lung apices are clear. IMPRESSION: No acute abnormality intracranially or in the cervical spine. Electronically Signed   By: Feliberto Harts M.D.   On: 09/26/2022 16:18    Procedures Procedures    Medications Ordered in  ED Medications  oxyCODONE (Oxy IR/ROXICODONE) immediate release tablet 5 mg (5 mg Oral Given 09/26/22 1600)  ondansetron (ZOFRAN-ODT) disintegrating tablet 4 mg (4 mg Oral Given 09/26/22 1601)  iohexol (OMNIPAQUE) 350 MG/ML injection 75 mL (75 mLs Intravenous Contrast Given 09/26/22 1614)    ED Course/ Medical Decision Making/ A&P                              31yo female with history of type II DM, bipolar 1, GERD, htn who presents with concern for abdominal pain, syncopal episode.  DDx includes appendicitis, pancreatitis, cholecystitis, pyelonephritis, nephrolithiasis, diverticulitis, SBO, incarcerated hernia, PID, ovarian torsion, ectopic pregnancy, and tuboovarian abscess.  DDx for syncope includes arrhythmia, seizure, PE, dehydration, vasovagal.   Ordered ECG and personally evaluated this with normal sinus rhythm, no acute changes in comparison to previous.  Reviewed pregnancy test from [facility] which is negative, low suspicion for ectopic pregnancy, and given no significant lower abdominal pain or tenderness doubt ovarian torsion, TOA, PID.   Given presence of dyspnea prior to syncopal episode ordered CT PE study.  Ordered CT abdomen pelvis given hernia and pain.  Ordered CT head and CSpine given syncope with fall and pain. Ordered labs including CBC/CMP/lipase/UA.  I had seen and obtained history and exam with Morrie Sheldon in Triage.  We discussed plan for imaging as we await for a room.  She had her testing completed however eloped prior to getting a room.  After she eloped I reviewed labs and imaging and called her hte next day to discuss results.  Labs evaluated show no anemia, no leukocytosis, bicarb mildly low similar to prior without anion gap, no ketones, no signs of DKA, mild hypokalemia (recommend eating bananas) but no significant electrolyte abnormalities to cause syncope. Lipase not within range consistent with pancreatitis. No sign of UTI, blood in setting of vaginal bleeding.    CT  head, cspine without traumatic injury. CT PE study without PE.    CT abdomen pelvis shows small fat containing hernia, some stranding to suggest incarceration--she was reducible on exam however may have intermittent incarceration given this appearance-called to discuss her fat containing hernia may be painful and recommend follow up with [facility] and provided her with the number to call.  She otherwise does not have signs of bowel obstruction or bowel incarceration or need for emergent surgery.  Suspect syncope in setting of pain, dehydration, vasovagal.  Discussed we would have liked to reevaluate her and discuss these results while she was in the emergency department however  I wanted to call to follow up to make sure she had an understanding of the results and plan for outpatient surgery follow up with [facility].        Final Clinical Impression(s) / ED Diagnoses Final diagnoses:  Syncope, unspecified syncope type  Umbilical hernia without obstruction and without gangrene    Rx / DC Orders ED Discharge Orders     None  Alvira Monday, MD 09/27/22 1030

## 2022-09-27 NOTE — ED Provider Notes (Signed)
[place] EMERGENCY DEPARTMENT AT [facility] Provider Note  CSN: [id number] Arrival date & time: 09/27/22 1093  Chief Complaint(s) Abdominal Pain and Hypoglycemia  HPI Stephanie Mack is a 32 y.o. female with PMH bipolar 1, T2DM, GERD, HTN who presents emergency room for evaluation of abdominal pain and hyperglycemia.  Patient to the emergency department yesterday after initially presenting to the MAU with a negative pregnancy test.  She received blood work and a CT PE, CT abdomen pelvis performed from the lobby but eloped prior to being seen by provider.  Her imaging revealed a fat-containing hernia with some evidence of incarceration and patient received a call from Dr. Dalene Seltzer to follow-up outpatient with general surgery of which she has an appointment on 10/13/2022.  Her pain has been persistent and reportedly has not been eating.  She states that she had a syncopal episode at a doctor's office this morning where she was found to be "unresponsive" but was able to drink juice.  Sugar was 65 in the office but has improved here and is 105.  Here in the emergency room and she endorses umbilical abdominal pain but denies chest pain, shortness of breath, headache, fever or other systemic symptoms.   Past Medical History Past Medical History:  Diagnosis Date   ADHD (attention deficit hyperactivity disorder)    Anemia    Anxiety    Bipolar 1 disorder (HCC)    Chlamydia 05/31/2010   Chronic constipation    Depression    depression   Fibroid    GERD (gastroesophageal reflux disease)    with pregnancy   Gestational diabetes    current pregnancy   Gonorrhea contact, treated    Headache    Hypertension    Infection    UTI   Pregnancy induced hypertension    previous pregnancy   Pseudocyesis 2013   Seen in MAU for percieved FM, abd distension. Normal exam.    Sickle cell trait (HCC)    Type 2 diabetes mellitus (HCC) 02/13/2017   Patient Active Problem List   Diagnosis  Date Noted   Mood changes 09/12/2022   Syncope 06/28/2022   Status post repeat low transverse cesarean section 09/21/2021   Scalp lesion 07/25/2019   Herpes infection 06/23/2017   Type 2 diabetes mellitus (HCC) 02/13/2017   Bipolar affective disorder, depressed, severe (HCC)    ADHD (attention deficit hyperactivity disorder) 06/19/2015   Bipolar 1 disorder (HCC) 06/19/2015   History of classical cesarean section 01/07/2015   Hx of preeclampsia, prior pregnancy, currently pregnant    Essential hypertension, benign 11/27/2013   Adjustment disorder with depressed mood 08/13/2013   Abdominal pain 12/04/2012   Pseudocyesis    Home Medication(s) Prior to Admission medications   Medication Sig Start Date End Date Taking? Authorizing Provider  amLODipine (NORVASC) 5 MG tablet Take 1 tablet (5 mg total) by mouth at bedtime. 01/11/22   Jerre Simon, MD  ibuprofen (ADVIL) 600 MG tablet Take 1 tablet (600 mg total) by mouth every 6 (six) hours as needed for moderate pain. 01/11/22   Jerre Simon, MD  medroxyPROGESTERone (PROVERA) 10 MG tablet Take 2 tablets (20 mg total) by mouth 3 (three) times daily for 10 days. Patient not taking: Reported on 04/10/2022 01/15/22 01/25/22  Jerre Simon, MD  megestrol (MEGACE) 20 MG tablet Take 1 tablet (20 mg total) by mouth daily. 04/10/22   Carlynn Herald, CNM  metFORMIN (GLUMETZA) 1000 MG (MOD) 24 hr tablet Take 1 tablet, by mouth, after breakfast  and after supper. Patient not taking: Reported on 09/07/2022 01/11/22   Jerre Simon, MD  ondansetron (ZOFRAN-ODT) 4 MG disintegrating tablet Take 1 tablet (4 mg total) by mouth every 8 (eight) hours as needed for nausea or vomiting. 09/12/22   Lilland, Alana, DO  pantoprazole (PROTONIX) 20 MG tablet Take 1 tablet (20 mg total) by mouth daily. 09/12/22   Lilland, Alana, DO  senna-docusate (SENOKOT-S) 8.6-50 MG tablet Take 2 tablets by mouth 2 (two) times daily as needed for mild constipation. 09/23/21   Essie Hart,  MD  sertraline (ZOLOFT) 50 MG tablet Take 50 mg by mouth daily. Patient not taking: Reported on 09/07/2022 08/27/21   [provider]                                                                                                                                    Past Surgical History Past Surgical History:  Procedure Laterality Date   CESAREAN SECTION N/A 09/01/2012   Procedure:  Primary cesarean section with delivery of baby girl at 62.  Apgars 1/1.  ;  Surgeon: Kathreen Cosier, MD;  Location: [facility];  Service: Obstetrics;  Laterality: N/A;   CESAREAN SECTION N/A 01/07/2015   Procedure: REPEAT CESAREAN SECTION;  Surgeon: Brock Bad, MD;  Location: [facility];  Service: Obstetrics;  Laterality: N/A;   CESAREAN SECTION WITH BILATERAL TUBAL LIGATION N/A 09/21/2021   Procedure: CESAREAN SECTION WITH BILATERAL TUBAL LIGATION;  Surgeon: Essie Hart, MD;  Location: [facility];  Service: Obstetrics;  Laterality: N/A;   DILATION AND CURETTAGE OF UTERUS     NASAL SEPTUM SURGERY     WISDOM TOOTH EXTRACTION     Family History Family History  Problem Relation Age of Onset   Kidney disease Mother    Lupus Mother    Vision loss Father    Hypertension Father    Cancer Sister    Asthma Brother    Diabetes Maternal Grandfather    Heart disease Neg Hx     Social History Social History   Tobacco Use   Smoking status: Never    Passive exposure: Never   Smokeless tobacco: Never  Vaping Use   Vaping Use: Never used  Substance Use Topics   Alcohol use: No    Alcohol/week: 0.0 standard drinks of alcohol   Drug use: No   Allergies Patient has no known allergies.  Review of Systems Review of Systems  Gastrointestinal:  Positive for abdominal pain.  Neurological:  Positive for syncope.    Physical Exam Vital Signs  I have reviewed the triage vital signs BP 132/87   Pulse 71   Temp 98.7 F (37.1 C) (Oral)   Resp 18   Ht 5\' 2"  (1.575 m)   Wt 68.4 kg   SpO2 99%   BMI  27.60 kg/m   Physical Exam Vitals and nursing note reviewed.  Constitutional:  General: She is not in acute distress.    Appearance: She is well-developed.  HENT:     Head: Normocephalic and atraumatic.  Eyes:     Conjunctiva/sclera: Conjunctivae normal.  Cardiovascular:     Rate and Rhythm: Normal rate and regular rhythm.     Heart sounds: No murmur heard. Pulmonary:     Effort: Pulmonary effort is normal. No respiratory distress.     Breath sounds: Normal breath sounds.  Abdominal:     Palpations: Abdomen is soft.     Tenderness: There is abdominal tenderness in the periumbilical area.  Musculoskeletal:        General: No swelling.     Cervical back: Neck supple.  Skin:    General: Skin is warm and dry.     Capillary Refill: Capillary refill takes less than 2 seconds.  Neurological:     Mental Status: She is alert.  Psychiatric:        Mood and Affect: Mood normal.     ED Results and Treatments Labs (all labs ordered are listed, but only abnormal results are displayed) Labs Reviewed  CBG MONITORING, ED - Abnormal; Notable for the following components:      Result Value   Glucose-Capillary 106 (*)    All other components within normal limits  COMPREHENSIVE METABOLIC PANEL  CBC WITH DIFFERENTIAL/PLATELET                                                                                                                          Radiology No results found.  Pertinent labs & imaging results that were available during my care of the patient were reviewed by me and considered in my medical decision making (see MDM for details).  Medications Ordered in ED Medications  lactated ringers bolus 1,000 mL (1,000 mLs Intravenous New Bag/Given 09/27/22 1717)  ondansetron (ZOFRAN) injection 4 mg (4 mg Intravenous Given 09/27/22 1717)  ketorolac (TORADOL) 15 MG/ML injection 15 mg (15 mg Intravenous Given 09/27/22 1718)                                                                                                                                      Procedures Procedures  (including critical care time)  Medical Decision Making / ED Course   This patient presents to the ED for concern of periumbilical abdominal pain, syncope, this involves an extensive number of  treatment options, and is a complaint that carries with it a high risk of complications and morbidity.  The differential diagnosis includes hernia, obstruction, decreased p.o. intake secondary to pain, orthostatic syncope, cardiogenic syncope, vasovagal syncope  MDM: Patient seen emergency room for evaluation of abdominal pain and syncope.  Physical exam with mild tenderness near the umbilicus but is otherwise unremarkable.  Patient blood sugar appears to have improved and is 106.  Lab evaluation is largely unremarkable.  Patient given Toradol Zofran and fluids and on reevaluation her symptoms have resolved.  Patient had an extensive workup yesterday and her symptoms appear to be related to an incarcerated fat-containing hernia of which she will have outpatient follow-up for on July 11 with general surgery.  No additional syncopal episodes while here in the emergency department and syncope likely secondary to hypoglycemia today from decreased p.o. intake from pain.  With symptoms improved, patient is able to tolerate p.o. and she will be discharged with Zofran, Naprosyn and instructions to hydrate aggressively.  She is given return precautions which she voiced understanding and at this time does not meet inpatient criteria for admission.  She was discharged with outpatient follow-up   Additional history obtained: -Additional history obtained from boyfriend -External records from outside source obtained and reviewed including: Chart review including previous notes, labs, imaging, consultation notes   Lab Tests: -I ordered, reviewed, and interpreted labs.   The pertinent results include:   Labs Reviewed  CBG  MONITORING, ED - Abnormal; Notable for the following components:      Result Value   Glucose-Capillary 106 (*)    All other components within normal limits  COMPREHENSIVE METABOLIC PANEL  CBC WITH DIFFERENTIAL/PLATELET       Medicines ordered and prescription drug management: Meds ordered this encounter  Medications   lactated ringers bolus 1,000 mL   ondansetron (ZOFRAN) injection 4 mg   ketorolac (TORADOL) 15 MG/ML injection 15 mg    -I have reviewed the patients home medicines and have made adjustments as needed  Critical interventions none   Cardiac Monitoring: The patient was maintained on a cardiac monitor.  I personally viewed and interpreted the cardiac monitored which showed an underlying rhythm of: NSR, no evidence of dysrhythmia  Social Determinants of Health:  Factors impacting patients care include: none   Reevaluation: After the interventions noted above, I reevaluated the patient and found that they have :improved  Co morbidities that complicate the patient evaluation  Past Medical History:  Diagnosis Date   ADHD (attention deficit hyperactivity disorder)    Anemia    Anxiety    Bipolar 1 disorder (HCC)    Chlamydia 05/31/2010   Chronic constipation    Depression    depression   Fibroid    GERD (gastroesophageal reflux disease)    with pregnancy   Gestational diabetes    current pregnancy   Gonorrhea contact, treated    Headache    Hypertension    Infection    UTI   Pregnancy induced hypertension    previous pregnancy   Pseudocyesis 2013   Seen in MAU for percieved FM, abd distension. Normal exam.    Sickle cell trait (HCC)    Type 2 diabetes mellitus (HCC) 02/13/2017      Dispostion: I considered admission for this patient, but at this time she does not meet inpatient criteria for admission she is safe for discharge with outpatient follow-up     Final Clinical Impression(s) / ED Diagnoses Final diagnoses:  None      @  Charlyne Petrin, MD 09/27/22 [phone]

## 2022-09-27 NOTE — Progress Notes (Deleted)
    SUBJECTIVE:   CHIEF COMPLAINT / HPI:   Events yesterday: Presented to MAU due to vaginal bleeding and "cyst" on abdomen, tells me "they were rude" and sent her to the ED after MSE.  Patient left the ED due to wait times and walked to UC. En route to UC developed dyspnea and syncope and ??seizure, was found down in parking lot and transported back to the ED by EMS. Again left the ED after incomplete workup:  CT Abd with small fat containing umbilical hernia with stranding within the herniated fat "may suggest superimposed incarceration." CTA w/o PE. CT Head and C spine negative  UA with 30 protein and >50RBC but patient is having vaginal bleeding as above hCG neg Hgb 12.4 Lipase 52 Went back to UC where she was again told to go to the ED.   Syncope  ?Seizure ECG with shallow TWI in lead V3 and V4, on review of prior ECG, the inversion in V3 was present previously but the inversion in V4 is new.  Was seen by Dr. Wyline Mood, cardiology in Feb due to a similar episode where family members administered CPR, per EMS run sheet, CPR was likely not indicated. It seems this is a recurring issue.   She left AMA ffrom the hospital at that time.  Had a normal echo in April, Zio patch was ordered but not completed yet.   Vaginal bleeding:  Is on depo. Last OB/Gyn visit was 6/18. Dr mentioned transitioning to other options but she declines this.   PERTINENT  PMH / PSH: Bipolar Disorsder  OBJECTIVE:   There were no vitals taken for this visit.  ***  ASSESSMENT/PLAN:   No problem-specific Assessment & Plan notes found for this encounter.     Eliezer Mccoy, MD [facility]

## 2022-09-27 NOTE — ED Triage Notes (Addendum)
Patient BIB EMS from OBGYN office for hypoglycemia. Patient's became drowsy and minimally responsive in office. BGL in office 65. Patient given orange juice and came up to 105. EMS BGL 155. Patient also reports nausea and abdominal pain.  Patient seen yesterday for seizures.  Patient is A&Ox4 and NAD.

## 2022-09-28 NOTE — Telephone Encounter (Signed)
Called and verified patient before proceeding with conversation.  During conversation patient states she has not been doing too well and has had multiple  seizures and 2 episodes of syncope.  Recently seen in the ED and urgent care where she was informed that her blood sugar was low to 65. Patient reported being in coma yesterday due to this low blood sugar.  She also reports later in the evening yesterday her blood sugars was in the 160s.  Patient expressed desire to have her diabetes medications adjusted.  Patient stated she has never seen a neurologist for syncope or seizures.  Also discussed need for PCS form completed.  Informed patient being that this is the fourth time this form is being completed she would need to be seen in person to further evaluate her overall medical condition. Told patient she will get a call from the nurse to schedule an appointment.  Emphasized need for patient to make this appointment given that she has had a lot of no-shows in the last 3-6 months.  She verbalized understanding and agreeable to plan.

## 2022-09-29 NOTE — Telephone Encounter (Signed)
Called patient and scheduled appointment for next week.   Thanks [employer]

## 2022-09-30 ENCOUNTER — Ambulatory Visit (INDEPENDENT_AMBULATORY_CARE_PROVIDER_SITE_OTHER): Payer: Medicaid Other | Admitting: Student

## 2022-09-30 VITALS — BP 142/92 | HR 89 | Ht 62.0 in | Wt 153.4 lb

## 2022-09-30 DIAGNOSIS — I10 Essential (primary) hypertension: Secondary | ICD-10-CM

## 2022-09-30 DIAGNOSIS — R4586 Emotional lability: Secondary | ICD-10-CM | POA: Diagnosis not present

## 2022-09-30 DIAGNOSIS — K429 Umbilical hernia without obstruction or gangrene: Secondary | ICD-10-CM | POA: Diagnosis not present

## 2022-09-30 NOTE — Progress Notes (Unsigned)
    SUBJECTIVE:   CHIEF COMPLAINT / HPI:   Umbilical hernia Patient seen in ED and urgent care multiple times on 6/24-6/25 for unwitnessed syncope and abdominal pain.  Labs nonconcerning.  Imaging consistent with possibly intermittently incarcerated umbilical hernia and she has an appointment with general surgery on July 11.  States she had an unwitnessed syncopal episode on 6/24 which she thought may be due to hypoglycemia although I cannot find any abnormal blood sugars documented. Today states she has been able to eat some food, abdominal pain is intermittent and not currently severe.  Denies any vomiting.  No further syncopal episodes.  Feeling sad States she is feeling very sad and has a lot of general anxiety about life (caring for her 2 kids and parent) and about her health. She expressed concern about hypertension and her blood sugars, stating she has type 2 diabetes though A1c earlier this month was 6.2 not on medication.  States she does not want to take any medications for her mood as they have made her feel like a zombie in the past.  She does not have any one she can talk with about her feelings and is not interested in seeing a mental health professional.   PHQ-9 negative for #9 which patient verbally confirms. Initially she says nothing brings her enjoyment then says she sometimes enjoys coloring.   HTN Not currently taking any BP medications.  Was previously prescribed amlodipine 5 mg and says she has not taken it in over a year because "it does not work " and she does not want to start any BP lowering medications today.   PERTINENT  PMH / PSH: bipolar disorder, anxiety and depression, HTN  OBJECTIVE:   BP (!) 142/92   Pulse 89   Ht 5\' 2"  (1.575 m)   Wt 153 lb 6.4 oz (69.6 kg)   BMI 28.06 kg/m    General: NAD, pleasant, able to participate in exam Cardiac: Well perfused  Respiratory: normal effort Abdomen: Bowel sounds present, nondistended, soft, mild diffuse TTP  without gaurding Neuro: alert, no obvious focal deficits Psych: lying in fetal position during most of visit (not d/t pain), tearful, soft spoken  ASSESSMENT/PLAN:   Essential hypertension, benign Patient refuses BP lowering medication. Will continue to check BP at future visits and discuss importance of BP control.   Umbilical hernia without obstruction and without gangrene   Umbilical hernia noted on imaging in ED with concern for intermittent incarceration. Abdominal exam reassuring today and reassuring the she is able to eat and drink. -f/u with general surgery at scheduled appointment  Mood changes On chart review previously took Abilify and followed with psychiatrist.  Today she refuses medication and has no interest in seeing psychiatry or therapy.  Does have bipolar disorder on problem list. She was very tearful and appeared childlike during the visit. List of therapy/emergency mental health resources provided.  We set a goal of finding 1 person she can talk to about how she is feeling and getting an adult coloring book to use when feeling overwhelmed.  She has an appointment scheduled with PCP on 7/2 and with me (per her request) on 7/22.     Dr. Erick Alley, DO [facility]

## 2022-09-30 NOTE — Patient Instructions (Addendum)
It was great to see you! Thank you for allowing me to participate in your care!    Our plans for today:  - Goals: color in adult coloring book when feeling down or anxious. Find at least 1 person you can talk with about your feelings - Return in 1 month for a follow up visit    Take care and seek immediate care sooner if you develop any concerns.   Dr. Erick Alley, DO [facility]   Therapy and Counseling Resources Most providers on this list will take Medicaid. Patients with commercial insurance or Medicare should contact their insurance company to get a list of in network providers.  [facility] (spanish speaking therapist available)(habla espanol)(take medicare and medicaid)  [address], [place], [place] [address], [email or web address] [phone]  [facility]iatry [facility] Counseling [address] [facility] [place], [place] [address] [phone]  [facility]   [address] [place], [place] [address]      [phone]  [facility] (spanish available) [address]  [place], [place] [address] [phone]  [facility] (take [facility] and medicare) [address], [address]  [place], [place] [address]       [phone]     [facility] (virtual only) [phone]  [facility] [address] [place], [place] [phone]  [facility]:  [address] [place] [phone]  [facility]:  [address] [place], [place] [phone]  [facility] (under & uninsured) [address]   [place] [phone]    [email or web address]    [facility] [address]  [place]    [phone]  [facility] -[address]     Phone:  [phone]     [facility]-  [address]  [phone]   [facility]  [address]      [place], [place] [phone]  [facility]: [address] [place], [place], [place]  [phone]   [facility] (Spanish therapist) [email or web address]  [address] [place]  [address]   [place] [address]    [phone]    [facility]   [address],  [place], [place]  [phone]   [facility]  [address] [place]  [phone]  [facility]  [address] [place], [place]        [phone]  Open Access/Walk In Clinic under & uninsured  [facility]  [address] [place], [place] [phone] Crisis [phone]  [facility] [place],  (Spanish)   [address], [place]: [phone]) 8:30 - 12; 1 - 2:30  [facility] [employer],  [address] [place], [place]    [phone]):8:30 - 12; 2 - 3PM  [facility] [employer],  [address]  [place]; [phone]):   Mon - Fri 8 AM - 5 PM  [facility] [address] [place]  MWF 12:30 to 3:00 or call to schedule an appointment  [phone]  Specific Provider options Psychology Today  [email or web address] click on find a therapist  enter your zip code left side and select or tailor a therapist for your specific need.   [facility] Provider Directory [email or web address]  (Medicaid)   Follow all drop down to  find a provider  Social Support program [facility] [place] or [employer] [address], [place], [place] Recovery support and educational   24- Hour Availability:   [facility]  [address] [place], [place] [phone] Crisis [phone]  [facility] [phone]  [facility]  [phone]   [facility]  [phone] (after  hours)  Therapeutic Alternative/Mobile Crisis   [phone]  [place] National Suicide Hotline  [phone] Len Childs)  Call 911 or go to emergency room  [facility]  [phone]);  [place] and [employer]  [phone]); [place], [place], [place], [place], [place], [place], [place]

## 2022-10-01 NOTE — Progress Notes (Incomplete)
    SUBJECTIVE:   CHIEF COMPLAINT / HPI:   Umbilical hernia Patient seen in ED and urgent care multiple times on 6/24-6/25 for unwitnessed syncope and abdominal pain.  Labs nonconcerning.  Imaging consistent with possibly intermittently incarcerated umbilical hernia and she has an appointment with general surgery on July 11.  States she had an unwitnessed syncopal episode on 6/24 which she thought may be due to hypoglycemia although I cannot find any abnormal blood sugars documented. Today states she has been able to eat some food, abdominal pain is intermittent and not currently severe.  Denies any vomiting.  No further syncopal episodes.  Anxiety and depression She has a lot of general anxiety about life and about her health, very concerned about hypertension and her blood sugars, stating she has type 2 diabetes though A1c earlier this month was 6.2 not on medication.  States she does not want to take any medications for her mood as they have made her feel like a zombie in the past.  She does not have any one she can talk with about her feelings and is not interested in seeing a therapist.   PERTINENT  PMH / PSH: bipolar disorder, T2DM, GERD, HTN  OBJECTIVE:   BP (!) 142/92   Pulse 89   Ht 5\' 2"  (1.575 m)   Wt 153 lb 6.4 oz (69.6 kg)   BMI 28.06 kg/m    General: NAD, pleasant, able to participate in exam Cardiac: RRR, no murmurs. Respiratory: CTAB, normal effort, No wheezes, rales or rhonchi Abdomen: Bowel sounds present, nontender, nondistended, no hepatosplenomegaly. Extremities: no edema or cyanosis. Skin: warm and dry, no rashes noted Neuro: alert, no obvious focal deficits Psych: Normal affect and mood  ASSESSMENT/PLAN:   No problem-specific Assessment & Plan notes found for this encounter.     Dr. Erick Alley, DO [facility]    {    This will disappear when note is signed, click to select method of visit    :1}

## 2022-10-02 DIAGNOSIS — K429 Umbilical hernia without obstruction or gangrene: Secondary | ICD-10-CM | POA: Insufficient documentation

## 2022-10-02 NOTE — Assessment & Plan Note (Signed)
Patient refuses BP lowering medication. Will continue to check BP at future visits and discuss importance of BP control.

## 2022-10-02 NOTE — Assessment & Plan Note (Addendum)
On chart review previously took Abilify and followed with psychiatrist.  Today she refuses medication and has no interest in seeing psychiatry or therapy.  Does have bipolar disorder on problem list. She was very tearful and appeared childlike during the visit. List of therapy/emergency mental health resources provided.  We set a goal of finding 1 person she can talk to about how she is feeling and getting an adult coloring book to use when feeling overwhelmed.  She has an appointment scheduled with PCP on 7/2 and with me (per her request) on 7/22.

## 2022-10-02 NOTE — Assessment & Plan Note (Addendum)
Umbilical hernia noted on imaging in ED with concern for intermittent incarceration. Abdominal exam reassuring today and reassuring the she is able to eat and drink. -f/u with general surgery at scheduled appointment

## 2022-10-04 ENCOUNTER — Ambulatory Visit: Payer: Medicaid Other | Admitting: Student

## 2022-10-05 ENCOUNTER — Other Ambulatory Visit: Payer: Self-pay | Admitting: Internal Medicine

## 2022-10-06 LAB — COMPLETE METABOLIC PANEL WITH GFR
AG Ratio: 1.5 (calc) (ref 1.0–2.5)
ALT: 12 U/L (ref 6–29)
AST: 12 U/L (ref 10–30)
Albumin: 4.1 g/dL (ref 3.6–5.1)
Alkaline phosphatase (APISO): 54 U/L (ref 31–125)
BUN: 9 mg/dL (ref 7–25)
CO2: 20 mmol/L (ref 20–32)
Calcium: 8.8 mg/dL (ref 8.6–10.2)
Chloride: 110 mmol/L (ref 98–110)
Creat: 0.53 mg/dL (ref 0.50–0.96)
Globulin: 2.8 g/dL (calc) (ref 1.9–3.7)
Glucose, Bld: 102 mg/dL — ABNORMAL HIGH (ref 65–99)
Potassium: 4.1 mmol/L (ref 3.5–5.3)
Sodium: 140 mmol/L (ref 135–146)
Total Bilirubin: 0.4 mg/dL (ref 0.2–1.2)
Total Protein: 6.9 g/dL (ref 6.1–8.1)
eGFR: 128 mL/min/{1.73_m2} (ref >=60)

## 2022-10-06 LAB — CBC
HCT: 40.2 % (ref 35.0–45.0)
Hemoglobin: 12.6 g/dL (ref 11.7–15.5)
MCH: 24.4 pg — ABNORMAL LOW (ref 27.0–33.0)
MCHC: 31.3 g/dL — ABNORMAL LOW (ref 32.0–36.0)
MCV: 77.9 fL — ABNORMAL LOW (ref 80.0–100.0)
MPV: 10.9 fL (ref 7.5–12.5)
Platelets: 261 10*3/uL (ref 140–400)
RBC: 5.16 10*6/uL — ABNORMAL HIGH (ref 3.80–5.10)
RDW: 14.3 % (ref 11.0–15.0)
WBC: 5.3 10*3/uL (ref 3.8–10.8)

## 2022-10-06 LAB — VITAMIN D 25 HYDROXY (VIT D DEFICIENCY, FRACTURES): Vit D, 25-Hydroxy: 31 ng/mL (ref 30–100)

## 2022-10-06 LAB — LIPID PANEL
Cholesterol: 203 mg/dL — ABNORMAL HIGH (ref <200)
HDL: 61 mg/dL (ref >=50)
LDL Cholesterol (Calc): 125 mg/dL (calc) — ABNORMAL HIGH
Non-HDL Cholesterol (Calc): 142 mg/dL (calc) — ABNORMAL HIGH (ref <130)
Total CHOL/HDL Ratio: 3.3 (calc) (ref <5.0)
Triglycerides: 75 mg/dL (ref <150)

## 2022-10-06 LAB — TSH: TSH: 2.29 mIU/L

## 2022-10-06 LAB — VITAMIN B12: Vitamin B-12: 295 pg/mL (ref 200–1100)

## 2022-10-06 LAB — FOLATE: Folate: 15.9 ng/mL

## 2022-10-18 ENCOUNTER — Encounter (HOSPITAL_COMMUNITY): Payer: Self-pay | Admitting: Emergency Medicine

## 2022-10-18 ENCOUNTER — Emergency Department (HOSPITAL_COMMUNITY)
Admission: EM | Admit: 2022-10-18 | Discharge: 2022-10-18 | Disposition: A | Discharge status: Home / Self Care | Payer: MEDICAID

## 2022-10-18 ENCOUNTER — Emergency Department (HOSPITAL_COMMUNITY): Payer: MEDICAID

## 2022-10-18 ENCOUNTER — Other Ambulatory Visit: Payer: Self-pay

## 2022-10-18 DIAGNOSIS — S7001XA Contusion of right hip, initial encounter: Secondary | ICD-10-CM | POA: Diagnosis not present

## 2022-10-18 DIAGNOSIS — M25561 Pain in right knee: Secondary | ICD-10-CM | POA: Diagnosis not present

## 2022-10-18 DIAGNOSIS — R1084 Generalized abdominal pain: Secondary | ICD-10-CM | POA: Insufficient documentation

## 2022-10-18 DIAGNOSIS — Y92410 Unspecified street and highway as the place of occurrence of the external cause: Secondary | ICD-10-CM | POA: Diagnosis not present

## 2022-10-18 DIAGNOSIS — S79911A Unspecified injury of right hip, initial encounter: Secondary | ICD-10-CM | POA: Diagnosis present

## 2022-10-18 LAB — BASIC METABOLIC PANEL
Anion gap: 9 (ref 5–15)
BUN: <5 mg/dL — ABNORMAL LOW (ref 6–20)
CO2: 20 mmol/L — ABNORMAL LOW (ref 22–32)
Calcium: 8.7 mg/dL — ABNORMAL LOW (ref 8.9–10.3)
Chloride: 108 mmol/L (ref 98–111)
Creatinine, Ser: 0.63 mg/dL (ref 0.44–1.00)
GFR, Estimated: >60 mL/min (ref >60)
Glucose, Bld: 110 mg/dL — ABNORMAL HIGH (ref 70–99)
Potassium: 3.5 mmol/L (ref 3.5–5.1)
Sodium: 137 mmol/L (ref 135–145)

## 2022-10-18 LAB — CBC WITH DIFFERENTIAL/PLATELET
Abs Immature Granulocytes: 0.01 10*3/uL (ref 0.00–0.07)
Basophils Absolute: 0 10*3/uL (ref 0.0–0.1)
Basophils Relative: 0 %
Eosinophils Absolute: 0 10*3/uL (ref 0.0–0.5)
Eosinophils Relative: 1 %
HCT: 41.6 % (ref 36.0–46.0)
Hemoglobin: 12.9 g/dL (ref 12.0–15.0)
Immature Granulocytes: 0 %
Lymphocytes Relative: 37 %
Lymphs Abs: 2.4 10*3/uL (ref 0.7–4.0)
MCH: 24.4 pg — ABNORMAL LOW (ref 26.0–34.0)
MCHC: 31 g/dL (ref 30.0–36.0)
MCV: 78.8 fL — ABNORMAL LOW (ref 80.0–100.0)
Monocytes Absolute: 0.5 10*3/uL (ref 0.1–1.0)
Monocytes Relative: 8 %
Neutro Abs: 3.5 10*3/uL (ref 1.7–7.7)
Neutrophils Relative %: 54 %
Platelets: 244 10*3/uL (ref 150–400)
RBC: 5.28 MIL/uL — ABNORMAL HIGH (ref 3.87–5.11)
RDW: 14.6 % (ref 11.5–15.5)
WBC: 6.4 10*3/uL (ref 4.0–10.5)
nRBC: 0 % (ref 0.0–0.2)

## 2022-10-18 LAB — HCG, SERUM, QUALITATIVE: Preg, Serum: NEGATIVE

## 2022-10-18 MED ORDER — ONDANSETRON 4 MG PO TBDP
4.0000 mg | ORAL_TABLET | Freq: Once | ORAL | Status: AC
  Administered 2022-10-18: 4 mg via ORAL
  Filled 2022-10-18: qty 1

## 2022-10-18 MED ORDER — LIDOCAINE 5 % EX PTCH
1.0000 | MEDICATED_PATCH | Freq: Once | CUTANEOUS | Status: DC
  Administered 2022-10-18: 1 via TRANSDERMAL
  Filled 2022-10-18: qty 1

## 2022-10-18 MED ORDER — ACETAMINOPHEN 325 MG PO TABS
650.0000 mg | ORAL_TABLET | Freq: Once | ORAL | Status: AC
  Administered 2022-10-18: 650 mg via ORAL
  Filled 2022-10-18: qty 2

## 2022-10-18 MED ORDER — KETOROLAC TROMETHAMINE 15 MG/ML IJ SOLN
15.0000 mg | Freq: Once | INTRAMUSCULAR | Status: AC
  Administered 2022-10-18: 15 mg via INTRAVENOUS
  Filled 2022-10-18: qty 1

## 2022-10-18 MED ORDER — LIDOCAINE 4 % EX PTCH: 1.0000 | MEDICATED_PATCH | CUTANEOUS | 0 refills | Status: AC
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
## 2022-10-18 NOTE — Discharge Instructions (Signed)
You do not have any evidence of injuries on CT scans or x-rays today.  Please return to the emergency department immediately for any fevers, chills, chest pain, shortness of breath, worsening abdominal pain, inability to eat or drink, have seizures, unilateral weakness or any new or worsening symptoms that are concerning to you.  As discussed you may take over-the-counter Tylenol alternating with Motrin for your pain.  You may use lidocaine patches as needed.  Please follow-up with your primary doctor.
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
## 2022-10-24 ENCOUNTER — Ambulatory Visit: Payer: Self-pay | Admitting: Student

## 2022-11-04 ENCOUNTER — Ambulatory Visit: Payer: Self-pay | Admitting: Surgery

## 2022-11-04 NOTE — Progress Notes (Signed)
Sent message, via epic in basket, requesting orders in epic from surgeon.  

## 2022-11-07 NOTE — Patient Instructions (Signed)
DUE TO COVID-19 ONLY TWO VISITORS  (aged 30 and older)  ARE ALLOWED TO COME WITH YOU AND STAY IN THE WAITING ROOM ONLY DURING PRE OP AND PROCEDURE.   **NO VISITORS ARE ALLOWED IN THE SHORT STAY AREA OR RECOVERY ROOM!!**  IF YOU WILL BE ADMITTED INTO THE HOSPITAL YOU ARE ALLOWED ONLY FOUR SUPPORT PEOPLE DURING VISITATION HOURS ONLY (7 AM -8PM)   The support person(s) must pass our screening, gel in and out, and wear a mask at all times, including in the patient's room. Patients must also wear a mask when staff or their support person are in the room. Visitors GUEST BADGE MUST BE WORN VISIBLY  One adult visitor may remain with you overnight and MUST be in the room by 8 P.M.     Your procedure is scheduled on: 11/21/22   Report to [facility] Main Entrance    Report to admitting at : 5:15 AM   Call this number if you have problems the morning of surgery [phone]   Do not eat food or drink fluids :After Midnight.  FOLLOW ANY ADDITIONAL PRE OP INSTRUCTIONS YOU RECEIVED FROM YOUR SURGEON'S OFFICE!!!   Oral Hygiene is also important to reduce your risk of infection.                                    Remember - BRUSH YOUR TEETH THE MORNING OF SURGERY WITH YOUR REGULAR TOOTHPASTE  DENTURES WILL BE REMOVED PRIOR TO SURGERY PLEASE DO NOT APPLY "Poly grip" OR ADHESIVES!!!   Do NOT smoke after Midnight   Take these medicines the morning of surgery with A SIP OF WATER: gabapentin  DO NOT TAKE ANY ORAL DIABETIC MEDICATIONS DAY OF YOUR SURGERY                   You may not have any metal on your body including hair pins, jewelry, and body piercing             Do not wear make-up, lotions, powders, perfumes/cologne, or deodorant  Do not wear nail polish including gel and S&S, artificial/acrylic nails, or any other type of covering on natural nails including finger and toenails. If you have artificial nails, gel coating, etc. that needs to be removed by a nail salon please have this  removed prior to surgery or surgery may need to be canceled/ delayed if the surgeon/ anesthesia feels like they are unable to be safely monitored.   Do not shave  48 hours prior to surgery.    Do not bring valuables to the hospital. [facility] IS NOT             RESPONSIBLE   FOR VALUABLES.   Contacts, glasses, or bridgework may not be worn into surgery.   Bring small overnight bag day of surgery.   DO NOT BRING YOUR HOME MEDICATIONS TO THE HOSPITAL. PHARMACY WILL DISPENSE MEDICATIONS LISTED ON YOUR MEDICATION LIST TO YOU DURING YOUR ADMISSION IN THE HOSPITAL!    Patients discharged on the day of surgery will not be allowed to drive home.  Someone NEEDS to stay with you for the first 24 hours after anesthesia.   Special Instructions: Bring a copy of your healthcare power of attorney and living will documents         the day of surgery if you haven't scanned them before.  Please read over the following fact sheets you were given: IF YOU HAVE QUESTIONS ABOUT YOUR PRE-OP INSTRUCTIONS PLEASE CALL [phone]    [facility] - Preparing for Surgery Before surgery, you can play an important role.  Because skin is not sterile, your skin needs to be as free of germs as possible.  You can reduce the number of germs on your skin by washing with CHG (chlorahexidine gluconate) soap before surgery.  CHG is an antiseptic cleaner which kills germs and bonds with the skin to continue killing germs even after washing. Please DO NOT use if you have an allergy to CHG or antibacterial soaps.  If your skin becomes reddened/irritated stop using the CHG and inform your nurse when you arrive at Short Stay. Do not shave (including legs and underarms) for at least 48 hours prior to the first CHG shower.  You may shave your face/neck. Please follow these instructions carefully:  1.  Shower with CHG Soap the night before surgery and the  morning of Surgery.  2.  If you choose to wash your hair, wash your  hair first as usual with your  normal  shampoo.  3.  After you shampoo, rinse your hair and body thoroughly to remove the  shampoo.                           4.  Use CHG as you would any other liquid soap.  You can apply chg directly  to the skin and wash                       Gently with a scrungie or clean washcloth.  5.  Apply the CHG Soap to your body ONLY FROM THE NECK DOWN.   Do not use on face/ open                           Wound or open sores. Avoid contact with eyes, ears mouth and genitals (private parts).                       Wash face,  Genitals (private parts) with your normal soap.             6.  Wash thoroughly, paying special attention to the area where your surgery  will be performed.  7.  Thoroughly rinse your body with warm water from the neck down.  8.  DO NOT shower/wash with your normal soap after using and rinsing off  the CHG Soap.                9.  Pat yourself dry with a clean towel.            10.  Wear clean pajamas.            11.  Place clean sheets on your bed the night of your first shower and do not  sleep with pets. Day of Surgery : Do not apply any lotions/deodorants the morning of surgery.  Please wear clean clothes to the hospital/surgery center.  FAILURE TO FOLLOW THESE INSTRUCTIONS MAY RESULT IN THE CANCELLATION OF YOUR SURGERY PATIENT SIGNATURE_________________________________  NURSE SIGNATURE__________________________________  ________________________________________________________________________

## 2022-11-08 ENCOUNTER — Encounter (HOSPITAL_COMMUNITY): Payer: Self-pay

## 2022-11-08 ENCOUNTER — Other Ambulatory Visit: Payer: Self-pay

## 2022-11-08 ENCOUNTER — Encounter (HOSPITAL_COMMUNITY)
Admission: RE | Admit: 2022-11-08 | Discharge: 2022-11-08 | Disposition: A | Discharge status: Home / Self Care | Payer: MEDICAID | Source: Ambulatory Visit | Attending: Surgery | Admitting: Surgery

## 2022-11-08 DIAGNOSIS — I10 Essential (primary) hypertension: Secondary | ICD-10-CM | POA: Diagnosis not present

## 2022-11-08 DIAGNOSIS — E119 Type 2 diabetes mellitus without complications: Secondary | ICD-10-CM

## 2022-11-08 DIAGNOSIS — Z01818 Encounter for other preprocedural examination: Secondary | ICD-10-CM | POA: Diagnosis present

## 2022-11-08 HISTORY — DX: Other specified postprocedural states: Z98.890

## 2022-11-08 HISTORY — DX: Other specified postprocedural states: R11.2

## 2022-11-08 HISTORY — DX: Other complications of anesthesia, initial encounter: T88.59XA

## 2022-11-08 HISTORY — DX: Cardiac arrhythmia, unspecified: I49.9

## 2022-11-08 NOTE — Progress Notes (Signed)
For Short Stay: COVID SWAB appointment date:  Bowel Prep reminder:   For Anesthesia: PCP - Jerre Simon, MD  Cardiologist - Maisie Fus, MD . LOV: 05/28/22  Chest x-ray - 05/18/22 EKG - 09/27/22 Stress Test -  ECHO - 07/04/22 Cardiac Cath -  Pacemaker/ICD device last checked: Pacemaker orders received: Device Rep notified:  Spinal Cord Stimulator: N/A  Sleep Study - N/A CPAP -   Fasting Blood Sugar - 70's Checks Blood Sugar ___2__ times a day Date and result of last Hgb A1c-6.2: 09/12/22  Last dose of GLP1 agonist- N/A GLP1 instructions:   Last dose of SGLT-2 inhibitors- N/A SGLT-2 instructions:   Blood Thinner Instructions: N/A Aspirin Instructions: Last Dose:  Activity level: Can go up a flight of stairs and activities of daily living without stopping and without chest pain and/or shortness of breath   Able to exercise without chest pain and/or shortness of breath  Anesthesia review: Hx: DIA,HTN,Tachycardia.  Patient denies shortness of breath, fever, cough and chest pain at PAT appointment   Patient verbalized understanding of instructions that were given to them at the PAT appointment. Patient was also instructed that they will need to review over the PAT instructions again at home before surgery.

## 2022-11-10 ENCOUNTER — Encounter (HOSPITAL_COMMUNITY)
Admission: RE | Admit: 2022-11-10 | Discharge: 2022-11-10 | Disposition: A | Discharge status: Home / Self Care | Payer: MEDICAID | Source: Ambulatory Visit | Attending: Surgery | Admitting: Surgery

## 2022-11-10 DIAGNOSIS — Z01812 Encounter for preprocedural laboratory examination: Secondary | ICD-10-CM | POA: Insufficient documentation

## 2022-11-10 DIAGNOSIS — Z01818 Encounter for other preprocedural examination: Secondary | ICD-10-CM

## 2022-11-10 DIAGNOSIS — I10 Essential (primary) hypertension: Secondary | ICD-10-CM | POA: Diagnosis not present

## 2022-11-10 DIAGNOSIS — E119 Type 2 diabetes mellitus without complications: Secondary | ICD-10-CM | POA: Diagnosis not present

## 2022-11-10 LAB — BASIC METABOLIC PANEL
Anion gap: 9 (ref 5–15)
BUN: 7 mg/dL (ref 6–20)
CO2: 22 mmol/L (ref 22–32)
Calcium: 8.9 mg/dL (ref 8.9–10.3)
Chloride: 107 mmol/L (ref 98–111)
Creatinine, Ser: 0.58 mg/dL (ref 0.44–1.00)
GFR, Estimated: >60 mL/min (ref >60)
Glucose, Bld: 100 mg/dL — ABNORMAL HIGH (ref 70–99)
Potassium: 3.4 mmol/L — ABNORMAL LOW (ref 3.5–5.1)
Sodium: 138 mmol/L (ref 135–145)

## 2022-11-10 LAB — GLUCOSE, CAPILLARY: Glucose-Capillary: 114 mg/dL — ABNORMAL HIGH (ref 70–99)

## 2022-11-10 LAB — CBC
HCT: 41.4 % (ref 36.0–46.0)
Hemoglobin: 12.7 g/dL (ref 12.0–15.0)
MCH: 24.8 pg — ABNORMAL LOW (ref 26.0–34.0)
MCHC: 30.7 g/dL (ref 30.0–36.0)
MCV: 80.9 fL (ref 80.0–100.0)
Platelets: 242 10*3/uL (ref 150–400)
RBC: 5.12 MIL/uL — ABNORMAL HIGH (ref 3.87–5.11)
RDW: 13.8 % (ref 11.5–15.5)
WBC: 7.1 10*3/uL (ref 4.0–10.5)
nRBC: 0 % (ref 0.0–0.2)

## 2022-11-14 ENCOUNTER — Encounter (HOSPITAL_COMMUNITY): Payer: Self-pay

## 2022-11-14 NOTE — Anesthesia Preprocedure Evaluation (Addendum)
Anesthesia Evaluation  Patient identified by MRN, date of birth, ID band Patient awake    Reviewed: Allergy & Precautions, H&P , NPO status , Patient's Chart, lab work & pertinent test results  History of Anesthesia Complications (+) PONV and history of anesthetic complications  Airway Mallampati: I  TM Distance: >3 FB Neck ROM: full    Dental  (+) Teeth Intact, Dental Advisory Given   Pulmonary neg pulmonary ROS   Pulmonary exam normal        Cardiovascular hypertension, Pt. on medications Normal cardiovascular exam+ dysrhythmias  Rhythm:Regular  Echo 07/2022  1. Left ventricular ejection fraction, by estimation, is 60 to 65%. The left ventricle has normal function. The left ventricle has no regional wall motion abnormalities. Left ventricular diastolic parameters were normal.   2. Right ventricular systolic function is normal. The right ventricular size is normal. There is normal pulmonary artery systolic pressure.   3. The mitral valve is normal in structure. No evidence of mitral valve regurgitation. No evidence of mitral stenosis.   4. The aortic valve is normal in structure. Aortic valve regurgitation is not visualized. No aortic stenosis is present.   5. The inferior vena cava is normal in size with greater than 50% respiratory variability, suggesting right atrial pressure of 3 mmHg.   Conclusion(s)/Recommendation(s): Normal biventricular function without evidence of hemodynamically significant valvular heart disease.      Neuro/Psych  Headaches, Seizures -,  PSYCHIATRIC DISORDERS Anxiety Depression Bipolar Disorder      GI/Hepatic Neg liver ROS,GERD  ,,  Endo/Other  diabetes, Type 2    Renal/GU negative Renal ROS     Musculoskeletal   Abdominal   Peds  Hematology  (+) Blood dyscrasia, anemia   Anesthesia Other Findings   Reproductive/Obstetrics                             Anesthesia  Physical Anesthesia Plan  ASA: 3  Anesthesia Plan: General   Post-op Pain Management: Tylenol PO (pre-op)*, Ketamine IV* and Gabapentin PO (pre-op)*   Induction:   PONV Risk Score and Plan: 4 or greater and Scopolamine patch - Pre-op, Treatment may vary due to age or medical condition, Ondansetron, Dexamethasone and Midazolam  Airway Management Planned: Oral ETT  Additional Equipment: None  Intra-op Plan:   Post-operative Plan: Extubation in OR  Informed Consent: I have reviewed the patients History and Physical, chart, labs and discussed the procedure including the risks, benefits and alternatives for the proposed anesthesia with the patient or authorized representative who has indicated his/her understanding and acceptance.     Dental advisory given  Plan Discussed with: CRNA  Anesthesia Plan Comments: (See PAT note from 8/6 by Sherlie Ban PA-C )        Anesthesia Quick Evaluation
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
## 2022-11-21 ENCOUNTER — Ambulatory Visit (HOSPITAL_COMMUNITY)
Admission: RE | Admit: 2022-11-21 | Discharge: 2022-11-21 | Disposition: A | Discharge status: Home / Self Care | Payer: MEDICAID | Attending: Surgery | Admitting: Surgery

## 2022-11-21 ENCOUNTER — Ambulatory Visit (HOSPITAL_COMMUNITY): Payer: MEDICAID | Admitting: Medical

## 2022-11-21 ENCOUNTER — Other Ambulatory Visit: Payer: Self-pay

## 2022-11-21 ENCOUNTER — Ambulatory Visit (HOSPITAL_BASED_OUTPATIENT_CLINIC_OR_DEPARTMENT_OTHER): Payer: MEDICAID | Admitting: Anesthesiology

## 2022-11-21 ENCOUNTER — Encounter (HOSPITAL_COMMUNITY)
Admission: RE | Disposition: A | Discharge status: Home / Self Care | Payer: Self-pay | Source: Home / Self Care | Attending: Surgery

## 2022-11-21 ENCOUNTER — Encounter (HOSPITAL_COMMUNITY): Payer: Self-pay | Admitting: Surgery

## 2022-11-21 DIAGNOSIS — F418 Other specified anxiety disorders: Secondary | ICD-10-CM | POA: Diagnosis not present

## 2022-11-21 DIAGNOSIS — K429 Umbilical hernia without obstruction or gangrene: Secondary | ICD-10-CM | POA: Insufficient documentation

## 2022-11-21 DIAGNOSIS — K219 Gastro-esophageal reflux disease without esophagitis: Secondary | ICD-10-CM | POA: Insufficient documentation

## 2022-11-21 DIAGNOSIS — F419 Anxiety disorder, unspecified: Secondary | ICD-10-CM | POA: Diagnosis not present

## 2022-11-21 DIAGNOSIS — E119 Type 2 diabetes mellitus without complications: Secondary | ICD-10-CM | POA: Diagnosis not present

## 2022-11-21 DIAGNOSIS — F319 Bipolar disorder, unspecified: Secondary | ICD-10-CM | POA: Insufficient documentation

## 2022-11-21 DIAGNOSIS — Z7984 Long term (current) use of oral hypoglycemic drugs: Secondary | ICD-10-CM | POA: Diagnosis not present

## 2022-11-21 DIAGNOSIS — Z01818 Encounter for other preprocedural examination: Secondary | ICD-10-CM

## 2022-11-21 DIAGNOSIS — Z8249 Family history of ischemic heart disease and other diseases of the circulatory system: Secondary | ICD-10-CM | POA: Diagnosis not present

## 2022-11-21 DIAGNOSIS — Z79899 Other long term (current) drug therapy: Secondary | ICD-10-CM | POA: Diagnosis not present

## 2022-11-21 DIAGNOSIS — I10 Essential (primary) hypertension: Secondary | ICD-10-CM | POA: Insufficient documentation

## 2022-11-21 HISTORY — PX: UMBILICAL HERNIA REPAIR: SHX196

## 2022-11-21 LAB — POCT PREGNANCY, URINE: Preg Test, Ur: NEGATIVE

## 2022-11-21 LAB — GLUCOSE, CAPILLARY
Glucose-Capillary: 125 mg/dL — ABNORMAL HIGH (ref 70–99)
Glucose-Capillary: 134 mg/dL — ABNORMAL HIGH (ref 70–99)

## 2022-11-21 SURGERY — REPAIR, HERNIA, UMBILICAL, ADULT
Anesthesia: General | Site: Abdomen

## 2022-11-21 MED ORDER — MIDAZOLAM HCL 2 MG/2ML IJ SOLN
INTRAMUSCULAR | Status: AC
  Filled 2022-11-21: qty 2

## 2022-11-21 MED ORDER — BUPIVACAINE-EPINEPHRINE 0.25% -1:200000 IJ SOLN
INTRAMUSCULAR | Status: DC | PRN
  Administered 2022-11-21: 10 mL

## 2022-11-21 MED ORDER — DEXAMETHASONE SODIUM PHOSPHATE 10 MG/ML IJ SOLN
INTRAMUSCULAR | Status: AC
  Filled 2022-11-21: qty 1

## 2022-11-21 MED ORDER — KETOROLAC TROMETHAMINE 15 MG/ML IJ SOLN
15.0000 mg | INTRAMUSCULAR | Status: AC
  Administered 2022-11-21: 15 mg via INTRAVENOUS
  Filled 2022-11-21: qty 1

## 2022-11-21 MED ORDER — LACTATED RINGERS IV SOLN: INTRAVENOUS | Status: DC

## 2022-11-21 MED ORDER — PROPOFOL 10 MG/ML IV BOLUS
INTRAVENOUS | Status: AC
  Filled 2022-11-21: qty 20

## 2022-11-21 MED ORDER — ROCURONIUM BROMIDE 10 MG/ML (PF) SYRINGE
PREFILLED_SYRINGE | INTRAVENOUS | Status: DC | PRN
  Administered 2022-11-21: 50 mg via INTRAVENOUS

## 2022-11-21 MED ORDER — LIDOCAINE HCL (PF) 2 % IJ SOLN
INTRAMUSCULAR | Status: AC
  Filled 2022-11-21: qty 5

## 2022-11-21 MED ORDER — ACETAMINOPHEN 500 MG PO TABS
1000.0000 mg | ORAL_TABLET | Freq: Once | ORAL | Status: DC
  Filled 2022-11-21: qty 2

## 2022-11-21 MED ORDER — ONDANSETRON HCL 4 MG/2ML IJ SOLN
INTRAMUSCULAR | Status: DC | PRN
Start: 2022-11-21 — End: 2022-11-21
  Administered 2022-11-21: 4 mg via INTRAVENOUS

## 2022-11-21 MED ORDER — BUPIVACAINE LIPOSOME 1.3 % IJ SUSP: 20.0000 mL | Freq: Once | INTRAMUSCULAR | Status: DC

## 2022-11-21 MED ORDER — INSULIN ASPART 100 UNIT/ML IJ SOLN: 0.0000 [IU] | INTRAMUSCULAR | Status: DC | PRN

## 2022-11-21 MED ORDER — ROCURONIUM BROMIDE 10 MG/ML (PF) SYRINGE
PREFILLED_SYRINGE | INTRAVENOUS | Status: AC
  Filled 2022-11-21: qty 10

## 2022-11-21 MED ORDER — SUGAMMADEX SODIUM 200 MG/2ML IV SOLN
INTRAVENOUS | Status: DC | PRN
  Administered 2022-11-21: 200 mg via INTRAVENOUS

## 2022-11-21 MED ORDER — CEFAZOLIN SODIUM-DEXTROSE 2-4 GM/100ML-% IV SOLN
2.0000 g | INTRAVENOUS | Status: AC
  Administered 2022-11-21: 2 g via INTRAVENOUS
  Filled 2022-11-21: qty 100

## 2022-11-21 MED ORDER — ONDANSETRON HCL 4 MG/2ML IJ SOLN
INTRAMUSCULAR | Status: AC
  Filled 2022-11-21: qty 2

## 2022-11-21 MED ORDER — LIDOCAINE 2% (20 MG/ML) 5 ML SYRINGE
INTRAMUSCULAR | Status: DC | PRN
  Administered 2022-11-21: 80 mg via INTRAVENOUS

## 2022-11-21 MED ORDER — HYDROMORPHONE HCL 1 MG/ML IJ SOLN
INTRAMUSCULAR | Status: AC
  Filled 2022-11-21: qty 1

## 2022-11-21 MED ORDER — CHLORHEXIDINE GLUCONATE CLOTH 2 % EX PADS: 6.0000 | MEDICATED_PAD | Freq: Once | CUTANEOUS | Status: DC

## 2022-11-21 MED ORDER — OXYCODONE-ACETAMINOPHEN 5-325 MG PO TABS: 1.0000 | ORAL_TABLET | ORAL | 0 refills | Status: DC | PRN

## 2022-11-21 MED ORDER — HYDROMORPHONE HCL 1 MG/ML IJ SOLN
0.2500 mg | INTRAMUSCULAR | Status: DC | PRN
  Administered 2022-11-21: 0.25 mg via INTRAVENOUS
  Administered 2022-11-21: 0.5 mg via INTRAVENOUS
  Administered 2022-11-21: 0.25 mg via INTRAVENOUS
  Administered 2022-11-21: 0.5 mg via INTRAVENOUS

## 2022-11-21 MED ORDER — ACETAMINOPHEN 500 MG PO TABS
1000.0000 mg | ORAL_TABLET | ORAL | Status: AC
  Administered 2022-11-21: 1000 mg via ORAL

## 2022-11-21 MED ORDER — 0.9 % SODIUM CHLORIDE (POUR BTL) OPTIME
TOPICAL | Status: DC | PRN
  Administered 2022-11-21: 1000 mL

## 2022-11-21 MED ORDER — GABAPENTIN 300 MG PO CAPS
300.0000 mg | ORAL_CAPSULE | Freq: Once | ORAL | Status: DC
  Filled 2022-11-21: qty 1

## 2022-11-21 MED ORDER — BUPIVACAINE-EPINEPHRINE 0.25% -1:200000 IJ SOLN
INTRAMUSCULAR | Status: AC
  Filled 2022-11-21: qty 1

## 2022-11-21 MED ORDER — ENOXAPARIN SODIUM 40 MG/0.4ML IJ SOSY
40.0000 mg | PREFILLED_SYRINGE | Freq: Once | INTRAMUSCULAR | Status: AC
  Administered 2022-11-21: 40 mg via SUBCUTANEOUS
  Filled 2022-11-21: qty 0.4

## 2022-11-21 MED ORDER — DROPERIDOL 2.5 MG/ML IJ SOLN: 0.6250 mg | Freq: Once | INTRAMUSCULAR | Status: DC | PRN

## 2022-11-21 MED ORDER — CHLORHEXIDINE GLUCONATE 0.12 % MT SOLN
15.0000 mL | Freq: Once | OROMUCOSAL | Status: AC
  Administered 2022-11-21: 15 mL via OROMUCOSAL

## 2022-11-21 MED ORDER — GABAPENTIN 300 MG PO CAPS
300.0000 mg | ORAL_CAPSULE | ORAL | Status: AC
  Administered 2022-11-21: 300 mg via ORAL

## 2022-11-21 MED ORDER — MIDAZOLAM HCL 5 MG/5ML IJ SOLN
INTRAMUSCULAR | Status: DC | PRN
  Administered 2022-11-21: 2 mg via INTRAVENOUS

## 2022-11-21 MED ORDER — ORAL CARE MOUTH RINSE: 15.0000 mL | Freq: Once | OROMUCOSAL | Status: AC

## 2022-11-21 MED ORDER — PROPOFOL 10 MG/ML IV BOLUS
INTRAVENOUS | Status: DC | PRN
Start: 2022-11-21 — End: 2022-11-21
  Administered 2022-11-21: 180 mg via INTRAVENOUS

## 2022-11-21 MED ORDER — DEXAMETHASONE SODIUM PHOSPHATE 10 MG/ML IJ SOLN
INTRAMUSCULAR | Status: DC | PRN
  Administered 2022-11-21: 5 mg via INTRAVENOUS

## 2022-11-21 MED ORDER — FENTANYL CITRATE (PF) 100 MCG/2ML IJ SOLN
INTRAMUSCULAR | Status: DC | PRN
  Administered 2022-11-21 (×2): 50 ug via INTRAVENOUS

## 2022-11-21 MED ORDER — FENTANYL CITRATE (PF) 100 MCG/2ML IJ SOLN
INTRAMUSCULAR | Status: AC
  Filled 2022-11-21: qty 2

## 2022-11-21 SURGICAL SUPPLY — 25 items
ADH SKN CLS APL DERMABOND .7 (GAUZE/BANDAGES/DRESSINGS) ×1
BALL CTTN LRG ABS STRL LF (GAUZE/BANDAGES/DRESSINGS) ×1
COTTONBALL LRG STERILE PKG (GAUZE/BANDAGES/DRESSINGS) ×1 IMPLANT
COVER SURGICAL LIGHT HANDLE (MISCELLANEOUS) ×1 IMPLANT
DERMABOND ADVANCED .7 DNX12 (GAUZE/BANDAGES/DRESSINGS) ×1 IMPLANT
DRAPE LAPAROTOMY T 98X78 PEDS (DRAPES) ×1 IMPLANT
DRSG TEGADERM 4X4.75 (GAUZE/BANDAGES/DRESSINGS) ×1 IMPLANT
ELECT REM PT RETURN 15FT ADLT (MISCELLANEOUS) ×1 IMPLANT
GLOVE INDICATOR 8.0 STRL GRN (GLOVE) ×1 IMPLANT
GOWN STRL REUS W/ TWL XL LVL3 (GOWN DISPOSABLE) ×1 IMPLANT
GOWN STRL REUS W/TWL XL LVL3 (GOWN DISPOSABLE) ×1
KIT BASIN OR (CUSTOM PROCEDURE TRAY) ×1 IMPLANT
KIT TURNOVER KIT A (KITS) IMPLANT
NDL HYPO 22X1.5 SAFETY MO (MISCELLANEOUS) ×1 IMPLANT
NEEDLE HYPO 22X1.5 SAFETY MO (MISCELLANEOUS) ×1
NS IRRIG 1000ML POUR BTL (IV SOLUTION) ×1 IMPLANT
PACK GENERAL/GYN (CUSTOM PROCEDURE TRAY) ×1 IMPLANT
SPIKE FLUID TRANSFER (MISCELLANEOUS) ×1 IMPLANT
SUT MNCRL AB 4-0 PS2 18 (SUTURE) ×1 IMPLANT
SUT NOVA NAB GS-21 0 18 T12 DT (SUTURE) IMPLANT
SUT PDS AB 2-0 CT2 27 (SUTURE) IMPLANT
SUT VIC AB 3-0 SH 8-18 (SUTURE) ×1 IMPLANT
SYR CONTROL 10ML LL (SYRINGE) ×1 IMPLANT
TOWEL OR 17X26 10 PK STRL BLUE (TOWEL DISPOSABLE) ×1 IMPLANT
TRAY FOLEY MTR SLVR 16FR STAT (SET/KITS/TRAYS/PACK) IMPLANT

## 2022-11-21 NOTE — H&P (Signed)
Admitting Physician: Hyman Hopes Devera Englander  Service: General surgery  CC: hernia  Subjective   HPI: Stephanie Mack is an 32 y.o. female who is here for hernia repair  Past Medical History:  Diagnosis Date   ADHD (attention deficit hyperactivity disorder)    Anemia    Anxiety    Bipolar 1 disorder (HCC)    Chlamydia 05/31/2010   Chronic constipation    Complication of anesthesia    Depression    depression   Dysrhythmia    Fibroid    GERD (gastroesophageal reflux disease)    with pregnancy   Gestational diabetes    current pregnancy   Gonorrhea contact, treated    Headache    Hypertension    Infection    UTI   PONV (postoperative nausea and vomiting)    Pregnancy induced hypertension    previous pregnancy   Pseudocyesis 2013   Seen in MAU for percieved FM, abd distension. Normal exam.    Sickle cell trait (HCC)    Type 2 diabetes mellitus (HCC) 02/13/2017    Past Surgical History:  Procedure Laterality Date   CESAREAN SECTION N/A 09/01/2012   Procedure:  Primary cesarean section with delivery of baby girl at 73.  Apgars 1/1.  ;  Surgeon: Kathreen Cosier, MD;  [facility];  Service: Obstetrics;  Laterality: N/A;   CESAREAN SECTION N/A 01/07/2015   Procedure: REPEAT CESAREAN SECTION;  Surgeon: Brock Bad, MD;  Location[facility];  Service: Obstetrics;  Laterality: N/A;   CESAREAN SECTION WITH BILATERAL TUBAL LIGATION N/A 09/21/2021   Procedure: CESAREAN SECTION WITH BILATERAL TUBAL LIGATION;  Surgeon: Essie Hart, MD;  Location: [facility];  Service: Obstetrics;  Laterality: N/A;   DILATION AND CURETTAGE OF UTERUS     NASAL SEPTUM SURGERY     WISDOM TOOTH EXTRACTION      Family History  Problem Relation Age of Onset   Kidney disease Mother    Lupus Mother    Vision loss Father    Hypertension Father    Cancer Sister    Asthma Brother    Diabetes Maternal Grandfather    Heart disease Neg Hx     Social:  reports that she has never smoked.  She has never been exposed to tobacco smoke. She has never used smokeless tobacco. She reports that she does not drink alcohol and does not use drugs.  Allergies: No Known Allergies  Medications: Current Outpatient Medications  Medication Instructions   amLODipine (NORVASC) 5 mg, Oral, Daily at bedtime   gabapentin (NEURONTIN) 100 mg, Oral, 2 times daily   ibuprofen (ADVIL) 600 mg, Oral, Every 6 hours PRN   ibuprofen (ADVIL) 800 mg, Oral, 2 times daily PRN   medroxyPROGESTERone (PROVERA) 20 mg, Oral, 3 times daily   megestrol (MEGACE) 20 mg, Oral, Daily   metFORMIN (GLUMETZA) 1000 MG (MOD) 24 hr tablet Take 1 tablet, by mouth, after breakfast and after supper.   naproxen (NAPROSYN) 375 mg, Oral, 2 times daily   ondansetron (ZOFRAN-ODT) 4 mg, Oral, Every 8 hours PRN   ondansetron (ZOFRAN-ODT) 4 mg, Oral, Every 8 hours PRN   pantoprazole (PROTONIX) 20 mg, Oral, Daily   senna-docusate (SENOKOT-S) 8.6-50 MG tablet 2 tablets, Oral, 2 times daily PRN   sertraline (ZOLOFT) 50 mg, Daily    ROS - all of the below systems have been reviewed with the patient and positives are indicated with bold text General: chills, fever or night sweats Eyes: blurry vision  or double vision ENT: epistaxis or sore throat Allergy/Immunology: itchy/watery eyes or nasal congestion Hematologic/Lymphatic: bleeding problems, blood clots or swollen lymph nodes Endocrine: temperature intolerance or unexpected weight changes Breast: new or changing breast lumps or nipple discharge Resp: cough, shortness of breath, or wheezing CV: chest pain or dyspnea on exertion GI: as per HPI GU: dysuria, trouble voiding, or hematuria MSK: joint pain or joint stiffness Neuro: TIA or stroke symptoms Derm: pruritus and skin lesion changes Psych: anxiety and depression  Objective   PE Blood pressure (!) 143/97, pulse 65, temperature 98.3 F (36.8 C), resp. rate 18, height 5\' 2"  (1.575 m), weight 68.3 kg, last menstrual period  09/11/2022, SpO2 99%, not currently breastfeeding. Constitutional: NAD; conversant; no deformities Eyes: Moist conjunctiva; no lid lag; anicteric; PERRL Neck: Trachea midline; no thyromegaly Lungs: Normal respiratory effort; no tactile fremitus CV: RRR; no palpable thrills; no pitting edema GI: Abd umbilical hernia; no palpable hepatosplenomegaly MSK: Normal range of motion of extremities; no clubbing/cyanosis Psychiatric: Appropriate affect; alert and oriented x3 Lymphatic: No palpable cervical or axillary lymphadenopathy  Results for orders placed or performed during the hospital encounter of 11/21/22 (from the past 24 hour(s))  Pregnancy, urine POC     Status: None   Collection Time: 11/21/22  6:18 AM  Result Value Ref Range   Preg Test, Ur NEGATIVE NEGATIVE  Glucose, capillary     Status: Abnormal   Collection Time: 11/21/22  6:25 AM  Result Value Ref Range   Glucose-Capillary 125 (H) 70 - 99 mg/dL   Comment 1 Notify RN    Comment 2 Document in Chart     Imaging Orders  No imaging studies ordered today   Labs, Imaging and Diagnostic Testing: CT Abd/Pel w/ contrast 09/26/22  1. Small fat containing umbilical hernia. Stranding within the herniated fat may suggest superimposed incarceration. 2. No evidence of pulmonary embolus.  1.1 CM x 1.2 CM umbilical hernia defect  Assessment and Plan    Diagnoses and all orders for this visit:  Umbilical hernia without obstruction and without gangrene   Ms. Duffer has a 1.1 cm x 1.2 cm umbilical hernia. I recommended open umbilical hernia repair with mesh. We discussed the procedure, its risks, benefits and alternatives. After a full discussion and all questions answered, the patient granted consent to proceed. My surgery scheduler will reach out to the patient to schedule surgery.      ICD-10-CM   1. Preop testing  Z01.818 CBG monitoring       Quentin Ore, MD  [facility] Use [email or web address] to  contact on call provider

## 2022-11-21 NOTE — Discharge Instructions (Signed)
 VENTRAL HERNIA REPAIR POST OPERATIVE INSTRUCTIONS  Thinking Clearly  The anesthesia may cause you to feel different for 1 or 2 days. Do not drive, drink alcohol, or make any big decisions for at least 2 days.  Nutrition When you wake up, you will be able to drink small amounts of liquid. If you do not feel sick, you can slowly advance your diet to regular foods. Continue to drink lots of fluids, usually about 8 to 10 glasses per day. Eat a high-fiber diet so you don't strain during bowel movements. High-Fiber Foods Foods high in fiber include beans, bran cereals and whole-grain breads, peas, dried fruit (figs, apricots, and dates), raspberries, blackberries, strawberries, sweet corn, broccoli, baked potatoes with skin, plums, pears, apples, greens, and nuts. Activity Slowly increase your activity. Be sure to get up and walk every hour or so to prevent blood clots. No heavy lifting or strenuous activity for 4 weeks following surgery to prevent hernias at your incision sites or recurrence of your hernia. It is normal to feel tired. You may need more sleep than usual.  Get your rest but make sure to get up and move around frequently to prevent blood clots and pneumonia.  Work and Return to School You can go back to work when you feel well enough. Discuss the timing with your surgeon. You can usually go back to school or work 1 week or less after an laparoscopic or an open repair. If your work requires heavy lifting or strenuous activity you need to be placed on light duty for 4 weeks following surgery. You can return to gym class, sports or other physical activities 4 weeks after surgery.  Wound Care You may experience significant bruising throughout the abdominal wall that may track down into the groin including into the scrotum in males.  Rest, elevating the groin and scrotum above the level of the heart, ice and compression with tight fitting underwear or an abdominal binder can help.   Always wash your hands before and after touching near your incision site. Do not soak in a bathtub until cleared at your follow up appointment. You may take a shower 24 hours after surgery. A small amount of drainage from the incision is normal. If the drainage is thick and yellow or the site is red, you may have an infection, so call your surgeon. If you have a drain in one of your incisions, it will be taken out in office when the drainage stops. Steri-Strips will fall off in 7 to 10 days or they will be removed during your first office visit. If you have dermabond glue covering over the incision, allow the glue to flake off on its own. Protect the new skin, especially from the sun. The sun can burn and cause darker scarring. Your scar will heal in about 4 to 6 weeks and will become softer and continue to fade over the next year.  The cosmetic appearance of the incisions will improve over the course of the first year after surgery. Sensation around your incision will return in a few weeks or months.  Bowel Movements After intestinal surgery, you may have loose watery stools for several days. If watery diarrhea lasts longer than 3 days, contact your surgeon. Pain medication (narcotics) can cause constipation. Increase the fiber in your diet with high-fiber foods if you are constipated. You can take an over the counter stool softener like Colace to avoid constipation.  Additional over the counter medications can also be used   if Colace isn't sufficient (for example, Milk of Magnesia or Miralax).  Pain The amount of pain is different for each person. Some people need only 1 to 3 doses of pain control medication, while others need more. Take alternating doses of tylenol and ibuprofen around the clock for the first five days following surgery.  This will provide a baseline of pain control and help with inflammation.  Take the narcotic pain medication in addition if needed for severe pain.  Contact  Your Surgeon at [phone], if you have: Pain that will not go away Pain that gets worse A fever of more than 101F (38.3C) Repeated vomiting Swelling, redness, bleeding, or bad-smelling drainage from your wound site Strong abdominal pain No bowel movement or unable to pass gas for 3 days Watery diarrhea lasting longer than 3 days  Pain Control The goal of pain control is to minimize pain, keep you moving and help you heal. Your surgical team will work with you on your pain plan. Most often a combination of therapies and medications are used to control your pain. You may also be given medication (local anesthetic) at the surgical site. This may help control your pain for several days. Extreme pain puts extra stress on your body at a time when your body needs to focus on healing. Do not wait until your pain has reached a level "10" or is unbearable before telling your doctor or nurse. It is much easier to control pain before it becomes severe. Following a laparoscopic procedure, pain is sometimes felt in the shoulder. This is due to the gas inserted into your abdomen during the procedure. Moving and walking helps to decrease the gas and the right shoulder pain.  Use the guide below for ways to manage your post-operative pain. Learn more by going to facs.org/safepaincontrol.  How Intense Is My Pain Common Therapies to Feel Better       I hardly notice my pain, and it does not interfere with my activities.  I notice my pain and it distracts me, but I can still do activities (sitting up, walking, standing).  Non-Medication Therapies  Ice (in a bag, applied over clothing at the surgical site), elevation, rest, meditation, massage, distraction (music, TV, play) walking and mild exercise Splinting the abdomen with pillows +  Non-Opioid Medications Acetaminophen (Tylenol) Non-steroidal anti-inflammatory drugs (NSAIDS) Aspirin, Ibuprofen (Motrin, Advil) Naproxen (Aleve) Take these as  needed, when you feel pain. Both acetaminophen and NSAIDs help to decrease pain and swelling (inflammation).      My pain is hard to ignore and is more noticeable even when I rest.  My pain interferes with my usual activities.  Non-Medication Therapies  +  Non-Opioid medications  Take on a regular schedule (around-the-clock) instead of as needed. (For example, Tylenol every 6 hours at 9:00 am, 3:00 pm, 9:00 pm, 3:00 am and Motrin every 6 hours at 12:00 am, 6:00 am, 12:00 pm, 6:00 pm)         I am focused on my pain, and I am not doing my daily activities.  I am groaning in pain, and I cannot sleep. I am unable to do anything.  My pain is as bad as it could be, and nothing else matters.  Non-Medication Therapies  +  Around-the-Clock Non-Opioid Medications  +  Short-acting opioids  Opioids should be used with other medications to manage severe pain. Opioids block pain and give a feeling of euphoria (feel high). Addiction, a serious side effect of opioids, is   rare with short-term (a few days) use.  Examples of short-acting opioids include: Tramadol (Ultram), Hydrocodone (Norco, Vicodin), Hydromorphone (Dilaudid), Oxycodone (Oxycontin)     The above directions have been adapted from the [employer] Surgical Patient Education Program.  Please refer to the ACS website if needed: https://www.facs.org/-/media/files/education/patient-ed/adultumbilical.ashx   Paul Stechschulte, MD [facility] [address], [address], [place], [place]  [address] ?  [address] [address], [place], [place]   [address] [phone] ? [phone] ? FAX [phone] Web site: [email or web address]  

## 2022-11-21 NOTE — Transfer of Care (Signed)
Immediate Anesthesia Transfer of Care Note  Patient: Stephanie Mack  Procedure(s) Performed: HERNIA REPAIR UMBILICAL ADULT (Abdomen)  Patient Location: PACU  Anesthesia Type:General  Level of Consciousness: awake, alert , oriented, and patient cooperative  Airway & Oxygen Therapy: Patient Spontanous Breathing and Patient connected to face mask oxygen  Post-op Assessment: Report given to RN, Post -op Vital signs reviewed and stable, and Patient moving all extremities  Post vital signs: Reviewed and stable  Last Vitals:  Vitals Value Taken Time  BP 142/89 11/21/22 0823  Temp    Pulse 90 11/21/22 0824  Resp 20 11/21/22 0824  SpO2 100 % 11/21/22 0824  Vitals shown include unfiled device data.  Last Pain: There were no vitals filed for this visit.       Complications: No notable events documented.

## 2022-11-21 NOTE — Op Note (Signed)
   Patient: Stephanie Mack (03-05-1991, [id number])  Date of Surgery: 11/21/2022   Preoperative Diagnosis: UMBILICAL HERNIA   Postoperative Diagnosis: UMBILICAL HERNIA   Surgical Procedure: HERNIA REPAIR UMBILICAL ADULT:    Operative Team Members:  Surgeons and Role:    * Sunita Demond, Hyman Hopes, MD - Primary   Anesthesiologist: [name], MD CRNA: Elisabeth Cara, CRNA; Epimenio Sarin, CRNA   Anesthesia: General   Fluids:  Total I/O In: 100 [IV Piggyback:100] Out: -   Complications: none  Drains:  none   Specimen: None  Disposition:  PACU - hemodynamically stable.  Plan of Care: Discharge to home after PACU    Indications for Procedure: Stephanie Mack is a 32 y.o. female who presented with an umbilical hernia.  I recommended open umbilical hernia repair with mesh.  We discussed the procedure, its risks, benefits and alterantives. After a full discussion and all questions answered the patient granted consent to proceed.  We will proceed as scheduled.  Findings:  Hernia Location: umbilical Hernia Size:  1 x 1 cm  Mesh Size &Type:  none Mesh Position: n/a  Description of Procedure:  The patient was positioned supine, padded and secured to the bed.  The abdomen was widely prepped and draped.  A time out procedure was performed.   A sutured umbilical hernia repair was performed utilizing figure of eight 0 novafil suture. The defect was closed horizontally under no tension.  The wound was irrigated with saline.  The umbilical skin was tacked down to the fascial repair with 3-0 vicryl suture.  The skin was closed with 4-0 Monocryl subcuticular suture and skin glue.    Ivar Drape, MD General, Bariatric, & Minimally Invasive Surgery [facility], [place]

## 2022-11-21 NOTE — Anesthesia Procedure Notes (Signed)
Procedure Name: Intubation Date/Time: 11/21/2022 7:37 AM  Performed by: Elisabeth Cara, CRNAPre-anesthesia Checklist: Patient identified, Emergency Drugs available, Suction available, Patient being monitored and Timeout performed Patient Re-evaluated:Patient Re-evaluated prior to induction Oxygen Delivery Method: Circle system utilized Preoxygenation: Pre-oxygenation with 100% oxygen Induction Type: IV induction Ventilation: Mask ventilation without difficulty Laryngoscope Size: Mac and 4 Grade View: Grade I Tube type: Oral Tube size: 7.5 mm Number of attempts: 1 Airway Equipment and Method: Stylet Placement Confirmation: ETT inserted through vocal cords under direct vision, positive ETCO2 and breath sounds checked- equal and bilateral Secured at: 21 cm Tube secured with: Tape Dental Injury: Teeth and Oropharynx as per pre-operative assessment

## 2022-11-21 NOTE — Anesthesia Postprocedure Evaluation (Signed)
Anesthesia Post Note  Patient: Stephanie Mack  Procedure(s) Performed: HERNIA REPAIR UMBILICAL ADULT (Abdomen)     Patient location during evaluation: PACU Anesthesia Type: General Level of consciousness: sedated and patient cooperative Pain management: pain level controlled Vital Signs Assessment: post-procedure vital signs reviewed and stable Respiratory status: spontaneous breathing Cardiovascular status: stable Anesthetic complications: no   No notable events documented.  Last Vitals:  Vitals:   11/21/22 0930 11/21/22 0945  BP: (!) 143/94 137/89  Pulse: 69 70  Resp: 13   Temp: (!) 36.3 C 36.6 C  SpO2: 95% 95%    Last Pain:  Vitals:   11/21/22 0945  PainSc: 5                  [name]

## 2022-11-22 ENCOUNTER — Encounter (HOSPITAL_COMMUNITY): Payer: Self-pay | Admitting: Surgery

## 2022-12-17 ENCOUNTER — Ambulatory Visit (HOSPITAL_COMMUNITY)
Admission: EM | Admit: 2022-12-17 | Discharge: 2022-12-17 | Disposition: A | Discharge status: Home / Self Care | Payer: MEDICAID | Attending: Emergency Medicine | Admitting: Emergency Medicine

## 2022-12-17 ENCOUNTER — Encounter (HOSPITAL_COMMUNITY): Payer: Self-pay | Admitting: Emergency Medicine

## 2022-12-17 DIAGNOSIS — J029 Acute pharyngitis, unspecified: Secondary | ICD-10-CM

## 2022-12-17 DIAGNOSIS — J020 Streptococcal pharyngitis: Secondary | ICD-10-CM

## 2022-12-17 LAB — POCT RAPID STREP A (OFFICE): Rapid Strep A Screen: POSITIVE — AB

## 2022-12-17 MED ORDER — LIDOCAINE VISCOUS HCL 2 % MT SOLN: 15.0000 mL | OROMUCOSAL | 0 refills | Status: DC | PRN

## 2022-12-17 MED ORDER — PENICILLIN V POTASSIUM 500 MG PO TABS: 500.0000 mg | ORAL_TABLET | Freq: Two times a day (BID) | ORAL | 0 refills | Status: AC

## 2022-12-17 NOTE — ED Provider Notes (Signed)
[facility]    CSN: [id number] Arrival date & time: 12/17/22  1426      History   Chief Complaint Chief Complaint  Patient presents with   Sore Throat    HPI Stephanie Mack is a 32 y.o. female.   Patient presents with sore throat, congestion, and fatigue that began yesterday.  Reports taking NyQuil, TheraFlu, and Tylenol with minimal relief of symptoms.  Patient endorses trouble swallowing due to sore throat.  Denies cough, shortness of breath, fever, abdominal pain, nausea, vomiting, and diarrhea.   Sore Throat Pertinent negatives include no chest pain, no abdominal pain, no headaches and no shortness of breath.    Past Medical History:  Diagnosis Date   ADHD (attention deficit hyperactivity disorder)    Anemia    Anxiety    Bipolar 1 disorder (HCC)    Chlamydia 05/31/2010   Chronic constipation    Complication of anesthesia    Depression    depression   Dysrhythmia    Fibroid    GERD (gastroesophageal reflux disease)    with pregnancy   Gestational diabetes    current pregnancy   Gonorrhea contact, treated    Headache    Hypertension    Infection    UTI   PONV (postoperative nausea and vomiting)    Pregnancy induced hypertension    previous pregnancy   Pseudocyesis 2013   Seen in MAU for percieved FM, abd distension. Normal exam.    Sickle cell trait (HCC)    Type 2 diabetes mellitus (HCC) 02/13/2017    Patient Active Problem List   Diagnosis Date Noted   Umbilical hernia without obstruction and without gangrene 10/02/2022   Mood changes 09/12/2022   Syncope 06/28/2022   Status post repeat low transverse cesarean section 09/21/2021   Scalp lesion 07/25/2019   Herpes infection 06/23/2017   Type 2 diabetes mellitus (HCC) 02/13/2017   Bipolar affective disorder, depressed, severe (HCC)    ADHD (attention deficit hyperactivity disorder) 06/19/2015   Bipolar 1 disorder (HCC) 06/19/2015   History of classical cesarean section 01/07/2015    Hx of preeclampsia, prior pregnancy, currently pregnant    Essential hypertension, benign 11/27/2013   Adjustment disorder with depressed mood 08/13/2013   Abdominal pain 12/04/2012   Pseudocyesis     Past Surgical History:  Procedure Laterality Date   CESAREAN SECTION N/A 09/01/2012   Procedure:  Primary cesarean section with delivery of baby girl at 65.  Apgars 1/1.  ;  Surgeon: Kathreen Cosier, MD;  Location: [facility];  Service: Obstetrics;  Laterality: N/A;   CESAREAN SECTION N/A 01/07/2015   Procedure: REPEAT CESAREAN SECTION;  Surgeon: Brock Bad, MD;  Location: [facility];  Service: Obstetrics;  Laterality: N/A;   CESAREAN SECTION WITH BILATERAL TUBAL LIGATION N/A 09/21/2021   Procedure: CESAREAN SECTION WITH BILATERAL TUBAL LIGATION;  Surgeon: Essie Hart, MD;  Location: [facility];  Service: Obstetrics;  Laterality: N/A;   DILATION AND CURETTAGE OF UTERUS     NASAL SEPTUM SURGERY     UMBILICAL HERNIA REPAIR N/A 11/21/2022   Procedure: HERNIA REPAIR UMBILICAL ADULT;  Surgeon: Dossie Der, Hyman Hopes, MD;  Location: [facility];  Service: General;  Laterality: N/A;   WISDOM TOOTH EXTRACTION      OB History     Gravida  4   Para  3   Term  0   Preterm  3   AB  1   Living  2  SAB  1   IAB  0   Ectopic  0   Multiple  0   Live Births  3            Home Medications    Prior to Admission medications   Medication Sig Start Date End Date Taking? Authorizing Provider  lidocaine (XYLOCAINE) 2 % solution Use as directed 15 mLs in the mouth or throat as needed (sore throat). 12/17/22  Yes Susann Givens, Jaishon Krisher A, NP  penicillin v potassium (VEETID) 500 MG tablet Take 1 tablet (500 mg total) by mouth 2 (two) times daily for 10 days. 12/17/22 12/27/22 Yes Mauricio Dahlen A, NP  amLODipine (NORVASC) 5 MG tablet Take 1 tablet (5 mg total) by mouth at bedtime. Patient not taking: Reported on 09/30/2022 01/11/22   Jerre Simon, MD  gabapentin (NEURONTIN) 100 MG capsule Take  100 mg by mouth 2 (two) times daily. 10/05/22   [provider]  ibuprofen (ADVIL) 600 MG tablet Take 1 tablet (600 mg total) by mouth every 6 (six) hours as needed for moderate pain. Patient not taking: Reported on 11/04/2022 01/11/22   Jerre Simon, MD  ibuprofen (ADVIL) 800 MG tablet Take 800 mg by mouth 2 (two) times daily as needed. 10/05/22   [provider]  medroxyPROGESTERone (PROVERA) 10 MG tablet Take 2 tablets (20 mg total) by mouth 3 (three) times daily for 10 days. Patient not taking: Reported on 04/10/2022 01/15/22 01/25/22  Jerre Simon, MD  megestrol (MEGACE) 20 MG tablet Take 1 tablet (20 mg total) by mouth daily. Patient not taking: Reported on 09/30/2022 04/10/22   Carlynn Herald, CNM  metFORMIN (GLUMETZA) 1000 MG (MOD) 24 hr tablet Take 1 tablet, by mouth, after breakfast and after supper. Patient not taking: Reported on 09/07/2022 01/11/22   Jerre Simon, MD  naproxen (NAPROSYN) 375 MG tablet Take 1 tablet (375 mg total) by mouth 2 (two) times daily. Patient not taking: Reported on 11/04/2022 09/27/22   Kommor, Wyn Forster, MD  ondansetron (ZOFRAN-ODT) 4 MG disintegrating tablet Take 1 tablet (4 mg total) by mouth every 8 (eight) hours as needed for nausea or vomiting. Patient not taking: Reported on 11/04/2022 09/12/22   Lilland, Alana, DO  ondansetron (ZOFRAN-ODT) 4 MG disintegrating tablet Take 1 tablet (4 mg total) by mouth every 8 (eight) hours as needed for nausea or vomiting. Patient not taking: Reported on 11/04/2022 09/27/22   Kommor, Wyn Forster, MD  pantoprazole (PROTONIX) 20 MG tablet Take 1 tablet (20 mg total) by mouth daily. Patient not taking: Reported on 11/04/2022 09/12/22   Lilland, Alana, DO  senna-docusate (SENOKOT-S) 8.6-50 MG tablet Take 2 tablets by mouth 2 (two) times daily as needed for mild constipation. Patient not taking: Reported on 09/30/2022 09/23/21   Essie Hart, MD  sertraline (ZOLOFT) 50 MG tablet Take 50 mg by mouth daily. Patient not taking:  Reported on 09/07/2022 08/27/21   [provider]    Family History Family History  Problem Relation Age of Onset   Kidney disease Mother    Lupus Mother    Vision loss Father    Hypertension Father    Cancer Sister    Asthma Brother    Diabetes Maternal Grandfather    Heart disease Neg Hx     Social History Social History   Tobacco Use   Smoking status: Never    Passive exposure: Never   Smokeless tobacco: Never  Vaping Use   Vaping status: Never Used  Substance Use Topics  Alcohol use: No    Alcohol/week: 0.0 standard drinks of alcohol   Drug use: No     Allergies   Patient has no known allergies.   Review of Systems Review of Systems  Constitutional:  Positive for chills and fatigue. Negative for fever.  HENT:  Positive for congestion, rhinorrhea, sore throat and trouble swallowing.   Respiratory:  Negative for cough, chest tightness, shortness of breath and wheezing.   Cardiovascular:  Negative for chest pain.  Gastrointestinal:  Negative for abdominal pain, constipation, nausea and vomiting.  Skin:  Negative for color change.  Neurological:  Negative for dizziness, weakness, light-headedness and headaches.     Physical Exam Triage Vital Signs ED Triage Vitals [12/17/22 1540]  Encounter Vitals Group     BP 132/82     Systolic BP Percentile      Diastolic BP Percentile      Pulse Rate (!) 104     Resp 18     Temp 99.8 F (37.7 C)     Temp Source Oral     SpO2 99 %     Weight      Height      Head Circumference      Peak Flow      Pain Score 8     Pain Loc      Pain Education      Exclude from Growth Chart    No data found.  Updated Vital Signs BP 132/82 (BP Location: Left Arm)   Pulse (!) 104   Temp 99.8 F (37.7 C) (Oral)   Resp 18   SpO2 99%   Breastfeeding Unknown   Visual Acuity Right Eye Distance:   Left Eye Distance:   Bilateral Distance:    Right Eye Near:   Left Eye Near:    Bilateral Near:     Physical  Exam Vitals and nursing note reviewed.  Constitutional:      General: She is awake. She is not in acute distress.    Appearance: Normal appearance. She is well-developed and well-groomed. She is not ill-appearing or diaphoretic.  HENT:     Right Ear: Tympanic membrane, ear canal and external ear normal.     Left Ear: Tympanic membrane, ear canal and external ear normal.     Nose: Congestion and rhinorrhea present.     Right Sinus: No maxillary sinus tenderness or frontal sinus tenderness.     Left Sinus: No maxillary sinus tenderness or frontal sinus tenderness.     Mouth/Throat:     Mouth: Mucous membranes are moist.     Pharynx: Pharyngeal swelling, oropharyngeal exudate, posterior oropharyngeal erythema and postnasal drip present.     Tonsils: Tonsillar exudate present. No tonsillar abscesses.  Cardiovascular:     Rate and Rhythm: Normal rate.     Heart sounds: Normal heart sounds.  Pulmonary:     Effort: Pulmonary effort is normal.     Breath sounds: Normal breath sounds.  Musculoskeletal:     Cervical back: Normal range of motion.  Skin:    General: Skin is warm and dry.  Neurological:     Mental Status: She is alert.  Psychiatric:        Behavior: Behavior is cooperative.      UC Treatments / Results  Labs (all labs ordered are listed, but only abnormal results are displayed) Labs Reviewed  POCT RAPID STREP A (OFFICE) - Abnormal; Notable for the following components:      Result Value  Rapid Strep A Screen Positive (*)    All other components within normal limits    EKG   Radiology No results found.  Procedures Procedures (including critical care time)  Medications Ordered in UC Medications - No data to display  Initial Impression / Assessment and Plan / UC Course  I have reviewed the triage vital signs and the nursing notes.  Pertinent labs & imaging results that were available during my care of the patient were reviewed by me and considered in my  medical decision making (see chart for details).     Patient presented with 1 day history of sore throat, congestion, fatigue.  Denies cough, shortness of breath, fever, abdominal pain, nausea, vomiting, and diarrhea.  Reports taking NyQuil, TheraFlu, and Tylenol with minimal relief of symptoms. Patient endorses trouble swallowing due to sore throat. Upon assessment throat and tonsils appear edematous, erythematous, and exudate present. Rapid strep was positive.  Prescribed penicillin. Prescribed lidocaine as needed for sore throat. Recommended alternating between Tylenol and ibuprofen for pain and fever. Recommended Flonase and Zyrtec for congestion. Discussed return precautions. Final Clinical Impressions(s) / UC Diagnoses   Final diagnoses:  Strep pharyngitis  Sore throat     Discharge Instructions      Please start taking penicillin as prescribed until finished. You can use lidocaine for sore throat, just make sure you gargle and spit this. You can alternate between Tylenol and ibuprofen for pain and fever. You can use over-the-counter Flonase spray and take it daily Zyrtec for congestion.    ED Prescriptions     Medication Sig Dispense Auth. Provider   penicillin v potassium (VEETID) 500 MG tablet Take 1 tablet (500 mg total) by mouth 2 (two) times daily for 10 days. 20 tablet Wynonia Lawman A, NP   lidocaine (XYLOCAINE) 2 % solution Use as directed 15 mLs in the mouth or throat as needed (sore throat). 100 mL Wynonia Lawman A, NP      PDMP not reviewed this encounter.   Wynonia Lawman A, NP 12/17/22 1640

## 2022-12-17 NOTE — Discharge Instructions (Signed)
Please start taking penicillin as prescribed until finished. You can use lidocaine for sore throat, just make sure you gargle and spit this. You can alternate between Tylenol and ibuprofen for pain and fever. You can use over-the-counter Flonase spray and take it daily Zyrtec for congestion.

## 2022-12-17 NOTE — ED Triage Notes (Signed)
Pt c/o sore throat that started yesterday. Taken Nyquil, Theraflu, and tylenol. Reports hurts really bad to eat.

## 2023-01-17 ENCOUNTER — Encounter (HOSPITAL_COMMUNITY): Payer: Self-pay

## 2023-01-17 ENCOUNTER — Other Ambulatory Visit: Payer: Self-pay

## 2023-01-17 ENCOUNTER — Emergency Department (HOSPITAL_COMMUNITY)
Admission: EM | Admit: 2023-01-17 | Discharge: 2023-01-18 | Discharge status: Against Medical Advice | Payer: MEDICAID | Attending: Emergency Medicine | Admitting: Emergency Medicine

## 2023-01-17 ENCOUNTER — Ambulatory Visit (HOSPITAL_COMMUNITY)
Admission: EM | Admit: 2023-01-17 | Discharge: 2023-01-17 | Disposition: A | Discharge status: Home / Self Care | Payer: MEDICAID | Attending: Family Medicine | Admitting: Family Medicine

## 2023-01-17 DIAGNOSIS — R404 Transient alteration of awareness: Secondary | ICD-10-CM | POA: Diagnosis not present

## 2023-01-17 DIAGNOSIS — R55 Syncope and collapse: Secondary | ICD-10-CM | POA: Insufficient documentation

## 2023-01-17 DIAGNOSIS — Z5321 Procedure and treatment not carried out due to patient leaving prior to being seen by health care provider: Secondary | ICD-10-CM | POA: Diagnosis not present

## 2023-01-17 DIAGNOSIS — R109 Unspecified abdominal pain: Secondary | ICD-10-CM | POA: Diagnosis present

## 2023-01-17 DIAGNOSIS — R110 Nausea: Secondary | ICD-10-CM | POA: Diagnosis not present

## 2023-01-17 LAB — CBC WITH DIFFERENTIAL/PLATELET
Abs Immature Granulocytes: 0 10*3/uL (ref 0.00–0.07)
Basophils Absolute: 0 10*3/uL (ref 0.0–0.1)
Basophils Relative: 1 %
Eosinophils Absolute: 0 10*3/uL (ref 0.0–0.5)
Eosinophils Relative: 1 %
HCT: 40.8 % (ref 36.0–46.0)
Hemoglobin: 12.7 g/dL (ref 12.0–15.0)
Immature Granulocytes: 0 %
Lymphocytes Relative: 46 %
Lymphs Abs: 2.8 10*3/uL (ref 0.7–4.0)
MCH: 25.1 pg — ABNORMAL LOW (ref 26.0–34.0)
MCHC: 31.1 g/dL (ref 30.0–36.0)
MCV: 80.8 fL (ref 80.0–100.0)
Monocytes Absolute: 0.5 10*3/uL (ref 0.1–1.0)
Monocytes Relative: 8 %
Neutro Abs: 2.7 10*3/uL (ref 1.7–7.7)
Neutrophils Relative %: 44 %
Platelets: 235 10*3/uL (ref 150–400)
RBC: 5.05 MIL/uL (ref 3.87–5.11)
RDW: 13.9 % (ref 11.5–15.5)
WBC: 6 10*3/uL (ref 4.0–10.5)
nRBC: 0 % (ref 0.0–0.2)

## 2023-01-17 LAB — BASIC METABOLIC PANEL
Anion gap: 9 (ref 5–15)
BUN: 5 mg/dL — ABNORMAL LOW (ref 6–20)
CO2: 22 mmol/L (ref 22–32)
Calcium: 9.3 mg/dL (ref 8.9–10.3)
Chloride: 106 mmol/L (ref 98–111)
Creatinine, Ser: 0.61 mg/dL (ref 0.44–1.00)
GFR, Estimated: >60 mL/min (ref >60)
Glucose, Bld: 86 mg/dL (ref 70–99)
Potassium: 3.5 mmol/L (ref 3.5–5.1)
Sodium: 137 mmol/L (ref 135–145)

## 2023-01-17 LAB — CBG MONITORING, ED: Glucose-Capillary: 95 mg/dL (ref 70–99)

## 2023-01-17 LAB — POCT FASTING CBG KUC MANUAL ENTRY: POCT Glucose (KUC): 82 mg/dL (ref 70–99)

## 2023-01-17 NOTE — ED Notes (Signed)
Patient called for vital signs w/ no answer x3. Dragging OTF.

## 2023-01-17 NOTE — ED Notes (Signed)
Called charge nurse at [facility], Aundra Millet made aware pt coming by EMS

## 2023-01-17 NOTE — ED Provider Triage Note (Signed)
Emergency Medicine Provider Triage Evaluation Note  Stephanie Mack , a 32 y.o. female  was evaluated in triage.  Pt complains of abdominal pain, syncope. She states that she started having abdominal pain yesterday, it is in her low pelvic region.  Associated nausea without vomiting or diarrhea.  No history of similar symptoms previously.  History of hernia repair and C-section previously.  Went to urgent care for symptoms and had a syncopal episode in the lobby.  She was sitting in a chair when she lost consciousness.  She did not fall or hit her head.  She states that she felt hot and lightheaded before she had the syncopal episode.  Denies any chest pain or shortness of breath.  Still having discomfort in her lower pelvic region, denies any other symptoms at this time.  Review of Systems  Positive:  Negative:   Physical Exam  BP (!) 147/106 (BP Location: Right Arm)   Pulse 67   Temp 98.4 F (36.9 C)   Resp 18   Ht 5\' 2"  (1.575 m)   Wt 68.3 kg   SpO2 100%   BMI 27.54 kg/m  Gen:   Awake, no distress   Resp:  Normal effort  MSK:   Moves extremities without difficulty  Other:    Medical Decision Making  Medically screening exam initiated at 6:42 PM.  Appropriate orders placed.  Fancy Dunkley was informed that the remainder of the evaluation will be completed by another provider, this initial triage assessment does not replace that evaluation, and the importance of remaining in the ED until their evaluation is complete.     Silva Bandy, PA-C 01/17/23 1844

## 2023-01-17 NOTE — ED Triage Notes (Addendum)
Called from the nurse station to access patient. Pt was unresponsive.Provider called for assistance to front to access patient. Provider requested to call EMS. Pt care taker states patient started with vomiting and diarrhea today. Blood pressure and CBG collected.

## 2023-01-17 NOTE — ED Triage Notes (Signed)
Pt arrived via [facility] from [facility]. Pt was in lobby waiting to be seen for RLQ, LLQ abdominal pain, N/V started yesterday, when pt had a syncopal episode in lobby. Per EMS, the pt was A&Ox4 when they got there. Per EMS, pt did not fall out of chair.

## 2023-01-17 NOTE — ED Provider Notes (Signed)
[facility]   [id number] 01/17/23 Arrival Time: 1712  ASSESSMENT & PLAN:  1. Episode of unresponsiveness    Pt checking in here when became unresponsive in chair. Brought to triage room. BP initially elevated; second check around 120/88. CBG 82. Drooling with eyelid fluttering. Within 1-2 minutes she was able to squeeze my hand and respond to verbal commands. Still not speaking. PERRLA. Initially with mild tachypnea. CV: RRR. H/O seizure disorder/HTN/depression/DM. No h/o suicide attempts. Family member does not know of any substance abuse problems in the past. Family member here with her. Reports she was vomiting earlier today and complained of lower abdominal pain.   BP (!) 155/111 (BP Location: Left Arm)   Pulse (!) 103   Resp (!) 22   SpO2 100%   Breastfeeding Unknown Comment: Tubal  Question seizure. To ED via [facility]. Stable upon discharge.  Reviewed expectations re: course of current medical issues. Questions answered. Outlined signs and symptoms indicating need for more acute intervention. Patient verbalized understanding. After Visit Summary given.   OBJECTIVE:  Vitals:   01/17/23 1729  BP: (!) 155/111  Pulse: (!) 103  Resp: (!) 22  SpO2: 100%    General appearance: minimally responsive  Labs: Results for orders placed or performed during the hospital encounter of 01/17/23  POC CBG monitoring  Result Value Ref Range   POCT Glucose ([facility]) 82 70 - 99 mg/dL   Labs Reviewed  POCT FASTING CBG [facility] MANUAL ENTRY    Past Medical History:  Diagnosis Date   ADHD (attention deficit hyperactivity disorder)    Anemia    Anxiety    Bipolar 1 disorder (HCC)    Chlamydia 05/31/2010   Chronic constipation    Complication of anesthesia    Depression    depression   Dysrhythmia    Fibroid    GERD (gastroesophageal reflux disease)    with pregnancy   Gestational diabetes    current pregnancy   Gonorrhea contact, treated    Headache    Hypertension     Infection    UTI   PONV (postoperative nausea and vomiting)    Pregnancy induced hypertension    previous pregnancy   Pseudocyesis 2013   Seen in MAU for percieved FM, abd distension. Normal exam.    Sickle cell trait (HCC)    Type 2 diabetes mellitus (HCC) 02/13/2017   Social History   Socioeconomic History   Marital status: Single    Spouse name: Not on file   Number of children: Not on file   Years of education: Not on file   Highest education level: 12th grade  Occupational History   Not on file  Tobacco Use   Smoking status: Never    Passive exposure: Never   Smokeless tobacco: Never  Vaping Use   Vaping status: Never Used  Substance and Sexual Activity   Alcohol use: No    Alcohol/week: 0.0 standard drinks of alcohol   Drug use: No   Sexual activity: Not Currently    Partners: Male    Birth control/protection: None  Other Topics Concern   Not on file  Social History Narrative   Single, one daughter born 2016          Social Determinants of Health   Financial Resource Strain: Low Risk  (08/07/2022)   Overall Financial Resource Strain (CARDIA)    Difficulty of Paying Living Expenses: Not hard at all  Food Insecurity: No Food Insecurity (08/07/2022)   Hunger Vital Sign  Worried About Programme researcher, broadcasting/film/video in the Last Year: Never true    Ran Out of Food in the Last Year: Never true  Transportation Needs: No Transportation Needs (08/07/2022)   PRAPARE - Administrator, Civil Service (Medical): No    Lack of Transportation (Non-Medical): No  Physical Activity: Insufficiently Active (08/07/2022)   Exercise Vital Sign    Days of Exercise per Week: 2 days    Minutes of Exercise per Session: 20 min  Stress: No Stress Concern Present (08/07/2022)   [employer] of Occupational Health - Occupational Stress Questionnaire    Feeling of Stress : Not at all  Social Connections: Unknown (08/07/2022)   Social Connection and Isolation Panel [NHANES]    Frequency  of Communication with Friends and Family: More than three times a week    Frequency of Social Gatherings with Friends and Family: Never    Attends Religious Services: 1 to 4 times per year    Active Member of [employer] or Organizations: No    Attends Banker Meetings: Not on file    Marital Status: Patient declined  Intimate Partner Violence: Not At Risk (11/28/2017)   Humiliation, Afraid, Rape, and Kick questionnaire    Fear of Current or Ex-Partner: No    Emotionally Abused: No    Physically Abused: No    Sexually Abused: No   Family History  Problem Relation Age of Onset   Kidney disease Mother    Lupus Mother    Vision loss Father    Hypertension Father    Cancer Sister    Asthma Brother    Diabetes Maternal Grandfather    Heart disease Neg Hx    Past Surgical History:  Procedure Laterality Date   CESAREAN SECTION N/A 09/01/2012   Procedure:  Primary cesarean section with delivery of baby girl at 56.  Apgars 1/1.  ;  Surgeon: Kathreen Cosier, MD;  Location: [facility];  Service: Obstetrics;  Laterality: N/A;   CESAREAN SECTION N/A 01/07/2015   Procedure: REPEAT CESAREAN SECTION;  Surgeon: Brock Bad, MD;  Location: [facility];  Service: Obstetrics;  Laterality: N/A;   CESAREAN SECTION WITH BILATERAL TUBAL LIGATION N/A 09/21/2021   Procedure: CESAREAN SECTION WITH BILATERAL TUBAL LIGATION;  Surgeon: Essie Hart, MD;  Location: [facility];  Service: Obstetrics;  Laterality: N/A;   DILATION AND CURETTAGE OF UTERUS     NASAL SEPTUM SURGERY     UMBILICAL HERNIA REPAIR N/A 11/21/2022   Procedure: HERNIA REPAIR UMBILICAL ADULT;  Surgeon: Quentin Ore, MD;  Location: [facility];  Service: General;  Laterality: N/A;   WISDOM TOOTH EXTRACTION        Mardella Layman, MD 01/17/23 1745

## 2023-01-26 NOTE — Plan of Care (Signed)
[facility] Tonsillectomy/Adenoidectomy, Postoperative PEDS care plan entered in error.

## 2023-02-04 ENCOUNTER — Encounter (HOSPITAL_COMMUNITY): Payer: Self-pay

## 2023-02-04 ENCOUNTER — Other Ambulatory Visit: Payer: Self-pay

## 2023-02-04 ENCOUNTER — Emergency Department (HOSPITAL_COMMUNITY)
Admission: EM | Admit: 2023-02-04 | Discharge: 2023-02-04 | Disposition: A | Discharge status: Home / Self Care | Payer: MEDICAID | Attending: Emergency Medicine | Admitting: Emergency Medicine

## 2023-02-04 ENCOUNTER — Emergency Department (HOSPITAL_COMMUNITY): Payer: MEDICAID

## 2023-02-04 DIAGNOSIS — R410 Disorientation, unspecified: Secondary | ICD-10-CM | POA: Insufficient documentation

## 2023-02-04 DIAGNOSIS — G40909 Epilepsy, unspecified, not intractable, without status epilepticus: Secondary | ICD-10-CM | POA: Diagnosis not present

## 2023-02-04 DIAGNOSIS — R569 Unspecified convulsions: Secondary | ICD-10-CM

## 2023-02-04 LAB — COMPREHENSIVE METABOLIC PANEL
ALT: 14 U/L (ref 0–44)
AST: 15 U/L (ref 15–41)
Albumin: 3.9 g/dL (ref 3.5–5.0)
Alkaline Phosphatase: 58 U/L (ref 38–126)
Anion gap: 6 (ref 5–15)
BUN: 9 mg/dL (ref 6–20)
CO2: 22 mmol/L (ref 22–32)
Calcium: 8.9 mg/dL (ref 8.9–10.3)
Chloride: 109 mmol/L (ref 98–111)
Creatinine, Ser: 0.43 mg/dL — ABNORMAL LOW (ref 0.44–1.00)
GFR, Estimated: >60 mL/min (ref >60)
Glucose, Bld: 100 mg/dL — ABNORMAL HIGH (ref 70–99)
Potassium: 3.4 mmol/L — ABNORMAL LOW (ref 3.5–5.1)
Sodium: 137 mmol/L (ref 135–145)
Total Bilirubin: 1 mg/dL (ref 0.3–1.2)
Total Protein: 7.5 g/dL (ref 6.5–8.1)

## 2023-02-04 LAB — CBC WITH DIFFERENTIAL/PLATELET
Abs Immature Granulocytes: 0.08 10*3/uL — ABNORMAL HIGH (ref 0.00–0.07)
Basophils Absolute: 0 10*3/uL (ref 0.0–0.1)
Basophils Relative: 0 %
Eosinophils Absolute: 0 10*3/uL (ref 0.0–0.5)
Eosinophils Relative: 0 %
HCT: 43.9 % (ref 36.0–46.0)
Hemoglobin: 13.8 g/dL (ref 12.0–15.0)
Immature Granulocytes: 1 %
Lymphocytes Relative: 30 %
Lymphs Abs: 1.9 10*3/uL (ref 0.7–4.0)
MCH: 26 pg (ref 26.0–34.0)
MCHC: 31.4 g/dL (ref 30.0–36.0)
MCV: 82.8 fL (ref 80.0–100.0)
Monocytes Absolute: 0.4 10*3/uL (ref 0.1–1.0)
Monocytes Relative: 6 %
Neutro Abs: 4 10*3/uL (ref 1.7–7.7)
Neutrophils Relative %: 63 %
Platelets: 229 10*3/uL (ref 150–400)
RBC: 5.3 MIL/uL — ABNORMAL HIGH (ref 3.87–5.11)
RDW: 13.8 % (ref 11.5–15.5)
WBC: 6.4 10*3/uL (ref 4.0–10.5)
nRBC: 0 % (ref 0.0–0.2)

## 2023-02-04 LAB — RAPID URINE DRUG SCREEN, HOSP PERFORMED
Amphetamines: NOT DETECTED
Barbiturates: NOT DETECTED
Benzodiazepines: POSITIVE — AB
Cocaine: NOT DETECTED
Opiates: NOT DETECTED
Tetrahydrocannabinol: POSITIVE — AB

## 2023-02-04 LAB — ETHANOL: Alcohol, Ethyl (B): <10 mg/dL (ref <10)

## 2023-02-04 LAB — HCG, QUANTITATIVE, PREGNANCY: hCG, Beta Chain, Quant, S: <1 m[IU]/mL (ref <5)

## 2023-02-04 MED ORDER — LEVETIRACETAM 500 MG PO TABS: 500.0000 mg | ORAL_TABLET | Freq: Two times a day (BID) | ORAL | 1 refills | Status: DC

## 2023-02-04 MED ORDER — LEVETIRACETAM IN NACL 1000 MG/100ML IV SOLN
1000.0000 mg | Freq: Once | INTRAVENOUS | Status: AC
  Administered 2023-02-04: 1000 mg via INTRAVENOUS
  Filled 2023-02-04: qty 100

## 2023-02-04 NOTE — Discharge Instructions (Signed)
Do not do any driving or put yourself in a position where you can get hurt if you have a seizure.  Follow-up with neurologist in the next month

## 2023-02-04 NOTE — ED Provider Notes (Signed)
[place] EMERGENCY DEPARTMENT AT [facility] Provider Note   CSN: [id number] Arrival date & time: 02/04/23  1313     History {Add pertinent medical, surgical, social history, OB history to HPI:1} Chief Complaint  Patient presents with   Seizures    Stephanie Mack is a 32 y.o. female.  Patient has a history of seizures.  She had a seizure today and the seizure about a month ago.  Patient states she takes gabapentin for seizures and does not remember if she is seeing a neurologist.  The history is provided by the patient and medical records. No language interpreter was used.  Seizures Seizure activity on arrival: no   Seizure type:  Unable to specify Preceding symptoms: no sensation of an aura present   Initial focality:  None Episode characteristics: abnormal movements   Postictal symptoms: confusion   Return to baseline: yes   Severity:  Moderate Timing:  Once Progression:  Resolved      Home Medications Prior to Admission medications   Medication Sig Start Date End Date Taking? Authorizing Provider  levETIRAcetam (KEPPRA) 500 MG tablet Take 1 tablet (500 mg total) by mouth 2 (two) times daily. 02/04/23  Yes Bethann Berkshire, MD  amLODipine (NORVASC) 5 MG tablet Take 1 tablet (5 mg total) by mouth at bedtime. Patient not taking: Reported on 09/30/2022 01/11/22   Jerre Simon, MD  gabapentin (NEURONTIN) 100 MG capsule Take 100 mg by mouth 2 (two) times daily. 10/05/22   [provider]  ibuprofen (ADVIL) 600 MG tablet Take 1 tablet (600 mg total) by mouth every 6 (six) hours as needed for moderate pain. Patient not taking: Reported on 11/04/2022 01/11/22   Jerre Simon, MD  ibuprofen (ADVIL) 800 MG tablet Take 800 mg by mouth 2 (two) times daily as needed. 10/05/22   [provider]  lidocaine (XYLOCAINE) 2 % solution Use as directed 15 mLs in the mouth or throat as needed (sore throat). 12/17/22   Wynonia Lawman A, NP  medroxyPROGESTERone  (PROVERA) 10 MG tablet Take 2 tablets (20 mg total) by mouth 3 (three) times daily for 10 days. Patient not taking: Reported on 04/10/2022 01/15/22 01/25/22  Jerre Simon, MD  megestrol (MEGACE) 20 MG tablet Take 1 tablet (20 mg total) by mouth daily. Patient not taking: Reported on 09/30/2022 04/10/22   Carlynn Herald, CNM  metFORMIN (GLUMETZA) 1000 MG (MOD) 24 hr tablet Take 1 tablet, by mouth, after breakfast and after supper. Patient not taking: Reported on 09/07/2022 01/11/22   Jerre Simon, MD  naproxen (NAPROSYN) 375 MG tablet Take 1 tablet (375 mg total) by mouth 2 (two) times daily. Patient not taking: Reported on 11/04/2022 09/27/22   Kommor, Wyn Forster, MD  ondansetron (ZOFRAN-ODT) 4 MG disintegrating tablet Take 1 tablet (4 mg total) by mouth every 8 (eight) hours as needed for nausea or vomiting. Patient not taking: Reported on 11/04/2022 09/12/22   Lilland, Alana, DO  ondansetron (ZOFRAN-ODT) 4 MG disintegrating tablet Take 1 tablet (4 mg total) by mouth every 8 (eight) hours as needed for nausea or vomiting. Patient not taking: Reported on 11/04/2022 09/27/22   Kommor, Wyn Forster, MD  pantoprazole (PROTONIX) 20 MG tablet Take 1 tablet (20 mg total) by mouth daily. Patient not taking: Reported on 11/04/2022 09/12/22   Lilland, Alana, DO  senna-docusate (SENOKOT-S) 8.6-50 MG tablet Take 2 tablets by mouth 2 (two) times daily as needed for mild constipation. Patient not taking: Reported on 09/30/2022 09/23/21   Essie Hart, MD  sertraline (ZOLOFT) 50 MG tablet Take 50 mg by mouth daily. Patient not taking: Reported on 09/07/2022 08/27/21   [provider]      Allergies    Patient has no known allergies.    Review of Systems   Review of Systems  Constitutional:  Negative for appetite change and fatigue.  HENT:  Negative for congestion, ear discharge and sinus pressure.   Eyes:  Negative for discharge.  Respiratory:  Negative for cough.   Cardiovascular:  Negative for chest pain.   Gastrointestinal:  Negative for abdominal pain and diarrhea.  Genitourinary:  Negative for frequency and hematuria.  Musculoskeletal:  Negative for back pain.  Skin:  Negative for rash.  Neurological:  Positive for seizures. Negative for headaches.  Psychiatric/Behavioral:  Negative for hallucinations.     Physical Exam Updated Vital Signs BP (!) 141/94   Pulse 86   Temp 98.6 F (37 C) (Oral)   Resp 15   Ht 5\' 2"  (1.575 m)   Wt 68.3 kg   SpO2 100%   BMI 27.54 kg/m  Physical Exam Vitals and nursing note reviewed.  Constitutional:      Appearance: She is well-developed.  HENT:     Head: Normocephalic.     Nose: Nose normal.  Eyes:     General: No scleral icterus.    Conjunctiva/sclera: Conjunctivae normal.  Neck:     Thyroid: No thyromegaly.  Cardiovascular:     Rate and Rhythm: Normal rate and regular rhythm.     Heart sounds: No murmur heard.    No friction rub. No gallop.  Pulmonary:     Breath sounds: No stridor. No wheezing or rales.  Chest:     Chest wall: No tenderness.  Abdominal:     General: There is no distension.     Tenderness: There is no abdominal tenderness. There is no rebound.  Musculoskeletal:        General: Normal range of motion.     Cervical back: Neck supple.  Lymphadenopathy:     Cervical: No cervical adenopathy.  Skin:    Findings: No erythema or rash.  Neurological:     Mental Status: She is alert and oriented to person, place, and time.     Motor: No abnormal muscle tone.     Coordination: Coordination normal.  Psychiatric:        Behavior: Behavior normal.     ED Results / Procedures / Treatments   Labs (all labs ordered are listed, but only abnormal results are displayed) Labs Reviewed  CBC WITH DIFFERENTIAL/PLATELET - Abnormal; Notable for the following components:      Result Value   RBC 5.30 (*)    Abs Immature Granulocytes 0.08 (*)    All other components within normal limits  COMPREHENSIVE METABOLIC PANEL -  Abnormal; Notable for the following components:   Potassium 3.4 (*)    Glucose, Bld 100 (*)    Creatinine, Ser 0.43 (*)    All other components within normal limits  RAPID URINE DRUG SCREEN, HOSP PERFORMED - Abnormal; Notable for the following components:   Benzodiazepines POSITIVE (*)    Tetrahydrocannabinol POSITIVE (*)    All other components within normal limits  HCG, QUANTITATIVE, PREGNANCY  ETHANOL    EKG None  Radiology CT Head Wo Contrast  Result Date: 02/04/2023 CLINICAL DATA:  Seizure, new-onset, no history of trauma EXAM: CT HEAD WITHOUT CONTRAST TECHNIQUE: Contiguous axial images were obtained from the base of the skull through the  vertex without intravenous contrast. RADIATION DOSE REDUCTION: This exam was performed according to the departmental dose-optimization program which includes automated exposure control, adjustment of the mA and/or kV according to patient size and/or use of iterative reconstruction technique. COMPARISON:  10/18/2022 FINDINGS: Brain: No evidence of acute infarction, hemorrhage, hydrocephalus, extra-axial collection or mass lesion/mass effect. Vascular: No hyperdense vessel or unexpected calcification. Skull: Normal. Negative for fracture or focal lesion. Sinuses/Orbits: No acute finding. Other: None. IMPRESSION: No acute intracranial findings. Electronically Signed   By: Duanne Guess D.O.   On: 02/04/2023 14:32    Procedures Procedures  {Document cardiac monitor, telemetry assessment procedure when appropriate:1}  Medications Ordered in ED Medications  levETIRAcetam (KEPPRA) IVPB 1000 mg/100 mL premix (1,000 mg Intravenous New Bag/Given 02/04/23 1638)    ED Course/ Medical Decision Making/ A&P   {   Click here for ABCD2, HEART and other calculatorsREFRESH Note before signing :1}                              Medical Decision Making Amount and/or Complexity of Data Reviewed Labs: ordered. Radiology: ordered. ECG/medicine tests:  ordered.  Risk Prescription drug management.   Patient with syncope and possible seizure.  She has a history of seizures.  She is taking gabapentin.  We will start her on Keppra and refer to neurology for further workup  {Document critical care time when appropriate:1} {Document review of labs and clinical decision tools ie heart score, Chads2Vasc2 etc:1}  {Document your independent review of radiology images, and any outside records:1} {Document your discussion with family members, caretakers, and with consultants:1} {Document social determinants of health affecting pt's care:1} {Document your decision making why or why not admission, treatments were needed:1} Final Clinical Impression(s) / ED Diagnoses Final diagnoses:  Seizure (HCC)    Rx / DC Orders ED Discharge Orders          Ordered    levETIRAcetam (KEPPRA) 500 MG tablet  2 times daily        02/04/23 1656    Ambulatory referral to Neurology       Comments: An appointment is requested in approximately: 4 weeks   02/04/23 1656

## 2023-02-04 NOTE — ED Triage Notes (Addendum)
Patient BIB EMS. Patient went unresponsive when she was in the car with her mother. On EMS arrival patient was actively seizing. Seizure lasted about 3 minutes. EMS gave 5 mg IM Versed. Patient has hx of seizures.  Patient is drowsy and A&Ox2 on arrival.

## 2023-02-16 ENCOUNTER — Encounter: Payer: Self-pay | Admitting: Neurology

## 2023-02-22 ENCOUNTER — Other Ambulatory Visit: Payer: Self-pay

## 2023-02-22 ENCOUNTER — Emergency Department (HOSPITAL_COMMUNITY): Payer: MEDICAID

## 2023-02-22 ENCOUNTER — Emergency Department (HOSPITAL_COMMUNITY)
Admission: EM | Admit: 2023-02-22 | Discharge: 2023-02-22 | Disposition: A | Discharge status: Home / Self Care | Payer: MEDICAID | Attending: Emergency Medicine | Admitting: Emergency Medicine

## 2023-02-22 ENCOUNTER — Encounter (HOSPITAL_COMMUNITY): Payer: Self-pay

## 2023-02-22 DIAGNOSIS — R569 Unspecified convulsions: Secondary | ICD-10-CM | POA: Insufficient documentation

## 2023-02-22 DIAGNOSIS — E119 Type 2 diabetes mellitus without complications: Secondary | ICD-10-CM | POA: Insufficient documentation

## 2023-02-22 DIAGNOSIS — Z7984 Long term (current) use of oral hypoglycemic drugs: Secondary | ICD-10-CM | POA: Insufficient documentation

## 2023-02-22 DIAGNOSIS — I10 Essential (primary) hypertension: Secondary | ICD-10-CM | POA: Diagnosis not present

## 2023-02-22 DIAGNOSIS — Z79899 Other long term (current) drug therapy: Secondary | ICD-10-CM | POA: Insufficient documentation

## 2023-02-22 LAB — COMPREHENSIVE METABOLIC PANEL
ALT: 15 U/L (ref 0–44)
AST: 15 U/L (ref 15–41)
Albumin: 3.8 g/dL (ref 3.5–5.0)
Alkaline Phosphatase: 51 U/L (ref 38–126)
Anion gap: 6 (ref 5–15)
BUN: 9 mg/dL (ref 6–20)
CO2: 22 mmol/L (ref 22–32)
Calcium: 8.8 mg/dL — ABNORMAL LOW (ref 8.9–10.3)
Chloride: 109 mmol/L (ref 98–111)
Creatinine, Ser: 0.52 mg/dL (ref 0.44–1.00)
GFR, Estimated: >60 mL/min (ref >60)
Glucose, Bld: 91 mg/dL (ref 70–99)
Potassium: 3.2 mmol/L — ABNORMAL LOW (ref 3.5–5.1)
Sodium: 137 mmol/L (ref 135–145)
Total Bilirubin: 0.5 mg/dL (ref <1.2)
Total Protein: 6.9 g/dL (ref 6.5–8.1)

## 2023-02-22 LAB — CBC WITH DIFFERENTIAL/PLATELET
Abs Immature Granulocytes: 0.01 10*3/uL (ref 0.00–0.07)
Basophils Absolute: 0 10*3/uL (ref 0.0–0.1)
Basophils Relative: 0 %
Eosinophils Absolute: 0 10*3/uL (ref 0.0–0.5)
Eosinophils Relative: 1 %
HCT: 38.2 % (ref 36.0–46.0)
Hemoglobin: 12.1 g/dL (ref 12.0–15.0)
Immature Granulocytes: 0 %
Lymphocytes Relative: 59 %
Lymphs Abs: 2.8 10*3/uL (ref 0.7–4.0)
MCH: 25.9 pg — ABNORMAL LOW (ref 26.0–34.0)
MCHC: 31.7 g/dL (ref 30.0–36.0)
MCV: 81.6 fL (ref 80.0–100.0)
Monocytes Absolute: 0.4 10*3/uL (ref 0.1–1.0)
Monocytes Relative: 8 %
Neutro Abs: 1.6 10*3/uL — ABNORMAL LOW (ref 1.7–7.7)
Neutrophils Relative %: 32 %
Platelets: 219 10*3/uL (ref 150–400)
RBC: 4.68 MIL/uL (ref 3.87–5.11)
RDW: 13.4 % (ref 11.5–15.5)
WBC: 4.9 10*3/uL (ref 4.0–10.5)
nRBC: 0 % (ref 0.0–0.2)

## 2023-02-22 LAB — URINALYSIS, ROUTINE W REFLEX MICROSCOPIC
Bilirubin Urine: NEGATIVE
Glucose, UA: NEGATIVE mg/dL
Hgb urine dipstick: NEGATIVE
Ketones, ur: 5 mg/dL — AB
Leukocytes,Ua: NEGATIVE
Nitrite: NEGATIVE
Protein, ur: NEGATIVE mg/dL
Specific Gravity, Urine: 1.008 (ref 1.005–1.030)
pH: 7 (ref 5.0–8.0)

## 2023-02-22 LAB — HCG, SERUM, QUALITATIVE: Preg, Serum: NEGATIVE

## 2023-02-22 LAB — MAGNESIUM: Magnesium: 2.1 mg/dL (ref 1.7–2.4)

## 2023-02-22 MED ORDER — LEVETIRACETAM 500 MG PO TABS: 500.0000 mg | ORAL_TABLET | Freq: Two times a day (BID) | ORAL | 1 refills | Status: DC

## 2023-02-22 MED ORDER — SODIUM CHLORIDE 0.9 % IV BOLUS
1000.0000 mL | Freq: Once | INTRAVENOUS | Status: AC
  Administered 2023-02-22: 1000 mL via INTRAVENOUS

## 2023-02-22 MED ORDER — LEVETIRACETAM IN NACL 1000 MG/100ML IV SOLN
1000.0000 mg | Freq: Once | INTRAVENOUS | Status: AC
  Administered 2023-02-22: 1000 mg via INTRAVENOUS
  Filled 2023-02-22: qty 100

## 2023-02-22 NOTE — ED Provider Notes (Signed)
[place] EMERGENCY DEPARTMENT AT [facility] Provider Note   CSN: [id number] Arrival date & time: 02/22/23  1650     History  Chief Complaint  Patient presents with   Seizures    Stephanie Mack is a 32 y.o. female.  Pt is a 32 yo female with pmhx significant for seizures, htn, depression, bipolar d/o, adhd, and dm2.  Pt said she has been having seizures for a few months.  She has not yet seen a neurologist.  Appt is next month.  She said she's only taking gabapentin for the seizures, but was seen here on 11/2 for seizures and was rx'd keppra.  Pt said she forgot to take the gabapentin today.  No fevers.  She denies injury from the seizure.       Home Medications Prior to Admission medications   Medication Sig Start Date End Date Taking? Authorizing Provider  amLODipine (NORVASC) 5 MG tablet Take 1 tablet (5 mg total) by mouth at bedtime. Patient not taking: Reported on 09/30/2022 01/11/22   Jerre Simon, MD  gabapentin (NEURONTIN) 100 MG capsule Take 100 mg by mouth 2 (two) times daily. 10/05/22   [provider]  ibuprofen (ADVIL) 600 MG tablet Take 1 tablet (600 mg total) by mouth every 6 (six) hours as needed for moderate pain. Patient not taking: Reported on 11/04/2022 01/11/22   Jerre Simon, MD  ibuprofen (ADVIL) 800 MG tablet Take 800 mg by mouth 2 (two) times daily as needed. 10/05/22   [provider]  levETIRAcetam (KEPPRA) 500 MG tablet Take 1 tablet (500 mg total) by mouth 2 (two) times daily. 02/22/23   Jacalyn Lefevre, MD  lidocaine (XYLOCAINE) 2 % solution Use as directed 15 mLs in the mouth or throat as needed (sore throat). 12/17/22   Wynonia Lawman A, NP  medroxyPROGESTERone (PROVERA) 10 MG tablet Take 2 tablets (20 mg total) by mouth 3 (three) times daily for 10 days. Patient not taking: Reported on 04/10/2022 01/15/22 01/25/22  Jerre Simon, MD  megestrol (MEGACE) 20 MG tablet Take 1 tablet (20 mg total) by mouth daily. Patient not  taking: Reported on 09/30/2022 04/10/22   Carlynn Herald, CNM  metFORMIN (GLUMETZA) 1000 MG (MOD) 24 hr tablet Take 1 tablet, by mouth, after breakfast and after supper. Patient not taking: Reported on 09/07/2022 01/11/22   Jerre Simon, MD  naproxen (NAPROSYN) 375 MG tablet Take 1 tablet (375 mg total) by mouth 2 (two) times daily. Patient not taking: Reported on 11/04/2022 09/27/22   Kommor, Wyn Forster, MD  ondansetron (ZOFRAN-ODT) 4 MG disintegrating tablet Take 1 tablet (4 mg total) by mouth every 8 (eight) hours as needed for nausea or vomiting. Patient not taking: Reported on 11/04/2022 09/12/22   Lilland, Alana, DO  ondansetron (ZOFRAN-ODT) 4 MG disintegrating tablet Take 1 tablet (4 mg total) by mouth every 8 (eight) hours as needed for nausea or vomiting. Patient not taking: Reported on 11/04/2022 09/27/22   Kommor, Wyn Forster, MD  pantoprazole (PROTONIX) 20 MG tablet Take 1 tablet (20 mg total) by mouth daily. Patient not taking: Reported on 11/04/2022 09/12/22   Lilland, Alana, DO  senna-docusate (SENOKOT-S) 8.6-50 MG tablet Take 2 tablets by mouth 2 (two) times daily as needed for mild constipation. Patient not taking: Reported on 09/30/2022 09/23/21   Essie Hart, MD  sertraline (ZOLOFT) 50 MG tablet Take 50 mg by mouth daily. Patient not taking: Reported on 09/07/2022 08/27/21   [provider]      Allergies  Patient has no known allergies.    Review of Systems   Review of Systems  Neurological:  Positive for seizures.  All other systems reviewed and are negative.   Physical Exam Updated Vital Signs BP 125/87   Pulse 76   Temp 98.7 F (37.1 C) (Oral)   Resp 17   SpO2 99%  Physical Exam Vitals and nursing note reviewed.  Constitutional:      Appearance: Normal appearance.  HENT:     Head: Normocephalic and atraumatic.     Right Ear: External ear normal.     Left Ear: External ear normal.     Nose: Nose normal.     Mouth/Throat:     Mouth: Mucous membranes are moist.      Pharynx: Oropharynx is clear.  Eyes:     Extraocular Movements: Extraocular movements intact.     Conjunctiva/sclera: Conjunctivae normal.     Pupils: Pupils are equal, round, and reactive to light.  Cardiovascular:     Rate and Rhythm: Normal rate and regular rhythm.     Pulses: Normal pulses.     Heart sounds: Normal heart sounds.  Pulmonary:     Effort: Pulmonary effort is normal.     Breath sounds: Normal breath sounds.  Abdominal:     General: Abdomen is flat. Bowel sounds are normal.     Palpations: Abdomen is soft.  Musculoskeletal:        General: Normal range of motion.     Cervical back: Normal range of motion and neck supple.  Skin:    General: Skin is warm.     Capillary Refill: Capillary refill takes less than 2 seconds.  Neurological:     General: No focal deficit present.     Mental Status: She is alert and oriented to person, place, and time.  Psychiatric:        Mood and Affect: Mood normal.        Behavior: Behavior normal.     ED Results / Procedures / Treatments   Labs (all labs ordered are listed, but only abnormal results are displayed) Labs Reviewed  COMPREHENSIVE METABOLIC PANEL - Abnormal; Notable for the following components:      Result Value   Potassium 3.2 (*)    Calcium 8.8 (*)    All other components within normal limits  CBC WITH DIFFERENTIAL/PLATELET - Abnormal; Notable for the following components:   MCH 25.9 (*)    Neutro Abs 1.6 (*)    All other components within normal limits  URINALYSIS, ROUTINE W REFLEX MICROSCOPIC - Abnormal; Notable for the following components:   Color, Urine STRAW (*)    Ketones, ur 5 (*)    All other components within normal limits  HCG, SERUM, QUALITATIVE  MAGNESIUM  CBG MONITORING, ED    EKG EKG Interpretation Date/Time:  Wednesday February 22 2023 19:18:01 EST Ventricular Rate:  68 PR Interval:  161 QRS Duration:  88 QT Interval:  410 QTC Calculation: 436 R Axis:   79  Text  Interpretation: Sinus rhythm Borderline T abnormalities, anterior leads No significant change since last tracing Confirmed by Jacalyn Lefevre [phone]) on 02/22/2023 8:31:07 PM  Radiology DG Chest Portable 1 View  Result Date: 02/22/2023 CLINICAL DATA:  Seizure for 2 minutes, lethargy, nausea EXAM: PORTABLE CHEST 1 VIEW COMPARISON:  05/18/2022 FINDINGS: The heart size and mediastinal contours are within normal limits. Both lungs are clear. The visualized skeletal structures are unremarkable. IMPRESSION: No active disease. Electronically Signed  By: Sharlet Salina M.D.   On: 02/22/2023 20:27    Procedures Procedures    Medications Ordered in ED Medications  sodium chloride 0.9 % bolus 1,000 mL (0 mLs Intravenous Stopped 02/22/23 1913)  levETIRAcetam (KEPPRA) IVPB 1000 mg/100 mL premix (0 mg Intravenous Stopped 02/22/23 1807)    ED Course/ Medical Decision Making/ A&P                                 Medical Decision Making Amount and/or Complexity of Data Reviewed Labs: ordered. Radiology: ordered.  Risk Prescription drug management.   This patient presents to the ED for concern of seizure, this involves an extensive number of treatment options, and is a complaint that carries with it a high risk of complications and morbidity.  The differential diagnosis includes seizure, pseudoseizure, electrolyte abn, infection   Co morbidities that complicate the patient evaluation  seizures, htn, depression, bipolar d/o, adhd, and dm2   Additional history obtained:  Additional history obtained from epic chart review External records from outside source obtained and reviewed including EMS report   Lab Tests:  I Ordered, and personally interpreted labs.  The pertinent results include:  cmp nl other than k sl low at 3.2, ua + ketones, cbc nl, mg nl, preg neg  CXR: No active disease. CXR reviewed by me.  I agree with the radiologist.  Cardiac Monitoring:  The patient was maintained  on a cardiac monitor.  I personally viewed and interpreted the cardiac monitored which showed an underlying rhythm of: nsr   Medicines ordered and prescription drug management:  I ordered medication including keppra  for seizure  Reevaluation of the patient after these medicines showed that the patient improved I have reviewed the patients home medicines and have made adjustments as needed   Critical Interventions:  keppra   Problem List / ED Course:  Seizure:  pt is now awake and alert and back to baseline.  She has been taking the keppra, but forgot to take that as well today.  Pt given a dose in the ED.  She does not have an appt with neuro (Dr. Karel Jarvis on 12/30)for over a month and her keppra will run out before then, so I sent in another rx.  She does not drive.  She is to return if worse.    Reevaluation:  After the interventions noted above, I reevaluated the patient and found that they have :improved   Social Determinants of Health:  Lives at home   Dispostion:  After consideration of the diagnostic results and the patients response to treatment, I feel that the patent would benefit from discharge with f/u.          Final Clinical Impression(s) / ED Diagnoses Final diagnoses:  Seizure [facility])    Rx / DC Orders ED Discharge Orders          Ordered    levETIRAcetam (KEPPRA) 500 MG tablet  2 times daily        02/22/23 2031              Jacalyn Lefevre, MD 02/22/23 2031

## 2023-02-22 NOTE — ED Notes (Signed)
Pt states that she went to the bathroom, but forgot to take the specimen cup with her- no urine at this time

## 2023-02-22 NOTE — ED Triage Notes (Signed)
PT BIB EMS from home for a witnessed seizure lasting approximately 2 minutes.   Pt is lethargic and endorses abdominal pain and nausea  Pt denies fall, or any mechanical injury.  Pt is A&O x 3 and in no obvious distress

## 2023-03-14 ENCOUNTER — Emergency Department (HOSPITAL_COMMUNITY)
Admission: EM | Admit: 2023-03-14 | Discharge: 2023-03-14 | Discharge status: Against Medical Advice | Payer: MEDICAID | Attending: Emergency Medicine | Admitting: Emergency Medicine

## 2023-03-14 ENCOUNTER — Other Ambulatory Visit: Payer: Self-pay

## 2023-03-14 DIAGNOSIS — R569 Unspecified convulsions: Secondary | ICD-10-CM | POA: Diagnosis present

## 2023-03-14 DIAGNOSIS — Z5321 Procedure and treatment not carried out due to patient leaving prior to being seen by health care provider: Secondary | ICD-10-CM | POA: Insufficient documentation

## 2023-03-14 LAB — CBC
HCT: 39.5 % (ref 36.0–46.0)
Hemoglobin: 12.7 g/dL (ref 12.0–15.0)
MCH: 26.6 pg (ref 26.0–34.0)
MCHC: 32.2 g/dL (ref 30.0–36.0)
MCV: 82.8 fL (ref 80.0–100.0)
Platelets: 206 10*3/uL (ref 150–400)
RBC: 4.77 MIL/uL (ref 3.87–5.11)
RDW: 13.6 % (ref 11.5–15.5)
WBC: 5.9 10*3/uL (ref 4.0–10.5)
nRBC: 0 % (ref 0.0–0.2)

## 2023-03-14 LAB — BASIC METABOLIC PANEL
Anion gap: 8 (ref 5–15)
BUN: 10 mg/dL (ref 6–20)
CO2: 22 mmol/L (ref 22–32)
Calcium: 9 mg/dL (ref 8.9–10.3)
Chloride: 108 mmol/L (ref 98–111)
Creatinine, Ser: 0.62 mg/dL (ref 0.44–1.00)
GFR, Estimated: >60 mL/min (ref >60)
Glucose, Bld: 95 mg/dL (ref 70–99)
Potassium: 3.1 mmol/L — ABNORMAL LOW (ref 3.5–5.1)
Sodium: 138 mmol/L (ref 135–145)

## 2023-03-14 LAB — HCG, SERUM, QUALITATIVE: Preg, Serum: NEGATIVE

## 2023-03-14 LAB — CBG MONITORING, ED: Glucose-Capillary: 107 mg/dL — ABNORMAL HIGH (ref 70–99)

## 2023-03-14 NOTE — ED Triage Notes (Addendum)
Patient BIB EMS from home c/o witnessed seizures. Per report pt had seizure for 5 mins. Midazolam 5mg  IM Given by EMS. Pt hx of seizures. No report of hitter her head in the floor. Pt non compliant with her seizures meds.

## 2023-03-14 NOTE — ED Notes (Addendum)
Pt stated "things were taking too long and I want to leave". RN and staff attempted to educate pt on the importance of staying. Pt stated "I still want to leave". Preston Fleeting, MD notified of pt's elopement

## 2023-03-19 ENCOUNTER — Emergency Department (HOSPITAL_COMMUNITY)
Admission: EM | Admit: 2023-03-19 | Discharge: 2023-03-19 | Disposition: A | Discharge status: Home / Self Care | Payer: MEDICAID | Attending: Emergency Medicine | Admitting: Emergency Medicine

## 2023-03-19 DIAGNOSIS — E876 Hypokalemia: Secondary | ICD-10-CM | POA: Insufficient documentation

## 2023-03-19 DIAGNOSIS — R569 Unspecified convulsions: Secondary | ICD-10-CM | POA: Insufficient documentation

## 2023-03-19 DIAGNOSIS — Z79899 Other long term (current) drug therapy: Secondary | ICD-10-CM | POA: Diagnosis not present

## 2023-03-19 LAB — CBC WITH DIFFERENTIAL/PLATELET
Abs Immature Granulocytes: 0 10*3/uL (ref 0.00–0.07)
Basophils Absolute: 0 10*3/uL (ref 0.0–0.1)
Basophils Relative: 0 %
Eosinophils Absolute: 0 10*3/uL (ref 0.0–0.5)
Eosinophils Relative: 1 %
HCT: 41.8 % (ref 36.0–46.0)
Hemoglobin: 13.2 g/dL (ref 12.0–15.0)
Immature Granulocytes: 0 %
Lymphocytes Relative: 62 %
Lymphs Abs: 2.1 10*3/uL (ref 0.7–4.0)
MCH: 25.8 pg — ABNORMAL LOW (ref 26.0–34.0)
MCHC: 31.6 g/dL (ref 30.0–36.0)
MCV: 81.8 fL (ref 80.0–100.0)
Monocytes Absolute: 0.2 10*3/uL (ref 0.1–1.0)
Monocytes Relative: 7 %
Neutro Abs: 1 10*3/uL — ABNORMAL LOW (ref 1.7–7.7)
Neutrophils Relative %: 30 %
Platelets: 215 10*3/uL (ref 150–400)
RBC: 5.11 MIL/uL (ref 3.87–5.11)
RDW: 13.2 % (ref 11.5–15.5)
WBC: 3.4 10*3/uL — ABNORMAL LOW (ref 4.0–10.5)
nRBC: 0 % (ref 0.0–0.2)

## 2023-03-19 LAB — URINALYSIS, ROUTINE W REFLEX MICROSCOPIC
Bilirubin Urine: NEGATIVE
Glucose, UA: NEGATIVE mg/dL
Hgb urine dipstick: NEGATIVE
Ketones, ur: NEGATIVE mg/dL
Leukocytes,Ua: NEGATIVE
Nitrite: NEGATIVE
Protein, ur: NEGATIVE mg/dL
Specific Gravity, Urine: 1.006 (ref 1.005–1.030)
pH: 6 (ref 5.0–8.0)

## 2023-03-19 LAB — COMPREHENSIVE METABOLIC PANEL
ALT: 14 U/L (ref 0–44)
AST: 17 U/L (ref 15–41)
Albumin: 3.7 g/dL (ref 3.5–5.0)
Alkaline Phosphatase: 55 U/L (ref 38–126)
Anion gap: 6 (ref 5–15)
BUN: 9 mg/dL (ref 6–20)
CO2: 20 mmol/L — ABNORMAL LOW (ref 22–32)
Calcium: 8.7 mg/dL — ABNORMAL LOW (ref 8.9–10.3)
Chloride: 110 mmol/L (ref 98–111)
Creatinine, Ser: 0.52 mg/dL (ref 0.44–1.00)
GFR, Estimated: >60 mL/min (ref >60)
Glucose, Bld: 92 mg/dL (ref 70–99)
Potassium: 3.3 mmol/L — ABNORMAL LOW (ref 3.5–5.1)
Sodium: 136 mmol/L (ref 135–145)
Total Bilirubin: 0.8 mg/dL (ref <1.2)
Total Protein: 7.3 g/dL (ref 6.5–8.1)

## 2023-03-19 LAB — HCG, SERUM, QUALITATIVE: Preg, Serum: NEGATIVE

## 2023-03-19 LAB — MAGNESIUM: Magnesium: 2.1 mg/dL (ref 1.7–2.4)

## 2023-03-19 MED ORDER — SODIUM CHLORIDE 0.9 % IV BOLUS
1000.0000 mL | Freq: Once | INTRAVENOUS | Status: AC
  Administered 2023-03-19: 1000 mL via INTRAVENOUS

## 2023-03-19 MED ORDER — LEVETIRACETAM 500 MG PO TABS: 500.0000 mg | ORAL_TABLET | Freq: Two times a day (BID) | ORAL | 1 refills | Status: DC

## 2023-03-19 MED ORDER — SERTRALINE HCL 50 MG PO TABS: 50.0000 mg | ORAL_TABLET | Freq: Every day | ORAL | 0 refills | Status: AC

## 2023-03-19 NOTE — ED Provider Notes (Signed)
[place] EMERGENCY DEPARTMENT AT [facility] Provider Note   CSN: [id number] Arrival date & time: 03/19/23  1239     History  Chief Complaint  Patient presents with   Seizures    Stephanie Mack is a 32 y.o. female.  Pt is a 32 yo female with pmhx significant for seizures, depression, and bipolar d/o.  Pt had a witnessed seizure by her mom.  Her mom was able to keep her from hitting anything.  Pt said she has been depressed and has not been sleeping well.  No si. She has an appt with neurology on the 30th.  Pt said she has not taken her zoloft in a year and is interested in getting started on it again.  She has been taking her keppra.  She does need another refill of that until she can get to the neurologist.       Home Medications Prior to Admission medications   Medication Sig Start Date End Date Taking? Authorizing Provider  amLODipine (NORVASC) 5 MG tablet Take 1 tablet (5 mg total) by mouth at bedtime. Patient taking differently: Take 5 mg by mouth daily. 01/11/22  Yes Jerre Simon, MD  gabapentin (NEURONTIN) 100 MG capsule Take 100 mg by mouth 2 (two) times daily as needed (nerve pain). 10/05/22  Yes [provider]  ibuprofen (ADVIL) 800 MG tablet Take 800 mg by mouth 2 (two) times daily as needed for mild pain (pain score 1-3) or moderate pain (pain score 4-6). 10/05/22  Yes [provider]  sertraline (ZOLOFT) 50 MG tablet Take 1 tablet (50 mg total) by mouth daily. 03/19/23  Yes Jacalyn Lefevre, MD  diazepam (VALIUM) 5 MG tablet Take 5 mg by mouth every 6 (six) hours as needed for anxiety. Patient not taking: Reported on 03/19/2023 02/27/23   [provider]  levETIRAcetam (KEPPRA) 500 MG tablet Take 1 tablet (500 mg total) by mouth 2 (two) times daily. 03/19/23   Jacalyn Lefevre, MD  lidocaine (XYLOCAINE) 2 % solution Use as directed 15 mLs in the mouth or throat as needed (sore throat). Patient not taking: Reported on 03/14/2023  12/17/22   Letta Kocher, NP      Allergies    Patient has no known allergies.    Review of Systems   Review of Systems  Neurological:  Positive for seizures.  All other systems reviewed and are negative.   Physical Exam Updated Vital Signs BP (!) 141/88   Pulse (!) 56   Temp 98.8 F (37.1 C) (Oral)   Resp 18   SpO2 99%  Physical Exam Vitals and nursing note reviewed.  Constitutional:      Appearance: Normal appearance.  HENT:     Head: Normocephalic and atraumatic.     Right Ear: External ear normal.     Left Ear: External ear normal.     Nose: Nose normal.     Mouth/Throat:     Mouth: Mucous membranes are moist.     Pharynx: Oropharynx is clear.  Eyes:     Extraocular Movements: Extraocular movements intact.     Conjunctiva/sclera: Conjunctivae normal.     Pupils: Pupils are equal, round, and reactive to light.  Cardiovascular:     Rate and Rhythm: Normal rate and regular rhythm.     Pulses: Normal pulses.     Heart sounds: Normal heart sounds.  Pulmonary:     Effort: Pulmonary effort is normal.     Breath sounds: Normal breath sounds.  Abdominal:     General: Abdomen is flat. Bowel sounds are normal.     Palpations: Abdomen is soft.  Musculoskeletal:        General: Normal range of motion.     Cervical back: Normal range of motion and neck supple.  Skin:    General: Skin is warm.     Capillary Refill: Capillary refill takes less than 2 seconds.  Neurological:     General: No focal deficit present.     Mental Status: She is alert and oriented to person, place, and time.  Psychiatric:        Mood and Affect: Mood normal.        Behavior: Behavior normal.     ED Results / Procedures / Treatments   Labs (all labs ordered are listed, but only abnormal results are displayed) Labs Reviewed  COMPREHENSIVE METABOLIC PANEL - Abnormal; Notable for the following components:      Result Value   Potassium 3.3 (*)    CO2 20 (*)    Calcium 8.7 (*)    All  other components within normal limits  CBC WITH DIFFERENTIAL/PLATELET - Abnormal; Notable for the following components:   WBC 3.4 (*)    MCH 25.8 (*)    Neutro Abs 1.0 (*)    All other components within normal limits  URINALYSIS, ROUTINE W REFLEX MICROSCOPIC - Abnormal; Notable for the following components:   Color, Urine STRAW (*)    All other components within normal limits  HCG, SERUM, QUALITATIVE  MAGNESIUM  CBG MONITORING, ED    EKG None  Radiology No results found.  Procedures Procedures    Medications Ordered in ED Medications  sodium chloride 0.9 % bolus 1,000 mL (1,000 mLs Intravenous New Bag/Given 03/19/23 1348)    ED Course/ Medical Decision Making/ A&P                                 Medical Decision Making Amount and/or Complexity of Data Reviewed Labs: ordered.  Risk Prescription drug management.   This patient presents to the ED for concern of seizure, this involves an extensive number of treatment options, and is a complaint that carries with it a high risk of complications and morbidity.  The differential diagnosis includes breakthrough, med noncompliance, electrolyte abn, infection   Co morbidities that complicate the patient evaluation  seizures, depression, and bipolar d/o   Additional history obtained:  Additional history obtained from epic chart review External records from outside source obtained and reviewed including EMS report/mom   Lab Tests:  I Ordered, and personally interpreted labs.  The pertinent results include:  cbc nl, ua nl, mg nl, cmp nl other than mild hypokalemia at 3.3, mg nl  Cardiac Monitoring:  The patient was maintained on a cardiac monitor.  I personally viewed and interpreted the cardiac monitored which showed an underlying rhythm of: nsr   Medicines ordered and prescription drug management:  I ordered medication including ivfs  for sx  Reevaluation of the patient after these medicines showed that the  patient improved I have reviewed the patients home medicines and have made adjustments as needed   Problem List / ED Course:  Seizure:  likely due to lack of sleep.  Pt is given a refill of her keppra and is told to keep her appt on the 30th Depression:  no si.  She is given a refill for her zoloft.  She is going to contact her counselor and get started back with therapy.   Reevaluation:  After the interventions noted above, I reevaluated the patient and found that they have :improved   Social Determinants of Health:  Lives at home   Dispostion:  After consideration of the diagnostic results and the patients response to treatment, I feel that the patent would benefit from discharge with outpatient f/u.          Final Clinical Impression(s) / ED Diagnoses Final diagnoses:  Seizure [facility])    Rx / DC Orders ED Discharge Orders          Ordered    levETIRAcetam (KEPPRA) 500 MG tablet  2 times daily        03/19/23 1431    sertraline (ZOLOFT) 50 MG tablet  Daily        03/19/23 1431              Jacalyn Lefevre, MD 03/19/23 1447

## 2023-03-19 NOTE — ED Triage Notes (Signed)
PT BIBA for home for witnessed seizure lasting approximately 2 minutes.  Pt Mom denies hitting head.  Endorses hx of seizures, per mom compliant with meds, does not have neurologist, PCP is adjusting meds but pt mom states pt  is experiencing an increasing seizure activity since October.  Pt is A&O x 4 and in no obvious distress at this time

## 2023-04-03 ENCOUNTER — Ambulatory Visit: Payer: MEDICAID | Admitting: Neurology

## 2023-04-03 ENCOUNTER — Encounter: Payer: Self-pay | Admitting: Neurology

## 2023-04-03 VITALS — BP 145/89 | HR 90 | Ht 63.0 in | Wt 145.6 lb

## 2023-04-03 DIAGNOSIS — R569 Unspecified convulsions: Secondary | ICD-10-CM | POA: Diagnosis not present

## 2023-04-03 MED ORDER — LEVETIRACETAM 500 MG PO TABS: 500.0000 mg | ORAL_TABLET | Freq: Two times a day (BID) | ORAL | 6 refills | Status: DC

## 2023-04-03 NOTE — Patient Instructions (Signed)
Good to meet you.  Schedule brain MRI with and without contrast  2. Schedule 1-hour EEG. If normal, we will plan for 2-day home EEG  3. Continue Levetiracetam (Keppra) 500mg  twice a day  4. Please follow-up with [facility] for depression  5. Follow-up in 3 months or earlier if needed, call for any changes   Seizure Precautions: 1. If medication has been prescribed for you to prevent seizures, take it exactly as directed.  Do not stop taking the medicine without talking to your doctor first, even if you have not had a seizure in a long time.   2. Avoid activities in which a seizure would cause danger to yourself or to others.  Don't operate dangerous machinery, swim alone, or climb in high or dangerous places, such as on ladders, roofs, or girders.  Do not drive unless your doctor says you may.  3. If you have any warning that you may have a seizure, lay down in a safe place where you can't hurt yourself.    4.  No driving for 6 months from last seizure, as per [facility].   Please refer to the following link on the Epile[employer] of America's website for more information: http://www.epilepsyfoundation.org/answerplace/Social/driving/drivingu.cfm   5.  Maintain good sleep hygiene. Avoid alcohol.  6.  Contact your doctor if you have any problems that may be related to the medicine you are taking.  7.  Call 911 and bring the patient back to the ED if:        A.  The seizure lasts longer than 5 minutes.       B.  The patient doesn't awaken shortly after the seizure  C.  The patient has new problems such as difficulty seeing, speaking or moving  D.  The patient was injured during the seizure  E.  The patient has a temperature over 102 F (39C)  F.  The patient vomited and now is having trouble breathing

## 2023-04-03 NOTE — Progress Notes (Signed)
NEUROLOGY CONSULTATION NOTE  Jennine Cicchino MRN: [id number] DOB: 1990-12-27  Referring provider: Dr. Fleet Contras Primary care provider: [facility]  Reason for consult:  seizures  Dear Dr Concepcion Elk:  Thank you for your kind referral of Suzanne Odoherty for consultation of the above symptoms. Although her history is well known to you, please allow me to reiterate it for the purpose of our medical record. The patient was accompanied to the clinic by her grandmother who also provides collateral information. Records and images were personally reviewed where available.   HISTORY OF PRESENT ILLNESS: This is a 32 year old right-handed woman with a history of hypertension,bipolar disorder, depression, presenting for evaluation of seizures. They report she had a febrile seizure at age 32 and was not on seizure medication, seizure-free until a year ago when she started having recurrent episodes where her hands start sweating and tingling, her mouth and tongue get dry. She gets nauseated then wakes up on the floor. Her grandmother reports her eyes start to flutter, she stops talking then leans over/slumps down with drooling, shaking/jerking of her lips and hands, unresponsive for 5-6 minutes. She opens her eyes after and cannot talk, cold rags of her face really help. She tastes blood after an episode. No tongue bite, incontinence, focal weakness. They note that a lot of stress brings it on. She reports gaps in time, her grandmother notes staring episodes, "she gets real confused. " She reports jerking in her hands occurring separately. She has been to the ER several times since 10/15 for these episodes. She had a witnessed episode on 10/15 where she became unresponsive in the chair, with drooling and eyelid fluttering for 1-2 minutes. She was started on Levetiracetam 500mg  BID and reports last episode was 03/19/23. She has some drowsiness and possibly worsening depression. She reports taking Gabapentin  for the past 6 months for bone pain. She is on Diazepam prn for anxiety, taking it around once a week. Bloodwork in the ER was normal, UDS positive for benzodiazepines and THC. I personally reviewed head CT without contrast which did not show any changes.  She has frequent headaches occurring around once a week, usually when she gets nervous, sick, or when BP is high. She has a pressure in the frontal region with nausea, sensitivity to lights and sounds. Her mother and maternal grandmother have migraines. She denies any dizziness, diplopia, dysarthria/dysphagia, neck pain, bladder dysfunction. She has occasional back pain and a lot of constipation. Mood is depressed, her grandmother reports she gets depressed easily (even before the episodes started), however this has worsened with recent events. She used to be on Sertraline prescribed by [facility] but this was restarted in the ER. They report this makes her shake. She has been scared at night and sleeps during the day, getting 3-4 hours of sleep. No alcohol. She lives with her grandmother, mother, and 2 children (ages 1 and 110). No pregnancy plans, she is s/p tubal ligation. She does not drive.   Her paternal aunt has seizures. She had a febrile seizure at age 32. She had a normal birth and early development.  There is no history of CNS infections such as meningitis/encephalitis, significant traumatic brain injury, neurosurgical procedures.   PAST MEDICAL HISTORY: Past Medical History:  Diagnosis Date   ADHD (attention deficit hyperactivity disorder)    Anemia    Anxiety    Bipolar 1 disorder (HCC)    Chlamydia 05/31/2010   Chronic constipation    Complication of anesthesia  Depression    depression   Dysrhythmia    Fibroid    GERD (gastroesophageal reflux disease)    with pregnancy   Gestational diabetes    current pregnancy   Gonorrhea contact, treated    Headache    Hypertension    Infection    UTI   PONV (postoperative nausea and  vomiting)    Pregnancy induced hypertension    previous pregnancy   Pseudocyesis 2013   Seen in [facility] for percieved FM, abd distension. Normal exam.    Sickle cell trait (HCC)    Type 2 diabetes mellitus (HCC) 02/13/2017    PAST SURGICAL HISTORY: Past Surgical History:  Procedure Laterality Date   CESAREAN SECTION N/A 09/01/2012   Procedure:  Primary cesarean section with delivery of baby girl at 44.  Apgars 1/1.  ;  Surgeon: Kathreen Cosier, MD;  Location: [facility];  Service: Obstetrics;  Laterality: N/A;   CESAREAN SECTION N/A 01/07/2015   Procedure: REPEAT CESAREAN SECTION;  Surgeon: Brock Bad, MD;  Location: [facility];  Service: Obstetrics;  Laterality: N/A;   CESAREAN SECTION WITH BILATERAL TUBAL LIGATION N/A 09/21/2021   Procedure: CESAREAN SECTION WITH BILATERAL TUBAL LIGATION;  Surgeon: Essie Hart, MD;  Location: [facility];  Service: Obstetrics;  Laterality: N/A;   DILATION AND CURETTAGE OF UTERUS     NASAL SEPTUM SURGERY     UMBILICAL HERNIA REPAIR N/A 11/21/2022   Procedure: HERNIA REPAIR UMBILICAL ADULT;  Surgeon: Quentin Ore, MD;  Location: [facility];  Service: General;  Laterality: N/A;   WISDOM TOOTH EXTRACTION      MEDICATIONS: Current Outpatient Medications on File Prior to Visit  Medication Sig Dispense Refill   amLODipine (NORVASC) 5 MG tablet Take 1 tablet (5 mg total) by mouth at bedtime. (Patient taking differently: Take 5 mg by mouth daily.) 90 tablet 3   diazepam (VALIUM) 5 MG tablet Take 5 mg by mouth every 6 (six) hours as needed for anxiety.     gabapentin (NEURONTIN) 100 MG capsule Take 100 mg by mouth 2 (two) times daily as needed (nerve pain).     ibuprofen (ADVIL) 800 MG tablet Take 800 mg by mouth 2 (two) times daily as needed for mild pain (pain score 1-3) or moderate pain (pain score 4-6).     levETIRAcetam (KEPPRA) 500 MG tablet Take 1 tablet (500 mg total) by mouth 2 (two) times daily. 60 tablet 1   sertraline (ZOLOFT) 50 MG tablet Take 1  tablet (50 mg total) by mouth daily. (Patient not taking: Reported on 04/03/2023) 30 tablet 0   No current facility-administered medications on file prior to visit.    ALLERGIES: No Known Allergies  FAMILY HISTORY: Family History  Problem Relation Age of Onset   Kidney disease Mother    Lupus Mother    Vision loss Father    Hypertension Father    Cancer Sister    Asthma Brother    Diabetes Maternal Grandfather    Heart disease Neg Hx     SOCIAL HISTORY: Social History   Socioeconomic History   Marital status: Single    Spouse name: Not on file   Number of children: Not on file   Years of education: Not on file   Highest education level: 12th grade  Occupational History   Not on file  Tobacco Use   Smoking status: Never    Passive exposure: Never   Smokeless tobacco: Never  Vaping Use   Vaping status:  Never Used  Substance and Sexual Activity   Alcohol use: No    Alcohol/week: 0.0 standard drinks of alcohol   Drug use: No   Sexual activity: Not Currently    Partners: Male    Birth control/protection: None  Other Topics Concern   Not on file  Social History Narrative   Single, one daughter born 2016   Are you right handed or left handed? Right    Are you currently employed ? No    What is your current occupation? NA   Do you live at home alone? no   Who lives with you? Family    What type of home do you live in: 1 story or 2 story?  2 story with steps              Social Drivers of Health   Financial Resource Strain: Low Risk  (08/07/2022)   Overall Financial Resource Strain (CARDIA)    Difficulty of Paying Living Expenses: Not hard at all  Food Insecurity: No Food Insecurity (08/07/2022)   Hunger Vital Sign    Worried About Running Out of Food in the Last Year: Never true    Ran Out of Food in the Last Year: Never true  Transportation Needs: No Transportation Needs (08/07/2022)   PRAPARE - Administrator, Civil Service (Medical): No    Lack  of Transportation (Non-Medical): No  Physical Activity: Insufficiently Active (08/07/2022)   Exercise Vital Sign    Days of Exercise per Week: 2 days    Minutes of Exercise per Session: 20 min  Stress: No Stress Concern Present (08/07/2022)   [employer] of Occupational Health - Occupational Stress Questionnaire    Feeling of Stress : Not at all  Social Connections: Unknown (08/07/2022)   Social Connection and Isolation Panel [NHANES]    Frequency of Communication with Friends and Family: More than three times a week    Frequency of Social Gatherings with Friends and Family: Never    Attends Religious Services: 1 to 4 times per year    Active Member of [employer] or Organizations: No    Attends Banker Meetings: Not on file    Marital Status: Patient declined  Intimate Partner Violence: Not At Risk (11/28/2017)   Humiliation, Afraid, Rape, and Kick questionnaire    Fear of Current or Ex-Partner: No    Emotionally Abused: No    Physically Abused: No    Sexually Abused: No     PHYSICAL EXAM: Vitals:   04/03/23 0840 04/03/23 0850  BP: (!) 149/96 (!) 145/89  Pulse: 90   SpO2: 98%    General: No acute distress, depressed Head:  Normocephalic/atraumatic Skin/Extremities: No rash, no edema Neurological Exam: Mental status: alert and oriented to person, place, and time, no dysarthria or aphasia, Fund of knowledge is appropriate.  Recent and remote memory are intact, 3/3 delayed recall.  Attention and concentration are normal, 5/5 WORLD backwards.  Cranial nerves: CN I: not tested CN II: pupils equal, round, visual fields intact CN III, IV, VI:  full range of motion, no nystagmus, no ptosis CN V: decreased cold on right V1-2, decreased pin left V1-2 CN VII: upper and lower face symmetric CN VIII: hearing intact to conversation Bulk & Tone: normal, no fasciculations. Motor: 5/5 throughout with no pronator drift. Sensation: patchy sensory changes with decreased pin on left  hand, decreased pin on right foot, decreased cold on left foot. Intact vibration sense. Romberg test negative Deep  Tendon Reflexes: +2 throughout Cerebellar: no incoordination on finger to nose teting Gait: slow and cautious reporting back pain, no ataxia Tremor: none   IMPRESSION: This is a 32 year old right-handed woman with a history of hypertension,bipolar disorder, depression, presenting for evaluation of seizures. They report decreased responsiveness, drooling, jerking/shaking that are triggered by a lot of stress. Last event was 03/19/23.  Etiology of seizures unclear, we discussed different causes of seizures, including non-epileptic seizures. MRI brain with and without contrast and 1-hour EEG will be ordered. If normal, we will do a 48-hour ambulatory EEG for characterization. Continue Levetiracetam 500mg  BID for now. She does not drive. No pregnancy plans. Patient was advised to follow-up with [facility] for worsening depression, she denies any suicidal ideation. Follow-up in 3 months or earlier if needed, call for any changes.    Thank you for allowing me to participate in the care of this patient. Please do not hesitate to call for any questions or concerns.   Patrcia Dolly, M.D.  CC: Dr. Concepcion Elk, [facility]

## 2023-04-04 ENCOUNTER — Other Ambulatory Visit: Payer: MEDICAID

## 2023-04-11 ENCOUNTER — Emergency Department (HOSPITAL_COMMUNITY)
Admission: EM | Admit: 2023-04-11 | Discharge: 2023-04-11 | Disposition: A | Discharge status: Home / Self Care | Payer: MEDICAID | Attending: Emergency Medicine | Admitting: Emergency Medicine

## 2023-04-11 ENCOUNTER — Other Ambulatory Visit: Payer: Self-pay

## 2023-04-11 ENCOUNTER — Encounter (HOSPITAL_COMMUNITY): Payer: Self-pay | Admitting: *Deleted

## 2023-04-11 DIAGNOSIS — E119 Type 2 diabetes mellitus without complications: Secondary | ICD-10-CM | POA: Diagnosis not present

## 2023-04-11 DIAGNOSIS — R569 Unspecified convulsions: Secondary | ICD-10-CM | POA: Diagnosis present

## 2023-04-11 DIAGNOSIS — Z79899 Other long term (current) drug therapy: Secondary | ICD-10-CM | POA: Diagnosis not present

## 2023-04-11 DIAGNOSIS — I10 Essential (primary) hypertension: Secondary | ICD-10-CM | POA: Insufficient documentation

## 2023-04-11 LAB — PREGNANCY, URINE: Preg Test, Ur: NEGATIVE

## 2023-04-11 LAB — COMPREHENSIVE METABOLIC PANEL
ALT: 11 U/L (ref 0–44)
AST: 12 U/L — ABNORMAL LOW (ref 15–41)
Albumin: 3.5 g/dL (ref 3.5–5.0)
Alkaline Phosphatase: 57 U/L (ref 38–126)
Anion gap: 8 (ref 5–15)
BUN: <5 mg/dL — ABNORMAL LOW (ref 6–20)
CO2: 22 mmol/L (ref 22–32)
Calcium: 8.9 mg/dL (ref 8.9–10.3)
Chloride: 108 mmol/L (ref 98–111)
Creatinine, Ser: 0.6 mg/dL (ref 0.44–1.00)
GFR, Estimated: >60 mL/min (ref >60)
Glucose, Bld: 90 mg/dL (ref 70–99)
Potassium: 3.3 mmol/L — ABNORMAL LOW (ref 3.5–5.1)
Sodium: 138 mmol/L (ref 135–145)
Total Bilirubin: 0.7 mg/dL (ref 0.0–1.2)
Total Protein: 6.7 g/dL (ref 6.5–8.1)

## 2023-04-11 LAB — CBC WITH DIFFERENTIAL/PLATELET
Abs Immature Granulocytes: 0.01 10*3/uL (ref 0.00–0.07)
Basophils Absolute: 0 10*3/uL (ref 0.0–0.1)
Basophils Relative: 0 %
Eosinophils Absolute: 0 10*3/uL (ref 0.0–0.5)
Eosinophils Relative: 1 %
HCT: 42.1 % (ref 36.0–46.0)
Hemoglobin: 13.1 g/dL (ref 12.0–15.0)
Immature Granulocytes: 0 %
Lymphocytes Relative: 50 %
Lymphs Abs: 3 10*3/uL (ref 0.7–4.0)
MCH: 25.5 pg — ABNORMAL LOW (ref 26.0–34.0)
MCHC: 31.1 g/dL (ref 30.0–36.0)
MCV: 81.9 fL (ref 80.0–100.0)
Monocytes Absolute: 0.4 10*3/uL (ref 0.1–1.0)
Monocytes Relative: 7 %
Neutro Abs: 2.5 10*3/uL (ref 1.7–7.7)
Neutrophils Relative %: 42 %
Platelets: 224 10*3/uL (ref 150–400)
RBC: 5.14 MIL/uL — ABNORMAL HIGH (ref 3.87–5.11)
RDW: 12.9 % (ref 11.5–15.5)
WBC: 6 10*3/uL (ref 4.0–10.5)
nRBC: 0 % (ref 0.0–0.2)

## 2023-04-11 LAB — URINALYSIS, ROUTINE W REFLEX MICROSCOPIC
Bilirubin Urine: NEGATIVE
Glucose, UA: NEGATIVE mg/dL
Hgb urine dipstick: NEGATIVE
Ketones, ur: 5 mg/dL — AB
Leukocytes,Ua: NEGATIVE
Nitrite: NEGATIVE
Protein, ur: NEGATIVE mg/dL
Specific Gravity, Urine: 1.014 (ref 1.005–1.030)
pH: 7 (ref 5.0–8.0)

## 2023-04-11 LAB — LIPASE, BLOOD: Lipase: 36 U/L (ref 11–51)

## 2023-04-11 MED ORDER — ACETAMINOPHEN 500 MG PO TABS
1000.0000 mg | ORAL_TABLET | Freq: Once | ORAL | Status: AC
  Administered 2023-04-11: 1000 mg via ORAL
  Filled 2023-04-11: qty 2

## 2023-04-11 MED ORDER — LEVETIRACETAM IN NACL 1000 MG/100ML IV SOLN: 1000.0000 mg | Freq: Once | INTRAVENOUS | Status: DC

## 2023-04-11 MED ORDER — LEVETIRACETAM 500 MG PO TABS
500.0000 mg | ORAL_TABLET | Freq: Once | ORAL | Status: AC
  Administered 2023-04-11: 500 mg via ORAL
  Filled 2023-04-11: qty 1

## 2023-04-11 NOTE — ED Provider Triage Note (Signed)
 Emergency Medicine Provider Triage Evaluation Note  Stephanie Mack , a 33 y.o. female was evaluated in triage.  Pt complains of 4 witnessed seizures by family earlier this afternoon.  Patient has a history of seizures, she did not take her Keppra  this morning. Endorses urinary incontinence today.  Additionally complains of some abdominal pain that started this morning.  Nauseous without emesis.  Review of Systems  Positive:  Negative:   Physical Exam  BP 139/78 (BP Location: Left Arm)   Pulse 68   Temp 99 F (37.2 C) (Oral)   Resp 16   SpO2 99%  Gen:   Awake, no distress   Resp:  Normal effort  MSK:   Moves extremities without difficulty  Other:  Neuro no focal deficit, no lateral tongue lac, abdomen with mild right lower quadrant tenderness, no rebound  Medical Decision Making  Medically screening exam initiated at 1:08 PM.  Appropriate orders placed.  Stephanie Mack was informed that the remainder of the evaluation will be completed by another provider, this initial triage assessment does not replace that evaluation, and the importance of remaining in the ED until their evaluation is complete.     Donnajean Lynwood DEL, PA-C 04/11/23 1318

## 2023-04-11 NOTE — ED Provider Notes (Signed)
 [place] EMERGENCY DEPARTMENT AT [facility] Provider Note   CSN: [id number] Arrival date & time: 04/11/23  1242     History  Chief Complaint  Patient presents with   Seizures    Stephanie Mack is a 33 y.o. female.  Patient is a 33 year old female with a history of depression, anxiety, bipolar disease or use, diabetes, hypertension, seizure disorder on Keppra  500 mg twice daily for the last year who per today after being at home and having a seizure.  This was witnessed by family member.  They initially report that she started smacking and rubbing her lips together and then she drooped down and then her whole body started shaking.  She had 4 separate episodes that lasted a total of 8 minutes and resolved spontaneously.  She reports being compliant with her Keppra  but did not take it today.  Reports that she has had some abdominal cramping and breast tenderness consistent with premenstrual type symptoms but denies any nausea or vomiting.  Has not had recent illness.  Her periods are not regular but she has had a tubal ligation.  She denies any cough, congestion or infectious type symptoms.  Denies significant lack of sleep or increased stress.  Last seizure was about a year ago.  No trauma related to the symptoms today.  She did not fall or hit her head.  She did have urinary incontinence.  The history is provided by the patient and a parent.  Seizures      Home Medications Prior to Admission medications   Medication Sig Start Date End Date Taking? Authorizing Provider  amLODipine  (NORVASC ) 5 MG tablet Take 1 tablet (5 mg total) by mouth at bedtime. Patient taking differently: Take 5 mg by mouth daily. 01/11/22   Rosendo Rush, MD  diazepam (VALIUM) 5 MG tablet Take 5 mg by mouth every 6 (six) hours as needed for anxiety. 02/27/23   [provider]  gabapentin  (NEURONTIN ) 100 MG capsule Take 100 mg by mouth 2 (two) times daily as needed (nerve pain). 10/05/22    [provider]  ibuprofen  (ADVIL ) 800 MG tablet Take 800 mg by mouth 2 (two) times daily as needed for mild pain (pain score 1-3) or moderate pain (pain score 4-6). 10/05/22   [provider]  levETIRAcetam  (KEPPRA ) 500 MG tablet Take 1 tablet (500 mg total) by mouth 2 (two) times daily. 04/03/23   Georjean Darice HERO, MD  sertraline  (ZOLOFT ) 50 MG tablet Take 1 tablet (50 mg total) by mouth daily. Patient not taking: Reported on 04/03/2023 03/19/23   Dean Clarity, MD      Allergies    Patient has no known allergies.    Review of Systems   Review of Systems  Neurological:  Positive for seizures.    Physical Exam Updated Vital Signs BP (!) 131/93 (BP Location: Right Arm)   Pulse 68   Temp 98.6 F (37 C) (Oral)   Resp 13   Ht 5' 3 (1.6 m)   Wt 65.8 kg   SpO2 98%   BMI 25.69 kg/m  Physical Exam Vitals and nursing note reviewed.  Constitutional:      General: She is not in acute distress.    Appearance: She is well-developed.  HENT:     Head: Normocephalic and atraumatic.     Comments: No tongue biting Eyes:     Pupils: Pupils are equal, round, and reactive to light.  Cardiovascular:     Rate and Rhythm: Normal rate  and regular rhythm.     Heart sounds: Normal heart sounds. No murmur heard.    No friction rub.  Pulmonary:     Effort: Pulmonary effort is normal.     Breath sounds: Normal breath sounds. No wheezing or rales.  Abdominal:     General: Bowel sounds are normal. There is no distension.     Palpations: Abdomen is soft.     Tenderness: There is no abdominal tenderness. There is no guarding or rebound.  Musculoskeletal:        General: No tenderness. Normal range of motion.     Comments: No edema  Skin:    General: Skin is warm and dry.     Findings: No rash.  Neurological:     Mental Status: She is alert and oriented to person, place, and time.     Cranial Nerves: No cranial nerve deficit.     Sensory: No sensory deficit.     Motor: No  weakness.     Coordination: Coordination normal.     Gait: Gait normal.  Psychiatric:        Behavior: Behavior normal.     ED Results / Procedures / Treatments   Labs (all labs ordered are listed, but only abnormal results are displayed) Labs Reviewed  CBC WITH DIFFERENTIAL/PLATELET - Abnormal; Notable for the following components:      Result Value   RBC 5.14 (*)    MCH 25.5 (*)    All other components within normal limits  COMPREHENSIVE METABOLIC PANEL - Abnormal; Notable for the following components:   Potassium 3.3 (*)    BUN <5 (*)    AST 12 (*)    All other components within normal limits  URINALYSIS, ROUTINE W REFLEX MICROSCOPIC - Abnormal; Notable for the following components:   APPearance HAZY (*)    Ketones, ur 5 (*)    All other components within normal limits  LIPASE, BLOOD  PREGNANCY, URINE    EKG EKG Interpretation Date/Time:  Tuesday April 11 2023 13:54:47 EST Ventricular Rate:  54 PR Interval:  166 QRS Duration:  84 QT Interval:  438 QTC Calculation: 415 R Axis:   66  Text Interpretation: Sinus bradycardia with sinus arrhythmia Nonspecific T wave abnormality No significant change since last tracing When compared with ECG of 19-Mar-2023 13:11, PREVIOUS ECG IS PRESENT Confirmed by Doretha Folks ([id number]) on 04/11/2023 6:38:00 PM  Radiology No results found.  Procedures Procedures    Medications Ordered in ED Medications  acetaminophen  (TYLENOL ) tablet 1,000 mg (has no administration in time range)  levETIRAcetam  (KEPPRA ) tablet 500 mg (500 mg Oral Given 04/11/23 1538)    ED Course/ Medical Decision Making/ A&P                                 Medical Decision Making Amount and/or Complexity of Data Reviewed Labs: ordered. Decision-making details documented in ED Course. ECG/medicine tests: ordered and independent interpretation performed. Decision-making details documented in ED Course.  Risk OTC drugs. Prescription drug  management.   Pt with multiple medical problems and comorbidities and presenting today with a complaint that caries a high risk for morbidity and mortality.  Here today after witnessed seizure at home.  Patient has been here approximately 6 hours and has had no further seizure activity.  No focal findings on exam.  I independently interpreted patient's labs and EKG and CBC within normal limits, CMP with minimal  decreased potassium of 3.3, lipase and pregnancy test are normal.  EKG without acute findings today.  Patient given a dose of her Keppra  but otherwise appears stable for discharge home.  Unclear source of patient's breakthrough seizure today but will have her follow-up with neurology if she has further seizures but will have her continue her Keppra  for the time being.  She has follow up with neuro tomorrow for EEG.  She and her mom are comfortable with this plan.         Final Clinical Impression(s) / ED Diagnoses Final diagnoses:  Seizure [facility])    Rx / DC Orders ED Discharge Orders     None         Doretha Folks, MD 04/11/23 1948

## 2023-04-11 NOTE — ED Triage Notes (Signed)
 Pt arrived via ems after having 4 witnessed seizures by family. Ems reported patient had fluttering eyes, reactive to pain, answers yes/no. Pt has history of seizures and takes Keppra . PT bp 150/90, p-84, 98/RA, CBD 123. PT has history of dm and htn. Pt is arousable to voice, answers questions, MAE x4. Sleepy

## 2023-04-11 NOTE — Discharge Instructions (Addendum)
 Your potassium level was a little bit low today so drink some orange juice, eat some bananas or try some coconut water  to increase your potassium levels.  Follow-up with the EEG tomorrow and continue your Keppra .  You can take your evening dose tonight as usual.

## 2023-04-11 NOTE — ED Notes (Signed)
Pt ambulatory to restroom with independent steady gait °

## 2023-04-11 NOTE — ED Notes (Signed)
 Awaiting patient from lobby.

## 2023-04-12 ENCOUNTER — Ambulatory Visit: Payer: MEDICAID | Admitting: Neurology

## 2023-04-12 DIAGNOSIS — R569 Unspecified convulsions: Secondary | ICD-10-CM

## 2023-04-12 NOTE — Progress Notes (Signed)
 EEG complete - results pending

## 2023-04-12 NOTE — Procedures (Signed)
 ELECTROENCEPHALOGRAM REPORT  Date of Study: 04/12/2023  Patient's Name: Stephanie Mack MRN: [id number] Date of Birth: [birth year]  Referring Provider: Dr. Darice Shivers  Clinical History: This is a 33 year old woman with new onset seizure, reports 3 seizures yesterday. EEG for classification.  CNS active Medications: Keppra , Gabapentin , Sertraline   Technical Summary: A multichannel digital 1-hour EEG recording measured by the international 10-20 system with electrodes applied with paste and impedances below 5000 ohms performed in our laboratory with EKG monitoring in an awake and drowsy patient.  Hyperventilation and photic stimulation were not performed.  The digital EEG was referentially recorded, reformatted, and digitally filtered in a variety of bipolar and referential montages for optimal display.    Description: The patient is awake and drowsy during the recording.  During maximal wakefulness, there is a symmetric, medium voltage 10 Hz posterior dominant rhythm that attenuates with eye opening.  The record is symmetric.  During drowsiness, there is an increase in theta slowing of the background.  Sleep was not captured. There were no epileptiform discharges or electrographic seizures seen.    EKG lead was unremarkable.  Impression: This 1-hour awake and drowsy EEG is normal.    Clinical Correlation: A normal EEG does not exclude a clinical diagnosis of epilepsy.  If further clinical questions remain, prolonged EEG may be helpful.  Clinical correlation is advised.   Darice Shivers, M.D.

## 2023-04-13 ENCOUNTER — Encounter: Payer: Self-pay | Admitting: Neurology

## 2023-04-19 ENCOUNTER — Telehealth: Payer: Self-pay

## 2023-04-19 DIAGNOSIS — R569 Unspecified convulsions: Secondary | ICD-10-CM

## 2023-04-19 NOTE — Telephone Encounter (Signed)
-----   Message from Jhonny Moss sent at 04/18/2023  3:34 PM EST ----- Pls let her know the EEG is normal, we discussed doing a 2-day home EEG if the office EEG is normal. Pls order if she would like to proceed. Also, go ahead with MRI as scheduled. I see her scheduled to see [facility] in Feb, she does not need to see 2 neurologists, unless she would like to follow-up there instead? Thanks

## 2023-04-19 NOTE — Telephone Encounter (Signed)
 Pt called informed that EEG is normal, we discussed doing a 2-day home EEG if the office EEG is normal. Order placed for 2 day EEG  Also, go ahead with MRI as scheduled. Pt advised we  seen her scheduled to see [facility] in Feb, she does not need to see 2 neurologists, unless she would like to follow-up there instead? Pt wants to stay here at [facility]

## 2023-04-21 ENCOUNTER — Telehealth: Payer: Self-pay | Admitting: Neurology

## 2023-04-21 NOTE — Telephone Encounter (Signed)
Pt states Appointment not needed

## 2023-05-02 ENCOUNTER — Encounter: Payer: Self-pay | Admitting: Neurology

## 2023-05-07 ENCOUNTER — Other Ambulatory Visit: Payer: Self-pay

## 2023-05-07 ENCOUNTER — Other Ambulatory Visit: Payer: MEDICAID

## 2023-05-07 ENCOUNTER — Emergency Department (HOSPITAL_COMMUNITY): Payer: MEDICAID

## 2023-05-07 ENCOUNTER — Emergency Department (HOSPITAL_COMMUNITY)
Admission: EM | Admit: 2023-05-07 | Discharge: 2023-05-07 | Disposition: A | Discharge status: Home / Self Care | Payer: MEDICAID | Attending: Emergency Medicine | Admitting: Emergency Medicine

## 2023-05-07 DIAGNOSIS — R569 Unspecified convulsions: Secondary | ICD-10-CM | POA: Insufficient documentation

## 2023-05-07 DIAGNOSIS — I10 Essential (primary) hypertension: Secondary | ICD-10-CM | POA: Insufficient documentation

## 2023-05-07 DIAGNOSIS — E1165 Type 2 diabetes mellitus with hyperglycemia: Secondary | ICD-10-CM | POA: Diagnosis not present

## 2023-05-07 DIAGNOSIS — Z79899 Other long term (current) drug therapy: Secondary | ICD-10-CM | POA: Diagnosis not present

## 2023-05-07 LAB — CBC WITH DIFFERENTIAL/PLATELET
Abs Immature Granulocytes: 0.02 10*3/uL (ref 0.00–0.07)
Basophils Absolute: 0 10*3/uL (ref 0.0–0.1)
Basophils Relative: 0 %
Eosinophils Absolute: 0 10*3/uL (ref 0.0–0.5)
Eosinophils Relative: 0 %
HCT: 43.1 % (ref 36.0–46.0)
Hemoglobin: 13.6 g/dL (ref 12.0–15.0)
Immature Granulocytes: 0 %
Lymphocytes Relative: 34 %
Lymphs Abs: 2.1 10*3/uL (ref 0.7–4.0)
MCH: 26 pg (ref 26.0–34.0)
MCHC: 31.6 g/dL (ref 30.0–36.0)
MCV: 82.4 fL (ref 80.0–100.0)
Monocytes Absolute: 0.5 10*3/uL (ref 0.1–1.0)
Monocytes Relative: 9 %
Neutro Abs: 3.5 10*3/uL (ref 1.7–7.7)
Neutrophils Relative %: 57 %
Platelets: 229 10*3/uL (ref 150–400)
RBC: 5.23 MIL/uL — ABNORMAL HIGH (ref 3.87–5.11)
RDW: 13.5 % (ref 11.5–15.5)
WBC: 6.2 10*3/uL (ref 4.0–10.5)
nRBC: 0 % (ref 0.0–0.2)

## 2023-05-07 LAB — BASIC METABOLIC PANEL
Anion gap: 7 (ref 5–15)
BUN: 10 mg/dL (ref 6–20)
CO2: 22 mmol/L (ref 22–32)
Calcium: 8.9 mg/dL (ref 8.9–10.3)
Chloride: 105 mmol/L (ref 98–111)
Creatinine, Ser: 0.35 mg/dL — ABNORMAL LOW (ref 0.44–1.00)
GFR, Estimated: >60 mL/min (ref >60)
Glucose, Bld: 106 mg/dL — ABNORMAL HIGH (ref 70–99)
Potassium: 3.7 mmol/L (ref 3.5–5.1)
Sodium: 134 mmol/L — ABNORMAL LOW (ref 135–145)

## 2023-05-07 LAB — MAGNESIUM: Magnesium: 2.1 mg/dL (ref 1.7–2.4)

## 2023-05-07 LAB — CBG MONITORING, ED: Glucose-Capillary: 101 mg/dL — ABNORMAL HIGH (ref 70–99)

## 2023-05-07 MED ORDER — LORAZEPAM 2 MG/ML IJ SOLN
0.5000 mg | Freq: Once | INTRAMUSCULAR | Status: AC
  Administered 2023-05-07: 0.5 mg via INTRAVENOUS
  Filled 2023-05-07: qty 1

## 2023-05-07 MED ORDER — LEVETIRACETAM 750 MG PO TABS: 750.0000 mg | ORAL_TABLET | Freq: Two times a day (BID) | ORAL | 1 refills | Status: DC

## 2023-05-07 MED ORDER — GADOBUTROL 1 MMOL/ML IV SOLN
6.0000 mL | Freq: Once | INTRAVENOUS | Status: AC | PRN
  Administered 2023-05-07: 6 mL via INTRAVENOUS

## 2023-05-07 MED ORDER — LEVETIRACETAM IN NACL 500 MG/100ML IV SOLN
500.0000 mg | Freq: Once | INTRAVENOUS | Status: AC
  Administered 2023-05-07: 500 mg via INTRAVENOUS
  Filled 2023-05-07: qty 100

## 2023-05-07 NOTE — ED Notes (Signed)
 Patient transported to MRI

## 2023-05-07 NOTE — Discharge Instructions (Addendum)
Please follow-up with the neurologist we have attached here for you today in regards to recent symptoms and ER visit.  Today you were seen for a seizure and your labs and imaging were reassuring.  After speaking with the neurologist they recommend increasing your Keppra to 750 mg twice daily.  Please take these medications as prescribed review the information below. If symptoms change or worsen please return to the ER.  Seizure precautions: Per [facility] statutes, patients with seizures are not allowed to drive until they have been seizure-free for six months and cleared by a physician    Use caution when using heavy equipment or power tools. Avoid working on ladders or at heights. Take showers instead of baths. Ensure the water temperature is not too high on the home water heater. Do not go swimming alone. Do not lock yourself in a room alone (i.e. bathroom). When caring for infants or small children, sit down when holding, feeding, or changing them to minimize risk of injury to the child in the event you have a seizure. Maintain good sleep hygiene. Avoid alcohol.    If patient has another seizure, call 911 and bring them back to the ED if: A.  The seizure lasts longer than 5 minutes.      B.  The patient doesn't wake shortly after the seizure or has new problems such as difficulty seeing, speaking or moving following the seizure C.  The patient was injured during the seizure D.  The patient has a temperature over 102 F (39C) E.  The patient vomited during the seizure and now is having trouble breathing    During the Seizure   - First, ensure adequate ventilation and place patients on the floor on their left side  Loosen clothing around the neck and ensure the airway is patent. If the patient is clenching the teeth, do not force the mouth open with any object as this can cause severe damage - Remove all items from the surrounding that can be hazardous. The patient may be oblivious to  what's happening and may not even know what he or she is doing. If the patient is confused and wandering, either gently guide him/her away and block access to outside areas - Reassure the individual and be comforting - Call 911. In most cases, the seizure ends before EMS arrives. However, there are cases when seizures may last over 3 to 5 minutes. Or the individual may have developed breathing difficulties or severe injuries. If a pregnant patient or a person with diabetes develops a seizure, it is prudent to call an ambulance. - Finally, if the patient does not regain full consciousness, then call EMS. Most patients will remain confused for about 45 to 90 minutes after a seizure, so you must use judgment in calling for help. - Avoid restraints but make sure the patient is in a bed with padded side rails - Place the individual in a lateral position with the neck slightly flexed; this will help the saliva drain from the mouth and prevent the tongue from falling backward - Remove all nearby furniture and other hazards from the area - Provide verbal assurance as the individual is regaining consciousness - Provide the patient with privacy if possible - Call for help and start treatment as ordered by the caregiver    After the Seizure (Postictal Stage)   After a seizure, most patients experience confusion, fatigue, muscle pain and/or a headache. Thus, one should permit the individual to sleep.  For the next few days, reassurance is essential. Being calm and helping reorient the person is also of importance.   Most seizures are painless and end spontaneously. Seizures are not harmful to others but can lead to complications such as stress on the lungs, brain and the heart. Individuals with prior lung problems may develop labored breathing and respiratory distress.

## 2023-05-07 NOTE — ED Provider Notes (Signed)
  Physical Exam  BP (!) 127/92   Pulse 71   Temp 98.1 F (36.7 C) (Oral)   Resp 18   Ht 5\' 2"  (1.575 m)   Wt 64.9 kg   SpO2 100%   BMI 26.16 kg/m   Physical Exam Constitutional:      General: She is not in acute distress.    Appearance: Normal appearance. She is not ill-appearing.  Eyes:     General: No scleral icterus.       Right eye: No discharge.        Left eye: No discharge.  Cardiovascular:     Rate and Rhythm: Normal rate.  Pulmonary:     Effort: No respiratory distress.  Abdominal:     General: Abdomen is flat. There is no distension.  Skin:    Coloration: Skin is not jaundiced or pale.     Findings: No erythema.  Neurological:     Mental Status: She is alert and oriented to person, place, and time. Mental status is at baseline.     Procedures  Procedures  ED Course / MDM   Clinical Course as of 05/07/23 1536  Sun May 07, 2023  1504 Hx of seizures, keppra 500mg  BID. 3 seizures a week, scheduled for appointment today but missed due to seizure today. Talked to neurology, MRI ordered and increase Keppra today. F/u w/ neurology. If no acute findings, can go home.  [CB]    Clinical Course User Index [CB] Lunette Stands, PA-C   Medical Decision Making Amount and/or Complexity of Data Reviewed Labs: ordered. Radiology: ordered.  Risk Prescription drug management.   Patient care was transferred over from Wilfred Lacy, PA-C.   Time of handoff, patient was to undergo MRI and have Keppra dose raised to 750mg  BID.  If labs and imaging continue to come back normal, patient can follow-up with her neurologist outpatient.  Patient already has appointment scheduled February 9.  Patient is a 33 year old female presents the ED today after a seizure.  Patient with a history of seizures, type 2 diabetes, essential hypertension, bipolar 1 disorder, GERD, anemia.  Currently taking 500 mg twice daily.  Patient did not want to provide urine so urine pregnancy and UA  were not done.  Labs were unremarkable.  MRI was negative.  Patient vital signs remained stable to the course of her time here.  I believe patient to be discharged this time and follow-up outpatient with neurology.  Patient and parent both expressed agreement understanding of plan.      Lunette Stands, [place] 05/07/23 1639    Glendora Score, MD 05/08/23 1240

## 2023-05-07 NOTE — ED Provider Notes (Signed)
[facility] EMERGENCY DEPARTMENT AT [facility] Provider Note   CSN: [id number] Arrival date & time: 05/07/23  1154     History  Chief Complaint  Patient presents with   Seizures    Stephanie Mack is a 33 y.o. female history of seizures on Keppra, type 2 diabetes, hypertension, bipolar 1 presented after a seizure that occurred earlier this morning.  Patient was at church when she had left hand twitching and then had full body twitching in which she lost consciousness similar to previous seizures.  Patient states that this occurred for about 4 minutes but does not remember the event and states that she was confused afterwards.  Patient denies biting her tongue or urinary incontinence.  Patient denies hitting her head as she states that bystanders told her they were able to lower her to the ground.  Patient does see neurology and has had normal EEG in the past and has an MRI coming up.  Patient denies any tremors, chest pain, shortness of breath.  Patient does state that she feels that she may have a UTI as she does have dysuria and states she does not sleep very well which both Could be contributing to her seizures.  Home Medications Prior to Admission medications   Medication Sig Start Date End Date Taking? Authorizing Provider  amLODipine (NORVASC) 5 MG tablet Take 1 tablet (5 mg total) by mouth at bedtime. Patient taking differently: Take 5 mg by mouth daily. 01/11/22   Jerre Simon, MD  diazepam (VALIUM) 5 MG tablet Take 5 mg by mouth every 6 (six) hours as needed for anxiety. 02/27/23   [provider]  gabapentin (NEURONTIN) 100 MG capsule Take 100 mg by mouth 2 (two) times daily as needed (nerve pain). 10/05/22   [provider]  ibuprofen (ADVIL) 800 MG tablet Take 800 mg by mouth 2 (two) times daily as needed for mild pain (pain score 1-3) or moderate pain (pain score 4-6). 10/05/22   [provider]  levETIRAcetam (KEPPRA) 750 MG tablet Take 1 tablet  (750 mg total) by mouth 2 (two) times daily. 05/07/23   Netta Corrigan, PA-C  sertraline (ZOLOFT) 50 MG tablet Take 1 tablet (50 mg total) by mouth daily. Patient not taking: Reported on 04/03/2023 03/19/23   Jacalyn Lefevre, MD      Allergies    Patient has no known allergies.    Review of Systems   Review of Systems  Neurological:  Positive for seizures.    Physical Exam Updated Vital Signs BP 127/78 (BP Location: Right Arm)   Pulse 65   Temp 98.1 F (36.7 C) (Oral)   Resp 19   Ht 5\' 2"  (1.575 m)   Wt 64.9 kg   SpO2 100%   BMI 26.16 kg/m  Physical Exam Vitals reviewed.  Constitutional:      General: She is not in acute distress. HENT:     Head: Normocephalic and atraumatic.  Eyes:     Extraocular Movements: Extraocular movements intact.     Conjunctiva/sclera: Conjunctivae normal.     Pupils: Pupils are equal, round, and reactive to light.  Cardiovascular:     Rate and Rhythm: Normal rate and regular rhythm.     Pulses: Normal pulses.     Heart sounds: Normal heart sounds.     Comments: 2+ bilateral radial/dorsalis pedis pulses with regular rate Pulmonary:     Effort: Pulmonary effort is normal. No respiratory distress.     Breath sounds: Normal  breath sounds.  Abdominal:     Palpations: Abdomen is soft.     Tenderness: There is no abdominal tenderness. There is no guarding or rebound.  Musculoskeletal:        General: Normal range of motion.     Cervical back: Normal range of motion and neck supple.     Comments: 5 out of 5 bilateral grip/leg extension strength  Skin:    General: Skin is warm and dry.     Capillary Refill: Capillary refill takes less than 2 seconds.  Neurological:     General: No focal deficit present.     Mental Status: She is alert and oriented to person, place, and time.     Sensory: Sensation is intact.     Motor: Motor function is intact.     Coordination: Coordination is intact.     Comments: Sensation intact in all 4 limbs Cranial  nerves II through XII intact No signs of tremors or status epilepticus  Psychiatric:        Mood and Affect: Mood normal.     ED Results / Procedures / Treatments   Labs (all labs ordered are listed, but only abnormal results are displayed) Labs Reviewed  BASIC METABOLIC PANEL - Abnormal; Notable for the following components:      Result Value   Sodium 134 (*)    Glucose, Bld 106 (*)    Creatinine, Ser 0.35 (*)    All other components within normal limits  CBC WITH DIFFERENTIAL/PLATELET - Abnormal; Notable for the following components:   RBC 5.23 (*)    All other components within normal limits  CBG MONITORING, ED - Abnormal; Notable for the following components:   Glucose-Capillary 101 (*)    All other components within normal limits  MAGNESIUM  CBC WITH DIFFERENTIAL/PLATELET  URINALYSIS, ROUTINE W REFLEX MICROSCOPIC  PREGNANCY, URINE  LEVETIRACETAM LEVEL    EKG EKG Interpretation Date/Time:  Sunday May 07 2023 12:05:50 EST Ventricular Rate:  68 PR Interval:  168 QRS Duration:  79 QT Interval:  407 QTC Calculation: 433 R Axis:   68  Text Interpretation: Sinus rhythm Confirmed by Linwood Dibbles [phone]) on 05/07/2023 1:02:48 PM  Radiology No results found.  Procedures Procedures    Medications Ordered in ED Medications  LORazepam (ATIVAN) injection 0.5 mg (has no administration in time range)  gadobutrol (GADAVIST) 1 MMOL/ML injection 6 mL (has no administration in time range)  levETIRAcetam (KEPPRA) IVPB 500 mg/100 mL premix (500 mg Intravenous New Bag/Given 05/07/23 1340)    ED Course/ Medical Decision Making/ A&P Clinical Course as of 05/07/23 1514  Sun May 07, 2023  1504 Hx of seizures, keppra 500mg  BID. 3 seizures a week, scheduled for appointment today but missed due to seizure today. Talked to neurology, MRI ordered and increase Keppra today. F/u w/ neurology. If no acute findings, can go home.  [CB]    Clinical Course User Index [CB] Lunette Stands,  PA-C                                 Medical Decision Making Amount and/or Complexity of Data Reviewed Labs: ordered. Radiology: ordered.  Risk Prescription drug management.   Antionette Poles 32 y.o. presented today for seizure-like activity. Working DDx that I considered at this time includes, but not limited to, seizure, PNES, non-convulsive status epilepticus, status epilepticus, hypoglycemia, electrolyte abnormalities, arrhythmias, sepsis, meningitis, medication non-compliance, drug intoxication/withdrawal, intracranial  tumor/hemorrhage, subdural/epidural hematoma, basilar skull fracture, CVA/TIA.  R/o DDx: Pending  Review of prior external notes: 04/03/2023 office visit  Unique Tests and My Independent Interpretation:  CBC: Unremarkable BMP: Unremarkable EKG: Sinus 60 bpm Magnesium: Unremarkable UA: Pending UPT: Pending CBG: 101 Levetiracetam: Pending MRI brain with and without contrast: Pending  Social Determinants of Health: none  Discussion with Independent Historian:  EMS  Discussion of Management of Tests:  Selina Cooley, MD neurology  Risk: Medium: prescription drug management  Risk Stratification Score: None  Plan: Patient presented after seizure-like activity.  On exam patient was in no acute distress with stable vitals and back to mental baseline. Patient did not have any lateral tongue biting, urinary incontinence, neuro deficit, track marks, rashes. Patient did not endorse new medication changes or illicit drug use. Patient is on keppra for seizures and stated they last took their meds today. Patient's symptoms lasted for 4 minutes. Patient not currently endorsing symptoms and on exam does not appear in status epilepticus. Will proceed with labs and imaging and patient will be monitored. The cardiac monitor was ordered secondary to the patient's history of seizures and to monitor the patient for dysrhythmia. Cardiac monitor by my independent interpretation showed  normal sinus.  Will obtain labs and urine to further evaluate patient.  Will give patient IV Keppra while we wait.  Patient did have outpatient EEG that was ultimately reassuring however has an MRI coming up to further evaluate ongoing seizures.  Patient's labs are thus far reassuring.  I spoke to neurology as patient was to have MRI done today that she cannot make due to having seizure in church and they recommend getting the MRI here and increasing her Keppra to 750 twice daily.  Patient signed out to [place], [place].  Please review their note for the continuation of patient's care.  The plan at this point is follow-up on imaging and labs and if stable can discharge with neurology follow-up.  This chart was dictated using voice recognition software.  Despite best efforts to proofread,  errors can occur which can change the documentation meaning.         Final Clinical Impression(s) / ED Diagnoses Final diagnoses:  Seizure [facility])    Rx / DC Orders ED Discharge Orders          Ordered    levETIRAcetam (KEPPRA) 750 MG tablet  2 times daily        05/07/23 1437              Remi Deter 05/07/23 1514    Linwood Dibbles, MD 05/08/23 [phone]

## 2023-05-07 NOTE — ED Triage Notes (Signed)
Pt BIB EMS and presents after a seizure. Patient was in church when patient had for about 4 minutes. Patient's left arm was convulsing. Patient was unresponsive but did not sustain any falls. On EMS arrival, patient was post-ictal and gradually became a/o x 4. Patient and family reports compliance with meds. Per the family patient usually does not have other seizures if the initial seizure is greater than about 3 minutes. Patient takes Keppra.   Patient has an upcoming Neuro appt on Feb. 9.  EMS VS: 124/88, HR: 72, RR: 20, 99% RA, CBG: 116

## 2023-05-09 LAB — LEVETIRACETAM LEVEL: Levetiracetam Lvl: 15.8 ug/mL (ref 10.0–40.0)

## 2023-05-15 ENCOUNTER — Ambulatory Visit: Payer: MEDICAID | Admitting: Neurology

## 2023-05-15 DIAGNOSIS — R569 Unspecified convulsions: Secondary | ICD-10-CM | POA: Diagnosis not present

## 2023-05-15 NOTE — Progress Notes (Signed)
Ambulatory EEG hooked up and running. Light flashing. Push button tested. Camera and event log explained. Batteries explained. Patient understood.

## 2023-05-17 NOTE — Progress Notes (Signed)
AMB EEG discontinued.  Skin Breakdown:No Diary Returned: Yes

## 2023-05-18 ENCOUNTER — Ambulatory Visit: Payer: MEDICAID | Admitting: Neurology

## 2023-06-05 ENCOUNTER — Encounter: Payer: Self-pay | Admitting: Neurology

## 2023-06-05 IMAGING — US US OB < 14 WEEKS - US OB TV
1 series · 15 of 23 positions shown · non-contrast
Comparison: None.

CLINICAL DATA: Abdominal pain.

EXAM:
OBSTETRIC <14 WK US AND TRANSVAGINAL OB US
TECHNIQUE: Both transabdominal and transvaginal ultrasound examinations were
performed for complete evaluation of the gestation as well as the
maternal uterus, adnexal regions, and pelvic cul-de-sac.
Transvaginal technique was performed to assess early pregnancy.

[Series 1: us ob < 14 weeks - us ob tv · 15 of 23 slices shown]
[im 1/23]
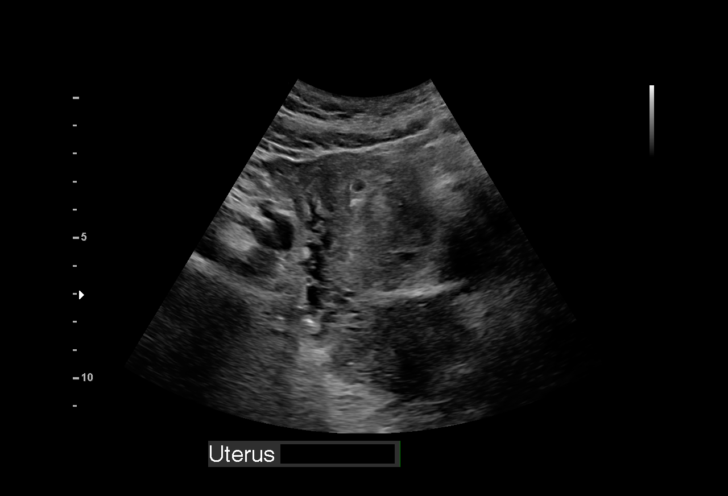
[im 3/23]
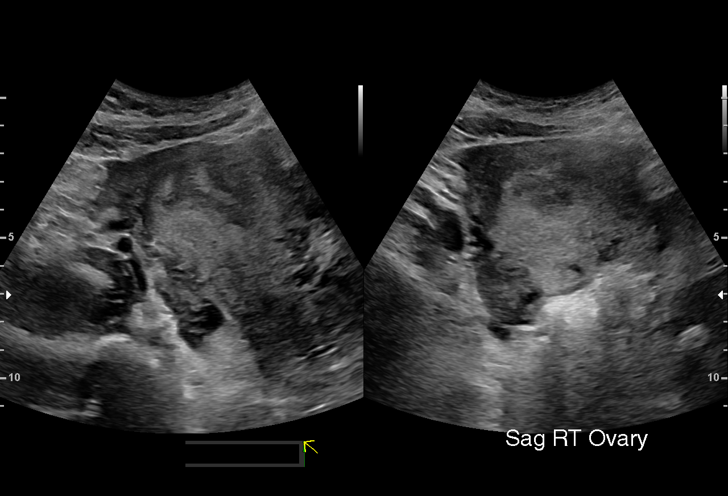
[im 4/23]
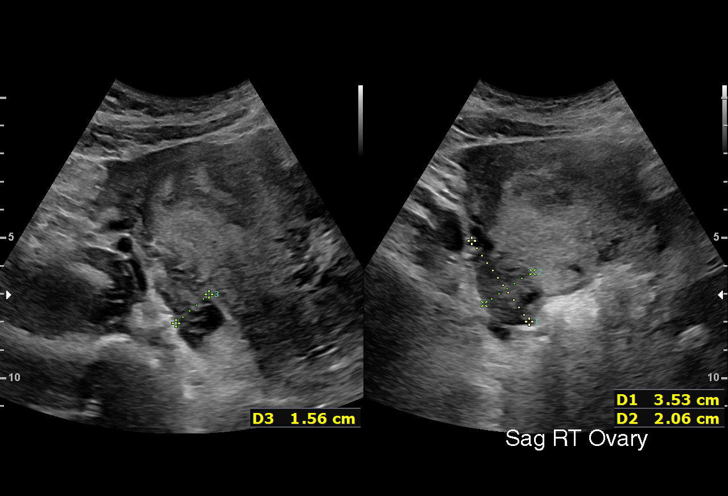
[im 6/23]
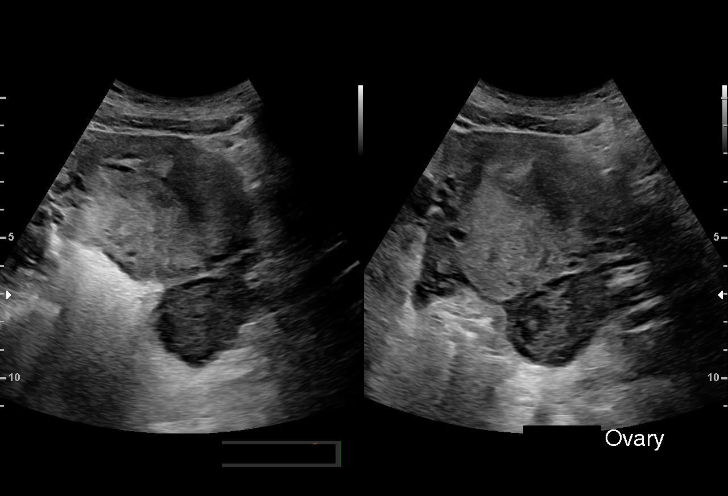
[im 7/23]
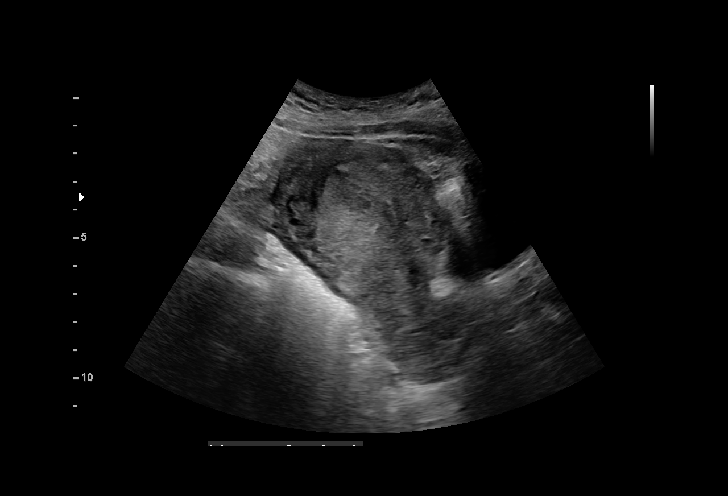
[im 9/23]
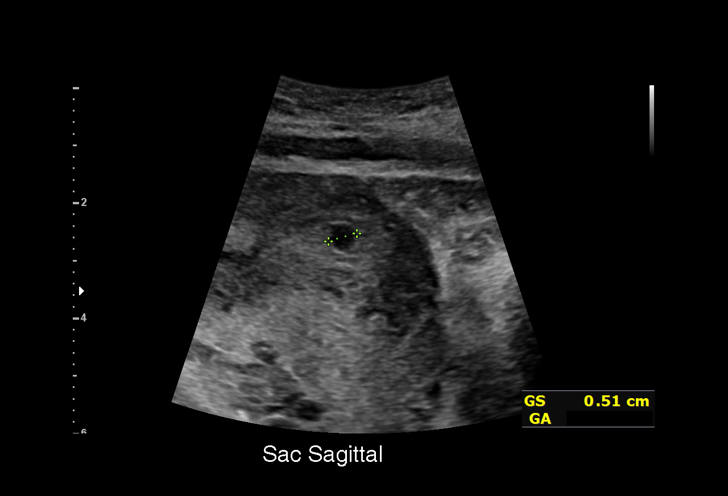
[im 10/23]
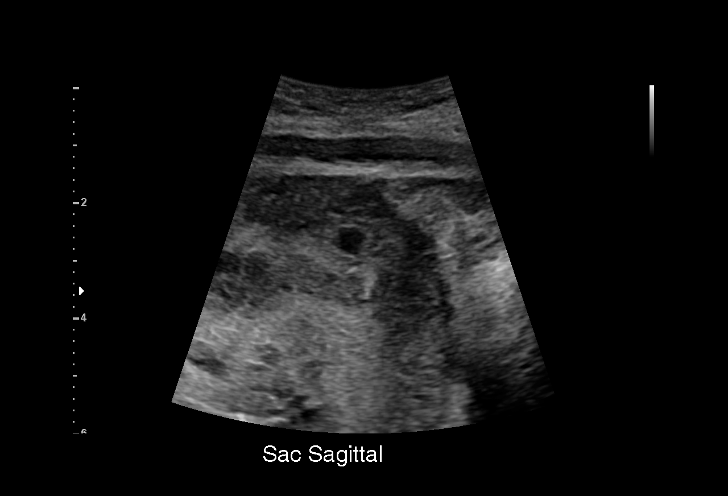
[im 12/23]
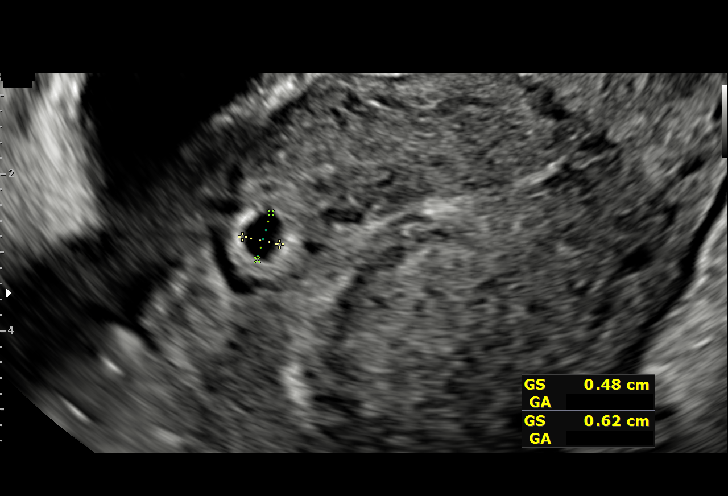
[im 14/23]
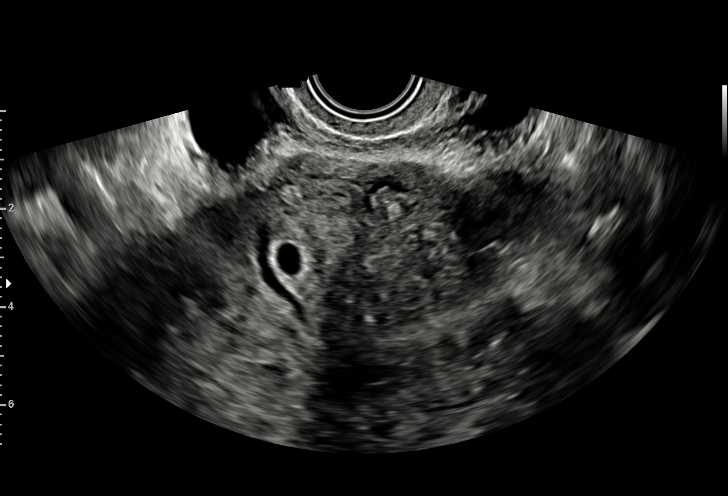
[im 15/23]
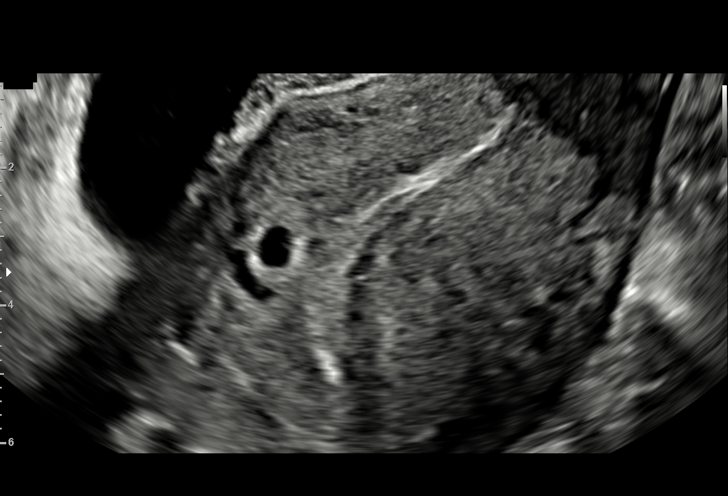
[im 17/23]
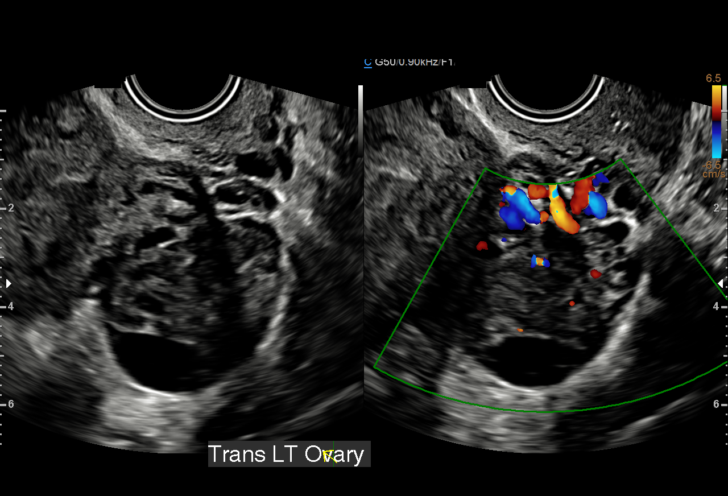
[im 18/23]
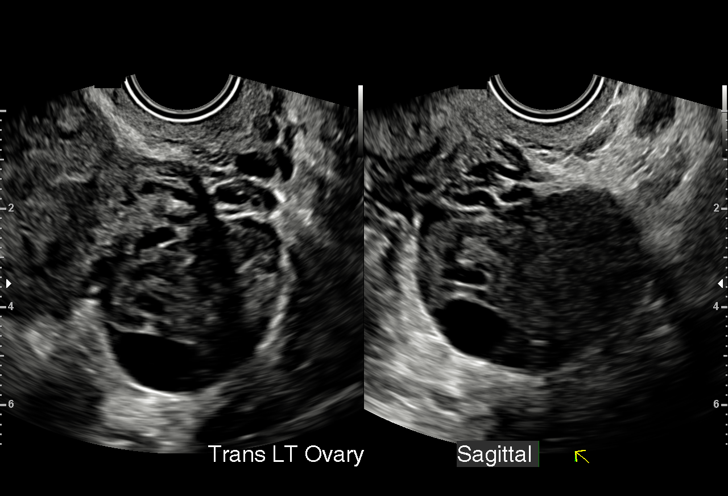
[im 20/23]
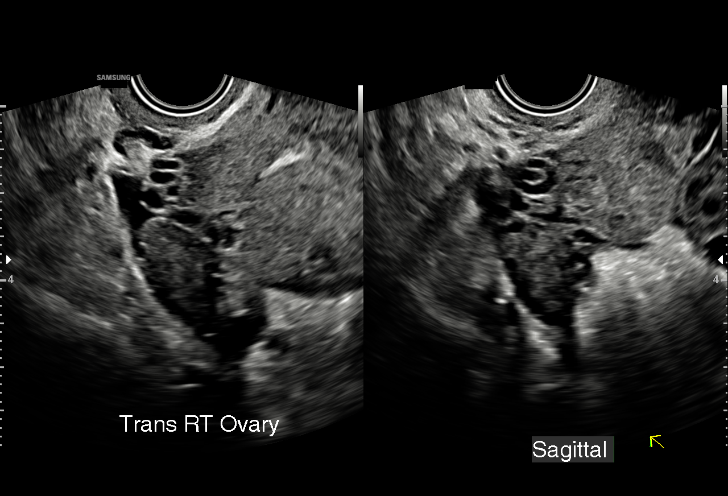
[im 21/23]
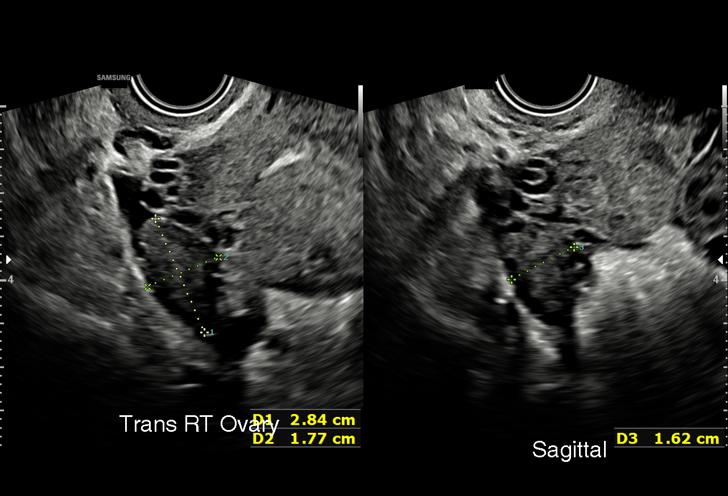
[im 23/23]
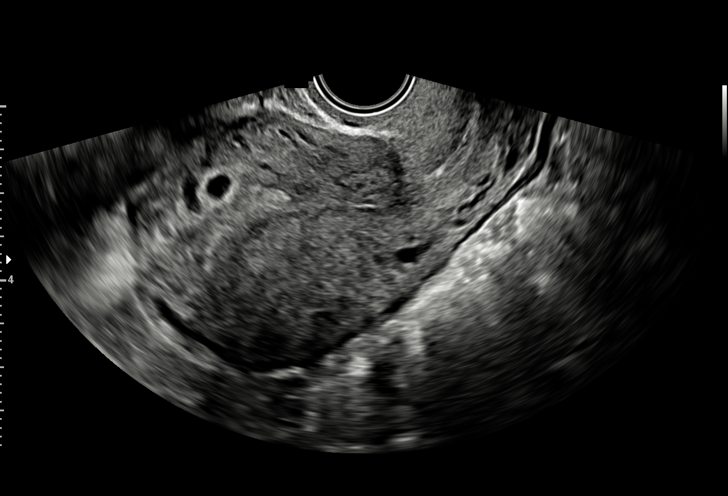

[15 of 23 positions shown; findings below may reference images not displayed]

FINDINGS: Intrauterine gestational sac: Probable small gestational sac.

Yolk sac:  Not Visualized.

Embryo:  Not Visualized.

MSD: 5.3 mm mm   5 w   2 d

CRL:    mm    w    d                  US EDC:

Subchorionic hemorrhage:  Small subchorionic hemorrhage

Maternal uterus/adnexae: Normal.
IMPRESSION: 1. Probable early intrauterine gestational sac, but no yolk sac,
fetal pole, or cardiac activity yet visualized. Recommend follow-up
quantitative B-HCG levels and follow-up US in 14 days to assess
viability. This recommendation follows SRU consensus guidelines:
Diagnostic Criteria for Nonviable Pregnancy Early in the First
Trimester. N Engl J Med 5944; [DATE].
2. Small subchorionic hemorrhage.

## 2023-06-05 NOTE — Procedures (Signed)
 ELECTROENCEPHALOGRAM REPORT  Dates of Recording: 05/15/2023 10:45AM to 05/17/2023 7:39AM  Patient's Name: Stephanie Mack MRN: [id number] Date of Birth: January 21, 1991  Referring Provider: Dr. Patrcia Dolly  Procedure: 41-hour ambulatory EEG  History: This is a 33 year old woman with new onset seizure, reports 3 seizures yesterday. EEG for classification.   CNS active Medications: Keppra, Gabapentin, Sertraline  Technical Summary: This is a 41-hour multichannel digital video recording measured by the international 10-20 system with electrodes applied with paste and impedances below 5000 ohms performed as portable with EKG monitoring.  The digital EEG was referentially recorded, reformatted, and digitally filtered in a variety of bipolar and referential montages for optimal display.    DESCRIPTION OF RECORDING: During maximal wakefulness, the background activity consisted of a symmetric 10.5 Hz posterior dominant rhythm which was reactive to eye opening.  There were no epileptiform discharges or focal slowing seen in wakefulness.  During the recording, the patient progresses through wakefulness, drowsiness, and Stage 2 sleep.  Again, there were no epileptiform discharges seen.  Events: Patient reported shaking in her sleep and head kept bothering her and the right side of body was stiff. No date or time indicated.  There were push button events on 2/10 at 1634, 1803, 1918, 1931, 2105, 2134, 12243, 2330 hours. No video available for review. Electrographically, there were no EEG or EKG changes seen.  There were push button events on 2/11 at 0357, 0646, 0647, 1503, 1535, 1727, 1737, 1833, 2238 hours. No video available for review. Electrographically, there were no EEG or EKG changes seen.  There was one push button event on 2/12 at 0730 hours. No video available for review. Electrographically, there were no EEG or EKG changes seen.  There were no electrographic seizures seen.  EKG lead was  unremarkable.  IMPRESSION: This 41-hour ambulatory video study is normal.    CLINICAL CORRELATION: A normal EEG does not exclude a clinical diagnosis of epilepsy. Patient reported shaking in sleep, head bothering her, right side of body stiff with no EEG changes seen in this study. Push button events did not show any EEG change.  If further clinical questions remain, inpatient video EEG monitoring may be helpful.   Patrcia Dolly, M.D.

## 2023-06-05 NOTE — Addendum Note (Signed)
 Addended by: Van Clines on: 06/05/2023 04:45 PM   Modules accepted: Orders

## 2023-06-08 ENCOUNTER — Encounter: Payer: Self-pay | Admitting: Neurology

## 2023-06-08 ENCOUNTER — Ambulatory Visit: Payer: MEDICAID | Admitting: Neurology

## 2023-06-20 ENCOUNTER — Ambulatory Visit: Payer: MEDICAID | Admitting: Neurology

## 2023-07-30 ENCOUNTER — Emergency Department (HOSPITAL_COMMUNITY)
Admission: EM | Admit: 2023-07-30 | Discharge: 2023-07-30 | Disposition: A | Discharge status: Home / Self Care | Payer: MEDICAID | Attending: Emergency Medicine | Admitting: Emergency Medicine

## 2023-07-30 ENCOUNTER — Encounter (HOSPITAL_COMMUNITY): Payer: Self-pay | Admitting: Emergency Medicine

## 2023-07-30 ENCOUNTER — Other Ambulatory Visit: Payer: Self-pay

## 2023-07-30 DIAGNOSIS — G40909 Epilepsy, unspecified, not intractable, without status epilepticus: Secondary | ICD-10-CM

## 2023-07-30 DIAGNOSIS — Z79899 Other long term (current) drug therapy: Secondary | ICD-10-CM | POA: Diagnosis not present

## 2023-07-30 DIAGNOSIS — R569 Unspecified convulsions: Secondary | ICD-10-CM | POA: Insufficient documentation

## 2023-07-30 DIAGNOSIS — E119 Type 2 diabetes mellitus without complications: Secondary | ICD-10-CM | POA: Insufficient documentation

## 2023-07-30 DIAGNOSIS — I10 Essential (primary) hypertension: Secondary | ICD-10-CM | POA: Insufficient documentation

## 2023-07-30 HISTORY — DX: Unspecified convulsions: R56.9

## 2023-07-30 LAB — CBC WITH DIFFERENTIAL/PLATELET
Abs Immature Granulocytes: 0.01 10*3/uL (ref 0.00–0.07)
Basophils Absolute: 0 10*3/uL (ref 0.0–0.1)
Basophils Relative: 0 %
Eosinophils Absolute: 0 10*3/uL (ref 0.0–0.5)
Eosinophils Relative: 0 %
HCT: 40.6 % (ref 36.0–46.0)
Hemoglobin: 12.6 g/dL (ref 12.0–15.0)
Immature Granulocytes: 0 %
Lymphocytes Relative: 37 %
Lymphs Abs: 2 10*3/uL (ref 0.7–4.0)
MCH: 26.1 pg (ref 26.0–34.0)
MCHC: 31 g/dL (ref 30.0–36.0)
MCV: 84.1 fL (ref 80.0–100.0)
Monocytes Absolute: 0.4 10*3/uL (ref 0.1–1.0)
Monocytes Relative: 7 %
Neutro Abs: 2.9 10*3/uL (ref 1.7–7.7)
Neutrophils Relative %: 56 %
Platelets: 206 10*3/uL (ref 150–400)
RBC: 4.83 MIL/uL (ref 3.87–5.11)
RDW: 12.8 % (ref 11.5–15.5)
WBC: 5.3 10*3/uL (ref 4.0–10.5)
nRBC: 0 % (ref 0.0–0.2)

## 2023-07-30 LAB — RAPID URINE DRUG SCREEN, HOSP PERFORMED
Amphetamines: NOT DETECTED
Barbiturates: NOT DETECTED
Benzodiazepines: POSITIVE — AB
Cocaine: NOT DETECTED
Opiates: NOT DETECTED
Tetrahydrocannabinol: POSITIVE — AB

## 2023-07-30 LAB — BASIC METABOLIC PANEL WITH GFR
Anion gap: 12 (ref 5–15)
BUN: 6 mg/dL (ref 6–20)
CO2: 20 mmol/L — ABNORMAL LOW (ref 22–32)
Calcium: 8.7 mg/dL — ABNORMAL LOW (ref 8.9–10.3)
Chloride: 109 mmol/L (ref 98–111)
Creatinine, Ser: 0.62 mg/dL (ref 0.44–1.00)
GFR, Estimated: >60 mL/min (ref >60)
Glucose, Bld: 82 mg/dL (ref 70–99)
Potassium: 3 mmol/L — ABNORMAL LOW (ref 3.5–5.1)
Sodium: 141 mmol/L (ref 135–145)

## 2023-07-30 LAB — MAGNESIUM: Magnesium: 2.1 mg/dL (ref 1.7–2.4)

## 2023-07-30 LAB — HCG, QUANTITATIVE, PREGNANCY: hCG, Beta Chain, Quant, S: <1 m[IU]/mL (ref <5)

## 2023-07-30 MED ORDER — POTASSIUM CHLORIDE ER 10 MEQ PO TBCR: 10.0000 meq | EXTENDED_RELEASE_TABLET | Freq: Every day | ORAL | 0 refills | Status: AC

## 2023-07-30 MED ORDER — POTASSIUM CHLORIDE 10 MEQ/100ML IV SOLN: 10.0000 meq | Freq: Once | INTRAVENOUS | Status: DC

## 2023-07-30 MED ORDER — POTASSIUM CHLORIDE CRYS ER 20 MEQ PO TBCR
40.0000 meq | EXTENDED_RELEASE_TABLET | Freq: Once | ORAL | Status: AC
  Administered 2023-07-30: 40 meq via ORAL
  Filled 2023-07-30: qty 2

## 2023-07-30 MED ORDER — LEVETIRACETAM 500 MG PO TABS
1000.0000 mg | ORAL_TABLET | Freq: Once | ORAL | Status: AC
  Administered 2023-07-30: 1000 mg via ORAL
  Filled 2023-07-30: qty 2

## 2023-07-30 MED ORDER — LEVETIRACETAM 1000 MG PO TABS: 1000.0000 mg | ORAL_TABLET | Freq: Two times a day (BID) | ORAL | 0 refills | Status: DC
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
## 2023-07-30 NOTE — ED Notes (Signed)
 Pt ambulatory to restroom with steady gait.

## 2023-07-30 NOTE — ED Triage Notes (Signed)
 Pt BIB [facility] from home 5 minute witnessed sz, fixed gazed to right, eye lid twitching, unresponsive. Hx of sz, takes keppra   5mg  midazolam  given by EMS  Vitals WNL slightly hypertensive 126 CBG

## 2023-07-30 NOTE — ED Provider Notes (Signed)
 [place] EMERGENCY DEPARTMENT AT [facility] Provider Note  CSN: [id number] Arrival date & time: 07/30/23 1601  Chief Complaint(s) Seizures  HPI Stephanie Mack is a 33 y.o. female who presented to the emergency department after reported seizure.  Patient reportedly had a seizure Lasted for about 5 minutes.  EMS arrived, the seizure had aborted, however the patient was exhibiting symptoms that they described to me as looking off to the side with a gaze deviation, and per their protocols they treat with additional medications.  Patient received 5 of IM Versed .    Past Medical History Past Medical History:  Diagnosis Date   ADHD (attention deficit hyperactivity disorder)    Anemia    Anxiety    Bipolar 1 disorder (HCC)    Chlamydia 05/31/2010   Chronic constipation    Complication of anesthesia    Depression    depression   Dysrhythmia    Fibroid    GERD (gastroesophageal reflux disease)    with pregnancy   Gestational diabetes    current pregnancy   Gonorrhea contact, treated    Headache    Hypertension    Infection    UTI   PONV (postoperative nausea and vomiting)    Pregnancy induced hypertension    previous pregnancy   Pseudocyesis 2013   Seen in MAU for percieved FM, abd distension. Normal exam.    Seizures (HCC)    Sickle cell trait (HCC)    Type 2 diabetes mellitus (HCC) 02/13/2017   Patient Active Problem List   Diagnosis Date Noted   Umbilical hernia without obstruction and without gangrene 10/02/2022   Mood changes 09/12/2022   Syncope 06/28/2022   Status post repeat low transverse cesarean section 09/21/2021   Scalp lesion 07/25/2019   Herpes infection 06/23/2017   Type 2 diabetes mellitus (HCC) 02/13/2017   Bipolar affective disorder, depressed, severe (HCC)    ADHD (attention deficit hyperactivity disorder) 06/19/2015   Bipolar 1 disorder (HCC) 06/19/2015   History of classical cesarean section 01/07/2015   Hx of preeclampsia, prior  pregnancy, currently pregnant    Essential hypertension, benign 11/27/2013   Adjustment disorder with depressed mood 08/13/2013   Abdominal pain 12/04/2012   Pseudocyesis    Home Medication(s) Prior to Admission medications   Medication Sig Start Date End Date Taking? Authorizing Provider  amLODipine  (NORVASC ) 5 MG tablet Take 1 tablet (5 mg total) by mouth at bedtime. Patient taking differently: Take 5 mg by mouth daily. 01/11/22   Goble Last, MD  diazepam (VALIUM) 5 MG tablet Take 5 mg by mouth every 6 (six) hours as needed for anxiety. 02/27/23   [provider]  gabapentin  (NEURONTIN ) 100 MG capsule Take 100 mg by mouth 2 (two) times daily as needed (nerve pain). 10/05/22   [provider]  ibuprofen  (ADVIL ) 800 MG tablet Take 800 mg by mouth 2 (two) times daily as needed for mild pain (pain score 1-3) or moderate pain (pain score 4-6). 10/05/22   [provider]  levETIRAcetam  (KEPPRA ) 750 MG tablet Take 1 tablet (750 mg total) by mouth 2 (two) times daily. 05/07/23   Bauer, Collin S, PA-C  sertraline  (ZOLOFT ) 50 MG tablet Take 1 tablet (50 mg total) by mouth daily. Patient not taking: Reported on 04/03/2023 03/19/23   Sueellen Emery, MD  Past Surgical History Past Surgical History:  Procedure Laterality Date   CESAREAN SECTION N/A 09/01/2012   Procedure:  Primary cesarean section with delivery of baby girl at 75.  Apgars 1/1.  ;  Surgeon: Heide Livings, MD;  Location: [facility]S;  Service: Obstetrics;  Laterality: N/A;   CESAREAN SECTION N/A 01/07/2015   Procedure: REPEAT CESAREAN SECTION;  Surgeon: Gabrielle Joiner, MD;  Location: [facility];  Service: Obstetrics;  Laterality: N/A;   CESAREAN SECTION WITH BILATERAL TUBAL LIGATION N/A 09/21/2021   Procedure: CESAREAN SECTION WITH BILATERAL TUBAL LIGATION;  Surgeon: Artemisa Bile, MD;   Location: [facility];  Service: Obstetrics;  Laterality: N/A;   DILATION AND CURETTAGE OF UTERUS     NASAL SEPTUM SURGERY     UMBILICAL HERNIA REPAIR N/A 11/21/2022   Procedure: HERNIA REPAIR UMBILICAL ADULT;  Surgeon: Marny Sires, Avon Boers, MD;  Location: [facility];  Service: General;  Laterality: N/A;   WISDOM TOOTH EXTRACTION     Family History Family History  Problem Relation Age of Onset   Kidney disease Mother    Lupus Mother    Vision loss Father    Hypertension Father    Cancer Sister    Asthma Brother    Diabetes Maternal Grandfather    Heart disease Neg Hx     Social History Social History   Tobacco Use   Smoking status: Never    Passive exposure: Never   Smokeless tobacco: Never  Vaping Use   Vaping status: Never Used  Substance Use Topics   Alcohol use: No    Alcohol/week: 0.0 standard drinks of alcohol   Drug use: No   Allergies Patient has no known allergies.  Review of Systems Review of Systems  Physical Exam Vital Signs  I have reviewed the triage vital signs BP 133/87 (BP Location: Left Arm)   Pulse 61   Temp (!) 97.5 F (36.4 C) (Axillary)   Resp 18   SpO2 100%   Physical Exam Vitals reviewed.  Eyes:     Pupils: Pupils are equal, round, and reactive to light.  Cardiovascular:     Rate and Rhythm: Normal rate.  Pulmonary:     Effort: Pulmonary effort is normal.  Abdominal:     General: Abdomen is flat.  Neurological:     Mental Status: She is alert.     Comments: Postictal, but patient has meaningful responses to commands and painful stimuli     ED Results and Treatments Labs (all labs ordered are listed, but only abnormal results are displayed) Labs Reviewed  CBC WITH DIFFERENTIAL/PLATELET  MAGNESIUM   RAPID URINE DRUG SCREEN, HOSP PERFORMED  LEVETIRACETAM  LEVEL  HCG, QUANTITATIVE, PREGNANCY  BASIC METABOLIC PANEL WITH GFR                                                                                                                           Radiology No results found.  Pertinent labs & imaging results that  were available during my care of the patient were reviewed by me and considered in my medical decision making (see MDM for details).  Medications Ordered in ED Medications - No data to display                                                                                                                                   Procedures Procedures  (including critical care time)  Medical Decision Making / ED Course   This patient presents to the ED for concern of possible seizure, this involves an extensive number of treatment options, and is a complaint that carries with it a high risk of complications and morbidity.  The differential diagnosis includes seizure disorder, less likely syncope, pseudoseizure.  MDM: Reviewed the patient's recent EEG which did not show any epileptiform activity.  Patient takes 750 of Keppra  twice daily.  Basic labs ordered.  Patient not exhibiting any seizure episodes at this time.  Reviewed the patient's most recent neurology office note.  Will place a neurology consultation for additional recommendations.  No indication for head imaging.   Reassessment 7:10 PM-patient has returned to baseline.  She reports that she had gotten upset, began to feel as though she might had a seizure, sat down, and then reportedly had what was described as a tonic-clonic seizure.  Patient has been compliant with her medications.  Spoke with Dr. Sal from neurology who recommended increasing the patient's Keppra  to 1000 mg twice daily.  Have provided her with a dose here, have sent to her pharmacy.  Will discharge.  Additional history obtained: -Additional history obtained from grandmother bedside -External records from outside source obtained and reviewed including: Chart review including previous notes, labs, imaging, consultation notes   Lab Tests: -I ordered, reviewed, and interpreted labs.    The pertinent results include:   Labs Reviewed  CBC WITH DIFFERENTIAL/PLATELET  MAGNESIUM   RAPID URINE DRUG SCREEN, HOSP PERFORMED  LEVETIRACETAM  LEVEL  HCG, QUANTITATIVE, PREGNANCY  BASIC METABOLIC PANEL WITH GFR      EKG my independent review of the patient's EKG shows no ST segment depressions or elevations, no T wave inversions, no evidence of acute ischemia.  EKG Interpretation Date/Time:    Ventricular Rate:    PR Interval:    QRS Duration:    QT Interval:    QTC Calculation:   R Axis:      Text Interpretation:           Imaging Studies ordered: I ordered imaging studies including  I independently visualized and interpreted imaging. I agree with the radiologist interpretation   Medicines ordered and prescription drug management: No orders of the defined types were placed in this encounter.   -I have reviewed the patients home medicines and have made adjustments as needed    Cardiac Monitoring: The patient was maintained on a cardiac monitor.  I personally viewed and interpreted the cardiac monitored which showed  an underlying rhythm of: Normal sinus rhythm  Social Determinants of Health:  Factors impacting patients care include: Lack of access to primary care   Reevaluation: After the interventions noted above, I reevaluated the patient and found that they have :improved  Co morbidities that complicate the patient evaluation  Past Medical History:  Diagnosis Date   ADHD (attention deficit hyperactivity disorder)    Anemia    Anxiety    Bipolar 1 disorder (HCC)    Chlamydia 05/31/2010   Chronic constipation    Complication of anesthesia    Depression    depression   Dysrhythmia    Fibroid    GERD (gastroesophageal reflux disease)    with pregnancy   Gestational diabetes    current pregnancy   Gonorrhea contact, treated    Headache    Hypertension    Infection    UTI   PONV (postoperative nausea and vomiting)    Pregnancy induced  hypertension    previous pregnancy   Pseudocyesis 2013   Seen in MAU for percieved FM, abd distension. Normal exam.    Seizures (HCC)    Sickle cell trait (HCC)    Type 2 diabetes mellitus (HCC) 02/13/2017      Dispostion: I considered admission for this patient, however the patient is appropriate for outpatient follow-up.     Final Clinical Impression(s) / ED Diagnoses Final diagnoses:  None     @PCDICTATION @    Afton Horse T, DO 07/30/23 1926

## 2023-07-30 NOTE — Discharge Instructions (Addendum)
 While you were in the emergency room, your blood work done that was normal.  Your potassium was a little bit low, I sent you some potassium pills to take for the next 1 week.  Your Keppra  dose has been increased to 1000 mg 2 times per day.  I have sent this prescription to your pharmacy.  Please follow-up with your neurologist.  Do not drive until your seizures are under better control.

## 2023-08-01 LAB — LEVETIRACETAM LEVEL: Levetiracetam Lvl: 13.3 ug/mL (ref 10.0–40.0)

## 2023-08-24 ENCOUNTER — Telehealth: Payer: Self-pay | Admitting: Neurology

## 2023-08-24 NOTE — Telephone Encounter (Signed)
 Pt c/o: seizure Missed medications?  Yes she doesn't always take the night time ones, Sleep deprived?  Yes.   Can't sleep  Alcohol intake?  No. Increased stress? Yes.   Any change in medication color or shape? Yes.   Its a white pill before it was a orange pink  Did your notice any trigger? She said she noticed trigger  Back to their usual baseline self?  Yes.  . If no, advise go to ER Current medications prescribed by Dr. Ty Gales:   levETIRAcetam  (KEPPRA ) 1000 MG tablet 1 tablet (1,000 mg total) by mouth 2 (two) times daily.

## 2023-08-24 NOTE — Telephone Encounter (Signed)
 Pt was seen in the ED, she was seeing if she can get a sooner appt than 12/11/23. She had a seizure and has had a HA since.

## 2023-09-01 NOTE — Telephone Encounter (Signed)
 Ok for June 3 opening at 1pm, thanks

## 2023-09-05 ENCOUNTER — Ambulatory Visit: Payer: MEDICAID | Admitting: Neurology

## 2023-09-19 ENCOUNTER — Telehealth: Payer: Self-pay | Admitting: Neurology

## 2023-09-19 NOTE — Telephone Encounter (Signed)
 Pt. Seeing flashes of lights being dizziness and keeps drooling and feels like she needs an earlier appt, please call back PT.

## 2023-09-22 ENCOUNTER — Ambulatory Visit: Payer: MEDICAID | Admitting: Neurology

## 2023-09-25 ENCOUNTER — Ambulatory Visit: Payer: MEDICAID | Admitting: Neurology

## 2023-09-25 ENCOUNTER — Encounter: Payer: Self-pay | Admitting: Neurology

## 2023-09-25 VITALS — BP 140/83 | HR 64 | Ht 62.0 in | Wt 134.0 lb

## 2023-09-25 DIAGNOSIS — G40909 Epilepsy, unspecified, not intractable, without status epilepticus: Secondary | ICD-10-CM

## 2023-09-25 DIAGNOSIS — R519 Headache, unspecified: Secondary | ICD-10-CM

## 2023-09-25 MED ORDER — TOPIRAMATE 25 MG PO TABS
ORAL_TABLET | ORAL | 6 refills | Status: DC
Start: 2023-09-25 — End: 2023-12-11

## 2023-09-25 MED ORDER — LEVETIRACETAM ER 500 MG PO TB24: ORAL_TABLET | ORAL | 6 refills | Status: DC

## 2023-09-25 NOTE — Telephone Encounter (Signed)
Scheduled 6/23

## 2023-09-25 NOTE — Patient Instructions (Signed)
 Good to see you.  Let's switch to extended-release Keppra  (Levetiracetam ) 500mg  so you take it only in the morning: Take 2 tablets every morning (1000mg  every morning)  2. Start Topiramate 25mg : take 1 tablet every night  3. Keep a calendar of your symptoms, follow-up in 3 months, call for any changes   Seizure Precautions: 1. If medication has been prescribed for you to prevent seizures, take it exactly as directed.  Do not stop taking the medicine without talking to your doctor first, even if you have not had a seizure in a long time.   2. Avoid activities in which a seizure would cause danger to yourself or to others.  Don't operate dangerous machinery, swim alone, or climb in high or dangerous places, such as on ladders, roofs, or girders.  Do not drive unless your doctor says you may.  3. If you have any warning that you may have a seizure, lay down in a safe place where you can't hurt yourself.    4.  No driving for 6 months from last seizure, as per [place]  state law.   Please refer to the following link on the [employer]'s website for more information: http://www.epilepsyfoundation.org/answerplace/Social/driving/drivingu.cfm   5.  Maintain good sleep hygiene. Avoid alcohol.  6.  Contact your doctor if you have any problems that may be related to the medicine you are taking.  7.  Call 911 and bring the patient back to the ED if:        A.  The seizure lasts longer than 5 minutes.       B.  The patient doesn't awaken shortly after the seizure  C.  The patient has new problems such as difficulty seeing, speaking or moving  D.  The patient was injured during the seizure  E.  The patient has a temperature over 102 F (39C)  F.  The patient vomited and now is having trouble breathing

## 2023-09-25 NOTE — Progress Notes (Signed)
 NEUROLOGY FOLLOW UP OFFICE NOTE  Stephanie Mack [id number] 11-06-1990  HISTORY OF PRESENT ILLNESS: I had the pleasure of seeing Stephanie Mack in follow-up in the neurology clinic on 09/25/2023.  The patient was last seen 6 months ago for seizures. She is again accompanied by her mother who helps supplement the history today. Her son Stephanie Mack is also present.  Records and images were personally reviewed where available.  I personally reviewed MRI brain with and without contrast done 05/2023 which was normal, hippocampi symmetric with no abnormal signal or enhancement seen. Her 1-hour EEG in 04/2023 and 41-hour ambulatory EEG in 05/2023 were normal, typical events were not reported. She was in the ER on 04/11/23 after an episode where she started smacking and rubbing lips together then she dropped down and had a convulsion. She had 4 episodes lasting a total of 8 minutes that day. She had not taken her Keppra  that day. She was back in the ER on 05/07/23 after a seizure at church with left hand twitching then full body twitching with loss of consciousness lasting 4 minutes. Keppra  level 15.8. Keppra  increased to 750mg  BID. On 07/30/23, she had a 5 minute seizure with fixed gaze to the right, eyelid twitching, unresponsive. EMS gave IM Midazolam . There was apparently stress the day prior with people very hostile coming to her house and banging on the door. Keppra  level 13.3. Dose increased to 1000mg  BID. She denies any convulsions since 07/2023. Her mother witnessed one of the seizures, she was talking then alerted her mother who saw her eyes were closed then she started shaking, no tongue bite or incontinence. She keeps seeing flashes of light on her left side since May, she would be staring out then has a headache on the left side. No nausea/vomiting. Her mother sees her starting off every other day for 2-3 minutes, sometimes she would not respond, last episode was Friday. She has times where she is sick to her stomach  and shaky, no associated headache. When she starts staring, she has a metallic taste in her mouth and feels sick in her stomach so she lays down. She was also reporting drooling, this is constant, she wipes her mouth all the time and spits daily. She also stutters. She still has headaches in the frontal temporal regions around once a week. She has tingling in fingers of both hands. Her ankles hurt. She gets 8 hours of interrupted sleep. She sometimes forgets to take her night dose, she states she has not taken today's morning dose because she has a lot on her mind.    History on Initial Assessment 04/03/2023: This is a 33 year old right-handed woman with a history of hypertension,bipolar disorder, depression, presenting for evaluation of seizures. They report she had a febrile seizure at age 67 and was not on seizure medication, seizure-free until a year ago when she started having recurrent episodes where her hands start sweating and tingling, her mouth and tongue get dry. She gets nauseated then wakes up on the floor. Her grandmother reports her eyes start to flutter, she stops talking then leans over/slumps down with drooling, shaking/jerking of her lips and hands, unresponsive for 5-6 minutes. She opens her eyes after and cannot talk, cold rags of her face really help. She tastes blood after an episode. No tongue bite, incontinence, focal weakness. They note that a lot of stress brings it on. She reports gaps in time, her grandmother notes staring episodes, she gets real confused.  She reports  jerking in her hands occurring separately. She has been to the ER several times since 10/15 for these episodes. She had a witnessed episode on 10/15 where she became unresponsive in the chair, with drooling and eyelid fluttering for 1-2 minutes. She was started on Levetiracetam  500mg  BID and reports last episode was 03/19/23. She has some drowsiness and possibly worsening depression. She reports taking Gabapentin  for  the past 6 months for bone pain. She is on Diazepam prn for anxiety, taking it around once a week. Bloodwork in the ER was normal, UDS positive for benzodiazepines and THC. I personally reviewed head CT without contrast which did not show any changes.  She has frequent headaches occurring around once a week, usually when she gets nervous, sick, or when BP is high. She has a pressure in the frontal region with nausea, sensitivity to lights and sounds. Her mother and maternal grandmother have migraines. She denies any dizziness, diplopia, dysarthria/dysphagia, neck pain, bladder dysfunction. She has occasional back pain and a lot of constipation. Mood is depressed, her grandmother reports she gets depressed easily (even before the episodes started), however this has worsened with recent events. She used to be on Sertraline  prescribed by [facility] but this was restarted in the ER. They report this makes her shake. She has been scared at night and sleeps during the day, getting 3-4 hours of sleep. No alcohol. She lives with her grandmother, mother, and 2 children (ages 1 and 76). No pregnancy plans, she is s/p tubal ligation. She does not drive.   Her paternal aunt has seizures. She had a febrile seizure at age 46. She had a normal birth and early development.  There is no history of CNS infections such as meningitis/encephalitis, significant traumatic brain injury, neurosurgical procedures.  PAST MEDICAL HISTORY: Past Medical History:  Diagnosis Date   ADHD (attention deficit hyperactivity disorder)    Anemia    Anxiety    Bipolar 1 disorder (HCC)    Chlamydia 05/31/2010   Chronic constipation    Complication of anesthesia    Depression    depression   Dysrhythmia    Fibroid    GERD (gastroesophageal reflux disease)    with pregnancy   Gestational diabetes    current pregnancy   Gonorrhea contact, treated    Headache    Hypertension    Infection    UTI   PONV (postoperative nausea and vomiting)     Pregnancy induced hypertension    previous pregnancy   Pseudocyesis 2013   Seen in [facility] for percieved FM, abd distension. Normal exam.    Seizures (HCC)    Sickle cell trait (HCC)    Type 2 diabetes mellitus (HCC) 02/13/2017    MEDICATIONS: Current Outpatient Medications on File Prior to Visit  Medication Sig Dispense Refill   gabapentin  (NEURONTIN ) 100 MG capsule Take 100 mg by mouth 2 (two) times daily as needed (nerve pain).     ibuprofen  (ADVIL ) 800 MG tablet Take 800 mg by mouth 2 (two) times daily as needed for mild pain (pain score 1-3) or moderate pain (pain score 4-6).     levETIRAcetam  (KEPPRA ) 1000 MG tablet Take 1 tablet (1,000 mg total) by mouth 2 (two) times daily. 60 tablet 0   amLODipine  (NORVASC ) 5 MG tablet Take 1 tablet (5 mg total) by mouth at bedtime. (Patient not taking: Reported on 09/25/2023) 90 tablet 3   diazepam (VALIUM) 5 MG tablet Take 5 mg by mouth every 6 (six) hours as needed  for anxiety. (Patient not taking: Reported on 09/25/2023)     potassium chloride  (KLOR-CON ) 10 MEQ tablet Take 1 tablet (10 mEq total) by mouth daily for 7 days. (Patient not taking: Reported on 09/25/2023) 7 tablet 0   sertraline  (ZOLOFT ) 50 MG tablet Take 1 tablet (50 mg total) by mouth daily. (Patient not taking: Reported on 09/25/2023) 30 tablet 0   No current facility-administered medications on file prior to visit.    ALLERGIES: No Known Allergies  FAMILY HISTORY: Family History  Problem Relation Age of Onset   Kidney disease Mother    Lupus Mother    Vision loss Father    Hypertension Father    Cancer Sister    Asthma Brother    Diabetes Maternal Grandfather    Heart disease Neg Hx     SOCIAL HISTORY: Social History   Socioeconomic History   Marital status: Single    Spouse name: Not on file   Number of children: Not on file   Years of education: Not on file   Highest education level: 12th grade  Occupational History   Not on file  Tobacco Use   Smoking  status: Never    Passive exposure: Never   Smokeless tobacco: Never  Vaping Use   Vaping status: Never Used  Substance and Sexual Activity   Alcohol use: No    Alcohol/week: 0.0 standard drinks of alcohol   Drug use: No   Sexual activity: Not Currently    Partners: Male    Birth control/protection: None  Other Topics Concern   Not on file  Social History Narrative   Single, one daughter born 2016   Are you right handed or left handed? Right    Are you currently employed ? No    What is your current occupation? NA   Do you live at home alone? no   Who lives with you? Family    What type of home do you live in: 1 story or 2 story?  2 story with steps              Social Drivers of Health   Financial Resource Strain: Low Risk  (08/07/2022)   Overall Financial Resource Strain (CARDIA)    Difficulty of Paying Living Expenses: Not hard at all  Food Insecurity: No Food Insecurity (08/07/2022)   Hunger Vital Sign    Worried About Running Out of Food in the Last Year: Never true    Ran Out of Food in the Last Year: Never true  Transportation Needs: No Transportation Needs (08/07/2022)   PRAPARE - Administrator, Civil Service (Medical): No    Lack of Transportation (Non-Medical): No  Physical Activity: Insufficiently Active (08/07/2022)   Exercise Vital Sign    Days of Exercise per Week: 2 days    Minutes of Exercise per Session: 20 min  Stress: No Stress Concern Present (08/07/2022)   [employer] of Occupational Health - Occupational Stress Questionnaire    Feeling of Stress : Not at all  Social Connections: Unknown (08/07/2022)   Social Connection and Isolation Panel    Frequency of Communication with Friends and Family: More than three times a week    Frequency of Social Gatherings with Friends and Family: Never    Attends Religious Services: 1 to 4 times per year    Active Member of [employer] or Organizations: No    Attends Banker Meetings: Not on file     Marital Status: Patient  declined  Intimate Partner Violence: Not At Risk (11/28/2017)   Humiliation, Afraid, Rape, and Kick questionnaire    Fear of Current or Ex-Partner: No    Emotionally Abused: No    Physically Abused: No    Sexually Abused: No     PHYSICAL EXAM: Vitals:   09/25/23 1436  BP: (!) 140/83  Pulse: 64  SpO2: 100%   General: No acute distress Head:  Normocephalic/atraumatic Skin/Extremities: No rash, no edema Neurological Exam: alert and awake. No aphasia or dysarthria. Fund of knowledge is appropriate.  Attention and concentration are normal.   Cranial nerves: Pupils equal, round. Extraocular movements intact with no nystagmus. Visual fields full.  No facial asymmetry.  Motor: Bulk and tone normal, muscle strength 5/5 throughout with no pronator drift.   Finger to nose testing intact.  Gait narrow-based and steady, able to tandem walk adequately.  Romberg negative.   IMPRESSION: This is a 33 yo RH woman with a history of hypertension,bipolar disorder, depression, with recurrent seizures described as staring episodes and convulsions. MRI brain and ambulatory EEG normal, however typical events were not captured. She has had more seizures since last visit and now takes Levetiracetam  1000mg  BID, no convulsions since 07/2023 but they continue to report staring spells. There may be medication compliance issues contributing, we discussed switching to extended-release Levetiracetam  500mg : 2 tabs every morning to help with compliance. We discussed adding on Topiramate  25mg  daily to help with seizures and migraines, side effects discussed. She was advised to keep a calendar of her symptoms, we may consider inpatient video EEG monitoring for further characterization in the future. She does not drive. Follow-up in 3 months, call for any changes.   Thank you for allowing me to participate in her care.  Please do not hesitate to call for any questions or concerns.    Darice Shivers,  M.D.   CC: [facility]

## 2023-12-11 ENCOUNTER — Other Ambulatory Visit: Payer: Self-pay | Admitting: *Deleted

## 2023-12-11 ENCOUNTER — Telehealth: Payer: MEDICAID | Admitting: Neurology

## 2023-12-11 ENCOUNTER — Encounter: Payer: Self-pay | Admitting: Neurology

## 2023-12-11 VITALS — Ht 62.0 in | Wt 134.0 lb

## 2023-12-11 DIAGNOSIS — H539 Unspecified visual disturbance: Secondary | ICD-10-CM | POA: Diagnosis not present

## 2023-12-11 DIAGNOSIS — G40909 Epilepsy, unspecified, not intractable, without status epilepticus: Secondary | ICD-10-CM | POA: Diagnosis not present

## 2023-12-11 MED ORDER — LEVETIRACETAM ER 500 MG PO TB24: ORAL_TABLET | ORAL | 11 refills | Status: DC

## 2023-12-11 MED ORDER — TOPIRAMATE 25 MG PO TABS: ORAL_TABLET | ORAL | 11 refills | Status: DC

## 2023-12-11 NOTE — Progress Notes (Signed)
 Virtual Visit via Video Note The purpose of this virtual visit is to provide medical care while limiting exposure to the novel coronavirus.    Consent was obtained for video visit:  Yes.   Answered questions that patient had about telehealth interaction:  Yes.   I discussed the limitations, risks, security and privacy concerns of performing an evaluation and management service by telemedicine. I also discussed with the patient that there may be a patient responsible charge related to this service. The patient expressed understanding and agreed to proceed.  Pt location: Home Physician Location: office Name of referring provider:  [facility] I connected with Stephanie Mack at patients initiation/request on 12/11/2023 at  2:00 PM EDT by video enabled telemedicine application and verified that I am speaking with the correct person using two identifiers. Pt MRN:  [id number] Pt DOB:  [birth year] Video Participants:  Stephanie Mack   History of Present Illness:  The patient had a virtual video visit on 12/11/2023. She was last seen in the neurology clinic 3 months ago for seizures. MRI brain and 41-hour ambulatory EEG normal. On her last visit, she continued to report staring spells. No convulsions since 07/2023. She had difficulty with BID dosing of Levetiracetam  and was switched to Levetiracetam  ER 1000mg  daily. She was also reporting more migraines, Topiramate  25mg  daily was added. She reports that she takes an additional LEV if she is feeling sick. She continues to report staring spells, sometimes she sees flashes of light. Last staring spell was a week ago. She feels the flashing lights are getting bad, she always sees it on the left side and states it is only affecting the left eye. The headaches are not very often, occurring if she misses a dose of medication. She has had 2 headaches in the past 2 days. She feels the medications make her tired. She reports forgetfulness. She only takes  gabapentin  as needed when her bones start hurting.    History on Initial Assessment 04/03/2023: This is a 33 year old right-handed woman with a history of hypertension,bipolar disorder, depression, presenting for evaluation of seizures. They report she had a febrile seizure at age 82 and was not on seizure medication, seizure-free until a year ago when she started having recurrent episodes where her hands start sweating and tingling, her mouth and tongue get dry. She gets nauseated then wakes up on the floor. Her grandmother reports her eyes start to flutter, she stops talking then leans over/slumps down with drooling, shaking/jerking of her lips and hands, unresponsive for 5-6 minutes. She opens her eyes after and cannot talk, cold rags of her face really help. She tastes blood after an episode. No tongue bite, incontinence, focal weakness. They note that a lot of stress brings it on. She reports gaps in time, her grandmother notes staring episodes, she gets real confused.  She reports jerking in her hands occurring separately. She has been to the ER several times since 10/15 for these episodes. She had a witnessed episode on 10/15 where she became unresponsive in the chair, with drooling and eyelid fluttering for 1-2 minutes. She was started on Levetiracetam  500mg  BID and reports last episode was 03/19/23. She has some drowsiness and possibly worsening depression. She reports taking Gabapentin  for the past 6 months for bone pain. She is on Diazepam prn for anxiety, taking it around once a week. Bloodwork in the ER was normal, UDS positive for benzodiazepines and THC. I personally reviewed head CT without contrast which did  not show any changes.  She has frequent headaches occurring around once a week, usually when she gets nervous, sick, or when BP is high. She has a pressure in the frontal region with nausea, sensitivity to lights and sounds. Her mother and maternal grandmother have migraines. She denies  any dizziness, diplopia, dysarthria/dysphagia, neck pain, bladder dysfunction. She has occasional back pain and a lot of constipation. Mood is depressed, her grandmother reports she gets depressed easily (even before the episodes started), however this has worsened with recent events. She used to be on Sertraline  prescribed by [facility] but this was restarted in the ER. They report this makes her shake. She has been scared at night and sleeps during the day, getting 3-4 hours of sleep. No alcohol. She lives with her grandmother, mother, and 2 children (ages 1 and 23). No pregnancy plans, she is s/p tubal ligation. She does not drive.   Her paternal aunt has seizures. She had a febrile seizure at age 80. She had a normal birth and early development.  There is no history of CNS infections such as meningitis/encephalitis, significant traumatic brain injury, neurosurgical procedures.  Diagnostic Data: MRI brain with and without contrast done 05/2023 was normal, hippocampi symmetric with no abnormal signal or enhancement seen.   1-hour EEG in 04/2023 normal 41-hour ambulatory EEG in 05/2023 normal, typical events were not reported.   Current Outpatient Medications on File Prior to Visit  Medication Sig Dispense Refill   amLODipine  (NORVASC ) 5 MG tablet Take 1 tablet (5 mg total) by mouth at bedtime. (Patient not taking: Reported on 09/25/2023) 90 tablet 3   diazepam (VALIUM) 5 MG tablet Take 5 mg by mouth every 6 (six) hours as needed for anxiety. (Patient not taking: Reported on 09/25/2023)     gabapentin  (NEURONTIN ) 100 MG capsule Take 100 mg by mouth 2 (two) times daily as needed (nerve pain).     ibuprofen  (ADVIL ) 800 MG tablet Take 800 mg by mouth 2 (two) times daily as needed for mild pain (pain score 1-3) or moderate pain (pain score 4-6).     levETIRAcetam  (KEPPRA  XR) 500 MG 24 hr tablet Take 2 tablets every morning 60 tablet 6   potassium chloride  (KLOR-CON ) 10 MEQ tablet Take 1 tablet (10 mEq total) by  mouth daily for 7 days. (Patient not taking: Reported on 09/25/2023) 7 tablet 0   sertraline  (ZOLOFT ) 50 MG tablet Take 1 tablet (50 mg total) by mouth daily. (Patient not taking: Reported on 09/25/2023) 30 tablet 0   topiramate  (TOPAMAX ) 25 MG tablet Take 1 tablet every night 30 tablet 6   No current facility-administered medications on file prior to visit.     Observations/Objective:   GEN:  The patient appears stated age and is in NAD. Neurological examination: Patient is awake, alert. No aphasia or dysarthria. Intact fluency and comprehension. Cranial nerves: Extraocular movements intact. No facial asymmetry. Motor: moves all extremities symmetrically, at least anti-gravity x 4.    Assessment and Plan:   This is a 33 yo RH woman with a history of hypertension,bipolar disorder, depression, with recurrent seizures described as staring episodes and convulsions. MRI brain and ambulatory EEG normal, however typical events were not captured. She denies any convulsions since 07/2023 but continues to report staring spells. She has had more seizures since last visit and now takes Levetiracetam  1000mg  BID, no convulsions since 07/2023 but they continue to report staring spells. She also reports flashes of light however states that it only affects the  left eye. Referral to [facility] will be sent. Increase Topiramate  to 50mg  at bedtime (25mg : 2 tabs at bedtime). Continue Levetiracetam  ER 1000mg  at bedtime (500mg : 2 tabs at bedtime). She was advised to continue symptom calendar as we adjust medications. If symptoms continue, we may consider inpatient vEEG monitoring for characterization. She does not drive. Follow-up in 3-4 months, call for any changes.    Follow Up Instructions:   -I discussed the assessment and treatment plan with the patient. The patient was provided an opportunity to ask questions and all were answered. The patient agreed with the plan and demonstrated an understanding of the  instructions.   The patient was advised to call back or seek an in-person evaluation if the symptoms worsen or if the condition fails to improve as anticipated.     Darice CHRISTELLA Shivers, MD

## 2023-12-11 NOTE — Patient Instructions (Signed)
 Good to see you.  Increase the Topiramate  (Topamax ) 25mg : Take 2 tablet every night  2. Continue Levetiracetam  (Keppra ) ER 500mg : Take 2 tablets every night  3. Referral will be sent to [facility] for evaluation of flashing lights on left eye  4. Keep a calendar of symptoms as we increase dose of medication. Follow-up in 3-4 months, call for any changes   Seizure Precautions: 1. If medication has been prescribed for you to prevent seizures, take it exactly as directed.  Do not stop taking the medicine without talking to your doctor first, even if you have not had a seizure in a long time.   2. Avoid activities in which a seizure would cause danger to yourself or to others.  Don't operate dangerous machinery, swim alone, or climb in high or dangerous places, such as on ladders, roofs, or girders.  Do not drive unless your doctor says you may.  3. If you have any warning that you may have a seizure, lay down in a safe place where you can't hurt yourself.    4.  No driving for 6 months from last seizure, as per [place]  state law.   Please refer to the following link on the [employer]'s website for more information: http://www.epilepsyfoundation.org/answerplace/Social/driving/drivingu.cfm   5.  Maintain good sleep hygiene. Avoid alcohol.  6.  Notify your neurology if you are planning pregnancy or if you become pregnant.  7.  Contact your doctor if you have any problems that may be related to the medicine you are taking.  8.  Call 911 and bring the patient back to the ED if:        A.  The seizure lasts longer than 5 minutes.       B.  The patient doesn't awaken shortly after the seizure  C.  The patient has new problems such as difficulty seeing, speaking or moving  D.  The patient was injured during the seizure  E.  The patient has a temperature over 102 F (39C)  F.  The patient vomited and now is having trouble breathing

## 2024-02-06 ENCOUNTER — Other Ambulatory Visit (HOSPITAL_COMMUNITY): Payer: Self-pay

## 2024-02-13 ENCOUNTER — Encounter: Payer: Self-pay | Admitting: Neurology

## 2024-03-23 ENCOUNTER — Emergency Department (HOSPITAL_COMMUNITY)
Admission: EM | Admit: 2024-03-23 | Discharge: 2024-03-23 | Disposition: A | Discharge status: Home / Self Care | Payer: MEDICAID | Attending: Emergency Medicine | Admitting: Emergency Medicine

## 2024-03-23 DIAGNOSIS — R519 Headache, unspecified: Secondary | ICD-10-CM | POA: Insufficient documentation

## 2024-03-23 DIAGNOSIS — E119 Type 2 diabetes mellitus without complications: Secondary | ICD-10-CM | POA: Insufficient documentation

## 2024-03-23 DIAGNOSIS — Z79899 Other long term (current) drug therapy: Secondary | ICD-10-CM | POA: Diagnosis not present

## 2024-03-23 DIAGNOSIS — I10 Essential (primary) hypertension: Secondary | ICD-10-CM | POA: Diagnosis not present

## 2024-03-23 DIAGNOSIS — R569 Unspecified convulsions: Secondary | ICD-10-CM | POA: Diagnosis present

## 2024-03-23 LAB — COMPREHENSIVE METABOLIC PANEL WITH GFR
ALT: 11 U/L (ref 0–44)
AST: 15 U/L (ref 15–41)
Albumin: 4.1 g/dL (ref 3.5–5.0)
Alkaline Phosphatase: 63 U/L (ref 38–126)
Anion gap: 8 (ref 5–15)
BUN: 6 mg/dL (ref 6–20)
CO2: 23 mmol/L (ref 22–32)
Calcium: 8.7 mg/dL — ABNORMAL LOW (ref 8.9–10.3)
Chloride: 108 mmol/L (ref 98–111)
Creatinine, Ser: 0.46 mg/dL (ref 0.44–1.00)
GFR, Estimated: >60 mL/min
Glucose, Bld: 106 mg/dL — ABNORMAL HIGH (ref 70–99)
Potassium: 3.4 mmol/L — ABNORMAL LOW (ref 3.5–5.1)
Sodium: 139 mmol/L (ref 135–145)
Total Bilirubin: 0.6 mg/dL (ref 0.0–1.2)
Total Protein: 7.2 g/dL (ref 6.5–8.1)

## 2024-03-23 LAB — CBC
HCT: 40 % (ref 36.0–46.0)
Hemoglobin: 13 g/dL (ref 12.0–15.0)
MCH: 26.4 pg (ref 26.0–34.0)
MCHC: 32.5 g/dL (ref 30.0–36.0)
MCV: 81.1 fL (ref 80.0–100.0)
Platelets: 207 K/uL (ref 150–400)
RBC: 4.93 MIL/uL (ref 3.87–5.11)
RDW: 13 % (ref 11.5–15.5)
WBC: 3.5 K/uL — ABNORMAL LOW (ref 4.0–10.5)
nRBC: 0 % (ref 0.0–0.2)

## 2024-03-23 LAB — HCG, SERUM, QUALITATIVE: Preg, Serum: NEGATIVE

## 2024-03-23 LAB — CBG MONITORING, ED: Glucose-Capillary: 99 mg/dL (ref 70–99)

## 2024-03-23 MED ORDER — SODIUM CHLORIDE 0.9 % IV BOLUS
1000.0000 mL | Freq: Once | INTRAVENOUS | Status: AC
  Administered 2024-03-23: 1000 mL via INTRAVENOUS

## 2024-03-23 MED ORDER — LORAZEPAM 2 MG/ML IJ SOLN
4.0000 mg | INTRAMUSCULAR | Status: DC | PRN
  Administered 2024-03-23: 4 mg via INTRAVENOUS
  Filled 2024-03-23: qty 2

## 2024-03-23 NOTE — ED Notes (Signed)
 Nurse ambulated pt about 30 feet. Pt in no acute distress and cites she is less dizzy. Pt only stumbled upon initial standing.

## 2024-03-23 NOTE — ED Provider Notes (Signed)
 " [place] EMERGENCY DEPARTMENT AT [facility] Provider Note   CSN: [id number] Arrival date & time: 03/23/24  1146     Patient presents with: Headache   Stephanie Mack is a 33 y.o. female with history of seizures, hypertension presents with persistent headache x 2 days with reportedly witnessed seizure this morning.  Patient reportedly was shaking.  No tongue lacerations or urinary incontinence.  Was not postictal.  Has been evaluated for neurology for this.  Has had a negative brain MRI and ambulatory EEG.  Headache was not sudden or maximal in onset.  Not associate with any vision changes, extremity weakness or numbness.  Has been taking gabapentin  without any notable improvement.  Appears that she was prescribed topiramate  but has not been taking this.  Most recent neurology note recommending 1000 mg Keppra  at bedtime.  Initially patient reported that she is only taking 500 mg at bedtime.  When asked again she states that she has been taking 500 in the morning and evening.    Headache     Past Medical History:  Diagnosis Date   ADHD (attention deficit hyperactivity disorder)    Anemia    Anxiety    Bipolar 1 disorder (HCC)    Chlamydia 05/31/2010   Chronic constipation    Complication of anesthesia    Depression    depression   Dysrhythmia    Fibroid    GERD (gastroesophageal reflux disease)    with pregnancy   Gestational diabetes    current pregnancy   Gonorrhea contact, treated    Headache    Hypertension    Infection    UTI   PONV (postoperative nausea and vomiting)    Pregnancy induced hypertension    previous pregnancy   Pseudocyesis 2013   Seen in MAU for percieved FM, abd distension. Normal exam.    Seizures (HCC)    Sickle cell trait    Type 2 diabetes mellitus (HCC) 02/13/2017   Past Surgical History:  Procedure Laterality Date   CESAREAN SECTION N/A 09/01/2012   Procedure:  Primary cesarean section with delivery of baby girl at 42.   Apgars 1/1.  ;  Surgeon: Aida DELENA Na, MD;  Location: [facility];  Service: Obstetrics;  Laterality: N/A;   CESAREAN SECTION N/A 01/07/2015   Procedure: REPEAT CESAREAN SECTION;  Surgeon: Carlin DELENA Centers, MD;  Location: [facility];  Service: Obstetrics;  Laterality: N/A;   CESAREAN SECTION WITH BILATERAL TUBAL LIGATION N/A 09/21/2021   Procedure: CESAREAN SECTION WITH BILATERAL TUBAL LIGATION;  Surgeon: Bettina Muskrat, MD;  Location: [facility];  Service: Obstetrics;  Laterality: N/A;   DILATION AND CURETTAGE OF UTERUS     NASAL SEPTUM SURGERY     UMBILICAL HERNIA REPAIR N/A 11/21/2022   Procedure: HERNIA REPAIR UMBILICAL ADULT;  Surgeon: Lyndel Deward PARAS, MD;  Location: [facility];  Service: General;  Laterality: N/A;   WISDOM TOOTH EXTRACTION       Prior to Admission medications  Medication Sig Start Date End Date Taking? Authorizing Provider  amLODipine  (NORVASC ) 5 MG tablet Take 1 tablet (5 mg total) by mouth at bedtime. 01/11/22   Norbert, John C, MD  diazepam (VALIUM) 5 MG tablet Take 5 mg by mouth every 6 (six) hours as needed for anxiety. Patient not taking: Reported on 12/11/2023 02/27/23   [provider]  gabapentin  (NEURONTIN ) 100 MG capsule Take 100 mg by mouth 2 (two) times daily as needed (nerve pain). 10/05/22   [provider]  ibuprofen  (ADVIL ) 800 MG tablet Take 800 mg by mouth 2 (two) times daily as needed for mild pain (pain score 1-3) or moderate pain (pain score 4-6). 10/05/22   [provider]  levETIRAcetam  (KEPPRA  XR) 500 MG 24 hr tablet Take 2 tablets every night 12/11/23   Georjean Darice HERO, MD  mirtazapine (REMERON) 7.5 MG tablet Take 7.5 mg by mouth. 11/15/23   [provider]  potassium chloride  (KLOR-CON ) 10 MEQ tablet Take 1 tablet (10 mEq total) by mouth daily for 7 days. 07/30/23 12/11/23  Mannie Pac T, DO  sertraline  (ZOLOFT ) 50 MG tablet Take 1 tablet (50 mg total) by mouth daily. Patient not taking: Reported on 12/11/2023 03/19/23    Dean Clarity, MD  topiramate  (TOPAMAX ) 25 MG tablet Take 2 tablets every night 12/11/23   Georjean Darice HERO, MD    Allergies: Penicillin  g    Review of Systems  Neurological:  Positive for headaches.    Updated Vital Signs BP 129/80   Pulse 70   Temp 98.6 F (37 C) (Oral)   Resp 16   SpO2 100%   Physical Exam Vitals and nursing note reviewed.  Constitutional:      General: She is not in acute distress.    Appearance: She is well-developed.  HENT:     Head: Normocephalic and atraumatic.  Eyes:     Conjunctiva/sclera: Conjunctivae normal.  Cardiovascular:     Rate and Rhythm: Normal rate and regular rhythm.     Heart sounds: No murmur heard. Pulmonary:     Effort: Pulmonary effort is normal. No respiratory distress.     Breath sounds: Normal breath sounds.  Abdominal:     Palpations: Abdomen is soft.     Tenderness: There is no abdominal tenderness.  Musculoskeletal:        General: No swelling.     Cervical back: Neck supple.  Skin:    General: Skin is warm and dry.     Capillary Refill: Capillary refill takes less than 2 seconds.  Neurological:     Mental Status: She is alert.  Psychiatric:        Mood and Affect: Mood normal.     (all labs ordered are listed, but only abnormal results are displayed) Labs Reviewed  CBC - Abnormal; Notable for the following components:      Result Value   WBC 3.5 (*)    All other components within normal limits  COMPREHENSIVE METABOLIC PANEL WITH GFR - Abnormal; Notable for the following components:   Potassium 3.4 (*)    Glucose, Bld 106 (*)    Calcium  8.7 (*)    All other components within normal limits  HCG, SERUM, QUALITATIVE  LEVETIRACETAM  LEVEL  CBG MONITORING, ED  CBG MONITORING, ED    EKG: None  Radiology: No results found.   Procedures   Medications Ordered in the ED  LORazepam  (ATIVAN ) injection 4 mg (4 mg Intravenous Given 03/23/24 1321)  sodium chloride  0.9 % bolus 1,000 mL (1,000 mLs Intravenous  New Bag/Given 03/23/24 1831)    Clinical Course as of 03/23/24 1904  Sat Mar 23, 2024  1357 Patient evaluated for worsening headache over the past 2 days with associated reportedly witnessed seizure that occurred this morning.  Patient has had a reassuring workup to this point with a negative MRI and ambulatory EEG.  Upon arrival patient is hemodynamically stable and nontoxic-appearing.  She does not appear to be postictal and is alert and oriented.  During my  exam patient started staring off and became nonresponsive, her eyes were closed and appeared to be twitching. Cbg checked within normal limits.  Patient kept her arms elevated when passively raised.  Eyes stopped twitching when I would try to open them.  No formal diagnosis of pseudoseizures although her exam is concerning for this, Ativan  given.  Seizure-like activity stopped minutes later.  Will obtain Keppra  level and routine labs.  [JT]  1720 Mentation is improving.  Will p.o. challenge and ambulate [JT]  1838 Successful ambulation test.  Patient be discharged home.  Will follow-up with neurology.  Encouraged to take her medications as prescribed. [JT]    Clinical Course User Index [JT] Donnajean Lynwood DEL, PA-C                                 Medical Decision Making Amount and/or Complexity of Data Reviewed Labs: ordered.   This patient presents to the ED with chief complaint(s) of headache .  The complaint involves an extensive differential diagnosis and also carries with it a high risk of complications and morbidity.   Pertinent past medical history as listed in HPI  The differential diagnosis includes  Based off exam and history do not suspect CVA, TIA, subarachnoid hemorrhage, vertebral dissection, alcohol withdrawal Additional history obtained: Additional history obtained from family Records reviewed Care Everywhere/External Records  Disposition:   Patient will be discharged home. The patient has been appropriately  medically screened and/or stabilized in the ED. I have low suspicion for any other emergent medical condition which would require further screening, evaluation or treatment in the ED or require inpatient management. At time of discharge the patient is hemodynamically stable and in no acute distress. I have discussed work-up results and diagnosis with patient and answered all questions. Patient is agreeable with discharge plan. We discussed strict return precautions for returning to the emergency department and they verbalized understanding.     Social Determinants of Health:   none  This note was dictated with voice recognition software.  Despite best efforts at proofreading, errors may have occurred which can change the documentation meaning.       Final diagnoses:  Seizure [facility])    ED Discharge Orders     None          Donnajean Lynwood DEL DEVONNA 03/23/24 1904    Patsey Lot, MD 03/23/24 2308  "

## 2024-03-23 NOTE — ED Notes (Signed)
 Pt given smthg to eat. Will ambulate after eating

## 2024-03-23 NOTE — ED Triage Notes (Signed)
 BIBA from home -headache since last night. Hx of DM, seizures. Pt states this feels like before she has a seizure. Pt has not taken her meds today. 10/10 pain, blurry vision. 20 g RAC, 100 mcg Fentanyl  given PTA. 144/82 bp 82 hr 98%102 cbg

## 2024-03-23 NOTE — ED Notes (Signed)
 Pt AO4, however she is 'a little bit dizzy' upon standing and cannot walk without stumbling or dragging feet

## 2024-03-23 NOTE — Discharge Instructions (Signed)
 You were evaluated in the emergency room for headache and seizure-like activity.  Your lab work did not show any significant abnormality.  Please follow-up with your neurologist for further evaluation.  Please take your Keppra  as prescribed, 1000 mg at bedtime.  If you experience any new or worsening symptoms please return to the emergency room.

## 2024-03-25 LAB — LEVETIRACETAM LEVEL: Levetiracetam Lvl: 7.1 ug/mL — ABNORMAL LOW (ref 10.0–40.0)

## 2024-04-05 ENCOUNTER — Emergency Department (HOSPITAL_COMMUNITY)
Admission: EM | Admit: 2024-04-05 | Discharge: 2024-04-05 | Disposition: A | Discharge status: Home / Self Care | Payer: MEDICAID

## 2024-04-05 ENCOUNTER — Emergency Department (HOSPITAL_COMMUNITY): Payer: MEDICAID

## 2024-04-05 DIAGNOSIS — R569 Unspecified convulsions: Secondary | ICD-10-CM | POA: Diagnosis not present

## 2024-04-05 DIAGNOSIS — R509 Fever, unspecified: Secondary | ICD-10-CM | POA: Diagnosis present

## 2024-04-05 DIAGNOSIS — J111 Influenza due to unidentified influenza virus with other respiratory manifestations: Secondary | ICD-10-CM

## 2024-04-05 DIAGNOSIS — J101 Influenza due to other identified influenza virus with other respiratory manifestations: Secondary | ICD-10-CM | POA: Insufficient documentation

## 2024-04-05 LAB — RESP PANEL BY RT-PCR (RSV, FLU A&B, COVID)  RVPGX2
Influenza A by PCR: NEGATIVE
Influenza B by PCR: POSITIVE — AB
Resp Syncytial Virus by PCR: NEGATIVE
SARS Coronavirus 2 by RT PCR: NEGATIVE

## 2024-04-05 LAB — COMPREHENSIVE METABOLIC PANEL WITH GFR
ALT: 10 U/L (ref 0–44)
AST: 19 U/L (ref 15–41)
Albumin: 4.1 g/dL (ref 3.5–5.0)
Alkaline Phosphatase: 59 U/L (ref 38–126)
Anion gap: 10 (ref 5–15)
BUN: <5 mg/dL — ABNORMAL LOW (ref 6–20)
CO2: 24 mmol/L (ref 22–32)
Calcium: 9.1 mg/dL (ref 8.9–10.3)
Chloride: 103 mmol/L (ref 98–111)
Creatinine, Ser: 0.61 mg/dL (ref 0.44–1.00)
GFR, Estimated: >60 mL/min
Glucose, Bld: 103 mg/dL — ABNORMAL HIGH (ref 70–99)
Potassium: 3.5 mmol/L (ref 3.5–5.1)
Sodium: 137 mmol/L (ref 135–145)
Total Bilirubin: 0.3 mg/dL (ref 0.0–1.2)
Total Protein: 7.4 g/dL (ref 6.5–8.1)

## 2024-04-05 LAB — CBC
HCT: 41.9 % (ref 36.0–46.0)
Hemoglobin: 13.5 g/dL (ref 12.0–15.0)
MCH: 26 pg (ref 26.0–34.0)
MCHC: 32.2 g/dL (ref 30.0–36.0)
MCV: 80.7 fL (ref 80.0–100.0)
Platelets: 186 K/uL (ref 150–400)
RBC: 5.19 MIL/uL — ABNORMAL HIGH (ref 3.87–5.11)
RDW: 12.8 % (ref 11.5–15.5)
WBC: 4.5 K/uL (ref 4.0–10.5)
nRBC: 0 % (ref 0.0–0.2)

## 2024-04-05 LAB — CK: Total CK: 86 U/L (ref 38–234)

## 2024-04-05 LAB — HCG, SERUM, QUALITATIVE: Preg, Serum: NEGATIVE

## 2024-04-05 LAB — CBG MONITORING, ED: Glucose-Capillary: 117 mg/dL — ABNORMAL HIGH (ref 70–99)

## 2024-04-05 MED ORDER — MIDAZOLAM HCL (PF) 2 MG/2ML IJ SOLN: 2.0000 mg | Freq: Once | INTRAMUSCULAR | Status: AC

## 2024-04-05 MED ORDER — PROCHLORPERAZINE EDISYLATE 10 MG/2ML IJ SOLN
10.0000 mg | Freq: Once | INTRAMUSCULAR | Status: AC
  Administered 2024-04-05: 10 mg via INTRAVENOUS
  Filled 2024-04-05: qty 2

## 2024-04-05 MED ORDER — LEVETIRACETAM (KEPPRA) 500 MG/5 ML ADULT IV PUSH
1500.0000 mg | Freq: Once | INTRAVENOUS | Status: AC
  Administered 2024-04-05: 1500 mg via INTRAVENOUS
  Filled 2024-04-05: qty 15

## 2024-04-05 MED ORDER — ACETAMINOPHEN 325 MG PO TABS
650.0000 mg | ORAL_TABLET | Freq: Once | ORAL | Status: AC
  Administered 2024-04-05: 650 mg via ORAL
  Filled 2024-04-05: qty 2

## 2024-04-05 MED ORDER — TOPIRAMATE 25 MG PO TABS
25.0000 mg | ORAL_TABLET | Freq: Once | ORAL | Status: AC
  Administered 2024-04-05: 25 mg via ORAL
  Filled 2024-04-05: qty 1

## 2024-04-05 MED ORDER — KETOROLAC TROMETHAMINE 15 MG/ML IJ SOLN
15.0000 mg | Freq: Once | INTRAMUSCULAR | Status: AC
  Administered 2024-04-05: 15 mg via INTRAVENOUS
  Filled 2024-04-05: qty 1

## 2024-04-05 MED ORDER — MIDAZOLAM HCL 2 MG/2ML IJ SOLN
INTRAMUSCULAR | Status: AC
  Administered 2024-04-05: 2 mg via INTRAVENOUS
  Filled 2024-04-05: qty 4

## 2024-04-05 MED ORDER — LACTATED RINGERS IV BOLUS
1000.0000 mL | Freq: Once | INTRAVENOUS | Status: AC
  Administered 2024-04-05: 1000 mL via INTRAVENOUS

## 2024-04-05 NOTE — ED Triage Notes (Signed)
 Patient BIB EMS for increased seizures, last one two days ago.  Patient has also had cough, fever x three days.  Fentanyl  before arrival for headache.

## 2024-04-05 NOTE — Discharge Instructions (Signed)
 It appears you have refills for your Keppra  and your Topamax  already ordered by your primary doctor at [facility] at [place].  Please pick them up and take them.  You may take Tylenol  turning with ibuprofen  for your flu symptoms.  Try to maintain adequate hydration.  Return for any new or worsening symptoms that are concerning to you.

## 2024-04-05 NOTE — ED Notes (Signed)
 Patient was having a seizure. Patient did not have access. RN pulled 4mg  versed  IM verbal order from Dr. Neysa. When bringing the medication to the bedside, Katheryn, RN, got access and new verbal order to give 2mg  IV. The other 2mg  wasted in pyxis with Francis[employer].

## 2024-04-05 NOTE — ED Provider Notes (Signed)
 " [place] EMERGENCY DEPARTMENT AT [facility] Provider Note   CSN: [id number] Arrival date & time: 04/05/24  9161     Patient presents with: Cough, Fever, and Seizures   Stephanie Mack is a 34 y.o. female.   66 year old with history of seizures presenting emergency department with generalized malaise, subjective fevers, body aches, congestion, nausea as well as increased seizures.  Symptoms started 3 days ago.  She also notes that she has not had her Keppra  essentially for the past 2 days.  Had had some seizures yesterday, mother thinks she had another 1 this morning.  Patient currently alert and oriented x 3.  Complaining of headache.  Has a history of the same, gradual onset.  No neurodeficits.   Cough Associated symptoms: fever   Fever Associated symptoms: cough   Seizures      Prior to Admission medications  Medication Sig Start Date End Date Taking? Authorizing Provider  amLODipine  (NORVASC ) 5 MG tablet Take 1 tablet (5 mg total) by mouth at bedtime. 01/11/22   Norbert, John C, MD  diazepam (VALIUM) 5 MG tablet Take 5 mg by mouth every 6 (six) hours as needed for anxiety. Patient not taking: Reported on 12/11/2023 02/27/23   [provider]  gabapentin  (NEURONTIN ) 100 MG capsule Take 100 mg by mouth 2 (two) times daily as needed (nerve pain). 10/05/22   [provider]  ibuprofen  (ADVIL ) 800 MG tablet Take 800 mg by mouth 2 (two) times daily as needed for mild pain (pain score 1-3) or moderate pain (pain score 4-6). 10/05/22   [provider]  levETIRAcetam  (KEPPRA  XR) 500 MG 24 hr tablet Take 2 tablets every night 12/11/23   Georjean Darice HERO, MD  mirtazapine (REMERON) 7.5 MG tablet Take 7.5 mg by mouth. 11/15/23   [provider]  potassium chloride  (KLOR-CON ) 10 MEQ tablet Take 1 tablet (10 mEq total) by mouth daily for 7 days. 07/30/23 12/11/23  Mannie Pac T, DO  sertraline  (ZOLOFT ) 50 MG tablet Take 1 tablet (50 mg total) by mouth  daily. Patient not taking: Reported on 12/11/2023 03/19/23   Dean Clarity, MD  topiramate  (TOPAMAX ) 25 MG tablet Take 2 tablets every night 12/11/23   Georjean Darice HERO, MD    Allergies: Penicillin  g    Review of Systems  Constitutional:  Positive for fever.  Respiratory:  Positive for cough.   Neurological:  Positive for seizures.    Updated Vital Signs BP 128/85 (BP Location: Right Arm)   Pulse 76   Temp 98.7 F (37.1 C) (Oral)   Resp 16   SpO2 99%   Physical Exam Vitals and nursing note reviewed.  Constitutional:      General: She is not in acute distress.    Appearance: She is not toxic-appearing.  HENT:     Head: Normocephalic.  Eyes:     Conjunctiva/sclera: Conjunctivae normal.  Cardiovascular:     Rate and Rhythm: Normal rate and regular rhythm.  Pulmonary:     Effort: Pulmonary effort is normal.     Breath sounds: Normal breath sounds.  Abdominal:     General: Abdomen is flat. There is no distension.     Tenderness: There is no abdominal tenderness. There is no guarding or rebound.  Skin:    General: Skin is warm and dry.     Capillary Refill: Capillary refill takes less than 2 seconds.  Neurological:     General: No focal deficit present.     Mental  Status: She is alert and oriented to person, place, and time.  Psychiatric:        Mood and Affect: Mood normal.        Behavior: Behavior normal.     (all labs ordered are listed, but only abnormal results are displayed) Labs Reviewed  RESP PANEL BY RT-PCR (RSV, FLU A&B, COVID)  RVPGX2 - Abnormal; Notable for the following components:      Result Value   Influenza B by PCR POSITIVE (*)    All other components within normal limits  CBC - Abnormal; Notable for the following components:   RBC 5.19 (*)    All other components within normal limits  COMPREHENSIVE METABOLIC PANEL WITH GFR - Abnormal; Notable for the following components:   Glucose, Bld 103 (*)    BUN <5 (*)    All other components within  normal limits  CBG MONITORING, ED - Abnormal; Notable for the following components:   Glucose-Capillary 117 (*)    All other components within normal limits  CK  HCG, SERUM, QUALITATIVE    EKG: None  Radiology: CT Head Wo Contrast Result Date: 04/05/2024 EXAM: CT HEAD WITHOUT 04/05/2024 10:02:10 AM TECHNIQUE: CT of the head was performed without the administration of intravenous contrast. Automated exposure control, iterative reconstruction, and/or weight based adjustment of the mA/kV was utilized to reduce the radiation dose to as low as reasonably achievable. COMPARISON: MRI head 05/07/2023. CLINICAL HISTORY: Seizure disorder, clinical change. FINDINGS: BRAIN AND VENTRICLES: No acute intracranial hemorrhage. No mass effect or midline shift. No extra-axial fluid collection. No evidence of acute infarct. No hydrocephalus. ORBITS: No acute abnormality. SINUSES AND MASTOIDS: Mild mucosal thickening in the ethmoid sinuses and the partially visualized left maxillary sinus. SOFT TISSUES AND SKULL: No acute skull fracture. No acute soft tissue abnormality. IMPRESSION: 1. No acute intracranial abnormality. Electronically signed by: Donnice Mania MD 04/05/2024 10:10 AM EST RP Workstation: [id number]     Procedures   Medications Ordered in the ED  levETIRAcetam  (KEPPRA ) undiluted injection 1,500 mg (1,500 mg Intravenous Given 04/05/24 0937)  topiramate  (TOPAMAX ) tablet 25 mg (25 mg Oral Given 04/05/24 0937)  acetaminophen  (TYLENOL ) tablet 650 mg (650 mg Oral Given 04/05/24 0937)  lactated ringers  bolus 1,000 mL (1,000 mLs Intravenous New Bag/Given 04/05/24 0942)  midazolam  PF (VERSED ) injection 2 mg (2 mg Intravenous Given 04/05/24 0944)  prochlorperazine (COMPAZINE) injection 10 mg (10 mg Intravenous Given 04/05/24 1035)  ketorolac  (TORADOL ) 15 MG/ML injection 15 mg (15 mg Intravenous Given 04/05/24 1035)    Clinical Course as of 04/05/24 1300  Fri Apr 05, 2024  0930 Called to bedside, patient having seizure.   Blinking of the eyes and eyes moving back-and-forth.  Not having tonic-clonic activity.  Given 2 mg of Versed  with stoppage of seizure. [TY]  1016 Influenza B By PCR(!): POSITIVE [TY]  1016 CT Head Wo Contrast IMPRESSION: 1. No acute intracranial abnormality.   [TY]  1258 Patient woke up with is reassuring.  She is flu positive, no significant metabolic derangement.  Normal renal function and liver function.  No leukocytosis to suggest systemic infection or anemia.  CK not indicative of rhabdomyolysis.  She is not pregnant.  CT head without new abnormalities.  Again I suspect her seizure secondary to medication noncompliance.  Was loaded here in the emergency department.  She did have an episode did receive some Versed .  Has been further observed without seizure activity back to baseline per mother who is in room.  She is  feeling improved after migraine cocktail as well.  Will discharge in stable condition and refill her prescriptions for her seizure medications.  Encouraged her to follow-up with her primary doctor. [TY]    Clinical Course User Index [TY] Neysa Caron PARAS, DO                                 Medical Decision Making Is a 34 year old female presenting emergency department for seizures as well as flulike symptoms.  Complex medical history to include seizures, bipolar, chronic headaches, diabetes. She is afebrile nontachycardic, normotensive.  Overall well-appearing on exam, no focal neurodeficits.  I suspect seizure secondary to medication noncompliance as she has not had her medications for the past 2 days.  Symptoms do sound viral in nature.  Will swab for flu/COVID/RSV.  Will get basic screening labs.  Will also get CT head given her complaint of headache.  Will give dose of Keppra  and her Topamax .  Will also give fluids and Tylenol .  Plan to reassess.  See ED course for further MDM final disposition  Amount and/or Complexity of Data Reviewed Independent Historian:     Details:  Mother at bedside noted flulike symptoms and medication noncompliance External Data Reviewed:     Details: Had negative MRI and EEG, follows with neurology.  Further notes post be on the 1000 g of Keppra  twice daily as well as Topamax . Labs: ordered. Decision-making details documented in ED Course. Radiology: ordered and independent interpretation performed. Decision-making details documented in ED Course. ECG/medicine tests: ordered and independent interpretation performed.  Risk OTC drugs. Prescription drug management. Decision regarding hospitalization. Diagnosis or treatment significantly limited by social determinants of health. Risk Details: For health literacy.  Medication noncompliance.      Final diagnoses:  None    ED Discharge Orders     None          Neysa Caron PARAS, DO 04/05/24 1300  "

## 2024-04-17 ENCOUNTER — Emergency Department (HOSPITAL_COMMUNITY)
Admission: EM | Admit: 2024-04-17 | Discharge: 2024-04-18 | Disposition: A | Discharge status: Home / Self Care | Payer: MEDICAID | Attending: Emergency Medicine | Admitting: Emergency Medicine

## 2024-04-17 DIAGNOSIS — E876 Hypokalemia: Secondary | ICD-10-CM | POA: Insufficient documentation

## 2024-04-17 DIAGNOSIS — R509 Fever, unspecified: Secondary | ICD-10-CM

## 2024-04-17 DIAGNOSIS — E119 Type 2 diabetes mellitus without complications: Secondary | ICD-10-CM | POA: Diagnosis not present

## 2024-04-17 DIAGNOSIS — R569 Unspecified convulsions: Secondary | ICD-10-CM | POA: Diagnosis not present

## 2024-04-17 DIAGNOSIS — J069 Acute upper respiratory infection, unspecified: Secondary | ICD-10-CM | POA: Diagnosis not present

## 2024-04-17 DIAGNOSIS — D72829 Elevated white blood cell count, unspecified: Secondary | ICD-10-CM | POA: Insufficient documentation

## 2024-04-17 LAB — CBC
HCT: 40.5 % (ref 36.0–46.0)
Hemoglobin: 12.9 g/dL (ref 12.0–15.0)
MCH: 25.7 pg — ABNORMAL LOW (ref 26.0–34.0)
MCHC: 31.9 g/dL (ref 30.0–36.0)
MCV: 80.8 fL (ref 80.0–100.0)
Platelets: 272 K/uL (ref 150–400)
RBC: 5.01 MIL/uL (ref 3.87–5.11)
RDW: 12.9 % (ref 11.5–15.5)
WBC: 14.5 K/uL — ABNORMAL HIGH (ref 4.0–10.5)
nRBC: 0 % (ref 0.0–0.2)

## 2024-04-17 LAB — COMPREHENSIVE METABOLIC PANEL WITH GFR
ALT: 11 U/L (ref 0–44)
AST: 17 U/L (ref 15–41)
Albumin: 4.2 g/dL (ref 3.5–5.0)
Alkaline Phosphatase: 77 U/L (ref 38–126)
Anion gap: 12 (ref 5–15)
BUN: 6 mg/dL (ref 6–20)
CO2: 22 mmol/L (ref 22–32)
Calcium: 9.2 mg/dL (ref 8.9–10.3)
Chloride: 103 mmol/L (ref 98–111)
Creatinine, Ser: 0.6 mg/dL (ref 0.44–1.00)
GFR, Estimated: >60 mL/min
Glucose, Bld: 121 mg/dL — ABNORMAL HIGH (ref 70–99)
Potassium: 3.4 mmol/L — ABNORMAL LOW (ref 3.5–5.1)
Sodium: 137 mmol/L (ref 135–145)
Total Bilirubin: 0.7 mg/dL (ref 0.0–1.2)
Total Protein: 7.4 g/dL (ref 6.5–8.1)

## 2024-04-17 LAB — HCG, SERUM, QUALITATIVE: Preg, Serum: NEGATIVE

## 2024-04-17 LAB — CBG MONITORING, ED: Glucose-Capillary: 120 mg/dL — ABNORMAL HIGH (ref 70–99)

## 2024-04-17 NOTE — ED Triage Notes (Addendum)
 Patient brought in by EMS from home. Patient reports flu like symptoms. Tested positive for Flu B 1 week ago. States she had 1 seizure today. States she takes her medication as normal. Patient had a seizure with EMS (101.8). EMS gave 650 oral tylenol . 20G IV Lhand

## 2024-04-17 NOTE — ED Notes (Signed)
Patient's grandmother is at bedside.

## 2024-04-18 ENCOUNTER — Emergency Department (HOSPITAL_COMMUNITY): Payer: MEDICAID

## 2024-04-18 LAB — URINALYSIS, W/ REFLEX TO CULTURE (INFECTION SUSPECTED)
Bacteria, UA: NONE SEEN
Bilirubin Urine: NEGATIVE
Glucose, UA: NEGATIVE mg/dL
Hgb urine dipstick: NEGATIVE
Ketones, ur: NEGATIVE mg/dL
Leukocytes,Ua: NEGATIVE
Nitrite: NEGATIVE
Protein, ur: 30 mg/dL — AB
Specific Gravity, Urine: 1.028 (ref 1.005–1.030)
pH: 7 (ref 5.0–8.0)

## 2024-04-18 LAB — URINE DRUG SCREEN
Amphetamines: NEGATIVE
Barbiturates: NEGATIVE
Benzodiazepines: NEGATIVE
Cocaine: NEGATIVE
Fentanyl: NEGATIVE
Methadone Scn, Ur: NEGATIVE
Opiates: NEGATIVE
Tetrahydrocannabinol: POSITIVE — AB

## 2024-04-18 LAB — RESP PANEL BY RT-PCR (RSV, FLU A&B, COVID)  RVPGX2
Influenza A by PCR: NEGATIVE
Influenza B by PCR: NEGATIVE
Resp Syncytial Virus by PCR: NEGATIVE
SARS Coronavirus 2 by RT PCR: NEGATIVE

## 2024-04-18 MED ORDER — POTASSIUM CHLORIDE CRYS ER 20 MEQ PO TBCR
40.0000 meq | EXTENDED_RELEASE_TABLET | Freq: Once | ORAL | Status: AC
  Administered 2024-04-18: 40 meq via ORAL
  Filled 2024-04-18: qty 2

## 2024-04-18 NOTE — Discharge Instructions (Addendum)
 Follow-up closely with your primary care provider. Manage fevers with Motrin  and Tylenol  as directed. Return to the ER for any concerning symptoms.

## 2024-04-18 NOTE — ED Provider Notes (Signed)
 " [place] EMERGENCY DEPARTMENT AT [facility] Provider Note   CSN: [id number] Arrival date & time: 04/17/24  2202     Patient presents with: No chief complaint on file.   Stephanie Mack is a 34 y.o. female.   34 yo female presents via EMS from home for seizure. Patient was with her mother who witnessed the seizure, here with grandmother currently who reports seizure lasted about 5 minutes. Patient reports compliance with her seizure medications. Seen in the ER 2 weeks ago after a seizure and found to have Flu B at that time. Febrile with EMS tonight and 101.8, given 650mg  Tylenol  with EMS, did not bite tongue or loose bladder control. Patient states she has been coughing since having the flu, feels like her cough is getting somewhat worse although no difficulty breathing.        Prior to Admission medications  Medication Sig Start Date End Date Taking? Authorizing Provider  amLODipine  (NORVASC ) 5 MG tablet Take 1 tablet (5 mg total) by mouth at bedtime. 01/11/22   Norbert, John C, MD  diazepam (VALIUM) 5 MG tablet Take 5 mg by mouth every 6 (six) hours as needed for anxiety. Patient not taking: Reported on 12/11/2023 02/27/23   [provider]  gabapentin  (NEURONTIN ) 100 MG capsule Take 100 mg by mouth 2 (two) times daily as needed (nerve pain). 10/05/22   [provider]  ibuprofen  (ADVIL ) 800 MG tablet Take 800 mg by mouth 2 (two) times daily as needed for mild pain (pain score 1-3) or moderate pain (pain score 4-6). 10/05/22   [provider]  levETIRAcetam  (KEPPRA  XR) 500 MG 24 hr tablet Take 2 tablets every night 12/11/23   Georjean Darice HERO, MD  mirtazapine (REMERON) 7.5 MG tablet Take 7.5 mg by mouth. 11/15/23   [provider]  potassium chloride  (KLOR-CON ) 10 MEQ tablet Take 1 tablet (10 mEq total) by mouth daily for 7 days. 07/30/23 12/11/23  Mannie Pac T, DO  sertraline  (ZOLOFT ) 50 MG tablet Take 1 tablet (50 mg total) by mouth  daily. Patient not taking: Reported on 12/11/2023 03/19/23   Dean Clarity, MD  topiramate  (TOPAMAX ) 25 MG tablet Take 2 tablets every night 12/11/23   Georjean Darice HERO, MD    Allergies: Penicillin  g    Review of Systems Negative except as per HPI Updated Vital Signs BP 122/87   Pulse 71   Temp 98 F (36.7 C)   Resp 17   SpO2 98%   Physical Exam Vitals and nursing note reviewed.  Constitutional:      General: She is not in acute distress.    Appearance: She is well-developed. She is not diaphoretic.  HENT:     Head: Normocephalic and atraumatic.     Mouth/Throat:     Mouth: Mucous membranes are moist.  Eyes:     Extraocular Movements: Extraocular movements intact.     Pupils: Pupils are equal, round, and reactive to light.  Cardiovascular:     Rate and Rhythm: Normal rate and regular rhythm.     Heart sounds: Normal heart sounds.  Pulmonary:     Effort: Pulmonary effort is normal.     Breath sounds: Normal breath sounds.  Abdominal:     Palpations: Abdomen is soft.     Tenderness: There is no abdominal tenderness.  Musculoskeletal:     Cervical back: Neck supple.  Skin:    General: Skin is warm and dry.     Findings: No  erythema or rash.  Neurological:     Mental Status: She is alert and oriented to person, place, and time.  Psychiatric:        Behavior: Behavior normal.     (all labs ordered are listed, but only abnormal results are displayed) Labs Reviewed  COMPREHENSIVE METABOLIC PANEL WITH GFR - Abnormal; Notable for the following components:      Result Value   Potassium 3.4 (*)    Glucose, Bld 121 (*)    All other components within normal limits  CBC - Abnormal; Notable for the following components:   WBC 14.5 (*)    MCH 25.7 (*)    All other components within normal limits  URINALYSIS, W/ REFLEX TO CULTURE (INFECTION SUSPECTED) - Abnormal; Notable for the following components:   APPearance HAZY (*)    Protein, ur 30 (*)    All other components  within normal limits  URINE DRUG SCREEN - Abnormal; Notable for the following components:   Tetrahydrocannabinol POSITIVE (*)    All other components within normal limits  CBG MONITORING, ED - Abnormal; Notable for the following components:   Glucose-Capillary 120 (*)    All other components within normal limits  RESP PANEL BY RT-PCR (RSV, FLU A&B, COVID)  [id number]  HCG, SERUM, QUALITATIVE    EKG: None  Radiology: DG Chest 2 View Result Date: 04/18/2024 EXAM: 2 VIEW(S) XRAY OF THE CHEST 04/18/2024 12:58:00 AM COMPARISON: 02/22/2023 CLINICAL HISTORY: cough and fevers. FINDINGS: LUNGS AND PLEURA: No focal pulmonary opacity. No pleural effusion. No pneumothorax. HEART AND MEDIASTINUM: No acute abnormality of the cardiac and mediastinal silhouettes. BONES AND SOFT TISSUES: No acute osseous abnormality. IMPRESSION: 1. No acute process. Electronically signed by: Oneil Devonshire MD 04/18/2024 01:03 AM EST RP Workstation: [id number]     Procedures   Medications Ordered in the ED  potassium chloride  SA (KLOR-CON  M) CR tablet 40 mEq (40 mEq Oral Given 04/18/24 0255)                                    Medical Decision Making Amount and/or Complexity of Data Reviewed Labs: ordered. Radiology: ordered.  Risk Prescription drug management.   This patient presents to the ED for concern of seizure, fever, this involves an extensive number of treatment options, and is a complaint that carries with it a high risk of complications and morbidity.  The differential diagnosis includes but not limited to UTI, PNA, viral illness, electrolyte/metabolic   Co morbidities / Chronic conditions that complicate the patient evaluation  DM, seizure, , ADHD, bipolar do,    Additional history obtained:  Additional history obtained from EMR External records from outside source obtained and reviewed including prior labs and imaging on file    Lab Tests:  I Ordered, and personally interpreted labs.  The  pertinent results include:  hcg negative, CBC with leukocytosis at 14.5. CMP with mild hypokalemia at 3.4. UA with protein, no evidence of infection. RVP negative for covid/flu/rsv.    Imaging Studies ordered:  I ordered imaging studies including CXR  I independently visualized and interpreted imaging which showed no acute process  I agree with the radiologist interpretation   Cardiac Monitoring: / EKG:  The patient was maintained on a cardiac monitor.  I personally viewed and interpreted the cardiac monitored which showed an underlying rhythm of: sinus rhythm, rate 95   Problem List / ED Course / Critical interventions /  Medication management  34 yo female presents via EMS for seizure. Febrile on EMS arrival and provided with tylenol . Reports compliance with her meds. Work up reassuring. No source for fever identified. At baseline mental status. Recommend close follow up with PCP. Return to ER for any concerns.  I ordered medication including kdur   Reevaluation of the patient after these medicines showed that the patient stable I have reviewed the patients home medicines and have made adjustments as needed   Social Determinants of Health:  Lives with friend, has family and PCP locally    Test / Admission - Considered:  Stable for dc      Final diagnoses:  Seizure (HCC)  Fever, unspecified  Viral URI with cough    ED Discharge Orders     None          Beverley Leita DELENA DEVONNA 04/18/24 0331    Griselda Norris, MD 04/18/24 [phone]  "

## 2024-04-21 NOTE — Progress Notes (Unsigned)
 "  NEUROLOGY FOLLOW UP OFFICE NOTE  Stephanie Mack [id number] 1991-03-13  HISTORY OF PRESENT ILLNESS: I had the pleasure of seeing Stephanie Mack in follow-up in the neurology clinic on 04/22/2024.  She is alone in the office today. The patient was last seen 4 months ago for seizures. MRI brain and 41-hour ambulatory EEG normal.  On her last visit, she continued to report staring spells and migraines, sometimes seeing flashes of light. Topiramate  increased to 50mg  at bedtime (25mg  2 tabs at bedtime), in addition to Levetiracetam  ER 1000mg  at bedtime (500mg : 2 tabs at bedtime). She was advised to see her eye doctor as well. Records were reviewed. She was in the ER on 03/23/24 for seizure, reportedly with shaking and no post-ictal symptoms. During ER exam, she started staring off and became nonresponsive, her eyes were closed and appeared to be twitching. Cbg checked within normal limits. Patient kept her arms elevated when passively raised. Eyes stopped twitching when I would try to open them. No formal diagnosis of pseudoseizures although her exam is concerning for this, Ativan  given. She was in the ER for the flu on 1/2 and reported seizures, as well as on 1/14 also with febrile illness (viral panel negative).     The patient had a virtual video visit on 12/11/2023. She was last seen in the neurology clinic 3 months ago for seizures. MRI brain and 41-hour ambulatory EEG normal. On her last visit, she continued to report staring spells. No convulsions since 07/2023. She had difficulty with BID dosing of Levetiracetam  and was switched to Levetiracetam  ER 1000mg  daily. She was also reporting more migraines, Topiramate  25mg  daily was added. She reports that she takes an additional LEV if she is feeling sick. She continues to report staring spells, sometimes she sees flashes of light. Last staring spell was a week ago. She feels the flashing lights are getting bad, she always sees it on the left side and states  it is only affecting the left eye. The headaches are not very often, occurring if she misses a dose of medication. She has had 2 headaches in the past 2 days. She feels the medications make her tired. She reports forgetfulness. She only takes gabapentin  as needed when her bones start hurting.    History on Initial Assessment 04/03/2023: This is a 34 year old right-handed woman with a history of hypertension,bipolar disorder, depression, presenting for evaluation of seizures. They report she had a febrile seizure at age 34 and was not on seizure medication, seizure-free until a year ago when she started having recurrent episodes where her hands start sweating and tingling, her mouth and tongue get dry. She gets nauseated then wakes up on the floor. Her grandmother reports her eyes start to flutter, she stops talking then leans over/slumps down with drooling, shaking/jerking of her lips and hands, unresponsive for 5-6 minutes. She opens her eyes after and cannot talk, cold rags of her face really help. She tastes blood after an episode. No tongue bite, incontinence, focal weakness. They note that a lot of stress brings it on. She reports gaps in time, her grandmother notes staring episodes, she gets real confused.  She reports jerking in her hands occurring separately. She has been to the ER several times since 10/15 for these episodes. She had a witnessed episode on 10/15 where she became unresponsive in the chair, with drooling and eyelid fluttering for 1-2 minutes. She was started on Levetiracetam  500mg  BID and reports last episode was 03/19/23. She  has some drowsiness and possibly worsening depression. She reports taking Gabapentin  for the past 6 months for bone pain. She is on Diazepam prn for anxiety, taking it around once a week. Bloodwork in the ER was normal, UDS positive for benzodiazepines and THC. I personally reviewed head CT without contrast which did not show any changes.  She has frequent  headaches occurring around once a week, usually when she gets nervous, sick, or when BP is high. She has a pressure in the frontal region with nausea, sensitivity to lights and sounds. Her mother and maternal grandmother have migraines. She denies any dizziness, diplopia, dysarthria/dysphagia, neck pain, bladder dysfunction. She has occasional back pain and a lot of constipation. Mood is depressed, her grandmother reports she gets depressed easily (even before the episodes started), however this has worsened with recent events. She used to be on Sertraline  prescribed by [facility] but this was restarted in the ER. They report this makes her shake. She has been scared at night and sleeps during the day, getting 3-4 hours of sleep. No alcohol. She lives with her grandmother, mother, and 2 children (ages 1 and 15). No pregnancy plans, she is s/p tubal ligation. She does not drive.   Her paternal aunt has seizures. She had a febrile seizure at age 44. She had a normal birth and early development.  There is no history of CNS infections such as meningitis/encephalitis, significant traumatic brain injury, neurosurgical procedures.  Diagnostic Data: MRI brain with and without contrast done 05/2023 was normal, hippocampi symmetric with no abnormal signal or enhancement seen.   1-hour EEG in 04/2023 normal 41-hour ambulatory EEG in 05/2023 normal, typical events were not reported.  PAST MEDICAL HISTORY: Past Medical History:  Diagnosis Date   ADHD (attention deficit hyperactivity disorder)    Anemia    Anxiety    Bipolar 1 disorder (HCC)    Chlamydia 05/31/2010   Chronic constipation    Complication of anesthesia    Depression    depression   Dysrhythmia    Fibroid    GERD (gastroesophageal reflux disease)    with pregnancy   Gestational diabetes    current pregnancy   Gonorrhea contact, treated    Headache    Hypertension    Infection    UTI   PONV (postoperative nausea and vomiting)    Pregnancy  induced hypertension    previous pregnancy   Pseudocyesis 2013   Seen in [facility] for percieved FM, abd distension. Normal exam.    Seizures (HCC)    Sickle cell trait    Type 2 diabetes mellitus (HCC) 02/13/2017    MEDICATIONS: Medications Ordered Prior to Encounter[1]  ALLERGIES: Allergies[2]  FAMILY HISTORY: Family History  Problem Relation Age of Onset   Kidney disease Mother    Lupus Mother    Vision loss Father    Hypertension Father    Cancer Sister    Asthma Brother    Diabetes Maternal Grandfather    Heart disease Neg Hx     SOCIAL HISTORY: Social History   Socioeconomic History   Marital status: Single    Spouse name: Not on file   Number of children: Not on file   Years of education: Not on file   Highest education level: 12th grade  Occupational History   Not on file  Tobacco Use   Smoking status: Never    Passive exposure: Never   Smokeless tobacco: Never  Vaping Use   Vaping status: Never Used  Substance  and Sexual Activity   Alcohol use: No    Alcohol/week: 0.0 standard drinks of alcohol   Drug use: No   Sexual activity: Not Currently    Partners: Male    Birth control/protection: None  Other Topics Concern   Not on file  Social History Narrative   Single, one daughter born 2016   Are you right handed or left handed? Right    Are you currently employed ? No    What is your current occupation? NA   Do you live at home alone? no   Who lives with you? Family    What type of home do you live in: 1 story or 2 story?  2 story with steps              Social Drivers of Health   Tobacco Use: Low Risk (03/23/2024)   Patient History    Smoking Tobacco Use: Never    Smokeless Tobacco Use: Never    Passive Exposure: Never  Financial Resource Strain: Low Risk (08/07/2022)   Overall Financial Resource Strain (CARDIA)    Difficulty of Paying Living Expenses: Not hard at all  Food Insecurity: No Food Insecurity (08/07/2022)   Hunger Vital Sign     Worried About Running Out of Food in the Last Year: Never true    Ran Out of Food in the Last Year: Never true  Transportation Needs: No Transportation Needs (08/07/2022)   PRAPARE - Administrator, Civil Service (Medical): No    Lack of Transportation (Non-Medical): No  Physical Activity: Insufficiently Active (08/07/2022)   Exercise Vital Sign    Days of Exercise per Week: 2 days    Minutes of Exercise per Session: 20 min  Stress: No Stress Concern Present (08/07/2022)   [employer] of Occupational Health - Occupational Stress Questionnaire    Feeling of Stress : Not at all  Social Connections: Unknown (08/07/2022)   Social Connection and Isolation Panel    Frequency of Communication with Friends and Family: More than three times a week    Frequency of Social Gatherings with Friends and Family: Never    Attends Religious Services: 1 to 4 times per year    Active Member of [employer] or Organizations: No    Attends Banker Meetings: Not on file    Marital Status: Patient declined  Intimate Partner Violence: Not on file  Depression (PHQ2-9): Medium Risk (09/30/2022)   Depression (PHQ2-9)    PHQ-2 Score: 8  Alcohol Screen: Not on file  Housing: Low Risk (08/07/2022)   Housing    Last Housing Risk Score: 0  Utilities: Not on file  Health Literacy: Not on file     PHYSICAL EXAM: There were no vitals filed for this visit. General: No acute distress Head:  Normocephalic/atraumatic Skin/Extremities: No rash, no edema Neurological Exam: alert and oriented to person, place, and time. No aphasia or dysarthria. Fund of knowledge is appropriate.  Recent and remote memory are intact.  Attention and concentration are normal.   Cranial nerves: Pupils equal, round. Extraocular movements intact with no nystagmus. Visual fields full.  No facial asymmetry.  Motor: Bulk and tone normal, muscle strength 5/5 throughout with no pronator drift.   Finger to nose testing intact.  Gait  narrow-based and steady, able to tandem walk adequately.  Romberg negative.   IMPRESSION: This is a 34 yo RH woman with a history of hypertension,bipolar disorder, depression, with recurrent seizures described as staring episodes and  convulsions. MRI brain and ambulatory EEG normal, however typical events were not captured. She denies any convulsions since 07/2023 but continues to report staring spells. She has had more seizures since last visit and now takes Levetiracetam  1000mg  BID, no convulsions since 07/2023 but they continue to report staring spells. She also reports flashes of light however states that it only affects the left eye. Referral to [facility] will be sent. Increase Topiramate  to 50mg  at bedtime (25mg : 2 tabs at bedtime). Continue Levetiracetam  ER 1000mg  at bedtime (500mg : 2 tabs at bedtime). She was advised to continue symptom calendar as we adjust medications. If symptoms continue, we may consider inpatient vEEG monitoring for characterization. She does not drive. Follow-up in 3-4 months, call for any changes.    Thank you for allowing me to participate in *** care.  Please do not hesitate to call for any questions or concerns.  The duration of this appointment visit was *** minutes of face-to-face time with the patient.  Greater than 50% of this time was spent in counseling, explanation of diagnosis, planning of further management, and coordination of care.   Darice Shivers, M.D.   CC: ***     [1]  Current Outpatient Medications on File Prior to Visit  Medication Sig Dispense Refill   amLODipine  (NORVASC ) 5 MG tablet Take 1 tablet (5 mg total) by mouth at bedtime. 90 tablet 3   diazepam (VALIUM) 5 MG tablet Take 5 mg by mouth every 6 (six) hours as needed for anxiety. (Patient not taking: Reported on 12/11/2023)     gabapentin  (NEURONTIN ) 100 MG capsule Take 100 mg by mouth 2 (two) times daily as needed (nerve pain).     ibuprofen  (ADVIL ) 800 MG tablet Take 800 mg  by mouth 2 (two) times daily as needed for mild pain (pain score 1-3) or moderate pain (pain score 4-6).     levETIRAcetam  (KEPPRA  XR) 500 MG 24 hr tablet Take 2 tablets every night 60 tablet 11   mirtazapine (REMERON) 7.5 MG tablet Take 7.5 mg by mouth.     potassium chloride  (KLOR-CON ) 10 MEQ tablet Take 1 tablet (10 mEq total) by mouth daily for 7 days. 7 tablet 0   sertraline  (ZOLOFT ) 50 MG tablet Take 1 tablet (50 mg total) by mouth daily. (Patient not taking: Reported on 12/11/2023) 30 tablet 0   topiramate  (TOPAMAX ) 25 MG tablet Take 2 tablets every night 60 tablet 11   No current facility-administered medications on file prior to visit.  [2]  Allergies Allergen Reactions   Penicillin  G     itching   "

## 2024-04-22 ENCOUNTER — Encounter: Payer: Self-pay | Admitting: Neurology

## 2024-04-22 ENCOUNTER — Ambulatory Visit: Payer: MEDICAID | Admitting: Neurology

## 2024-04-22 VITALS — BP 149/89 | HR 60 | Ht 67.0 in | Wt 140.0 lb

## 2024-04-22 DIAGNOSIS — R519 Headache, unspecified: Secondary | ICD-10-CM | POA: Diagnosis not present

## 2024-04-22 DIAGNOSIS — F445 Conversion disorder with seizures or convulsions: Secondary | ICD-10-CM | POA: Diagnosis not present

## 2024-04-22 MED ORDER — TOPIRAMATE 100 MG PO TABS: ORAL_TABLET | ORAL | 11 refills | Status: AC

## 2024-04-22 NOTE — Patient Instructions (Signed)
 Good to see you.  Schedule inpatient video EEG study to help determine where the seizures are coming from so we can treat you better   2. Increase Topiramate  to 100mg : take 1 tablet every night  3. Continue Keppra  ER 500mg : take 2 tablets every night for now, we may be stopping this in the future  4. Referral will be sent to [facility] for psychiatry and psychotherapy  5. Follow-up in 4 months, call for any changes

## 2024-04-23 ENCOUNTER — Telehealth: Payer: Self-pay

## 2024-04-23 NOTE — Telephone Encounter (Signed)
 Called patient and made her aware that per [facility] no referral is needed to see them she just needs to call and make an appointment. She understood. I gave her the number to call.

## 2024-05-03 ENCOUNTER — Telehealth: Payer: Self-pay | Admitting: Neurology

## 2024-05-03 MED ORDER — LEVETIRACETAM ER 500 MG PO TB24: ORAL_TABLET | ORAL | 5 refills | Status: AC

## 2024-05-03 NOTE — Telephone Encounter (Signed)
 Pt informed that refill for Keppra  was sent in

## 2024-05-20 ENCOUNTER — Inpatient Hospital Stay (HOSPITAL_COMMUNITY): Admission: RE | Admit: 2024-05-20 | Payer: MEDICAID | Source: Ambulatory Visit | Admitting: Neurology

## 2024-08-20 ENCOUNTER — Ambulatory Visit: Payer: Self-pay | Admitting: Neurology
